# Patient Record
Sex: Male | Born: 1955
Health system: Southern US, Community
[De-identification: ages and names within clinical notes are randomized; demographics above are authoritative.]

## PROBLEM LIST (undated history)

## (undated) DIAGNOSIS — Z950 Presence of cardiac pacemaker: Secondary | ICD-10-CM

## (undated) DIAGNOSIS — Z953 Presence of xenogenic heart valve: Secondary | ICD-10-CM

## (undated) DIAGNOSIS — R911 Solitary pulmonary nodule: Secondary | ICD-10-CM

## (undated) DIAGNOSIS — Z8042 Family history of malignant neoplasm of prostate: Secondary | ICD-10-CM

## (undated) DIAGNOSIS — I5042 Chronic combined systolic (congestive) and diastolic (congestive) heart failure: Secondary | ICD-10-CM

## (undated) DIAGNOSIS — I442 Atrioventricular block, complete: Secondary | ICD-10-CM

## (undated) DIAGNOSIS — M1990 Unspecified osteoarthritis, unspecified site: Secondary | ICD-10-CM

## (undated) DIAGNOSIS — Z951 Presence of aortocoronary bypass graft: Secondary | ICD-10-CM

## (undated) DIAGNOSIS — I4510 Unspecified right bundle-branch block: Secondary | ICD-10-CM

## (undated) DIAGNOSIS — Z8052 Family history of malignant neoplasm of bladder: Secondary | ICD-10-CM

## (undated) DIAGNOSIS — R0600 Dyspnea, unspecified: Secondary | ICD-10-CM

## (undated) DIAGNOSIS — I4892 Unspecified atrial flutter: Secondary | ICD-10-CM

## (undated) DIAGNOSIS — I10 Essential (primary) hypertension: Secondary | ICD-10-CM

## (undated) DIAGNOSIS — I4891 Unspecified atrial fibrillation: Secondary | ICD-10-CM

## (undated) DIAGNOSIS — I493 Ventricular premature depolarization: Secondary | ICD-10-CM

## (undated) DIAGNOSIS — I2510 Atherosclerotic heart disease of native coronary artery without angina pectoris: Secondary | ICD-10-CM

## (undated) DIAGNOSIS — R7989 Other specified abnormal findings of blood chemistry: Secondary | ICD-10-CM

## (undated) DIAGNOSIS — Z923 Personal history of irradiation: Secondary | ICD-10-CM

## (undated) DIAGNOSIS — C8190 Hodgkin lymphoma, unspecified, unspecified site: Secondary | ICD-10-CM

## (undated) DIAGNOSIS — Z800 Family history of malignant neoplasm of digestive organs: Secondary | ICD-10-CM

## (undated) DIAGNOSIS — E039 Hypothyroidism, unspecified: Secondary | ICD-10-CM

## (undated) DIAGNOSIS — I340 Nonrheumatic mitral (valve) insufficiency: Secondary | ICD-10-CM

## (undated) HISTORY — DX: Unspecified right bundle-branch block: I45.10

## (undated) HISTORY — DX: Personal history of irradiation: Z92.3

## (undated) HISTORY — DX: Atrioventricular block, complete: I44.2

## (undated) HISTORY — DX: Family history of malignant neoplasm of prostate: Z80.42

## (undated) HISTORY — DX: Family history of malignant neoplasm of bladder: Z80.52

## (undated) HISTORY — DX: Family history of malignant neoplasm of digestive organs: Z80.0

## (undated) HISTORY — DX: Solitary pulmonary nodule: R91.1

## (undated) HISTORY — DX: Nonrheumatic mitral (valve) insufficiency: I34.0

## (undated) HISTORY — DX: Hodgkin lymphoma, unspecified, unspecified site: C81.90

---
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
## 1994-02-10 HISTORY — PX: LUMBAR LAMINECTOMY: SHX95

## 1998-03-02 ENCOUNTER — Ambulatory Visit (HOSPITAL_COMMUNITY): Admission: RE | Admit: 1998-03-02 | Discharge: 1998-03-02 | Payer: Self-pay | Admitting: Urology

## 2000-02-11 DIAGNOSIS — Z923 Personal history of irradiation: Secondary | ICD-10-CM

## 2000-02-11 DIAGNOSIS — C8190 Hodgkin lymphoma, unspecified, unspecified site: Secondary | ICD-10-CM

## 2000-02-11 HISTORY — DX: Hodgkin lymphoma, unspecified, unspecified site: C81.90

## 2000-02-11 HISTORY — DX: Personal history of irradiation: Z92.3

## 2000-04-23 ENCOUNTER — Ambulatory Visit (HOSPITAL_COMMUNITY): Admission: RE | Admit: 2000-04-23 | Discharge: 2000-04-23 | Payer: Self-pay | Admitting: General Surgery

## 2000-04-24 ENCOUNTER — Ambulatory Visit (HOSPITAL_COMMUNITY): Admission: RE | Admit: 2000-04-24 | Discharge: 2000-04-24 | Payer: Self-pay | Admitting: *Deleted

## 2000-04-24 ENCOUNTER — Encounter (INDEPENDENT_AMBULATORY_CARE_PROVIDER_SITE_OTHER): Payer: Self-pay | Admitting: Specialist

## 2000-04-27 ENCOUNTER — Ambulatory Visit (HOSPITAL_COMMUNITY): Admission: RE | Admit: 2000-04-27 | Discharge: 2000-04-27 | Payer: Self-pay | Admitting: *Deleted

## 2000-04-27 ENCOUNTER — Encounter: Payer: Self-pay | Admitting: *Deleted

## 2000-04-28 ENCOUNTER — Ambulatory Visit (HOSPITAL_COMMUNITY): Admission: RE | Admit: 2000-04-28 | Discharge: 2000-04-28 | Payer: Self-pay | Admitting: *Deleted

## 2000-04-28 ENCOUNTER — Encounter: Payer: Self-pay | Admitting: *Deleted

## 2000-04-30 ENCOUNTER — Encounter: Admission: RE | Admit: 2000-04-30 | Discharge: 2000-07-29 | Payer: Self-pay | Admitting: Radiation Oncology

## 2000-08-12 ENCOUNTER — Ambulatory Visit (HOSPITAL_COMMUNITY): Admission: RE | Admit: 2000-08-12 | Discharge: 2000-08-12 | Payer: Self-pay | Admitting: Hematology & Oncology

## 2000-08-12 ENCOUNTER — Encounter: Payer: Self-pay | Admitting: Hematology & Oncology

## 2000-11-05 ENCOUNTER — Ambulatory Visit: Admission: RE | Admit: 2000-11-05 | Discharge: 2001-02-03 | Payer: Self-pay | Admitting: Radiation Oncology

## 2000-11-06 ENCOUNTER — Ambulatory Visit (HOSPITAL_COMMUNITY): Admission: RE | Admit: 2000-11-06 | Discharge: 2000-11-06 | Payer: Self-pay | Admitting: Radiation Oncology

## 2001-03-19 ENCOUNTER — Ambulatory Visit (HOSPITAL_BASED_OUTPATIENT_CLINIC_OR_DEPARTMENT_OTHER): Admission: RE | Admit: 2001-03-19 | Discharge: 2001-03-19 | Payer: Self-pay | Admitting: General Surgery

## 2004-01-15 ENCOUNTER — Ambulatory Visit (HOSPITAL_COMMUNITY): Admission: RE | Admit: 2004-01-15 | Discharge: 2004-01-15 | Payer: Self-pay | Admitting: Hematology & Oncology

## 2004-01-24 ENCOUNTER — Ambulatory Visit: Payer: Self-pay | Admitting: Hematology & Oncology

## 2004-08-28 ENCOUNTER — Ambulatory Visit: Payer: Self-pay | Admitting: Hematology & Oncology

## 2005-02-13 ENCOUNTER — Ambulatory Visit: Payer: Self-pay | Admitting: Hematology & Oncology

## 2005-07-09 ENCOUNTER — Ambulatory Visit: Payer: Self-pay | Admitting: Internal Medicine

## 2006-02-09 ENCOUNTER — Ambulatory Visit: Payer: Self-pay | Admitting: Hematology & Oncology

## 2006-02-13 LAB — COMPREHENSIVE METABOLIC PANEL
Albumin: 4.7 g/dL (ref 3.5–5.2)
Alkaline Phosphatase: 64 U/L (ref 39–117)
BUN: 16 mg/dL (ref 6–23)
Glucose, Bld: 76 mg/dL (ref 70–99)
Potassium: 4.8 mEq/L (ref 3.5–5.3)
Total Bilirubin: 0.6 mg/dL (ref 0.3–1.2)

## 2006-02-13 LAB — CBC WITH DIFFERENTIAL/PLATELET
Basophils Absolute: 0.1 10*3/uL (ref 0.0–0.1)
Eosinophils Absolute: 0.1 10*3/uL (ref 0.0–0.5)
HCT: 42 % (ref 38.7–49.9)
HGB: 14.6 g/dL (ref 13.0–17.1)
LYMPH%: 13.9 % — ABNORMAL LOW (ref 14.0–48.0)
MCV: 93.7 fL (ref 81.6–98.0)
MONO%: 5.5 % (ref 0.0–13.0)
NEUT#: 6.3 10*3/uL (ref 1.5–6.5)
NEUT%: 78.4 % — ABNORMAL HIGH (ref 40.0–75.0)
Platelets: 336 10*3/uL (ref 145–400)

## 2006-11-04 ENCOUNTER — Ambulatory Visit: Payer: Self-pay | Admitting: Internal Medicine

## 2006-11-04 DIAGNOSIS — Z8571 Personal history of Hodgkin lymphoma: Secondary | ICD-10-CM | POA: Insufficient documentation

## 2006-11-05 LAB — CONVERTED CEMR LAB
Cholesterol: 170 mg/dL (ref 0–200)
HDL: 25.9 mg/dL — ABNORMAL LOW (ref >39.0)
LDL Cholesterol: 116 mg/dL — ABNORMAL HIGH (ref 0–99)
Total CHOL/HDL Ratio: 6.6
Triglycerides: 139 mg/dL (ref 0–149)
VLDL: 28 mg/dL (ref 0–40)

## 2007-02-09 ENCOUNTER — Ambulatory Visit: Payer: Self-pay | Admitting: Family Medicine

## 2007-02-09 DIAGNOSIS — J018 Other acute sinusitis: Secondary | ICD-10-CM | POA: Insufficient documentation

## 2007-02-17 ENCOUNTER — Ambulatory Visit: Payer: Self-pay | Admitting: Hematology & Oncology

## 2007-03-12 ENCOUNTER — Encounter: Payer: Self-pay | Admitting: Internal Medicine

## 2007-03-12 LAB — CBC WITH DIFFERENTIAL/PLATELET
Basophils Absolute: 0 10*3/uL (ref 0.0–0.1)
Eosinophils Absolute: 0.2 10*3/uL (ref 0.0–0.5)
HCT: 42.7 % (ref 38.7–49.9)
HGB: 15.1 g/dL (ref 13.0–17.1)
LYMPH%: 18.8 % (ref 14.0–48.0)
MCV: 89.3 fL (ref 81.6–98.0)
MONO%: 4.2 % (ref 0.0–13.0)
NEUT#: 4.5 10*3/uL (ref 1.5–6.5)
Platelets: 279 10*3/uL (ref 145–400)

## 2007-03-12 LAB — COMPREHENSIVE METABOLIC PANEL
Albumin: 4.7 g/dL (ref 3.5–5.2)
Alkaline Phosphatase: 64 U/L (ref 39–117)
BUN: 12 mg/dL (ref 6–23)
Glucose, Bld: 120 mg/dL — ABNORMAL HIGH (ref 70–99)
Potassium: 3.9 mEq/L (ref 3.5–5.3)

## 2007-03-12 LAB — LIPID PANEL
Cholesterol: 159 mg/dL (ref 0–200)
Total CHOL/HDL Ratio: 4.8 Ratio

## 2007-03-12 LAB — TSH: TSH: 4.861 u[IU]/mL (ref 0.350–5.500)

## 2008-02-17 ENCOUNTER — Ambulatory Visit: Payer: Self-pay | Admitting: Internal Medicine

## 2008-02-17 DIAGNOSIS — J069 Acute upper respiratory infection, unspecified: Secondary | ICD-10-CM | POA: Insufficient documentation

## 2008-03-02 ENCOUNTER — Ambulatory Visit: Payer: Self-pay | Admitting: Hematology & Oncology

## 2008-03-17 ENCOUNTER — Encounter: Payer: Self-pay | Admitting: Internal Medicine

## 2008-03-17 LAB — LIPID PANEL: Cholesterol: 129 mg/dL (ref 0–200)

## 2008-03-17 LAB — CBC WITH DIFFERENTIAL (CANCER CENTER ONLY)
BASO%: 0.7 % (ref 0.0–2.0)
EOS%: 3.3 % (ref 0.0–7.0)
HCT: 40.4 % (ref 38.7–49.9)
LYMPH%: 17.6 % (ref 14.0–48.0)
MCHC: 33.8 g/dL (ref 32.0–35.9)
MCV: 92 fL (ref 82–98)
MONO#: 0.3 10*3/uL (ref 0.1–0.9)
NEUT%: 73.8 % (ref 40.0–80.0)
Platelets: 292 10*3/uL (ref 145–400)
RDW: 12 % (ref 10.5–14.6)
WBC: 6.7 10*3/uL (ref 4.0–10.0)

## 2008-03-17 LAB — COMPREHENSIVE METABOLIC PANEL
ALT: 23 U/L (ref 0–53)
Albumin: 4.4 g/dL (ref 3.5–5.2)
CO2: 25 mEq/L (ref 19–32)
Calcium: 9.2 mg/dL (ref 8.4–10.5)
Chloride: 104 mEq/L (ref 96–112)
Creatinine, Ser: 1.01 mg/dL (ref 0.40–1.50)
Potassium: 4.4 mEq/L (ref 3.5–5.3)

## 2008-03-17 LAB — PSA: PSA: 0.76 ng/mL (ref 0.10–4.00)

## 2008-03-17 LAB — TSH: TSH: 5.064 u[IU]/mL — ABNORMAL HIGH (ref 0.350–4.500)

## 2008-05-26 ENCOUNTER — Ambulatory Visit (HOSPITAL_COMMUNITY): Admission: RE | Admit: 2008-05-26 | Discharge: 2008-05-26 | Payer: Self-pay | Admitting: Hematology & Oncology

## 2008-10-05 ENCOUNTER — Telehealth: Payer: Self-pay | Admitting: Internal Medicine

## 2008-10-13 ENCOUNTER — Ambulatory Visit: Payer: Self-pay | Admitting: Gastroenterology

## 2008-11-06 ENCOUNTER — Encounter: Payer: Self-pay | Admitting: Gastroenterology

## 2008-11-06 ENCOUNTER — Ambulatory Visit: Payer: Self-pay | Admitting: Gastroenterology

## 2008-11-06 LAB — HM COLONOSCOPY

## 2008-11-07 ENCOUNTER — Encounter: Payer: Self-pay | Admitting: Gastroenterology

## 2009-03-15 ENCOUNTER — Ambulatory Visit: Payer: Self-pay | Admitting: Hematology & Oncology

## 2009-03-16 ENCOUNTER — Encounter: Payer: Self-pay | Admitting: Internal Medicine

## 2009-03-16 LAB — CBC WITH DIFFERENTIAL (CANCER CENTER ONLY)
BASO%: 0.9 % (ref 0.0–2.0)
EOS%: 3.1 % (ref 0.0–7.0)
HCT: 44.8 % (ref 38.7–49.9)
LYMPH%: 25 % (ref 14.0–48.0)
MCH: 31.7 pg (ref 28.0–33.4)
MCHC: 34.2 g/dL (ref 32.0–35.9)
MCV: 93 fL (ref 82–98)
MONO%: 7 % (ref 0.0–13.0)
NEUT%: 64 % (ref 40.0–80.0)
RDW: 11.9 % (ref 10.5–14.6)

## 2009-03-16 LAB — COMPREHENSIVE METABOLIC PANEL
ALT: 29 U/L (ref 0–53)
CO2: 25 mEq/L (ref 19–32)
Creatinine, Ser: 0.99 mg/dL (ref 0.40–1.50)
Total Bilirubin: 0.7 mg/dL (ref 0.3–1.2)

## 2009-03-16 LAB — TSH: TSH: 9.351 u[IU]/mL — ABNORMAL HIGH (ref 0.350–4.500)

## 2009-03-16 LAB — LIPID PANEL
HDL: 32 mg/dL — ABNORMAL LOW (ref >39)
LDL Cholesterol: 70 mg/dL (ref 0–99)
Triglycerides: 207 mg/dL — ABNORMAL HIGH (ref <150)

## 2010-03-03 ENCOUNTER — Encounter: Payer: Self-pay | Admitting: Hematology & Oncology

## 2010-03-14 NOTE — Letter (Signed)
[facility]   Imported By: Maryln Gottron 04/05/2009 14:56:38  _____________________________________________________________________  External Attachment:    Type:   Image     Comment:   External Document

## 2010-05-06 ENCOUNTER — Telehealth: Payer: Self-pay | Admitting: Internal Medicine

## 2010-05-06 MED ORDER — TRAZODONE HCL 50 MG PO TABS: ORAL_TABLET | ORAL | Status: DC

## 2010-05-06 NOTE — Telephone Encounter (Signed)
OK to RF #[id number]

## 2010-05-06 NOTE — Telephone Encounter (Signed)
Please advise 

## 2010-05-06 NOTE — Telephone Encounter (Signed)
Has cpx on 05-27-2010 and would like a new rx for Trazadone to be called in to [facility]. Psyche dr had rx'd this and he no longer sees that dr.

## 2010-05-20 ENCOUNTER — Other Ambulatory Visit (INDEPENDENT_AMBULATORY_CARE_PROVIDER_SITE_OTHER): Payer: BLUE CROSS/BLUE SHIELD | Admitting: Internal Medicine

## 2010-05-20 DIAGNOSIS — Z0000 Encounter for general adult medical examination without abnormal findings: Secondary | ICD-10-CM

## 2010-05-20 LAB — POCT URINALYSIS DIPSTICK
Glucose, UA: NEGATIVE
Ketones, UA: NEGATIVE
Leukocytes, UA: NEGATIVE
Spec Grav, UA: 1.02
Urobilinogen, UA: 0.2

## 2010-05-20 LAB — CBC WITH DIFFERENTIAL/PLATELET
Basophils Relative: 0.3 % (ref 0.0–3.0)
Eosinophils Relative: 1.6 % (ref 0.0–5.0)
HCT: 43.4 % (ref 39.0–52.0)
Hemoglobin: 15.2 g/dL (ref 13.0–17.0)
Lymphs Abs: 1.1 10*3/uL (ref 0.7–4.0)
MCV: 94.5 fl (ref 78.0–100.0)
Monocytes Absolute: 0.3 10*3/uL (ref 0.1–1.0)
Monocytes Relative: 4.3 % (ref 3.0–12.0)
Neutro Abs: 6.2 10*3/uL (ref 1.4–7.7)
WBC: 7.7 10*3/uL (ref 4.5–10.5)

## 2010-05-20 LAB — HEPATIC FUNCTION PANEL
Albumin: 4.2 g/dL (ref 3.5–5.2)
Total Protein: 6.6 g/dL (ref 6.0–8.3)

## 2010-05-20 LAB — LIPID PANEL
HDL: 32.7 mg/dL — ABNORMAL LOW (ref >39.00)
Triglycerides: 158 mg/dL — ABNORMAL HIGH (ref 0.0–149.0)

## 2010-05-20 LAB — BASIC METABOLIC PANEL
BUN: 14 mg/dL (ref 6–23)
CO2: 28 mEq/L (ref 19–32)
GFR: 79.97 mL/min (ref >60.00)

## 2010-05-22 LAB — GLUCOSE, CAPILLARY: Glucose-Capillary: 101 mg/dL — ABNORMAL HIGH (ref 70–99)

## 2010-05-24 ENCOUNTER — Encounter: Payer: Self-pay | Admitting: Internal Medicine

## 2010-05-27 ENCOUNTER — Ambulatory Visit (INDEPENDENT_AMBULATORY_CARE_PROVIDER_SITE_OTHER): Payer: BLUE CROSS/BLUE SHIELD | Admitting: Internal Medicine

## 2010-05-27 ENCOUNTER — Encounter: Payer: Self-pay | Admitting: Internal Medicine

## 2010-05-27 DIAGNOSIS — R011 Cardiac murmur, unspecified: Secondary | ICD-10-CM | POA: Insufficient documentation

## 2010-05-27 DIAGNOSIS — Z0000 Encounter for general adult medical examination without abnormal findings: Secondary | ICD-10-CM

## 2010-05-27 DIAGNOSIS — E895 Postprocedural testicular hypofunction: Secondary | ICD-10-CM

## 2010-05-27 LAB — TESTOSTERONE: Testosterone: 227.84 ng/dL — ABNORMAL LOW (ref 350.00–890.00)

## 2010-05-27 MED ORDER — TRAZODONE HCL 50 MG PO TABS: ORAL_TABLET | ORAL | Status: DC

## 2010-05-27 MED ORDER — TESTOSTERONE 50 MG/5GM (1%) TD GEL: 5.0000 g | Freq: Every day | TRANSDERMAL | Status: DC

## 2010-05-27 NOTE — Patient Instructions (Signed)
Limit your sodium (Salt) intake    It is important that you exercise regularly, at least 20 minutes 3 to 4 times per week.  If you develop chest pain or shortness of breath seek  medical attention.  Return in one year for follow-up   

## 2010-05-27 NOTE — Progress Notes (Signed)
Subjective:    Patient ID: Tyler Hendrix, male    DOB: [birth year], 55 y.o.   MRN: [id number]  HPI  55 year old gentleman who has remote history of Hodgkin's disease. This was initially diagnosed over 10 years ago and he has been released by oncology. He has a history of a slightly impaired serum testosterone level. Very little in the way of ADD or libido issues. 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 He has a history of mild dyslipidemia with a depressed HDL    Review of Systems  Constitutional: Negative for fever, chills, activity change, appetite change and fatigue.  HENT: Negative for hearing loss, ear pain, congestion, rhinorrhea, sneezing, mouth sores, trouble swallowing, neck pain, neck stiffness, dental problem, voice change, sinus pressure and tinnitus.   Eyes: Negative for photophobia, pain, redness and visual disturbance.  Respiratory: Negative for apnea, cough, choking, chest tightness, shortness of breath and wheezing.   Cardiovascular: Negative for chest pain, palpitations and leg swelling.  Gastrointestinal: Negative for nausea, vomiting, abdominal pain, diarrhea, constipation, blood in stool, abdominal distention, anal bleeding and rectal pain.  Genitourinary: Negative for dysuria, urgency, frequency, hematuria, flank pain, decreased urine volume, discharge, penile swelling, scrotal swelling, difficulty urinating, genital sores and testicular pain.  Musculoskeletal: Negative for myalgias, back pain, joint swelling, arthralgias and gait problem.  Skin: Negative for color change, rash and wound.  Neurological: Negative for dizziness, tremors, seizures, syncope, facial asymmetry, speech difficulty, weakness, light-headedness, numbness and headaches.  Hematological: Negative for adenopathy. Does not bruise/bleed easily.  Psychiatric/Behavioral: Negative for suicidal ideas, hallucinations, behavioral problems, confusion, sleep  disturbance, self-injury, dysphoric mood, decreased concentration and agitation. The patient is not nervous/anxious.        Objective:   Physical Exam  Constitutional: He appears well-developed and well-nourished.  HENT:  Head: Normocephalic and atraumatic.  Right Ear: External ear normal.  Left Ear: External ear normal.  Nose: Nose normal.  Mouth/Throat: Oropharynx is clear and moist.  Eyes: Conjunctivae and EOM are normal. Pupils are equal, round, and reactive to light. No scleral icterus.  Neck: Normal range of motion. Neck supple. No JVD present. No thyromegaly present.  Cardiovascular: Normal rate, regular rhythm and intact distal pulses.  Exam reveals no gallop and no friction rub.   Murmur heard.      Grade 2/6 systolic murmur loudest  at the primary aortic area with radiation to the right supraclavicular area  Pulmonary/Chest: Effort normal and breath sounds normal. He exhibits no tenderness.  Abdominal: Soft. Bowel sounds are normal. He exhibits no distension and no mass. There is no tenderness.  Genitourinary: Prostate normal and penis normal.  Musculoskeletal: Normal range of motion. He exhibits no edema and no tenderness.  Lymphadenopathy:    He has no cervical adenopathy.  Neurological: He is alert. He has normal reflexes. No cranial nerve deficit. Coordination normal.  Skin: Skin is warm and dry. No rash noted.  Psychiatric: He has a normal mood and affect. His behavior is normal.          Assessment & Plan:   Annual clinical examination History of Hodgkin's disease clinically stable Systolic murmur. Will check a 2-D echocardiogram Mild dyslipidemia. Aerobic exercise regimen encouraged

## 2010-05-28 ENCOUNTER — Telehealth: Payer: Self-pay

## 2010-05-28 NOTE — Telephone Encounter (Signed)
Please start pre auth for androgel - dx low testoserone

## 2010-05-28 NOTE — Telephone Encounter (Signed)
Spoke with pt about low test

## 2010-05-28 NOTE — Progress Notes (Signed)
Quick Note:  Spoke with pt - informed of lab - will start pre auth ppwk for androgel ______

## 2010-06-07 ENCOUNTER — Ambulatory Visit (HOSPITAL_COMMUNITY): Payer: BC Managed Care – PPO | Attending: Internal Medicine | Admitting: Radiology

## 2010-06-07 DIAGNOSIS — R011 Cardiac murmur, unspecified: Secondary | ICD-10-CM | POA: Insufficient documentation

## 2010-06-10 ENCOUNTER — Other Ambulatory Visit: Payer: Self-pay

## 2010-06-10 MED ORDER — TESTOSTERONE 50 MG/5GM (1%) TD GEL: 5.0000 g | Freq: Every day | TRANSDERMAL | Status: DC

## 2010-06-10 NOTE — Telephone Encounter (Signed)
Called into [facility]. KIK

## 2010-06-10 NOTE — Progress Notes (Signed)
Quick Note:  Spoke with pt about results - doing fine. KIK ______

## 2010-06-28 NOTE — Op Note (Signed)
[place]. [facility]  Patient:    Tyler Hendrix, Tyler Hendrix Visit Number: [id number] MRN: [id number]          Service Type: DSU Location: [facility] Attending Physician:  Tempie Donning Dictated by:   Gita Kudo, M.D. Proc. Date: 03/19/01 Admit Date:  03/19/2001 Discharge Date: 03/19/2001   CC:         Gordy Savers, M.D. [facility] R. Myna Hidalgo, M.D.   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  PROCEDURE:  Removal of indwelling venous access system.  SURGEON:  Gita Kudo, M.D.  ANESTHESIA:  MAC, IV sedation, local 1% Xylocaine.  INDICATIONS:  This is a 55 year old man who had a left neck biopsy by myself showing lymphoma and then placement of a Port-A-Cath for chemotherapy.  He has completed his radiation and chemotherapy and is ready for the catheter to be removed.  OPERATIVE FINDINGS:  The catheter was removed intact.  There were no complications.  DESCRIPTION OF PROCEDURE:  Under satisfactory intravenous sedation, the patient was placed in the proper position and prepped and draped in a standard fashion.  1% Xylocaine was infiltrated.  The previous transverse incision was reopened down to the pocket containing the Port-A-Cath.  Then careful dissection was used to cut the sutures and also to dissect the tissue that had grown in through the holes in the flange.  When finished, the reservoir was free, and the patient was placed in the upright position and the catheter and attached reservoir removed intact without any complications.  The wound was made hemostatic by cautery and lavaged with saline.  It was closed in layers of 3-0 Vicryl and Steri-Strips.  Sterile absorbant compressive dressing applied, and the patient went to the recovery room from the operating room in good condition  without complications. Dictated by:   Gita Kudo, M.D. Attending Physician:  Tempie Donning DD:  03/19/01 TD:  03/22/01 Job: [id number]

## 2010-06-28 NOTE — Op Note (Signed)
[place]. [facility]  Patient:    Tyler Hendrix, Tyler Hendrix                     MRN: [id number] Proc. Date: 04/24/00 Adm. Date:  [id number] Attending:  Craig Staggers CC:         Gordy Savers, M.D.  Dolan Amen, M.D.   Operative Report  PREOPERATIVE DIAGNOSIS:  Left neck masses, possible lymphoma.  POSTOPERATIVE DIAGNOSIS:  Left neck masses, possible lymphoma.  PROCEDURE:  Excisional biopsy, left neck lymph nodes.  SURGEON:  Gita Kudo, M.D.  ANESTHESIA:  MAC.  CLINICAL SUMMARY:  A 55 year old male with self-noted swelling in his neck of approximately three weeks duration.  A chest x-ray showed no definite abnormality but CT scan consistent with lymphoma.  Physical examination showed markedly abnormal, matted nodes in the left neck.  OPERATIVE FINDINGS:  The patient had firm, rubbery but not rock-hard nodes that were matted together in the left neck.  Clinically they appeared like lymphoma.  I removed two very large nodes, each at least 3 cm in size.  Many more were felt but not removed.  DESCRIPTION OF PROCEDURE:  Under satisfactory general anesthesia, the patient was positioned and prepped and draped in the standard fashion.  A transverse incision was made in a skin crease, somewhat obliquely, and then carried down through the platysma and superficial fascia.  Good exposure was obtained using self-retaining retractors and careful dissection sharply, with the Bovie, and by finger dissection allowed me to free up a large lymph node.  It was excised using cautery for good hemostasis.  It was placed in a saline sponge and sent immediately to pathology after notifying that department.  After that was sent, another lymph node, deeper to this, was removed in a similar fashion. Care was taken not to injure any large vessels or nodes.  Bleeding was controlled by cautery and then the wound closed in layers with interrupted 3-0 and 4-0  Vicryl, followed by Steri-Strips for skin.  Marcaine 20 cc was infiltrated for postoperative analgesia.  There were no complications, and he went to the recovery room in good condition. DD:  04/24/00 TD:  04/25/00 Job: [id number]

## 2011-01-09 ENCOUNTER — Other Ambulatory Visit: Payer: Self-pay | Admitting: Internal Medicine

## 2011-01-10 ENCOUNTER — Other Ambulatory Visit: Payer: Self-pay

## 2011-01-10 MED ORDER — TESTOSTERONE 50 MG/5GM (1%) TD GEL: 5.0000 g | Freq: Every day | TRANSDERMAL | Status: DC

## 2011-10-17 ENCOUNTER — Encounter: Payer: Self-pay | Admitting: Internal Medicine

## 2011-10-17 ENCOUNTER — Ambulatory Visit (INDEPENDENT_AMBULATORY_CARE_PROVIDER_SITE_OTHER): Payer: BC Managed Care – PPO | Admitting: Internal Medicine

## 2011-10-17 VITALS — BP 122/84 | Temp 98.7°F | Wt 187.0 lb

## 2011-10-17 DIAGNOSIS — Z0000 Encounter for general adult medical examination without abnormal findings: Secondary | ICD-10-CM

## 2011-10-17 DIAGNOSIS — Z8571 Personal history of Hodgkin lymphoma: Secondary | ICD-10-CM

## 2011-10-17 LAB — CBC WITH DIFFERENTIAL/PLATELET
Eosinophils Relative: 1.4 % (ref 0.0–5.0)
Lymphocytes Relative: 11.9 % — ABNORMAL LOW (ref 12.0–46.0)
MCV: 94.6 fl (ref 78.0–100.0)
Monocytes Absolute: 0.5 10*3/uL (ref 0.1–1.0)
Monocytes Relative: 6.2 % (ref 3.0–12.0)
Neutrophils Relative %: 80.2 % — ABNORMAL HIGH (ref 43.0–77.0)
Platelets: 279 10*3/uL (ref 150.0–400.0)
RBC: 4.49 Mil/uL (ref 4.22–5.81)
WBC: 8.6 10*3/uL (ref 4.5–10.5)

## 2011-10-17 LAB — PSA: PSA: 0.98 ng/mL (ref 0.10–4.00)

## 2011-10-17 LAB — TSH: TSH: 4.82 u[IU]/mL (ref 0.35–5.50)

## 2011-10-17 LAB — COMPREHENSIVE METABOLIC PANEL
Albumin: 4.2 g/dL (ref 3.5–5.2)
BUN: 14 mg/dL (ref 6–23)
CO2: 28 mEq/L (ref 19–32)
Calcium: 9.5 mg/dL (ref 8.4–10.5)
Chloride: 104 mEq/L (ref 96–112)
GFR: 74.52 mL/min (ref >60.00)
Glucose, Bld: 94 mg/dL (ref 70–99)
Potassium: 4.8 mEq/L (ref 3.5–5.1)
Sodium: 141 mEq/L (ref 135–145)
Total Protein: 6.9 g/dL (ref 6.0–8.3)

## 2011-10-17 MED ORDER — TESTOSTERONE 50 MG/5GM (1%) TD GEL: 5.0000 g | Freq: Every day | TRANSDERMAL | Status: DC

## 2011-10-17 MED ORDER — TRAZODONE HCL 50 MG PO TABS: 50.0000 mg | ORAL_TABLET | Freq: Every day | ORAL | Status: DC

## 2011-10-17 NOTE — Patient Instructions (Signed)
It is important that you exercise regularly, at least 20 minutes 3 to 4 times per week.  If you develop chest pain or shortness of breath seek  medical attention.  Return in one year for follow-up   

## 2011-10-17 NOTE — Progress Notes (Signed)
Subjective:    Patient ID: Tyler Hendrix, male    DOB: 1955/06/18, 56 y.o.   MRN: [id number]  HPI  56 year old patient who is seen today for followup. He has a remote history of Hodgkin's  Disease and has been in remission for a number of years. He was released by his oncologist at least 3 years ago. Had a normal PET scan in 2010. He is seen here today basically for some medication refills. He has a mild cold symptoms are improving. No constitutional complaints. He generally feels well Past Medical History  Diagnosis Date  . Hodgkin disease     History   Social History  . Marital Status: Married    Spouse Name: N/A    Number of Children: N/A  . Years of Education: N/A   Occupational History  . Not on file.   Social History Main Topics  . Smoking status: Never Smoker   . Smokeless tobacco: Never Used  . Alcohol Use: Yes  . Drug Use: No  . Sexually Active: Not on file   Other Topics Concern  . Not on file   Social History Narrative  . No narrative on file    Past Surgical History  Procedure Date  . Lumbar laminectomy   . Knee surgery     Family History  Problem Relation Age of Onset  . Cancer      prostate father hx    No Known Allergies  Current Outpatient Prescriptions on File Prior to Visit  Medication Sig Dispense Refill  . testosterone (ANDROGEL) 50 MG/5GM GEL Place 5 g onto the skin daily.  90 Package  4  . traZODone (DESYREL) 50 MG tablet TAKE 1/2 TABLET AS NEEDED  90 tablet  0    BP 122/84  Temp 98.7 F (37.1 C) (Oral)  Wt 187 lb (84.823 kg)        Review of Systems  Constitutional: Negative for fever, chills, appetite change and fatigue.  HENT: Positive for congestion. Negative for hearing loss, ear pain, sore throat, trouble swallowing, neck stiffness, dental problem, voice change and tinnitus.   Eyes: Negative for pain, discharge and visual disturbance.  Respiratory: Positive for cough. Negative for chest tightness, wheezing and  stridor.   Cardiovascular: Negative for chest pain, palpitations and leg swelling.  Gastrointestinal: Negative for nausea, vomiting, abdominal pain, diarrhea, constipation, blood in stool and abdominal distention.  Genitourinary: Negative for urgency, hematuria, flank pain, discharge, difficulty urinating and genital sores.  Musculoskeletal: Negative for myalgias, back pain, joint swelling, arthralgias and gait problem.  Skin: Negative for rash.  Neurological: Negative for dizziness, syncope, speech difficulty, weakness, numbness and headaches.  Hematological: Negative for adenopathy. Does not bruise/bleed easily.  Psychiatric/Behavioral: Negative for behavioral problems and dysphoric mood. The patient is not nervous/anxious.        Objective:   Physical Exam  Constitutional: He is oriented to person, place, and time. He appears well-developed.  HENT:  Head: Normocephalic.  Right Ear: External ear normal.  Left Ear: External ear normal.  Eyes: Conjunctivae and EOM are normal.  Neck: Normal range of motion.  Cardiovascular: Normal rate and normal heart sounds.   Pulmonary/Chest: Breath sounds normal.  Abdominal: Bowel sounds are normal.  Musculoskeletal: Normal range of motion. He exhibits no edema and no tenderness.  Neurological: He is alert and oriented to person, place, and time.  Psychiatric: He has a normal mood and affect. His behavior is normal.  Assessment & Plan:  History of Hodgkin's  disease. Remains in clinical remission we'll check updated labs Testosterone deficiency  Recheck 1 year or as needed

## 2011-10-20 NOTE — Progress Notes (Signed)
Quick Note:  Attempt to call- Vm - LMTCB if questions - labs stable ______ 

## 2011-12-23 ENCOUNTER — Ambulatory Visit (INDEPENDENT_AMBULATORY_CARE_PROVIDER_SITE_OTHER): Payer: BC Managed Care – PPO | Admitting: Internal Medicine

## 2011-12-23 DIAGNOSIS — Z23 Encounter for immunization: Secondary | ICD-10-CM

## 2012-05-25 ENCOUNTER — Other Ambulatory Visit: Payer: Self-pay | Admitting: Internal Medicine

## 2012-10-19 ENCOUNTER — Other Ambulatory Visit: Payer: Self-pay | Admitting: Internal Medicine

## 2012-10-30 ENCOUNTER — Other Ambulatory Visit: Payer: Self-pay | Admitting: Internal Medicine

## 2012-11-08 ENCOUNTER — Ambulatory Visit (INDEPENDENT_AMBULATORY_CARE_PROVIDER_SITE_OTHER): Payer: BC Managed Care – PPO | Admitting: *Deleted

## 2012-11-08 DIAGNOSIS — Z23 Encounter for immunization: Secondary | ICD-10-CM

## 2012-11-08 DIAGNOSIS — Z2911 Encounter for prophylactic immunotherapy for respiratory syncytial virus (RSV): Secondary | ICD-10-CM

## 2013-01-31 ENCOUNTER — Other Ambulatory Visit: Payer: Self-pay | Admitting: Internal Medicine

## 2013-05-02 ENCOUNTER — Other Ambulatory Visit: Payer: Self-pay | Admitting: Internal Medicine

## 2013-05-10 ENCOUNTER — Other Ambulatory Visit: Payer: Self-pay | Admitting: Internal Medicine

## 2013-05-11 ENCOUNTER — Telehealth: Payer: Self-pay | Admitting: Internal Medicine

## 2013-05-11 NOTE — Telephone Encounter (Signed)
Pt is out of ANDROGEL 50 MG/5GM GEL Pt has made an appt for 4/21. Can you refill until then? [facility]

## 2013-05-12 ENCOUNTER — Other Ambulatory Visit: Payer: Self-pay | Admitting: Internal Medicine

## 2013-05-12 NOTE — Telephone Encounter (Signed)
Please RF

## 2013-05-12 NOTE — Telephone Encounter (Signed)
Spoke to pt told him Rx for Androgel was faxed to pharmacy. Pt verbalized understanding. Rx faxed.

## 2013-05-12 NOTE — Telephone Encounter (Signed)
Okay to refill, pt last seen 2013, but has an appointment 4/21.

## 2013-05-30 ENCOUNTER — Ambulatory Visit: Payer: BC Managed Care – PPO | Admitting: Internal Medicine

## 2013-05-31 ENCOUNTER — Ambulatory Visit (INDEPENDENT_AMBULATORY_CARE_PROVIDER_SITE_OTHER): Payer: BC Managed Care – PPO | Admitting: Internal Medicine

## 2013-05-31 ENCOUNTER — Encounter: Payer: Self-pay | Admitting: Internal Medicine

## 2013-05-31 VITALS — BP 140/94 | HR 77 | Temp 98.5°F | Resp 20 | Ht 70.75 in | Wt 195.0 lb

## 2013-05-31 DIAGNOSIS — Z8571 Personal history of Hodgkin lymphoma: Secondary | ICD-10-CM

## 2013-05-31 MED ORDER — TESTOSTERONE 50 MG/5GM (1%) TD GEL: TRANSDERMAL | Status: DC

## 2013-05-31 MED ORDER — TRAZODONE HCL 50 MG PO TABS: 50.0000 mg | ORAL_TABLET | Freq: Every day | ORAL | Status: DC

## 2013-05-31 NOTE — Patient Instructions (Signed)
Limit your sodium (Salt) intake    It is important that you exercise regularly, at least 20 minutes 3 to 4 times per week.  If you develop chest pain or shortness of breath seek  medical attention.  Please check your blood pressure on a regular basis.  If it is consistently greater than 150/90, please make an office appointment.  Return in 3 months for follow-up   

## 2013-05-31 NOTE — Progress Notes (Signed)
Subjective:    Patient ID: Tyler Hendrix, male    DOB: 1955/06/22, 58 y.o.   MRN: [id number]  HPI  58 year old patient who is seen today in followup.  He has a remote history of Hodgkin's disease, status post chemotherapy and RT.  It has been in remission for some time.  He has some chemotherapy therapy associated testosterone deficiency and has been on the topical replacement therapy.  Doing quite well today.  No new concerns or complaints.  Past Medical History  Diagnosis Date  . Hodgkin disease     History   Social History  . Marital Status: Married    Spouse Name: N/A    Number of Children: N/A  . Years of Education: N/A   Occupational History  . Not on file.   Social History Main Topics  . Smoking status: Never Smoker   . Smokeless tobacco: Never Used  . Alcohol Use: 4.2 oz/week    7 Glasses of wine per week  . Drug Use: No  . Sexual Activity: Not on file   Other Topics Concern  . Not on file   Social History Narrative  . No narrative on file    Past Surgical History  Procedure Laterality Date  . Lumbar laminectomy    . Knee surgery      Family History  Problem Relation Age of Onset  . Cancer      prostate father hx    No Known Allergies  No current outpatient prescriptions on file prior to visit.   No current facility-administered medications on file prior to visit.    BP 140/94  Pulse 77  Temp(Src) 98.5 F (36.9 C) (Oral)  Resp 20  Ht 5' 10.75" (1.797 m)  Wt 195 lb (88.451 kg)  BMI 27.39 kg/m2  SpO2 98%       Review of Systems  Constitutional: Negative for fever, chills, appetite change and fatigue.  HENT: Negative for congestion, dental problem, ear pain, hearing loss, sore throat, tinnitus, trouble swallowing and voice change.   Eyes: Negative for pain, discharge and visual disturbance.  Respiratory: Negative for cough, chest tightness, wheezing and stridor.   Cardiovascular: Negative for chest pain, palpitations and leg  swelling.  Gastrointestinal: Negative for nausea, vomiting, abdominal pain, diarrhea, constipation, blood in stool and abdominal distention.  Genitourinary: Negative for urgency, hematuria, flank pain, discharge, difficulty urinating and genital sores.  Musculoskeletal: Negative for arthralgias, back pain, gait problem, joint swelling, myalgias and neck stiffness.  Skin: Negative for rash.  Neurological: Negative for dizziness, syncope, speech difficulty, weakness, numbness and headaches.  Hematological: Negative for adenopathy. Does not bruise/bleed easily.  Psychiatric/Behavioral: Negative for behavioral problems and dysphoric mood. The patient is not nervous/anxious.        Objective:   Physical Exam  Constitutional: He is oriented to person, place, and time. He appears well-developed.  HENT:  Head: Normocephalic.  Right Ear: External ear normal.  Left Ear: External ear normal.  Eyes: Conjunctivae and EOM are normal.  Neck: Normal range of motion.  Cardiovascular: Normal rate and normal heart sounds.   Pulmonary/Chest: Breath sounds normal.  Abdominal: Bowel sounds are normal.  Musculoskeletal: Normal range of motion. He exhibits no edema and no tenderness.  Neurological: He is alert and oriented to person, place, and time.  Psychiatric: He has a normal mood and affect. His behavior is normal.          Assessment & Plan:   History of Hodgkin's disease.  Clinically  stable Testosterone deficiency.  Continue supplementation  Schedule CPX he

## 2013-05-31 NOTE — Progress Notes (Signed)
Pre-visit discussion using our clinic review tool. No additional management support is needed unless otherwise documented below in the visit note.  

## 2013-09-28 ENCOUNTER — Encounter: Payer: Self-pay | Admitting: Gastroenterology

## 2013-10-12 ENCOUNTER — Telehealth: Payer: Self-pay | Admitting: Internal Medicine

## 2013-10-12 DIAGNOSIS — R9431 Abnormal electrocardiogram [ECG] [EKG]: Secondary | ICD-10-CM

## 2013-10-12 NOTE — Telephone Encounter (Signed)
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 Pt has bcbs.

## 2013-10-13 NOTE — Telephone Encounter (Signed)
ok 

## 2013-10-13 NOTE — Telephone Encounter (Signed)
Spoke to pt, told him order for referral to Cardiology was done and someone will be in contact with him. Pt verbalized understanding.

## 2013-10-13 NOTE — Telephone Encounter (Signed)
Please see message, okay to send Cardiology referral?

## 2013-10-26 ENCOUNTER — Ambulatory Visit (INDEPENDENT_AMBULATORY_CARE_PROVIDER_SITE_OTHER): Payer: BC Managed Care – PPO | Admitting: Cardiovascular Disease

## 2013-10-26 ENCOUNTER — Encounter: Payer: Self-pay | Admitting: Cardiovascular Disease

## 2013-10-26 VITALS — BP 134/86 | HR 84 | Ht 70.75 in | Wt 196.0 lb

## 2013-10-26 DIAGNOSIS — C8319 Mantle cell lymphoma, extranodal and solid organ sites: Secondary | ICD-10-CM

## 2013-10-26 DIAGNOSIS — C8310 Mantle cell lymphoma, unspecified site: Secondary | ICD-10-CM

## 2013-10-26 DIAGNOSIS — I4510 Unspecified right bundle-branch block: Secondary | ICD-10-CM

## 2013-10-26 NOTE — Progress Notes (Addendum)
[name] Date of Birth  1955-12-01       [facility]    [place] [address]  [address] [place], [place]  [address]   [place], [place]  [address] [phone]     [phone]   Fax  [phone]     Fax [phone]  Problem List: 1. RBBB 2. Hx of Hodgkins disease - s/p mantle radiation, age 58.  3.   History of Present Illness:  Brett Canales is a 58 yo , hx of Hodgkins lymphona.  Received mantle radiation. Was told that he had a heart murmur  In 2012, echo was essentially normal.   Was recently found to have a RBBB.  Very active,  Has been working out - running several times a week.   Goes to the gym - situps and weights   No CP or dyspnea.   Non smoker Drinks wine several times a week.    Works as a Clinical research associate.  Lives in [place].    Current Outpatient Prescriptions on File Prior to Visit  Medication Sig Dispense Refill  . testosterone (ANDROGEL) 50 MG/5GM (1%) GEL APPLY 5 GRAMS ON SKIN ONCE DAILY  150 g  1  . traZODone (DESYREL) 50 MG tablet Take 1 tablet (50 mg total) by mouth at bedtime.  90 tablet  3   No current facility-administered medications on file prior to visit.    No Known Allergies  Past Medical History  Diagnosis Date  . Hodgkin disease     Past Surgical History  Procedure Laterality Date  . Lumbar laminectomy    . Knee surgery      History  Smoking status  . Never Smoker   Smokeless tobacco  . Never Used    History  Alcohol Use  . 4.2 oz/week  . 7 Glasses of wine per week    Family History  Problem Relation Age of Onset  . Cancer      prostate father hx    Reviw of Systems:  Reviewed in the HPI.  All other systems are negative.  Physical Exam: Blood pressure 134/86, pulse 84, height 5' 10.75" (1.797 m), weight 196 lb (88.905 kg). Wt Readings from Last 3 Encounters:  10/26/13 196 lb (88.905 kg)  05/31/13 195 lb (88.451 kg)  10/17/11 187 lb (84.823 kg)     General:  Well developed, well nourished, in no acute distress.  Head: Normocephalic, atraumatic, sclera non-icteric, mucus membranes are moist,   Neck: Supple. Carotids are 2 + without bruits. No JVD   Lungs: Clear   Heart: RR, normal S1S2.   Abdomen: Soft, non-tender, non-distended with normal bowel sounds.  Msk:  Strength and tone are normal   Extremities: No clubbing or cyanosis. No edema.  Distal pedal pulses are 2+ and equal    Neuro: CN II - XII intact.  Alert and oriented X 3.   Psych:  Normal   ECG: Sept. 16,2015:  NSR, at 84.  RBBB with Left axis deviation.     Assessment / Plan:

## 2013-10-26 NOTE — Patient Instructions (Signed)
Your physician has requested that you have an echocardiogram. Echocardiography is a painless test that uses sound waves to create images of your heart. It provides your doctor with information about the size and shape of your heart and how well your heart's chambers and valves are working. This procedure takes approximately one hour. There are no restrictions for this procedure.  Your physician recommends that you schedule a follow-up appointment in: as needed with Dr. Acie Fredrickson

## 2013-10-26 NOTE — Assessment & Plan Note (Signed)
  Tyler Hendrix presents for further evaluation of his RBBB.  He is completely asymptomatic.  He has a hx of Hodgkins  lymphoma and had had mantle irradiation.  He had an echo in 2012 that showed normal LV function.  No evidence of pericardial thickening or calcification.  Will repeat his echo to ensure that he does not have any late effects of his radiation.    I will see him on an as needed basis.

## 2013-11-04 ENCOUNTER — Ambulatory Visit (HOSPITAL_COMMUNITY): Payer: BC Managed Care – PPO | Attending: Cardiovascular Disease | Admitting: Radiology

## 2013-11-04 DIAGNOSIS — Z923 Personal history of irradiation: Secondary | ICD-10-CM | POA: Diagnosis not present

## 2013-11-04 DIAGNOSIS — C8310 Mantle cell lymphoma, unspecified site: Secondary | ICD-10-CM

## 2013-11-04 DIAGNOSIS — I4510 Unspecified right bundle-branch block: Secondary | ICD-10-CM

## 2013-11-04 DIAGNOSIS — C8190 Hodgkin lymphoma, unspecified, unspecified site: Secondary | ICD-10-CM | POA: Diagnosis not present

## 2013-11-04 DIAGNOSIS — I452 Bifascicular block: Secondary | ICD-10-CM | POA: Diagnosis not present

## 2013-11-04 NOTE — Progress Notes (Signed)
Echocardiogram performed.  

## 2013-11-10 ENCOUNTER — Encounter: Payer: Self-pay | Admitting: Gastroenterology

## 2013-12-14 ENCOUNTER — Telehealth: Payer: Self-pay | Admitting: Internal Medicine

## 2013-12-14 MED ORDER — TESTOSTERONE 50 MG/5GM (1%) TD GEL: TRANSDERMAL | Status: DC

## 2013-12-14 NOTE — Telephone Encounter (Signed)
[place] [id number] - [place], [place] - [address] AT [facility] [address] is requesting re-fill on testosterone (ANDROGEL) 50 MG/5GM (1%) GEL

## 2013-12-14 NOTE — Telephone Encounter (Signed)
Rx faxed

## 2014-01-20 ENCOUNTER — Encounter: Payer: BC Managed Care – PPO | Admitting: Gastroenterology

## 2014-02-01 ENCOUNTER — Encounter: Payer: BC Managed Care – PPO | Admitting: Gastroenterology

## 2014-02-08 ENCOUNTER — Ambulatory Visit (INDEPENDENT_AMBULATORY_CARE_PROVIDER_SITE_OTHER): Payer: BC Managed Care – PPO | Admitting: Family

## 2014-02-08 ENCOUNTER — Encounter: Payer: Self-pay | Admitting: Family

## 2014-02-08 VITALS — BP 120/80 | HR 74 | Temp 98.5°F | Wt 195.0 lb

## 2014-02-08 DIAGNOSIS — B9689 Other specified bacterial agents as the cause of diseases classified elsewhere: Secondary | ICD-10-CM

## 2014-02-08 DIAGNOSIS — J019 Acute sinusitis, unspecified: Secondary | ICD-10-CM

## 2014-02-08 MED ORDER — FLUTICASONE PROPIONATE 50 MCG/ACT NA SUSP: 2.0000 | Freq: Every day | NASAL | Status: DC

## 2014-02-08 MED ORDER — AZITHROMYCIN 250 MG PO TABS: ORAL_TABLET | ORAL | Status: DC

## 2014-02-08 NOTE — Patient Instructions (Signed)

## 2014-02-08 NOTE — Progress Notes (Signed)
Pre visit review using our clinic review tool, if applicable. No additional management support is needed unless otherwise documented below in the visit note. 

## 2014-02-08 NOTE — Progress Notes (Signed)
Subjective:    Patient ID: Tyler Hendrix, male    DOB: Jun 11, 1955, 58 y.o.   MRN: [id number]  HPI 58 year old white male, nonsmoker is in today with complaints of cough, congestion green phlegm, fatigue 1 week. Symptoms originally started about a week and a half ago and was better with NyQuil and DayQuil but symptoms are worse today. Denies any fever or chills.   Review of Systems  Constitutional: Negative.  Negative for fever and fatigue.  HENT: Positive for congestion, postnasal drip and sneezing.   Respiratory: Positive for cough.   Cardiovascular: Negative.   Endocrine: Negative.   Musculoskeletal: Negative.   Skin: Negative.   Allergic/Immunologic: Negative.   Hematological: Negative.   Psychiatric/Behavioral: Negative.    Past Medical History  Diagnosis Date  . Hodgkin disease     History   Social History  . Marital Status: Married    Spouse Name: N/A    Number of Children: N/A  . Years of Education: N/A   Occupational History  . Not on file.   Social History Main Topics  . Smoking status: Never Smoker   . Smokeless tobacco: Never Used  . Alcohol Use: 4.2 oz/week    7 Glasses of wine per week  . Drug Use: No  . Sexual Activity: Not on file   Other Topics Concern  . Not on file   Social History Narrative    Past Surgical History  Procedure Laterality Date  . Lumbar laminectomy    . Knee surgery      Family History  Problem Relation Age of Onset  . Cancer      prostate father hx    No Known Allergies  Current Outpatient Prescriptions on File Prior to Visit  Medication Sig Dispense Refill  . testosterone (ANDROGEL) 50 MG/5GM (1%) GEL APPLY 5 GRAMS ON SKIN ONCE DAILY 150 g 1  . traZODone (DESYREL) 50 MG tablet Take 1 tablet (50 mg total) by mouth at bedtime. 90 tablet 3   No current facility-administered medications on file prior to visit.    BP 120/80 mmHg  Pulse 74  Temp(Src) 98.5 F (36.9 C) (Oral)  Wt 195 lb (88.451 kg)chart      Objective:   Physical Exam  Constitutional: He is oriented to person, place, and time. He appears well-developed and well-nourished.  HENT:  Right Ear: External ear normal.  Left Ear: External ear normal.  Nose: Nose normal.  Mouth/Throat: Oropharynx is clear and moist.  Maxillary sinus tenderness to palpation.  Neck: Normal range of motion. Neck supple.  Cardiovascular: Normal rate, regular rhythm and normal heart sounds.   Pulmonary/Chest: Effort normal and breath sounds normal.  Musculoskeletal: Normal range of motion.  Neurological: He is alert and oriented to person, place, and time.  Skin: Skin is warm and dry.  Psychiatric: He has a normal mood and affect.          Assessment & Plan:  Dederick was seen today for sinusitis.  Diagnoses and associated orders for this visit:  Acute bacterial sinusitis  Other Orders - fluticasone (FLONASE) 50 MCG/ACT nasal spray; Place 2 sprays into both nostrils daily. - azithromycin (ZITHROMAX) 250 MG tablet; As directed.    Call the office if symptoms worsen or persist. Recheck as scheduled and as needed.

## 2014-03-09 ENCOUNTER — Telehealth: Payer: Self-pay | Admitting: Internal Medicine

## 2014-03-09 NOTE — Telephone Encounter (Signed)
I received a PA request for Androgel.  Patient's last testosterone level was in 2012.  I need more recent levels in order to submit PA request and possibly obtain an approval. Please advise.

## 2014-03-09 NOTE — Telephone Encounter (Signed)
Please call pt and schedule follow up for Testosterone was suppose to come back in 3 months for Physical. Please schedule Physical.

## 2014-03-10 ENCOUNTER — Other Ambulatory Visit: Payer: Self-pay | Admitting: Internal Medicine

## 2014-03-10 DIAGNOSIS — E895 Postprocedural testicular hypofunction: Secondary | ICD-10-CM

## 2014-03-10 NOTE — Telephone Encounter (Signed)
Okay to order Testosterone level?

## 2014-03-10 NOTE — Telephone Encounter (Signed)
Patient is scheduled for CPX in March .  He would like to know if he can come have his testosterone level check now??

## 2014-03-10 NOTE — Telephone Encounter (Signed)
ok 

## 2014-03-10 NOTE — Telephone Encounter (Signed)
Spoke to pt, told him order for Testosterone was put in EPIC, just need to call an make a lab appt. Pt verbalized understanding.

## 2014-03-22 ENCOUNTER — Other Ambulatory Visit: Payer: Self-pay

## 2014-03-23 ENCOUNTER — Other Ambulatory Visit (INDEPENDENT_AMBULATORY_CARE_PROVIDER_SITE_OTHER): Payer: BLUE CROSS/BLUE SHIELD

## 2014-03-23 DIAGNOSIS — E895 Postprocedural testicular hypofunction: Secondary | ICD-10-CM

## 2014-04-06 ENCOUNTER — Telehealth: Payer: Self-pay | Admitting: Internal Medicine

## 2014-04-06 MED ORDER — TESTOSTERONE 50 MG/5GM (1%) TD GEL: TRANSDERMAL | Status: DC

## 2014-04-06 NOTE — Telephone Encounter (Signed)
Spoke to pt told him Testosterone level was normal and to continue present dose of Androgel per Dr. Raliegh Ip. Rx sent to pharmacy. Pt verbalized understanding.

## 2014-04-06 NOTE — Telephone Encounter (Signed)
Notify patient that free testosterone level is normal and continue present dose of AndroGel.  Please call in a new prescription

## 2014-04-06 NOTE — Telephone Encounter (Signed)
Pt needs bloodwork results and refill on androgel call into [facility]

## 2014-04-06 NOTE — Telephone Encounter (Signed)
Please see result scanned in chart and advise.

## 2014-04-12 ENCOUNTER — Encounter: Payer: Self-pay | Admitting: Gastroenterology

## 2014-04-20 ENCOUNTER — Encounter: Payer: Self-pay | Admitting: Family Medicine

## 2014-04-20 ENCOUNTER — Other Ambulatory Visit (INDEPENDENT_AMBULATORY_CARE_PROVIDER_SITE_OTHER): Payer: BLUE CROSS/BLUE SHIELD

## 2014-04-20 ENCOUNTER — Ambulatory Visit (INDEPENDENT_AMBULATORY_CARE_PROVIDER_SITE_OTHER): Payer: BLUE CROSS/BLUE SHIELD | Admitting: Family Medicine

## 2014-04-20 VITALS — BP 132/84 | HR 81 | Temp 98.4°F | Wt 192.0 lb

## 2014-04-20 DIAGNOSIS — J069 Acute upper respiratory infection, unspecified: Secondary | ICD-10-CM

## 2014-04-20 DIAGNOSIS — Z0000 Encounter for general adult medical examination without abnormal findings: Secondary | ICD-10-CM

## 2014-04-20 LAB — COMPREHENSIVE METABOLIC PANEL
ALBUMIN: 4.4 g/dL (ref 3.5–5.2)
ALT: 21 U/L (ref 0–53)
AST: 16 U/L (ref 0–37)
Alkaline Phosphatase: 59 U/L (ref 39–117)
BILIRUBIN TOTAL: 0.6 mg/dL (ref 0.2–1.2)
BUN: 15 mg/dL (ref 6–23)
CHLORIDE: 104 meq/L (ref 96–112)
CO2: 32 mEq/L (ref 19–32)
Calcium: 9.4 mg/dL (ref 8.4–10.5)
Creatinine, Ser: 1.06 mg/dL (ref 0.40–1.50)
GFR: 76.27 mL/min (ref >60.00)
Glucose, Bld: 87 mg/dL (ref 70–99)
Potassium: 4.4 mEq/L (ref 3.5–5.1)
SODIUM: 140 meq/L (ref 135–145)
TOTAL PROTEIN: 6.7 g/dL (ref 6.0–8.3)

## 2014-04-20 LAB — CBC WITH DIFFERENTIAL/PLATELET
BASOS ABS: 0 10*3/uL (ref 0.0–0.1)
BASOS PCT: 0.3 % (ref 0.0–3.0)
Eosinophils Absolute: 0.2 10*3/uL (ref 0.0–0.7)
Eosinophils Relative: 2.2 % (ref 0.0–5.0)
HEMATOCRIT: 43.5 % (ref 39.0–52.0)
Hemoglobin: 15.2 g/dL (ref 13.0–17.0)
LYMPHS ABS: 0.9 10*3/uL (ref 0.7–4.0)
LYMPHS PCT: 9.8 % — AB (ref 12.0–46.0)
MCHC: 34.9 g/dL (ref 30.0–36.0)
MCV: 91.1 fl (ref 78.0–100.0)
MONOS PCT: 6.1 % (ref 3.0–12.0)
Monocytes Absolute: 0.6 10*3/uL (ref 0.1–1.0)
NEUTROS PCT: 81.6 % — AB (ref 43.0–77.0)
Neutro Abs: 7.7 10*3/uL (ref 1.4–7.7)
Platelets: 254 10*3/uL (ref 150.0–400.0)
RBC: 4.77 Mil/uL (ref 4.22–5.81)
RDW: 13.1 % (ref 11.5–15.5)
WBC: 9.4 10*3/uL (ref 4.0–10.5)

## 2014-04-20 LAB — TSH: TSH: 4.76 u[IU]/mL — ABNORMAL HIGH (ref 0.35–4.50)

## 2014-04-20 LAB — LIPID PANEL
CHOLESTEROL: 146 mg/dL (ref 0–200)
HDL: 33.5 mg/dL — AB (ref >39.00)
LDL CALC: 74 mg/dL (ref 0–99)
NonHDL: 112.5
Total CHOL/HDL Ratio: 4
Triglycerides: 191 mg/dL — ABNORMAL HIGH (ref 0.0–149.0)
VLDL: 38.2 mg/dL (ref 0.0–40.0)

## 2014-04-20 LAB — POCT URINALYSIS DIPSTICK
BILIRUBIN UA: NEGATIVE
Blood, UA: NEGATIVE
GLUCOSE UA: NEGATIVE
Ketones, UA: NEGATIVE
LEUKOCYTES UA: NEGATIVE
Nitrite, UA: NEGATIVE
Protein, UA: NEGATIVE
Spec Grav, UA: 1.015
UROBILINOGEN UA: 0.2
pH, UA: 5.5

## 2014-04-20 LAB — PSA: PSA: 1.32 ng/mL (ref 0.10–4.00)

## 2014-04-20 MED ORDER — TESTOSTERONE 50 MG/5GM (1%) TD GEL: TRANSDERMAL | Status: DC

## 2014-04-20 MED ORDER — AZITHROMYCIN 250 MG PO TABS: ORAL_TABLET | ORAL | Status: AC

## 2014-04-20 NOTE — Progress Notes (Signed)
Pre visit review using our clinic review tool, if applicable. No additional management support is needed unless otherwise documented below in the visit note. 

## 2014-04-20 NOTE — Progress Notes (Signed)
Subjective:    Patient ID: Tyler Hendrix, male    DOB: May 03, 1955, 59 y.o.   MRN: [id number]  HPI Acute visit. Patient seen with 2 day history of sinus congestion and cough. He's had some yellowish nasal discharge. He is concerned because he has a son being treated for minimal change disease. He has not had any fever or chills. No sore throat. No headaches. He states he has had difficulty shaking sinus infections in the past. Nonsmoker. Denies any laryngitis symptoms. No earache.  Past Medical History  Diagnosis Date  . Hodgkin disease    Past Surgical History  Procedure Laterality Date  . Lumbar laminectomy    . Knee surgery      reports that he has never smoked. He has never used smokeless tobacco. He reports that he drinks about 4.2 oz of alcohol per week. He reports that he does not use illicit drugs. family history includes Cancer in an other family member. No Known Allergies    Review of Systems  Constitutional: Negative for fever and chills.  HENT: Positive for congestion and sinus pressure.   Respiratory: Positive for cough.   Neurological: Negative for headaches.       Objective:   Physical Exam  Constitutional: He appears well-developed and well-nourished.  HENT:  Right Ear: External ear normal.  Left Ear: External ear normal.  Mouth/Throat: Oropharynx is clear and moist.  Neck: Neck supple.  Cardiovascular: Normal rate and regular rhythm.   Pulmonary/Chest: Effort normal and breath sounds normal. No respiratory distress. He has no wheezes. He has no rales.          Assessment & Plan:  URI. Suspect viral. At this point, we have not recommended any antibiotics. We printed prescription for Zithromax to start only if he has any progressive facial pain, purulent secretions, etc.

## 2014-04-20 NOTE — Patient Instructions (Signed)

## 2014-04-21 ENCOUNTER — Telehealth: Payer: Self-pay | Admitting: Internal Medicine

## 2014-04-21 ENCOUNTER — Other Ambulatory Visit: Payer: Self-pay | Admitting: *Deleted

## 2014-04-21 MED ORDER — ANDROGEL 50 MG/5GM (1%) TD GEL: 5.0000 g | Freq: Every day | TRANSDERMAL | Status: DC

## 2014-04-21 NOTE — Telephone Encounter (Signed)
Tyler Hendrix from [place] called to say that she need his testerone level and range faxed to [phone]

## 2014-04-24 NOTE — Telephone Encounter (Signed)
PA Approved

## 2014-04-27 ENCOUNTER — Encounter: Payer: Self-pay | Admitting: Internal Medicine

## 2014-04-27 ENCOUNTER — Ambulatory Visit (INDEPENDENT_AMBULATORY_CARE_PROVIDER_SITE_OTHER): Payer: BLUE CROSS/BLUE SHIELD | Admitting: Internal Medicine

## 2014-04-27 DIAGNOSIS — Z0000 Encounter for general adult medical examination without abnormal findings: Secondary | ICD-10-CM | POA: Diagnosis not present

## 2014-04-27 DIAGNOSIS — Z23 Encounter for immunization: Secondary | ICD-10-CM | POA: Diagnosis not present

## 2014-04-27 DIAGNOSIS — Z8571 Personal history of Hodgkin lymphoma: Secondary | ICD-10-CM

## 2014-04-27 DIAGNOSIS — I4510 Unspecified right bundle-branch block: Secondary | ICD-10-CM

## 2014-04-27 MED ORDER — MOMETASONE FUROATE 50 MCG/ACT NA SUSP: 2.0000 | Freq: Every day | NASAL | Status: DC

## 2014-04-27 MED ORDER — TRAZODONE HCL 50 MG PO TABS: 50.0000 mg | ORAL_TABLET | Freq: Every day | ORAL | Status: DC

## 2014-04-27 NOTE — Progress Notes (Signed)
Pre visit review using our clinic review tool, if applicable. No additional management support is needed unless otherwise documented below in the visit note. 

## 2014-04-27 NOTE — Progress Notes (Signed)
Subjective:    Patient ID: Tyler Hendrix, male    DOB: September 03, 1955, 59 y.o.   MRN: [id number]  HPI   59 year old patient who is seen today for a preventive health examination  Social history.  Married, 2 children age 7 and 84.  Wife age 76 with recent pregnancy  Family history father age 6.  History of peripheral vascular disease, prostate cancer Mother died elderly complications or medical heart disease One brother one sister in good health.  Sister with kidney stones  Past Medical History  Diagnosis Date  . Hodgkin disease     Past Medical History  Diagnosis Date  . Hodgkin disease     History   Social History  . Marital Status: Married    Spouse Name: N/A  . Number of Children: N/A  . Years of Education: N/A   Occupational History  . Not on file.   Social History Main Topics  . Smoking status: Never Smoker   . Smokeless tobacco: Never Used  . Alcohol Use: 4.2 oz/week    7 Glasses of wine per week  . Drug Use: No  . Sexual Activity: Not on file   Other Topics Concern  . Not on file   Social History Narrative    Past Surgical History  Procedure Laterality Date  . Lumbar laminectomy    . Knee surgery      Family History  Problem Relation Age of Onset  . Cancer      prostate father hx    No Known Allergies  Current Outpatient Prescriptions on File Prior to Visit  Medication Sig Dispense Refill  . ANDROGEL 50 MG/5GM (1%) GEL Place 5 g onto the skin daily. 150 g 5   No current facility-administered medications on file prior to visit.    BP 140/88 mmHg  Pulse 81  Temp(Src) 98.1 F (36.7 C) (Oral)  Resp 20  Ht 5' 10.5" (1.791 m)  Wt 193 lb (87.544 kg)  BMI 27.29 kg/m2  SpO2 98%     Review of Systems  Constitutional: Negative for fever, chills, appetite change and fatigue.  HENT: Negative for congestion, dental problem, ear pain, hearing loss, sore throat, tinnitus, trouble swallowing and voice change.   Eyes: Negative for pain,  discharge and visual disturbance.  Respiratory: Negative for cough, chest tightness, wheezing and stridor.   Cardiovascular: Negative for chest pain, palpitations and leg swelling.  Gastrointestinal: Negative for nausea, vomiting, abdominal pain, diarrhea, constipation, blood in stool and abdominal distention.  Genitourinary: Negative for urgency, hematuria, flank pain, discharge, difficulty urinating and genital sores.  Musculoskeletal: Negative for myalgias, back pain, joint swelling, arthralgias, gait problem and neck stiffness.  Skin: Negative for rash.  Neurological: Negative for dizziness, syncope, speech difficulty, weakness, numbness and headaches.  Hematological: Negative for adenopathy. Does not bruise/bleed easily.  Psychiatric/Behavioral: Negative for behavioral problems and dysphoric mood. The patient is not nervous/anxious.        Objective:   Physical Exam  Constitutional: He appears well-developed and well-nourished.  HENT:  Head: Normocephalic and atraumatic.  Right Ear: External ear normal.  Left Ear: External ear normal.  Nose: Nose normal.  Mouth/Throat: Oropharynx is clear and moist.  Eyes: Conjunctivae and EOM are normal. Pupils are equal, round, and reactive to light. No scleral icterus.  Neck: Normal range of motion. Neck supple. No JVD present. No thyromegaly present.  Cardiovascular: Regular rhythm, normal heart sounds and intact distal pulses.  Exam reveals no gallop and no friction rub.  No murmur heard. Pulmonary/Chest: Effort normal and breath sounds normal. He exhibits no tenderness.  Abdominal: Soft. Bowel sounds are normal. He exhibits no distension and no mass. There is no tenderness.  Genitourinary: Prostate normal and penis normal.  Plus 2 symmetrically enlarged  Musculoskeletal: Normal range of motion. He exhibits no edema or tenderness.  Lymphadenopathy:    He has no cervical adenopathy.  Neurological: He is alert. He has normal reflexes. No  cranial nerve deficit. Coordination normal.  Skin: Skin is warm and dry. No rash noted.  Psychiatric: He has a normal mood and affect. His behavior is normal.          Assessment & Plan:   Preventive health exam History of testosterone deficiency Remote history of Hodgkin's disease   Recheck one year or as needed

## 2014-04-27 NOTE — Patient Instructions (Signed)

## 2014-05-04 ENCOUNTER — Telehealth: Payer: Self-pay | Admitting: Gastroenterology

## 2014-05-04 NOTE — Telephone Encounter (Signed)
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  That will be scanned into epic.  We discussed 2010 colonoscopy: 4 polyps were removed, only 2 were retrieved. Those 2 were hyperplastic. I explained that since the other two polyps were not retrieved I generally assume that they could have been small adenomatous polyps and therefore recommend repeat colonoscopy at 5 year interval (2015) rather than 10 year interval.  He understands my opinion, plans to reschedule the procedure.  If upcoming colonoscopy shows no precancerous polyps, would likely recommend recall at usual, routine risk 10 year interval.  Will forward this to Dr. Burnice Logan for his review as well.

## 2014-06-14 ENCOUNTER — Telehealth: Payer: Self-pay | Admitting: Internal Medicine

## 2014-06-14 NOTE — Telephone Encounter (Signed)
Spoke with patient and he has called Dr Julious Payer office and they can not be seen until next week. He has no chest pain, nausea, or SOB. An appointment has been made for tomorrow.

## 2014-06-14 NOTE — Telephone Encounter (Signed)
[facility] - Client [place] [facility] Patient Name: Tyler Hendrix DOB: [birth year] Initial Comment Caller states husband is having chest pains, numbness in leg Nurse Assessment Nurse: Erlene Quan, RN, Manuela Schwartz Date/Time Eilene Ghazi Time): 06/14/2014 2:40:32 PM Confirm and document reason for call. If symptomatic, describe symptoms. ---Caller states husband is having chest pains, numbness in leg - states chest pain are transient - states he is not with her he is at work he called her and told her about these symptoms he is having Has the patient traveled out of the country within the last 30 days? ---Not Applicable Does the patient require triage? ---No Please document clinical information provided and list any resource used. ---Advised caller It is difficult to assess a situation when the person having symptoms is not present to answer some very specific questions we have - advised her if he is reporting chest pain and numbness on one side of the body that is sudden onset and present now he needs to call 911, if the chest pain is transient but still occurring and he is describing numbness on one side of the body then someone needs to get him to the ER as soon as possible - she verbalized understanding Guidelines Guideline Title Affirmed Question Affirmed Notes Final Disposition User Clinical Call Erlene Quan, RN, Manuela Schwartz

## 2014-06-15 ENCOUNTER — Encounter: Payer: Self-pay | Admitting: Internal Medicine

## 2014-06-15 ENCOUNTER — Ambulatory Visit: Payer: BLUE CROSS/BLUE SHIELD | Admitting: Adult Health

## 2014-06-15 ENCOUNTER — Ambulatory Visit (INDEPENDENT_AMBULATORY_CARE_PROVIDER_SITE_OTHER): Payer: BLUE CROSS/BLUE SHIELD | Admitting: Internal Medicine

## 2014-06-15 VITALS — BP 142/98 | HR 86 | Ht 70.5 in | Wt 191.1 lb

## 2014-06-15 DIAGNOSIS — I4510 Unspecified right bundle-branch block: Secondary | ICD-10-CM | POA: Diagnosis not present

## 2014-06-15 DIAGNOSIS — R002 Palpitations: Secondary | ICD-10-CM | POA: Diagnosis not present

## 2014-06-15 DIAGNOSIS — Z923 Personal history of irradiation: Secondary | ICD-10-CM | POA: Diagnosis not present

## 2014-06-15 DIAGNOSIS — R0789 Other chest pain: Secondary | ICD-10-CM | POA: Diagnosis not present

## 2014-06-15 NOTE — Progress Notes (Signed)
OFFICE NOTE  Chief Complaint:  Palpitations  Primary Care Physician: Tyler Cowden, MD  HPI:  Tyler Hendrix is a pleasant 59 year old male who was previously seen by Dr. Acie Hendrix in September of this past year. He is an add on to my schedule today for acute symptoms of palpitations. He reports over the past several days she's had skipped heartbeats and higher heart rates. He says his heart rate is typically in the 60s however recently has been in the 80s. He's also felt some skipped beats and they were happening 4-5 times a minute. He denies any chest pain or worsening shortness of breath. He's never had a stress test or any coronary disease evaluation. He does have a history of Hodgkin's lymphoma and had chest wall radiation. In September he had an echocardiogram which showed normal systolic function and it was felt that he was doing fairly well. He continued to do exercise which she has recently without any significant symptoms. 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   PMHx:  Past Medical History  Diagnosis Date  . Hodgkin disease     Past Surgical History  Procedure Laterality Date  . Lumbar laminectomy    . Knee surgery      FAMHx:  Family History  Problem Relation Age of Onset  . Cancer      prostate father hx    SOCHx:   reports that he has never smoked. He has never used smokeless tobacco. He reports that he drinks about 4.2 oz of alcohol per week. He reports that he does not use illicit drugs.  ALLERGIES:  No Known Allergies  ROS: A comprehensive review of systems was negative except for: Cardiovascular: positive for palpitations  HOME MEDS: Current Outpatient Prescriptions  Medication Sig Dispense Refill  . mometasone (NASONEX) 50 MCG/ACT nasal spray Place 2 sprays into the nose daily. (Patient taking differently: Place 2 sprays into the  nose as needed. ) 16 g 5  . Multiple Vitamin (MULTIVITAMIN) tablet Take 1 tablet by mouth daily.    Marland Kitchen testosterone (ANDROGEL) 50 MG/5GM (1%) GEL Place 5 g onto the skin 2 (two) times a week.    . traZODone (DESYREL) 50 MG tablet Take 1 tablet (50 mg total) by mouth at bedtime. 90 tablet 3   No current facility-administered medications for this visit.    LABS/IMAGING: No results found for this or any previous visit (from the past 48 hour(s)). No results found.  WEIGHTS: Wt Readings from Last 3 Encounters:  06/15/14 191 lb 1.6 oz (86.682 kg)  04/27/14 193 lb (87.544 kg)  04/20/14 192 lb (87.091 kg)    VITALS: BP 142/98 mmHg  Pulse 86  Ht 5' 10.5" (1.791 m)  Wt 191 lb 1.6 oz (86.682 kg)  BMI 27.02 kg/m2  EXAM: General appearance: alert and no distress Neck: no carotid bruit and no JVD Lungs: clear to auscultation bilaterally Heart: regular rate and rhythm, S1, S2 normal, no murmur, click, rub or gallop Abdomen: soft, non-tender; bowel sounds normal; no masses,  no organomegaly Extremities: extremities normal, atraumatic, no cyanosis or edema Pulses: 2+ and symmetric Skin: Skin color, texture, turgor normal. No rashes or lesions Neurologic: Grossly normal Psych: Pleasant  EKG: Normal sinus rhythm at 86, right bundle branch block, left anterior fascicular block  ASSESSMENT: 1. Palpitations 2. Abnormal EKG with bifascicular block-stable 3.  History of chest wall radiation for Hodgkin's lymphoma  PLAN: 1.   Tyler Hendrix is describing palpitations which sound like PVCs. Other week were not able to capture them on an EKG I was able to auscultate pauses on exam. He denies any chest pain does never had an ischemic workup and does have an abnormal EKG showing right bundle branch block and left anterior fascicular block. I would recommend an exercise nuclear stress test to evaluate for ischemia. In addition will set him up for 48 hour monitor. Given the frequency of his symptoms I  suspect will detect that he is having PVCs. Ultimately we may have to consider medication therapy such as beta blocker. I've counseled him now to try to work on stress reduction, improve sleep, and reducing caffeine in his diet.  Plan to see him back to discuss results of the studies in a few weeks.  Tyler Casino, MD, [facility] Attending Cardiologist Tyler Hendrix 06/15/2014, 4:46 PM

## 2014-06-15 NOTE — Patient Instructions (Addendum)
Your physician has requested that you have an exercise stress myoview. For further information please visit [email or web address]. Please follow instruction sheet, as given.  Your physician has recommended that you wear a holter monitor for 48 hours. Holter monitors are medical devices that record the heart's electrical activity. Doctors most often use these monitors to diagnose arrhythmias. Arrhythmias are problems with the speed or rhythm of the heartbeat. The monitor is a small, portable device. You can wear one while you do your normal daily activities. This is usually used to diagnose what is causing palpitations/syncope (passing out). >> when you finish wearing the monitor, please return it to our office  Your physician recommends that you schedule a follow-up appointment in 2 weeks with Dr. Debara Pickett (OK to double book)

## 2014-06-20 ENCOUNTER — Telehealth (HOSPITAL_COMMUNITY): Payer: Self-pay

## 2014-06-20 NOTE — Telephone Encounter (Signed)
Encounter complete. 

## 2014-06-22 ENCOUNTER — Ambulatory Visit: Payer: BLUE CROSS/BLUE SHIELD | Admitting: Cardiovascular Disease

## 2014-06-22 ENCOUNTER — Encounter (HOSPITAL_COMMUNITY): Payer: BLUE CROSS/BLUE SHIELD

## 2014-06-30 ENCOUNTER — Ambulatory Visit (INDEPENDENT_AMBULATORY_CARE_PROVIDER_SITE_OTHER): Payer: BLUE CROSS/BLUE SHIELD

## 2014-06-30 DIAGNOSIS — R0789 Other chest pain: Secondary | ICD-10-CM | POA: Diagnosis not present

## 2014-06-30 DIAGNOSIS — I4510 Unspecified right bundle-branch block: Secondary | ICD-10-CM | POA: Diagnosis not present

## 2014-06-30 DIAGNOSIS — R002 Palpitations: Secondary | ICD-10-CM

## 2014-07-05 ENCOUNTER — Ambulatory Visit: Payer: BLUE CROSS/BLUE SHIELD | Admitting: Internal Medicine

## 2014-07-21 ENCOUNTER — Telehealth (HOSPITAL_COMMUNITY): Payer: Self-pay

## 2014-07-21 NOTE — Telephone Encounter (Signed)
Encounter complete. 

## 2014-07-26 ENCOUNTER — Inpatient Hospital Stay (HOSPITAL_COMMUNITY): Admission: RE | Admit: 2014-07-26 | Payer: BLUE CROSS/BLUE SHIELD | Source: Ambulatory Visit

## 2014-08-04 ENCOUNTER — Telehealth (HOSPITAL_COMMUNITY): Payer: Self-pay

## 2014-08-04 NOTE — Telephone Encounter (Signed)
Encounter complete. 

## 2014-08-09 ENCOUNTER — Ambulatory Visit (HOSPITAL_COMMUNITY)
Admission: RE | Admit: 2014-08-09 | Discharge: 2014-08-09 | Disposition: A | Discharge status: Home / Self Care | Payer: BLUE CROSS/BLUE SHIELD | Source: Ambulatory Visit | Attending: Internal Medicine | Admitting: Internal Medicine

## 2014-08-09 ENCOUNTER — Encounter (HOSPITAL_COMMUNITY): Payer: Self-pay | Admitting: *Deleted

## 2014-08-09 DIAGNOSIS — R002 Palpitations: Secondary | ICD-10-CM

## 2014-08-09 DIAGNOSIS — R0789 Other chest pain: Secondary | ICD-10-CM

## 2014-08-09 DIAGNOSIS — Z923 Personal history of irradiation: Secondary | ICD-10-CM | POA: Diagnosis not present

## 2014-08-09 DIAGNOSIS — I4510 Unspecified right bundle-branch block: Secondary | ICD-10-CM

## 2014-08-09 DIAGNOSIS — R079 Chest pain, unspecified: Secondary | ICD-10-CM | POA: Diagnosis not present

## 2014-08-09 LAB — MYOCARDIAL PERFUSION IMAGING
CHL CUP NUCLEAR SRS: 0
CSEPPHR: 103 {beats}/min
Rest HR: 51 {beats}/min
SDS: 3
SSS: 3
TID: 0.94

## 2014-08-09 MED ORDER — TECHNETIUM TC 99M SESTAMIBI GENERIC - CARDIOLITE
30.6000 | Freq: Once | INTRAVENOUS | Status: AC | PRN
  Administered 2014-08-09: 31 via INTRAVENOUS

## 2014-08-09 MED ORDER — TECHNETIUM TC 99M SESTAMIBI GENERIC - CARDIOLITE
10.7000 | Freq: Once | INTRAVENOUS | Status: AC | PRN
  Administered 2014-08-09: 11 via INTRAVENOUS

## 2014-08-09 MED ORDER — REGADENOSON 0.4 MG/5ML IV SOLN
0.4000 mg | Freq: Once | INTRAVENOUS | Status: AC
  Administered 2014-08-09: 0.4 mg via INTRAVENOUS

## 2014-08-09 NOTE — Progress Notes (Unsigned)
Patient was changed from a Stress to a Lexiscan after showing his BP to Dr. Debara Pickett.

## 2014-08-16 ENCOUNTER — Ambulatory Visit (INDEPENDENT_AMBULATORY_CARE_PROVIDER_SITE_OTHER): Payer: BLUE CROSS/BLUE SHIELD | Admitting: Internal Medicine

## 2014-08-16 ENCOUNTER — Encounter: Payer: Self-pay | Admitting: Internal Medicine

## 2014-08-16 VITALS — BP 140/72 | HR 66 | Ht 70.0 in | Wt 191.3 lb

## 2014-08-16 DIAGNOSIS — Z8571 Personal history of Hodgkin lymphoma: Secondary | ICD-10-CM

## 2014-08-16 DIAGNOSIS — I4510 Unspecified right bundle-branch block: Secondary | ICD-10-CM | POA: Diagnosis not present

## 2014-08-16 DIAGNOSIS — Z923 Personal history of irradiation: Secondary | ICD-10-CM | POA: Diagnosis not present

## 2014-08-16 DIAGNOSIS — R002 Palpitations: Secondary | ICD-10-CM

## 2014-08-16 MED ORDER — METOPROLOL SUCCINATE ER 25 MG PO TB24: 12.5000 mg | ORAL_TABLET | Freq: Every day | ORAL | Status: DC

## 2014-08-16 MED ORDER — METOPROLOL SUCCINATE ER 25 MG PO TB24: 25.0000 mg | ORAL_TABLET | Freq: Every day | ORAL | Status: DC

## 2014-08-16 NOTE — Progress Notes (Signed)
OFFICE NOTE  Chief Complaint:  Palpitations, follow-up tests  Primary Care Physician: Nyoka Cowden, MD  HPI:  Tyler Hendrix is a pleasant 59 year old male who was previously seen by Dr. Acie Fredrickson in September of this past year. He is an add on to my schedule today for acute symptoms of palpitations. He reports over the past several days she's had skipped heartbeats and higher heart rates. He says his heart rate is typically in the 60s however recently has been in the 80s. He's also felt some skipped beats and they were happening 4-5 times a minute. He denies any chest pain or worsening shortness of breath. He's never had a stress test or any coronary disease evaluation. He does have a history of Hodgkin's lymphoma and had chest wall radiation. In September he had an echocardiogram which showed normal systolic function and it was felt that he was doing fairly well. He continued to do exercise which she has recently without any significant symptoms. 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   [name] returns today for follow-up. Overall he is feeling fairly well. He reports his palpitations have improved somewhat but he still feeling him. While wearing the monitor he was noted to have significant ventricular ectopy including isolated PVCs, and ventricular trigeminy and quadrigeminy. His nuclear stress test was negative for ischemia but was not gated. He did have an echo last fall which showed an EF of 55% which is reassuring.   PMHx:  Past Medical History  Diagnosis Date  . Hodgkin disease     Past Surgical History  Procedure Laterality Date  . Lumbar laminectomy  1996  . Knee surgery Right 1972    FAMHx:  Family History  Problem Relation Age of Onset  . Cancer      prostate father hx  . Heart disease Mother   . Heart disease Father     SOCHx:   reports that  he has never smoked. He has never used smokeless tobacco. He reports that he drinks about 4.2 oz of alcohol per week. He reports that he does not use illicit drugs.  ALLERGIES:  No Known Allergies  ROS: A comprehensive review of systems was negative except for: Cardiovascular: positive for palpitations  HOME MEDS: Current Outpatient Prescriptions  Medication Sig Dispense Refill  . Multiple Vitamin (MULTIVITAMIN) tablet Take 1 tablet by mouth daily.    Marland Kitchen testosterone (ANDROGEL) 50 MG/5GM (1%) GEL Place 5 g onto the skin 2 (two) times a week.    . traZODone (DESYREL) 50 MG tablet Take 1 tablet (50 mg total) by mouth at bedtime. 90 tablet 3  . metoprolol succinate (TOPROL-XL) 25 MG 24 hr tablet Take 0.5 tablets (12.5 mg total) by mouth daily. 45 tablet 1  . mometasone (NASONEX) 50 MCG/ACT nasal spray Place 2 sprays into the nose daily. (Patient not taking: Reported on 08/16/2014) 16 g 5   No current facility-administered medications for this visit.    LABS/IMAGING: No results found for this or any previous visit (from the past 48 hour(s)). No results found.  WEIGHTS: Wt Readings from Last 3 Encounters:  08/16/14 191 lb 4.8 oz (86.773 kg)  08/09/14 191 lb (86.637 kg)  06/15/14 191 lb 1.6 oz (86.682 kg)    VITALS: BP 140/72 mmHg  Pulse 66  Ht 5\' 10"  (1.778 m)  Wt 191 lb  4.8 oz (86.773 kg)  BMI 27.45 kg/m2  EXAM: Deferred  EKG: Normal sinus rhythm at 86, right bundle branch block, left anterior fascicular block  ASSESSMENT: 1. Palpitations - PVCs including trigeminy and quadrigeminy 2. Abnormal EKG with bifascicular block-stable 3. History of chest wall radiation for Hodgkin's lymphoma  PLAN: 1.   Mr. Ozburn indeed was noted to have PVCs on his monitor. He is symptomatic with this and I think would benefit from suppression with the beta blocker. I'm recommending he start low-dose Toprol XL 12.5 mg daily. Hopefully this will help him symptomatically. His stress test was  negative for ischemia which is reassuring. He has had a history of radiation therapy and may have some scarring infiltrative his electrical conduction system as a nidus for his PVCs. There is underlying bifascicular block. No evidence for bradycardia or pauses was noted on the monitor.  Plan to see him back in 4-6 weeks to see these had improvement on beta blocker.  Pixie Casino, MD, [facility] Attending Cardiologist [place] 08/16/2014, 6:25 PM

## 2014-08-16 NOTE — Patient Instructions (Signed)
Your physician has recommended you make the following change in your medication: START metoprolol succinate 12.5mg  once daily  Your physician recommends that you schedule a follow-up appointment in: 4-6 weeks with Dr. Debara Pickett

## 2014-10-11 ENCOUNTER — Telehealth: Payer: Self-pay | Admitting: Internal Medicine

## 2014-10-11 MED ORDER — METOPROLOL SUCCINATE ER 25 MG PO TB24: 25.0000 mg | ORAL_TABLET | Freq: Every day | ORAL | Status: DC

## 2014-10-11 NOTE — Telephone Encounter (Signed)
Pt explains Dr. Debara Pickett initially recommended 25mg  daily but pt wanted to start at half-dose and work up. His symptoms weren't diminished w/ the 12.5mg  but he voices that results improved at 25mg  daily. Requested refill to reflect needed dose. Informed patient refill sent to pharmacy of preference.

## 2014-10-11 NOTE — Telephone Encounter (Signed)
Pt needs rx for Toprol but doesn't have appt until October 19 with Dr. Debara Pickett.  His RX was for 12.5 mg but he needed to take 25 mg.  [facility] at [place].

## 2014-11-15 ENCOUNTER — Encounter: Payer: Self-pay | Admitting: Gastroenterology

## 2014-11-27 ENCOUNTER — Other Ambulatory Visit: Payer: Self-pay | Admitting: Internal Medicine

## 2014-11-27 ENCOUNTER — Telehealth: Payer: Self-pay | Admitting: Cardiovascular Disease

## 2014-11-27 NOTE — Telephone Encounter (Signed)
Received a call from Joycelyn Schmid at [place] office wanting to let Pamala Hurry know all labs in epic.

## 2014-11-29 ENCOUNTER — Ambulatory Visit (INDEPENDENT_AMBULATORY_CARE_PROVIDER_SITE_OTHER): Payer: BLUE CROSS/BLUE SHIELD | Admitting: Internal Medicine

## 2014-11-29 ENCOUNTER — Encounter: Payer: Self-pay | Admitting: Internal Medicine

## 2014-11-29 VITALS — BP 152/90 | HR 48 | Ht 71.0 in | Wt 189.2 lb

## 2014-11-29 DIAGNOSIS — R001 Bradycardia, unspecified: Secondary | ICD-10-CM

## 2014-11-29 DIAGNOSIS — R002 Palpitations: Secondary | ICD-10-CM

## 2014-11-29 DIAGNOSIS — I4510 Unspecified right bundle-branch block: Secondary | ICD-10-CM | POA: Diagnosis not present

## 2014-11-29 MED ORDER — METOPROLOL SUCCINATE ER 25 MG PO TB24: 12.5000 mg | ORAL_TABLET | Freq: Two times a day (BID) | ORAL | Status: DC

## 2014-11-29 NOTE — Patient Instructions (Signed)
Your physician wants you to follow-up in: 6 months with Dr. Debara Pickett. You will receive a reminder letter in the mail two months in advance. If you don't receive a letter, please call our office to schedule the follow-up appointment.  Dr. Debara Pickett recommends that you take METOPROLOL SUCCINATE twice daily - cut your 25mg  tablets in half  If you need a refill on your cardiac medications before your next appointment, please call your pharmacy.

## 2014-11-29 NOTE — Progress Notes (Signed)
OFFICE NOTE  Chief Complaint:  Palpitations, follow-up tests  Primary Care Physician: Nyoka Cowden, MD  HPI:  Tyler Hendrix is a pleasant 59 year old male who was previously seen by Dr. Acie Fredrickson in September of this past year. He is an add on to my schedule today for acute symptoms of palpitations. He reports over the past several days she's had skipped heartbeats and higher heart rates. He says his heart rate is typically in the 60s however recently has been in the 80s. He's also felt some skipped beats and they were happening 4-5 times a minute. He denies any chest pain or worsening shortness of breath. He's never had a stress test or any coronary disease evaluation. He does have a history of Hodgkin's lymphoma and had chest wall radiation. In September he had an echocardiogram which showed normal systolic function and it was felt that he was doing fairly well. He continued to do exercise which she has recently without any significant symptoms. 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   [name] returns today for follow-up. Overall he is feeling fairly well. He reports his palpitations have improved somewhat but he still feeling him. While wearing the monitor he was noted to have significant ventricular ectopy including isolated PVCs, and ventricular trigeminy and quadrigeminy. His nuclear stress test was negative for ischemia but was not gated. He did have an echo last fall which showed an EF of 55% which is reassuring.   At the pleasure see [name] back in the office today for follow-up. Overall he thinks he is doing much better on the high-dose of metoprolol 25 mg daily. Although he's tolerating this with very few palpitations, he still has some breakthrough in the afternoon. He generally takes the medicine in the morning. His EKG today however does show a low heart  rate of 48, with a short period to sinus rhythm and a competing junctional rhythm. I've had concerns about underlying conduction system disease based on his EKG showing a right bundle branch block and now there is some evidence of a junctional bradycardia. Despite this he has a good energy level, denies chest pain and is able to maintain normal physical activity. His wife recently had their new baby about 2 weeks ago.  PMHx:  Past Medical History  Diagnosis Date  . Hodgkin disease [facility])     Past Surgical History  Procedure Laterality Date  . Lumbar laminectomy  1996  . Knee surgery Right 1972    FAMHx:  Family History  Problem Relation Age of Onset  . Cancer      prostate father hx  . Heart disease Mother   . Heart disease Father     SOCHx:   reports that he has never smoked. He has never used smokeless tobacco. He reports that he drinks about 4.2 oz of alcohol per week. He reports that he does not use illicit drugs.  ALLERGIES:  No Known Allergies  ROS: A comprehensive review of systems was negative except for: Cardiovascular: positive for palpitations  HOME MEDS: Current Outpatient Prescriptions  Medication Sig Dispense Refill  . ANDROGEL 50 MG/5GM (1%) GEL APPLY 1 PACKET EXTERNALLY ONCE DAILY AS DIRECTED 150 g 1  . metoprolol succinate (TOPROL-XL) 25 MG 24 hr tablet Take 0.5 tablets (12.5 mg total) by mouth 2 (two) times daily. 90 tablet 3  .  Multiple Vitamin (MULTIVITAMIN) tablet Take 1 tablet by mouth daily.    . traZODone (DESYREL) 50 MG tablet Take 1 tablet (50 mg total) by mouth at bedtime. 90 tablet 3   No current facility-administered medications for this visit.    LABS/IMAGING: No results found for this or any previous visit (from the past 48 hour(s)). No results found.  WEIGHTS: Wt Readings from Last 3 Encounters:  11/29/14 189 lb 3.2 oz (85.821 kg)  08/16/14 191 lb 4.8 oz (86.773 kg)  08/09/14 191 lb (86.637 kg)    VITALS: BP 152/90 mmHg  Pulse 48   Ht 5\' 11"  (1.803 m)  Wt 189 lb 3.2 oz (85.821 kg)  BMI 26.40 kg/m2  EXAM: Deferred  EKG: Sinus bradycardia with alternating junctional bradycardia and right bundle branch block, heart rate 48  ASSESSMENT: 1. Palpitations - PVCs including trigeminy and quadrigeminy, generally improved 2. 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 History of chest wall radiation for Hodgkin's lymphoma  PLAN: 1.   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 While the beta blocker seems to be doing a good job of mitigating his PVCs, he is now bradycardic with a competing junctional rhythm. There is no room for further increases in his beta blocker. At this point he is advised to consider taking his metoprolol XL 12.5 mg twice daily versus 25 mg daily. If he can try to reduce the dose to 12.5 mg that would be more preferable. He is aware what symptoms to look for if he were to become more symptomatic with bradycardia. I think we have to have a low threshold for considering discontinuing his beta blocker if he has progressive conduction system disease. Ultimately, he may regress to needing a pacemaker. We discussed this some today and he is aware of that possibility.  Thanks for continuing to allow me to participate in his care. I'll plan to see him back in 6 months.  Pixie Casino, MD, [facility] Attending Cardiologist [place] 11/29/2014, 1:04 PM

## 2015-01-02 ENCOUNTER — Ambulatory Visit (AMBULATORY_SURGERY_CENTER): Payer: Self-pay

## 2015-01-02 VITALS — Ht 71.0 in | Wt 193.0 lb

## 2015-01-02 DIAGNOSIS — Z860100 Personal history of colon polyps, unspecified: Secondary | ICD-10-CM

## 2015-01-02 MED ORDER — SUPREP BOWEL PREP KIT 17.5-3.13-1.6 GM/177ML PO SOLN: 1.0000 | Freq: Once | ORAL | Status: DC

## 2015-01-02 NOTE — Progress Notes (Signed)
No allergies to eggs or soy No past problems with anesthesia No home oxygen No diet/weight loss meds  Has email and internet; refused emmi 

## 2015-01-16 ENCOUNTER — Ambulatory Visit (AMBULATORY_SURGERY_CENTER): Payer: BLUE CROSS/BLUE SHIELD | Admitting: Gastroenterology

## 2015-01-16 ENCOUNTER — Encounter: Payer: Self-pay | Admitting: Gastroenterology

## 2015-01-16 VITALS — BP 125/77 | HR 64 | Temp 97.7°F | Resp 24 | Ht 71.0 in | Wt 193.0 lb

## 2015-01-16 DIAGNOSIS — D123 Benign neoplasm of transverse colon: Secondary | ICD-10-CM

## 2015-01-16 DIAGNOSIS — Z86010 Personal history of colonic polyps: Secondary | ICD-10-CM

## 2015-01-16 DIAGNOSIS — D122 Benign neoplasm of ascending colon: Secondary | ICD-10-CM | POA: Diagnosis not present

## 2015-01-16 HISTORY — PX: COLONOSCOPY: SHX174

## 2015-01-16 MED ORDER — SODIUM CHLORIDE 0.9 % IV SOLN: 500.0000 mL | INTRAVENOUS | Status: DC

## 2015-01-16 NOTE — Progress Notes (Signed)
Patient awakening, vss report to rn 

## 2015-01-16 NOTE — Patient Instructions (Signed)
YOU HAD AN ENDOSCOPIC PROCEDURE TODAY AT THE [facility]:   Refer to the procedure report that was given to you for any specific questions about what was found during the examination.  If the procedure report does not answer your questions, please call your gastroenterologist to clarify.  If you requested that your care partner not be given the details of your procedure findings, then the procedure report has been included in a sealed envelope for you to review at your convenience later.  YOU SHOULD EXPECT: Some feelings of bloating in the abdomen. Passage of more gas than usual.  Walking can help get rid of the air that was put into your GI tract during the procedure and reduce the bloating. If you had a lower endoscopy (such as a colonoscopy or flexible sigmoidoscopy) you may notice spotting of blood in your stool or on the toilet paper. If you underwent a bowel prep for your procedure, you may not have a normal bowel movement for a few days.  Please Note:  You might notice some irritation and congestion in your nose or some drainage.  This is from the oxygen used during your procedure.  There is no need for concern and it should clear up in a day or so.  SYMPTOMS TO REPORT IMMEDIATELY:   Following lower endoscopy (colonoscopy or flexible sigmoidoscopy):  Excessive amounts of blood in the stool  Significant tenderness or worsening of abdominal pains  Swelling of the abdomen that is new, acute  Fever of 100F or higher   For urgent or emergent issues, a gastroenterologist can be reached at any hour by calling [phone].   DIET: Your first meal following the procedure should be a small meal and then it is ok to progress to your normal diet. Heavy or fried foods are harder to digest and may make you feel nauseous or bloated.  Likewise, meals heavy in dairy and vegetables can increase bloating.  Drink plenty of fluids but you should avoid alcoholic beverages for 24  hours.  ACTIVITY:  You should plan to take it easy for the rest of today and you should NOT DRIVE or use heavy machinery until tomorrow (because of the sedation medicines used during the test).    FOLLOW UP: Our staff will call the number listed on your records the next business day following your procedure to check on you and address any questions or concerns that you may have regarding the information given to you following your procedure. If we do not reach you, we will leave a message.  However, if you are feeling well and you are not experiencing any problems, there is no need to return our call.  We will assume that you have returned to your regular daily activities without incident.  If any biopsies were taken you will be contacted by phone or by letter within the next 1-3 weeks.  Please call us at [phone] if you have not heard about the biopsies in 3 weeks.    SIGNATURES/CONFIDENTIALITY: You and/or your care partner have signed paperwork which will be entered into your electronic medical record.  These signatures attest to the fact that that the information above on your After Visit Summary has been reviewed and is understood.  Full responsibility of the confidentiality of this discharge information lies with you and/or your care-partner. 

## 2015-01-16 NOTE — Op Note (Signed)
[facility]  [address] [place], [address]   COLONOSCOPY PROCEDURE REPORT  PATIENT: Tyler Hendrix, Tyler Hendrix  MR#: [id number] BIRTHDATE: January 10, 1956 , 51  yrs. old GENDER: male ENDOSCOPIST: Milus Banister, MD PROCEDURE DATE:  01/16/2015 PROCEDURE:   Colonoscopy, surveillance and Colonoscopy with hot biopsy/bipolar First Screening Colonoscopy - Avg.  risk and is 50 yrs.  old or older - No.  Prior Negative Screening - Now for repeat screening. N/A  History of Adenoma - Now for follow-up colonoscopy & has been > or = to 3 yrs.  N/A  Polyps removed today? Yes ASA CLASS:   Class II INDICATIONS:Surveillance due to prior colonic neoplasia and Colonoscopy 2010 Dr.  Ardis Hughs 4 polyps removed, 2 retrieved (were HPs). MEDICATIONS: Monitored anesthesia care and Propofol 300 mg IV  DESCRIPTION OF PROCEDURE:   After the risks benefits and alternatives of the procedure were thoroughly explained, informed consent was obtained.  The digital rectal exam revealed no abnormalities of the rectum.   The LB CF-H180AL Loaner [id number] endoscope was introduced through the anus and advanced to the cecum, which was identified by both the appendix and ileocecal valve. No adverse events experienced.   The quality of the prep was excellent.  The instrument was then slowly withdrawn as the colon was fully examined. Estimated blood loss is zero unless otherwise noted in this procedure report.  COLON FINDINGS: A sessile polyp measuring 2 mm in size was found in the ascending colon.  A polypectomy was performed with cold forceps.  The resection was complete, the polyp tissue was completely retrieved and sent to histology.   The examination was otherwise normal.  Retroflexed views revealed no abnormalities. The time to cecum = 2.6 Withdrawal time = 7.3   The scope was withdrawn and the procedure completed. COMPLICATIONS: There were no immediate complications.  ENDOSCOPIC IMPRESSION: 1.   Sessile  polyp was found in the ascending colon; polypectomy was performed with cold forceps 2.   The examination was otherwise normal  RECOMMENDATIONS: If the polyp(s) removed today are proven to be adenomatous (pre-cancerous) polyps, you will need a repeat colonoscopy in 5 years.  Otherwise you should continue to follow colorectal cancer screening guidelines for "routine risk" patients with colonoscopy in 10 years.  You will receive a letter within 1-2 weeks with the results of your biopsy as well as final recommendations.  Please call my office if you have not received a letter after 3 weeks.  eSigned:  Milus Banister, MD 01/16/2015 8:36 AM

## 2015-01-16 NOTE — Progress Notes (Signed)
Called to room to assist during endoscopic procedure.  Patient ID and intended procedure confirmed with present staff. Received instructions for my participation in the procedure from the performing physician.  

## 2015-01-17 ENCOUNTER — Telehealth: Payer: Self-pay | Admitting: *Deleted

## 2015-01-17 NOTE — Telephone Encounter (Signed)
  Follow up Call-  Call back number 01/16/2015  Post procedure Call Back phone  # [phone]  Permission to leave phone message Yes     Patient questions:  Do you have a fever, pain , or abdominal swelling? No. Pain Score  0 *  Have you tolerated food without any problems? Yes.    Have you been able to return to your normal activities? Yes.    Do you have any questions about your discharge instructions: Diet   No. Medications  No. Follow up visit  No.  Do you have questions or concerns about your Care? No.  Actions: * If pain score is 4 or above: No action needed, pain <4.

## 2015-01-24 ENCOUNTER — Encounter: Payer: Self-pay | Admitting: Gastroenterology

## 2015-04-16 ENCOUNTER — Other Ambulatory Visit: Payer: Self-pay | Admitting: Internal Medicine

## 2015-04-16 NOTE — Telephone Encounter (Signed)
Rx(s) sent to pharmacy electronically.  

## 2015-05-30 ENCOUNTER — Encounter: Payer: Self-pay | Admitting: Adult Health

## 2015-05-30 ENCOUNTER — Ambulatory Visit (INDEPENDENT_AMBULATORY_CARE_PROVIDER_SITE_OTHER): Payer: BLUE CROSS/BLUE SHIELD | Admitting: Adult Health

## 2015-05-30 VITALS — BP 160/80 | Temp 98.7°F | Wt 187.7 lb

## 2015-05-30 DIAGNOSIS — J209 Acute bronchitis, unspecified: Secondary | ICD-10-CM | POA: Diagnosis not present

## 2015-05-30 MED ORDER — HYDROCODONE-HOMATROPINE 5-1.5 MG/5ML PO SYRP: 5.0000 mL | ORAL_SOLUTION | Freq: Three times a day (TID) | ORAL | Status: DC | PRN

## 2015-05-30 MED ORDER — PREDNISONE 10 MG PO TABS
ORAL_TABLET | ORAL | Status: DC
Start: 2015-05-30 — End: 2015-06-22

## 2015-05-30 NOTE — Progress Notes (Signed)
Subjective:    Patient ID: Tyler Hendrix, male    DOB: [birth year], 60 y.o.   MRN: [id number]  Cough This is a new problem. The current episode started in the past 7 days. The problem has been gradually improving. The problem occurs every few minutes. The cough is productive of sputum. Associated symptoms include rhinorrhea. Pertinent negatives include no chest pain, chills, fever, headaches, nasal congestion, rash, sore throat, shortness of breath, sweats or wheezing. He has tried OTC cough suppressant and rest for the symptoms. The treatment provided mild relief. There is no history of asthma, bronchitis, COPD, environmental allergies or pneumonia.      Review of Systems  Constitutional: Negative for fever and chills.  HENT: Positive for rhinorrhea. Negative for sore throat.   Respiratory: Positive for cough. Negative for shortness of breath and wheezing.   Cardiovascular: Negative for chest pain.  Skin: Negative for rash.  Allergic/Immunologic: Negative for environmental allergies.  Neurological: Negative for headaches.  All other systems reviewed and are negative.      Objective:   Physical Exam  Constitutional: He appears well-developed and well-nourished. No distress.  HENT:  Head: Normocephalic and atraumatic.  Right Ear: Hearing, tympanic membrane, external ear and ear canal normal.  Left Ear: Hearing, tympanic membrane, external ear and ear canal normal.  Nose: Nose normal.  Mouth/Throat: Oropharynx is clear and moist. No oropharyngeal exudate.  Eyes: Conjunctivae and EOM are normal. Pupils are equal, round, and reactive to light. Right eye exhibits no discharge. Left eye exhibits no discharge. No scleral icterus.  Neck: Normal range of motion. Neck supple.  Cardiovascular: Normal rate, regular rhythm, normal heart sounds and intact distal pulses.  Exam reveals no gallop and no friction rub.   No murmur heard. Pulmonary/Chest: Effort normal and breath sounds normal. No  respiratory distress. He has no wheezes. He has no rales. He exhibits no tenderness.  Increased expiratory phase  Musculoskeletal: Normal range of motion.  Lymphadenopathy:    He has no cervical adenopathy.  Neurological: He is alert.  Skin: Skin is warm and dry. No rash noted. He is not diaphoretic. No erythema. No pallor.  Psychiatric: He has a normal mood and affect. His behavior is normal. Judgment and thought content normal.  Nursing note and vitals reviewed.     Assessment & Plan:  1. Acute bronchitis, unspecified organism - HYDROcodone-homatropine (HYCODAN) 5-1.5 MG/5ML syrup; Take 5 mLs by mouth every 8 (eight) hours as needed for cough.  Dispense: 120 mL; Refill: 0 - predniSONE (DELTASONE) 10 MG tablet; 40 mg x 3 days, 20 mg x 3 days, 10 mg x 3 days  Dispense: 21 tablet; Refill: 0 - Mucinex Cough Q12 H - Follow up if no improvement or sooner if needed.   Shirline Frees, NP

## 2015-05-30 NOTE — Patient Instructions (Addendum)
It was great meeting you today!  Your exam is consistent with bronchitis. I have sent in a prescription for Prednisone, take as directed:  40 mg x 3 days, 20 mg x 3 days, 10 mg x 3 days  Use the cough syrup as needed at night- it will make you sleepy.   Continue with Mucinex Cough  Follow up with Dr. Raliegh Ip or myself if no improvement.   Acute Bronchitis Bronchitis is inflammation of the airways that extend from the windpipe into the lungs (bronchi). The inflammation often causes mucus to develop. This leads to a cough, which is the most common symptom of bronchitis.  In acute bronchitis, the condition usually develops suddenly and goes away over time, usually in a couple weeks. Smoking, allergies, and asthma can make bronchitis worse. Repeated episodes of bronchitis may cause further lung problems.  CAUSES Acute bronchitis is most often caused by the same virus that causes a cold. The virus can spread from person to person (contagious) through coughing, sneezing, and touching contaminated objects. SIGNS AND SYMPTOMS   Cough.   Fever.   Coughing up mucus.   Body aches.   Chest congestion.   Chills.   Shortness of breath.   Sore throat.  DIAGNOSIS  Acute bronchitis is usually diagnosed through a physical exam. Your health care provider will also ask you questions about your medical history. Tests, such as chest X-rays, are sometimes done to rule out other conditions.  TREATMENT  Acute bronchitis usually goes away in a couple weeks. Oftentimes, no medical treatment is necessary. Medicines are sometimes given for relief of fever or cough. Antibiotic medicines are usually not needed but may be prescribed in certain situations. In some cases, an inhaler may be recommended to help reduce shortness of breath and control the cough. A cool mist vaporizer may also be used to help thin bronchial secretions and make it easier to clear the chest.  HOME CARE INSTRUCTIONS  Get plenty of  rest.   Drink enough fluids to keep your urine clear or pale yellow (unless you have a medical condition that requires fluid restriction). Increasing fluids may help thin your respiratory secretions (sputum) and reduce chest congestion, and it will prevent dehydration.   Take medicines only as directed by your health care provider.  If you were prescribed an antibiotic medicine, finish it all even if you start to feel better.  Avoid smoking and secondhand smoke. Exposure to cigarette smoke or irritating chemicals will make bronchitis worse. If you are a smoker, consider using nicotine gum or skin patches to help control withdrawal symptoms. Quitting smoking will help your lungs heal faster.   Reduce the chances of another bout of acute bronchitis by washing your hands frequently, avoiding people with cold symptoms, and trying not to touch your hands to your mouth, nose, or eyes.   Keep all follow-up visits as directed by your health care provider.  SEEK MEDICAL CARE IF: Your symptoms do not improve after 1 week of treatment.  SEEK IMMEDIATE MEDICAL CARE IF:  You develop an increased fever or chills.   You have chest pain.   You have severe shortness of breath.  You have bloody sputum.   You develop dehydration.  You faint or repeatedly feel like you are going to pass out.  You develop repeated vomiting.  You develop a severe headache. MAKE SURE YOU:   Understand these instructions.  Will watch your condition.  Will get help right away if you are not  doing well or get worse.   This information is not intended to replace advice given to you by your health care provider. Make sure you discuss any questions you have with your health care provider.   Document Released: 03/06/2004 Document Revised: 02/17/2014 Document Reviewed: 07/20/2012 Elsevier Interactive Patient Education [employer].

## 2015-06-22 ENCOUNTER — Encounter: Payer: Self-pay | Admitting: Internal Medicine

## 2015-06-22 ENCOUNTER — Ambulatory Visit (INDEPENDENT_AMBULATORY_CARE_PROVIDER_SITE_OTHER): Payer: BLUE CROSS/BLUE SHIELD | Admitting: Internal Medicine

## 2015-06-22 VITALS — BP 156/84 | HR 47 | Ht 70.0 in | Wt 191.0 lb

## 2015-06-22 DIAGNOSIS — R001 Bradycardia, unspecified: Secondary | ICD-10-CM | POA: Diagnosis not present

## 2015-06-22 DIAGNOSIS — I10 Essential (primary) hypertension: Secondary | ICD-10-CM | POA: Diagnosis not present

## 2015-06-22 DIAGNOSIS — I4510 Unspecified right bundle-branch block: Secondary | ICD-10-CM | POA: Diagnosis not present

## 2015-06-22 DIAGNOSIS — R002 Palpitations: Secondary | ICD-10-CM

## 2015-06-22 MED ORDER — AMLODIPINE BESYLATE 5 MG PO TABS: 5.0000 mg | ORAL_TABLET | Freq: Every day | ORAL | Status: DC

## 2015-06-22 NOTE — Patient Instructions (Signed)
Your physician recommends that you schedule a follow-up appointment in: [place] with Dr. Debara Pickett   Your physician has recommended you make the following change in your medication: START amlodipine 5mg  once daily

## 2015-06-22 NOTE — Progress Notes (Signed)
OFFICE NOTE  Chief Complaint:  Routine follow-up  Primary Care Physician: Nyoka Cowden, MD  HPI:  Tyler Hendrix is a pleasant 60 year old male who was previously seen by Dr. Acie Fredrickson in September of this past year. He is an add on to my schedule today for acute symptoms of palpitations. He reports over the past several days she's had skipped heartbeats and higher heart rates. He says his heart rate is typically in the 60s however recently has been in the 80s. He's also felt some skipped beats and they were happening 4-5 times a minute. He denies any chest pain or worsening shortness of breath. He's never had a stress test or any coronary disease evaluation. He does have a history of Hodgkin's lymphoma and had chest wall radiation. In September he had an echocardiogram which showed normal systolic function and it was felt that he was doing fairly well. He continued to do exercise which she has recently without any significant symptoms. 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   Tyler Hendrix returns today for follow-up. Overall he is feeling fairly well. He reports his palpitations have improved somewhat but he still feeling him. While wearing the monitor he was noted to have significant ventricular ectopy including isolated PVCs, and ventricular trigeminy and quadrigeminy. His nuclear stress test was negative for ischemia but was not gated. He did have an echo last fall which showed an EF of 55% which is reassuring.   At the pleasure see Tyler Hendrix back in the office today for follow-up. Overall he thinks he is doing much better on the high-dose of metoprolol 25 mg daily. Although he's tolerating this with very few palpitations, he still has some breakthrough in the afternoon. He generally takes the medicine in the morning. His EKG today however does show a low heart rate of 48,  with a short period to sinus rhythm and a competing junctional rhythm. I've had concerns about underlying conduction system disease based on his EKG showing a right bundle branch block and now there is some evidence of a junctional bradycardia. Despite this he has a good energy level, denies chest pain and is able to maintain normal physical activity. His wife recently had their new baby about 2 weeks ago.  06/22/2015  Tyler Hendrix returns today for follow-up. He reports that he is overall done fairly well. He denies any worsening shortness of breath or fatigue. Heart rate remains in the upper 40s although he has very little ventricular ectopy and his EKG today. He denies any significant palpitations. I think we found a balance currently between low-dose beta blocker and his underlying conduction disease. Should he have more symptoms however he may need antiarrhythmic medication. I do not see any features that are concerning for pacemaker at this point. Blood pressure, however remains elevated and not at goal.  PMHx:  Past Medical History  Diagnosis Date  . Hodgkin disease ([place])   . Right bundle branch block     Past Surgical History  Procedure Laterality Date  . Lumbar laminectomy  1996  . Knee surgery Right 1972    FAMHx:  Family History  Problem Relation Age of Onset  . Cancer      prostate father hx  . Heart disease Mother   . Heart disease Father   . Colon cancer Neg Hx  SOCHx:   reports that he has never smoked. He has never used smokeless tobacco. He reports that he drinks about 3.0 oz of alcohol per week. He reports that he does not use illicit drugs.  ALLERGIES:  No Known Allergies  ROS: A comprehensive review of systems was negative except for: Cardiovascular: positive for palpitations  HOME MEDS: Current Outpatient Prescriptions  Medication Sig Dispense Refill  . ANDROGEL 50 MG/5GM (1%) GEL APPLY 5 GRAMS ON SKIN ONCE DAILY 150 g 0  . metoprolol succinate  (TOPROL-XL) 25 MG 24 hr tablet Take 0.5 tablets (12.5 mg total) by mouth 2 (two) times daily. (Patient taking differently: Take 12.5 mg by mouth daily. ) 90 tablet 3  . Multiple Vitamin (MULTIVITAMIN) tablet Take 1 tablet by mouth daily.    . traZODone (DESYREL) 50 MG tablet Take 1 tablet (50 mg total) by mouth at bedtime. (Patient taking differently: Take 50 mg by mouth at bedtime. 25mg ) 90 tablet 3  . amLODipine (NORVASC) 5 MG tablet Take 1 tablet (5 mg total) by mouth daily. 30 tablet 5   No current facility-administered medications for this visit.    LABS/IMAGING: No results found for this or any previous visit (from the past 48 hour(s)). No results found.  WEIGHTS: Wt Readings from Last 3 Encounters:  06/22/15 191 lb (86.637 kg)  05/30/15 187 lb 11.2 oz (85.14 kg)  01/16/15 193 lb (87.544 kg)    VITALS: BP 156/84 mmHg  Pulse 47  Ht 5\' 10"  (1.778 m)  Wt 191 lb (86.637 kg)  BMI 27.41 kg/m2  EXAM: Deferred  EKG: Sinus bradycardia with alternating junctional bradycardia and right bundle branch block, heart rate 47  ASSESSMENT: 1. Palpitations - PVCs including trigeminy and quadrigeminy, generally improved 2. 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 History of chest wall radiation for Hodgkin's lymphoma 4. Essential hypertension  PLAN: 1.   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 While the beta blocker seems to be doing a good job of mitigating his PVCs, he is now bradycardic with a competing junctional rhythm. There is no room for further increases in his beta blocker. He seems to be asymptomatic with his current dose of beta blocker. Blood pressure is not at goal however and we will add amlodipine 5 mg daily to his regimen. Follow-up in one month and will further review his symptoms. He may ultimately need an EP referral.  Pixie Casino, MD, [facility] Attending  Cardiologist [place] 06/22/2015, 1:56 PM

## 2015-07-22 ENCOUNTER — Other Ambulatory Visit: Payer: Self-pay | Admitting: Internal Medicine

## 2015-07-23 NOTE — Telephone Encounter (Signed)
Pt is requesting a refill. Last filled on 04/17/15, pt saw [facility] on 05/30/15. Ok to refill?

## 2015-07-23 NOTE — Telephone Encounter (Signed)
ok 

## 2015-10-04 ENCOUNTER — Ambulatory Visit (INDEPENDENT_AMBULATORY_CARE_PROVIDER_SITE_OTHER): Payer: BLUE CROSS/BLUE SHIELD | Admitting: Internal Medicine

## 2015-10-04 ENCOUNTER — Encounter: Payer: Self-pay | Admitting: Internal Medicine

## 2015-10-04 VITALS — BP 142/64 | HR 44 | Ht 71.0 in | Wt 193.6 lb

## 2015-10-04 DIAGNOSIS — I442 Atrioventricular block, complete: Secondary | ICD-10-CM | POA: Diagnosis not present

## 2015-10-04 DIAGNOSIS — I10 Essential (primary) hypertension: Secondary | ICD-10-CM | POA: Diagnosis not present

## 2015-10-04 DIAGNOSIS — I493 Ventricular premature depolarization: Secondary | ICD-10-CM | POA: Diagnosis not present

## 2015-10-04 NOTE — Patient Instructions (Addendum)
Your physician has recommended you make the following change in your medication: STOP metoprolol succinate  Please monitor your BP & HR at home  Please schedule an appointment with Dr. Debara Pickett 10/05/15 @ 2:15  Dr. Debara Pickett advises that you go to the ED if you develop: chest pain, shortness of breath, if you pass out

## 2015-10-04 NOTE — Progress Notes (Signed)
OFFICE NOTE  Chief Complaint:  Very mild DOE, exercise intolerance  Primary Care Physician: Tyler Cowden, MD  HPI:  Tyler Hendrix is a pleasant 60 year old male who was previously seen by Dr. Acie Hendrix in September of this past year. He is an add on to my schedule today for acute symptoms of palpitations. He reports over the past several days she's had skipped heartbeats and higher heart rates. He says his heart rate is typically in the 60s however recently has been in the 80s. He's also felt some skipped beats and they were happening 4-5 times a minute. He denies any chest pain or worsening shortness of breath. He's never had a stress test or any coronary disease evaluation. He does have a history of Hodgkin's lymphoma and had chest wall radiation. In September he had an echocardiogram which showed normal systolic function and it was felt that he was doing fairly well. He continued to do exercise which she has recently without any significant symptoms. 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   Tyler Hendrix returns today for follow-up. Overall he is feeling fairly well. He reports his palpitations have improved somewhat but he still feeling him. While wearing the monitor he was noted to have significant ventricular ectopy including isolated PVCs, and ventricular trigeminy and quadrigeminy. His nuclear stress test was negative for ischemia but was not gated. He did have an echo last fall which showed an EF of 55% which is reassuring.   At the pleasure see Tyler Hendrix back in the office today for follow-up. Overall he thinks he is doing much better on the high-dose of metoprolol 25 mg daily. Although he's tolerating this with very few palpitations, he still has some breakthrough in the afternoon. He generally takes the medicine in the morning. His EKG today however does show a low  heart rate of 48, with a short period to sinus rhythm and a competing junctional rhythm. I've had concerns about underlying conduction system disease based on his EKG showing a right bundle branch block and now there is some evidence of a junctional bradycardia. Despite this he has a good energy level, denies chest pain and is able to maintain normal physical activity. His wife recently had their new baby about 2 weeks ago.  06/22/2015  Tyler Hendrix returns today for follow-up. He reports that he is overall done fairly well. He denies any worsening shortness of breath or fatigue. Heart rate remains in the upper 40s although he has very little ventricular ectopy and his EKG today. He denies any significant palpitations. I think we found a balance currently between low-dose beta blocker and his underlying conduction disease. Should he have more symptoms however he may need antiarrhythmic medication. I do not see any features that are concerning for pacemaker at this point. Blood pressure, however remains elevated and not at goal.  10/04/2015  Tyler Hendrix was seen back today in follow-up. He reports a marked improvement in his PVCs however over the past week he's felt slightly more short of breath with exertion. He did not voluntarily mention this yet required significant persistent questioning on my part to get an answer out of him. He also said that he might have a little pressure in his chest when he tried to exercise. He denied any pain, dizziness, presyncope or syncopal symptoms. Blood pressure is improved somewhat  after the addition of amlodipine. He's no longer having any PVCs however his EKG is abnormal today showing a new complete heart block. Heart rate is 44 with a sinus rhythm and wide QRS. There are inferior T-wave abnormalities which were previously seen on his EKG.  PMHx:  Past Medical History:  Diagnosis Date  . Hodgkin disease (Tyler Hendrix)   . Right bundle branch block     Past Surgical History:    Procedure Laterality Date  . KNEE SURGERY Right 1972  . LUMBAR LAMINECTOMY  1996    FAMHx:  Family History  Problem Relation Age of Onset  . Cancer      prostate father hx  . Heart disease Mother   . Heart disease Father   . Colon cancer Neg Hx     SOCHx:   reports that he has never smoked. He has never used smokeless tobacco. He reports that he drinks about 3.0 oz of alcohol per week . He reports that he does not use drugs.  ALLERGIES:  No Known Allergies  ROS: Pertinent items noted in HPI and remainder of comprehensive ROS otherwise negative.  HOME MEDS: Current Outpatient Prescriptions  Medication Sig Dispense Refill  . amLODipine (NORVASC) 5 MG tablet Take 1 tablet (5 mg total) by mouth daily. 30 tablet 5  . Multiple Vitamin (MULTIVITAMIN) tablet Take 1 tablet by mouth daily.    Marland Kitchen testosterone (ANDROGEL) 50 MG/5GM (1%) GEL APPLY 1 PACKET ONCE DAILY AS DIRECTED 150 g 0  . traZODone (DESYREL) 50 MG tablet Take 1 tablet (50 mg total) by mouth at bedtime. (Patient taking differently: Take 50 mg by mouth at bedtime. 25mg ) 90 tablet 3   No current facility-administered medications for this visit.     LABS/IMAGING: No results found for this or any previous visit (from the past 48 hour(s)). No results found.  WEIGHTS: Wt Readings from Last 3 Encounters:  10/04/15 193 lb 9.6 oz (87.8 kg)  06/22/15 191 lb (86.6 kg)  05/30/15 187 lb 11.2 oz (85.1 kg)    VITALS: BP (!) 142/64 (BP Location: Left Arm, Patient Position: Sitting, Cuff Size: Normal)   Pulse (!) 44   Ht 5\' 11"  (1.803 m)   Wt 193 lb 9.6 oz (87.8 kg)   BMI 27.00 kg/m   EXAM: General appearance: alert, no distress and Mildly diaphoretic Neck: no carotid bruit and no JVD Lungs: clear to auscultation bilaterally Heart: Regular bradycardia Abdomen: soft, non-tender; bowel sounds normal; no masses,  no organomegaly Extremities: extremities normal, atraumatic, no cyanosis or edema Pulses: 2+ and  symmetric Skin: Pale, moist Neurologic: Grossly normal Psych: Pleasant  EKG: Sinus rhythm with complete heart block, right bundle branch block, inferior T-wave inversion  ASSESSMENT: 1. New complete heart block-mildly symptomatic with dyspnea 2. Palpitations - PVCs including trigeminy and quadrigeminy, generally improved on b-blocker 3. Abnormal EKG with bifascicular block and competing junctional bradycardia 4. History of chest wall radiation for Hodgkin's lymphoma 5. Essential hypertension  PLAN: 1.   Mr. Kestel has had marked improvement in his PVCs on low-dose beta blocker (Toprol-XL 12.5 mg every afternoon). Unfortunately, his conduction delays worsened and now has complete heart block. He says over the past week with significant questioning that he may feel mildly short of breath with exertion. He also might of had some mild chest pressure. Otherwise he is asymptomatic. I'm very concerned about him today because of his new heart block and the fact that he was very diaphoretic in the office. He says he  thinks she's coming down with a cold however I think he may feel that just because of his low heart rate. He did have recent stress testing which was negative for ischemia and the EKG does not show any acute ischemic changes compared to his prior EKG. He last took his beta blocker last evening and I advised him to discontinue it completely. Given his complete heart block, diaphoresis and other symptoms I do have some concern about his cardiac health and I advised him to be evaluated at the hospital today however he did not want to go to the emergency department. He felt like the only thing they may do is hold his beta blocker and watch him for some time. He understands and heart block is serious and potentially life-threatening and that even holding his beta blocker does not guarantee that his heart rate will improve or that he will no longer be in heart block. I will schedule him to come back for  follow-up tomorrow in the office for repeat EKG. If he remains in complete heart block, he may need to be referred to the hospital for further monitoring and evaluation for possible pacemaker. He was advised if he feels worse and has any worsening shortness of breath, chest pain, presyncope or syncopal events that he should present by ambulance to [place].  Pixie Casino, MD, [facility] Attending Cardiologist [place] 10/04/2015, 3:45 PM

## 2015-10-05 ENCOUNTER — Encounter: Payer: Self-pay | Admitting: Internal Medicine

## 2015-10-05 ENCOUNTER — Ambulatory Visit (INDEPENDENT_AMBULATORY_CARE_PROVIDER_SITE_OTHER): Payer: BLUE CROSS/BLUE SHIELD | Admitting: Internal Medicine

## 2015-10-05 VITALS — BP 148/82 | HR 88 | Ht 71.0 in | Wt 191.2 lb

## 2015-10-05 DIAGNOSIS — I442 Atrioventricular block, complete: Secondary | ICD-10-CM | POA: Diagnosis not present

## 2015-10-05 DIAGNOSIS — I493 Ventricular premature depolarization: Secondary | ICD-10-CM | POA: Diagnosis not present

## 2015-10-05 NOTE — Patient Instructions (Addendum)
You have been referred to Dr. [name]   Your physician recommends that you schedule a follow-up appointment in: [place] with Dr. Debara Pickett

## 2015-10-05 NOTE — Progress Notes (Signed)
OFFICE NOTE  Chief Complaint:  Follow-up complete heart block   Primary Care Physician: Nyoka Cowden, MD  HPI:  Tyler Hendrix is a pleasant 60 year old male who was previously seen by Dr. Acie Fredrickson in September of this past year. He is an add on to my schedule today for acute symptoms of palpitations. He reports over the past several days she's had skipped heartbeats and higher heart rates. He says his heart rate is typically in the 60s however recently has been in the 80s. He's also felt some skipped beats and they were happening 4-5 times a minute. He denies any chest pain or worsening shortness of breath. He's never had a stress test or any coronary disease evaluation. He does have a history of Hodgkin's lymphoma and had chest wall radiation. In September he had an echocardiogram which showed normal systolic function and it was felt that he was doing fairly well. He continued to do exercise which she has recently without any significant symptoms. 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   Tyler Hendrix returns today for follow-up. Overall he is feeling fairly well. He reports his palpitations have improved somewhat but he still feeling him. While wearing the monitor he was noted to have significant ventricular ectopy including isolated PVCs, and ventricular trigeminy and quadrigeminy. His nuclear stress test was negative for ischemia but was not gated. He did have an echo last fall which showed an EF of 55% which is reassuring.   At the pleasure see Tyler Hendrix back in the office today for follow-up. Overall he thinks he is doing much better on the high-dose of metoprolol 25 mg daily. Although he's tolerating this with very few palpitations, he still has some breakthrough in the afternoon. He generally takes the medicine in the morning. His EKG today however does show a low heart  rate of 48, with a short period to sinus rhythm and a competing junctional rhythm. I've had concerns about underlying conduction system disease based on his EKG showing a right bundle branch block and now there is some evidence of a junctional bradycardia. Despite this he has a good energy level, denies chest pain and is able to maintain normal physical activity. His wife recently had their new baby about 2 weeks ago.  06/22/2015  Tyler Hendrix returns today for follow-up. He reports that he is overall done fairly well. He denies any worsening shortness of breath or fatigue. Heart rate remains in the upper 40s although he has very little ventricular ectopy and his EKG today. He denies any significant palpitations. I think we found a balance currently between low-dose beta blocker and his underlying conduction disease. Should he have more symptoms however he may need antiarrhythmic medication. I do not see any features that are concerning for pacemaker at this point. Blood pressure, however remains elevated and not at goal.  10/04/2015  Tyler Hendrix was seen back today in follow-up. He reports a marked improvement in his PVCs however over the past week he's felt slightly more short of breath with exertion. He did not voluntarily mention this yet required significant persistent questioning on my part to get an answer out of him. He also said that he might have a little pressure in his chest when he tried to exercise. He denied any pain, dizziness, presyncope or syncopal symptoms. Blood pressure is improved somewhat  after the addition of amlodipine. He's no longer having any PVCs however his EKG is abnormal today showing a new complete heart block. Heart rate is 44 with a sinus rhythm and wide QRS. There are inferior T-wave abnormalities which were previously seen on his EKG.  10/05/2015  Tyler Hendrix was seen today in follow-up of complete heart block. This was noted yesterday during his office visit. Heart rate was  in the 40s and he was somewhat diaphoretic. He denied any chest pain or shortness of breath but has felt fatigue recently. He had taken Toprol XL the night before and I advised him to not take any last evening and allow the medicine to wash out of his system. A today he is a heart rate has improved up to 88 and is in sinus rhythm with first-degree AV block and bifascicular block. He thinks he actually feels a little better today although was not diaphoretic. He said he didn't sleep well last night and had some diarrhea this morning. He does not noted change in energy level although he feels that he's had some recurrent palpitations.  PMHx:  Past Medical History:  Diagnosis Date  . Hodgkin disease ([place])   . Right bundle branch block     Past Surgical History:  Procedure Laterality Date  . KNEE SURGERY Right 1972  . LUMBAR LAMINECTOMY  1996    FAMHx:  Family History  Problem Relation Age of Onset  . Cancer      prostate father hx  . Heart disease Mother   . Heart disease Father   . Colon cancer Neg Hx     SOCHx:   reports that he has never smoked. He has never used smokeless tobacco. He reports that he drinks about 3.0 oz of alcohol per week . He reports that he does not use drugs.  ALLERGIES:  No Known Allergies  ROS: Pertinent items noted in HPI and remainder of comprehensive ROS otherwise negative.  HOME MEDS: Current Outpatient Prescriptions  Medication Sig Dispense Refill  . amLODipine (NORVASC) 5 MG tablet Take 1 tablet (5 mg total) by mouth daily. 30 tablet 5  . Multiple Vitamin (MULTIVITAMIN) tablet Take 1 tablet by mouth daily.    Marland Kitchen testosterone (ANDROGEL) 50 MG/5GM (1%) GEL APPLY 1 PACKET ONCE DAILY AS DIRECTED 150 g 0  . traZODone (DESYREL) 50 MG tablet Take 50 mg by mouth as directed.     No current facility-administered medications for this visit.     LABS/IMAGING: No results found for this or any previous visit (from the past 48 hour(s)). No results  found.  WEIGHTS: Wt Readings from Last 3 Encounters:  10/05/15 191 lb 3.2 oz (86.7 kg)  10/04/15 193 lb 9.6 oz (87.8 kg)  06/22/15 191 lb (86.6 kg)    VITALS: BP (!) 148/82   Pulse 88   Ht 5\' 11"  (1.803 m)   Wt 191 lb 3.2 oz (86.7 kg)   BMI 26.67 kg/m   EXAM: Deferred  EKG: Deferred  ASSESSMENT: 1. New complete heart block-mildly symptomatic with dyspnea 2. Palpitations - PVCs including trigeminy and quadrigeminy, generally improved on b-blocker 3. Abnormal EKG with bifascicular block and competing junctional bradycardia 4. History of chest wall radiation for Hodgkin's lymphoma 5. Essential hypertension  PLAN: 1.   Tyler Hendrix returns today accompanied by his wife and youngest child. He has fortunately, out of heart block and is in a sinus rhythm today with a rate of 88. He seems to be feeling a  little better. He will not be able to take beta blocker anymore. Unfortunately, he had recurrent PVCs and trigeminy and quadrigeminy which he was symptomatic for and may likely have recurrence of. Given his heart block and underlying conduction delay as well as intolerance to beta blocker, I'm not certain as to the best way we can treat his palpitations. He may need evaluation for antiarrhythmic therapy or perhaps ablation of PVCs in order to keep him asymptomatic and avoid recurrent heart block. At this point he did not see clear indication for pacemaker. I like to refer him to Dr. Rayann Heman with cardiac EP to evaluate for treatment options for his PVCs and to see whether he may be a candidate for ablation.  Pixie Casino, MD, [facility] Attending Cardiologist [place] 10/05/2015, 5:09 PM

## 2015-10-11 ENCOUNTER — Encounter: Payer: Self-pay | Admitting: Internal Medicine

## 2015-10-12 DIAGNOSIS — Z950 Presence of cardiac pacemaker: Secondary | ICD-10-CM

## 2015-10-12 HISTORY — DX: Presence of cardiac pacemaker: Z95.0

## 2015-10-29 ENCOUNTER — Encounter: Payer: Self-pay | Admitting: Internal Medicine

## 2015-10-29 ENCOUNTER — Ambulatory Visit (INDEPENDENT_AMBULATORY_CARE_PROVIDER_SITE_OTHER): Payer: BLUE CROSS/BLUE SHIELD | Admitting: Internal Medicine

## 2015-10-29 VITALS — BP 160/82 | HR 54 | Ht 70.5 in | Wt 192.6 lb

## 2015-10-29 DIAGNOSIS — I493 Ventricular premature depolarization: Secondary | ICD-10-CM | POA: Diagnosis not present

## 2015-10-29 DIAGNOSIS — R0602 Shortness of breath: Secondary | ICD-10-CM

## 2015-10-29 DIAGNOSIS — I495 Sick sinus syndrome: Secondary | ICD-10-CM

## 2015-10-29 DIAGNOSIS — I442 Atrioventricular block, complete: Secondary | ICD-10-CM

## 2015-10-29 LAB — CBC WITH DIFFERENTIAL/PLATELET
Basophils Absolute: 0 cells/uL (ref 0–200)
Basophils Relative: 0 %
EOS PCT: 2 %
Eosinophils Absolute: 184 cells/uL (ref 15–500)
HCT: 44.3 % (ref 38.5–50.0)
HEMOGLOBIN: 15.6 g/dL (ref 13.2–17.1)
Lymphocytes Relative: 16 %
Lymphs Abs: 1472 cells/uL (ref 850–3900)
MCH: 31.5 pg (ref 27.0–33.0)
MCHC: 35.2 g/dL (ref 32.0–36.0)
MCV: 89.5 fL (ref 80.0–100.0)
MPV: 10.4 fL (ref 7.5–12.5)
Monocytes Absolute: 828 cells/uL (ref 200–950)
Monocytes Relative: 9 %
Neutro Abs: 6716 cells/uL (ref 1500–7800)
Neutrophils Relative %: 73 %
Platelets: 289 10*3/uL (ref 140–400)
RBC: 4.95 MIL/uL (ref 4.20–5.80)
RDW: 13.4 % (ref 11.0–15.0)
WBC: 9.2 10*3/uL (ref 3.8–10.8)

## 2015-10-29 LAB — TSH: TSH: 9.07 m[IU]/L — AB (ref 0.40–4.50)

## 2015-10-29 NOTE — Patient Instructions (Signed)
Medication Instructions:  Your physician recommends that you continue on your current medications as directed. Please refer to the Current Medication list given to you today.   Labwork: Your physician recommends that you return for lab work today: BMP/CBC/INR/TSH   Testing/Procedures: Your physician has requested that you have an echocardiogram. Echocardiography is a painless test that uses sound waves to create images of your heart. It provides your doctor with information about the size and shape of your heart and how well your heart's chambers and valves are working. This procedure takes approximately one hour. There are no restrictions for this procedure.--10/30/15  Your physician has requested that you have a cardiac catheterization. Cardiac catheterization is used to diagnose and/or treat various heart conditions. Doctors may recommend this procedure for a number of different reasons. The most common reason is to evaluate chest pain. Chest pain can be a symptom of coronary artery disease (CAD), and cardiac catheterization can show whether plaque is narrowing or blocking your heart's arteries. This procedure is also used to evaluate the valves, as well as measure the blood flow and oxygen levels in different parts of your heart. For further information please visit [email or web address]. Please follow instruction sheet, as given.--11/02/15   Your physician has recommended that you have a pacemaker inserted. A pacemaker is a small device that is placed under the skin of your chest or abdomen to help control abnormal heart rhythms. This device uses electrical pulses to prompt the heart to beat at a normal rate. Pacemakers are used to treat heart rhythms that are too slow. Wire (leads) are attached to the pacemaker that goes into the chambers of you heart. This is done in the hospital and usually requires and overnight stay. Please see the instruction sheet given to you today for more  information.--11/02/15  Please arrive at the [place] [facility] at 5:30am on 11/02/15 Do not eat or drink after midnight the night before your procedure Do not take any medications the morning of our procedure Plan for one night in the hospital You will need someone to drive you home after     Follow-Up: Your physician recommends that you schedule a follow-up appointment in: 10-14 days from 9/22/147 in device clinic for wound check and 3 months from 11/02/15 with Dr Rayann Heman   Any Other Special Instructions Will Be Listed Below (If Applicable).     If you need a refill on your cardiac medications before your next appointment, please call your pharmacy.

## 2015-10-30 ENCOUNTER — Ambulatory Visit (HOSPITAL_COMMUNITY): Payer: BLUE CROSS/BLUE SHIELD | Attending: Cardiology

## 2015-10-30 ENCOUNTER — Other Ambulatory Visit: Payer: Self-pay

## 2015-10-30 ENCOUNTER — Encounter: Payer: Self-pay | Admitting: Internal Medicine

## 2015-10-30 DIAGNOSIS — R0602 Shortness of breath: Secondary | ICD-10-CM | POA: Insufficient documentation

## 2015-10-30 DIAGNOSIS — I442 Atrioventricular block, complete: Secondary | ICD-10-CM | POA: Diagnosis not present

## 2015-10-30 LAB — BASIC METABOLIC PANEL
BUN: 16 mg/dL (ref 7–25)
CALCIUM: 9.3 mg/dL (ref 8.6–10.3)
CHLORIDE: 104 mmol/L (ref 98–110)
CO2: 22 mmol/L (ref 20–31)
Creat: 0.94 mg/dL (ref 0.70–1.33)
Glucose, Bld: 83 mg/dL (ref 65–99)
POTASSIUM: 4.1 mmol/L (ref 3.5–5.3)
Sodium: 137 mmol/L (ref 135–146)

## 2015-10-30 LAB — PROTIME-INR
INR: 1
Prothrombin Time: 10.5 s (ref 9.0–11.5)

## 2015-10-30 NOTE — Progress Notes (Signed)
Electrophysiology Office Note   Date:  10/30/2015   ID:  Tyler Hendrix, DOB 1955-02-20, MRN [id number]  PCP:  Rogelia Boga, MD  Cardiologist:  Dr Rennis Golden Primary Electrophysiologist: Hillis Range, MD    Chief Complaint  Patient presents with  . New Patient (Initial Visit)    PVCs     History of Present Illness: Tyler Hendrix is a 60 y.o. male who presents today for electrophysiology evaluation.   The patient has a h/o remote palpitations.  This was evaluated with holter monitor 06/2014 and he was found to have occasional PVCs as the likely cause though his overall PVC burden was very low (476 pvcs).  He was placed on metoprolol and did reasonably well.  Recently, he has noticed increasing SOB and fatigue.  He has also had some dizziness but without syncope.  He has not had significant palpitations of PVCs.  He was seen in follow-up by Dr Rennis Golden and noted to have complete heart block.  His metoprolol was discontinued and he returned the next day with some improvement in AV nodal conduction.  His symptoms however persisted.  He is referred for EP consultation and is noted to again have AV block today.  Today, he denies symptoms of chest pain, orthopnea, PND, lower extremity edema, claudication, presyncope, syncope, bleeding, or neurologic sequela. The patient is tolerating medications without difficulties and is otherwise without complaint today.    Past Medical History:  Diagnosis Date  . Complete heart block (HCC)   . Hodgkin disease (HCC)   . Premature ventricular contraction   . Right bundle branch block    Past Surgical History:  Procedure Laterality Date  . KNEE SURGERY Right 1972  . LUMBAR LAMINECTOMY  1996     Current Outpatient Prescriptions  Medication Sig Dispense Refill  . amLODipine (NORVASC) 5 MG tablet Take 1 tablet (5 mg total) by mouth daily. 30 tablet 5  . Multiple Vitamin (MULTIVITAMIN) tablet Take 1 tablet by mouth daily.    Marland Kitchen testosterone  (ANDROGEL) 50 MG/5GM (1%) GEL APPLY 1 PACKET ONCE DAILY AS DIRECTED (Patient taking differently: APPLY 1 PACKET EVERY 2 WEEKS) 150 g 0  . traZODone (DESYREL) 50 MG tablet Take 25-50 mg by mouth at bedtime as needed for sleep (Depends on if patient can sleep or not if takes 25-50 mg).     Marland Kitchen ibuprofen (ADVIL,MOTRIN) 200 MG tablet Take 200 mg by mouth every 6 (six) hours as needed for headache or mild pain.     No current facility-administered medications for this visit.     Allergies:   Review of patient's allergies indicates no known allergies.   Social History:  The patient  reports that he has never smoked. He has never used smokeless tobacco. He reports that he drinks about 3.0 oz of alcohol per week . He reports that he does not use drugs.   Family History:  The patient's  family history includes Heart disease in his father and mother.    ROS:  Please see the history of present illness.   All other systems are reviewed and negative.    PHYSICAL EXAM: VS:  BP (!) 160/82   Pulse (!) 54   Ht 5' 10.5" (1.791 m)   Wt 192 lb 9.6 oz (87.4 kg)   BMI 27.24 kg/m  , BMI Body mass index is 27.24 kg/m. GEN: Well nourished, well developed, in no acute distress  HEENT: normal  Neck: no JVD, carotid bruits, or masses Cardiac: bradycardic  regular rhythm; no murmurs, rubs, or gallops,no edema  Respiratory:  clear to auscultation bilaterally, normal work of breathing GI: soft, nontender, nondistended, + BS MS: no deformity or atrophy  Skin: warm and dry  Neuro:  Strength and sensation are intact Psych: euthymic mood, full affect  EKG:  EKG is ordered today. The ekg ordered today shows intermittent complete heart block with transient sinus bradycardia and junctional rhythm also noted on rhythm strip.  No pvcs   Recent Labs: 10/29/2015: BUN 16; Creat 0.94; Hemoglobin 15.6; Platelets 289; Potassium 4.1; Sodium 137; TSH 9.07    Lipid Panel     Component Value Date/Time   CHOL 146 04/20/2014  0848   TRIG 191.0 (H) 04/20/2014 0848   HDL 33.50 (L) 04/20/2014 0848   CHOLHDL 4 04/20/2014 0848   VLDL 38.2 04/20/2014 0848   LDLCALC 74 04/20/2014 0848   LDLDIRECT 88.6 10/17/2011 0846     Wt Readings from Last 3 Encounters:  10/29/15 192 lb 9.6 oz (87.4 kg)  10/05/15 191 lb 3.2 oz (86.7 kg)  10/04/15 193 lb 9.6 oz (87.8 kg)      Other studies Reviewed: Additional studies/ records that were reviewed today include: Dr Blanchie Dessert notes, prior echo, prior myoview, prior ekgs in epic  Review of the above records today demonstrates: I have reviewed all ekgs in epic and do not see pvcs on any of them.  Low pvc burden noted on holter   ASSESSMENT AND PLAN:  1.  Transient complete heart block with concomitant sinus node dysfunction and symptoms of SOB and dizziness. The patient presents with symptomatic AV block.  No reversible causes are found. I would therefore recommend pacemaker implantation at this time.  Risks, benefits, alternatives to pacemaker implantation were discussed in detail with the patient today. The patient understands that the risks include but are not limited to bleeding, infection, pneumothorax, perforation, tamponade, vascular damage, renal failure, MI, stroke, death,  and lead dislodgement and wishes to proceed. We will therefore schedule the procedure at the next available time.  I have instructed the patient to not drive in the interim.  He is to present immediately to the ED if he has syncope or presyncope. Echo and tsh ordered Cath as below to evaluate CAD as the cause  2. SOB Likely due to CHB and SSS though I cannot exclude CAD/ ischemia as the underlying cause. I would therefore recommend left heart catheterization with possible PCI.  Discussed the cath with the patient. The patient understands that risks included but are not limited to stroke (1 in 1000), death (1 in 1000), kidney failure [usually temporary] (1 in 500), bleeding (1 in 200), allergic reaction  [possibly serious] (1 in 200). The patient understands and agrees to proceed.  IF he does not have lesions amenable to revascularization that are expected to resolve AV block, then I will follow cath with PPM implant. Echo is ordered to evaluate SOB.  3. PVCs Though he may have rare palpitations with PVCs, I think that his PVC burden is relatively low and not amenable to ablation at this time. Once he has PPM in place, will restart beta blocker therapy.  Current medicines are reviewed at length with the patient today.   The patient does not have concerns regarding his medicines.  The following changes were made today:  none  Labs/ tests ordered today include:  Orders Placed This Encounter  Procedures  . Basic metabolic panel  . CBC with Differential  . TSH  .  Protime-INR  . EKG 12-Lead  . ECHOCARDIOGRAM COMPLETE     Signed, Hillis Range, MD  10/30/2015 10:44 PM     [facility] [address] [place] [address] [phone] (office) [phone] (fax)

## 2015-11-02 ENCOUNTER — Other Ambulatory Visit: Payer: Self-pay

## 2015-11-02 ENCOUNTER — Encounter (HOSPITAL_COMMUNITY)
Admission: RE | Disposition: A | Discharge status: Home / Self Care | Payer: Self-pay | Source: Ambulatory Visit | Attending: Cardiovascular Disease

## 2015-11-02 ENCOUNTER — Encounter (HOSPITAL_COMMUNITY): Payer: Self-pay | Admitting: Cardiovascular Disease

## 2015-11-02 ENCOUNTER — Ambulatory Visit (HOSPITAL_COMMUNITY)
Admission: RE | Admit: 2015-11-02 | Discharge: 2015-11-03 | Disposition: A | Discharge status: Home / Self Care | Payer: BLUE CROSS/BLUE SHIELD | Source: Ambulatory Visit | Attending: Cardiovascular Disease | Admitting: Cardiovascular Disease

## 2015-11-02 DIAGNOSIS — I493 Ventricular premature depolarization: Secondary | ICD-10-CM | POA: Insufficient documentation

## 2015-11-02 DIAGNOSIS — Z8571 Personal history of Hodgkin lymphoma: Secondary | ICD-10-CM | POA: Diagnosis not present

## 2015-11-02 DIAGNOSIS — I442 Atrioventricular block, complete: Secondary | ICD-10-CM | POA: Diagnosis present

## 2015-11-02 DIAGNOSIS — Z79899 Other long term (current) drug therapy: Secondary | ICD-10-CM | POA: Diagnosis not present

## 2015-11-02 DIAGNOSIS — I2510 Atherosclerotic heart disease of native coronary artery without angina pectoris: Secondary | ICD-10-CM | POA: Diagnosis not present

## 2015-11-02 DIAGNOSIS — I4510 Unspecified right bundle-branch block: Secondary | ICD-10-CM | POA: Diagnosis not present

## 2015-11-02 DIAGNOSIS — Z959 Presence of cardiac and vascular implant and graft, unspecified: Secondary | ICD-10-CM

## 2015-11-02 HISTORY — PX: CARDIAC CATHETERIZATION: SHX172

## 2015-11-02 HISTORY — PX: EP IMPLANTABLE DEVICE: SHX172B

## 2015-11-02 LAB — SURGICAL PCR SCREEN
MRSA, PCR: NEGATIVE
STAPHYLOCOCCUS AUREUS: NEGATIVE

## 2015-11-02 SURGERY — PACEMAKER IMPLANT

## 2015-11-02 SURGERY — LEFT HEART CATH AND CORONARY ANGIOGRAPHY

## 2015-11-02 MED ORDER — SODIUM CHLORIDE 0.9 % IV SOLN: INTRAVENOUS | Status: DC

## 2015-11-02 MED ORDER — SODIUM CHLORIDE 0.9% FLUSH: 3.0000 mL | INTRAVENOUS | Status: DC | PRN

## 2015-11-02 MED ORDER — LIDOCAINE HCL (PF) 1 % IJ SOLN
INTRAMUSCULAR | Status: DC | PRN
  Administered 2015-11-02: 40 mL

## 2015-11-02 MED ORDER — IOPAMIDOL (ISOVUE-370) INJECTION 76%
INTRAVENOUS | Status: AC
Start: 2015-11-02 — End: 2015-11-02
  Filled 2015-11-02: qty 50

## 2015-11-02 MED ORDER — HYDROCODONE-ACETAMINOPHEN 5-325 MG PO TABS: 1.0000 | ORAL_TABLET | ORAL | Status: DC | PRN

## 2015-11-02 MED ORDER — HYDRALAZINE HCL 20 MG/ML IJ SOLN
10.0000 mg | Freq: Once | INTRAMUSCULAR | Status: AC
  Administered 2015-11-02: 20 mg via INTRAVENOUS

## 2015-11-02 MED ORDER — ASPIRIN 81 MG PO CHEW
CHEWABLE_TABLET | ORAL | Status: AC
  Filled 2015-11-02: qty 1

## 2015-11-02 MED ORDER — IOPAMIDOL (ISOVUE-370) INJECTION 76%
INTRAVENOUS | Status: DC | PRN
  Administered 2015-11-02: 95 mL via INTRAVENOUS

## 2015-11-02 MED ORDER — FENTANYL CITRATE (PF) 100 MCG/2ML IJ SOLN
INTRAMUSCULAR | Status: AC
  Filled 2015-11-02: qty 2

## 2015-11-02 MED ORDER — VERAPAMIL HCL 2.5 MG/ML IV SOLN
INTRAVENOUS | Status: AC
  Filled 2015-11-02: qty 2

## 2015-11-02 MED ORDER — CEFAZOLIN SODIUM-DEXTROSE 2-3 GM-% IV SOLR
INTRAVENOUS | Status: DC | PRN
  Administered 2015-11-02: 2 g via INTRAVENOUS

## 2015-11-02 MED ORDER — MUPIROCIN 2 % EX OINT: 1.0000 "application " | TOPICAL_OINTMENT | Freq: Once | CUTANEOUS | Status: DC

## 2015-11-02 MED ORDER — MIDAZOLAM HCL 2 MG/2ML IJ SOLN
INTRAMUSCULAR | Status: DC | PRN
  Administered 2015-11-02: 2 mg via INTRAVENOUS

## 2015-11-02 MED ORDER — SODIUM CHLORIDE 0.9% FLUSH
3.0000 mL | Freq: Two times a day (BID) | INTRAVENOUS | Status: DC
  Administered 2015-11-02: 3 mL via INTRAVENOUS

## 2015-11-02 MED ORDER — SODIUM CHLORIDE 0.9 % IR SOLN
Status: AC
  Filled 2015-11-02: qty 2

## 2015-11-02 MED ORDER — SODIUM CHLORIDE 0.9% FLUSH: 3.0000 mL | Freq: Two times a day (BID) | INTRAVENOUS | Status: DC

## 2015-11-02 MED ORDER — SODIUM CHLORIDE 0.9 % IV SOLN: 250.0000 mL | INTRAVENOUS | Status: DC | PRN

## 2015-11-02 MED ORDER — IOPAMIDOL (ISOVUE-370) INJECTION 76%
INTRAVENOUS | Status: DC | PRN
  Administered 2015-11-02: 15 mL via INTRAVENOUS

## 2015-11-02 MED ORDER — ACETAMINOPHEN 325 MG PO TABS
650.0000 mg | ORAL_TABLET | ORAL | Status: DC | PRN
  Administered 2015-11-02: 650 mg via ORAL

## 2015-11-02 MED ORDER — VERAPAMIL HCL 2.5 MG/ML IV SOLN
INTRAVENOUS | Status: DC | PRN
  Administered 2015-11-02: 10 mL via INTRA_ARTERIAL

## 2015-11-02 MED ORDER — HEPARIN (PORCINE) IN NACL 2-0.9 UNIT/ML-% IJ SOLN
INTRAMUSCULAR | Status: DC | PRN
  Administered 2015-11-02: 2000 mL

## 2015-11-02 MED ORDER — ASPIRIN 81 MG PO CHEW
81.0000 mg | CHEWABLE_TABLET | ORAL | Status: AC
Start: 2015-11-02 — End: 2015-11-02
  Administered 2015-11-02: 81 mg via ORAL

## 2015-11-02 MED ORDER — ONDANSETRON HCL 4 MG/2ML IJ SOLN: 4.0000 mg | Freq: Four times a day (QID) | INTRAMUSCULAR | Status: DC | PRN

## 2015-11-02 MED ORDER — LIDOCAINE HCL (PF) 1 % IJ SOLN
INTRAMUSCULAR | Status: AC
  Filled 2015-11-02: qty 30

## 2015-11-02 MED ORDER — SODIUM CHLORIDE 0.9 % WEIGHT BASED INFUSION: 1.0000 mL/kg/h | INTRAVENOUS | Status: DC

## 2015-11-02 MED ORDER — HEPARIN (PORCINE) IN NACL 2-0.9 UNIT/ML-% IJ SOLN
INTRAMUSCULAR | Status: AC
  Filled 2015-11-02: qty 500

## 2015-11-02 MED ORDER — MUPIROCIN 2 % EX OINT
TOPICAL_OINTMENT | CUTANEOUS | Status: AC
  Filled 2015-11-02: qty 22

## 2015-11-02 MED ORDER — CEFAZOLIN IN D5W 1 GM/50ML IV SOLN
1.0000 g | Freq: Four times a day (QID) | INTRAVENOUS | Status: AC
  Administered 2015-11-02 – 2015-11-03 (×3): 1 g via INTRAVENOUS
  Filled 2015-11-02 (×3): qty 50

## 2015-11-02 MED ORDER — SODIUM CHLORIDE 0.9 % IR SOLN: 80.0000 mg | Status: DC

## 2015-11-02 MED ORDER — CEFAZOLIN SODIUM-DEXTROSE 2-4 GM/100ML-% IV SOLN: 2.0000 g | INTRAVENOUS | Status: DC

## 2015-11-02 MED ORDER — MIDAZOLAM HCL 2 MG/2ML IJ SOLN
INTRAMUSCULAR | Status: AC
  Filled 2015-11-02: qty 2

## 2015-11-02 MED ORDER — LIDOCAINE HCL (PF) 1 % IJ SOLN
INTRAMUSCULAR | Status: DC | PRN
  Administered 2015-11-02: 2 mL via INTRADERMAL

## 2015-11-02 MED ORDER — IOPAMIDOL (ISOVUE-370) INJECTION 76%
INTRAVENOUS | Status: AC
  Filled 2015-11-02: qty 50

## 2015-11-02 MED ORDER — IOPAMIDOL (ISOVUE-370) INJECTION 76%
INTRAVENOUS | Status: AC
  Filled 2015-11-02: qty 100

## 2015-11-02 MED ORDER — HEPARIN SODIUM (PORCINE) 1000 UNIT/ML IJ SOLN
INTRAMUSCULAR | Status: DC | PRN
  Administered 2015-11-02: 4500 [IU] via INTRAVENOUS

## 2015-11-02 MED ORDER — FENTANYL CITRATE (PF) 100 MCG/2ML IJ SOLN
INTRAMUSCULAR | Status: DC | PRN
  Administered 2015-11-02: 25 ug via INTRAVENOUS

## 2015-11-02 MED ORDER — CEFAZOLIN SODIUM-DEXTROSE 2-4 GM/100ML-% IV SOLN
INTRAVENOUS | Status: AC
  Filled 2015-11-02: qty 100

## 2015-11-02 MED ORDER — TRAZODONE HCL 50 MG PO TABS
25.0000 mg | ORAL_TABLET | Freq: Every evening | ORAL | Status: DC | PRN
  Administered 2015-11-02: 50 mg via ORAL
  Filled 2015-11-02: qty 1

## 2015-11-02 MED ORDER — ACETAMINOPHEN 325 MG PO TABS
325.0000 mg | ORAL_TABLET | ORAL | Status: DC | PRN
  Filled 2015-11-02: qty 2

## 2015-11-02 MED ORDER — HEPARIN (PORCINE) IN NACL 2-0.9 UNIT/ML-% IJ SOLN
INTRAMUSCULAR | Status: AC
  Filled 2015-11-02: qty 2000

## 2015-11-02 MED ORDER — CHLORHEXIDINE GLUCONATE 4 % EX LIQD: 60.0000 mL | Freq: Once | CUTANEOUS | Status: DC

## 2015-11-02 MED ORDER — AMLODIPINE BESYLATE 5 MG PO TABS
5.0000 mg | ORAL_TABLET | Freq: Every day | ORAL | Status: DC
  Administered 2015-11-02 – 2015-11-03 (×2): 5 mg via ORAL
  Filled 2015-11-02 (×2): qty 1

## 2015-11-02 MED ORDER — SODIUM CHLORIDE 0.9 % WEIGHT BASED INFUSION
3.0000 mL/kg/h | INTRAVENOUS | Status: DC
  Administered 2015-11-02: 3 mL/kg/h via INTRAVENOUS

## 2015-11-02 MED ORDER — MIDAZOLAM HCL 5 MG/5ML IJ SOLN
INTRAMUSCULAR | Status: AC
  Filled 2015-11-02: qty 5

## 2015-11-02 MED ORDER — HEPARIN SODIUM (PORCINE) 1000 UNIT/ML IJ SOLN
INTRAMUSCULAR | Status: AC
  Filled 2015-11-02: qty 1

## 2015-11-02 MED ORDER — LIDOCAINE HCL (PF) 1 % IJ SOLN
INTRAMUSCULAR | Status: AC
  Filled 2015-11-02: qty 60

## 2015-11-02 SURGICAL SUPPLY — 11 items
CABLE SURGICAL S-101-97-12 (CABLE) ×4 IMPLANT
CATH RIGHTSITE C315HIS02 (CATHETERS) ×2 IMPLANT
LEAD CAPSURE NOVUS 5076-52CM (Lead) ×2 IMPLANT
LEAD CAPSURE NOVUS 5076-58CM (Lead) ×2 IMPLANT
LEAD SELECT SECURE 3830 383069 (Lead) ×1 IMPLANT
PAD DEFIB LIFELINK (PAD) ×2 IMPLANT
PPM ADVISA MRI DR A2DR01 (Pacemaker) ×2 IMPLANT
SELECT SECURE 3830 383069 (Lead) ×2 IMPLANT
SHEATH CLASSIC 7F (SHEATH) ×4 IMPLANT
TRAY PACEMAKER INSERTION (PACKS) ×2 IMPLANT
WIRE HI TORQ VERSACORE-J 145CM (WIRE) ×2 IMPLANT

## 2015-11-02 SURGICAL SUPPLY — 13 items
CATH EXPO 5FR FR4 (CATHETERS) ×2 IMPLANT
CATH INFINITI 5 FR AL2 (CATHETERS) ×2 IMPLANT
CATH INFINITI 5 FR AR1 MOD (CATHETERS) ×2 IMPLANT
CATH INFINITI 5 FR AR2 MOD (CATHETERS) ×2 IMPLANT
CATH INFINITI 5 FR JL3.5 (CATHETERS) ×2 IMPLANT
DEVICE RAD COMP TR BAND LRG (VASCULAR PRODUCTS) ×2 IMPLANT
GLIDESHEATH SLEND SS 6F .021 (SHEATH) ×2 IMPLANT
KIT HEART LEFT (KITS) ×2 IMPLANT
PACK CARDIAC CATHETERIZATION (CUSTOM PROCEDURE TRAY) ×2 IMPLANT
SYR MEDRAD MARK V 150ML (SYRINGE) ×2 IMPLANT
TRANSDUCER W/STOPCOCK (MISCELLANEOUS) ×2 IMPLANT
TUBING CIL FLEX 10 FLL-RA (TUBING) ×2 IMPLANT
WIRE SAFE-T 1.5MM-J .035X260CM (WIRE) ×2 IMPLANT

## 2015-11-02 NOTE — Progress Notes (Signed)
Dr Rayann Heman in to see pt and is aware of BP.  No new orders written.

## 2015-11-02 NOTE — Progress Notes (Addendum)
Pt to procedure for pacemaker. Dr Rayann Heman in to talk to pt

## 2015-11-02 NOTE — Discharge Summary (Signed)
ELECTROPHYSIOLOGY PROCEDURE DISCHARGE SUMMARY    Patient ID: Tyler Hendrix,  MRN: [id number], DOB/AGE: 59-Nov-1957 59 y.o.  Admit date: 11/02/2015 Discharge date: 11/03/2015  Primary Care Physician: Tyler Boga, MD Primary Cardiologist: Tyler Hendrix Electrophysiologist: Tyler Hendrix  Primary Discharge Diagnosis:  1.  Symptomatic complete heart block status post pacemaker implantation this admission  2.  CAD s/p LHC this admission  Secondary Discharge Diagnosis:  1.  PVC  No Known Allergies   Procedures This Admission:  1.  Cardiac catheterization on 11/02/15 by Dr Tyler Hendrix. This study demonstrated severe stenosis of diagonal branches of LAD; severe stenosis of mid circ; moderate diffuse mid RCA and mid LAD stenosis; normal LV function. There were no early apparent complications.  2.  Implantation of a MDT dual chamber PPM on 11/02/15 by Dr Tyler Hendrix.  The patient received a MDT model number Advisa PPM with model number 5076 right atrial lead and 5076 right ventricular lead. There were no immediate post procedure complications. 3.  CXR on 11/03/15 demonstrated no pneumothorax status post device implantation.   Brief HPI: Tyler Hendrix is a 60 y.o. male was referred to electrophysiology in the outpatient setting for evaluation of PVC's and shortness of breath. He has also had documented complete heart block.  Plan was made for cardiac catheterization as well as pacemaker placement to evaluate symptoms. The patient has had symptomatic bradycardia without reversible causes identified.  Risks, benefits, and alternatives to cath and PPM implantation were reviewed with the patient who wished to proceed.   Hospital Course:  The patient was admitted and underwent cardiac catheterization with details as outlined above. He also underwent implantation of a MDT dual chamber pacemaker with details as outlined above.  He  was monitored on telemetry overnight which demonstrated sinus rhythm with V  pacing.  Left chest was without hematoma or ecchymosis. Right wrist was without complication. The device was interrogated and found to be functioning normally.  CXR was obtained and demonstrated no pneumothorax status post device implantation.  Wound care, arm mobility, and restrictions were reviewed with the patient.  The patient was examined and considered stable for discharge to home.   He will be seen in the office in 10 days for follow up at which time he will be scheduled for elective PCI with Dr Tyler Hendrix.  He will be started on ASA and statin at discharge. Beta blocker to be added back by patient if blood pressure remains elevated.  Regardless, would add toprol back at 10 day follow-up for CAD.   Physical Exam: Vitals:   11/02/15 1740 11/02/15 1810 11/02/15 2156 11/03/15 0500  BP: (!) 149/76 (!) 153/80 126/77 132/77  Pulse:   81 (!) 50  Resp:   18 16  Temp:   98.3 F (36.8 C) 97.7 F (36.5 C)  TempSrc:      SpO2:   95% 95%  Weight:    183 lb 12.8 oz (83.4 kg)  Height:        GEN- The patient is well appearing, alert and oriented x 3 today.   HEENT: normocephalic, atraumatic; sclera clear, conjunctiva pink; hearing intact; oropharynx clear; neck supple Lungs- Clear to ausculation bilaterally, normal work of breathing.  No wheezes, rales, rhonchi Heart- Regular rate and rhythm, no murmurs, rubs or gallops  GI- soft, non-tender, non-distended, bowel sounds present Extremities- no clubbing, cyanosis, or edema  MS- no significant deformity or atrophy Skin- warm and dry, no rash or lesion, left chest without hematoma/ecchymosis Psych- euthymic mood,  full affect Neuro- strength and sensation are intact   Labs:   Lab Results  Component Value Date   WBC 9.2 10/29/2015   HGB 15.6 10/29/2015   HCT 44.3 10/29/2015   MCV 89.5 10/29/2015   PLT 289 10/29/2015     Recent Labs Lab 11/03/15 0338  NA 138  K 3.8  CL 108  CO2 21*  BUN 14  CREATININE 0.93  CALCIUM 9.1  GLUCOSE  101*    Discharge Medications:    Medication List    TAKE these medications   amLODipine 5 MG tablet Commonly known as:  NORVASC Take 1 tablet (5 mg total) by mouth daily.   aspirin EC 81 MG tablet Take 1 tablet (81 mg total) by mouth daily.   atorvastatin 80 MG tablet Commonly known as:  LIPITOR Take 1 tablet (80 mg total) by mouth daily.   ibuprofen 200 MG tablet Commonly known as:  ADVIL,MOTRIN Take 200 mg by mouth every 6 (six) hours as needed for headache or mild pain.   multivitamin tablet Take 1 tablet by mouth daily.   testosterone 50 MG/5GM (1%) Gel Commonly known as:  ANDROGEL APPLY 1 PACKET ONCE DAILY AS DIRECTED What changed:  See the new instructions.   traZODone 50 MG tablet Commonly known as:  DESYREL Take 25-50 mg by mouth at bedtime as needed for sleep (Depends on if patient can sleep or not if takes 25-50 mg).       Disposition:   Follow-up Information    [facility] Office Follow up on 11/14/2015.   Specialty:  Cardiology Why:  at [facility] for wound check  Contact information: [address], [address] [place] [address] [phone]          Duration of Discharge Encounter: Greater than 30 minutes including physician time.  Tyler Idol MD 11/03/2015 8:04 AM

## 2015-11-02 NOTE — Discharge Instructions (Signed)
° ° °  Supplemental Discharge Instructions for  Pacemaker/Defibrillator Patients  Activity No heavy lifting or vigorous activity with your left/right arm for 6 to 8 weeks.  Do not raise your left/right arm above your head for one week.  Gradually raise your affected arm as drawn below.           __        11/06/15                        11/07/15                      11/08/15                 11/09/15  NO DRIVING for  1 week   ; you may begin driving on   [id number]  .  WOUND CARE - Keep the wound area clean and dry.  Do not get this area wet for one week. No showers for one week; you may shower on 11/09/15    . - The tape/steri-strips on your wound will fall off; do not pull them off.  No bandage is needed on the site.  DO  NOT apply any creams, oils, or ointments to the wound area. - If you notice any drainage or discharge from the wound, any swelling or bruising at the site, or you develop a fever > 101? F after you are discharged home, call the office at once.  Special Instructions - You are still able to use cellular telephones; use the ear opposite the side where you have your pacemaker/defibrillator.  Avoid carrying your cellular phone near your device. - When traveling through airports, show security personnel your identification card to avoid being screened in the metal detectors.  Ask the security personnel to use the hand wand. - Avoid arc welding equipment, TENS units (transcutaneous nerve stimulators).  Call the office for questions about other devices. - Avoid electrical appliances that are in poor condition or are not properly grounded. - Microwave ovens are safe to be near or to operate.

## 2015-11-02 NOTE — Interval H&P Note (Signed)
History and Physical Interval Note:  11/02/2015 7:58 AM  Tyler Hendrix  has presented today for surgery, with the diagnosis of hb  The various methods of treatment have been discussed with the patient and family. After consideration of risks, benefits and other options for treatment, the patient has consented to  Procedure(s): Pacemaker Implant (N/A) as a surgical intervention .  The patient's history has been reviewed, patient examined, no change in status, stable for surgery.  I have reviewed the patient's chart and labs.  Questions were answered to the patient's satisfaction.    Pt with complete heart block of unknown etiology.  Likely due to prior radiation therapy.  Plan is for cath this am to exclude ischemia as the cause.  If no lesions amenable to revascularization to correct the situation, will then proceed with PPM.  Risks of cath and PPM were again discussed at length with patient and his friend today.  He wishes to proceed.  [name]

## 2015-11-02 NOTE — Interval H&P Note (Signed)
History and Physical Interval Note:  11/02/2015 8:36 AM  Tyler Hendrix  has presented today for surgery, with the diagnosis of hb  The various methods of treatment have been discussed with the patient and family. After consideration of risks, benefits and other options for treatment, the patient has consented to  Procedure(s): Left Heart Cath and Coronary Angiography (N/A) as a surgical intervention .  The patient's history has been reviewed, patient examined, no change in status, stable for surgery.  I have reviewed the patient's chart and labs.  Questions were answered to the patient's satisfaction.     Sherren Mocha

## 2015-11-02 NOTE — H&P (View-Only) (Signed)
Electrophysiology Office Note   Date:  10/30/2015   ID:  Tyler Hendrix, DOB 1955-02-20, MRN [id number]  PCP:  Rogelia Boga, MD  Cardiologist:  Dr Rennis Golden Primary Electrophysiologist: Hillis Range, MD    Chief Complaint  Patient presents with  . New Patient (Initial Visit)    PVCs     History of Present Illness: Tyler Hendrix is a 60 y.o. male who presents today for electrophysiology evaluation.   The patient has a h/o remote palpitations.  This was evaluated with holter monitor 06/2014 and he was found to have occasional PVCs as the likely cause though his overall PVC burden was very low (476 pvcs).  He was placed on metoprolol and did reasonably well.  Recently, he has noticed increasing SOB and fatigue.  He has also had some dizziness but without syncope.  He has not had significant palpitations of PVCs.  He was seen in follow-up by Dr Rennis Golden and noted to have complete heart block.  His metoprolol was discontinued and he returned the next day with some improvement in AV nodal conduction.  His symptoms however persisted.  He is referred for EP consultation and is noted to again have AV block today.  Today, he denies symptoms of chest pain, orthopnea, PND, lower extremity edema, claudication, presyncope, syncope, bleeding, or neurologic sequela. The patient is tolerating medications without difficulties and is otherwise without complaint today.    Past Medical History:  Diagnosis Date  . Complete heart block (HCC)   . Hodgkin disease (HCC)   . Premature ventricular contraction   . Right bundle branch block    Past Surgical History:  Procedure Laterality Date  . KNEE SURGERY Right 1972  . LUMBAR LAMINECTOMY  1996     Current Outpatient Prescriptions  Medication Sig Dispense Refill  . amLODipine (NORVASC) 5 MG tablet Take 1 tablet (5 mg total) by mouth daily. 30 tablet 5  . Multiple Vitamin (MULTIVITAMIN) tablet Take 1 tablet by mouth daily.    Marland Kitchen testosterone  (ANDROGEL) 50 MG/5GM (1%) GEL APPLY 1 PACKET ONCE DAILY AS DIRECTED (Patient taking differently: APPLY 1 PACKET EVERY 2 WEEKS) 150 g 0  . traZODone (DESYREL) 50 MG tablet Take 25-50 mg by mouth at bedtime as needed for sleep (Depends on if patient can sleep or not if takes 25-50 mg).     Marland Kitchen ibuprofen (ADVIL,MOTRIN) 200 MG tablet Take 200 mg by mouth every 6 (six) hours as needed for headache or mild pain.     No current facility-administered medications for this visit.     Allergies:   Review of patient's allergies indicates no known allergies.   Social History:  The patient  reports that he has never smoked. He has never used smokeless tobacco. He reports that he drinks about 3.0 oz of alcohol per week . He reports that he does not use drugs.   Family History:  The patient's  family history includes Heart disease in his father and mother.    ROS:  Please see the history of present illness.   All other systems are reviewed and negative.    PHYSICAL EXAM: VS:  BP (!) 160/82   Pulse (!) 54   Ht 5' 10.5" (1.791 m)   Wt 192 lb 9.6 oz (87.4 kg)   BMI 27.24 kg/m  , BMI Body mass index is 27.24 kg/m. GEN: Well nourished, well developed, in no acute distress  HEENT: normal  Neck: no JVD, carotid bruits, or masses Cardiac: bradycardic  regular rhythm; no murmurs, rubs, or gallops,no edema  Respiratory:  clear to auscultation bilaterally, normal work of breathing GI: soft, nontender, nondistended, + BS MS: no deformity or atrophy  Skin: warm and dry  Neuro:  Strength and sensation are intact Psych: euthymic mood, full affect  EKG:  EKG is ordered today. The ekg ordered today shows intermittent complete heart block with transient sinus bradycardia and junctional rhythm also noted on rhythm strip.  No pvcs   Recent Labs: 10/29/2015: BUN 16; Creat 0.94; Hemoglobin 15.6; Platelets 289; Potassium 4.1; Sodium 137; TSH 9.07    Lipid Panel     Component Value Date/Time   CHOL 146 04/20/2014  0848   TRIG 191.0 (H) 04/20/2014 0848   HDL 33.50 (L) 04/20/2014 0848   CHOLHDL 4 04/20/2014 0848   VLDL 38.2 04/20/2014 0848   LDLCALC 74 04/20/2014 0848   LDLDIRECT 88.6 10/17/2011 0846     Wt Readings from Last 3 Encounters:  10/29/15 192 lb 9.6 oz (87.4 kg)  10/05/15 191 lb 3.2 oz (86.7 kg)  10/04/15 193 lb 9.6 oz (87.8 kg)      Other studies Reviewed: Additional studies/ records that were reviewed today include: Dr Blanchie Dessert notes, prior echo, prior myoview, prior ekgs in epic  Review of the above records today demonstrates: I have reviewed all ekgs in epic and do not see pvcs on any of them.  Low pvc burden noted on holter   ASSESSMENT AND PLAN:  1.  Transient complete heart block with concomitant sinus node dysfunction and symptoms of SOB and dizziness. The patient presents with symptomatic AV block.  No reversible causes are found. I would therefore recommend pacemaker implantation at this time.  Risks, benefits, alternatives to pacemaker implantation were discussed in detail with the patient today. The patient understands that the risks include but are not limited to bleeding, infection, pneumothorax, perforation, tamponade, vascular damage, renal failure, MI, stroke, death,  and lead dislodgement and wishes to proceed. We will therefore schedule the procedure at the next available time.  I have instructed the patient to not drive in the interim.  He is to present immediately to the ED if he has syncope or presyncope. Echo and tsh ordered Cath as below to evaluate CAD as the cause  2. SOB Likely due to CHB and SSS though I cannot exclude CAD/ ischemia as the underlying cause. I would therefore recommend left heart catheterization with possible PCI.  Discussed the cath with the patient. The patient understands that risks included but are not limited to stroke (1 in 1000), death (1 in 1000), kidney failure [usually temporary] (1 in 500), bleeding (1 in 200), allergic reaction  [possibly serious] (1 in 200). The patient understands and agrees to proceed.  IF he does not have lesions amenable to revascularization that are expected to resolve AV block, then I will follow cath with PPM implant. Echo is ordered to evaluate SOB.  3. PVCs Though he may have rare palpitations with PVCs, I think that his PVC burden is relatively low and not amenable to ablation at this time. Once he has PPM in place, will restart beta blocker therapy.  Current medicines are reviewed at length with the patient today.   The patient does not have concerns regarding his medicines.  The following changes were made today:  none  Labs/ tests ordered today include:  Orders Placed This Encounter  Procedures  . Basic metabolic panel  . CBC with Differential  . TSH  .  Protime-INR  . EKG 12-Lead  . ECHOCARDIOGRAM COMPLETE     Signed, Hillis Range, MD  10/30/2015 10:44 PM     [facility] [address] [place] [address] [phone] (office) [phone] (fax)

## 2015-11-03 ENCOUNTER — Ambulatory Visit (HOSPITAL_COMMUNITY): Payer: BLUE CROSS/BLUE SHIELD

## 2015-11-03 ENCOUNTER — Encounter (HOSPITAL_COMMUNITY): Payer: Self-pay

## 2015-11-03 DIAGNOSIS — I442 Atrioventricular block, complete: Secondary | ICD-10-CM | POA: Diagnosis not present

## 2015-11-03 DIAGNOSIS — I2510 Atherosclerotic heart disease of native coronary artery without angina pectoris: Secondary | ICD-10-CM | POA: Diagnosis not present

## 2015-11-03 DIAGNOSIS — I493 Ventricular premature depolarization: Secondary | ICD-10-CM | POA: Diagnosis not present

## 2015-11-03 DIAGNOSIS — I4510 Unspecified right bundle-branch block: Secondary | ICD-10-CM | POA: Diagnosis not present

## 2015-11-03 LAB — BASIC METABOLIC PANEL
Anion gap: 9 (ref 5–15)
BUN: 14 mg/dL (ref 6–20)
CALCIUM: 9.1 mg/dL (ref 8.9–10.3)
CO2: 21 mmol/L — AB (ref 22–32)
CREATININE: 0.93 mg/dL (ref 0.61–1.24)
Chloride: 108 mmol/L (ref 101–111)
GFR calc non Af Amer: >60 mL/min (ref >60)
Glucose, Bld: 101 mg/dL — ABNORMAL HIGH (ref 65–99)
Potassium: 3.8 mmol/L (ref 3.5–5.1)
SODIUM: 138 mmol/L (ref 135–145)

## 2015-11-03 MED ORDER — ASPIRIN EC 81 MG PO TBEC: 81.0000 mg | DELAYED_RELEASE_TABLET | Freq: Every day | ORAL | 0 refills | Status: DC

## 2015-11-03 MED ORDER — ATORVASTATIN CALCIUM 80 MG PO TABS: 80.0000 mg | ORAL_TABLET | Freq: Every day | ORAL | 4 refills | Status: DC

## 2015-11-05 ENCOUNTER — Encounter (HOSPITAL_COMMUNITY): Payer: Self-pay | Admitting: Internal Medicine

## 2015-11-05 MED FILL — Sodium Chloride Irrigation Soln 0.9%: Qty: 1000 | Status: AC

## 2015-11-05 MED FILL — Fentanyl Citrate Preservative Free (PF) Inj 100 MCG/2ML: INTRAMUSCULAR | Qty: 2 | Status: AC

## 2015-11-05 MED FILL — Gentamicin Sulfate Inj 40 MG/ML: INTRAMUSCULAR | Qty: 2 | Status: AC

## 2015-11-05 MED FILL — Midazolam HCl Inj 5 MG/5ML (Base Equivalent): INTRAMUSCULAR | Qty: 5 | Status: AC

## 2015-11-14 ENCOUNTER — Encounter: Payer: Self-pay | Admitting: Internal Medicine

## 2015-11-14 ENCOUNTER — Ambulatory Visit (INDEPENDENT_AMBULATORY_CARE_PROVIDER_SITE_OTHER): Payer: BLUE CROSS/BLUE SHIELD | Admitting: Nurse Practitioner

## 2015-11-14 DIAGNOSIS — I442 Atrioventricular block, complete: Secondary | ICD-10-CM

## 2015-11-14 DIAGNOSIS — I493 Ventricular premature depolarization: Secondary | ICD-10-CM

## 2015-11-14 DIAGNOSIS — I2510 Atherosclerotic heart disease of native coronary artery without angina pectoris: Secondary | ICD-10-CM

## 2015-11-14 LAB — CUP PACEART INCLINIC DEVICE CHECK
Implantable Lead Implant Date: 20170922
Implantable Lead Location: 753860
Implantable Lead Model: 5076
MDC IDC LEAD IMPLANT DT: 20170922
MDC IDC LEAD LOCATION: 753859
MDC IDC SESS DTM: 20171004161009

## 2015-11-14 MED ORDER — CLOPIDOGREL BISULFATE 75 MG PO TABS: 75.0000 mg | ORAL_TABLET | Freq: Every day | ORAL | 3 refills | Status: DC

## 2015-11-14 NOTE — Progress Notes (Signed)
Electrophysiology Office Note Date: 11/14/2015  ID:  Tyler Hendrix, DOB 11/06/1955, MRN [id number]  PCP: Rogelia Boga, MD Primary Cardiologist: Hilty Electrophysiologist: Allred  CC: Pacemaker and CAD follow up  Tyler Hendrix is a 60 y.o. male seen today for Dr Johney Frame.  He presents today for post hospital electrophysiology followup.  Since discharge, the patient reports doing very well.  He has increased energy and decreased shortness of breath since pacemaker implant.  He denies chest pain, palpitations, dyspnea, PND, orthopnea, nausea, vomiting, dizziness, syncope, edema, weight gain, or early satiety.  Device History: MDT dual chamber PPM implanted 2017 for complete heart block    Past Medical History:  Diagnosis Date  . Complete heart block (HCC)   . Hodgkin disease (HCC)   . Premature ventricular contraction   . Right bundle branch block    Past Surgical History:  Procedure Laterality Date  . CARDIAC CATHETERIZATION N/A 11/02/2015   Procedure: Left Heart Cath and Coronary Angiography;  Surgeon: Tonny Bollman, MD;  Location: [facility] INVASIVE CV LAB;  Service: Cardiovascular;  Laterality: N/A;  . EP IMPLANTABLE DEVICE N/A 11/02/2015   Procedure: Pacemaker Implant;  Surgeon: Hillis Range, MD;  Location: [facility] INVASIVE CV LAB;  Service: Cardiovascular;  Laterality: N/A;  . KNEE SURGERY Right 1972  . LUMBAR LAMINECTOMY  1996    Current Outpatient Prescriptions  Medication Sig Dispense Refill  . amLODipine (NORVASC) 5 MG tablet Take 1 tablet (5 mg total) by mouth daily. 30 tablet 5  . aspirin EC 81 MG tablet Take 1 tablet (81 mg total) by mouth daily. 30 tablet 0  . atorvastatin (LIPITOR) 80 MG tablet Take 1 tablet (80 mg total) by mouth daily. 30 tablet 4  . ibuprofen (ADVIL,MOTRIN) 200 MG tablet Take 200 mg by mouth every 6 (six) hours as needed for headache or mild pain.    . Multiple Vitamin (MULTIVITAMIN) tablet Take 1 tablet by mouth daily.    Marland Kitchen testosterone  (ANDROGEL) 50 MG/5GM (1%) GEL APPLY 1 PACKET ONCE DAILY AS DIRECTED (Patient taking differently: APPLY 1 PACKET EVERY 2 WEEKS) 150 g 0  . traZODone (DESYREL) 50 MG tablet Take 25 mg by mouth at bedtime as needed for sleep.      No current facility-administered medications for this visit.     Allergies:   Review of patient's allergies indicates no known allergies.   Social History: Social History   Social History  . Marital status: Married    Spouse name: N/A  . Number of children: 2  . Years of education: Masters   Occupational History  . lawyer     [employer]   Social History Main Topics  . Smoking status: Never Smoker  . Smokeless tobacco: Never Used  . Alcohol use 3.0 oz/week    5 Glasses of wine per week  . Drug use: No  . Sexual activity: Not on file   Other Topics Concern  . 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 Cancer      prostate father hx  . Heart disease Mother   . Heart disease Father   . Colon cancer Neg Hx      Review of Systems: All other systems reviewed and are otherwise negative except as noted above.   Physical Exam: GEN- The patient is well appearing, alert and  oriented x 3 today.   HEENT: normocephalic, atraumatic; sclera clear, conjunctiva pink; hearing intact; oropharynx clear; neck supple  Lungs-normal work of breathing Heart- Regular rate and rhythm (paced) GI- soft, non-tender, non-distended, bowel sounds present  Extremities- no clubbing, cyanosis, or edema  MS- no significant deformity or atrophy Skin- warm and dry, no rash or lesion; PPM pocket well healed Psych- euthymic mood, full affect Neuro- strength and sensation are intact  PPM Interrogation- reviewed in detail today,  See PACEART report  EKG:  EKG is not ordered today.  Recent Labs: 10/29/2015: Hemoglobin 15.6; Platelets 289; TSH  9.07 11/03/2015: BUN 14; Creatinine, Ser 0.93; Potassium 3.8; Sodium 138   Wt Readings from Last 3 Encounters:  11/03/15 183 lb 12.8 oz (83.4 kg)  10/29/15 192 lb 9.6 oz (87.4 kg)  10/05/15 191 lb 3.2 oz (86.7 kg)     Other studies Reviewed: Additional studies/ records that were reviewed today include: hospital records   Assessment and Plan:  1.  Complete heart block Normal PPM function See Pace Art report No changes today Wound well healed  2.  CAD Cath 11/02/15 demonstrated severe stenosis of the diagonal branches of the LAD, severe stenosis of the midcircumflex, moderate diffuse mid RCA and mid to distal LAD stenosis Plan PCI with Dr Excell Seltzer 11/21/15 Will start Plavix 75mg  daily today as per Dr Earmon Phoenix recommendation PPM pocket well healed Continue ASA/statin  3.  PVCs Burden <0.1/hour by device interrogation today   Current medicines are reviewed at length with the patient today.   The patient does not have concerns regarding his medicines.  The following changes were made today:  Start Plavix 75mg  daily  Labs/ tests ordered today include:  Orders Placed This Encounter  Procedures  . Implantable device check     Disposition:   Follow up with Dr Johney Frame as scheduled    Signed, Gypsy Balsam, NP 11/14/2015 4:12 PM  [facility] [address] [place] [address] [phone] (office) [phone] (fax)

## 2015-11-21 ENCOUNTER — Encounter (HOSPITAL_COMMUNITY): Payer: Self-pay | Admitting: *Deleted

## 2015-11-21 ENCOUNTER — Ambulatory Visit (HOSPITAL_COMMUNITY)
Admission: RE | Admit: 2015-11-21 | Discharge: 2015-11-21 | Disposition: A | Discharge status: Home / Self Care | Payer: BLUE CROSS/BLUE SHIELD | Source: Ambulatory Visit | Attending: Cardiovascular Disease | Admitting: Cardiovascular Disease

## 2015-11-21 ENCOUNTER — Encounter (HOSPITAL_COMMUNITY)
Admission: RE | Disposition: A | Discharge status: Home / Self Care | Payer: Self-pay | Source: Ambulatory Visit | Attending: Cardiovascular Disease

## 2015-11-21 DIAGNOSIS — I2510 Atherosclerotic heart disease of native coronary artery without angina pectoris: Secondary | ICD-10-CM

## 2015-11-21 DIAGNOSIS — Z7982 Long term (current) use of aspirin: Secondary | ICD-10-CM | POA: Insufficient documentation

## 2015-11-21 DIAGNOSIS — Z8249 Family history of ischemic heart disease and other diseases of the circulatory system: Secondary | ICD-10-CM | POA: Diagnosis not present

## 2015-11-21 DIAGNOSIS — Z923 Personal history of irradiation: Secondary | ICD-10-CM | POA: Diagnosis not present

## 2015-11-21 DIAGNOSIS — Z955 Presence of coronary angioplasty implant and graft: Secondary | ICD-10-CM

## 2015-11-21 DIAGNOSIS — R0609 Other forms of dyspnea: Secondary | ICD-10-CM | POA: Diagnosis present

## 2015-11-21 DIAGNOSIS — R0600 Dyspnea, unspecified: Secondary | ICD-10-CM | POA: Diagnosis present

## 2015-11-21 DIAGNOSIS — I4510 Unspecified right bundle-branch block: Secondary | ICD-10-CM | POA: Insufficient documentation

## 2015-11-21 DIAGNOSIS — Z8571 Personal history of Hodgkin lymphoma: Secondary | ICD-10-CM

## 2015-11-21 DIAGNOSIS — I25118 Atherosclerotic heart disease of native coronary artery with other forms of angina pectoris: Secondary | ICD-10-CM | POA: Insufficient documentation

## 2015-11-21 DIAGNOSIS — Z8042 Family history of malignant neoplasm of prostate: Secondary | ICD-10-CM | POA: Diagnosis not present

## 2015-11-21 DIAGNOSIS — I442 Atrioventricular block, complete: Secondary | ICD-10-CM | POA: Diagnosis not present

## 2015-11-21 DIAGNOSIS — I10 Essential (primary) hypertension: Secondary | ICD-10-CM | POA: Insufficient documentation

## 2015-11-21 DIAGNOSIS — I493 Ventricular premature depolarization: Secondary | ICD-10-CM | POA: Diagnosis present

## 2015-11-21 DIAGNOSIS — Z950 Presence of cardiac pacemaker: Secondary | ICD-10-CM | POA: Insufficient documentation

## 2015-11-21 HISTORY — DX: Atherosclerotic heart disease of native coronary artery without angina pectoris: I25.10

## 2015-11-21 HISTORY — DX: Other specified abnormal findings of blood chemistry: R79.89

## 2015-11-21 HISTORY — PX: CARDIAC CATHETERIZATION: SHX172

## 2015-11-21 HISTORY — DX: Ventricular premature depolarization: I49.3

## 2015-11-21 HISTORY — DX: Presence of cardiac pacemaker: Z95.0

## 2015-11-21 LAB — CBC
HEMATOCRIT: 40.6 % (ref 39.0–52.0)
HEMOGLOBIN: 14.2 g/dL (ref 13.0–17.0)
MCH: 31.2 pg (ref 26.0–34.0)
MCHC: 35 g/dL (ref 30.0–36.0)
MCV: 89.2 fL (ref 78.0–100.0)
Platelets: 274 10*3/uL (ref 150–400)
RBC: 4.55 MIL/uL (ref 4.22–5.81)
RDW: 12.8 % (ref 11.5–15.5)
WBC: 8.7 10*3/uL (ref 4.0–10.5)

## 2015-11-21 LAB — BASIC METABOLIC PANEL
ANION GAP: 8 (ref 5–15)
BUN: 12 mg/dL (ref 6–20)
CHLORIDE: 107 mmol/L (ref 101–111)
CO2: 25 mmol/L (ref 22–32)
Calcium: 9.1 mg/dL (ref 8.9–10.3)
Creatinine, Ser: 0.95 mg/dL (ref 0.61–1.24)
GFR calc Af Amer: >60 mL/min (ref >60)
GFR calc non Af Amer: >60 mL/min (ref >60)
GLUCOSE: 104 mg/dL — AB (ref 65–99)
POTASSIUM: 4.2 mmol/L (ref 3.5–5.1)
Sodium: 140 mmol/L (ref 135–145)

## 2015-11-21 LAB — POCT ACTIVATED CLOTTING TIME: ACTIVATED CLOTTING TIME: 510 s

## 2015-11-21 SURGERY — CORONARY STENT INTERVENTION
Anesthesia: LOCAL

## 2015-11-21 MED ORDER — NITROGLYCERIN 0.4 MG SL SUBL: 0.4000 mg | SUBLINGUAL_TABLET | SUBLINGUAL | 3 refills | Status: DC | PRN

## 2015-11-21 MED ORDER — SODIUM CHLORIDE 0.9 % WEIGHT BASED INFUSION
3.0000 mL/kg/h | INTRAVENOUS | Status: DC
  Administered 2015-11-21: 3 mL/kg/h via INTRAVENOUS

## 2015-11-21 MED ORDER — NITROGLYCERIN 1 MG/10 ML FOR IR/CATH LAB
INTRA_ARTERIAL | Status: AC
  Filled 2015-11-21: qty 10

## 2015-11-21 MED ORDER — SODIUM CHLORIDE 0.9 % WEIGHT BASED INFUSION: 1.0000 mL/kg/h | INTRAVENOUS | Status: DC

## 2015-11-21 MED ORDER — FENTANYL CITRATE (PF) 100 MCG/2ML IJ SOLN
INTRAMUSCULAR | Status: AC
  Filled 2015-11-21: qty 2

## 2015-11-21 MED ORDER — ACETAMINOPHEN 325 MG PO TABS: 650.0000 mg | ORAL_TABLET | ORAL | Status: DC | PRN

## 2015-11-21 MED ORDER — NITROGLYCERIN 1 MG/10 ML FOR IR/CATH LAB
INTRA_ARTERIAL | Status: DC | PRN
  Administered 2015-11-21 (×3): 150 ug via INTRACORONARY

## 2015-11-21 MED ORDER — HEPARIN (PORCINE) IN NACL 2-0.9 UNIT/ML-% IJ SOLN
INTRAMUSCULAR | Status: DC | PRN
  Administered 2015-11-21: 10 mL via INTRA_ARTERIAL

## 2015-11-21 MED ORDER — LIDOCAINE HCL (PF) 1 % IJ SOLN
INTRAMUSCULAR | Status: AC
  Filled 2015-11-21: qty 30

## 2015-11-21 MED ORDER — VERAPAMIL HCL 2.5 MG/ML IV SOLN
INTRAVENOUS | Status: AC
  Filled 2015-11-21: qty 2

## 2015-11-21 MED ORDER — LIDOCAINE HCL (PF) 1 % IJ SOLN
INTRAMUSCULAR | Status: DC | PRN
  Administered 2015-11-21: 3 mL

## 2015-11-21 MED ORDER — CLOPIDOGREL BISULFATE 75 MG PO TABS: 75.0000 mg | ORAL_TABLET | Freq: Every day | ORAL | 0 refills | Status: DC

## 2015-11-21 MED ORDER — SODIUM CHLORIDE 0.9 % IV SOLN: 250.0000 mL | INTRAVENOUS | Status: DC | PRN

## 2015-11-21 MED ORDER — CLOPIDOGREL BISULFATE 75 MG PO TABS
ORAL_TABLET | ORAL | Status: AC
  Administered 2015-11-21: 75 mg
  Filled 2015-11-21: qty 1

## 2015-11-21 MED ORDER — ASPIRIN 81 MG PO CHEW
81.0000 mg | CHEWABLE_TABLET | ORAL | Status: AC
  Administered 2015-11-21: 81 mg via ORAL

## 2015-11-21 MED ORDER — CLOPIDOGREL BISULFATE 75 MG PO TABS: 75.0000 mg | ORAL_TABLET | Freq: Every day | ORAL | Status: DC

## 2015-11-21 MED ORDER — SODIUM CHLORIDE 0.9 % WEIGHT BASED INFUSION: 3.0000 mL/kg/h | INTRAVENOUS | Status: AC

## 2015-11-21 MED ORDER — FENTANYL CITRATE (PF) 100 MCG/2ML IJ SOLN
INTRAMUSCULAR | Status: DC | PRN
  Administered 2015-11-21: 25 ug via INTRAVENOUS
  Administered 2015-11-21: 50 ug via INTRAVENOUS

## 2015-11-21 MED ORDER — ATORVASTATIN CALCIUM 80 MG PO TABS: 80.0000 mg | ORAL_TABLET | Freq: Every day | ORAL | Status: DC

## 2015-11-21 MED ORDER — IOPAMIDOL (ISOVUE-370) INJECTION 76%
INTRAVENOUS | Status: AC
  Filled 2015-11-21: qty 125

## 2015-11-21 MED ORDER — ASPIRIN 81 MG PO CHEW
CHEWABLE_TABLET | ORAL | Status: AC
  Administered 2015-11-21: 81 mg via ORAL
  Filled 2015-11-21: qty 1

## 2015-11-21 MED ORDER — IOPAMIDOL (ISOVUE-370) INJECTION 76%
INTRAVENOUS | Status: DC | PRN
  Administered 2015-11-21: 165 mL via INTRA_ARTERIAL

## 2015-11-21 MED ORDER — SODIUM CHLORIDE 0.9 % IV SOLN
INTRAVENOUS | Status: DC | PRN
  Administered 2015-11-21: 1.75 mg/kg/h via INTRAVENOUS

## 2015-11-21 MED ORDER — TRAZODONE 25 MG HALF TABLET: 25.0000 mg | ORAL_TABLET | Freq: Every evening | ORAL | Status: DC | PRN

## 2015-11-21 MED ORDER — ASPIRIN EC 81 MG PO TBEC: 81.0000 mg | DELAYED_RELEASE_TABLET | Freq: Every day | ORAL | Status: DC

## 2015-11-21 MED ORDER — MIDAZOLAM HCL 2 MG/2ML IJ SOLN
INTRAMUSCULAR | Status: AC
  Filled 2015-11-21: qty 2

## 2015-11-21 MED ORDER — IOPAMIDOL (ISOVUE-370) INJECTION 76%
INTRAVENOUS | Status: AC
  Filled 2015-11-21: qty 50

## 2015-11-21 MED ORDER — AMLODIPINE BESYLATE 5 MG PO TABS
5.0000 mg | ORAL_TABLET | ORAL | Status: AC
  Administered 2015-11-21: 5 mg via ORAL
  Filled 2015-11-21: qty 1

## 2015-11-21 MED ORDER — ASPIRIN EC 81 MG PO TBEC: 81.0000 mg | DELAYED_RELEASE_TABLET | Freq: Every day | ORAL | 0 refills | Status: DC

## 2015-11-21 MED ORDER — LIDOCAINE IN D5W 4-5 MG/ML-% IV SOLN
INTRAVENOUS | Status: AC
  Filled 2015-11-21: qty 500

## 2015-11-21 MED ORDER — HEPARIN (PORCINE) IN NACL 2-0.9 UNIT/ML-% IJ SOLN
INTRAMUSCULAR | Status: AC
  Filled 2015-11-21: qty 1000

## 2015-11-21 MED ORDER — SODIUM CHLORIDE 0.9% FLUSH: 3.0000 mL | INTRAVENOUS | Status: DC | PRN

## 2015-11-21 MED ORDER — BIVALIRUDIN BOLUS VIA INFUSION - CUPID
INTRAVENOUS | Status: DC | PRN
  Administered 2015-11-21: 65.325 mg via INTRAVENOUS

## 2015-11-21 MED ORDER — HEPARIN (PORCINE) IN NACL 2-0.9 UNIT/ML-% IJ SOLN
INTRAMUSCULAR | Status: DC | PRN
  Administered 2015-11-21: 1000 mL

## 2015-11-21 MED ORDER — BIVALIRUDIN 250 MG IV SOLR
INTRAVENOUS | Status: AC
  Filled 2015-11-21: qty 250

## 2015-11-21 MED ORDER — ANGIOPLASTY BOOK
Freq: Once | Status: DC
  Filled 2015-11-21: qty 1

## 2015-11-21 MED ORDER — SODIUM CHLORIDE 0.9% FLUSH: 3.0000 mL | Freq: Two times a day (BID) | INTRAVENOUS | Status: DC

## 2015-11-21 MED ORDER — MIDAZOLAM HCL 2 MG/2ML IJ SOLN
INTRAMUSCULAR | Status: DC | PRN
  Administered 2015-11-21 (×2): 2 mg via INTRAVENOUS

## 2015-11-21 MED ORDER — CLOPIDOGREL BISULFATE 75 MG PO TABS: 75.0000 mg | ORAL_TABLET | Freq: Once | ORAL | Status: DC

## 2015-11-21 MED ORDER — ONE-DAILY MULTI VITAMINS PO TABS: 1.0000 | ORAL_TABLET | Freq: Every day | ORAL | Status: DC

## 2015-11-21 MED ORDER — ONDANSETRON HCL 4 MG/2ML IJ SOLN: 4.0000 mg | Freq: Four times a day (QID) | INTRAMUSCULAR | Status: DC | PRN

## 2015-11-21 MED FILL — ASPIR-LOW EC 81 MG TABLET: 81 | 30 days supply | Qty: 30 | Fill #0

## 2015-11-21 MED FILL — CLOPIDOGREL 75 MG TABLET: 75 | 30 days supply | Qty: 30 | Fill #0

## 2015-11-21 SURGICAL SUPPLY — 16 items
BALLN MINITREK RX 2.0X12 (BALLOONS) ×2
BALLN ~~LOC~~ EMERGE MR 2.5X15 (BALLOONS) ×2
BALLOON MINITREK RX 2.0X12 (BALLOONS) ×1 IMPLANT
BALLOON ~~LOC~~ EMERGE MR 2.5X15 (BALLOONS) ×1 IMPLANT
CATH VISTA GUIDE 6FR XBLAD3.5 (CATHETERS) ×2 IMPLANT
DEVICE RAD COMP TR BAND LRG (VASCULAR PRODUCTS) ×2 IMPLANT
GLIDESHEATH SLEND SS 6F .021 (SHEATH) ×2 IMPLANT
KIT ENCORE 26 ADVANTAGE (KITS) ×4 IMPLANT
KIT HEART LEFT (KITS) ×2 IMPLANT
PACK CARDIAC CATHETERIZATION (CUSTOM PROCEDURE TRAY) ×2 IMPLANT
STENT SYNERGY DES 2.25X24 (Permanent Stent) ×2 IMPLANT
STENT SYNERGY DES 3X12 (Permanent Stent) ×2 IMPLANT
TRANSDUCER W/STOPCOCK (MISCELLANEOUS) ×2 IMPLANT
TUBING CIL FLEX 10 FLL-RA (TUBING) ×2 IMPLANT
WIRE HI TORQ BMW 190CM (WIRE) ×2 IMPLANT
WIRE HI TORQ VERSACORE-J 145CM (WIRE) ×2 IMPLANT

## 2015-11-21 NOTE — Progress Notes (Signed)
Pt up and ambulating without complaint. Denies chest pain. Rt wrist level 0.

## 2015-11-21 NOTE — H&P (View-Only) (Signed)
Electrophysiology Office Note Date: 11/14/2015  ID:  Tyler Hendrix, DOB 11/06/1955, MRN [id number]  PCP: Tyler Boga, MD Primary Cardiologist: Tyler Hendrix: Tyler Hendrix  CC: Pacemaker and CAD follow up  Tyler Hendrix is a 60 y.o. male seen today for Tyler Hendrix.  He presents today for post hospital electrophysiology followup.  Since discharge, the patient reports doing very well.  He has increased energy and decreased shortness of breath since pacemaker implant.  He denies chest pain, palpitations, dyspnea, PND, orthopnea, nausea, vomiting, dizziness, syncope, edema, weight gain, or early satiety.  Device History: MDT dual chamber PPM implanted 2017 for complete heart block    Past Medical History:  Diagnosis Date  . Complete heart block (HCC)   . Hodgkin disease (HCC)   . Premature ventricular contraction   . Right bundle branch block    Past Surgical History:  Procedure Laterality Date  . CARDIAC CATHETERIZATION N/A 11/02/2015   Procedure: Left Heart Cath and Coronary Angiography;  Surgeon: Tyler Bollman, MD;  Location: [facility] INVASIVE CV LAB;  Service: Cardiovascular;  Laterality: N/A;  . EP IMPLANTABLE DEVICE N/A 11/02/2015   Procedure: Pacemaker Implant;  Surgeon: Tyler Range, MD;  Location: [facility] INVASIVE CV LAB;  Service: Cardiovascular;  Laterality: N/A;  . KNEE SURGERY Right 1972  . LUMBAR LAMINECTOMY  1996    Current Outpatient Prescriptions  Medication Sig Dispense Refill  . amLODipine (NORVASC) 5 MG tablet Take 1 tablet (5 mg total) by mouth daily. 30 tablet 5  . aspirin EC 81 MG tablet Take 1 tablet (81 mg total) by mouth daily. 30 tablet 0  . atorvastatin (LIPITOR) 80 MG tablet Take 1 tablet (80 mg total) by mouth daily. 30 tablet 4  . ibuprofen (ADVIL,MOTRIN) 200 MG tablet Take 200 mg by mouth every 6 (six) hours as needed for headache or mild pain.    . Multiple Vitamin (MULTIVITAMIN) tablet Take 1 tablet by mouth daily.    Marland Kitchen testosterone  (ANDROGEL) 50 MG/5GM (1%) GEL APPLY 1 PACKET ONCE DAILY AS DIRECTED (Patient taking differently: APPLY 1 PACKET EVERY 2 WEEKS) 150 g 0  . traZODone (DESYREL) 50 MG tablet Take 25 mg by mouth at bedtime as needed for sleep.      No current facility-administered medications for this visit.     Allergies:   Review of patient's allergies indicates no known allergies.   Social History: Social History   Social History  . Marital status: Married    Spouse name: N/A  . Number of children: 2  . Years of education: Masters   Occupational History  . lawyer     [employer]   Social History Main Topics  . Smoking status: Never Smoker  . Smokeless tobacco: Never Used  . Alcohol use 3.0 oz/week    5 Glasses of wine per week  . Drug use: No  . Sexual activity: Not on file   Other Topics Concern  . 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 Cancer      prostate father hx  . Heart disease Mother   . Heart disease Father   . Colon cancer Neg Hx      Review of Systems: All other systems reviewed and are otherwise negative except as noted above.   Physical Exam: GEN- The patient is well appearing, alert and  oriented x 3 today.   HEENT: normocephalic, atraumatic; sclera clear, conjunctiva pink; hearing intact; oropharynx clear; neck supple  Lungs-normal work of breathing Heart- Regular rate and rhythm (paced) GI- soft, non-tender, non-distended, bowel sounds present  Extremities- no clubbing, cyanosis, or edema  MS- no significant deformity or atrophy Skin- warm and dry, no rash or lesion; PPM pocket well healed Psych- euthymic mood, full affect Neuro- strength and sensation are intact  PPM Interrogation- reviewed in detail today,  See PACEART report  EKG:  EKG is not ordered today.  Recent Labs: 10/29/2015: Hemoglobin 15.6; Platelets 289; TSH  9.07 11/03/2015: BUN 14; Creatinine, Ser 0.93; Potassium 3.8; Sodium 138   Wt Readings from Last 3 Encounters:  11/03/15 183 lb 12.8 oz (83.4 kg)  10/29/15 192 lb 9.6 oz (87.4 kg)  10/05/15 191 lb 3.2 oz (86.7 kg)     Other studies Reviewed: Additional studies/ records that were reviewed today include: hospital records   Assessment and Plan:  1.  Complete heart block Normal PPM function See Pace Art report No changes today Wound well healed  2.  CAD Cath 11/02/15 demonstrated severe stenosis of the diagonal branches of the LAD, severe stenosis of the midcircumflex, moderate diffuse mid RCA and mid to distal LAD stenosis Plan PCI with Tyler Excell Seltzer 11/21/15 Will start Plavix 75mg  daily today as per Tyler Earmon Phoenix recommendation PPM pocket well healed Continue ASA/statin  3.  PVCs Burden <0.1/hour by device interrogation today   Current medicines are reviewed at length with the patient today.   The patient does not have concerns regarding his medicines.  The following changes were made today:  Start Plavix 75mg  daily  Labs/ tests ordered today include:  Orders Placed This Encounter  Procedures  . Implantable device check     Disposition:   Follow up with Tyler Hendrix as scheduled    Signed, Tyler Balsam, NP 11/14/2015 4:12 PM  [facility] [address] [place] [address] [phone] (office) [phone] (fax)

## 2015-11-21 NOTE — Discharge Summary (Signed)
Discharge Summary    Patient ID: Tyler Hendrix,  MRN: [id number], DOB/AGE: [birth year] 60 y.o.  Admit date: 11/21/2015 Discharge date: 11/21/2015  Primary Care Provider: Rogelia Boga Primary Cardiologist: Dr. Rennis Golden / EP: Dr. Johney Frame  Discharge Diagnoses    Principal Problem:   CAD in native artery Active Problems:   Essential hypertension   Complete heart block (HCC)   PVCs (premature ventricular contractions)   Exertional dyspnea  Diagnostic Studies/Procedures    1. Cardiac catheterization this admission, please see full report and below for summary. 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   The patient has a h/o remote palpitations. This was evaluated with Holter monitor 06/2014 and he was found to have occasional PVCs as the likely cause though his overall PVC burden was very low (476 pvcs). He was placed on metoprolol and did reasonably well, albeit dose limited by junctional bradycardia noted on prior EKG. More recently, he's been noticing increasing SOB, fatigue, and some dizziness but no palpitations or syncope. He was seen in follow-up by Dr. Rennis Golden 10/04/15 at which time he was noted to have new complete heart block with HR 44. His metoprolol was discontinued and he returned the next day with some improvement in AV nodal conduction.  His symptoms however persisted. He was referred to Dr. Johney Frame with persistent AV block and ultimately underwent MDT dual-chamber pacemaker 11/02/15. During that admission he also underwent LHC which showed severe stenosis of the diagonal branches of the LAD,  severe stenosis of the mid circumflex, moderate diffuse mid RCA and mid to distal LAD stenosis, normal LV function. The plan was to return for planned PCI. He was started on  Plavix 11/14/15 at his pre-cath office visit. Pre-cath labs showed normal CBC, BMET, and abnormal TSH. (This was not felt to explain heart block - it was recommended he f/u with PCP to further evaluate.) The patient was brought back to the cath lab today (11/21/15) and underwent successful 2 vessel PCI of the first diagonal branch of the LAD in the mid left circumflex with DES. It was recommended he remain on dual antiplatelet therapy with aspirin and clopidogrel for 12 months. We piloted the same day PCI protocol with this patient to send in rx to [facility] for bedside delivery of 30-day aspirin and Plavix (he already has regular rx at home that was prescribed at [facility]). His afternoon BP was slightly elevated but he did not take amlodipine this AM - this trended back down after administration. He was advised to monitor his blood pressure and call if running >135 systolic. He was observed for 6 hours post-PCI without any post-cath complications. Dr. Excell Seltzer has seen and examined the patient today and feels he is stable for discharge. He will remain on current home regimen, except was recommended to avoid use of NSAIDs if possible and to discuss risk of testosterone repletion with prescribing provider as this can increase cardiovascular risks. Consider updating lipid panel/CMET upon follow-up if not recently performed by PCP. F/u scheduled with Azalee Course PA-C in 7 days.  _____________  Discharge Vitals Blood pressure (!) 146/72, pulse (!) 52, temperature 97.4 F (36.3 C), temperature source Oral, resp. rate 19, height 5\' 11"  (1.803 m), weight 192 lb (87.1 kg), SpO2 97 %.  Right radial cath site without hematoma or ecchymosis; good pulse, no bruit. Filed Weights   11/21/15 0839  Weight: 192 lb (87.1 kg)    Labs & Radiologic Studies    CBC  Recent Labs  11/21/15 0842  WBC 8.7  HGB 14.2  HCT 40.6  MCV 89.2  PLT 274   Basic Metabolic Panel  Recent Labs  11/21/15 0842  NA 140    K 4.2  CL 107  CO2 25  GLUCOSE 104*  BUN 12  CREATININE 0.95  CALCIUM 9.1  _____________  Dg Chest 2 View  Result Date: 11/03/2015 CLINICAL DATA:  Cardiac device placed yesterday. EXAM: CHEST  2 VIEW COMPARISON:  None. FINDINGS: Left anterior chest wall sequential pacemaker has its leads positioned within the right atrium right ventricle. No pneumothorax. Lungs are clear.  No pleural effusion. Cardiac silhouette is mildly enlarged. No mediastinal or hilar masses or evidence of adenopathy. Bony thorax is intact. IMPRESSION: 1. Left anterior chest wall pacemaker is well positioned. No pneumothorax. 2. No acute cardiopulmonary disease. Electronically Signed   By: Amie Portland M.D.   On: 11/03/2015 09:13   Disposition   Pt is being discharged home today in good condition.  Follow-up Plans & Appointments    Follow-up Information    [place], [place] .   Specialties:  Cardiology, Radiology Why:  Please follow up with Azalee Course PA-C, one of Dr. Blanchie Dessert PAs, in 1 week - appointment listed below on 11/28/15. Contact information: [address] [place] [address] [phone]          Discharge Instructions    Amb Referral to Cardiac Rehabilitation    Complete by:  As directed    Diagnosis:  Coronary Stents   Diet - low sodium heart healthy    Complete by:  As directed    Increase activity slowly    Complete by:  As directed    Please talk to the provider that prescribed your testosterone replacement - testosterone can increase your future cardiovascular risks, so this may need to be reconsidered.  Patients taking blood thinners should generally stay away from medicines like ibuprofen, Advil, Motrin, naproxen, and Aleve due to risk of stomach bleeding. You may take Tylenol as directed or talk to your primary doctor about alternatives.  No driving for 2 days. No lifting over 5 lbs for 1 week. No sexual activity for 1 week. Keep procedure site clean & dry. If you notice  increased pain, swelling, bleeding or pus, call/return!  You may shower, but no soaking baths/hot tubs/pools for 1 week.      Discharge Medications     Medication List    STOP taking these medications   ibuprofen 200 MG tablet Commonly known as:  ADVIL,MOTRIN     TAKE these medications   amLODipine 5 MG tablet Commonly known as:  NORVASC Take 1 tablet (5 mg total) by mouth daily.   aspirin EC 81 MG tablet Take 1 tablet (81 mg total) by mouth daily.   atorvastatin 80 MG tablet Commonly known as:  LIPITOR Take 1 tablet (80 mg total) by mouth daily.   clopidogrel 75 MG tablet Commonly known as:  PLAVIX Take 1 tablet (75 mg total) by mouth daily.   multivitamin tablet Take 1 tablet by mouth daily.   nitroGLYCERIN 0.4 MG SL tablet Commonly known as:  NITROSTAT Place 1 tablet (0.4 mg total) under the tongue every 5 (five) minutes as needed for chest pain (up  to 3 doses).   testosterone 50 MG/5GM (1%) Gel Commonly known as:  ANDROGEL APPLY 1 PACKET ONCE DAILY AS DIRECTED What changed:  See the new instructions.   traZODone 50 MG tablet Commonly known as:  DESYREL Take 25 mg by mouth at bedtime as needed for sleep.        Allergies:  No Known Allergies    Outstanding Labs/Studies   N/A  Duration of Discharge Encounter   Greater than 30 minutes including physician time.  Signed, Laurann Montana PA-C 11/21/2015, 5:02 PM

## 2015-11-21 NOTE — Progress Notes (Signed)
[id number] Education completed with pt who voiced understanding. Stressed importance of plavix with stent. Reviewed NTG use, ex ed, heart healthy diet and risk factors. Discussed CRP 2 and will refer to [place] program. RN to walk pt prior to discharge. Graylon Good RN BSN 11/21/2015 2:32 PM

## 2015-11-21 NOTE — Progress Notes (Signed)
Cardiac rehab in to see patient.

## 2015-11-21 NOTE — Interval H&P Note (Signed)
Cath Lab Visit (complete for each Cath Lab visit)  Clinical Evaluation Leading to the Procedure:   ACS: No.  Non-ACS:    Anginal Classification: CCS II  Anti-ischemic medical therapy: Minimal Therapy (1 class of medications)  Non-Invasive Test Results: No non-invasive testing performed  Prior CABG: No previous CABG      History and Physical Interval Note:  11/21/2015 9:51 AM  Tyler Hendrix  has presented today for surgery, with the diagnosis of cad  The various methods of treatment have been discussed with the patient and family. After consideration of risks, benefits and other options for treatment, the patient has consented to  Procedure(s): Coronary Stent Intervention (N/A) as a surgical intervention .  The patient's history has been reviewed, patient examined, no change in status, stable for surgery.  I have reviewed the patient's chart and labs.  Questions were answered to the patient's satisfaction.     Sherren Mocha

## 2015-11-22 ENCOUNTER — Encounter (HOSPITAL_COMMUNITY): Payer: Self-pay | Admitting: Cardiovascular Disease

## 2015-11-22 NOTE — Discharge Instructions (Signed)
Same Day PCI D/C Phone Call   [x]   1st call         [x]   Answer.         []   No Answer    []   2nd call        []   Answer       []   No Answer   Is there bleeding or other abnormality from access site?  []   Yes    [x]   No  Has there been any chest pain, nausea or vomiting?    [x]   No  []   Yes chest pain   []   Yes nausea or vomiting  Have you sought any medical attention since discharge?    []   Yes  [x]   No  Remind Patient about their follow up appointment, review discharge & medication instructions.   [x]   Yes  []   No  Are you continuing to take you daily ASA & antiplatlet inhibitor(Plavix, Effient, Brilinitia)?  [x]   Yes  []   No  Would you say you were satisfied with you Same Day Discharge?  (Disagree, Neutral, Agree)   []   Disagree, why  []   Neutral  [x]   Agree  Pt was very complimentary of his stay and his discharge process.  He said he would highly recommend same day discharge./ Libby Maw  Radial Site Care Refer to this sheet in the next few weeks. These instructions provide you with information about caring for yourself after your procedure. Your health care provider may also give you more specific instructions. Your treatment has been planned according to current medical practices, but problems sometimes occur. Call your health care provider if you have any problems or questions after your procedure. WHAT TO EXPECT AFTER THE PROCEDURE After your procedure, it is typical to have the following:  Bruising at the radial site that usually fades within 1-2 weeks.  Blood collecting in the tissue (hematoma) that may be painful to the touch. It should usually decrease in size and tenderness within 1-2 weeks. HOME CARE INSTRUCTIONS  Take medicines only as directed by your health care provider.  You may shower 24-48 hours after the procedure or as directed by your health care provider. Remove the bandage (dressing) and gently wash the site with plain soap and water.  Pat the area dry with a clean towel. Do not rub the site, because this may cause bleeding.  Do not take baths, swim, or use a hot tub until your health care provider approves.  Check your insertion site every day for redness, swelling, or drainage.  Do not apply powder or lotion to the site.  Do not flex or bend the affected arm for 24 hours or as directed by your health care provider.  Do not push or pull heavy objects with the affected arm for 24 hours or as directed by your health care provider.  Do not lift over 10 lb (4.5 kg) for 5 days after your procedure or as directed by your health care provider.  Ask your health care provider when it is okay to:  Return to work or school.  Resume usual physical activities or sports.  Resume sexual activity.  Do not drive home if you are discharged the same day as the procedure. Have someone else drive you.  You may drive 24 hours after the procedure unless otherwise instructed by your health care provider.  Do not operate machinery or power tools for 24 hours after the procedure.  If your  procedure was done as an outpatient procedure, which means that you went home the same day as your procedure, a responsible adult should be with you for the first 24 hours after you arrive home.  Keep all follow-up visits as directed by your health care provider. This is important. SEEK MEDICAL CARE IF:  You have a fever.  You have chills.  You have increased bleeding from the radial site. Hold pressure on the site. SEEK IMMEDIATE MEDICAL CARE IF:  You have unusual pain at the radial site.  You have redness, warmth, or swelling at the radial site.  You have drainage (other than a small amount of blood on the dressing) from the radial site.  The radial site is bleeding, and the bleeding does not stop after 30 minutes of holding steady pressure on the site.  Your arm or hand becomes pale, cool, tingly, or numb.   This information is not  intended to replace advice given to you by your health care provider. Make sure you discuss any questions you have with your health care provider.   Document Released: 03/01/2010 Document Revised: 02/17/2014 Document Reviewed: 08/15/2013 Elsevier Interactive Patient Education [employer].

## 2015-11-28 ENCOUNTER — Encounter: Payer: Self-pay | Admitting: Physician Assistant

## 2015-11-28 ENCOUNTER — Ambulatory Visit (INDEPENDENT_AMBULATORY_CARE_PROVIDER_SITE_OTHER): Payer: BLUE CROSS/BLUE SHIELD | Admitting: Physician Assistant

## 2015-11-28 VITALS — BP 140/77 | HR 49 | Ht 71.0 in | Wt 189.0 lb

## 2015-11-28 DIAGNOSIS — Z950 Presence of cardiac pacemaker: Secondary | ICD-10-CM

## 2015-11-28 DIAGNOSIS — E785 Hyperlipidemia, unspecified: Secondary | ICD-10-CM | POA: Diagnosis not present

## 2015-11-28 DIAGNOSIS — I442 Atrioventricular block, complete: Secondary | ICD-10-CM

## 2015-11-28 DIAGNOSIS — I10 Essential (primary) hypertension: Secondary | ICD-10-CM

## 2015-11-28 DIAGNOSIS — I2510 Atherosclerotic heart disease of native coronary artery without angina pectoris: Secondary | ICD-10-CM | POA: Diagnosis not present

## 2015-11-28 DIAGNOSIS — R5383 Other fatigue: Secondary | ICD-10-CM | POA: Diagnosis not present

## 2015-11-28 MED ORDER — AMLODIPINE BESYLATE 10 MG PO TABS: 10.0000 mg | ORAL_TABLET | Freq: Every day | ORAL | 11 refills | Status: DC

## 2015-11-28 NOTE — Progress Notes (Signed)
Cardiology Office Note    Date:  11/28/2015   ID:  Tyler Hendrix, DOB 1955/08/16, MRN [id number]  PCP:  Rogelia Boga, MD  Cardiologist:  Dr. Rennis Golden Electrophysiologist: Dr. Johney Frame  Chief Complaint  Patient presents with  . Hospitalization Follow-up    seen for Dr. Rennis Golden, post PCI in ost D1 and mid LCx    History of Present Illness:  Tyler Hendrix is a 60 y.o. male with PMH of hodgkin's disease, RBBB and CHB s/p PPM. Patient was previously seen by Dr. Elease Hashimoto in September 2015. He has since been followed by Dr. Rennis Golden since 2016. He was initially evaluated for palpitations symptoms. He wore a 48-hour Holter monitor in May 2016 9 showed PVCs with triplets, quadruplet, and the trigeminy. He underwent Myoview on 08/09/2014 that was low risk, normal perfusion and no ST segment deviation during stress test. The study was not gated due to arrhythmia. He had a echocardiogram in 2015 that showed an ejection fraction of 55%. He was placed on metoprolol for rate control and suppression of PVCs. During his office visit in May 2017, his heart rate is in the upper 40s, however did not feel like he needed a pacemaker is a time. He was seen on 10/04/2015 as a follow-up at which time he complained of some shortness of breath and fatigue, EKG at the time still shows heart rate 44, however appears to be in new complete heart block. His Toprol-XL was stopped. He came back on the following day for reassessment, his heart rate has improved to 88 and that he was in sinus rhythm with first-degree AV block and bifascicular block. Due to persistent fatigue and shortness of breath, he was referred to electrophysiology service and was seen by Dr. Johney Frame on 10/29/2015.   During the EP follow-up, it was noted patient went back into transient complete heart block again and since he was no longer on any AV nodal blocking agent with this occurred, pacemaker was recommended for SSS. As part of his workup, patient  underwent cardiac catheterization before planned pacemaker placement. Cardiac catheterization performed on 11/02/2015 showed severe stenosis of diagonal branch of the LAD severe stenosis of mid left circumflex, moderate diffuse mid RCA and mid to distal LAD stenosis, normal LV function. Even during the cardiac catheterization, he was in 2-1 AV block and intermittent complete heart block. After discussing with the electrophysiology service, he eventually underwent Medtronic dual-chamber pacemaker placement on the same day with plan of staged PCI of first diagonal and mid left circumflex once his pacemaker pocket is healed so he can start on Plavix. He returned to the cath lab on 11/21/2015 and underwent successful 2 vessel PCI of first diagonal and mid left circumflex with Synergy DES (2.25x76mm in ost D1, 3x75mm in mid LCx).  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 He has not had any exertional symptoms or chest pain prior to or since recent stenting. He has been compliant with his medication. He has not seen his PCP of long time. He just had his pacemaker interrogated early October by the device clinic, it was functioning normally. His device is set to pace at 50 bpm heart rate. He does not have any heart failure symptoms. He has never known to have any previous thyroid issue. He has not discussed with his PCP regarding recent elevated TSH. I will obtain fasting lipid panel, complete metabolic panel, repeat TSH  along with free T4.    Past Medical History:  Diagnosis Date  . Abnormal TSH   . CAD in native artery    a. 11/2015: s/p 2 vessel PCI of the first diagonal branch of the LAD in the mid left circumflex with DES.  Marland Kitchen Complete heart block (HCC)    a. s/p Medtronic ppm 10/2015.  Marland Kitchen Hodgkin disease (HCC)    a. s/p chest wall radiation.  . Pacemaker 10/2015   Medtronic  . PVC's (premature ventricular contractions)   . Right bundle branch  block     Past Surgical History:  Procedure Laterality Date  . CARDIAC CATHETERIZATION N/A 11/02/2015   Procedure: Left Heart Cath and Coronary Angiography;  Surgeon: Tonny Bollman, MD;  Location[facility]VASIVE CV LAB;  Service: Cardiovascular;  Laterality: N/A;  . CARDIAC CATHETERIZATION N/A 11/21/2015   Procedure: Coronary Stent Intervention;  Surgeon: Tonny Bollman, MD;  Location: [facility] INVASIVE CV LAB;  Service: Cardiovascular;  Laterality: N/A;  . EP IMPLANTABLE DEVICE N/A 11/02/2015   Procedure: Pacemaker Implant;  Surgeon: Hillis Range, MD;  Location: [facility] INVASIVE CV LAB;  Service: Cardiovascular;  Laterality: N/A;  . KNEE SURGERY Right 1972  . LUMBAR LAMINECTOMY  1996    Current Medications: Outpatient Medications Prior to Visit  Medication Sig Dispense Refill  . aspirin EC 81 MG tablet Take 1 tablet (81 mg total) by mouth daily. 30 tablet 0  . atorvastatin (LIPITOR) 80 MG tablet Take 1 tablet (80 mg total) by mouth daily. 30 tablet 4  . clopidogrel (PLAVIX) 75 MG tablet Take 1 tablet (75 mg total) by mouth daily. 30 tablet 0  . Multiple Vitamin (MULTIVITAMIN) tablet Take 1 tablet by mouth daily.    . nitroGLYCERIN (NITROSTAT) 0.4 MG SL tablet Place 1 tablet (0.4 mg total) under the tongue every 5 (five) minutes as needed for chest pain (up to 3 doses). 25 tablet 3  . traZODone (DESYREL) 50 MG tablet Take 25 mg by mouth at bedtime as needed for sleep.     Marland Kitchen amLODipine (NORVASC) 5 MG tablet Take 1 tablet (5 mg total) by mouth daily. 30 tablet 5  . testosterone (ANDROGEL) 50 MG/5GM (1%) GEL APPLY 1 PACKET ONCE DAILY AS DIRECTED (Patient taking differently: APPLY 1 PACKET EVERY 2 WEEKS) 150 g 0   No facility-administered medications prior to visit.      Allergies:   Review of patient's allergies indicates no known allergies.   Social History   Social History  . Marital status: Married    Spouse name: N/A  . Number of children: 2  . Years of education: Masters   Occupational  History  . lawyer     [place]   Social History Main Topics  . Smoking status: Never Smoker  . Smokeless tobacco: Never Used  . Alcohol use 3.0 oz/week    5 Glasses of wine per week  . Drug use: No  . Sexual activity: Not Asked   Other Topics Concern  . 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   ROS:   Please see the history of present illness.    ROS All other systems reviewed and are negative.   PHYSICAL EXAM:   VS:  BP 140/77   Pulse (!) 49   Ht 5\' 11"  (1.803 m)   Wt  189 lb (85.7 kg)   BMI 26.36 kg/m    GEN: Well nourished, well developed, in no acute distress  HEENT: normal  Neck: no JVD, carotid bruits, or masses Cardiac: RRR; no murmurs, rubs, or gallops,no edema  Respiratory:  clear to auscultation bilaterally, normal work of breathing GI: soft, nontender, nondistended, + BS MS: no deformity or atrophy  Skin: warm and dry, no rash Neuro:  Alert and Oriented x 3, Strength and sensation are intact Psych: euthymic mood, full affect  Wt Readings from Last 3 Encounters:  11/28/15 189 lb (85.7 kg)  11/21/15 192 lb (87.1 kg)  11/03/15 183 lb 12.8 oz (83.4 kg)      Studies/Labs Reviewed:   EKG:  EKG is ordered today.  The ekg ordered today demonstrates AV paced rhythm  Recent Labs: 10/29/2015: TSH 9.07 11/21/2015: BUN 12; Creatinine, Ser 0.95; Hemoglobin 14.2; Platelets 274; Potassium 4.2; Sodium 140   Lipid Panel    Component Value Date/Time   CHOL 146 04/20/2014 0848   TRIG 191.0 (H) 04/20/2014 0848   HDL 33.50 (L) 04/20/2014 0848   CHOLHDL 4 04/20/2014 0848   VLDL 38.2 04/20/2014 0848   LDLCALC 74 04/20/2014 0848   LDLDIRECT 88.6 10/17/2011 0846    Additional studies/ records that were reviewed today include:   48 hour Holter Monitor 07/03/2014 PVC's - triplets, quadrigeminy and  trigeminy  Myoview 08/09/2014 Study Highlights    The study is normal.  This is a low risk study.  No T wave inversion was noted during stress.  There was no ST segment deviation noted during stress.   Normal perfusion. Study was not gated due to arrhythmia. Systolic and diastolic hypertension was noted during the study.    Cath 11/02/2015 Conclusion   1. Severe stenosis of the diagonal branches of the LAD 2. Severe stenosis of the midcircumflex 3. Moderate diffuse mid RCA and mid to distal LAD stenosis 4. Normal LV function by noninvasive assessment  Discussed case with Dr. Johney Frame. The patient is in and out of 2-1 AV block and complete heart block during his heart catheterization procedure. We discussed management of his coronary disease in the context of permanent pacemaker indication. His first diagonal branch and mid-circumflex are amenable to PCI. Plan for permanent pacemaker placement today and return for PCI of the first diagonal and mid circumflex once his pacemaker pocket is healed and he can start on Plavix.     PPM placement by Dr. Johney Frame 11/02/2015 PREPROCEDURE DIAGNOSIS:  Symptomatic complete heart block    POSTPROCEDURE DIAGNOSIS:  Symptomatic complete heart block     PROCEDURES:   1. Left upper extremity venography.   2. Pacemaker implantation.   3. His bundle recording and pacing    Medtronic Advisa DR MRI SureScan model A2DR01(serial number [id number]) pacemaker Medtronic model model 3830 His bundole lead was advanced but induced a RBBB morphology, therefore decided not to pace his HIS bungle.  Medtronic [phone] (serial number [id number]) right atrial lead  Medtronic model 5076- 58 (serial number [id number]) right ventricular lead     ASSESSMENT:    1. Coronary artery disease involving native coronary artery of native heart without angina pectoris   2. Essential hypertension   3. Other fatigue   4. Hyperlipidemia, unspecified hyperlipidemia type    5. Complete heart block (HCC)   6. Cardiac pacemaker      PLAN:  In order of problems listed above:  1. CAD: Underwent DES to mid left circumflex and also ostial  D1, has not had any significant chest discomfort. EKG today shows paced rhythm.  2. CHB s/p Medtronic Dual Chamber PPM: Today's EKG shows patient is AV paced, heart rate 50 which is his pacing heart rate. He has upcoming visit with Dr. Johney Frame in December  3. Hypertension: Blood pressure uncontrolled at 140, will increase amlodipine to 10 mg daily.  4. Hyperlipidemia: Continue Lipitor, plan fasting lipid panel and BMP.  5. Elevated TSH: We will obtain outpatient repeat TSH and free T4 to see patient has hypothyroidism. (This was reviewed prior to the placement, the elevated TSH was felt not to be a direct cause for his complete heart block.)    Medication Adjustments/Labs and Tests Ordered: Current medicines are reviewed at length with the patient today.  Concerns regarding medicines are outlined above.  Medication changes, Labs and Tests ordered today are listed in the Patient Instructions below. Patient Instructions  Medication Instructions:  INCREASE- Amlodipine 10 mg daily  Labwork: Fasting Lipids, CMP, TSH, Free T4  Testing/Procedures: None Ordered  Follow-Up: Your physician recommends that you schedule a follow-up appointment in: Keep appointment with Dr Rennis Golden on November 21st @ 3:45 pm   Any Other Special Instructions Will Be Listed Below (If Applicable).   If you need a refill on your cardiac medications before your next appointment, please call your pharmacy.      Ramond Dial, [place]  11/28/2015 2:21 PM    [facility] [address] [place], [place], [place]  [address] Phone: [phone]; Fax: [phone]

## 2015-11-28 NOTE — Patient Instructions (Addendum)
Medication Instructions:  INCREASE- Amlodipine 10 mg daily  Labwork: Fasting Lipids, CMP, TSH, Free T4  Testing/Procedures: None Ordered  Follow-Up: Your physician recommends that you schedule a follow-up appointment in: Keep appointment with Dr Debara Pickett on November 21st @ 3:45 pm   Any Other Special Instructions Will Be Listed Below (If Applicable).   If you need a refill on your cardiac medications before your next appointment, please call your pharmacy.

## 2015-12-06 ENCOUNTER — Telehealth (HOSPITAL_COMMUNITY): Payer: Self-pay | Admitting: *Deleted

## 2015-12-06 NOTE — Telephone Encounter (Signed)
-----   Message from Patsey Berthold, NP sent at 12/06/2015  3:08 PM EDT ----- Regarding: RE: Activity restricitions at Cardiac rehab [place] to participate without restrictions.  [place]  ----- Message ----- From: Rowe Pavy, RN Sent: 12/06/2015   2:02 PM To: [name], MD, Patsey Berthold, NP Subject: Activity restricitions at Cardiac rehab        The above pt is scheduled to begin participating in cardiac rehab on November 9th.  Pt had a PPM on 9/22 with f/u in the device clinic on 10/4.  May pt participate in group exercise?  If so, any restrictions for his exercise program.  Thanks for the input. Maurice Small RN

## 2015-12-07 ENCOUNTER — Ambulatory Visit (INDEPENDENT_AMBULATORY_CARE_PROVIDER_SITE_OTHER): Payer: BLUE CROSS/BLUE SHIELD | Admitting: Family Medicine

## 2015-12-07 ENCOUNTER — Encounter: Payer: Self-pay | Admitting: Family Medicine

## 2015-12-07 VITALS — BP 104/70 | HR 57 | Temp 98.4°F | Ht 71.0 in | Wt 187.6 lb

## 2015-12-07 DIAGNOSIS — J069 Acute upper respiratory infection, unspecified: Secondary | ICD-10-CM

## 2015-12-07 DIAGNOSIS — Z23 Encounter for immunization: Secondary | ICD-10-CM | POA: Diagnosis not present

## 2015-12-07 MED ORDER — BENZONATATE 100 MG PO CAPS: 100.0000 mg | ORAL_CAPSULE | Freq: Two times a day (BID) | ORAL | 0 refills | Status: DC | PRN

## 2015-12-07 NOTE — Patient Instructions (Signed)
BEFORE YOU LEAVE: -flu shot   INSTRUCTIONS FOR UPPER RESPIRATORY INFECTION:  -plenty of rest and fluids  -nasal saline wash 2-3 times daily (use prepackaged nasal saline or bottled/distilled water if making your own)   -can use AFRIN nasal spray for drainage and nasal congestion - but do NOT use longer then 3-4 days  -can use tylenol as directed for aches and sorethroat  -in the winter time, using a humidifier at night is helpful (please follow cleaning instructions)  -if you are taking a cough medication - use only as directed, may also try a teaspoon of honey to coat the throat and throat lozenges. .  -for sore throat, salt water gargles can help  -follow up if you have fevers, facial pain, tooth pain, difficulty breathing or are worsening or symptoms persist longer then expected  Upper Respiratory Infection, Adult An upper respiratory infection (URI) is also known as the common cold. It is often caused by a type of germ (virus). Colds are easily spread (contagious). You can pass it to others by kissing, coughing, sneezing, or drinking out of the same glass. Usually, you get better in 1 to 3  weeks.  However, the cough can last for even longer. HOME CARE   Only take medicine as told by your doctor. Follow instructions provided above.  Drink enough water and fluids to keep your pee (urine) clear or pale yellow.  Get plenty of rest.  Return to work when your temperature is < 100 for 24 hours or as told by your doctor. You may use a face mask and wash your hands to stop your cold from spreading. GET HELP RIGHT AWAY IF:   After the first few days, you feel you are getting worse.  You have questions about your medicine.  You have chills, shortness of breath, or red spit (mucus).  You have pain in the face for more then 1-2 days, especially when you bend forward.  You have a fever, puffy (swollen) neck, pain when you swallow, or white spots in the back of your throat.  You  have a bad headache, ear pain, sinus pain, or chest pain.  You have a high-pitched whistling sound when you breathe in and out (wheezing).  You cough up blood.  You have sore muscles or a stiff neck. MAKE SURE YOU:   Understand these instructions.  Will watch your condition.  Will get help right away if you are not doing well or get worse. Document Released: 07/16/2007 Document Revised: 04/21/2011 Document Reviewed: 05/04/2013 [facility] Patient Information 2015 [place], [place]. This information is not intended to replace advice given to you by your health care provider. Make sure you discuss any questions you have with your health care provider.

## 2015-12-07 NOTE — Progress Notes (Signed)
Pre visit review using our clinic review tool, if applicable. No additional management support is needed unless otherwise documented below in the visit note. 

## 2015-12-07 NOTE — Progress Notes (Signed)
HPI:  Acute visit for URI: -started: 3 days ago -symptoms:nasal congestion, sore throat, cough -denies:fever, SOB, NVD, tooth pain, sinus pain, wheezing -has tried: musinex -sick contacts/travel/risks: no reported flu, strep or tick exposure - wife with the same  ROS: See pertinent positives and negatives per HPI.  Past Medical History:  Diagnosis Date  . Abnormal TSH   . CAD in native artery    a. 11/2015: s/p 2 vessel PCI of the first diagonal branch of the LAD in the mid left circumflex with DES.  Marland Kitchen Complete heart block (HCC)    a. s/p Medtronic ppm 10/2015.  Marland Kitchen Hodgkin disease ([place])    a. s/p chest wall radiation.  . Pacemaker 10/2015   Medtronic  . PVC's (premature ventricular contractions)   . Right bundle branch block     Past Surgical History:  Procedure Laterality Date  . CARDIAC CATHETERIZATION N/A 11/02/2015   Procedure: Left Heart Cath and Coronary Angiography;  Surgeon: Sherren Mocha, MD;  Location: [place] CV LAB;  Service: Cardiovascular;  Laterality: N/A;  . CARDIAC CATHETERIZATION N/A 11/21/2015   Procedure: Coronary Stent Intervention;  Surgeon: Sherren Mocha, MD;  Location: [place] CV LAB;  Service: Cardiovascular;  Laterality: N/A;  . EP IMPLANTABLE DEVICE N/A 11/02/2015   Procedure: Pacemaker Implant;  Surgeon: [name], MD;  Location: [place] CV LAB;  Service: Cardiovascular;  Laterality: N/A;  . KNEE SURGERY Right 1972  . LUMBAR LAMINECTOMY  1996    Family History  Problem Relation Age of Onset  . Cancer      prostate father hx  . Heart disease Mother   . Heart disease Father   . Colon cancer Neg Hx     Social History   Social History  . Marital status: Married    Spouse name: N/A  . Number of children: 2  . 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 Smoking status: Never Smoker  . Smokeless tobacco: Never Used  . Alcohol use  3.0 oz/week    5 Glasses of wine per week  . Drug use: No  . Sexual activity: Not Asked   Other Topics Concern  . 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  amLODipine (NORVASC) 10 MG tablet, Take 1 tablet (10 mg total) by mouth daily., Disp: 30 tablet, Rfl: 11 .  aspirin EC 81 MG tablet, Take 1 tablet (81 mg total) by mouth daily., Disp: 30 tablet, Rfl: 0 .  atorvastatin (LIPITOR) 80 MG tablet, Take 1 tablet (80 mg total) by mouth daily., Disp: 30 tablet, Rfl: 4 .  clopidogrel (PLAVIX) 75 MG tablet, Take 1 tablet (75 mg total) by mouth daily., Disp: 30 tablet, Rfl: 0 .  Multiple Vitamin (MULTIVITAMIN) tablet, Take 1 tablet by mouth daily., Disp: , Rfl:  .  nitroGLYCERIN (NITROSTAT) 0.4 MG SL tablet, Place 1 tablet (0.4 mg total) under the tongue every 5 (five) minutes as needed for chest pain (up to 3 doses)., Disp: 25 tablet, Rfl: 3 .  traZODone (DESYREL) 50 MG tablet, Take 25 mg by mouth at bedtime as needed for sleep. , Disp: , Rfl:  .  benzonatate (TESSALON) 100 MG capsule, Take 1 capsule (100 mg total) by mouth 2 (two) times daily as needed for cough., Disp: 20 capsule,  Rfl: 0  EXAM:  Vitals:   12/07/15 1416  BP: 104/70  Pulse: (!) 57  Temp: 98.4 F (36.9 C)    Body mass index is 26.16 kg/m.  GENERAL: vitals reviewed and listed above, alert, oriented, appears well hydrated and in no acute distress  HEENT: atraumatic, conjunttiva clear, no obvious abnormalities on inspection of external nose and ears, normal appearance of ear canals and TMs, clear nasal congestion, mild post oropharyngeal erythema with PND, no tonsillar edema or exudate, no sinus TTP  NECK: no obvious masses on inspection  LUNGS: clear to auscultation bilaterally, no wheezes, rales or rhonchi, good air movement  CV: HRRR, no peripheral edema  MS: moves all extremities without noticeable abnormality  PSYCH: pleasant and cooperative, no  obvious depression or anxiety  ASSESSMENT AND PLAN:  Discussed the following assessment and plan:  Acute upper respiratory infection  -given HPI and exam findings today, a serious infection or illness is unlikely. We discussed potential etiologies, with VURI being most likely, and advised supportive care and monitoring. We discussed treatment side effects, likely course, antibiotic misuse, transmission, and signs of developing a serious illness. -of course, we advised to return or notify a doctor immediately if symptoms worsen or persist or new concerns arise.    Patient Instructions  BEFORE YOU LEAVE: -flu shot   INSTRUCTIONS FOR UPPER RESPIRATORY INFECTION:  -plenty of rest and fluids  -nasal saline wash 2-3 times daily (use prepackaged nasal saline or bottled/distilled water if making your own)   -can use AFRIN nasal spray for drainage and nasal congestion - but do NOT use longer then 3-4 days  -can use tylenol as directed for aches and sorethroat  -in the winter time, using a humidifier at night is helpful (please follow cleaning instructions)  -if you are taking a cough medication - use only as directed, may also try a teaspoon of honey to coat the throat and throat lozenges. .  -for sore throat, salt water gargles can help  -follow up if you have fevers, facial pain, tooth pain, difficulty breathing or are worsening or symptoms persist longer then expected  Upper Respiratory Infection, Adult An upper respiratory infection (URI) is also known as the common cold. It is often caused by a type of germ (virus). Colds are easily spread (contagious). You can pass it to others by kissing, coughing, sneezing, or drinking out of the same glass. Usually, you get better in 1 to 3  weeks.  However, the cough can last for even longer. HOME CARE   Only take medicine as told by your doctor. Follow instructions provided above.  Drink enough water and fluids to keep your pee (urine)  clear or pale yellow.  Get plenty of rest.  Return to work when your temperature is < 100 for 24 hours or as told by your doctor. You may use a face mask and wash your hands to stop your cold from spreading. GET HELP RIGHT AWAY IF:   After the first few days, you feel you are getting worse.  You have questions about your medicine.  You have chills, shortness of breath, or red spit (mucus).  You have pain in the face for more then 1-2 days, especially when you bend forward.  You have a fever, puffy (swollen) neck, pain when you swallow, or white spots in the back of your throat.  You have a bad headache, ear pain, sinus pain, or chest pain.  You have a high-pitched whistling sound when you  breathe in and out (wheezing).  You cough up blood.  You have sore muscles or a stiff neck. MAKE SURE YOU:   Understand these instructions.  Will watch your condition.  Will get help right away if you are not doing well or get worse. Document Released: 07/16/2007 Document Revised: 04/21/2011 Document Reviewed: 05/04/2013 [facility] Patient Information 2015 [place], [place]. This information is not intended to replace advice given to you by your health care provider. Make sure you discuss any questions you have with your health care provider.     Colin Benton R., DO

## 2015-12-17 ENCOUNTER — Telehealth (HOSPITAL_COMMUNITY): Payer: Self-pay | Admitting: *Deleted

## 2015-12-20 ENCOUNTER — Encounter (HOSPITAL_COMMUNITY)
Admission: RE | Admit: 2015-12-20 | Discharge: 2015-12-20 | Disposition: A | Discharge status: Home / Self Care | Payer: BLUE CROSS/BLUE SHIELD | Source: Ambulatory Visit | Attending: Internal Medicine | Admitting: Internal Medicine

## 2015-12-20 ENCOUNTER — Encounter (HOSPITAL_COMMUNITY): Payer: Self-pay

## 2015-12-20 VITALS — BP 126/74 | HR 60 | Ht 72.0 in | Wt 188.5 lb

## 2015-12-20 DIAGNOSIS — Z955 Presence of coronary angioplasty implant and graft: Secondary | ICD-10-CM | POA: Diagnosis present

## 2015-12-20 NOTE — Progress Notes (Signed)
Cardiac Individual Treatment Plan  Patient Details  Name: Tyler Hendrix MRN: [id number] Date of Birth: [birth year] Referring Provider:   Flowsheet Row CARDIAC REHAB PHASE II ORIENTATION from 12/20/2015 in [facility] CARDIAC [facility]  Referring Provider  Hilty, [place] MD      Initial Encounter Date:  Flowsheet Row CARDIAC REHAB PHASE II ORIENTATION from 12/20/2015 in [facility] CARDIAC REHAB  Date  12/20/15  Referring Provider  Hilty, [place] MD      Visit Diagnosis: 11/21/15 Status post coronary artery stent placement  Patient's Home Medications on Admission:  Current Outpatient Prescriptions:  .  amLODipine (NORVASC) 10 MG tablet, Take 1 tablet (10 mg total) by mouth daily., Disp: 30 tablet, Rfl: 11 .  aspirin EC 81 MG tablet, Take 1 tablet (81 mg total) by mouth daily., Disp: 30 tablet, Rfl: 0 .  atorvastatin (LIPITOR) 80 MG tablet, Take 1 tablet (80 mg total) by mouth daily., Disp: 30 tablet, Rfl: 4 .  clopidogrel (PLAVIX) 75 MG tablet, Take 1 tablet (75 mg total) by mouth daily., Disp: 30 tablet, Rfl: 0 .  Multiple Vitamin (MULTIVITAMIN) tablet, Take 1 tablet by mouth daily., Disp: , Rfl:  .  nitroGLYCERIN (NITROSTAT) 0.4 MG SL tablet, Place 1 tablet (0.4 mg total) under the tongue every 5 (five) minutes as needed for chest pain (up to 3 doses)., Disp: 25 tablet, Rfl: 3 .  NON FORMULARY, Take 3 tablets by mouth daily., Disp: , Rfl:  .  traZODone (DESYREL) 50 MG tablet, Take 25 mg by mouth at bedtime as needed for sleep. , Disp: , Rfl:   Past Medical History: Past Medical History:  Diagnosis Date  . Abnormal TSH   . CAD in native artery    a. 11/2015: s/p 2 vessel PCI of the first diagonal branch of the LAD in the mid left circumflex with DES.  Marland Kitchen Complete heart block (HCC)    a. s/p Medtronic ppm 10/2015.  Marland Kitchen Hodgkin disease (HCC)    a. s/p chest wall radiation.  . Pacemaker 10/2015   Medtronic  . PVC's (premature ventricular contractions)   .  Right bundle branch block     Tobacco Use: History  Smoking Status  . Never Smoker  Smokeless Tobacco  . Never Used    Labs: Recent Review Flowsheet Data    Labs for ITP Cardiac and Pulmonary Rehab Latest Ref Rng & Units 03/17/2008 03/16/2009 05/20/2010 10/17/2011 04/20/2014   Cholestrol 0 - 200 mg/dL 562 [phone]   LDLCALC 0 - 99 mg/dL 65 70 98 - 74   LDLDIRECT mg/dL - - - 29.5 -   HDL >28.41 mg/dL 32(G) 40(N) 02.72(Z) 36.64(Q) 33.50(L)   Trlycerides 0.0 - 149.0 mg/dL 034(V) 425(Z) 563.0(H) 384.0(H) 191.0(H)      Capillary Blood Glucose: Lab Results  Component Value Date   GLUCAP 101 (H) 05/26/2008     Exercise Target Goals: Date: 12/20/15  Exercise Program Goal: Individual exercise prescription set with THRR, safety & activity barriers. Participant demonstrates ability to understand and report RPE using BORG scale, to self-measure pulse accurately, and to acknowledge the importance of the exercise prescription.  Exercise Prescription Goal: Starting with aerobic activity 30 plus minutes a day, 3 days per week for initial exercise prescription. Provide home exercise prescription and guidelines that participant acknowledges understanding prior to discharge.  Activity Barriers & Risk Stratification:     Activity Barriers & Cardiac Risk Stratification - 12/20/15 1718  Activity Barriers & Cardiac Risk Stratification   Activity Barriers Back Problems      6 Minute Walk:     6 Minute Walk    Row Name 12/20/15 1514         6 Minute Walk   Phase Initial     Distance 2084 feet     Walk Time 6 minutes     # of Rest Breaks 0     MPH 3.9     METS 4.6     RPE 12     VO2 Peak 16.07     Symptoms No     Resting HR 60 bpm     Resting BP 126/74     Max Ex. HR 110 bpm     Max Ex. BP 152/72     2 Minute Post BP 114/78        Initial Exercise Prescription:     Initial Exercise Prescription - 12/20/15 1500      Date of Initial Exercise RX and Referring  Provider   Date 12/20/15   Referring Provider Hilty, [place] MD     Treadmill   MPH 3.2   Grade 2   Minutes 10   METs 4.33     Bike   Level 1.2   Minutes 10   METs 3.66     NuStep   Level 3   Minutes 10   METs 3     Prescription Details   Frequency (times per week) 3   Duration Progress to 45 minutes of aerobic exercise without signs/symptoms of physical distress     Intensity   THRR 40-80% of Max Heartrate 64-129   Ratings of Perceived Exertion 11-13   Perceived Dyspnea 0-4     Progression   Progression Continue to progress workloads to maintain intensity without signs/symptoms of physical distress.     Resistance Training   Training Prescription Yes   Weight 5   Reps 10-12      Perform Capillary Blood Glucose checks as needed.  Exercise Prescription Changes:   Exercise Comments:   Discharge Exercise Prescription (Final Exercise Prescription Changes):   Nutrition:  Target Goals: Understanding of nutrition guidelines, daily intake of sodium 1500mg , cholesterol 200mg , calories 30% from fat and 7% or less from saturated fats, daily to have 5 or more servings of fruits and vegetables.  Biometrics:     Pre Biometrics - 12/20/15 1549      Pre Biometrics   Waist Circumference 41 inches   Hip Circumference 42.5 inches   Waist to Hip Ratio 0.96 %   Triceps Skinfold 10 mm   % Body Fat 25 %   Grip Strength 47 kg   Flexibility 0 in   Single Leg Stand 30 seconds       Nutrition Therapy Plan and Nutrition Goals:   Nutrition Discharge: Nutrition Scores:   Nutrition Goals Re-Evaluation:   Psychosocial: Target Goals: Acknowledge presence or absence of depression, maximize coping skills, provide positive support system. Participant is able to verbalize types and ability to use techniques and skills needed for reducing stress and depression.  Initial Review & Psychosocial Screening:     Initial Psych Review & Screening - 12/20/15 1717      Initial  Review   Current issues with Current Stress Concerns   Source of Stress Concerns Occupation   Comments Pt is the sole provider for the household, pt has several children with the youngest being 27month old. Pt is a Clinical research associate.  Family Dynamics   Good Support System? Yes      Quality of Life Scores:     Quality of Life - 12/20/15 1549      Quality of Life Scores   Health/Function Pre 18.3 %   Socioeconomic Pre 21.43 %   Psych/Spiritual Pre 22.43 %   Family Pre 24.9 %   GLOBAL Pre 20.76 %      PHQ-9: Recent Review Flowsheet Data    Depression screen [facility] 2/9 04/27/2014   Decreased Interest 0   Down, Depressed, Hopeless 0   PHQ - 2 Score 0      Psychosocial Evaluation and Intervention:     Psychosocial Evaluation - 12/20/15 1717      Discharge Psychosocial Assessment & Intervention   Discharge Continue support measures as needed      Psychosocial Re-Evaluation:   Vocational Rehabilitation: Provide vocational rehab assistance to qualifying candidates.   Vocational Rehab Evaluation & Intervention:     Vocational Rehab - 12/20/15 1718      Initial Vocational Rehab Evaluation & Intervention   Assessment shows need for Vocational Rehabilitation No  Pt feels he will be able to return back to work with no difficulty.      Education: Education Goals: Education classes will be provided on a weekly basis, covering required topics. Participant will state understanding/return demonstration of topics presented.  Learning Barriers/Preferences:     Learning Barriers/Preferences - 12/20/15 0817      Learning Barriers/Preferences   Learning Barriers None   Learning Preferences Verbal Instruction;Skilled Demonstration;Audio      Education Topics: Count Your Pulse:  -Group instruction provided by verbal instruction, demonstration, patient participation and written materials to support subject.  Instructors address importance of being able to find your pulse and how  to count your pulse when at home without a heart monitor.  Patients get hands on experience counting their pulse with staff help and individually.   Heart Attack, Angina, and Risk Factor Modification:  -Group instruction provided by verbal instruction, video, and written materials to support subject.  Instructors address signs and symptoms of angina and heart attacks.    Also discuss risk factors for heart disease and how to make changes to improve heart health risk factors.   Functional Fitness:  -Group instruction provided by verbal instruction, demonstration, patient participation, and written materials to support subject.  Instructors address safety measures for doing things around the house.  Discuss how to get up and down off the floor, how to pick things up properly, how to safely get out of a chair without assistance, and balance training.   Meditation and Mindfulness:  -Group instruction provided by verbal instruction, patient participation, and written materials to support subject.  Instructor addresses importance of mindfulness and meditation practice to help reduce stress and improve awareness.  Instructor also leads participants through a meditation exercise.    Stretching for Flexibility and Mobility:  -Group instruction provided by verbal instruction, patient participation, and written materials to support subject.  Instructors lead participants through series of stretches that are designed to increase flexibility thus improving mobility.  These stretches are additional exercise for major muscle groups that are typically performed during regular warm up and cool down.   Hands Only CPR Anytime:  -Group instruction provided by verbal instruction, video, patient participation and written materials to support subject.  Instructors co-teach with AHA video for hands only CPR.  Participants get hands on experience with mannequins.   Nutrition I class: Heart Healthy  Eating:  -Group  instruction provided by PowerPoint slides, verbal discussion, and written materials to support subject matter. The instructor gives an explanation and review of the Therapeutic Lifestyle Changes diet recommendations, which includes a discussion on lipid goals, dietary fat, sodium, fiber, plant stanol/sterol esters, sugar, and the components of a well-balanced, healthy diet.   Nutrition II class: Lifestyle Skills:  -Group instruction provided by PowerPoint slides, verbal discussion, and written materials to support subject matter. The instructor gives an explanation and review of label reading, grocery shopping for heart health, heart healthy recipe modifications, and ways to make healthier choices when eating out.   Diabetes Question & Answer:  -Group instruction provided by PowerPoint slides, verbal discussion, and written materials to support subject matter. The instructor gives an explanation and review of diabetes co-morbidities, pre- and post-prandial blood glucose goals, pre-exercise blood glucose goals, signs, symptoms, and treatment of hypoglycemia and hyperglycemia, and foot care basics.   Diabetes Blitz:  -Group instruction provided by PowerPoint slides, verbal discussion, and written materials to support subject matter. The instructor gives an explanation and review of the physiology behind type 1 and type 2 diabetes, diabetes medications and rational behind using different medications, pre- and post-prandial blood glucose recommendations and Hemoglobin A1c goals, diabetes diet, and exercise including blood glucose guidelines for exercising safely.    Portion Distortion:  -Group instruction provided by PowerPoint slides, verbal discussion, written materials, and food models to support subject matter. The instructor gives an explanation of serving size versus portion size, changes in portions sizes over the last 20 years, and what consists of a serving from each food group.   Stress  Management:  -Group instruction provided by verbal instruction, video, and written materials to support subject matter.  Instructors review role of stress in heart disease and how to cope with stress positively.     Exercising on Your Own:  -Group instruction provided by verbal instruction, power point, and written materials to support subject.  Instructors discuss benefits of exercise, components of exercise, frequency and intensity of exercise, and end points for exercise.  Also discuss use of nitroglycerin and activating EMS.  Review options of places to exercise outside of rehab.  Review guidelines for sex with heart disease.   Cardiac Drugs I:  -Group instruction provided by verbal instruction and written materials to support subject.  Instructor reviews cardiac drug classes: antiplatelets, anticoagulants, beta blockers, and statins.  Instructor discusses reasons, side effects, and lifestyle considerations for each drug class.   Cardiac Drugs II:  -Group instruction provided by verbal instruction and written materials to support subject.  Instructor reviews cardiac drug classes: angiotensin converting enzyme inhibitors (ACE-I), angiotensin II receptor blockers (ARBs), nitrates, and calcium channel blockers.  Instructor discusses reasons, side effects, and lifestyle considerations for each drug class.   Anatomy and Physiology of the Circulatory System:  -Group instruction provided by verbal instruction, video, and written materials to support subject.  Reviews functional anatomy of heart, how it relates to various diagnoses, and what role the heart plays in the overall system.   Knowledge Questionnaire Score:     Knowledge Questionnaire Score - 12/20/15 1555      Knowledge Questionnaire Score   Pre Score 17/24      Core Components/Risk Factors/Patient Goals at Admission:     Personal Goals and Risk Factors at Admission - 12/20/15 0825      Core Components/Risk Factors/Patient  Goals on Admission   Increase Strength and Stamina Yes   Intervention Provide  advice, education, support and counseling about physical activity/exercise needs.;Develop an individualized exercise prescription for aerobic and resistive training based on initial evaluation findings, risk stratification, comorbidities and participant's personal goals.   Expected Outcomes Achievement of increased cardiorespiratory fitness and enhanced flexibility, muscular endurance and strength shown through measurements of functional capacity and personal statement of participant.   Hypertension Yes   Intervention Provide education on lifestyle modifcations including regular physical activity/exercise, weight management, moderate sodium restriction and increased consumption of fresh fruit, vegetables, and low fat dairy, alcohol moderation, and smoking cessation.;Monitor prescription use compliance.   Expected Outcomes Short Term: Continued assessment and intervention until BP is < 140/74mm HG in hypertensive participants. < 130/57mm HG in hypertensive participants with diabetes, heart failure or chronic kidney disease.;Long Term: Maintenance of blood pressure at goal levels.   Stress Yes   Intervention Offer individual and/or small group education and counseling on adjustment to heart disease, stress management and health-related lifestyle change. Teach and support self-help strategies.;Refer participants experiencing significant psychosocial distress to appropriate mental health specialists for further evaluation and treatment. When possible, include family members and significant others in education/counseling sessions.   Expected Outcomes Short Term: Participant demonstrates changes in health-related behavior, relaxation and other stress management skills, ability to obtain effective social support, and compliance with psychotropic medications if prescribed.;Long Term: Emotional wellbeing is indicated by absence of  clinically significant psychosocial distress or social isolation.   Personal Goal Other Yes   Personal Goal improve cardiovascular fitness and increase strength   Intervention Individualized fitness program and educational classes (medication management, functional fitness, nutritional guidance, ect)   Expected Outcomes Improve overall fitness capacity       Core Components/Risk Factors/Patient Goals Review:    Core Components/Risk Factors/Patient Goals at Discharge (Final Review):    ITP Comments:     ITP Comments    Row Name 12/20/15 0825           ITP Comments Armanda Magic MD          Comments:  Pt in today for cardiac rehab orientation from 0800 to 1030.  Pt placed on telemetry monitor upon arrival.  As a part of the orientation appointment, pt completed 5 minutes of warm up stretches with the exercise physiologist and completed 6 minute walk test.  Pt tolerated well with no complaints. Monitor showed 100% ventricular paced. Pt is looking forward to participating in cardiac rehab on next week. Karlene Lineman RN

## 2015-12-20 NOTE — Progress Notes (Signed)
Cardiac Rehab Medication Review by a Pharmacist  Does the patient  feel that his/her medications are working for him/her?  yes  Has the patient been experiencing any side effects to the medications prescribed?  no  Does the patient measure his/her own blood pressure or blood glucose at home?  yes   Does the patient have any problems obtaining medications due to transportation or finances?   no  Understanding of regimen: excellent Understanding of indications: excellent Potential of compliance: excellent    Pharmacist comments: Patient is a 60 yo male who presents to cardiac rehab in good spirits. He understands his medications well and his wife was an Therapist, sports who is able to help with blood pressure monitoring when needed. No major questions or concerns. Did discuss possibility of drug interactions with taking an herbal supplement that we do not know the ingredients of, but he has been taking this for many years and will continue to take it.   Demetrius Charity, PharmD Acute Care Pharmacy Resident  Pager: [phone] 12/20/2015

## 2015-12-24 ENCOUNTER — Encounter (HOSPITAL_COMMUNITY): Payer: BLUE CROSS/BLUE SHIELD

## 2015-12-26 ENCOUNTER — Encounter (HOSPITAL_COMMUNITY)
Admission: RE | Admit: 2015-12-26 | Discharge: 2015-12-26 | Disposition: A | Discharge status: Home / Self Care | Payer: BLUE CROSS/BLUE SHIELD | Source: Ambulatory Visit | Attending: Internal Medicine | Admitting: Internal Medicine

## 2015-12-26 ENCOUNTER — Encounter (HOSPITAL_COMMUNITY): Payer: BLUE CROSS/BLUE SHIELD

## 2015-12-26 ENCOUNTER — Encounter (HOSPITAL_COMMUNITY): Payer: Self-pay

## 2015-12-26 VITALS — Ht 71.0 in | Wt 187.6 lb

## 2015-12-26 DIAGNOSIS — Z955 Presence of coronary angioplasty implant and graft: Secondary | ICD-10-CM | POA: Diagnosis not present

## 2015-12-26 NOTE — Progress Notes (Signed)
Daily Session Note  Patient Details  Name: Tyler Hendrix MRN: [id number] Date of Birth: Oct 26, 1955 Referring Provider:   Flowsheet Row CARDIAC REHAB PHASE II ORIENTATION from 12/20/2015 in [place]  Referring Provider  Hilty, [place] MD      Encounter Date: 12/26/2015  Check In:     Session Check In - 12/26/15 0918      Check-In   Location [facility]   Staff Present Dorna Bloom, MS, ACSM RCEP, Exercise Physiologist;Carlette Paw Paw, RN, Marga Melnick, RN, BSN;[name] Gastelum, RN, BSN   Supervising physician immediately available to respond to emergencies [facility] immediately available   Physician(s) Dr. Grandville Silos   Medication changes reported     No   Fall or balance concerns reported    No   Warm-up and Cool-down Performed as group-led instruction   Resistance Training Performed No   VAD Patient? No     Pain Assessment   Currently in Pain? No/denies      Capillary Blood Glucose: No results found for this or any previous visit (from the past 24 hour(s)).   Goals Met:  Exercise tolerated well  Goals Unmet:  Not Applicable  Comments: Pt started cardiac rehab today.  Pt tolerated light exercise without difficulty. VSS, telemetry-paced asymptomatic.  Medication list reconciled. Pt denies barriers to medicaiton compliance.  PSYCHOSOCIAL ASSESSMENT:  PHQ-0. Pt exhibits positive coping skills, hopeful outlook with supportive family. No psychosocial needs identified at this time, no psychosocial interventions necessary.    Pt enjoys spending time with his young children, ages 46.8. And 13  Months.  Pt also enjoys reading, doing yard work and going to [employer].  Pt goals for cardiac rehab are to increase his cardiovascular strength and stamina.  Pt oriented to exercise equipment and routine.    Understanding verbalized.   Dr. Fransico Him is Medical Director for Cardiac Rehab at [facility].

## 2015-12-28 ENCOUNTER — Encounter (HOSPITAL_COMMUNITY): Payer: BLUE CROSS/BLUE SHIELD

## 2015-12-28 ENCOUNTER — Encounter (HOSPITAL_COMMUNITY)
Admission: RE | Admit: 2015-12-28 | Discharge: 2015-12-28 | Disposition: A | Discharge status: Home / Self Care | Payer: BLUE CROSS/BLUE SHIELD | Source: Ambulatory Visit | Attending: Internal Medicine | Admitting: Internal Medicine

## 2015-12-28 DIAGNOSIS — Z955 Presence of coronary angioplasty implant and graft: Secondary | ICD-10-CM

## 2015-12-31 ENCOUNTER — Encounter (HOSPITAL_COMMUNITY): Payer: BLUE CROSS/BLUE SHIELD

## 2015-12-31 ENCOUNTER — Encounter (HOSPITAL_COMMUNITY)
Admission: RE | Admit: 2015-12-31 | Discharge: 2015-12-31 | Disposition: A | Discharge status: Home / Self Care | Payer: BLUE CROSS/BLUE SHIELD | Source: Ambulatory Visit | Attending: Internal Medicine | Admitting: Internal Medicine

## 2015-12-31 DIAGNOSIS — Z955 Presence of coronary angioplasty implant and graft: Secondary | ICD-10-CM | POA: Diagnosis not present

## 2016-01-01 ENCOUNTER — Encounter: Payer: Self-pay | Admitting: Internal Medicine

## 2016-01-01 ENCOUNTER — Ambulatory Visit (INDEPENDENT_AMBULATORY_CARE_PROVIDER_SITE_OTHER): Payer: BLUE CROSS/BLUE SHIELD | Admitting: Internal Medicine

## 2016-01-01 VITALS — BP 122/72 | HR 61 | Ht 71.0 in | Wt 188.6 lb

## 2016-01-01 DIAGNOSIS — R5383 Other fatigue: Secondary | ICD-10-CM | POA: Diagnosis not present

## 2016-01-01 DIAGNOSIS — R946 Abnormal results of thyroid function studies: Secondary | ICD-10-CM

## 2016-01-01 DIAGNOSIS — I10 Essential (primary) hypertension: Secondary | ICD-10-CM | POA: Diagnosis not present

## 2016-01-01 DIAGNOSIS — E039 Hypothyroidism, unspecified: Secondary | ICD-10-CM | POA: Insufficient documentation

## 2016-01-01 DIAGNOSIS — I2510 Atherosclerotic heart disease of native coronary artery without angina pectoris: Secondary | ICD-10-CM

## 2016-01-01 DIAGNOSIS — Z950 Presence of cardiac pacemaker: Secondary | ICD-10-CM | POA: Insufficient documentation

## 2016-01-01 DIAGNOSIS — R7989 Other specified abnormal findings of blood chemistry: Secondary | ICD-10-CM

## 2016-01-01 DIAGNOSIS — Z8571 Personal history of Hodgkin lymphoma: Secondary | ICD-10-CM

## 2016-01-01 NOTE — Progress Notes (Signed)
OFFICE NOTE  Chief Complaint:  Follow-up complete heart block   Primary Care Physician: Nyoka Cowden, MD  HPI:  Tyler Hendrix is a pleasant 60 year old male who was previously seen by Dr. Acie Fredrickson in September of this past year. He is an add on to my schedule today for acute symptoms of palpitations. He reports over the past several days she's had skipped heartbeats and higher heart rates. He says his heart rate is typically in the 60s however recently has been in the 80s. He's also felt some skipped beats and they were happening 4-5 times a minute. He denies any chest pain or worsening shortness of breath. He's never had a stress test or any coronary disease evaluation. He does have a history of Hodgkin's lymphoma and had chest wall radiation. In September he had an echocardiogram which showed normal systolic function and it was felt that he was doing fairly well. He continued to do exercise which she has recently without any significant symptoms. 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   Tyler Hendrix returns today for follow-up. Overall he is feeling fairly well. He reports his palpitations have improved somewhat but he still feeling him. While wearing the monitor he was noted to have significant ventricular ectopy including isolated PVCs, and ventricular trigeminy and quadrigeminy. His nuclear stress test was negative for ischemia but was not gated. He did have an echo last fall which showed an EF of 55% which is reassuring.   At the pleasure see Tyler Hendrix back in the office today for follow-up. Overall he thinks he is doing much better on the high-dose of metoprolol 25 mg daily. Although he's tolerating this with very few palpitations, he still has some breakthrough in the afternoon. He generally takes the medicine in the morning. His EKG today however does show a low heart  rate of 48, with a short period to sinus rhythm and a competing junctional rhythm. I've had concerns about underlying conduction system disease based on his EKG showing a right bundle branch block and now there is some evidence of a junctional bradycardia. Despite this he has a good energy level, denies chest pain and is able to maintain normal physical activity. His wife recently had their new baby about 2 weeks ago.  06/22/2015  Tyler Hendrix returns today for follow-up. He reports that he is overall done fairly well. He denies any worsening shortness of breath or fatigue. Heart rate remains in the upper 40s although he has very little ventricular ectopy and his EKG today. He denies any significant palpitations. I think we found a balance currently between low-dose beta blocker and his underlying conduction disease. Should he have more symptoms however he may need antiarrhythmic medication. I do not see any features that are concerning for pacemaker at this point. Blood pressure, however remains elevated and not at goal.  10/04/2015  Tyler Hendrix was seen back today in follow-up. He reports a marked improvement in his PVCs however over the past week he's felt slightly more short of breath with exertion. He did not voluntarily mention this yet required significant persistent questioning on my part to get an answer out of him. He also said that he might have a little pressure in his chest when he tried to exercise. He denied any pain, dizziness, presyncope or syncopal symptoms. Blood pressure is improved somewhat  after the addition of amlodipine. He's no longer having any PVCs however his EKG is abnormal today showing a new complete heart block. Heart rate is 44 with a sinus rhythm and wide QRS. There are inferior T-wave abnormalities which were previously seen on his EKG.  10/05/2015  Tyler Hendrix was seen today in follow-up of complete heart block. This was noted yesterday during his office visit. Heart rate was  in the 40s and he was somewhat diaphoretic. He denied any chest pain or shortness of breath but has felt fatigue recently. He had taken Toprol XL the night before and I advised him to not take any last evening and allow the medicine to wash out of his system. A today he is a heart rate has improved up to 88 and is in sinus rhythm with first-degree AV block and bifascicular block. He thinks he actually feels a little better today although was not diaphoretic. He said he didn't sleep well last night and had some diarrhea this morning. He does not noted change in energy level although he feels that he's had some recurrent palpitations.  01/01/2016  Tyler Hendrix returns today for follow-up. He underwent placement of a pacemaker which was uneventful for complete heart block. He says since that time his color is improved energy is better. Heart rate today is 61 however the pacer is set at a backup rate of 50. He was concerned that this may be still causing a little fatigue but I reassured him that I think that his heart rate is over that number and unlikely to be the cause. Blood pressure is well-controlled. The pacemaker site looks appropriate without any hematoma or effusion. He was recently seen by Almyra Deforest, PA-C , and was noted to have an elevated TSH. This is been elevated in the past but was as high as 9. Free T4 was ordered and those labs are pending. He did have radiation to his thyroid as part of treatment for Hodgkin's lymphoma.   PMHx:  Past Medical History:  Diagnosis Date  . Abnormal TSH   . CAD in native artery    a. 11/2015: s/p 2 vessel PCI of the first diagonal branch of the LAD in the mid left circumflex with DES.  Marland Kitchen Complete heart block (HCC)    a. s/p Medtronic ppm 10/2015.  Marland Kitchen Hodgkin disease ([place])    a. s/p chest wall radiation.  . Pacemaker 10/2015   Medtronic  . PVC's (premature ventricular contractions)   . Right bundle branch block     Past Surgical History:  Procedure  Laterality Date  . CARDIAC CATHETERIZATION N/A 11/02/2015   Procedure: Left Heart Cath and Coronary Angiography;  Surgeon: Sherren Mocha, MD;  Location: [place] [facility];  Service: Cardiovascular;  Laterality: N/A;  . CARDIAC CATHETERIZATION N/A 11/21/2015   Procedure: Coronary Stent Intervention;  Surgeon: Sherren Mocha, MD;  Location: [place] [facility];  Service: Cardiovascular;  Laterality: N/A;  . EP IMPLANTABLE DEVICE N/A 11/02/2015   Procedure: Pacemaker Implant;  Surgeon: [name], MD;  Location: [place] [facility];  Service: Cardiovascular;  Laterality: N/A;  . KNEE SURGERY Right 1972  . LUMBAR LAMINECTOMY  1996    FAMHx:  Family History  Problem Relation Age of Onset  . Cancer      prostate father hx  . Heart disease Mother   . Heart disease Father   . Colon cancer Neg Hx     SOCHx:   reports that he has never  smoked. He has never used smokeless tobacco. He reports that he drinks about 3.0 oz of alcohol per week . He reports that he does not use drugs.  ALLERGIES:  No Known Allergies  ROS: Pertinent items noted in HPI and remainder of comprehensive ROS otherwise negative.  HOME MEDS: Current Outpatient Prescriptions  Medication Sig Dispense Refill  . amLODipine (NORVASC) 10 MG tablet Take 1 tablet (10 mg total) by mouth daily. 30 tablet 11  . aspirin EC 81 MG tablet Take 1 tablet (81 mg total) by mouth daily. 30 tablet 0  . atorvastatin (LIPITOR) 80 MG tablet Take 1 tablet (80 mg total) by mouth daily. 30 tablet 4  . clopidogrel (PLAVIX) 75 MG tablet Take 1 tablet (75 mg total) by mouth daily. 30 tablet 0  . Multiple Vitamin (MULTIVITAMIN) tablet Take 1 tablet by mouth daily.    . nitroGLYCERIN (NITROSTAT) 0.4 MG SL tablet Place 1 tablet (0.4 mg total) under the tongue every 5 (five) minutes as needed for chest pain (up to 3 doses). 25 tablet 3  . NON FORMULARY Take 4 tablets by mouth daily.     . traZODone (DESYREL) 50 MG tablet Take 25 mg by mouth at bedtime  as needed for sleep.      No current facility-administered medications for this visit.     LABS/IMAGING: No results found for this or any previous visit (from the past 48 hour(s)). No results found.  WEIGHTS: Wt Readings from Last 3 Encounters:  01/01/16 188 lb 9.6 oz (85.5 kg)  12/20/15 188 lb 7.9 oz (85.5 kg)  12/07/15 187 lb 9.6 oz (85.1 kg)    VITALS: BP 122/72   Pulse 61   Ht 5\' 11"  (1.803 m)   Wt 188 lb 9.6 oz (85.5 kg)   BMI 26.30 kg/m   EXAM: General appearance: alert and no distress Lungs: clear to auscultation bilaterally Heart: regular rate and rhythm, S1, S2 normal, no murmur, click, rub or gallop Extremities: extremities normal, atraumatic, no cyanosis or edema and Pacemaker site intact Skin: Skin color, texture, turgor normal. No rashes or lesions  EKG: Deferred  ASSESSMENT: 1. CAD - s/p PCI to the D1 and mid LCX with DES (11/2015) 2. Complete heart block-s/p Medtronic pacemaker (10/2015) 3. Palpitations - PVCs including trigeminy and quadrigeminy, generally improved on b-blocker 4. 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 History of chest wall radiation for Hodgkin's lymphoma 6. Essential hypertension  PLAN: 1.   Tyler Hendrix is feeling much better after revascularization and placement of a pacemaker. He is noted to have mildly elevated TSH. Free T4 is pending and I'll follow up on those values. He has had radiation that likely effect of the thyroid and could've developed hypothyroidism related to this. Follow-up with me in 3-6 months.  Pixie Casino, MD, [facility] Attending Cardiologist Hornell C Jenean Escandon 01/01/2016, 5:26 PM

## 2016-01-01 NOTE — Patient Instructions (Signed)
Medication Instructions:  Your physician recommends that you continue on your current medications as directed. Please refer to the Current Medication list given to you today.  Labwork: None   Testing/Procedures: None   Follow-Up: Your physician recommends that you schedule a follow-up appointment in: March 2018 with Dr Debara Pickett.  Any Other Special Instructions Will Be Listed Below (If Applicable).     If you need a refill on your cardiac medications before your next appointment, please call your pharmacy.

## 2016-01-02 ENCOUNTER — Encounter (HOSPITAL_COMMUNITY): Payer: BLUE CROSS/BLUE SHIELD

## 2016-01-02 LAB — COMPLETE METABOLIC PANEL WITH GFR
ALK PHOS: 77 U/L (ref 40–115)
ALT: 38 U/L (ref 9–46)
AST: 31 U/L (ref 10–35)
Albumin: 4.4 g/dL (ref 3.6–5.1)
BUN: 10 mg/dL (ref 7–25)
CALCIUM: 9.5 mg/dL (ref 8.6–10.3)
CHLORIDE: 105 mmol/L (ref 98–110)
CO2: 29 mmol/L (ref 20–31)
Creat: 1.04 mg/dL (ref 0.70–1.33)
GFR, EST NON AFRICAN AMERICAN: 78 mL/min (ref >=60)
Glucose, Bld: 99 mg/dL (ref 65–99)
POTASSIUM: 4.8 mmol/L (ref 3.5–5.3)
Sodium: 142 mmol/L (ref 135–146)
Total Bilirubin: 0.7 mg/dL (ref 0.2–1.2)
Total Protein: 6.8 g/dL (ref 6.1–8.1)

## 2016-01-02 LAB — LIPID PANEL
CHOLESTEROL: 100 mg/dL (ref <200)
HDL: 35 mg/dL — AB (ref >40)
LDL Cholesterol: 47 mg/dL (ref <100)
TRIGLYCERIDES: 90 mg/dL (ref <150)
Total CHOL/HDL Ratio: 2.9 Ratio (ref <5.0)
VLDL: 18 mg/dL (ref <30)

## 2016-01-02 LAB — T4, FREE: Free T4: 1 ng/dL (ref 0.8–1.8)

## 2016-01-02 LAB — TSH: TSH: 9.38 m[IU]/L — AB (ref 0.40–4.50)

## 2016-01-04 ENCOUNTER — Encounter (HOSPITAL_COMMUNITY): Payer: BLUE CROSS/BLUE SHIELD

## 2016-01-07 ENCOUNTER — Encounter (HOSPITAL_COMMUNITY): Payer: BLUE CROSS/BLUE SHIELD

## 2016-01-07 ENCOUNTER — Encounter (HOSPITAL_COMMUNITY)
Admission: RE | Admit: 2016-01-07 | Discharge: 2016-01-07 | Disposition: A | Discharge status: Home / Self Care | Payer: BLUE CROSS/BLUE SHIELD | Source: Ambulatory Visit | Attending: Internal Medicine | Admitting: Internal Medicine

## 2016-01-07 ENCOUNTER — Telehealth (HOSPITAL_COMMUNITY): Payer: Self-pay | Admitting: Internal Medicine

## 2016-01-09 ENCOUNTER — Encounter (HOSPITAL_COMMUNITY): Payer: BLUE CROSS/BLUE SHIELD

## 2016-01-11 ENCOUNTER — Encounter (HOSPITAL_COMMUNITY): Payer: BLUE CROSS/BLUE SHIELD

## 2016-01-11 ENCOUNTER — Telehealth: Payer: Self-pay | Admitting: Internal Medicine

## 2016-01-11 ENCOUNTER — Encounter (HOSPITAL_COMMUNITY): Payer: Self-pay | Admitting: *Deleted

## 2016-01-11 DIAGNOSIS — Z955 Presence of coronary angioplasty implant and graft: Secondary | ICD-10-CM

## 2016-01-11 MED ORDER — TESTOSTERONE 50 MG/5GM (1%) TD GEL: TRANSDERMAL | 0 refills | Status: DC

## 2016-01-11 NOTE — Telephone Encounter (Signed)
Pt's wife notified Rx was faxed to the pharmacy for pt.

## 2016-01-11 NOTE — Telephone Encounter (Signed)
Pt request refill  ANDROGEL 50 MG/5GM (1%) GEL   [facility]/ [address]

## 2016-01-11 NOTE — Progress Notes (Signed)
Discharge Summary  Patient Details  Name: Tyler Hendrix MRN: [id number] Date of Birth: [birth year] Referring Provider:   Flowsheet Row CARDIAC REHAB PHASE II ORIENTATION from 12/20/2015 in [facility]ARDIAC [facility]  Referring Provider  Hilty, [place] MD       Number of Visits: 4  Reason for Discharge:  Early Exit:  EXERCISING ON HIS OWN  Smoking History:  History  Smoking Status  . Never Smoker  Smokeless Tobacco  . Never Used    Diagnosis:  11/21/15 Status post coronary artery stent placement  ADL UCSD:   Initial Exercise Prescription:     Initial Exercise Prescription - 12/20/15 1500      Date of Initial Exercise RX and Referring Provider   Date 12/20/15   Referring Provider Hilty, [place] MD     Treadmill   MPH 3.2   Grade 2   Minutes 10   METs 4.33     Bike   Level 1.2   Minutes 10   METs 3.66     NuStep   Level 3   Minutes 10   METs 3     Prescription Details   Frequency (times per week) 3   Duration Progress to 45 minutes of aerobic exercise without signs/symptoms of physical distress     Intensity   THRR 40-80% of Max Heartrate 64-129   Ratings of Perceived Exertion 11-13   Perceived Dyspnea 0-4     Progression   Progression Continue to progress workloads to maintain intensity without signs/symptoms of physical distress.     Resistance Training   Training Prescription Yes   Weight 5   Reps 10-12      Discharge Exercise Prescription (Final Exercise Prescription Changes):     Exercise Prescription Changes - 01/10/16 1500      Response to Exercise   Blood Pressure (Admit) 124/86   Blood Pressure (Exercise) 140/82   Blood Pressure (Exit) 112/80   Heart Rate (Admit) 94 bpm   Heart Rate (Exercise) 122 bpm   Heart Rate (Exit) 94 bpm   Rating of Perceived Exertion (Exercise) 12   Duration Progress to 45 minutes of aerobic exercise without signs/symptoms of physical distress   Intensity THRR unchanged     Progression    Progression Continue to progress workloads to maintain intensity without signs/symptoms of physical distress.   Average METs 3.5     Resistance Training   Training Prescription Yes   Weight 5   Reps 10-12     Treadmill   MPH 3.2   Grade 2   Minutes 10   METs 4.33     Bike   Level 1.2   Minutes 10   METs 3.67     NuStep   Level 3   Minutes 10   METs 2.6      Functional Capacity:     6 Minute Walk    Row Name 12/20/15 1514         6 Minute Walk   Phase Initial     Distance 2084 feet     Walk Time 6 minutes     # of Rest Breaks 0     MPH 3.9     METS 4.6     RPE 12     VO2 Peak 16.07     Symptoms No     Resting HR 60 bpm     Resting BP 126/74     Max Ex. HR 110 bpm  Max Ex. BP 152/72     2 Minute Post BP 114/78        Psychological, QOL, Others - Outcomes: PHQ 2/9: Depression screen [facility] 2/9 12/26/2015 04/27/2014  Decreased Interest 0 0  Down, Depressed, Hopeless 0 0  PHQ - 2 Score 0 0    Quality of Life:     Quality of Life - 12/20/15 1549      Quality of Life Scores   Health/Function Pre 18.3 %   Socioeconomic Pre 21.43 %   Psych/Spiritual Pre 22.43 %   Family Pre 24.9 %   GLOBAL Pre 20.76 %      Personal Goals: Goals established at orientation with interventions provided to work toward goal.     Personal Goals and Risk Factors at Admission - 12/20/15 0825      Core Components/Risk Factors/Patient Goals on Admission   Increase Strength and Stamina Yes   Intervention Provide advice, education, support and counseling about physical activity/exercise needs.;Develop an individualized exercise prescription for aerobic and resistive training based on initial evaluation findings, risk stratification, comorbidities and participant's personal goals.   Expected Outcomes Achievement of increased cardiorespiratory fitness and enhanced flexibility, muscular endurance and strength shown through measurements of functional capacity and personal  statement of participant.   Hypertension Yes   Intervention Provide education on lifestyle modifcations including regular physical activity/exercise, weight management, moderate sodium restriction and increased consumption of fresh fruit, vegetables, and low fat dairy, alcohol moderation, and smoking cessation.;Monitor prescription use compliance.   Expected Outcomes Short Term: Continued assessment and intervention until BP is < 140/72mm HG in hypertensive participants. < 130/13mm HG in hypertensive participants with diabetes, heart failure or chronic kidney disease.;Long Term: Maintenance of blood pressure at goal levels.   Stress Yes   Intervention Offer individual and/or small group education and counseling on adjustment to heart disease, stress management and health-related lifestyle change. Teach and support self-help strategies.;Refer participants experiencing significant psychosocial distress to appropriate mental health specialists for further evaluation and treatment. When possible, include family members and significant others in education/counseling sessions.   Expected Outcomes Short Term: Participant demonstrates changes in health-related behavior, relaxation and other stress management skills, ability to obtain effective social support, and compliance with psychotropic medications if prescribed.;Long Term: Emotional wellbeing is indicated by absence of clinically significant psychosocial distress or social isolation.   Personal Goal Other Yes   Personal Goal improve cardiovascular fitness and increase strength   Intervention Individualized fitness program and educational classes (medication management, functional fitness, nutritional guidance, ect)   Expected Outcomes Improve overall fitness capacity        Personal Goals Discharge:   Nutrition & Weight - Outcomes:     Pre Biometrics - 12/20/15 1549      Pre Biometrics   Waist Circumference 41 inches   Hip Circumference 42.5  inches   Waist to Hip Ratio 0.96 %   Triceps Skinfold 10 mm   % Body Fat 25 %   Grip Strength 47 kg   Flexibility 0 in   Single Leg Stand 30 seconds         Post Biometrics - 01/10/16 1546       Post  Biometrics   Height 5\' 11"  (1.803 m)   Weight 187 lb 9.8 oz (85.1 kg)   BMI (Calculated) 26.2      Nutrition:     Nutrition Therapy & Goals - 01/07/16 1041      Nutrition Therapy   Diet Therapeutic Lifestyle Changes  Personal Nutrition Goals   Personal Goal #1 Maintain wt around 188 lb at graduation from Cardiac Rehab     Intervention Plan   Intervention Prescribe, educate and counsel regarding individualized specific dietary modifications aiming towards targeted core components such as weight, hypertension, lipid management, diabetes, heart failure and other comorbidities.   Expected Outcomes Short Term Goal: Understand basic principles of dietary content, such as calories, fat, sodium, cholesterol and nutrients.;Long Term Goal: Adherence to prescribed nutrition plan.      Nutrition Discharge:     Nutrition Assessments - 01/07/16 1050      MEDFICTS Scores   Pre Score 68      Education Questionnaire Score:     Knowledge Questionnaire Score - 12/20/15 1555      Knowledge Questionnaire Score   Pre Score 17/24      Mr Sciortino attended 4 exercise sessions and called to report he would continue exercising on his own. Mr Antuna's last day of exercise was on 12/31/2015.Gladstone Lighter, RN,BSN 01/11/2016 4:22 PM

## 2016-01-14 ENCOUNTER — Encounter (HOSPITAL_COMMUNITY): Payer: BLUE CROSS/BLUE SHIELD

## 2016-01-16 ENCOUNTER — Encounter (HOSPITAL_COMMUNITY): Payer: BLUE CROSS/BLUE SHIELD

## 2016-01-18 ENCOUNTER — Encounter (HOSPITAL_COMMUNITY): Payer: BLUE CROSS/BLUE SHIELD

## 2016-01-21 ENCOUNTER — Encounter (HOSPITAL_COMMUNITY): Payer: BLUE CROSS/BLUE SHIELD

## 2016-01-23 ENCOUNTER — Encounter (HOSPITAL_COMMUNITY): Payer: BLUE CROSS/BLUE SHIELD

## 2016-01-25 ENCOUNTER — Encounter (HOSPITAL_COMMUNITY): Payer: BLUE CROSS/BLUE SHIELD

## 2016-01-28 ENCOUNTER — Encounter (HOSPITAL_COMMUNITY): Payer: BLUE CROSS/BLUE SHIELD

## 2016-01-28 ENCOUNTER — Telehealth: Payer: Self-pay | Admitting: Internal Medicine

## 2016-01-28 DIAGNOSIS — E039 Hypothyroidism, unspecified: Secondary | ICD-10-CM

## 2016-01-28 NOTE — Telephone Encounter (Signed)
Pt state that he went to his cardiologist and they said that he has hypothyroidism and he would like to have a referral to [facility] before the end of the year if possible.

## 2016-01-29 NOTE — Telephone Encounter (Signed)
Okay for endocrine referral 

## 2016-01-29 NOTE — Telephone Encounter (Signed)
Okay for referral to Endo?

## 2016-01-29 NOTE — Telephone Encounter (Signed)
Endocrine referral made for pt.

## 2016-01-30 ENCOUNTER — Encounter (HOSPITAL_COMMUNITY): Payer: BLUE CROSS/BLUE SHIELD

## 2016-02-01 ENCOUNTER — Encounter (HOSPITAL_COMMUNITY): Payer: BLUE CROSS/BLUE SHIELD

## 2016-02-04 ENCOUNTER — Encounter (HOSPITAL_COMMUNITY): Payer: BLUE CROSS/BLUE SHIELD

## 2016-02-06 ENCOUNTER — Encounter (HOSPITAL_COMMUNITY): Payer: BLUE CROSS/BLUE SHIELD

## 2016-02-06 ENCOUNTER — Encounter: Payer: Self-pay | Admitting: Internal Medicine

## 2016-02-06 ENCOUNTER — Ambulatory Visit (INDEPENDENT_AMBULATORY_CARE_PROVIDER_SITE_OTHER): Payer: BLUE CROSS/BLUE SHIELD | Admitting: Internal Medicine

## 2016-02-06 VITALS — BP 132/76 | HR 52 | Ht 70.0 in | Wt 190.0 lb

## 2016-02-06 DIAGNOSIS — I442 Atrioventricular block, complete: Secondary | ICD-10-CM | POA: Diagnosis not present

## 2016-02-06 DIAGNOSIS — I2510 Atherosclerotic heart disease of native coronary artery without angina pectoris: Secondary | ICD-10-CM | POA: Diagnosis not present

## 2016-02-06 LAB — CUP PACEART INCLINIC DEVICE CHECK
Battery Voltage: 3.05 V
Brady Statistic AS VS Percent: 0.07 %
Implantable Lead Implant Date: 20170922
Implantable Lead Location: 753859
Implantable Lead Model: 5076
Implantable Pulse Generator Implant Date: 20170922
Lead Channel Impedance Value: 342 Ohm
Lead Channel Impedance Value: 399 Ohm
Lead Channel Pacing Threshold Amplitude: 0.5 V
Lead Channel Pacing Threshold Amplitude: 0.75 V
Lead Channel Pacing Threshold Pulse Width: 0.4 ms
Lead Channel Sensing Intrinsic Amplitude: 3.1 mV
Lead Channel Setting Pacing Amplitude: 2.5 V
Lead Channel Setting Pacing Pulse Width: 0.4 ms
MDC IDC LEAD IMPLANT DT: 20170922
MDC IDC LEAD LOCATION: 753860
MDC IDC MSMT BATTERY REMAINING LONGEVITY: 127 mo
MDC IDC MSMT LEADCHNL RA PACING THRESHOLD PULSEWIDTH: 0.4 ms
MDC IDC MSMT LEADCHNL RV IMPEDANCE VALUE: 437 Ohm
MDC IDC MSMT LEADCHNL RV IMPEDANCE VALUE: 608 Ohm
MDC IDC MSMT LEADCHNL RV SENSING INTR AMPL: 8.9 mV
MDC IDC SESS DTM: 20171227134244
MDC IDC SET LEADCHNL RA PACING AMPLITUDE: 2 V
MDC IDC SET LEADCHNL RV SENSING SENSITIVITY: 2 mV
MDC IDC STAT BRADY AP VP PERCENT: 27.98 %
MDC IDC STAT BRADY AP VS PERCENT: 0.01 %
MDC IDC STAT BRADY AS VP PERCENT: 71.94 %
MDC IDC STAT BRADY RA PERCENT PACED: 27.98 %
MDC IDC STAT BRADY RV PERCENT PACED: 99.91 %

## 2016-02-06 NOTE — Progress Notes (Signed)
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  Since his pacemaker implant, the patient reports doing very well.  Today, he denies symptoms of palpitations, chest pain, shortness of breath,  lower extremity edema, dizziness, presyncope, or syncope.  The patient is otherwise without complaint today.   Past Medical History:  Diagnosis Date  . Abnormal TSH   . CAD in native artery    a. 11/2015: s/p 2 vessel PCI of the first diagonal branch of the LAD in the mid left circumflex with DES.  Marland Kitchen Complete heart block (HCC)    a. s/p Medtronic ppm 10/2015.  Marland Kitchen Hodgkin disease ([place])    a. s/p chest wall radiation.  . Pacemaker 10/2015   Medtronic  . PVC's (premature ventricular contractions)   . Right bundle branch block    Past Surgical History:  Procedure Laterality Date  . CARDIAC CATHETERIZATION N/A 11/02/2015   Procedure: Left Heart Cath and Coronary Angiography;  Surgeon: Sherren Mocha, MD;  Location: [place] [facility];  Service: Cardiovascular;  Laterality: N/A;  . CARDIAC CATHETERIZATION N/A 11/21/2015   Procedure: Coronary Stent Intervention;  Surgeon: Sherren Mocha, MD;  Location: [place] [facility];  Service: Cardiovascular;  Laterality: N/A;  . EP IMPLANTABLE DEVICE N/A 11/02/2015   MDT Adivisa MRI conditional dual chamber pacemaker implanted by Dr Rayann Heman for complete heart block  . KNEE SURGERY Right 1972  . LUMBAR LAMINECTOMY  1996    ROS- all systems are reviewed and negative except as per HPI above  Current Outpatient Prescriptions  Medication Sig Dispense Refill  . amLODipine (NORVASC) 10 MG tablet Take 1 tablet (10 mg total) by mouth daily. 30 tablet 11  . aspirin EC 81 MG tablet Take 1 tablet (81 mg total) by mouth daily. 30 tablet 0  . atorvastatin (LIPITOR) 80 MG tablet Take 1 tablet (80 mg total) by mouth daily. 30 tablet 4  . clopidogrel (PLAVIX) 75 MG  tablet Take 1 tablet (75 mg total) by mouth daily. 30 tablet 0  . Multiple Vitamin (MULTIVITAMIN) tablet Take 1 tablet by mouth daily.    . nitroGLYCERIN (NITROSTAT) 0.4 MG SL tablet Place 1 tablet (0.4 mg total) under the tongue every 5 (five) minutes as needed for chest pain (up to 3 doses). 25 tablet 3  . NON FORMULARY Take 4 tablets by mouth daily.     Marland Kitchen testosterone (ANDROGEL) 50 MG/5GM (1%) GEL APPLY 1 PACKET ONCE DAILY AS DIRECTED 150 g 0  . traZODone (DESYREL) 50 MG tablet Take 25 mg by mouth at bedtime as needed for sleep.      No current facility-administered medications for this visit.     Physical Exam: Vitals:   02/06/16 1023  BP: 132/76  Pulse: (!) 52  SpO2: 98%  Weight: 190 lb (86.2 kg)  Height: 5\' 10"  (1.778 m)    GEN- The patient is well appearing, alert and oriented x 3 today.   Head- normocephalic, atraumatic Eyes-  Sclera clear, conjunctiva pink Ears- hearing intact Oropharynx- clear Lungs- Clear to ausculation bilaterally, normal work of breathing Chest- pacemaker pocket is well healed Heart- Regular rate and rhythm, no murmurs, rubs or gallops, PMI not laterally displaced GI- soft, NT, ND, + BS Extremities- no clubbing, cyanosis, or edema  Pacemaker interrogation- reviewed in detail today,  See PACEART report ekg today reveals sinus rhythm with V pacing  Assessment and Plan:  1. Complete heart block Normal pacemaker function See Pace Art report No changes today  2. CAD Doing well s/p PCI  carelink Return to see EP NP in 1year Follow-up with Dr Debara Pickett as scheduled  [name] MD, [facility] 02/06/2016 11:16 AM

## 2016-02-06 NOTE — Patient Instructions (Addendum)
Medication Instructions:  Your physician recommends that you continue on your current medications as directed. Please refer to the Current Medication list given to you today.    Labwork: None ordered   Testing/Procedures: None ordered   Follow-Up: Remote monitoring is used to monitor your Pacemaker  from home. This monitoring reduces the number of office visits required to check your device to one time per year. It allows [place] to keep an eye on the functioning of your device to ensure it is working properly. You are scheduled for a device check from home on 05/07/16. You may send your transmission at any time that day. If you have a wireless device, the transmission will be sent automatically. After your physician reviews your transmission, you will receive a postcard with your next transmission date.  Your physician wants you to follow-up in: 12 months with Chanetta Marshall, NP You will receive a reminder letter in the mail two months in advance. If you don't receive a letter, please call our office to schedule the follow-up appointment.

## 2016-02-08 ENCOUNTER — Encounter (HOSPITAL_COMMUNITY): Payer: BLUE CROSS/BLUE SHIELD

## 2016-02-11 ENCOUNTER — Encounter (HOSPITAL_COMMUNITY): Payer: BLUE CROSS/BLUE SHIELD

## 2016-02-13 ENCOUNTER — Encounter (HOSPITAL_COMMUNITY): Payer: BLUE CROSS/BLUE SHIELD

## 2016-02-15 ENCOUNTER — Encounter (HOSPITAL_COMMUNITY): Payer: BLUE CROSS/BLUE SHIELD

## 2016-02-18 ENCOUNTER — Encounter (HOSPITAL_COMMUNITY): Payer: BLUE CROSS/BLUE SHIELD

## 2016-02-20 ENCOUNTER — Encounter (HOSPITAL_COMMUNITY): Payer: BLUE CROSS/BLUE SHIELD

## 2016-02-22 ENCOUNTER — Encounter (HOSPITAL_COMMUNITY): Payer: BLUE CROSS/BLUE SHIELD

## 2016-02-25 ENCOUNTER — Encounter (HOSPITAL_COMMUNITY): Payer: BLUE CROSS/BLUE SHIELD

## 2016-02-27 ENCOUNTER — Encounter (HOSPITAL_COMMUNITY): Payer: BLUE CROSS/BLUE SHIELD

## 2016-02-29 ENCOUNTER — Encounter (HOSPITAL_COMMUNITY): Payer: BLUE CROSS/BLUE SHIELD

## 2016-03-03 ENCOUNTER — Ambulatory Visit (INDEPENDENT_AMBULATORY_CARE_PROVIDER_SITE_OTHER): Payer: BLUE CROSS/BLUE SHIELD | Admitting: Internal Medicine

## 2016-03-03 ENCOUNTER — Encounter: Payer: Self-pay | Admitting: Internal Medicine

## 2016-03-03 ENCOUNTER — Encounter (HOSPITAL_COMMUNITY): Payer: BLUE CROSS/BLUE SHIELD

## 2016-03-03 VITALS — BP 134/80 | HR 61 | Ht 72.0 in | Wt 190.0 lb

## 2016-03-03 DIAGNOSIS — E039 Hypothyroidism, unspecified: Secondary | ICD-10-CM

## 2016-03-03 DIAGNOSIS — E038 Other specified hypothyroidism: Secondary | ICD-10-CM

## 2016-03-03 MED ORDER — LEVOTHYROXINE SODIUM 50 MCG PO TABS: 50.0000 ug | ORAL_TABLET | Freq: Every day | ORAL | 1 refills | Status: DC

## 2016-03-03 NOTE — Progress Notes (Signed)
Patient ID: Tyler Hendrix, male   DOB: Oct 15, 1955, 61 y.o.   MRN: [id number]    HPI  Tyler Hendrix is a 61 y.o.-year-old male, referred by his PCP, Dr.Kwiatkowski, for management of subclinical hypothyroidism.  Pt. has been dx with hypothyroidism in ~2010. He has been found to have a high TSH during investigation for AVB. Not on LT4 yet.  He had RxTx and ChTx in 2002 for Hodgkin Lymphoma.  I reviewed pt's thyroid tests: Lab Results  Component Value Date   TSH 9.38 (H) 01/01/2016   TSH 9.07 (H) 10/29/2015   TSH 4.76 (H) 04/20/2014   TSH 4.82 10/17/2011   TSH 5.14 05/20/2010   TSH 9.351 (H) 03/16/2009   TSH 5.064 (H) 03/17/2008   TSH 4.861 03/12/2007   TSH 4.984 02/13/2006   FREET4 1.0 01/01/2016    Pt describes: - no weight gain - + fatigue - + slight cold intolerance - no depression - no constipation - + dry skin in winter - no hair loss  Pt denies feeling nodules in neck, hoarseness, dysphagia/odynophagia, SOB with lying down.  She has no FH of thyroid disorders. No FH of thyroid cancer.  + h/o radiation tx to head or neck  - 2002. No recent use of iodine supplements. No steroid use.  Pt. 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  ROS: Constitutional: + See history of present illness, + nocturia Eyes: + blurry vision, no xerophthalmia ENT: no sore throat, no nodules palpated in throat, no dysphagia/odynophagia, no hoarseness Cardiovascular: no CP/+ SOB/+ palpitations/leg swelling Respiratory: + cough/+ SOB/+ wheezing Gastrointestinal: no N/V/D/C Musculoskeletal: no muscle/joint aches Skin: no rashes Neurological: no tremors/numbness/tingling/dizziness Psychiatric: no depression/anxiety  Past Medical History:  Diagnosis Date  . Abnormal TSH   . CAD in native artery    a. 11/2015: s/p 2 vessel PCI of the first diagonal branch of the LAD in the mid left circumflex with DES.  Marland Kitchen Complete heart block (HCC)    a. s/p Medtronic  ppm 10/2015.  Marland Kitchen Hodgkin disease (HCC)    a. s/p chest wall radiation.  . Pacemaker 10/2015   Medtronic  . PVC's (premature ventricular contractions)   . Right bundle branch block    Past Surgical History:  Procedure Laterality Date  . CARDIAC CATHETERIZATION N/A 11/02/2015   Procedure: Left Heart Cath and Coronary Angiography;  Surgeon: Tonny Bollman, MD;  Location: [facility] INVASIVE CV LAB;  Service: Cardiovascular;  Laterality: N/A;  . CARDIAC CATHETERIZATION N/A 11/21/2015   Procedure: Coronary Stent Intervention;  Surgeon: Tonny Bollman, MD;  Location: [facility] INVASIVE CV LAB;  Service: Cardiovascular;  Laterality: N/A;  . EP IMPLANTABLE DEVICE N/A 11/02/2015   MDT Adivisa MRI conditional dual chamber pacemaker implanted by Dr Johney Frame for complete heart block  . KNEE SURGERY Right 1972  . LUMBAR LAMINECTOMY  1996   Social History   Social History  . Marital status: Married    Spouse name: N/A  . Number of children: 2  . Years of education: Masters   Occupational History  . lawyer     [employer]   Social History Main Topics  . Smoking status: Never Smoker  . Smokeless tobacco: Never Used  . Alcohol use 3.0 oz/week    5 Glasses of wine per week  . Drug use: No  . Sexual activity: Not on file   Other Topics Concern  . 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 amLODipine (NORVASC) 10 MG tablet Take 1 tablet (10 mg total) by mouth daily. 30 tablet 11  . aspirin EC 81 MG tablet Take 1 tablet (81 mg total) by mouth daily. 30 tablet 0  . atorvastatin (LIPITOR) 80 MG tablet Take 1 tablet (80 mg total) by mouth daily. 30 tablet 4  . clopidogrel (PLAVIX) 75 MG tablet Take 1 tablet (75 mg total) by mouth daily. 30 tablet 0  . Multiple Vitamin (MULTIVITAMIN) tablet Take 1 tablet by mouth daily.    . NON FORMULARY Take 4 tablets by mouth  daily.     Marland Kitchen testosterone (ANDROGEL) 50 MG/5GM (1%) GEL APPLY 1 PACKET ONCE DAILY AS DIRECTED 150 g 0  . traZODone (DESYREL) 50 MG tablet Take 25 mg by mouth at bedtime as needed for sleep.     . nitroGLYCERIN (NITROSTAT) 0.4 MG SL tablet Place 1 tablet (0.4 mg total) under the tongue every 5 (five) minutes as needed for chest pain (up to 3 doses). (Patient not taking: Reported on 03/03/2016) 25 tablet 3   No current facility-administered medications on file prior to visit.    No Known Allergies Family History  Problem Relation Age of Onset  . Cancer      prostate father hx  . Heart disease Mother   . Heart disease Father   . Colon cancer Neg Hx     PE: BP 134/80 (BP Location: Left Arm, Patient Position: Sitting)   Pulse 61   Ht 6' (1.829 m)   Wt 190 lb (86.2 kg)   SpO2 98%   BMI 25.77 kg/m  Wt Readings from Last 3 Encounters:  03/03/16 190 lb (86.2 kg)  02/06/16 190 lb (86.2 kg)  01/01/16 188 lb 9.6 oz (85.5 kg)   Constitutional: Normal weight, in NAD Eyes: PERRLA, EOMI, no exophthalmos ENT: moist mucous membranes, no thyromegaly, no cervical lymphadenopathy Cardiovascular: RRR, No MRG Respiratory: CTA B Gastrointestinal: abdomen soft, NT, ND, BS+ Musculoskeletal: no deformities, strength intact in all 4 Skin: moist, warm, no rashes Neurological: Very slight tremor with outstretched hands, DTR normal in all 4  ASSESSMENT: 1. Subclinical Hypothyroidism - He does not appear to have a goiter, thyroid nodules, or neck compression symptoms  PLAN:  1. Patient with long-standing subclinical hypothyroidism, not on levothyroxine therapy. - We discussed about the physiology of the pituitary-thyroid axis and explained the meaning of the thyroid tests - he has had fluctuating TSH levels for quite a few years, with normal free thyroid hormones. He is lately feeling a little more fatigued and cold and his TSH has increased to 9. At this point, I would suggest starting treatment with  levothyroxine 50 g daily and recheck his TFTs in 6 weeks. - We discussed about correct intake of levothyroxine, fasting, with water, separated by at least 30 minutes from breakfast, and separated by more than 4 hours from calcium, iron, multivitamins, acid reflux medications (PPIs). - If labs today are abnormal, he will need to return in ~6 weeks for repeat labs - given more material to read about hypothyroidism - Otherwise, I will see him back in 4 months  Carlus Pavlov, MD PhD [facility]

## 2016-03-03 NOTE — Patient Instructions (Addendum)
Please start Levothyroxine 50 mcg daily.  Take the thyroid hormone every day, with water, at least 30 minutes before breakfast, separated by at least 4 hours from: - acid reflux medications - calcium - iron - multivitamins  Please come back for a follow-up appointment in 4 months and for abs in 6 weeks.   Hypothyroidism Hypothyroidism is a disorder of the thyroid. The thyroid is a large gland that is located in the lower front of the neck. The thyroid releases hormones that control how the body works. With hypothyroidism, the thyroid does not make enough of these hormones. What are the causes? Causes of hypothyroidism may include:  Viral infections.  Pregnancy.  Your own defense system (immune system) attacking your thyroid.  Certain medicines.  Birth defects.  Past radiation treatments to your head or neck.  Past treatment with radioactive iodine.  Past surgical removal of part or all of your thyroid.  Problems with the gland that is located in the center of your brain (pituitary). What are the signs or symptoms? Signs and symptoms of hypothyroidism may include:  Feeling as though you have no energy (lethargy).  Inability to tolerate cold.  Weight gain that is not explained by a change in diet or exercise habits.  Dry skin.  Coarse hair.  Menstrual irregularity.  Slowing of thought processes.  Constipation.  Sadness or depression. How is this diagnosed? Your health care provider may diagnose hypothyroidism with blood tests and ultrasound tests. How is this treated? Hypothyroidism is treated with medicine that replaces the hormones that your body does not make. After you begin treatment, it may take several weeks for symptoms to go away. Follow these instructions at home:  Take medicines only as directed by your health care provider.  If you start taking any new medicines, tell your health care provider.  Keep all follow-up visits as directed by your  health care provider. This is important. As your condition improves, your dosage needs may change. You will need to have blood tests regularly so that your health care provider can watch your condition. Contact a health care provider if:  Your symptoms do not get better with treatment.  You are taking thyroid replacement medicine and:  You sweat excessively.  You have tremors.  You feel anxious.  You lose weight rapidly.  You cannot tolerate heat.  You have emotional swings.  You have diarrhea.  You feel weak. Get help right away if:  You develop chest pain.  You develop an irregular heartbeat.  You develop a rapid heartbeat. This information is not intended to replace advice given to you by your health care provider. Make sure you discuss any questions you have with your health care provider. Document Released: 01/27/2005 Document Revised: 07/05/2015 Document Reviewed: 06/14/2013 Elsevier Interactive Patient Education  2017 [employer].

## 2016-03-05 ENCOUNTER — Encounter (HOSPITAL_COMMUNITY): Payer: BLUE CROSS/BLUE SHIELD

## 2016-03-07 ENCOUNTER — Encounter (HOSPITAL_COMMUNITY): Payer: BLUE CROSS/BLUE SHIELD

## 2016-03-10 ENCOUNTER — Encounter (HOSPITAL_COMMUNITY): Payer: BLUE CROSS/BLUE SHIELD

## 2016-03-12 ENCOUNTER — Encounter (HOSPITAL_COMMUNITY): Payer: BLUE CROSS/BLUE SHIELD

## 2016-03-14 ENCOUNTER — Encounter (HOSPITAL_COMMUNITY): Payer: BLUE CROSS/BLUE SHIELD

## 2016-03-17 ENCOUNTER — Encounter (HOSPITAL_COMMUNITY): Payer: BLUE CROSS/BLUE SHIELD

## 2016-03-19 ENCOUNTER — Encounter (HOSPITAL_COMMUNITY): Payer: BLUE CROSS/BLUE SHIELD

## 2016-03-21 ENCOUNTER — Encounter (HOSPITAL_COMMUNITY): Payer: BLUE CROSS/BLUE SHIELD

## 2016-03-21 ENCOUNTER — Other Ambulatory Visit: Payer: Self-pay | Admitting: Internal Medicine

## 2016-03-21 ENCOUNTER — Encounter (HOSPITAL_COMMUNITY): Payer: Self-pay | Admitting: *Deleted

## 2016-03-24 ENCOUNTER — Encounter (HOSPITAL_COMMUNITY): Payer: BLUE CROSS/BLUE SHIELD

## 2016-03-26 ENCOUNTER — Encounter (HOSPITAL_COMMUNITY): Payer: BLUE CROSS/BLUE SHIELD

## 2016-03-28 ENCOUNTER — Encounter (HOSPITAL_COMMUNITY): Payer: BLUE CROSS/BLUE SHIELD

## 2016-03-29 ENCOUNTER — Other Ambulatory Visit: Payer: Self-pay | Admitting: Internal Medicine

## 2016-03-31 ENCOUNTER — Encounter (HOSPITAL_COMMUNITY): Payer: BLUE CROSS/BLUE SHIELD

## 2016-04-01 NOTE — Telephone Encounter (Signed)
Rx(s) sent to pharmacy electronically.  

## 2016-04-02 ENCOUNTER — Encounter (HOSPITAL_COMMUNITY): Payer: BLUE CROSS/BLUE SHIELD

## 2016-04-04 ENCOUNTER — Encounter (HOSPITAL_COMMUNITY): Payer: BLUE CROSS/BLUE SHIELD

## 2016-04-07 ENCOUNTER — Encounter (HOSPITAL_COMMUNITY): Payer: BLUE CROSS/BLUE SHIELD

## 2016-04-09 ENCOUNTER — Encounter (HOSPITAL_COMMUNITY): Payer: BLUE CROSS/BLUE SHIELD

## 2016-04-11 ENCOUNTER — Encounter (HOSPITAL_COMMUNITY): Payer: BLUE CROSS/BLUE SHIELD

## 2016-04-14 ENCOUNTER — Encounter (HOSPITAL_COMMUNITY): Payer: BLUE CROSS/BLUE SHIELD

## 2016-04-16 ENCOUNTER — Other Ambulatory Visit (INDEPENDENT_AMBULATORY_CARE_PROVIDER_SITE_OTHER): Payer: BLUE CROSS/BLUE SHIELD

## 2016-04-16 ENCOUNTER — Encounter (HOSPITAL_COMMUNITY): Payer: BLUE CROSS/BLUE SHIELD

## 2016-04-16 DIAGNOSIS — E038 Other specified hypothyroidism: Secondary | ICD-10-CM

## 2016-04-16 DIAGNOSIS — E039 Hypothyroidism, unspecified: Secondary | ICD-10-CM

## 2016-04-16 LAB — TSH: TSH: 5.55 u[IU]/mL — ABNORMAL HIGH (ref 0.35–4.50)

## 2016-04-16 LAB — T4, FREE: Free T4: 0.87 ng/dL (ref 0.60–1.60)

## 2016-04-17 ENCOUNTER — Encounter: Payer: Self-pay | Admitting: Internal Medicine

## 2016-04-17 ENCOUNTER — Ambulatory Visit (INDEPENDENT_AMBULATORY_CARE_PROVIDER_SITE_OTHER): Payer: BLUE CROSS/BLUE SHIELD | Admitting: Internal Medicine

## 2016-04-17 VITALS — BP 104/48 | HR 57 | Ht 71.0 in | Wt 190.4 lb

## 2016-04-17 DIAGNOSIS — Z950 Presence of cardiac pacemaker: Secondary | ICD-10-CM | POA: Diagnosis not present

## 2016-04-17 DIAGNOSIS — E785 Hyperlipidemia, unspecified: Secondary | ICD-10-CM

## 2016-04-17 DIAGNOSIS — I2510 Atherosclerotic heart disease of native coronary artery without angina pectoris: Secondary | ICD-10-CM | POA: Diagnosis not present

## 2016-04-17 DIAGNOSIS — I4510 Unspecified right bundle-branch block: Secondary | ICD-10-CM | POA: Diagnosis not present

## 2016-04-17 DIAGNOSIS — I10 Essential (primary) hypertension: Secondary | ICD-10-CM

## 2016-04-17 MED ORDER — VALSARTAN 80 MG PO TABS: 80.0000 mg | ORAL_TABLET | Freq: Every day | ORAL | 3 refills | Status: DC

## 2016-04-17 NOTE — Patient Instructions (Addendum)
Your physician has recommended you make the following change in your medication:  -- STOP amlodipine -- START valsartan 80mg  once daily   Your physician wants you to follow-up in: [place] with Dr. Debara Pickett. You will receive a reminder letter in the mail two months in advance. If you don't receive a letter, please call our office to schedule the follow-up appointment.

## 2016-04-17 NOTE — Progress Notes (Signed)
OFFICE NOTE  Chief Complaint:  No complaints  Primary Care Physician: Nyoka Cowden, MD  HPI:  Tyler Hendrix is a pleasant 61 year old male who was previously seen by Dr. Acie Fredrickson in September of this past year. He is an add on to my schedule today for acute symptoms of palpitations. He reports over the past several days she's had skipped heartbeats and higher heart rates. He says his heart rate is typically in the 60s however recently has been in the 80s. He's also felt some skipped beats and they were happening 4-5 times a minute. He denies any chest pain or worsening shortness of breath. He's never had a stress test or any coronary disease evaluation. He does have a history of Hodgkin's lymphoma and had chest wall radiation. In September he had an echocardiogram which showed normal systolic function and it was felt that he was doing fairly well. He continued to do exercise which she has recently without any significant symptoms. 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   Tyler Hendrix returns today for follow-up. Overall he is feeling fairly well. He reports his palpitations have improved somewhat but he still feeling him. While wearing the monitor he was noted to have significant ventricular ectopy including isolated PVCs, and ventricular trigeminy and quadrigeminy. His nuclear stress test was negative for ischemia but was not gated. He did have an echo last fall which showed an EF of 55% which is reassuring.   At the pleasure see Tyler Hendrix back in the office today for follow-up. Overall he thinks he is doing much better on the high-dose of metoprolol 25 mg daily. Although he's tolerating this with very few palpitations, he still has some breakthrough in the afternoon. He generally takes the medicine in the morning. His EKG today however does show a low heart rate of 48, with a  short period to sinus rhythm and a competing junctional rhythm. I've had concerns about underlying conduction system disease based on his EKG showing a right bundle branch block and now there is some evidence of a junctional bradycardia. Despite this he has a good energy level, denies chest pain and is able to maintain normal physical activity. His wife recently had their new baby about 2 weeks ago.  06/22/2015  Tyler Hendrix returns today for follow-up. He reports that he is overall done fairly well. He denies any worsening shortness of breath or fatigue. Heart rate remains in the upper 40s although he has very little ventricular ectopy and his EKG today. He denies any significant palpitations. I think we found a balance currently between low-dose beta blocker and his underlying conduction disease. Should he have more symptoms however he may need antiarrhythmic medication. I do not see any features that are concerning for pacemaker at this point. Blood pressure, however remains elevated and not at goal.  10/04/2015  Tyler Hendrix was seen back today in follow-up. He reports a marked improvement in his PVCs however over the past week he's felt slightly more short of breath with exertion. He did not voluntarily mention this yet required significant persistent questioning on my part to get an answer out of him. He also said that he might have a little pressure in his chest when he tried to exercise. He denied any pain, dizziness, presyncope or syncopal symptoms. Blood pressure is improved somewhat after the addition  of amlodipine. He's no longer having any PVCs however his EKG is abnormal today showing a new complete heart block. Heart rate is 44 with a sinus rhythm and wide QRS. There are inferior T-wave abnormalities which were previously seen on his EKG.  10/05/2015  Tyler Hendrix was seen today in follow-up of complete heart block. This was noted yesterday during his office visit. Heart rate was in the 40s and he  was somewhat diaphoretic. He denied any chest pain or shortness of breath but has felt fatigue recently. He had taken Toprol XL the night before and I advised him to not take any last evening and allow the medicine to wash out of his system. A today he is a heart rate has improved up to 88 and is in sinus rhythm with first-degree AV block and bifascicular block. He thinks he actually feels a little better today although was not diaphoretic. He said he didn't sleep well last night and had some diarrhea this morning. He does not noted change in energy level although he feels that he's had some recurrent palpitations.  01/01/2016  Tyler Hendrix returns today for follow-up. He underwent placement of a pacemaker which was uneventful for complete heart block. He says since that time his color is improved energy is better. Heart rate today is 61 however the pacer is set at a backup rate of 50. He was concerned that this may be still causing a little fatigue but I reassured him that I think that his heart rate is over that number and unlikely to be the cause. Blood pressure is well-controlled. The pacemaker site looks appropriate without any hematoma or effusion. He was recently seen by Almyra Deforest, PA-C , and was noted to have an elevated TSH. This is been elevated in the past but was as high as 9. Free T4 was ordered and those labs are pending. He did have radiation to his thyroid as part of treatment for Hodgkin's lymphoma.  04/18/2015  Tyler Hendrix was seen today in follow-up. Overall he seems to be doing very well. After discharge his amlodipine was increased to 10 mg from 5 mg. I rechecked his blood pressure today was 104/48, and he has noted that occasionally he gets a flushed feeling when exercising. I suspect he may be on too much medicine. We did discuss that amlodipine although does have a benefit for controlling blood pressure, has no intrinsic cardiovascular benefit. He does have normal renal function would  probably benefit from being on an ACE or ARB. He denies any chest pain or worsening shortness of breath. No problems with his pacemaker. He has been seeing an endocrinologist and his thyroid function has normalized on low-dose levothyroxine.  PMHx:  Past Medical History:  Diagnosis Date  . Abnormal TSH   . CAD in native artery    a. 11/2015: s/p 2 vessel PCI of the first diagonal branch of the LAD in the mid left circumflex with DES.  Marland Kitchen Complete heart block (HCC)    a. s/p Medtronic ppm 10/2015.  Marland Kitchen Hodgkin disease ([place])    a. s/p chest wall radiation.  . Pacemaker 10/2015   Medtronic  . PVC's (premature ventricular contractions)   . Right bundle branch block     Past Surgical History:  Procedure Laterality Date  . CARDIAC CATHETERIZATION N/A 11/02/2015   Procedure: Left Heart Cath and Coronary Angiography;  Surgeon: Sherren Mocha, MD;  Location: [facility] CV LAB;  Service: Cardiovascular;  Laterality: N/A;  . CARDIAC  CATHETERIZATION N/A 11/21/2015   Procedure: Coronary Stent Intervention;  Surgeon: Sherren Mocha, MD;  Location: [place] CV LAB;  Service: Cardiovascular;  Laterality: N/A;  . EP IMPLANTABLE DEVICE N/A 11/02/2015   MDT Adivisa MRI conditional dual chamber pacemaker implanted by Dr Rayann Heman for complete heart block  . KNEE SURGERY Right 1972  . LUMBAR LAMINECTOMY  1996    FAMHx:  Family History  Problem Relation Age of Onset  . Cancer      prostate father hx  . Heart disease Mother   . Heart disease Father   . Colon cancer Neg Hx     SOCHx:   reports that he has never smoked. He has never used smokeless tobacco. He reports that he drinks about 3.0 oz of alcohol per week . He reports that he does not use drugs.  ALLERGIES:  No Known Allergies  ROS: Pertinent items noted in HPI and remainder of comprehensive ROS otherwise negative.  HOME MEDS: Current Outpatient Prescriptions  Medication Sig Dispense Refill  . aspirin EC 81 MG tablet Take 1 tablet (81  mg total) by mouth daily. 30 tablet 0  . atorvastatin (LIPITOR) 80 MG tablet Take 1 tablet (80 mg total) by mouth daily at 6 PM. 30 tablet 9  . clopidogrel (PLAVIX) 75 MG tablet Take 1 tablet (75 mg total) by mouth daily. 30 tablet 0  . levothyroxine (SYNTHROID, LEVOTHROID) 50 MCG tablet Take 1 tablet (50 mcg total) by mouth daily. 45 tablet 1  . Multiple Vitamin (MULTIVITAMIN) tablet Take 1 tablet by mouth daily.    . nitroGLYCERIN (NITROSTAT) 0.4 MG SL tablet Place 1 tablet (0.4 mg total) under the tongue every 5 (five) minutes as needed for chest pain (up to 3 doses). 25 tablet 3  . NON FORMULARY Take 4 tablets by mouth daily.     Marland Kitchen testosterone (ANDROGEL) 50 MG/5GM (1%) GEL APPLY 1 PACKET ONCE DAILY AS DIRECTED 150 g 0  . traZODone (DESYREL) 50 MG tablet TAKE 1 TABLET BY MOUTH AT BEDTIME 90 tablet 1   No current facility-administered medications for this visit.     LABS/IMAGING: Results for orders placed or performed in visit on 04/16/16 (from the past 48 hour(s))  T4, free     Status: None   Collection Time: 04/16/16  8:43 AM  Result Value Ref Range   Free T4 0.87 0.60 - 1.60 ng/dL    Comment: Specimens from patients who are undergoing biotin therapy and /or ingesting biotin supplements may contain high levels of biotin.  The higher biotin concentration in these specimens interferes with this Free T4 assay.  Specimens that contain high levels  of biotin may cause false high results for this Free T4 assay.  Please interpret results in light of the total clinical presentation of the patient.    TSH     Status: Abnormal   Collection Time: 04/16/16  8:43 AM  Result Value Ref Range   TSH 5.55 (H) 0.35 - 4.50 uIU/mL   No results found.  WEIGHTS: Wt Readings from Last 3 Encounters:  04/17/16 190 lb 6.4 oz (86.4 kg)  03/03/16 190 lb (86.2 kg)  02/06/16 190 lb (86.2 kg)    VITALS: BP (!) 104/48   Pulse (!) 57   Ht 5\' 11"  (1.803 m)   Wt 190 lb 6.4 oz (86.4 kg)   BMI 26.56 kg/m    EXAM: General appearance: alert and no distress Lungs: clear to auscultation bilaterally Heart: regular rate and rhythm, S1,  S2 normal, no murmur, click, rub or gallop Extremities: extremities normal, atraumatic, no cyanosis or edema and Pacemaker site intact Skin: Skin color, texture, turgor normal. No rashes or lesions  EKG: A sensed, V paced rhythm at 57  ASSESSMENT: 1. CAD - s/p PCI to the D1 and mid LCX with DES (11/2015) 2. Complete heart block-s/p Medtronic pacemaker (10/2015) 3. Palpitations - PVCs including trigeminy and quadrigeminy, generally improved on b-blocker 4. 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 History of chest wall radiation for Hodgkin's lymphoma 6. Essential hypertension 7. Hypothyroidism  PLAN: 1.   Tyler Hendrix is feeling much better after revascularization and placement of a pacemaker. He is noted to have mildly elevated TSH. He's been seen by endocrinology and started on levothyroxine. Of note blood pressure was low today and we discussed switching him from amlodipine to an ARB for cardio vascular benefit. Plan switch to Diovan 80 as this is equivalent to amlodipine 5 mg daily. Follow-up with me in 6 months and will consider discontinuing clopidogrel that time.  Pixie Casino, MD, [facility] Attending Cardiologist [place] [facility] [place] 04/17/2016, 8:35 AM

## 2016-04-18 ENCOUNTER — Encounter (HOSPITAL_COMMUNITY): Payer: BLUE CROSS/BLUE SHIELD

## 2016-04-21 ENCOUNTER — Encounter (HOSPITAL_COMMUNITY): Payer: BLUE CROSS/BLUE SHIELD

## 2016-05-07 ENCOUNTER — Other Ambulatory Visit: Payer: Self-pay | Admitting: Internal Medicine

## 2016-05-07 ENCOUNTER — Telehealth: Payer: Self-pay | Admitting: Cardiology

## 2016-05-07 ENCOUNTER — Ambulatory Visit (INDEPENDENT_AMBULATORY_CARE_PROVIDER_SITE_OTHER): Payer: BLUE CROSS/BLUE SHIELD | Admitting: *Deleted

## 2016-05-07 DIAGNOSIS — I442 Atrioventricular block, complete: Secondary | ICD-10-CM | POA: Diagnosis not present

## 2016-05-07 NOTE — Telephone Encounter (Signed)
Spoke with pt and reminded pt of remote transmission that is due today. Pt verbalized understanding.   

## 2016-05-08 LAB — CUP PACEART REMOTE DEVICE CHECK
Battery Remaining Longevity: 111 mo
Battery Voltage: 3.03 V
Brady Statistic AP VP Percent: 41.53 %
Brady Statistic AS VS Percent: 0.06 %
Brady Statistic RA Percent Paced: 41.5 %
Implantable Lead Implant Date: 20170922
Implantable Lead Implant Date: 20170922
Implantable Lead Location: 753860
Implantable Lead Model: 5076
Implantable Pulse Generator Implant Date: 20170922
Lead Channel Impedance Value: 323 Ohm
Lead Channel Impedance Value: 418 Ohm
Lead Channel Impedance Value: 437 Ohm
Lead Channel Pacing Threshold Amplitude: 0.75 V
Lead Channel Pacing Threshold Amplitude: 0.75 V
Lead Channel Pacing Threshold Pulse Width: 0.4 ms
Lead Channel Pacing Threshold Pulse Width: 0.4 ms
Lead Channel Sensing Intrinsic Amplitude: 3.25 mV
Lead Channel Setting Pacing Amplitude: 2 V
MDC IDC LEAD LOCATION: 753859
MDC IDC MSMT LEADCHNL RA SENSING INTR AMPL: 3.25 mV
MDC IDC MSMT LEADCHNL RV IMPEDANCE VALUE: 589 Ohm
MDC IDC MSMT LEADCHNL RV SENSING INTR AMPL: 4.875 mV
MDC IDC MSMT LEADCHNL RV SENSING INTR AMPL: 4.875 mV
MDC IDC SESS DTM: 20180328212737
MDC IDC SET LEADCHNL RV PACING AMPLITUDE: 2.5 V
MDC IDC SET LEADCHNL RV PACING PULSEWIDTH: 0.4 ms
MDC IDC SET LEADCHNL RV SENSING SENSITIVITY: 2 mV
MDC IDC STAT BRADY AP VS PERCENT: 0.01 %
MDC IDC STAT BRADY AS VP PERCENT: 58.39 %
MDC IDC STAT BRADY RV PERCENT PACED: 99.9 %

## 2016-05-08 NOTE — Progress Notes (Signed)
Remote pacemaker transmission.   

## 2016-05-09 ENCOUNTER — Encounter: Payer: Self-pay | Admitting: Cardiology

## 2016-05-30 ENCOUNTER — Other Ambulatory Visit: Payer: Self-pay | Admitting: Internal Medicine

## 2016-06-02 NOTE — Telephone Encounter (Signed)
Pt is following up on his Refill for his Levothyroxine.  States he wants it as soon as possible.

## 2016-07-03 ENCOUNTER — Encounter: Payer: Self-pay | Admitting: Internal Medicine

## 2016-07-03 ENCOUNTER — Telehealth: Payer: Self-pay

## 2016-07-03 ENCOUNTER — Ambulatory Visit (INDEPENDENT_AMBULATORY_CARE_PROVIDER_SITE_OTHER): Payer: 59 | Admitting: Internal Medicine

## 2016-07-03 VITALS — BP 134/84 | HR 54 | Ht 72.0 in | Wt 190.0 lb

## 2016-07-03 DIAGNOSIS — E039 Hypothyroidism, unspecified: Secondary | ICD-10-CM

## 2016-07-03 LAB — T4, FREE: FREE T4: 0.79 ng/dL (ref 0.60–1.60)

## 2016-07-03 LAB — TSH: TSH: 5.55 u[IU]/mL — AB (ref 0.35–4.50)

## 2016-07-03 MED ORDER — LEVOTHYROXINE SODIUM 75 MCG PO TABS: ORAL_TABLET | ORAL | 2 refills | Status: DC

## 2016-07-03 NOTE — Telephone Encounter (Signed)
LVM, gave lab results. Gave call back number if any questions or concerns.  

## 2016-07-03 NOTE — Patient Instructions (Signed)
Please continue Levothyroxine 50 mcg daily.  Take the thyroid hormone every day, with water, at least 30 minutes before breakfast, separated by at least 4 hours from: - acid reflux medications - calcium - iron - multivitamins   Please stop at the lab.  Please come back for a follow-up appointment in 6 months

## 2016-07-03 NOTE — Progress Notes (Signed)
Patient ID: Tyler Hendrix, male   DOB: 11/25/1955, 61 y.o.   MRN: [id number]    HPI  Tyler Hendrix is a 61 y.o.-year-old male, returning for follow-up for hypothyroidism. Last visit 4 months ago.   Reviewed and addended history: Pt. has been dx with hypothyroidism in ~2010. He has been found to have a high TSH during investigation for AVB.   We started levothyroxine low-dose at last visit. He feels better on this.  I reviewed pt's thyroid tests: Lab Results  Component Value Date   TSH 5.55 (H) 04/16/2016   TSH 9.38 (H) 01/01/2016   TSH 9.07 (H) 10/29/2015   TSH 4.76 (H) 04/20/2014   TSH 4.82 10/17/2011   TSH 5.14 05/20/2010   TSH 9.351 (H) 03/16/2009   TSH 5.064 (H) 03/17/2008   TSH 4.861 03/12/2007   TSH 4.984 02/13/2006   FREET4 0.87 04/16/2016   FREET4 1.0 01/01/2016    He is taking  levothyroxine 50 mcg daily (did not increase to 75 mcg daily as he did not get the message...), taken: - in am - fasting - at least 30 min from b'fast - no Ca, Fe - + MVI midday - no PPIs - not on Biotin  Pt denies: - feeling nodules in neck - hoarseness - dysphagia - choking - SOB with lying down  She has no FH of thyroid disorders.  + h/o radiation tx to head or neck  - 2002. No FH of thyroid cancer.  No seaweed or kelp. No recent contrast studies. No herbal supplements. No Biotin use. No recent steroids use.   Pt. 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 He had RxTx and ChTx in 2002 for Hodgkin Lymphoma.  ROS: Constitutional: no weight gain/no weight loss, no fatigue, no subjective hyperthermia, no subjective hypothermia Eyes: no blurry vision, no xerophthalmia ENT: no sore throat, no nodules palpated in throat, no dysphagia, no odynophagia, no hoarseness Cardiovascular: no CP/+ SOB/no palpitations/no leg swelling Respiratory: no cough/+ SOB/+ wheezing Gastrointestinal: no N/no V/no D/no C/no acid reflux Musculoskeletal: no muscle aches/no  joint aches Skin: no rashes, no hair loss Neurological: no tremors/no numbness/no tingling/no dizziness  I reviewed pt's medications, allergies, PMH, social hx, family hx, and changes were documented in the history of present illness. Otherwise, unchanged from my initial visit note. Stopped Amlodipine and started Valsartan (Dr. Rennis Golden).  Past Medical History:  Diagnosis Date  . Abnormal TSH   . CAD in native artery    a. 11/2015: s/p 2 vessel PCI of the first diagonal branch of the LAD in the mid left circumflex with DES.  Marland Kitchen Complete heart block (HCC)    a. s/p Medtronic ppm 10/2015.  Marland Kitchen Hodgkin disease (HCC)    a. s/p chest wall radiation.  . Pacemaker 10/2015   Medtronic  . PVC's (premature ventricular contractions)   . Right bundle branch block    Past Surgical History:  Procedure Laterality Date  . CARDIAC CATHETERIZATION N/A 11/02/2015   Procedure: Left Heart Cath and Coronary Angiography;  Surgeon: Tonny Bollman, MD;  Location: [facility] INVASIVE CV LAB;  Service: Cardiovascular;  Laterality: N/A;  . CARDIAC CATHETERIZATION N/A 11/21/2015   Procedure: Coronary Stent Intervention;  Surgeon: Tonny Bollman, MD;  Location: [facility] INVASIVE CV LAB;  Service: Cardiovascular;  Laterality: N/A;  . EP IMPLANTABLE DEVICE N/A 11/02/2015   MDT Adivisa MRI conditional dual chamber pacemaker implanted by Dr Johney Frame for complete heart block  . KNEE  SURGERY Right 1972  . LUMBAR LAMINECTOMY  1996   Social History   Social History  . Marital status: Married    Spouse name: N/A  . Number of children: 2  . 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 Smoking status: Never Smoker  . Smokeless tobacco: Never Used  . Alcohol use 3.0 oz/week    5 Glasses of wine per week  . Drug use: No  . Sexual activity: Not on file   Other Topics Concern  . 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 ANDROGEL 50 MG/5GM (1%) GEL APPLY 1 PACKET EVERY DAY AS DIRECTED 150 g 0  . aspirin EC 81 MG tablet Take 1 tablet (81 mg total) by mouth daily. 30 tablet 0  . atorvastatin (LIPITOR) 80 MG tablet Take 1 tablet (80 mg total) by mouth daily at 6 PM. 30 tablet 9  . clopidogrel (PLAVIX) 75 MG tablet Take 1 tablet (75 mg total) by mouth daily. 30 tablet 0  . levothyroxine (SYNTHROID, LEVOTHROID) 50 MCG tablet TAKE 1 TABLET(50 MCG) BY MOUTH DAILY 45 tablet 0  . Multiple Vitamin (MULTIVITAMIN) tablet Take 1 tablet by mouth daily.    . nitroGLYCERIN (NITROSTAT) 0.4 MG SL tablet Place 1 tablet (0.4 mg total) under the tongue every 5 (five) minutes as needed for chest pain (up to 3 doses). 25 tablet 3  . NON FORMULARY Take 4 tablets by mouth daily.     . traZODone (DESYREL) 50 MG tablet TAKE 1 TABLET BY MOUTH AT BEDTIME 90 tablet 1  . valsartan (DIOVAN) 80 MG tablet Take 1 tablet (80 mg total) by mouth daily. 90 tablet 3   No current facility-administered medications on file prior to visit.    No Known Allergies Family History  Problem Relation Age of Onset  . Cancer Unknown        prostate father hx  . Heart disease Mother   . Heart disease Father   . Colon cancer Neg Hx     PE: BP 134/84 (BP Location: Left Arm, Patient Position: Sitting)   Pulse (!) 54   Ht 6' (1.829 m)   Wt 190 lb (86.2 kg)   SpO2 98%   BMI 25.77 kg/m  Wt Readings from Last 3 Encounters:  07/03/16 190 lb (86.2 kg)  04/17/16 190 lb 6.4 oz (86.4 kg)  03/03/16 190 lb (86.2 kg)   Constitutional: normal weight, in NAD Eyes: PERRLA, EOMI, no exophthalmos ENT: moist mucous membranes, no thyromegaly, no cervical lymphadenopathy Cardiovascular: RRR, No MRG Respiratory: CTA B Gastrointestinal: abdomen soft, NT, ND, BS+ Musculoskeletal: no deformities, strength intact in all 4 Skin: moist, warm, no rashes Neurological:  no tremor with outstretched hands, DTR normal in all 4   ASSESSMENT: 1. Subclinical Hypothyroidism - He does not appear to have a goiter, thyroid nodules, or neck compression symptoms  PLAN:  1. Patient with long-standing subclinical hypothyroidism, now on Low-dose levothyroxine therapy - started at last visit as he was more fatigued and cold. He has been having fluctuating thyroid tests for years, now improved after starting levothyroxine. He did not get the message to increase the dose after labs in 04/2016. I advised him to get MyChart so I can  contact him directly. - he continues on LT4 50 mcg daily  - pt feels better after he started this, but still slight fatigue - we discussed about taking the thyroid hormone every day, with water, >30 minutes before breakfast, separated by >4 hours from acid reflux medications, calcium, iron, multivitamins. Pt. is taking it correctly. - will check thyroid tests today: TSH and fT4 - If labs are abnormal, he will need to return for repeat TFTs in 1.5 months - OTW, RTC in 6 mo  Needs refills.  Component     Latest Ref Rng & Units 07/03/2016  TSH     0.35 - 4.50 uIU/mL 5.55 (H)  T4,Free(Direct)     0.60 - 1.60 ng/dL 0.86  TSH still high >> will increase LT4 to 75 mcg daily and repeat the tests in 2 months.  Carlus Pavlov, MD PhD [facility] Endocrinology

## 2016-07-03 NOTE — Telephone Encounter (Signed)
-----   Message from Philemon Kingdom, MD sent at 07/03/2016  1:00 PM EDT ----- [name], can you please call pt: TSH still high >> will need to increase LT4 to 75 mcg daily as we discussed and repeat the tests in 2 months. Labs are in. I sent the new dose Rx.

## 2016-07-30 ENCOUNTER — Ambulatory Visit (INDEPENDENT_AMBULATORY_CARE_PROVIDER_SITE_OTHER): Payer: 59 | Admitting: *Deleted

## 2016-07-30 DIAGNOSIS — I442 Atrioventricular block, complete: Secondary | ICD-10-CM

## 2016-07-31 NOTE — Progress Notes (Signed)
Remote pacemaker transmission.   

## 2016-08-01 ENCOUNTER — Encounter: Payer: Self-pay | Admitting: Cardiology

## 2016-08-06 LAB — CUP PACEART REMOTE DEVICE CHECK
Battery Remaining Longevity: 107 mo
Battery Voltage: 3.02 V
Brady Statistic RA Percent Paced: 44.56 %
Brady Statistic RV Percent Paced: 99.83 %
Date Time Interrogation Session: 20180620114003
Implantable Lead Implant Date: 20170922
Implantable Lead Location: 753859
Implantable Lead Location: 753860
Implantable Lead Model: 5076
Implantable Pulse Generator Implant Date: 20170922
Lead Channel Impedance Value: 589 Ohm
Lead Channel Pacing Threshold Pulse Width: 0.4 ms
Lead Channel Sensing Intrinsic Amplitude: 2.625 mV
Lead Channel Sensing Intrinsic Amplitude: 2.625 mV
Lead Channel Setting Pacing Pulse Width: 0.4 ms
Lead Channel Setting Sensing Sensitivity: 2 mV
MDC IDC LEAD IMPLANT DT: 20170922
MDC IDC MSMT LEADCHNL RA IMPEDANCE VALUE: 323 Ohm
MDC IDC MSMT LEADCHNL RA IMPEDANCE VALUE: 456 Ohm
MDC IDC MSMT LEADCHNL RA PACING THRESHOLD AMPLITUDE: 0.75 V
MDC IDC MSMT LEADCHNL RV IMPEDANCE VALUE: 437 Ohm
MDC IDC MSMT LEADCHNL RV PACING THRESHOLD AMPLITUDE: 0.75 V
MDC IDC MSMT LEADCHNL RV PACING THRESHOLD PULSEWIDTH: 0.4 ms
MDC IDC MSMT LEADCHNL RV SENSING INTR AMPL: 8.75 mV
MDC IDC MSMT LEADCHNL RV SENSING INTR AMPL: 8.75 mV
MDC IDC SET LEADCHNL RA PACING AMPLITUDE: 2 V
MDC IDC SET LEADCHNL RV PACING AMPLITUDE: 2.5 V
MDC IDC STAT BRADY AP VP PERCENT: 44.67 %
MDC IDC STAT BRADY AP VS PERCENT: 0.02 %
MDC IDC STAT BRADY AS VP PERCENT: 55.22 %
MDC IDC STAT BRADY AS VS PERCENT: 0.09 %

## 2016-09-11 ENCOUNTER — Telehealth: Payer: Self-pay | Admitting: Internal Medicine

## 2016-09-11 ENCOUNTER — Other Ambulatory Visit (INDEPENDENT_AMBULATORY_CARE_PROVIDER_SITE_OTHER): Payer: 59

## 2016-09-11 DIAGNOSIS — E039 Hypothyroidism, unspecified: Secondary | ICD-10-CM

## 2016-09-11 LAB — T4, FREE: Free T4: 1.14 ng/dL (ref 0.60–1.60)

## 2016-09-11 LAB — TSH: TSH: 4.1 u[IU]/mL (ref 0.35–4.50)

## 2016-09-11 MED ORDER — OLMESARTAN MEDOXOMIL 20 MG PO TABS: 10.0000 mg | ORAL_TABLET | Freq: Every day | ORAL | 1 refills | Status: DC

## 2016-09-11 NOTE — Telephone Encounter (Signed)
Received fax notification from patient requesting med change from valsartan d/t recall.  Medication will be changed to olmesartan 10mg  daily Rx(s) sent to pharmacy electronically. Patient aware & advised to track home BPs every 2-3 days for about 2 weeks to ensure no significant change in BP readings

## 2016-09-16 ENCOUNTER — Telehealth: Payer: Self-pay

## 2016-09-16 NOTE — Telephone Encounter (Signed)
Called patient and gave lab results. Patient had no questions or concerns. Patient will call back to make appointment.

## 2016-09-16 NOTE — Telephone Encounter (Signed)
-----   Message from Philemon Kingdom, MD sent at 09/15/2016  5:25 PM EDT ----- [name], can you please call pt: Thyroid tests are now normal. Please continue current dose of levothyroxine and I will recheck his levels when he comes back. Let's schedule another appointment around end of November-December.

## 2016-09-29 ENCOUNTER — Telehealth: Payer: Self-pay | Admitting: Internal Medicine

## 2016-09-29 MED ORDER — OLMESARTAN MEDOXOMIL 20 MG PO TABS: 20.0000 mg | ORAL_TABLET | Freq: Every day | ORAL | 3 refills | Status: DC

## 2016-09-29 NOTE — Telephone Encounter (Signed)
Spoke with pt, he did not read the directions on the bottle and has been taking 20 mg of olmesartan instead of 10 mg. He reports his bp is running what it was on valsartan and he is feeling fine. He will continue 20 mg once daily. New script sent to the pharmacy.

## 2016-09-29 NOTE — Telephone Encounter (Signed)
New message    Pt is calling. He said he is out of his medication, because instead of taking a half of a pill he took the whole pill. He did not have the medication in front of him but it's in place of the medication that was recalled. He needs a new prescription.

## 2016-10-15 ENCOUNTER — Other Ambulatory Visit: Payer: Self-pay | Admitting: Internal Medicine

## 2016-10-15 ENCOUNTER — Telehealth: Payer: Self-pay | Admitting: Internal Medicine

## 2016-10-15 NOTE — Telephone Encounter (Signed)
Pa approved, form faxed back to pharmacy. 

## 2016-10-15 NOTE — Telephone Encounter (Signed)
Received PA request for Testim 1 % gel. PA submitted & is pending. Key: [id number]

## 2016-10-17 ENCOUNTER — Encounter (INDEPENDENT_AMBULATORY_CARE_PROVIDER_SITE_OTHER): Payer: Self-pay

## 2016-10-17 ENCOUNTER — Encounter: Payer: Self-pay | Admitting: Internal Medicine

## 2016-10-17 ENCOUNTER — Ambulatory Visit (INDEPENDENT_AMBULATORY_CARE_PROVIDER_SITE_OTHER): Payer: 59 | Admitting: Internal Medicine

## 2016-10-17 VITALS — BP 112/68 | HR 53 | Ht 71.0 in | Wt 189.0 lb

## 2016-10-17 DIAGNOSIS — E785 Hyperlipidemia, unspecified: Secondary | ICD-10-CM

## 2016-10-17 DIAGNOSIS — Z955 Presence of coronary angioplasty implant and graft: Secondary | ICD-10-CM

## 2016-10-17 DIAGNOSIS — I10 Essential (primary) hypertension: Secondary | ICD-10-CM | POA: Diagnosis not present

## 2016-10-17 DIAGNOSIS — Z950 Presence of cardiac pacemaker: Secondary | ICD-10-CM

## 2016-10-17 NOTE — Progress Notes (Signed)
OFFICE NOTE  Chief Complaint:  Feels well  Primary Care Physician: Marletta Lor, MD  HPI:  Tyler Hendrix is a pleasant 61 year old male who was previously seen by Dr. Acie Fredrickson in September of this past year. He is an add on to my schedule today for acute symptoms of palpitations. He reports over the past several days she's had skipped heartbeats and higher heart rates. He says his heart rate is typically in the 60s however recently has been in the 80s. He's also felt some skipped beats and they were happening 4-5 times a minute. He denies any chest pain or worsening shortness of breath. He's never had a stress test or any coronary disease evaluation. He does have a history of Hodgkin's lymphoma and had chest wall radiation. In September he had an echocardiogram which showed normal systolic function and it was felt that he was doing fairly well. He continued to do exercise which she has recently without any significant symptoms. 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   Mr. Geesey returns today for follow-up. Overall he is feeling fairly well. He reports his palpitations have improved somewhat but he still feeling him. While wearing the monitor he was noted to have significant ventricular ectopy including isolated PVCs, and ventricular trigeminy and quadrigeminy. His nuclear stress test was negative for ischemia but was not gated. He did have an echo last fall which showed an EF of 55% which is reassuring.   At the pleasure see Mr. Caamano back in the office today for follow-up. Overall he thinks he is doing much better on the high-dose of metoprolol 25 mg daily. Although he's tolerating this with very few palpitations, he still has some breakthrough in the afternoon. He generally takes the medicine in the morning. His EKG today however does show a low heart rate of 48, with a short  period to sinus rhythm and a competing junctional rhythm. I've had concerns about underlying conduction system disease based on his EKG showing a right bundle branch block and now there is some evidence of a junctional bradycardia. Despite this he has a good energy level, denies chest pain and is able to maintain normal physical activity. His wife recently had their new baby about 2 weeks ago.  06/22/2015  Mr. Knoch returns today for follow-up. He reports that he is overall done fairly well. He denies any worsening shortness of breath or fatigue. Heart rate remains in the upper 40s although he has very little ventricular ectopy and his EKG today. He denies any significant palpitations. I think we found a balance currently between low-dose beta blocker and his underlying conduction disease. Should he have more symptoms however he may need antiarrhythmic medication. I do not see any features that are concerning for pacemaker at this point. Blood pressure, however remains elevated and not at goal.  10/04/2015  Mr. Henrickson was seen back today in follow-up. He reports a marked improvement in his PVCs however over the past week he's felt slightly more short of breath with exertion. He did not voluntarily mention this yet required significant persistent questioning on my part to get an answer out of him. He also said that he might have a little pressure in his chest when he tried to exercise. He denied any pain, dizziness, presyncope or syncopal symptoms. Blood pressure is improved somewhat after the  addition of amlodipine. He's no longer having any PVCs however his EKG is abnormal today showing a new complete heart block. Heart rate is 44 with a sinus rhythm and wide QRS. There are inferior T-wave abnormalities which were previously seen on his EKG.  10/05/2015  [name] was seen today in follow-up of complete heart block. This was noted yesterday during his office visit. Heart rate was in the 40s and he was  somewhat diaphoretic. He denied any chest pain or shortness of breath but has felt fatigue recently. He had taken Toprol XL the night before and I advised him to not take any last evening and allow the medicine to wash out of his system. A today he is a heart rate has improved up to 88 and is in sinus rhythm with first-degree AV block and bifascicular block. He thinks he actually feels a little better today although was not diaphoretic. He said he didn't sleep well last night and had some diarrhea this morning. He does not noted change in energy level although he feels that he's had some recurrent palpitations.  01/01/2016  [name] returns today for follow-up. He underwent placement of a pacemaker which was uneventful for complete heart block. He says since that time his color is improved energy is better. Heart rate today is 61 however the pacer is set at a backup rate of 50. He was concerned that this may be still causing a little fatigue but I reassured him that I think that his heart rate is over that number and unlikely to be the cause. Blood pressure is well-controlled. The pacemaker site looks appropriate without any hematoma or effusion. He was recently seen by Almyra Deforest, PA-C , and was noted to have an elevated TSH. This is been elevated in the past but was as high as 9. Free T4 was ordered and those labs are pending. He did have radiation to his thyroid as part of treatment for Hodgkin's lymphoma.  04/18/2015  [name] was seen today in follow-up. Overall he seems to be doing very well. After discharge his amlodipine was increased to 10 mg from 5 mg. I rechecked his blood pressure today was 104/48, and he has noted that occasionally he gets a flushed feeling when exercising. I suspect he may be on too much medicine. We did discuss that amlodipine although does have a benefit for controlling blood pressure, has no intrinsic cardiovascular benefit. He does have normal renal function would probably  benefit from being on an ACE or ARB. He denies any chest pain or worsening shortness of breath. No problems with his pacemaker. He has been seeing an endocrinologist and his thyroid function has normalized on low-dose levothyroxine.  10/17/2016  Mr. Manship returns today for follow-up. He continues to do well. Recently he had his valsartan and switch to [place] losartan. He was feeling a little fatigue any decrease the dose from 20 mg to 10 mg daily. Blood pressure he said was running low for him. Today seems to be normal at 112/68. He is going to see his primary care provider about retesting of his testosterone. Apparently in the past this is been low as a result of chemotherapy and radiation. 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 He denies any chest pain. He's had no presyncope or syncopal symptoms. His pacemaker is been functioning appropriately on remote checks. This now almost one year since his two-vessel PCI.  PMHx:  Past Medical History:  Diagnosis Date  . Abnormal TSH   . CAD in native artery    a. 11/2015: s/p 2 vessel PCI of the first diagonal branch of the LAD in the mid left circumflex with DES.  Marland Kitchen Complete heart block (HCC)    a. s/p Medtronic ppm 10/2015.  Marland Kitchen Hodgkin disease ([place])    a. s/p chest wall radiation.  . Pacemaker 10/2015   Medtronic  . PVC's (premature ventricular contractions)   . Right bundle branch block     Past Surgical History:  Procedure Laterality Date  . CARDIAC CATHETERIZATION N/A 11/02/2015   Procedure: Left Heart Cath and Coronary Angiography;  Surgeon: Sherren Mocha, MD;  Location: [place] CV LAB;  Service: Cardiovascular;  Laterality: N/A;  . CARDIAC CATHETERIZATION N/A 11/21/2015   Procedure: Coronary Stent Intervention;  Surgeon: Sherren Mocha, MD;  Location: [place] CV LAB;  Service: Cardiovascular;  Laterality: N/A;  . EP IMPLANTABLE DEVICE N/A 11/02/2015     MDT Adivisa MRI conditional dual chamber pacemaker implanted by Dr Rayann Heman for complete heart block  . KNEE SURGERY Right 1972  . LUMBAR LAMINECTOMY  1996    FAMHx:  Family History  Problem Relation Age of Onset  . Cancer Unknown        prostate father hx  . Heart disease Mother   . Heart disease Father   . Colon cancer Neg Hx     SOCHx:   reports that he has never smoked. He has never used smokeless tobacco. He reports that he drinks about 3.0 oz of alcohol per week . He reports that he does not use drugs.  ALLERGIES:  No Known Allergies  ROS: Pertinent items noted in HPI and remainder of comprehensive ROS otherwise negative.  HOME MEDS: Current Outpatient Prescriptions  Medication Sig Dispense Refill  . aspirin EC 81 MG tablet Take 1 tablet (81 mg total) by mouth daily. 30 tablet 0  . atorvastatin (LIPITOR) 80 MG tablet Take 1 tablet (80 mg total) by mouth daily at 6 PM. 30 tablet 9  . clopidogrel (PLAVIX) 75 MG tablet Take 1 tablet (75 mg total) by mouth daily. 30 tablet 0  . levothyroxine (SYNTHROID, LEVOTHROID) 75 MCG tablet TAKE 1 TABLET(75 MCG) BY MOUTH DAILY 60 tablet 2  . Multiple Vitamin (MULTIVITAMIN) tablet Take 1 tablet by mouth daily.    . nitroGLYCERIN (NITROSTAT) 0.4 MG SL tablet Place 1 tablet (0.4 mg total) under the tongue every 5 (five) minutes as needed for chest pain (up to 3 doses). 25 tablet 3  . NON FORMULARY Take 4 tablets by mouth daily.     Marland Kitchen olmesartan (BENICAR) 20 MG tablet Take 10 mg by mouth daily.    Marland Kitchen testosterone (ANDROGEL) 50 MG/5GM (1%) GEL APPLY CONTENTS OF 1 PACKET TOPICALLY EVERY DAY AS DIRECTED 150 g 0  . traZODone (DESYREL) 50 MG tablet TAKE 1 TABLET BY MOUTH AT BEDTIME 90 tablet 1   No current facility-administered medications for this visit.     LABS/IMAGING: No results found for this or any previous visit (from the past 48 hour(s)). No results found.  WEIGHTS: Wt Readings from Last 3 Encounters:  10/17/16 189 lb (85.7 kg)   07/03/16 190 lb (86.2 kg)  04/17/16 190 lb 6.4 oz (86.4 kg)    VITALS: BP 112/68   Pulse (!) 53   Ht 5\' 11"  (1.803 m)   Wt 189 lb (85.7 kg)   BMI 26.36 kg/m   EXAM:  General appearance: alert and no distress Lungs: clear to auscultation bilaterally Heart: regular rate and rhythm, S1, S2 normal, no murmur, click, rub or gallop Extremities: extremities normal, atraumatic, no cyanosis or edema and Pacemaker site intact Skin: Skin color, texture, turgor normal. No rashes or lesions  EKG: Atrial sensed V paced rhythm with prolonged AV conduction at 53 -personally reviewed  ASSESSMENT: 1. CAD - s/p PCI to the D1 and mid LCX with DES (11/2015) 2. Complete heart block-s/p Medtronic pacemaker (10/2015) 3. Palpitations - PVCs including trigeminy and quadrigeminy, generally improved on b-blocker 4. 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 History of chest wall radiation for Hodgkin's lymphoma 6. Essential hypertension 7. Hypothyroidism  PLAN: 1.   Mr. Furia is doing well. His stents were placed in October 2017 and his pacemaker was placed in September 2017. He likely can discontinue clopidogrel at the end of October. That will be one year after his stents. He denies any recurrent palpitations or presyncope or syncopal symptoms. He has had some fatigue. Blood pressure is well controlled and recently he cut back on his on losartan. He was changed to that from valsartan and due to recall. He is thyroid is followed by endocrinology and he is going to have his testosterone looked at again. In the past she had been on some AndroGel.  Follow-up with me in 6 months.  Pixie Casino, MD, [facility] Attending Cardiologist [place] 10/17/2016, 8:37 AM

## 2016-10-17 NOTE — Patient Instructions (Addendum)
Your physician wants you to follow-up in: 6 months with Dr. Debara Pickett. You will receive a reminder letter in the mail two months in advance. If you don't receive a letter, please call our office to schedule the follow-up appointment.  STOP clopidogrel end of October 2018

## 2016-10-21 ENCOUNTER — Ambulatory Visit (INDEPENDENT_AMBULATORY_CARE_PROVIDER_SITE_OTHER): Payer: 59 | Admitting: Internal Medicine

## 2016-10-21 ENCOUNTER — Encounter: Payer: Self-pay | Admitting: Internal Medicine

## 2016-10-21 VITALS — BP 150/80 | HR 53 | Temp 98.0°F | Wt 191.0 lb

## 2016-10-21 DIAGNOSIS — E034 Atrophy of thyroid (acquired): Secondary | ICD-10-CM | POA: Diagnosis not present

## 2016-10-21 DIAGNOSIS — I10 Essential (primary) hypertension: Secondary | ICD-10-CM | POA: Diagnosis not present

## 2016-10-21 DIAGNOSIS — I2510 Atherosclerotic heart disease of native coronary artery without angina pectoris: Secondary | ICD-10-CM | POA: Diagnosis not present

## 2016-10-21 MED ORDER — TESTOSTERONE 50 MG/5GM (1%) TD GEL: TRANSDERMAL | 0 refills | Status: DC

## 2016-10-21 NOTE — Progress Notes (Signed)
Subjective:    Patient ID: DMIR RIEF, male    DOB: 1955/02/19, 61 y.o.   MRN: [id number]  HPI  61 year old patient who is seen today in follow-up. Medical regimen includes topical testosterone replacement therapy.  He was placed on this in the past following radiation treatment for Hodgkin's .  He only takes sporadically.  He did have an unintentional pregnancy a couple of years ago. He has essential hypertension .  He is followed by cardiology for coronary artery disease and is status post stenting.  He will complete 1 year of Plavix soon. He has hypothyroidism and dyslipidemia  Past Medical History:  Diagnosis Date  . Abnormal TSH   . CAD in native artery    a. 11/2015: s/p 2 vessel PCI of the first diagonal branch of the LAD in the mid left circumflex with DES.  Marland Kitchen Complete heart block (HCC)    a. s/p Medtronic ppm 10/2015.  Marland Kitchen Hodgkin disease (HCC)    a. s/p chest wall radiation.  . Pacemaker 10/2015   Medtronic  . PVC's (premature ventricular contractions)   . Right bundle branch block      Social History   Social History  . Marital status: Married    Spouse name: N/A  . Number of children: 2  . Years of education: Masters   Occupational History  . lawyer     [employer]   Social History Main Topics  . Smoking status: Never Smoker  . Smokeless tobacco: Never Used  . Alcohol use 3.0 oz/week    5 Glasses of wine per week  . Drug use: No  . Sexual activity: Not on file   Other Topics Concern  . 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 CARDIAC CATHETERIZATION N/A 11/02/2015   Procedure: Left Heart Cath and Coronary Angiography;  Surgeon: Tonny Bollman, MD;  [facility]: [facility] INVASIVE CV LAB;  Service: Cardiovascular;  Laterality: N/A;  . CARDIAC CATHETERIZATION N/A 11/21/2015   Procedure: Coronary Stent Intervention;  Surgeon: Tonny Bollman, MD;   Location: [facility] INVASIVE CV LAB;  Service: Cardiovascular;  Laterality: N/A;  . EP IMPLANTABLE DEVICE N/A 11/02/2015   MDT Adivisa MRI conditional dual chamber pacemaker implanted by Dr Johney Frame for complete heart block  . KNEE SURGERY Right 1972  . LUMBAR LAMINECTOMY  1996    Family History  Problem Relation Age of Onset  . Cancer Unknown        prostate father hx  . Heart disease Mother   . Heart disease Father   . Colon cancer Neg Hx     No Known Allergies  Current Outpatient Prescriptions on File Prior to Visit  Medication Sig Dispense Refill  . aspirin EC 81 MG tablet Take 1 tablet (81 mg total) by mouth daily. 30 tablet 0  . atorvastatin (LIPITOR) 80 MG tablet Take 1 tablet (80 mg total) by mouth daily at 6 PM. 30 tablet 9  . clopidogrel (PLAVIX) 75 MG tablet Take 1 tablet (75 mg total) by mouth daily. 30 tablet 0  . levothyroxine (SYNTHROID, LEVOTHROID) 75 MCG tablet TAKE 1 TABLET(75 MCG) BY MOUTH DAILY 60 tablet 2  . Multiple Vitamin (MULTIVITAMIN) tablet Take 1 tablet by mouth daily.    . nitroGLYCERIN (NITROSTAT) 0.4 MG SL tablet Place 1 tablet (0.4 mg total) under the tongue every 5 (five) minutes as  needed for chest pain (up to 3 doses). 25 tablet 3  . NON FORMULARY Take 4 tablets by mouth daily.     Marland Kitchen olmesartan (BENICAR) 20 MG tablet Take 20 mg by mouth daily.    . traZODone (DESYREL) 50 MG tablet TAKE 1 TABLET BY MOUTH AT BEDTIME 90 tablet 1   No current facility-administered medications on file prior to visit.     BP (!) 150/80 (BP Location: Left Arm, Patient Position: Sitting, Cuff Size: Normal)   Pulse (!) 53   Temp 98 F (36.7 C) (Oral)   Wt 191 lb (86.6 kg)   SpO2 98%   BMI 26.64 kg/m      Review of Systems  Constitutional: Negative for appetite change, chills, fatigue and fever.  HENT: Negative for congestion, dental problem, ear pain, hearing loss, sore throat, tinnitus, trouble swallowing and voice change.   Eyes: Negative for pain, discharge and  visual disturbance.  Respiratory: Negative for cough, chest tightness, wheezing and stridor.   Cardiovascular: Negative for chest pain, palpitations and leg swelling.  Gastrointestinal: Negative for abdominal distention, abdominal pain, blood in stool, constipation, diarrhea, nausea and vomiting.  Genitourinary: Negative for difficulty urinating, discharge, flank pain, genital sores, hematuria and urgency.  Musculoskeletal: Negative for arthralgias, back pain, gait problem, joint swelling, myalgias and neck stiffness.  Skin: Negative for rash.  Neurological: Negative for dizziness, syncope, speech difficulty, weakness, numbness and headaches.  Hematological: Negative for adenopathy. Does not bruise/bleed easily.  Psychiatric/Behavioral: Negative for behavioral problems and dysphoric mood. The patient is not nervous/anxious.        Objective:   Physical Exam  Constitutional: He is oriented to person, place, and time. He appears well-developed.  Blood pressure 140/80  HENT:  Head: Normocephalic.  Right Ear: External ear normal.  Left Ear: External ear normal.  Eyes: Conjunctivae and EOM are normal.  Neck: Normal range of motion.  Cardiovascular: Normal rate and normal heart sounds.   Pulmonary/Chest: Breath sounds normal.  Abdominal: Bowel sounds are normal.  Musculoskeletal: Normal range of motion. He exhibits no edema or tenderness.  Neurological: He is alert and oriented to person, place, and time.  Psychiatric: He has a normal mood and affect. His behavior is normal.          Assessment & Plan:   Essential hypertension.   Increase Benicar to 20 mg daily Hypothyroidism Testosterone deficiency.  AndroGel refilled Coronary artery disease.  Follow cardiology  CPX one year Medications updated  Rogelia Boga

## 2016-10-21 NOTE — Patient Instructions (Signed)
Increase Benicar 20 mg daily  Limit your sodium (Salt) intake    It is important that you exercise regularly, at least 20 minutes 3 to 4 times per week.  If you develop chest pain or shortness of breath seek  medical attention.  Endocrinology and cardiology follow-up as planned

## 2016-10-29 ENCOUNTER — Telehealth: Payer: Self-pay | Admitting: Cardiology

## 2016-10-29 ENCOUNTER — Ambulatory Visit (INDEPENDENT_AMBULATORY_CARE_PROVIDER_SITE_OTHER): Payer: 59 | Admitting: *Deleted

## 2016-10-29 ENCOUNTER — Other Ambulatory Visit: Payer: Self-pay | Admitting: Internal Medicine

## 2016-10-29 DIAGNOSIS — I442 Atrioventricular block, complete: Secondary | ICD-10-CM

## 2016-10-29 NOTE — Telephone Encounter (Signed)
Spoke with pt and reminded pt of remote transmission that is due today. Pt verbalized understanding.   

## 2016-10-30 LAB — CUP PACEART REMOTE DEVICE CHECK
Battery Remaining Longevity: 99 mo
Brady Statistic AP VS Percent: 0.02 %
Brady Statistic AS VP Percent: 58.51 %
Brady Statistic AS VS Percent: 0.1 %
Date Time Interrogation Session: 20180919235738
Implantable Lead Implant Date: 20170922
Implantable Lead Location: 753859
Implantable Lead Location: 753860
Implantable Lead Model: 5076
Implantable Lead Model: 5076
Lead Channel Impedance Value: 323 Ohm
Lead Channel Impedance Value: 437 Ohm
Lead Channel Impedance Value: 551 Ohm
Lead Channel Pacing Threshold Amplitude: 0.75 V
Lead Channel Sensing Intrinsic Amplitude: 2.25 mV
Lead Channel Sensing Intrinsic Amplitude: 2.25 mV
Lead Channel Sensing Intrinsic Amplitude: 24.625 mV
Lead Channel Sensing Intrinsic Amplitude: 24.625 mV
Lead Channel Setting Pacing Amplitude: 2.5 V
Lead Channel Setting Pacing Pulse Width: 0.4 ms
MDC IDC LEAD IMPLANT DT: 20170922
MDC IDC MSMT BATTERY VOLTAGE: 3.01 V
MDC IDC MSMT LEADCHNL RA PACING THRESHOLD AMPLITUDE: 0.625 V
MDC IDC MSMT LEADCHNL RA PACING THRESHOLD PULSEWIDTH: 0.4 ms
MDC IDC MSMT LEADCHNL RV IMPEDANCE VALUE: 418 Ohm
MDC IDC MSMT LEADCHNL RV PACING THRESHOLD PULSEWIDTH: 0.4 ms
MDC IDC PG IMPLANT DT: 20170922
MDC IDC SET LEADCHNL RA PACING AMPLITUDE: 2 V
MDC IDC SET LEADCHNL RV SENSING SENSITIVITY: 2 mV
MDC IDC STAT BRADY AP VP PERCENT: 41.37 %
MDC IDC STAT BRADY RA PERCENT PACED: 41.35 %
MDC IDC STAT BRADY RV PERCENT PACED: 99.83 %

## 2016-10-30 NOTE — Progress Notes (Signed)
Remote pacemaker transmission.   

## 2016-10-31 ENCOUNTER — Encounter: Payer: Self-pay | Admitting: Cardiology

## 2016-11-01 ENCOUNTER — Other Ambulatory Visit: Payer: Self-pay | Admitting: Nurse Practitioner

## 2016-11-04 NOTE — Telephone Encounter (Signed)
This is Dr. Hilty's pt 

## 2016-11-26 ENCOUNTER — Other Ambulatory Visit: Payer: Self-pay | Admitting: Internal Medicine

## 2016-12-31 ENCOUNTER — Other Ambulatory Visit: Payer: Self-pay | Admitting: Internal Medicine

## 2017-01-07 ENCOUNTER — Ambulatory Visit (INDEPENDENT_AMBULATORY_CARE_PROVIDER_SITE_OTHER): Payer: 59 | Admitting: Adult Health

## 2017-01-07 ENCOUNTER — Encounter: Payer: Self-pay | Admitting: Adult Health

## 2017-01-07 VITALS — BP 150/70 | Temp 99.3°F | Wt 191.0 lb

## 2017-01-07 DIAGNOSIS — H6690 Otitis media, unspecified, unspecified ear: Secondary | ICD-10-CM | POA: Diagnosis not present

## 2017-01-07 MED ORDER — AMOXICILLIN 500 MG PO CAPS: 500.0000 mg | ORAL_CAPSULE | Freq: Two times a day (BID) | ORAL | 0 refills | Status: AC

## 2017-01-07 NOTE — Progress Notes (Signed)
Subjective:    Patient ID: Tyler Hendrix, male    DOB: May 15, 1955, 61 y.o.   MRN: [id number]  Otalgia   There is pain in the left ear. This is a new problem. The current episode started in the past 7 days. The problem has been waxing and waning. Maximum temperature: subjective low grade  The fever has been present for 1 to 2 days. Associated symptoms include hearing loss and rhinorrhea. Pertinent negatives include no coughing, ear discharge or sore throat. He has tried acetaminophen and NSAIDs for the symptoms. The treatment provided mild relief. There is no history of a chronic ear infection.      Review of Systems  HENT: Positive for ear pain, hearing loss and rhinorrhea. Negative for ear discharge, postnasal drip, sinus pressure, sinus pain and sore throat.   Respiratory: Negative for cough.   Hematological: Negative for adenopathy.   Past Medical History:  Diagnosis Date  . Abnormal TSH   . CAD in native artery    a. 11/2015: s/p 2 vessel PCI of the first diagonal branch of the LAD in the mid left circumflex with DES.  Marland Kitchen Complete heart block (HCC)    a. s/p Medtronic ppm 10/2015.  Marland Kitchen Hodgkin disease (HCC)    a. s/p chest wall radiation.  . Pacemaker 10/2015   Medtronic  . PVC's (premature ventricular contractions)   . Right bundle branch block     Social History   Socioeconomic History  . Marital status: Married    Spouse name: Not on file  . Number of children: 2  . Years of education: Masters  . Highest education level: Not on file  Social Needs  . Financial resource strain: Not on file  . Food insecurity - worry: Not on file  . Food insecurity - inability: Not on file  . Transportation needs - medical: Not on file  . Transportation needs - non-medical: Not on file  Occupational History  . Occupation: Clinical research associate    Comment: [employer]  Tobacco Use  . Smoking status: Never Smoker  . Smokeless tobacco: Never Used  Substance and Sexual  Activity  . Alcohol use: Yes    Alcohol/week: 3.0 oz    Types: 5 Glasses of wine per week  . Drug use: No  . Sexual activity: Not on file  Other Topics Concern  . 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 CARDIAC CATHETERIZATION N/A 11/02/2015   Procedure: Left Heart Cath and Coronary Angiography;  Surgeon: Tonny Bollman, MD;  Location: [facility] INVASIVE CV LAB;  Service: Cardiovascular;  Laterality: N/A;  . CARDIAC CATHETERIZATION N/A 11/21/2015   Procedure: Coronary Stent Intervention;  Surgeon: Tonny Bollman, MD;  Location: [facility] INVASIVE CV LAB;  Service: Cardiovascular;  Laterality: N/A;  . EP IMPLANTABLE DEVICE N/A 11/02/2015   MDT Adivisa MRI conditional dual chamber pacemaker implanted by Dr Johney Frame for complete heart block  . KNEE SURGERY Right 1972  . LUMBAR LAMINECTOMY  1996    Family History  Problem Relation Age of Onset  . Cancer Unknown        prostate father hx  . Heart disease Mother   . Heart disease Father   . Colon cancer Neg Hx     No Known Allergies  Current Outpatient Medications on File Prior to Visit  Medication Sig Dispense Refill  . aspirin EC  81 MG tablet Take 1 tablet (81 mg total) by mouth daily. 30 tablet 0  . atorvastatin (LIPITOR) 80 MG tablet Take 1 tablet (80 mg total) by mouth daily at 6 PM. 30 tablet 9  . levothyroxine (SYNTHROID, LEVOTHROID) 75 MCG tablet TAKE 1 TABLET(75 MCG) BY MOUTH DAILY 60 tablet 0  . Multiple Vitamin (MULTIVITAMIN) tablet Take 1 tablet by mouth daily.    . NON FORMULARY Take 4 tablets by mouth daily.     Marland Kitchen olmesartan (BENICAR) 20 MG tablet Take 20 mg by mouth daily.    Marland Kitchen testosterone (ANDROGEL) 50 MG/5GM (1%) GEL APPLY CONTENTS OF 1 PACKET TOPICALLY EVERY DAY AS DIRECTED 150 g 0  . traZODone (DESYREL) 50 MG tablet TAKE 1 TABLET BY MOUTH AT BEDTIME 90 tablet 1  . nitroGLYCERIN (NITROSTAT) 0.4 MG SL tablet Place 1 tablet (0.4 mg total) under  the tongue every 5 (five) minutes as needed for chest pain (up to 3 doses). (Patient not taking: Reported on 01/07/2017) 25 tablet 3   No current facility-administered medications on file prior to visit.     BP (!) 150/70 (BP Location: Left Arm)   Temp 99.3 F (37.4 C) (Oral)   Wt 191 lb (86.6 kg)   BMI 26.64 kg/m       Objective:   Physical Exam  Constitutional: He is oriented to person, place, and time. He appears well-developed and well-nourished. No distress.  HENT:  Head: Normocephalic and atraumatic.  Right Ear: Hearing, external ear and ear canal normal. Tympanic membrane is erythematous. Tympanic membrane is not bulging.  Left Ear: Hearing, external ear and ear canal normal. Tympanic membrane is erythematous and bulging.  Nose: Nose normal. No mucosal edema or rhinorrhea. Right sinus exhibits no maxillary sinus tenderness and no frontal sinus tenderness. Left sinus exhibits no maxillary sinus tenderness and no frontal sinus tenderness.  Mouth/Throat: Uvula is midline and oropharynx is clear and moist. No oropharyngeal exudate.  Cardiovascular: Normal rate, regular rhythm, normal heart sounds and intact distal pulses. Exam reveals no gallop and no friction rub.  No murmur heard. Musculoskeletal: Normal range of motion. He exhibits no edema, tenderness or deformity.  Lymphadenopathy:       Head (right side): Tonsillar and preauricular adenopathy present.       Head (left side): Tonsillar and preauricular adenopathy present.  Neurological: He is alert and oriented to person, place, and time.  Skin: Skin is warm and dry. No rash noted. He is not diaphoretic. No erythema. No pallor.  Psychiatric: He has a normal mood and affect. His behavior is normal. Judgment and thought content normal.  Nursing note and vitals reviewed.     Assessment & Plan:  1. Acute otitis media, unspecified otitis media type - amoxicillin (AMOXIL) 500 MG capsule; Take 1 capsule (500 mg total) by mouth 2  (two) times daily for 10 days.  Dispense: 20 capsule; Refill: 0  Shirline Frees, NP

## 2017-01-23 ENCOUNTER — Other Ambulatory Visit: Payer: Self-pay | Admitting: *Deleted

## 2017-01-23 ENCOUNTER — Other Ambulatory Visit: Payer: Self-pay | Admitting: Internal Medicine

## 2017-01-23 MED ORDER — ATORVASTATIN CALCIUM 80 MG PO TABS: 80.0000 mg | ORAL_TABLET | Freq: Every day | ORAL | 4 refills | Status: DC

## 2017-01-23 NOTE — Telephone Encounter (Signed)
REFILL 

## 2017-01-26 ENCOUNTER — Other Ambulatory Visit: Payer: Self-pay | Admitting: Internal Medicine

## 2017-01-28 ENCOUNTER — Ambulatory Visit (INDEPENDENT_AMBULATORY_CARE_PROVIDER_SITE_OTHER): Payer: 59 | Admitting: *Deleted

## 2017-01-28 ENCOUNTER — Telehealth: Payer: Self-pay | Admitting: Cardiology

## 2017-01-28 DIAGNOSIS — I442 Atrioventricular block, complete: Secondary | ICD-10-CM

## 2017-01-28 NOTE — Telephone Encounter (Signed)
Spoke with pt and reminded pt of remote transmission that is due today. Pt verbalized understanding.   

## 2017-01-29 ENCOUNTER — Encounter: Payer: Self-pay | Admitting: Cardiology

## 2017-01-29 NOTE — Progress Notes (Signed)
Remote pacemaker transmission.   

## 2017-02-07 LAB — CUP PACEART REMOTE DEVICE CHECK
Battery Remaining Longevity: 98 mo
Battery Voltage: 3.01 V
Brady Statistic AS VS Percent: 0.11 %
Brady Statistic RA Percent Paced: 39.82 %
Date Time Interrogation Session: 20181219232147
Implantable Lead Implant Date: 20170922
Implantable Lead Implant Date: 20170922
Implantable Lead Location: 753860
Implantable Pulse Generator Implant Date: 20170922
Lead Channel Impedance Value: 475 Ohm
Lead Channel Impedance Value: 589 Ohm
Lead Channel Pacing Threshold Amplitude: 0.625 V
Lead Channel Pacing Threshold Amplitude: 0.875 V
Lead Channel Pacing Threshold Pulse Width: 0.4 ms
Lead Channel Pacing Threshold Pulse Width: 0.4 ms
Lead Channel Sensing Intrinsic Amplitude: 3.125 mV
Lead Channel Setting Sensing Sensitivity: 2 mV
MDC IDC LEAD LOCATION: 753859
MDC IDC MSMT LEADCHNL RA IMPEDANCE VALUE: 342 Ohm
MDC IDC MSMT LEADCHNL RA SENSING INTR AMPL: 3.125 mV
MDC IDC MSMT LEADCHNL RV IMPEDANCE VALUE: 437 Ohm
MDC IDC MSMT LEADCHNL RV SENSING INTR AMPL: 17.875 mV
MDC IDC MSMT LEADCHNL RV SENSING INTR AMPL: 17.875 mV
MDC IDC SET LEADCHNL RA PACING AMPLITUDE: 2 V
MDC IDC SET LEADCHNL RV PACING AMPLITUDE: 2.5 V
MDC IDC SET LEADCHNL RV PACING PULSEWIDTH: 0.4 ms
MDC IDC STAT BRADY AP VP PERCENT: 39.83 %
MDC IDC STAT BRADY AP VS PERCENT: 0.02 %
MDC IDC STAT BRADY AS VP PERCENT: 60.05 %
MDC IDC STAT BRADY RV PERCENT PACED: 99.82 %

## 2017-02-18 NOTE — Progress Notes (Signed)
Electrophysiology Office Note Date: 02/20/2017  ID:  Tyler Hendrix, DOB 15-Dec-1955, MRN [id number]  PCP: Gordy Savers, MD Primary Cardiologist: [facility] Electrophysiologist: [facility]  CC: Pacemaker and CAD follow up  Tyler Hendrix is a 62 y.o. male seen today for Dr Johney Frame.  He presents today for routine electrophysiology followup.  Since last being seen in clinic, the patient reports doing very well.  He denies chest pain, palpitations, dyspnea, PND, orthopnea, nausea, vomiting, dizziness, syncope, edema, weight gain, or early satiety.  Device History: MDT dual chamber PPM implanted 2017 for complete heart block    Past Medical History:  Diagnosis Date  . Abnormal TSH   . CAD in native artery    a. 11/2015: s/p 2 vessel PCI of the first diagonal branch of the LAD in the mid left circumflex with DES.  Marland Kitchen Complete heart block (HCC)    a. s/p Medtronic ppm 10/2015.  Marland Kitchen Hodgkin disease (HCC)    a. s/p chest wall radiation.  . Pacemaker 10/2015   Medtronic  . PVC's (premature ventricular contractions)   . Right bundle branch block    Past Surgical History:  Procedure Laterality Date  . CARDIAC CATHETERIZATION N/A 11/02/2015   Procedure: Left Heart Cath and Coronary Angiography;  Surgeon: Tonny Bollman, MD;  Location: [facility] INVASIVE CV LAB;  Service: Cardiovascular;  Laterality: N/A;  . CARDIAC CATHETERIZATION N/A 11/21/2015   Procedure: Coronary Stent Intervention;  Surgeon: Tonny Bollman, MD;  Location: [facility] INVASIVE CV LAB;  Service: Cardiovascular;  Laterality: N/A;  . EP IMPLANTABLE DEVICE N/A 11/02/2015   MDT Adivisa MRI conditional dual chamber pacemaker implanted by Dr Johney Frame for complete heart block  . KNEE SURGERY Right 1972  . LUMBAR LAMINECTOMY  1996    Current Outpatient Medications  Medication Sig Dispense Refill  . aspirin EC 81 MG tablet Take 1 tablet (81 mg total) by mouth daily. 30 tablet 0  . atorvastatin (LIPITOR) 80 MG tablet Take 1 tablet (80 mg  total) by mouth daily at 6 PM. 30 tablet 4  . levothyroxine (SYNTHROID, LEVOTHROID) 75 MCG tablet TAKE 1 TABLET(75 MCG) BY MOUTH DAILY 90 tablet 0  . Multiple Vitamin (MULTIVITAMIN) tablet Take 1 tablet by mouth daily.    . nitroGLYCERIN (NITROSTAT) 0.4 MG SL tablet Place 1 tablet (0.4 mg total) under the tongue every 5 (five) minutes as needed for chest pain (up to 3 doses). 25 tablet 3  . NON FORMULARY Take 4 tablets by mouth daily.     Marland Kitchen olmesartan (BENICAR) 20 MG tablet Take 20 mg by mouth daily.    Marland Kitchen testosterone (ANDROGEL) 50 MG/5GM (1%) GEL APPLY CONTENTS OF 1 PACKET TOPICALLY EVERY DAY AS DIRECTED 150 g 0  . traZODone (DESYREL) 50 MG tablet TAKE 1 TABLET BY MOUTH EVERY NIGHT AT BEDTIME. 90 tablet 0   No current facility-administered medications for this visit.     Allergies:   Patient has no known allergies.   Social History: Social History   Socioeconomic History  . Marital status: Married    Spouse name: Not on file  . Number of children: 2  . Years of education: Masters  . Highest education level: Not on file  Social Needs  . Financial resource strain: Not on file  . Food insecurity - worry: Not on file  . Food insecurity - inability: Not on file  . Transportation needs - medical: Not on file  . Transportation needs - non-medical: Not on file  Occupational History  .  Occupation: Clinical research associate    Comment: [employer]  Tobacco Use  . Smoking status: Never Smoker  . Smokeless tobacco: Never Used  Substance and Sexual Activity  . Alcohol use: Yes    Alcohol/week: 3.0 oz    Types: 5 Glasses of wine per week  . Drug use: No  . Sexual activity: Not on file  Other Topics Concern  . 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 Cancer Unknown        prostate father hx  . Heart disease Mother   . Heart disease Father   . Colon cancer Neg Hx       Review of Systems: All other systems reviewed and are otherwise negative except as noted above.   Physical Exam: GEN- The patient is well appearing, alert and oriented x 3 today.   HEENT: normocephalic, atraumatic; sclera clear, conjunctiva pink; hearing intact; oropharynx clear; neck supple  Lungs-normal work of breathing Heart- Regular rate and rhythm (paced) GI- soft, non-tender, non-distended, bowel sounds present  Extremities- no clubbing, cyanosis, or edema  MS- no significant deformity or atrophy Skin- warm and dry, no rash or lesion; PPM pocket well healed Psych- euthymic mood, full affect Neuro- strength and sensation are intact  PPM Interrogation- reviewed in detail today,  See PACEART report  EKG:  EKG is not ordered today.  Recent Labs: 09/11/2016: TSH 4.10   Wt Readings from Last 3 Encounters:  02/20/17 188 lb 12.8 oz (85.6 kg)  01/07/17 191 lb (86.6 kg)  10/21/16 191 lb (86.6 kg)     Other studies Reviewed: Additional studies/ records that were reviewed today include: office notes  Assessment and Plan:  1.  Complete heart block Normal PPM function See Pace Art report No changes today  2.  CAD No recent ischemic symptoms Stable No change required today  Current medicines are reviewed at length with the patient today.   The patient does not have concerns regarding his medicines.  The following changes were made today:  none  Labs/ tests ordered today include:  No orders of the defined types were placed in this encounter.    Disposition:   Follow up with [facility], Dr Johney Frame 1 year   Signed, Gypsy Balsam, NP 02/20/2017 2:48 PM  [facility] [address] [place] [address] [phone] (office) [phone] (fax)

## 2017-02-20 ENCOUNTER — Ambulatory Visit (INDEPENDENT_AMBULATORY_CARE_PROVIDER_SITE_OTHER): Payer: 59 | Admitting: Nurse Practitioner

## 2017-02-20 ENCOUNTER — Encounter: Payer: Self-pay | Admitting: Nurse Practitioner

## 2017-02-20 VITALS — BP 150/70 | HR 68 | Ht 71.0 in | Wt 188.8 lb

## 2017-02-20 DIAGNOSIS — I442 Atrioventricular block, complete: Secondary | ICD-10-CM

## 2017-02-20 DIAGNOSIS — I2510 Atherosclerotic heart disease of native coronary artery without angina pectoris: Secondary | ICD-10-CM

## 2017-02-20 NOTE — Patient Instructions (Addendum)
Medication Instructions:   Your physician recommends that you continue on your current medications as directed. Please refer to the Current Medication list given to you today.   If you need a refill on your cardiac medications before your next appointment, please call your pharmacy.  Labwork: NONE ORDERED  TODAY    Testing/Procedures: NONE ORDERED  TODAY    Follow-Up:   Your physician wants you to follow-up in: [place] will receive a reminder letter in the mail two months in advance. If you don't receive a letter, please call our office to schedule the follow-up appointment.  Remote monitoring is used to monitor your Pacemaker of ICD from home. This monitoring reduces the number of office visits required to check your device to one time per year. It allows [place] to keep an eye on the functioning of your device to ensure it is working properly. You are scheduled for a device check from home on .Marland Kitchen3-20-19 You may send your transmission at any time that day. If you have a wireless device, the transmission will be sent automatically. After your physician reviews your transmission, you will receive a postcard with your next transmission date.     Any Other Special Instructions Will Be Listed Below (If Applicable).

## 2017-03-04 LAB — CUP PACEART INCLINIC DEVICE CHECK
Date Time Interrogation Session: 20190123085416
Implantable Lead Location: 753860
Implantable Lead Model: 5076
Implantable Lead Model: 5076
Implantable Pulse Generator Implant Date: 20170922
MDC IDC LEAD IMPLANT DT: 20170922
MDC IDC LEAD IMPLANT DT: 20170922
MDC IDC LEAD LOCATION: 753859

## 2017-03-27 ENCOUNTER — Other Ambulatory Visit: Payer: Self-pay | Admitting: Internal Medicine

## 2017-03-31 NOTE — Telephone Encounter (Signed)
Last OV: 10/21/16 Last filled: 10/21/16, 150g, 0 RF Sig: APPLY CONTENTS OF 1 PACKET TOPICALLY EVERY DAY AS DIRECTED  Lab Results  Component Value Date   TESTOSTERONE 227.84 (L) 05/27/2010

## 2017-03-31 NOTE — Telephone Encounter (Signed)
Okay for refill?  

## 2017-03-31 NOTE — Telephone Encounter (Signed)
Rx/forms faxed. Fax confirmation received.  

## 2017-03-31 NOTE — Telephone Encounter (Signed)
Rx printed, to PCP for signature. 

## 2017-04-03 ENCOUNTER — Other Ambulatory Visit: Payer: Self-pay

## 2017-04-03 ENCOUNTER — Other Ambulatory Visit: Payer: Self-pay | Admitting: *Deleted

## 2017-04-03 MED ORDER — ATORVASTATIN CALCIUM 80 MG PO TABS: 80.0000 mg | ORAL_TABLET | Freq: Every day | ORAL | 3 refills | Status: DC

## 2017-04-03 MED ORDER — LEVOTHYROXINE SODIUM 75 MCG PO TABS: ORAL_TABLET | ORAL | 0 refills | Status: DC

## 2017-04-03 MED ORDER — OLMESARTAN MEDOXOMIL 20 MG PO TABS: 20.0000 mg | ORAL_TABLET | Freq: Every day | ORAL | 3 refills | Status: DC

## 2017-04-24 ENCOUNTER — Encounter: Payer: Self-pay | Admitting: Internal Medicine

## 2017-04-24 ENCOUNTER — Ambulatory Visit (INDEPENDENT_AMBULATORY_CARE_PROVIDER_SITE_OTHER): Payer: 59 | Admitting: Internal Medicine

## 2017-04-24 ENCOUNTER — Other Ambulatory Visit: Payer: Self-pay | Admitting: Internal Medicine

## 2017-04-24 VITALS — BP 142/68 | HR 50 | Ht 71.0 in | Wt 191.0 lb

## 2017-04-24 DIAGNOSIS — Z950 Presence of cardiac pacemaker: Secondary | ICD-10-CM | POA: Diagnosis not present

## 2017-04-24 DIAGNOSIS — E785 Hyperlipidemia, unspecified: Secondary | ICD-10-CM | POA: Diagnosis not present

## 2017-04-24 DIAGNOSIS — I442 Atrioventricular block, complete: Secondary | ICD-10-CM | POA: Diagnosis not present

## 2017-04-24 DIAGNOSIS — I2510 Atherosclerotic heart disease of native coronary artery without angina pectoris: Secondary | ICD-10-CM | POA: Diagnosis not present

## 2017-04-24 NOTE — Progress Notes (Signed)
OFFICE NOTE  Chief Complaint:  No complaint  Primary Care Physician: Marletta Lor, MD  HPI:  Tyler Hendrix is a pleasant 62 year old male who was previously seen by Dr. Acie Fredrickson in September of this past year. He is an add on to my schedule today for acute symptoms of palpitations. He reports over the past several days she's had skipped heartbeats and higher heart rates. He says his heart rate is typically in the 60s however recently has been in the 80s. He's also felt some skipped beats and they were happening 4-5 times a minute. He denies any chest pain or worsening shortness of breath. He's never had a stress test or any coronary disease evaluation. He does have a history of Hodgkin's lymphoma and had chest wall radiation. In September he had an echocardiogram which showed normal systolic function and it was felt that he was doing fairly well. He continued to do exercise which she has recently without any significant symptoms. 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   [name] returns today for follow-up. Overall he is feeling fairly well. He reports his palpitations have improved somewhat but he still feeling him. While wearing the monitor he was noted to have significant ventricular ectopy including isolated PVCs, and ventricular trigeminy and quadrigeminy. His nuclear stress test was negative for ischemia but was not gated. He did have an echo last fall which showed an EF of 55% which is reassuring.   At the pleasure see [name] back in the office today for follow-up. Overall he thinks he is doing much better on the high-dose of metoprolol 25 mg daily. Although he's tolerating this with very few palpitations, he still has some breakthrough in the afternoon. He generally takes the medicine in the morning. His EKG today however does show a low heart rate of 48, with a  short period to sinus rhythm and a competing junctional rhythm. I've had concerns about underlying conduction system disease based on his EKG showing a right bundle branch block and now there is some evidence of a junctional bradycardia. Despite this he has a good energy level, denies chest pain and is able to maintain normal physical activity. His wife recently had their new baby about 2 weeks ago.  06/22/2015  [name] returns today for follow-up. He reports that he is overall done fairly well. He denies any worsening shortness of breath or fatigue. Heart rate remains in the upper 40s although he has very little ventricular ectopy and his EKG today. He denies any significant palpitations. I think we found a balance currently between low-dose beta blocker and his underlying conduction disease. Should he have more symptoms however he may need antiarrhythmic medication. I do not see any features that are concerning for pacemaker at this point. Blood pressure, however remains elevated and not at goal.  10/04/2015  [name] was seen back today in follow-up. He reports a marked improvement in his PVCs however over the past week he's felt slightly more short of breath with exertion. He did not voluntarily mention this yet required significant persistent questioning on my part to get an answer out of him. He also said that he might have a little pressure in his chest when he tried to exercise. He denied any pain, dizziness, presyncope or syncopal symptoms. Blood pressure is improved somewhat after the  addition of amlodipine. He's no longer having any PVCs however his EKG is abnormal today showing a new complete heart block. Heart rate is 44 with a sinus rhythm and wide QRS. There are inferior T-wave abnormalities which were previously seen on his EKG.  10/05/2015  [name] was seen today in follow-up of complete heart block. This was noted yesterday during his office visit. Heart rate was in the 40s and he  was somewhat diaphoretic. He denied any chest pain or shortness of breath but has felt fatigue recently. He had taken Toprol XL the night before and I advised him to not take any last evening and allow the medicine to wash out of his system. A today he is a heart rate has improved up to 88 and is in sinus rhythm with first-degree AV block and bifascicular block. He thinks he actually feels a little better today although was not diaphoretic. He said he didn't sleep well last night and had some diarrhea this morning. He does not noted change in energy level although he feels that he's had some recurrent palpitations.  01/01/2016  [name] returns today for follow-up. He underwent placement of a pacemaker which was uneventful for complete heart block. He says since that time his color is improved energy is better. Heart rate today is 61 however the pacer is set at a backup rate of 50. He was concerned that this may be still causing a little fatigue but I reassured him that I think that his heart rate is over that number and unlikely to be the cause. Blood pressure is well-controlled. The pacemaker site looks appropriate without any hematoma or effusion. He was recently seen by Almyra Deforest, PA-C , and was noted to have an elevated TSH. This is been elevated in the past but was as high as 9. Free T4 was ordered and those labs are pending. He did have radiation to his thyroid as part of treatment for Hodgkin's lymphoma.  04/18/2015  [name] was seen today in follow-up. Overall he seems to be doing very well. After discharge his amlodipine was increased to 10 mg from 5 mg. I rechecked his blood pressure today was 104/48, and he has noted that occasionally he gets a flushed feeling when exercising. I suspect he may be on too much medicine. We did discuss that amlodipine although does have a benefit for controlling blood pressure, has no intrinsic cardiovascular benefit. He does have normal renal function would  probably benefit from being on an ACE or ARB. He denies any chest pain or worsening shortness of breath. No problems with his pacemaker. He has been seeing an endocrinologist and his thyroid function has normalized on low-dose levothyroxine.  10/17/2016  Mr. Braun returns today for follow-up. He continues to do well. Recently he had his valsartan and switch to [place] losartan. He was feeling a little fatigue any decrease the dose from 20 mg to 10 mg daily. Blood pressure he said was running low for him. Today seems to be normal at 112/68. He is going to see his primary care provider about retesting of his testosterone. Apparently in the past this is been low as a result of chemotherapy and radiation. 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 He denies any chest pain. He's had no presyncope or syncopal symptoms. His pacemaker is been functioning appropriately on remote checks. This now almost one year since his two-vessel PCI.  04/24/2017  Mr. Flight returns today for follow-up.  Overall he is doing well.  He denies any chest pain or worsening shortness of breath.  Blood pressure was elevated today however came down to 128/56.  He remains a heart rate of 50 which is his backup pacer setting.  He is in complete heart block and testing is indicated that he is pacemaker dependent.  He does do remote checks.  He had two-vessel coronary artery disease and has been asymptomatic with that.  He is on aspirin and high-dose atorvastatin.  He has a physical exam coming up in a few weeks with his primary care provider who will get cholesterol testing at that time.  He is also on olmesartan for blood pressure.  PMHx:  Past Medical History:  Diagnosis Date  . Abnormal TSH   . CAD in native artery    a. 11/2015: s/p 2 vessel PCI of the first diagonal branch of the LAD in the mid left circumflex with DES.  Marland Kitchen Complete heart block (HCC)      a. s/p Medtronic ppm 10/2015.  Marland Kitchen Hodgkin disease ([place])    a. s/p chest wall radiation.  . Pacemaker 10/2015   Medtronic  . PVC's (premature ventricular contractions)   . Right bundle branch block     Past Surgical History:  Procedure Laterality Date  . CARDIAC CATHETERIZATION N/A 11/02/2015   Procedure: Left Heart Cath and Coronary Angiography;  Surgeon: Sherren Mocha, MD;  Location: [facility];  Service: Cardiovascular;  Laterality: N/A;  . CARDIAC CATHETERIZATION N/A 11/21/2015   Procedure: Coronary Stent Intervention;  Surgeon: Sherren Mocha, MD;  Location: [facility];  Service: Cardiovascular;  Laterality: N/A;  . EP IMPLANTABLE DEVICE N/A 11/02/2015   MDT Adivisa MRI conditional dual chamber pacemaker implanted by Dr Rayann Heman for complete heart block  . KNEE SURGERY Right 1972  . LUMBAR LAMINECTOMY  1996    FAMHx:  Family History  Problem Relation Age of Onset  . Cancer Unknown        prostate father hx  . Heart disease Mother   . Heart disease Father   . Colon cancer Neg Hx     SOCHx:   reports that  has never smoked. he has never used smokeless tobacco. He reports that he drinks about 3.0 oz of alcohol per week. He reports that he does not use drugs.  ALLERGIES:  No Known Allergies  ROS: Pertinent items noted in HPI and remainder of comprehensive ROS otherwise negative.  HOME MEDS: Current Outpatient Medications  Medication Sig Dispense Refill  . aspirin EC 81 MG tablet Take 1 tablet (81 mg total) by mouth daily. 30 tablet 0  . atorvastatin (LIPITOR) 80 MG tablet Take 1 tablet (80 mg total) by mouth daily at 6 PM. 90 tablet 3  . levothyroxine (SYNTHROID, LEVOTHROID) 75 MCG tablet TAKE 1 TABLET(75 MCG) BY MOUTH DAILY 90 tablet 0  . Multiple Vitamin (MULTIVITAMIN) tablet Take 1 tablet by mouth daily.    . nitroGLYCERIN (NITROSTAT) 0.4 MG SL tablet Place 1 tablet (0.4 mg total) under the tongue every 5 (five) minutes as needed for chest pain (up to 3  doses). 25 tablet 3  . NON FORMULARY Take 4 tablets by mouth daily.     Marland Kitchen olmesartan (BENICAR) 20 MG tablet Take 1 tablet (20 mg total) by mouth daily. 90 tablet 3  . traZODone (DESYREL) 50 MG tablet TAKE 1 TABLET BY MOUTH EVERY NIGHT AT BEDTIME. 90 tablet  0   No current facility-administered medications for this visit.     LABS/IMAGING: No results found for this or any previous visit (from the past 48 hour(s)). No results found.  WEIGHTS: Wt Readings from Last 3 Encounters:  04/24/17 191 lb (86.6 kg)  02/20/17 188 lb 12.8 oz (85.6 kg)  01/07/17 191 lb (86.6 kg)    VITALS: BP (!) 142/68   Pulse (!) 50   Ht 5\' 11"  (1.803 m)   Wt 191 lb (86.6 kg)   BMI 26.64 kg/m   EXAM: General appearance: alert and no distress Neck: no carotid bruit, no JVD and thyroid not enlarged, symmetric, no tenderness/mass/nodules Lungs: clear to auscultation bilaterally Heart: regular rate and rhythm, S1, S2 normal, no murmur, click, rub or gallop Abdomen: soft, non-tender; bowel sounds normal; no masses,  no organomegaly Extremities: extremities normal, atraumatic, no cyanosis or edema Pulses: 2+ and symmetric Skin: Skin color, texture, turgor normal. No rashes or lesions Neurologic: Grossly normal Psych: Pleasant  EKG: AV dual paced rhythm at 50-personally reviewed  ASSESSMENT: 1. CAD - s/p PCI to the D1 and mid LCX with DES (11/2015) 2. Complete heart block-s/p Medtronic pacemaker (10/2015) 3. Palpitations - PVCs including trigeminy and quadrigeminy, generally improved on b-blocker 4. 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 History of chest wall radiation for Hodgkin's lymphoma 6. Essential hypertension 7. Hypothyroidism  PLAN: 1.   Mr. Going continues to do well.  He denies any chest pain or worsening shortness of breath.  Unfortunately he is pacemaker dependent.  He is at a rate of 50.  His palpitations are not bothersome.  He is concerned about  whether he could develop heart failure, which is certainly possible however I think of low likelihood.  He should continue on his current medications.  He has an upcoming appointment with his primary care provider and I will follow-up on his lipid profile at that time.  Follow-up in 6 months.  Pixie Casino, MD, [facility], [place] Director of the [facility]  Card[facility]ovascular [facility] Diplomate of the American Board of Clinical Lipidology Attending Cardiologist  Direct Dial: [phone]  Fax: [phone]  Website:  [email or web address] 04/24/2017, 3:45 PM

## 2017-04-24 NOTE — Patient Instructions (Signed)
Your physician wants you to follow-up in: 6 months with Dr. Hilty. You will receive a reminder letter in the mail two months in advance. If you don't receive a letter, please call our office to schedule the follow-up appointment.    

## 2017-04-29 ENCOUNTER — Ambulatory Visit (INDEPENDENT_AMBULATORY_CARE_PROVIDER_SITE_OTHER): Payer: 59 | Admitting: *Deleted

## 2017-04-29 ENCOUNTER — Telehealth: Payer: Self-pay | Admitting: Cardiology

## 2017-04-29 DIAGNOSIS — I442 Atrioventricular block, complete: Secondary | ICD-10-CM | POA: Diagnosis not present

## 2017-04-29 NOTE — Telephone Encounter (Signed)
Spoke with pt and reminded pt of remote transmission that is due today. Pt verbalized understanding.   

## 2017-04-30 ENCOUNTER — Encounter: Payer: Self-pay | Admitting: Cardiology

## 2017-04-30 NOTE — Progress Notes (Signed)
Remote pacemaker transmission.   

## 2017-05-01 DIAGNOSIS — C44612 Basal cell carcinoma of skin of right upper limb, including shoulder: Secondary | ICD-10-CM | POA: Diagnosis not present

## 2017-05-01 DIAGNOSIS — C44719 Basal cell carcinoma of skin of left lower limb, including hip: Secondary | ICD-10-CM | POA: Diagnosis not present

## 2017-05-01 DIAGNOSIS — L570 Actinic keratosis: Secondary | ICD-10-CM | POA: Diagnosis not present

## 2017-05-01 DIAGNOSIS — X32XXXD Exposure to sunlight, subsequent encounter: Secondary | ICD-10-CM | POA: Diagnosis not present

## 2017-05-12 ENCOUNTER — Ambulatory Visit (INDEPENDENT_AMBULATORY_CARE_PROVIDER_SITE_OTHER): Payer: 59 | Admitting: Internal Medicine

## 2017-05-12 ENCOUNTER — Encounter: Payer: Self-pay | Admitting: Internal Medicine

## 2017-05-12 VITALS — BP 140/78 | HR 51 | Temp 98.4°F | Ht 71.0 in | Wt 185.0 lb

## 2017-05-12 DIAGNOSIS — E7800 Pure hypercholesterolemia, unspecified: Secondary | ICD-10-CM

## 2017-05-12 DIAGNOSIS — E349 Endocrine disorder, unspecified: Secondary | ICD-10-CM | POA: Diagnosis not present

## 2017-05-12 DIAGNOSIS — I10 Essential (primary) hypertension: Secondary | ICD-10-CM | POA: Diagnosis not present

## 2017-05-12 DIAGNOSIS — Z0000 Encounter for general adult medical examination without abnormal findings: Secondary | ICD-10-CM | POA: Diagnosis not present

## 2017-05-12 DIAGNOSIS — E039 Hypothyroidism, unspecified: Secondary | ICD-10-CM

## 2017-05-12 LAB — COMPREHENSIVE METABOLIC PANEL
ALT: 23 U/L (ref 0–53)
AST: 23 U/L (ref 0–37)
Albumin: 4.4 g/dL (ref 3.5–5.2)
Alkaline Phosphatase: 62 U/L (ref 39–117)
BUN: 14 mg/dL (ref 6–23)
CHLORIDE: 102 meq/L (ref 96–112)
CO2: 30 meq/L (ref 19–32)
CREATININE: 0.94 mg/dL (ref 0.40–1.50)
Calcium: 9.5 mg/dL (ref 8.4–10.5)
GFR: 86.7 mL/min (ref >60.00)
Glucose, Bld: 103 mg/dL — ABNORMAL HIGH (ref 70–99)
POTASSIUM: 4.6 meq/L (ref 3.5–5.1)
SODIUM: 138 meq/L (ref 135–145)
Total Bilirubin: 1.4 mg/dL — ABNORMAL HIGH (ref 0.2–1.2)
Total Protein: 7 g/dL (ref 6.0–8.3)

## 2017-05-12 LAB — CBC WITH DIFFERENTIAL/PLATELET
Basophils Absolute: 0 10*3/uL (ref 0.0–0.1)
Basophils Relative: 0.4 % (ref 0.0–3.0)
EOS ABS: 0.1 10*3/uL (ref 0.0–0.7)
EOS PCT: 0.6 % (ref 0.0–5.0)
HEMATOCRIT: 43.1 % (ref 39.0–52.0)
Hemoglobin: 14.8 g/dL (ref 13.0–17.0)
Lymphocytes Relative: 7.5 % — ABNORMAL LOW (ref 12.0–46.0)
Lymphs Abs: 0.7 10*3/uL (ref 0.7–4.0)
MCHC: 34.3 g/dL (ref 30.0–36.0)
MCV: 91.4 fl (ref 78.0–100.0)
MONO ABS: 0.4 10*3/uL (ref 0.1–1.0)
Monocytes Relative: 4.2 % (ref 3.0–12.0)
NEUTROS ABS: 8.1 10*3/uL — AB (ref 1.4–7.7)
Neutrophils Relative %: 87.3 % — ABNORMAL HIGH (ref 43.0–77.0)
PLATELETS: 275 10*3/uL (ref 150.0–400.0)
RBC: 4.72 Mil/uL (ref 4.22–5.81)
RDW: 13.8 % (ref 11.5–15.5)
WBC: 9.3 10*3/uL (ref 4.0–10.5)

## 2017-05-12 LAB — LIPID PANEL
CHOL/HDL RATIO: 3
Cholesterol: 93 mg/dL (ref 0–200)
HDL: 35.2 mg/dL — ABNORMAL LOW (ref >39.00)
LDL Cholesterol: 42 mg/dL (ref 0–99)
NONHDL: 58.11
Triglycerides: 83 mg/dL (ref 0.0–149.0)
VLDL: 16.6 mg/dL (ref 0.0–40.0)

## 2017-05-12 LAB — T4, FREE: Free T4: 0.96 ng/dL (ref 0.60–1.60)

## 2017-05-12 LAB — TSH: TSH: 2.5 u[IU]/mL (ref 0.35–4.50)

## 2017-05-12 LAB — PSA: PSA: 2.27 ng/mL (ref 0.10–4.00)

## 2017-05-12 LAB — TESTOSTERONE: TESTOSTERONE: 315.17 ng/dL (ref 300.00–890.00)

## 2017-05-12 MED ORDER — TRAZODONE HCL 50 MG PO TABS: 50.0000 mg | ORAL_TABLET | Freq: Every day | ORAL | 3 refills | Status: DC

## 2017-05-12 MED ORDER — CEPHALEXIN 500 MG PO CAPS: 500.0000 mg | ORAL_CAPSULE | Freq: Three times a day (TID) | ORAL | 0 refills | Status: DC

## 2017-05-12 MED ORDER — MUPIROCIN 2 % EX OINT
TOPICAL_OINTMENT | CUTANEOUS | 0 refills | Status: DC
Start: 2017-05-12 — End: 2017-11-19

## 2017-05-12 MED ORDER — TESTOSTERONE 20.25 MG/ACT (1.62%) TD GEL: 1.0000 "application " | TRANSDERMAL | 4 refills | Status: DC

## 2017-05-12 NOTE — Progress Notes (Signed)
Subjective:    Patient ID: Tyler Hendrix, male    DOB: [birth year], 62 y.o.   MRN: [id number]  HPI 62 year old patient who is seen today for a preventive health examination. He is followed closely by cardiology with coronary artery disease status post stenting and pacemaker insertion He has been seen recently by dermatology. He has a history of hypothyroidism dyslipidemia. Doing quite well today without major concerns or complaints.  Family history.  Father died at 68 of congestive heart failure.  History of bladder cancer mother died at 63 also of congestive heart failure.  One brother one sister positive for hypertension  Past Medical History:  Diagnosis Date  . Abnormal TSH   . CAD in native artery    a. 11/2015: s/p 2 vessel PCI of the first diagonal branch of the LAD in the mid left circumflex with DES.  Marland Kitchen Complete heart block (HCC)    a. s/p Medtronic ppm 10/2015.  Marland Kitchen Hodgkin disease (HCC)    a. s/p chest wall radiation.  . Pacemaker 10/2015   Medtronic  . PVC's (premature ventricular contractions)   . Right bundle branch block      Social History   Socioeconomic History  . Marital status: Married    Spouse name: Not on file  . Number of children: 2  . Years of education: Masters  . Highest education level: Not on file  Occupational History  . Occupation: Clinical research associate    Comment: [employer]  Social Needs  . Financial resource strain: Not on file  . Food insecurity:    Worry: Not on file    Inability: Not on file  . Transportation needs:    Medical: Not on file    Non-medical: Not on file  Tobacco Use  . Smoking status: Never Smoker  . Smokeless tobacco: Never Used  Substance and Sexual Activity  . Alcohol use: Yes    Alcohol/week: 3.0 oz    Types: 5 Glasses of wine per week  . Drug use: No  . Sexual activity: Not on file  Lifestyle  . Physical activity:    Days per week: Not on file    Minutes per session: Not on file  . Stress:  Not on file  Relationships  . Social connections:    Talks on phone: Not on file    Gets together: Not on file    Attends religious service: Not on file    Active member of club or organization: Not on file    Attends meetings of clubs or organizations: Not on file    Relationship status: Not on file  . Intimate partner violence:    Fear of current or ex partner: Not on file    Emotionally abused: Not on file    Physically abused: Not on file    Forced sexual activity: Not on file  Other Topics Concern  . 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 CARDIAC CATHETERIZATION N/A 11/02/2015   Procedure: Left Heart Cath and Coronary Angiography;  Surgeon: Tonny Bollman, MD;  Location[facility];  Service: Cardiovascular;  Laterality: N/A;  . CARDIAC CATHETERIZATION N/A 11/21/2015   Procedure: Coronary Stent Intervention;  Surgeon: Tonny Bollman, MD;  Location: [facility] INVASIVE [facility];  Service: Cardiovascular;  Laterality: N/A;  . EP IMPLANTABLE DEVICE N/A 11/02/2015   MDT Adivisa MRI conditional  dual chamber pacemaker implanted by Dr Johney Frame for complete heart block  . KNEE SURGERY Right 1972  . LUMBAR LAMINECTOMY  1996    Family History  Problem Relation Age of Onset  . Cancer Unknown        prostate father hx  . Heart disease Mother   . Heart disease Father   . Colon cancer Neg Hx     No Known Allergies  Current Outpatient Medications on File Prior to Visit  Medication Sig Dispense Refill  . aspirin EC 81 MG tablet Take 1 tablet (81 mg total) by mouth daily. 30 tablet 0  . atorvastatin (LIPITOR) 80 MG tablet Take 1 tablet (80 mg total) by mouth daily at 6 PM. 90 tablet 3  . levothyroxine (SYNTHROID, LEVOTHROID) 75 MCG tablet TAKE 1 TABLET BY MOUTH EVERY DAY 90 tablet 0  . Multiple Vitamin (MULTIVITAMIN) tablet Take 1 tablet by mouth daily.    . nitroGLYCERIN (NITROSTAT) 0.4 MG SL tablet  Place 1 tablet (0.4 mg total) under the tongue every 5 (five) minutes as needed for chest pain (up to 3 doses). 25 tablet 3  . NON FORMULARY Take 4 tablets by mouth daily.     Marland Kitchen olmesartan (BENICAR) 20 MG tablet Take 1 tablet (20 mg total) by mouth daily. 90 tablet 3   No current facility-administered medications on file prior to visit.     BP 140/78 (BP Location: Left Arm, Patient Position: Sitting, Cuff Size: Large)   Pulse (!) 51   Temp 98.4 F (36.9 C) (Oral)   Ht 5\' 11"  (1.803 m)   Wt 185 lb (83.9 kg)   SpO2 99%   BMI 25.80 kg/m      Review of Systems  Constitutional: Negative for appetite change, chills, fatigue and fever.  HENT: Negative for congestion, dental problem, ear pain, hearing loss, sore throat, tinnitus, trouble swallowing and voice change.   Eyes: Negative for pain, discharge and visual disturbance.  Respiratory: Negative for cough, chest tightness, wheezing and stridor.   Cardiovascular: Negative for chest pain, palpitations and leg swelling.  Gastrointestinal: Negative for abdominal distention, abdominal pain, blood in stool, constipation, diarrhea, nausea and vomiting.  Genitourinary: Negative for difficulty urinating, discharge, flank pain, genital sores, hematuria and urgency.  Musculoskeletal: Negative for arthralgias, back pain, gait problem, joint swelling, myalgias and neck stiffness.  Skin: Negative for rash.  Neurological: Negative for dizziness, syncope, speech difficulty, weakness, numbness and headaches.  Hematological: Negative for adenopathy. Does not bruise/bleed easily.  Psychiatric/Behavioral: Negative for behavioral problems and dysphoric mood. The patient is not nervous/anxious.        Objective:   Physical Exam  Constitutional: He appears well-developed and well-nourished.  HENT:  Head: Normocephalic and atraumatic.  Right Ear: External ear normal.  Left Ear: External ear normal.  Nose: Nose normal.  Mouth/Throat: Oropharynx is  clear and moist.  Eyes: Pupils are equal, round, and reactive to light. Conjunctivae and EOM are normal. No scleral icterus.  Neck: Normal range of motion. Neck supple. No JVD present. No thyromegaly present.  Cardiovascular: Regular rhythm, normal heart sounds and intact distal pulses. Exam reveals no gallop and no friction rub.  No murmur heard. Decreased right dorsalis pedis pulse Faint right supraclavicular bruit  Pulmonary/Chest: Effort normal and breath sounds normal. He exhibits no tenderness.  Abdominal: Soft. Bowel sounds are normal. He exhibits no distension and no mass. There is no tenderness.  Genitourinary: Penis normal.  Genitourinary Comments: Prostate symmetrically enlarged  Musculoskeletal: Normal range  of motion. He exhibits no edema or tenderness.  Lymphadenopathy:    He has no cervical adenopathy.  Neurological: He is alert. He has normal reflexes. No cranial nerve deficit. Coordination normal.  Skin: Skin is warm and dry. No rash noted.   quarter sized lesion left inner calf from recent biopsy with slight surrounding rim of erythema  Psychiatric: He has a normal mood and affect. His behavior is normal.          Assessment & Plan:   Preventive health examination Coronary artery disease.  Follow-up cardiology continue efforts at aggressive risk factor modification Status post complete heart block Essential hypertension Hypothyroidism.  Will check thyroid indices Dyslipidemia check lipid profile  Return here in 1 year or as needed  NIKE

## 2017-05-12 NOTE — Patient Instructions (Addendum)
Cardiology follow-up as scheduled   Heart-Healthy Eating Plan Many factors influence your heart health, including eating and exercise habits. Heart (coronary) risk increases with abnormal blood fat (lipid) levels. Heart-healthy meal planning includes limiting unhealthy fats, increasing healthy fats, and making other small dietary changes. This includes maintaining a healthy body weight to help keep lipid levels within a normal range. What is my plan? Your health care provider recommends that you:  Get no more than _________% of the total calories in your daily diet from fat.  Limit your intake of saturated fat to less than _________% of your total calories each day.  Limit the amount of cholesterol in your diet to less than _________ mg per day.  What types of fat should I choose?  Choose healthy fats more often. Choose monounsaturated and polyunsaturated fats, such as olive oil and canola oil, flaxseeds, walnuts, almonds, and seeds.  Eat more omega-3 fats. Good choices include salmon, mackerel, sardines, tuna, flaxseed oil, and ground flaxseeds. Aim to eat fish at least two times each week.  Limit saturated fats. Saturated fats are primarily found in animal products, such as meats, butter, and cream. Plant sources of saturated fats include palm oil, palm kernel oil, and coconut oil.  Avoid foods with partially hydrogenated oils in them. These contain trans fats. Examples of foods that contain trans fats are stick margarine, some tub margarines, cookies, crackers, and other baked goods. What general guidelines do I need to follow?  Check food labels carefully to identify foods with trans fats or high amounts of saturated fat.  Fill one half of your plate with vegetables and green salads. Eat 4-5 servings of vegetables per day. A serving of vegetables equals 1 cup of raw leafy vegetables,  cup of raw or cooked cut-up vegetables, or  cup of vegetable juice.  Fill one fourth of your plate  with whole grains. Look for the word "whole" as the first word in the ingredient list.  Fill one fourth of your plate with lean protein foods.  Eat 4-5 servings of fruit per day. A serving of fruit equals one medium whole fruit,  cup of dried fruit,  cup of fresh, frozen, or canned fruit, or  cup of 100% fruit juice.  Eat more foods that contain soluble fiber. Examples of foods that contain this type of fiber are apples, broccoli, carrots, beans, peas, and barley. Aim to get 20-30 g of fiber per day.  Eat more home-cooked food and less restaurant, buffet, and fast food.  Limit or avoid alcohol.  Limit foods that are high in starch and sugar.  Avoid fried foods.  Cook foods by using methods other than frying. Baking, boiling, grilling, and broiling are all great options. Other fat-reducing suggestions include: ? Removing the skin from poultry. ? Removing all visible fats from meats. ? Skimming the fat off of stews, soups, and gravies before serving them. ? Steaming vegetables in water or broth.  Lose weight if you are overweight. Losing just 5-10% of your initial body weight can help your overall health and prevent diseases such as diabetes and heart disease.  Increase your consumption of nuts, legumes, and seeds to 4-5 servings per week. One serving of dried beans or legumes equals  cup after being cooked, one serving of nuts equals 1 ounces, and one serving of seeds equals  ounce or 1 tablespoon.  You may need to monitor your salt (sodium) intake, especially if you have high blood pressure. Talk with your health  care provider or dietitian to get more information about reducing sodium. What foods can I eat? Grains  Breads, including [place], white, pita, wheat, raisin, rye, oatmeal, and [place]. Tortillas that are neither fried nor made with lard or trans fat. Low-fat rolls, including hotdog and hamburger buns and English muffins. Biscuits. Muffins. Waffles. Pancakes. Light  popcorn. Whole-grain cereals. Flatbread. Melba toast. Pretzels. Breadsticks. Rusks. Low-fat snacks and crackers, including oyster, saltine, matzo, graham, animal, and rye. Rice and pasta, including brown rice and those that are made with whole wheat. Vegetables All vegetables. Fruits All fruits, but limit coconut. Meats and Other Protein Sources Lean, well-trimmed beef, veal, pork, and lamb. Chicken and [place] without skin. All fish and shellfish. Wild duck, rabbit, pheasant, and venison. Egg whites or low-cholesterol egg substitutes. Dried beans, peas, lentils, and tofu.Seeds and most nuts. Dairy Low-fat or nonfat cheeses, including ricotta, string, and mozzarella. Skim or 1% milk that is liquid, powdered, or evaporated. Buttermilk that is made with low-fat milk. Nonfat or low-fat yogurt. Beverages Mineral water. Diet carbonated beverages. Sweets and Desserts Sherbets and fruit ices. Honey, jam, marmalade, jelly, and syrups. Meringues and gelatins. Pure sugar candy, such as hard candy, jelly beans, gumdrops, mints, marshmallows, and small amounts of dark chocolate. [employer] Eat all sweets and desserts in moderation. Fats and Oils Nonhydrogenated (trans-free) margarines. Vegetable oils, including soybean, sesame, sunflower, olive, peanut, safflower, corn, canola, and cottonseed. Salad dressings or mayonnaise that are made with a vegetable oil. Limit added fats and oils that you use for cooking, baking, salads, and as spreads. Other Cocoa powder. Coffee and tea. All seasonings and condiments. The items listed above may not be a complete list of recommended foods or beverages. Contact your dietitian for more options. What foods are not recommended? Grains Breads that are made with saturated or trans fats, oils, or whole milk. Croissants. Butter rolls. Cheese breads. Sweet rolls. Donuts. Buttered popcorn. Chow mein noodles. High-fat crackers, such as cheese or butter crackers. Meats and  Other Protein Sources Fatty meats, such as hotdogs, short ribs, sausage, spareribs, bacon, ribeye roast or steak, and mutton. High-fat deli meats, such as salami and bologna. Caviar. Domestic duck and goose. Organ meats, such as kidney, liver, sweetbreads, brains, gizzard, chitterlings, and heart. Dairy Cream, sour cream, cream cheese, and creamed cottage cheese. Whole milk cheeses, including blue (bleu), Monterey Jack, [place], [place], American, [place], Swiss, [place], [place], and [place]. Whole or 2% milk that is liquid, evaporated, or condensed. Whole buttermilk. Cream sauce or high-fat cheese sauce. Yogurt that is made from whole milk. Beverages Regular sodas and drinks with added sugar. Sweets and Desserts Frosting. Pudding. Cookies. Cakes other than angel food cake. Candy that has milk chocolate or white chocolate, hydrogenated fat, butter, coconut, or unknown ingredients. Buttered syrups. Full-fat ice cream or ice cream drinks. Fats and Oils Gravy that has suet, meat fat, or shortening. Cocoa butter, hydrogenated oils, palm oil, coconut oil, palm kernel oil. These can often be found in baked products, candy, fried foods, nondairy creamers, and whipped toppings. Solid fats and shortenings, including bacon fat, salt pork, lard, and butter. Nondairy cream substitutes, such as coffee creamers and sour cream substitutes. Salad dressings that are made of unknown oils, cheese, or sour cream. The items listed above may not be a complete list of foods and beverages to avoid. Contact your dietitian for more information. This information is not intended to replace advice given to you by your health care provider. Make sure you discuss any questions  you have with your health care provider. Document Released: 11/06/2007 Document Revised: 08/17/2015 Document Reviewed: 07/21/2013 Elsevier Interactive Patient Education  2018 [place] Maintenance, Male A healthy lifestyle and preventive care is  important for your health and wellness. Ask your health care provider about what schedule of regular examinations is right for you. What should I know about weight and diet? Eat a Healthy Diet  Eat plenty of vegetables, fruits, whole grains, low-fat dairy products, and lean protein.  Do not eat a lot of foods high in solid fats, added sugars, or salt.  Maintain a Healthy Weight Regular exercise can help you achieve or maintain a healthy weight. You should:  Do at least 150 minutes of exercise each week. The exercise should increase your heart rate and make you sweat (moderate-intensity exercise).  Do strength-training exercises at least twice a week.  Watch Your Levels of Cholesterol and Blood Lipids  Have your blood tested for lipids and cholesterol every 5 years starting at 61 years of age. If you are at high risk for heart disease, you should start having your blood tested when you are 61 years old. You may need to have your cholesterol levels checked more often if: ? Your lipid or cholesterol levels are high. ? You are older than 61 years of age. ? You are at high risk for heart disease.  What should I know about cancer screening? Many types of cancers can be detected early and may often be prevented. Lung Cancer  You should be screened every year for lung cancer if: ? You are a current smoker who has smoked for at least 30 years. ? You are a former smoker who has quit within the past 15 years.  Talk to your health care provider about your screening options, when you should start screening, and how often you should be screened.  Colorectal Cancer  Routine colorectal cancer screening usually begins at 61 years of age and should be repeated every 5-10 years until you are 60 years old. You may need to be screened more often if early forms of precancerous polyps or small growths are found. Your health care provider may recommend screening at an earlier age if you have risk factors  for colon cancer.  Your health care provider may recommend using home test kits to check for hidden blood in the stool.  A small camera at the end of a tube can be used to examine your colon (sigmoidoscopy or colonoscopy). This checks for the earliest forms of colorectal cancer.  Prostate and Testicular Cancer  Depending on your age and overall health, your health care provider may do certain tests to screen for prostate and testicular cancer.  Talk to your health care provider about any symptoms or concerns you have about testicular or prostate cancer.  Skin Cancer  Check your skin from head to toe regularly.  Tell your health care provider about any new moles or changes in moles, especially if: ? There is a change in a mole's size, shape, or color. ? You have a mole that is larger than a pencil eraser.  Always use sunscreen. Apply sunscreen liberally and repeat throughout the day.  Protect yourself by wearing long sleeves, pants, a wide-brimmed hat, and sunglasses when outside.  What should I know about heart disease, diabetes, and high blood pressure?  If you are 41-77 years of age, have your blood pressure checked every 3-5 years. If you are 47 years of age or older,  have your blood pressure checked every year. You should have your blood pressure measured twice-once when you are at a hospital or clinic, and once when you are not at a hospital or clinic. Record the average of the two measurements. To check your blood pressure when you are not at a hospital or clinic, you can use: ? An automated blood pressure machine at a pharmacy. ? A home blood pressure monitor.  Talk to your health care provider about your target blood pressure.  If you are between 54-35 years old, ask your health care provider if you should take aspirin to prevent heart disease.  Have regular diabetes screenings by checking your fasting blood sugar level. ? If you are at a normal weight and have a low risk  for diabetes, have this test once every three years after the age of 96. ? If you are overweight and have a high risk for diabetes, consider being tested at a younger age or more often.  A one-time screening for abdominal aortic aneurysm (AAA) by ultrasound is recommended for men aged 83-75 years who are current or former smokers. What should I know about preventing infection? Hepatitis B If you have a higher risk for hepatitis B, you should be screened for this virus. Talk with your health care provider to find out if you are at risk for hepatitis B infection. Hepatitis C Blood testing is recommended for:  Everyone born from [birth year] through 1964.  Anyone with known risk factors for hepatitis C.  Sexually Transmitted Diseases (STDs)  You should be screened each year for STDs including gonorrhea and chlamydia if: ? You are sexually active and are younger than 61 years of age. ? You are older than 61 years of age and your health care provider tells you that you are at risk for this type of infection. ? Your sexual activity has changed since you were last screened and you are at an increased risk for chlamydia or gonorrhea. Ask your health care provider if you are at risk.  Talk with your health care provider about whether you are at high risk of being infected with HIV. Your health care provider may recommend a prescription medicine to help prevent HIV infection.  What else can I do?  Schedule regular health, dental, and eye exams.  Stay current with your vaccines (immunizations).  Do not use any tobacco products, such as cigarettes, chewing tobacco, and e-cigarettes. If you need help quitting, ask your health care provider.  Limit alcohol intake to no more than 2 drinks per day. One drink equals 12 ounces of beer, 5 ounces of wine, or 1 ounces of hard liquor.  Do not use street drugs.  Do not share needles.  Ask your health care provider for help if you need support or information  about quitting drugs.  Tell your health care provider if you often feel depressed.  Tell your health care provider if you have ever been abused or do not feel safe at home. This information is not intended to replace advice given to you by your health care provider. Make sure you discuss any questions you have with your health care provider. Document Released: 07/26/2007 Document Revised: 09/26/2015 Document Reviewed: 10/31/2014 Elsevier Interactive Patient Education  [employer].

## 2017-05-15 ENCOUNTER — Telehealth: Payer: Self-pay | Admitting: *Deleted

## 2017-05-15 NOTE — Telephone Encounter (Signed)
Prior auth for Testosterone 1.62% gel pump sent to [email or web address].

## 2017-05-22 LAB — CUP PACEART REMOTE DEVICE CHECK
Battery Voltage: 3.01 V
Brady Statistic AP VP Percent: 41.14 %
Brady Statistic AS VP Percent: 58.75 %
Brady Statistic RA Percent Paced: 41.13 %
Brady Statistic RV Percent Paced: 99.81 %
Date Time Interrogation Session: 20190320221255
Implantable Lead Implant Date: 20170922
Implantable Lead Location: 753860
Implantable Lead Model: 5076
Implantable Lead Model: 5076
Lead Channel Impedance Value: 323 Ohm
Lead Channel Impedance Value: 418 Ohm
Lead Channel Impedance Value: 494 Ohm
Lead Channel Impedance Value: 570 Ohm
Lead Channel Pacing Threshold Amplitude: 0.875 V
Lead Channel Pacing Threshold Pulse Width: 0.4 ms
Lead Channel Sensing Intrinsic Amplitude: 31.625 mV
Lead Channel Setting Pacing Amplitude: 2 V
Lead Channel Setting Pacing Pulse Width: 0.4 ms
MDC IDC LEAD IMPLANT DT: 20170922
MDC IDC LEAD LOCATION: 753859
MDC IDC MSMT BATTERY REMAINING LONGEVITY: 97 mo
MDC IDC MSMT LEADCHNL RA SENSING INTR AMPL: 2.625 mV
MDC IDC MSMT LEADCHNL RA SENSING INTR AMPL: 2.625 mV
MDC IDC MSMT LEADCHNL RV PACING THRESHOLD AMPLITUDE: 0.875 V
MDC IDC MSMT LEADCHNL RV PACING THRESHOLD PULSEWIDTH: 0.4 ms
MDC IDC MSMT LEADCHNL RV SENSING INTR AMPL: 31.625 mV
MDC IDC PG IMPLANT DT: 20170922
MDC IDC SET LEADCHNL RV PACING AMPLITUDE: 2.5 V
MDC IDC SET LEADCHNL RV SENSING SENSITIVITY: 2 mV
MDC IDC STAT BRADY AP VS PERCENT: 0.02 %
MDC IDC STAT BRADY AS VS PERCENT: 0.09 %

## 2017-06-01 NOTE — Telephone Encounter (Signed)
Noted  

## 2017-06-01 NOTE — Telephone Encounter (Signed)
Fax received from [facility] stating the request was denied and this was given to Dr Truddie Hidden asst.

## 2017-06-02 ENCOUNTER — Other Ambulatory Visit: Payer: Self-pay | Admitting: Internal Medicine

## 2017-06-02 MED ORDER — TESTOSTERONE 4 MG/24HR TD PT24: 1.0000 | MEDICATED_PATCH | Freq: Every day | TRANSDERMAL | 3 refills | Status: DC

## 2017-06-03 ENCOUNTER — Other Ambulatory Visit: Payer: Self-pay | Admitting: Internal Medicine

## 2017-06-03 MED ORDER — LISINOPRIL 20 MG PO TABS: 20.0000 mg | ORAL_TABLET | Freq: Every day | ORAL | 3 refills | Status: DC

## 2017-06-03 NOTE — Telephone Encounter (Signed)
New Message:   Pt says he needs something else to take in the place of [place].He says the pharmacist says there is a shortage of it all over.

## 2017-06-03 NOTE — Telephone Encounter (Signed)
Patient aware and verbalized understanding.  rx sent to pharmacy.   Patient will monitor BP and call with any changes.

## 2017-06-03 NOTE — Telephone Encounter (Signed)
I do not see that pt has tried an ACEi. With backorders and recalls would recommend change to lisinopril 20mg  daily. If able have pt monitor pressures for 3-4 weeks after change and call with changes. He can be scheduled in HTN clinic if needed.

## 2017-06-11 ENCOUNTER — Other Ambulatory Visit: Payer: Self-pay | Admitting: Internal Medicine

## 2017-06-11 MED ORDER — TESTOSTERONE 4 MG/24HR TD PT24: 1.0000 | MEDICATED_PATCH | Freq: Every day | TRANSDERMAL | 3 refills | Status: DC

## 2017-06-12 ENCOUNTER — Telehealth: Payer: Self-pay | Admitting: Internal Medicine

## 2017-06-12 ENCOUNTER — Other Ambulatory Visit: Payer: Self-pay | Admitting: Internal Medicine

## 2017-06-12 NOTE — Telephone Encounter (Signed)
Called pharmacy. They did receive script but it needs PA. Will initiate on Monday.

## 2017-06-12 NOTE — Telephone Encounter (Signed)
Copied from [place] [id number]. Topic: Quick Communication - See Telephone Encounter >> Jun 12, 2017  4:13 PM Vernona Rieger wrote: CRM for notification. See Telephone encounter for: 06/12/17.  Patient would like to know if the original prescription testosterone (ANDRODERM) 4 MG/24HR PT24 patch can be mailed to him. He went to the pharmacy today and they do not have it. Spoke with Colletta Maryland at the office & she said that it was phoned in yesterday 5/2. He is aware Dr Raliegh Ip & his nurse if out of the office today.

## 2017-06-15 NOTE — Telephone Encounter (Signed)
PA initiated via CoverMyMeds, awaiting determination. Key [id number].

## 2017-06-16 ENCOUNTER — Other Ambulatory Visit: Payer: Self-pay

## 2017-06-16 MED ORDER — LEVOTHYROXINE SODIUM 75 MCG PO TABS: 75.0000 ug | ORAL_TABLET | Freq: Every day | ORAL | 0 refills | Status: DC

## 2017-06-19 NOTE — Telephone Encounter (Signed)
PA denied, see below. Please advise if you would like to try appeal/repeat labs etc.  Per health plan criteria for Androderm, coverage is denied. Androderm can be approved if: All of the following: (1) Your patient has two pre-treatment serum total testosterone levels less than 300 ng/dL (<10.4 nmol/L) or less than the reference range for the lab, taken at separate times (this may require treatment to be temporarily held. Document lab value and date for both levels). (2) Your patient has one of the following: (i) Significant reduction in weight (less than 90% ideal body weight) (e.g., AIDS wasting syndrome). (ii) Osteopenia. (iii) Osteoporosis. (iv) Decreased bone density. (v) Decreased libido. (vi) Organic cause of testosterone deficiency (e.g., injury, tumor, infection, or genetic defects).

## 2017-06-25 ENCOUNTER — Other Ambulatory Visit: Payer: Self-pay

## 2017-07-07 ENCOUNTER — Telehealth: Payer: Self-pay | Admitting: Family Medicine

## 2017-07-07 NOTE — Telephone Encounter (Signed)
Copied from [place] [id number]. Topic: Inquiry >> Jul 07, 2017  2:35 PM Pricilla Handler wrote: Reason for CRM: Patient called stating that the testosterone Renae Gloss) 4 MG/24HR PT24 patch that he was prescribed has to be changed to testosterone (ANDRODERM) Gel. According to the [facility] pharmacy, the coupon given to the patient only applies to the Androderm gel, and not the patch. Patient wants Dr. Raliegh Ip and /or his assistant to give him a call about changing this prescription.       Thank You!!!

## 2017-07-09 ENCOUNTER — Other Ambulatory Visit: Payer: Self-pay

## 2017-07-09 NOTE — Telephone Encounter (Signed)
Dr.Kwiatkowski please advise on sig., strength, quantity and dose.

## 2017-07-10 MED ORDER — TESTOSTERONE 50 MG/5GM (1%) TD GEL: 5.0000 g | Freq: Every day | TRANSDERMAL | 0 refills | Status: DC

## 2017-07-10 NOTE — Telephone Encounter (Signed)
Medication phoned in.

## 2017-07-10 NOTE — Telephone Encounter (Signed)
Androderm gel (Testosterone gel 1%)  5 g (50 mg) packets)  #30  Apply to skin once daily in am

## 2017-07-13 NOTE — Telephone Encounter (Signed)
Left detailed message informing pt of update. 

## 2017-07-15 ENCOUNTER — Telehealth: Payer: Self-pay | Admitting: Family Medicine

## 2017-07-15 NOTE — Telephone Encounter (Signed)
Copied from [place] [id number]. Topic: General - Other >> Jul 14, 2017  5:02 PM Yvette Rack wrote: Reason for CRM: Pt requesting a hand written Rx for Andro Gel so he can go to different pharmacies to compare the price as this will be done as self pay with a Rx coupon. Pt states he sent a first class letter with a return envelope stating this but he will come pick up the Rx if he needs to. Cb# [phone].

## 2017-07-17 ENCOUNTER — Other Ambulatory Visit: Payer: Self-pay

## 2017-07-17 MED ORDER — TESTOSTERONE 50 MG/5GM (1%) TD GEL: 5.0000 g | Freq: Every day | TRANSDERMAL | 0 refills | Status: DC

## 2017-07-17 NOTE — Telephone Encounter (Signed)
Rx printed and ready to be picked up. Pt notified!

## 2017-07-29 ENCOUNTER — Ambulatory Visit (INDEPENDENT_AMBULATORY_CARE_PROVIDER_SITE_OTHER): Payer: 59 | Admitting: *Deleted

## 2017-07-29 DIAGNOSIS — I495 Sick sinus syndrome: Secondary | ICD-10-CM

## 2017-07-29 DIAGNOSIS — I442 Atrioventricular block, complete: Secondary | ICD-10-CM

## 2017-07-29 NOTE — Progress Notes (Signed)
Remote pacemaker transmission.   

## 2017-07-31 LAB — CUP PACEART REMOTE DEVICE CHECK
Battery Remaining Longevity: 95 mo
Battery Voltage: 3.01 V
Brady Statistic AP VP Percent: 42.8 %
Brady Statistic AP VS Percent: 0.02 %
Brady Statistic RA Percent Paced: 42.79 %
Brady Statistic RV Percent Paced: 99.76 %
Date Time Interrogation Session: 20190619114040
Implantable Lead Implant Date: 20170922
Implantable Lead Implant Date: 20170922
Implantable Lead Location: 753859
Implantable Lead Model: 5076
Implantable Pulse Generator Implant Date: 20170922
Lead Channel Impedance Value: 418 Ohm
Lead Channel Impedance Value: 494 Ohm
Lead Channel Impedance Value: 570 Ohm
Lead Channel Pacing Threshold Amplitude: 0.875 V
Lead Channel Sensing Intrinsic Amplitude: 1.25 mV
Lead Channel Setting Pacing Amplitude: 2 V
Lead Channel Setting Pacing Amplitude: 2.5 V
Lead Channel Setting Sensing Sensitivity: 2 mV
MDC IDC LEAD LOCATION: 753860
MDC IDC MSMT LEADCHNL RA IMPEDANCE VALUE: 342 Ohm
MDC IDC MSMT LEADCHNL RA PACING THRESHOLD PULSEWIDTH: 0.4 ms
MDC IDC MSMT LEADCHNL RA SENSING INTR AMPL: 1.25 mV
MDC IDC MSMT LEADCHNL RV PACING THRESHOLD AMPLITUDE: 0.875 V
MDC IDC MSMT LEADCHNL RV PACING THRESHOLD PULSEWIDTH: 0.4 ms
MDC IDC MSMT LEADCHNL RV SENSING INTR AMPL: 11 mV
MDC IDC MSMT LEADCHNL RV SENSING INTR AMPL: 11 mV
MDC IDC SET LEADCHNL RV PACING PULSEWIDTH: 0.4 ms
MDC IDC STAT BRADY AS VP PERCENT: 57.08 %
MDC IDC STAT BRADY AS VS PERCENT: 0.09 %

## 2017-08-28 ENCOUNTER — Other Ambulatory Visit: Payer: Self-pay | Admitting: Internal Medicine

## 2017-08-31 ENCOUNTER — Other Ambulatory Visit: Payer: Self-pay

## 2017-08-31 MED ORDER — TESTOSTERONE 50 MG/5GM (1%) TD GEL: TRANSDERMAL | 0 refills | Status: DC

## 2017-08-31 NOTE — Telephone Encounter (Signed)
Okay for refill?  

## 2017-08-31 NOTE — Telephone Encounter (Signed)
Last fill 07/17/17 Last OV 05/12/17 Ok to fill?

## 2017-08-31 NOTE — Telephone Encounter (Signed)
Rx called in to pharmacy (unable to print)

## 2017-09-04 ENCOUNTER — Other Ambulatory Visit: Payer: Self-pay | Admitting: Internal Medicine

## 2017-09-05 ENCOUNTER — Other Ambulatory Visit: Payer: Self-pay | Admitting: Internal Medicine

## 2017-09-07 NOTE — Telephone Encounter (Signed)
OK for 3 mo but will need to schedule appt w/in this timeframe

## 2017-09-07 NOTE — Telephone Encounter (Signed)
Is this okay to refill? 

## 2017-09-24 ENCOUNTER — Other Ambulatory Visit: Payer: Self-pay | Admitting: Internal Medicine

## 2017-09-24 NOTE — Telephone Encounter (Signed)
Spoke to pt concerning refill for Lisinopril. Have not seen this med on any med lists from past office visits. Pt stated his pharmacy did not have Olmesartan at the time so they gave him Lisinopril 20 mg to take until the olmesartan came in. He stated he took the lisinopril for about 2 months and has been back on the Olmesartan for about a week now.   Told pt I will cancel lisinopril Rx. He stated he does not currently need Rx for Olmesartan, but will call when he does. Pt verbalized thanks for the call.  Med list updated.  Routing to Dr. Debara Pickett as Juluis Rainier.

## 2017-10-09 ENCOUNTER — Encounter: Payer: 59 | Admitting: Internal Medicine

## 2017-10-23 DIAGNOSIS — J3089 Other allergic rhinitis: Secondary | ICD-10-CM | POA: Diagnosis not present

## 2017-10-23 DIAGNOSIS — R05 Cough: Secondary | ICD-10-CM | POA: Diagnosis not present

## 2017-10-23 DIAGNOSIS — J309 Allergic rhinitis, unspecified: Secondary | ICD-10-CM | POA: Diagnosis not present

## 2017-10-28 ENCOUNTER — Ambulatory Visit (INDEPENDENT_AMBULATORY_CARE_PROVIDER_SITE_OTHER): Payer: 59 | Admitting: *Deleted

## 2017-10-28 ENCOUNTER — Telehealth: Payer: Self-pay | Admitting: Cardiology

## 2017-10-28 DIAGNOSIS — I442 Atrioventricular block, complete: Secondary | ICD-10-CM | POA: Diagnosis not present

## 2017-10-28 NOTE — Telephone Encounter (Signed)
LMOVM reminding pt to send remote transmission.   

## 2017-10-29 NOTE — Progress Notes (Signed)
Remote pacemaker transmission.   

## 2017-10-30 DIAGNOSIS — J3089 Other allergic rhinitis: Secondary | ICD-10-CM | POA: Diagnosis not present

## 2017-11-01 ENCOUNTER — Other Ambulatory Visit: Payer: Self-pay | Admitting: Internal Medicine

## 2017-11-10 HISTORY — PX: CARDIAC CATHETERIZATION: SHX172

## 2017-11-17 ENCOUNTER — Other Ambulatory Visit: Payer: Self-pay | Admitting: Internal Medicine

## 2017-11-19 ENCOUNTER — Encounter: Payer: Self-pay | Admitting: Internal Medicine

## 2017-11-19 ENCOUNTER — Ambulatory Visit: Payer: 59 | Admitting: Internal Medicine

## 2017-11-19 ENCOUNTER — Encounter: Payer: 59 | Admitting: Internal Medicine

## 2017-11-19 ENCOUNTER — Ambulatory Visit
Admission: RE | Admit: 2017-11-19 | Discharge: 2017-11-19 | Disposition: A | Discharge status: Home / Self Care | Payer: 59 | Source: Ambulatory Visit | Attending: Internal Medicine | Admitting: Internal Medicine

## 2017-11-19 ENCOUNTER — Encounter (INDEPENDENT_AMBULATORY_CARE_PROVIDER_SITE_OTHER): Payer: Self-pay

## 2017-11-19 VITALS — BP 138/82 | HR 50 | Ht 71.0 in | Wt 186.4 lb

## 2017-11-19 DIAGNOSIS — I2510 Atherosclerotic heart disease of native coronary artery without angina pectoris: Secondary | ICD-10-CM

## 2017-11-19 DIAGNOSIS — R0602 Shortness of breath: Secondary | ICD-10-CM

## 2017-11-19 DIAGNOSIS — I442 Atrioventricular block, complete: Secondary | ICD-10-CM

## 2017-11-19 DIAGNOSIS — R042 Hemoptysis: Secondary | ICD-10-CM | POA: Diagnosis not present

## 2017-11-19 DIAGNOSIS — Z23 Encounter for immunization: Secondary | ICD-10-CM | POA: Diagnosis not present

## 2017-11-19 DIAGNOSIS — Z950 Presence of cardiac pacemaker: Secondary | ICD-10-CM

## 2017-11-19 NOTE — Patient Instructions (Addendum)
Medication Instructions:  Your physician recommends that you continue on your current medications as directed. Please refer to the Current Medication list given to you today.  Labwork: None ordered.  Testing/Procedures: A chest x-ray takes a picture of the organs and structures inside the chest, including the heart, lungs, and blood vessels. This test can show several things, including, whether the heart is enlarges; whether fluid is building up in the lungs; and whether pacemaker / defibrillator leads are still in place.  Your physician has requested that you have an echocardiogram. Echocardiography is a painless test that uses sound waves to create images of your heart. It provides your doctor with information about the size and shape of your heart and how well your heart's chambers and valves are working. This procedure takes approximately one hour. There are no restrictions for this procedure.  Please schedule for ECHO  Follow-Up:  Your physician wants you to follow-up in: 2 months with Dr. Debara Pickett.  Your physician wants you to follow-up in: one year with Dr. Rayann Heman.   You will receive a reminder letter in the mail two months in advance. If you don't receive a letter, please call our office to schedule the follow-up appointment.  Remote monitoring is used to monitor your Pacemaker from home. This monitoring reduces the number of office visits required to check your device to one time per year. It allows [place] to keep an eye on the functioning of your device to ensure it is working properly. You are scheduled for a device check from home on 01/27/2018. You may send your transmission at any time that day. If you have a wireless device, the transmission will be sent automatically. After your physician reviews your transmission, you will receive a postcard with your next transmission date.  Any Other Special Instructions Will Be Listed Below (If Applicable).  If you need a refill on your cardiac  medications before your next appointment, please call your pharmacy.

## 2017-11-19 NOTE — H&P (View-Only) (Signed)
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  Since last being seen in our clinic, the patient reports doing very well.  He remains active.  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  This has occurred twice after sex and once while at the gym.  In general, he does not have dypsnea.  He has had 2 episodes over the past 6 months of "a gurgling sensation" in his chest with hemoptysis.  This has not recurred.  He has a h/o lymphoma (remotely) and is worried about this.  His PCP has recently retired and he is currently in transition to find a new PCP.  Today, he denies symptoms of palpitations, chest pain,   lower extremity edema, dizziness, presyncope, or syncope.  The patient is otherwise without complaint today.   Past Medical History:  Diagnosis Date  . Abnormal TSH   . CAD in native artery    a. 11/2015: s/p 2 vessel PCI of the first diagonal branch of the LAD in the mid left circumflex with DES.  Marland Kitchen Complete heart block (HCC)    a. s/p Medtronic ppm 10/2015.  Marland Kitchen Hodgkin disease ([place])    a. s/p chest wall radiation.  . Pacemaker 10/2015   Medtronic  . PVC's (premature ventricular contractions)   . Right bundle branch block    Past Surgical History:  Procedure Laterality Date  . CARDIAC CATHETERIZATION N/A 11/02/2015   Procedure: Left Heart Cath and Coronary Angiography;  Surgeon: Sherren Mocha, MD;  Location: [facility] [facility];  Service: Cardiovascular;  Laterality: N/A;  . CARDIAC CATHETERIZATION N/A 11/21/2015   Procedure: Coronary Stent Intervention;  Surgeon: Sherren Mocha, MD;  Location: [place];  Service: Cardiovascular;  Laterality: N/A;  . EP IMPLANTABLE DEVICE N/A 11/02/2015   MDT Adivisa MRI conditional dual chamber pacemaker implanted by Dr Rayann Heman for complete heart block  . KNEE SURGERY  Right 1972  . LUMBAR LAMINECTOMY  1996    ROS- all systems are reviewed and negative except as per HPI above  Current Outpatient Medications  Medication Sig Dispense Refill  . atorvastatin (LIPITOR) 80 MG tablet Take 1 tablet (80 mg total) by mouth daily at 6 PM. 90 tablet 3  . levocetirizine (XYZAL) 5 MG tablet Take 1 tablet by mouth at bedtime.  3  . levothyroxine (SYNTHROID, LEVOTHROID) 75 MCG tablet TAKE 1 TABLET BY MOUTH  DAILY 90 tablet 0  . montelukast (SINGULAIR) 10 MG tablet Take 1 tablet by mouth at bedtime.  3  . Multiple Vitamin (MULTIVITAMIN) tablet Take 1 tablet by mouth daily.    Marland Kitchen olmesartan (BENICAR) 20 MG tablet Take 20 mg by mouth daily.    Marland Kitchen testosterone (ANDROGEL) 50 MG/5GM (1%) GEL PLACE 1 PACKET (5 GRAMS) ONTO THE SKIN ONCE DAILY 150 g 0  . traZODone (DESYREL) 50 MG tablet Take 1 tablet (50 mg total) by mouth at bedtime. 90 tablet 3   No current facility-administered medications for this visit.     Physical Exam: Vitals:   11/19/17 0839  BP: 138/82  Pulse: (!) 50  SpO2: 98%  Weight: 186 lb 6.4 oz (84.6 kg)  Height: 5\' 11"  (1.803 m)    GEN- The patient is well appearing, alert and oriented x 3 today.   Head- normocephalic, atraumatic Eyes-  Sclera  clear, conjunctiva pink Ears- hearing intact Oropharynx- clear Lungs- Clear to ausculation bilaterally, normal work of breathing Chest- pacemaker pocket is well healed Heart- Regular rate and rhythm, no murmurs, rubs or gallops, PMI not laterally displaced GI- soft, NT, ND, + BS Extremities- no clubbing, cyanosis, or edema  Pacemaker interrogation- reviewed in detail today,  See PACEART report    Assessment and Plan:  1. Symptomatic complete heart block Normal pacemaker function See Pace Art report No changes today  2. CAD No ischemic symptoms No changes  3. PVCs Stable No change required today  4. HTN Stable No change required today  5. Occasional SOB with rare hemoptysis and h/o remote  lymphoma Lungs are clear today and no recent symptoms.  Denies TB exposure.  Did not smoke.  I will order PA and Lateral CXR today.  I have also advised that he find a good PCP to further direct his care. I will also order an echo.  He V paces 100%.  I would like to make sure his EF has not dropped.  [facility] Return to see me in a year Follow-up with Dr Debara Pickett as scheduled  [name] MD, [facility] 11/19/2017 9:02 AM

## 2017-11-19 NOTE — Progress Notes (Signed)
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  Since last being seen in our clinic, the patient reports doing very well.  He remains active.  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  This has occurred twice after sex and once while at the gym.  In general, he does not have dypsnea.  He has had 2 episodes over the past 6 months of "a gurgling sensation" in his chest with hemoptysis.  This has not recurred.  He has a h/o lymphoma (remotely) and is worried about this.  His PCP has recently retired and he is currently in transition to find a new PCP.  Today, he denies symptoms of palpitations, chest pain,   lower extremity edema, dizziness, presyncope, or syncope.  The patient is otherwise without complaint today.   Past Medical History:  Diagnosis Date  . Abnormal TSH   . CAD in native artery    a. 11/2015: s/p 2 vessel PCI of the first diagonal branch of the LAD in the mid left circumflex with DES.  Marland Kitchen Complete heart block (HCC)    a. s/p Medtronic ppm 10/2015.  Marland Kitchen Hodgkin disease ([place])    a. s/p chest wall radiation.  . Pacemaker 10/2015   Medtronic  . PVC's (premature ventricular contractions)   . Right bundle branch block    Past Surgical History:  Procedure Laterality Date  . CARDIAC CATHETERIZATION N/A 11/02/2015   Procedure: Left Heart Cath and Coronary Angiography;  Surgeon: Sherren Mocha, MD;  Location: [facility] [place] [facility];  Service: Cardiovascular;  Laterality: N/A;  . CARDIAC CATHETERIZATION N/A 11/21/2015   Procedure: Coronary Stent Intervention;  Surgeon: Sherren Mocha, MD;  Location: [place] [facility];  Service: Cardiovascular;  Laterality: N/A;  . EP IMPLANTABLE DEVICE N/A 11/02/2015   MDT Adivisa MRI conditional dual chamber pacemaker implanted by Dr Rayann Heman for complete heart block  . KNEE SURGERY  Right 1972  . LUMBAR LAMINECTOMY  1996    ROS- all systems are reviewed and negative except as per HPI above  Current Outpatient Medications  Medication Sig Dispense Refill  . atorvastatin (LIPITOR) 80 MG tablet Take 1 tablet (80 mg total) by mouth daily at 6 PM. 90 tablet 3  . levocetirizine (XYZAL) 5 MG tablet Take 1 tablet by mouth at bedtime.  3  . levothyroxine (SYNTHROID, LEVOTHROID) 75 MCG tablet TAKE 1 TABLET BY MOUTH  DAILY 90 tablet 0  . montelukast (SINGULAIR) 10 MG tablet Take 1 tablet by mouth at bedtime.  3  . Multiple Vitamin (MULTIVITAMIN) tablet Take 1 tablet by mouth daily.    Marland Kitchen olmesartan (BENICAR) 20 MG tablet Take 20 mg by mouth daily.    Marland Kitchen testosterone (ANDROGEL) 50 MG/5GM (1%) GEL PLACE 1 PACKET (5 GRAMS) ONTO THE SKIN ONCE DAILY 150 g 0  . traZODone (DESYREL) 50 MG tablet Take 1 tablet (50 mg total) by mouth at bedtime. 90 tablet 3   No current facility-administered medications for this visit.     Physical Exam: Vitals:   11/19/17 0839  BP: 138/82  Pulse: (!) 50  SpO2: 98%  Weight: 186 lb 6.4 oz (84.6 kg)  Height: 5\' 11"  (1.803 m)    GEN- The patient is well appearing, alert and oriented x 3 today.   Head- normocephalic, atraumatic Eyes-  Sclera  clear, conjunctiva pink Ears- hearing intact Oropharynx- clear Lungs- Clear to ausculation bilaterally, normal work of breathing Chest- pacemaker pocket is well healed Heart- Regular rate and rhythm, no murmurs, rubs or gallops, PMI not laterally displaced GI- soft, NT, ND, + BS Extremities- no clubbing, cyanosis, or edema  Pacemaker interrogation- reviewed in detail today,  See PACEART report    Assessment and Plan:  1. Symptomatic complete heart block Normal pacemaker function See Pace Art report No changes today  2. CAD No ischemic symptoms No changes  3. PVCs Stable No change required today  4. HTN Stable No change required today  5. Occasional SOB with rare hemoptysis and h/o remote  lymphoma Lungs are clear today and no recent symptoms.  Denies TB exposure.  Did not smoke.  I will order PA and Lateral CXR today.  I have also advised that he find a good PCP to further direct his care. I will also order an echo.  He V paces 100%.  I would like to make sure his EF has not dropped.  Carelink Return to see me in a year Follow-up with Dr Debara Pickett as scheduled  [name] MD, [facility] 11/19/2017 9:02 AM

## 2017-11-19 NOTE — H&P (View-Only) (Signed)
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  Since last being seen in our clinic, the patient reports doing very well.  He remains active.  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  This has occurred twice after sex and once while at the gym.  In general, he does not have dypsnea.  He has had 2 episodes over the past 6 months of "a gurgling sensation" in his chest with hemoptysis.  This has not recurred.  He has a h/o lymphoma (remotely) and is worried about this.  His PCP has recently retired and he is currently in transition to find a new PCP.  Today, he denies symptoms of palpitations, chest pain,   lower extremity edema, dizziness, presyncope, or syncope.  The patient is otherwise without complaint today.   Past Medical History:  Diagnosis Date  . Abnormal TSH   . CAD in native artery    a. 11/2015: s/p 2 vessel PCI of the first diagonal branch of the LAD in the mid left circumflex with DES.  Marland Kitchen Complete heart block (HCC)    a. s/p Medtronic ppm 10/2015.  Marland Kitchen Hodgkin disease ([place])    a. s/p chest wall radiation.  . Pacemaker 10/2015   Medtronic  . PVC's (premature ventricular contractions)   . Right bundle branch block    Past Surgical History:  Procedure Laterality Date  . CARDIAC CATHETERIZATION N/A 11/02/2015   Procedure: Left Heart Cath and Coronary Angiography;  Surgeon: Sherren Mocha, MD;  Location: [facility];  Service: Cardiovascular;  Laterality: N/A;  . CARDIAC CATHETERIZATION N/A 11/21/2015   Procedure: Coronary Stent Intervention;  Surgeon: Sherren Mocha, MD;  Location: [place] [facility];  Service: Cardiovascular;  Laterality: N/A;  . EP IMPLANTABLE DEVICE N/A 11/02/2015   MDT Adivisa MRI conditional dual chamber pacemaker implanted by Dr Rayann Heman for complete heart block  . KNEE SURGERY  Right 1972  . LUMBAR LAMINECTOMY  1996    ROS- all systems are reviewed and negative except as per HPI above  Current Outpatient Medications  Medication Sig Dispense Refill  . atorvastatin (LIPITOR) 80 MG tablet Take 1 tablet (80 mg total) by mouth daily at 6 PM. 90 tablet 3  . levocetirizine (XYZAL) 5 MG tablet Take 1 tablet by mouth at bedtime.  3  . levothyroxine (SYNTHROID, LEVOTHROID) 75 MCG tablet TAKE 1 TABLET BY MOUTH  DAILY 90 tablet 0  . montelukast (SINGULAIR) 10 MG tablet Take 1 tablet by mouth at bedtime.  3  . Multiple Vitamin (MULTIVITAMIN) tablet Take 1 tablet by mouth daily.    Marland Kitchen olmesartan (BENICAR) 20 MG tablet Take 20 mg by mouth daily.    Marland Kitchen testosterone (ANDROGEL) 50 MG/5GM (1%) GEL PLACE 1 PACKET (5 GRAMS) ONTO THE SKIN ONCE DAILY 150 g 0  . traZODone (DESYREL) 50 MG tablet Take 1 tablet (50 mg total) by mouth at bedtime. 90 tablet 3   No current facility-administered medications for this visit.     Physical Exam: Vitals:   11/19/17 0839  BP: 138/82  Pulse: (!) 50  SpO2: 98%  Weight: 186 lb 6.4 oz (84.6 kg)  Height: 5\' 11"  (1.803 m)    GEN- The patient is well appearing, alert and oriented x 3 today.   Head- normocephalic, atraumatic Eyes-  Sclera  clear, conjunctiva pink Ears- hearing intact Oropharynx- clear Lungs- Clear to ausculation bilaterally, normal work of breathing Chest- pacemaker pocket is well healed Heart- Regular rate and rhythm, no murmurs, rubs or gallops, PMI not laterally displaced GI- soft, NT, ND, + BS Extremities- no clubbing, cyanosis, or edema  Pacemaker interrogation- reviewed in detail today,  See PACEART report    Assessment and Plan:  1. Symptomatic complete heart block Normal pacemaker function See Pace Art report No changes today  2. CAD No ischemic symptoms No changes  3. PVCs Stable No change required today  4. HTN Stable No change required today  5. Occasional SOB with rare hemoptysis and h/o remote  lymphoma Lungs are clear today and no recent symptoms.  Denies TB exposure.  Did not smoke.  I will order PA and Lateral CXR today.  I have also advised that he find a good PCP to further direct his care. I will also order an echo.  He V paces 100%.  I would like to make sure his EF has not dropped.  Carelink Return to see me in a year Follow-up with Dr Debara Pickett as scheduled  [name] MD, [facility] 11/19/2017 9:02 AM

## 2017-11-20 ENCOUNTER — Other Ambulatory Visit: Payer: Self-pay

## 2017-11-20 DIAGNOSIS — I2510 Atherosclerotic heart disease of native coronary artery without angina pectoris: Secondary | ICD-10-CM

## 2017-11-20 LAB — CUP PACEART REMOTE DEVICE CHECK
Battery Remaining Longevity: 93 mo
Battery Voltage: 3.01 V
Brady Statistic AP VP Percent: 38.38 %
Brady Statistic AS VS Percent: 0.13 %
Brady Statistic RA Percent Paced: 38.3 %
Implantable Lead Implant Date: 20170922
Implantable Lead Implant Date: 20170922
Implantable Lead Location: 753860
Implantable Lead Model: 5076
Implantable Pulse Generator Implant Date: 20170922
Lead Channel Impedance Value: 342 Ohm
Lead Channel Impedance Value: 418 Ohm
Lead Channel Impedance Value: 494 Ohm
Lead Channel Impedance Value: 551 Ohm
Lead Channel Pacing Threshold Amplitude: 0.875 V
Lead Channel Pacing Threshold Amplitude: 0.875 V
Lead Channel Pacing Threshold Pulse Width: 0.4 ms
Lead Channel Pacing Threshold Pulse Width: 0.4 ms
Lead Channel Sensing Intrinsic Amplitude: 1.375 mV
Lead Channel Setting Pacing Amplitude: 2 V
Lead Channel Setting Sensing Sensitivity: 2 mV
MDC IDC LEAD LOCATION: 753859
MDC IDC MSMT LEADCHNL RA SENSING INTR AMPL: 1.375 mV
MDC IDC MSMT LEADCHNL RV SENSING INTR AMPL: 31.25 mV
MDC IDC MSMT LEADCHNL RV SENSING INTR AMPL: 31.25 mV
MDC IDC SESS DTM: 20190918212845
MDC IDC SET LEADCHNL RV PACING AMPLITUDE: 2.5 V
MDC IDC SET LEADCHNL RV PACING PULSEWIDTH: 0.4 ms
MDC IDC STAT BRADY AP VS PERCENT: 0.04 %
MDC IDC STAT BRADY AS VP PERCENT: 61.45 %
MDC IDC STAT BRADY RV PERCENT PACED: 99.64 %

## 2017-11-20 NOTE — Progress Notes (Signed)
Left VM notifying of referral

## 2017-11-25 ENCOUNTER — Other Ambulatory Visit: Payer: Self-pay

## 2017-11-25 ENCOUNTER — Ambulatory Visit (HOSPITAL_COMMUNITY): Payer: 59 | Attending: Cardiology

## 2017-11-25 DIAGNOSIS — R0602 Shortness of breath: Secondary | ICD-10-CM | POA: Diagnosis present

## 2017-11-26 ENCOUNTER — Telehealth: Payer: Self-pay

## 2017-11-26 DIAGNOSIS — I2510 Atherosclerotic heart disease of native coronary artery without angina pectoris: Secondary | ICD-10-CM

## 2017-11-26 DIAGNOSIS — I428 Other cardiomyopathies: Secondary | ICD-10-CM

## 2017-11-26 DIAGNOSIS — R0602 Shortness of breath: Secondary | ICD-10-CM

## 2017-11-26 NOTE — Telephone Encounter (Signed)
Left message for Pt requesting call back. °

## 2017-11-26 NOTE — Telephone Encounter (Signed)
error 

## 2017-11-26 NOTE — Telephone Encounter (Signed)
-----   Message from [name], MD sent at 11/25/2017  9:23 PM EDT ----- Results reviewed.  Sonia Baller, please inform pt of result. EF is newly depressed with anterior wall motion abnormality.  He also has significant MR. Please schedule for right and left heart cath as well as TEE to further evaluate.  I will include Dr Debara Pickett who also follows the patient as FYI.

## 2017-11-26 NOTE — Telephone Encounter (Signed)
Call back received from Pt.  Discussed findings of ECHO and further work up.  Will schedule Pt for heart cath with Dr. Irish Lack on December 02, 2017.  Pt to arrive for labs tomorrow at [facility] and will pick up instruction letter.  Per Pt would like to schedule TEE for Nov 4 or 5.  Will follow up.   TEE scheduled for November 5.    Work up complete.  Will cont to monitor.

## 2017-11-27 ENCOUNTER — Other Ambulatory Visit: Payer: 59 | Admitting: *Deleted

## 2017-11-27 DIAGNOSIS — R0602 Shortness of breath: Secondary | ICD-10-CM

## 2017-11-27 DIAGNOSIS — I2510 Atherosclerotic heart disease of native coronary artery without angina pectoris: Secondary | ICD-10-CM

## 2017-11-27 DIAGNOSIS — I428 Other cardiomyopathies: Secondary | ICD-10-CM | POA: Diagnosis not present

## 2017-11-28 LAB — CBC WITH DIFFERENTIAL/PLATELET
Basophils Absolute: 0 10*3/uL (ref 0.0–0.2)
Basos: 0 %
EOS (ABSOLUTE): 0.2 10*3/uL (ref 0.0–0.4)
Eos: 2 %
HEMATOCRIT: 41.2 % (ref 37.5–51.0)
HEMOGLOBIN: 14.6 g/dL (ref 13.0–17.7)
IMMATURE GRANS (ABS): 0 10*3/uL (ref 0.0–0.1)
Immature Granulocytes: 0 %
LYMPHS ABS: 1.1 10*3/uL (ref 0.7–3.1)
LYMPHS: 12 %
MCH: 31.9 pg (ref 26.6–33.0)
MCHC: 35.4 g/dL (ref 31.5–35.7)
MCV: 90 fL (ref 79–97)
MONOCYTES: 7 %
Monocytes Absolute: 0.6 10*3/uL (ref 0.1–0.9)
NEUTROS ABS: 6.9 10*3/uL (ref 1.4–7.0)
Neutrophils: 79 %
Platelets: 289 10*3/uL (ref 150–450)
RBC: 4.57 x10E6/uL (ref 4.14–5.80)
RDW: 13.6 % (ref 12.3–15.4)
WBC: 8.9 10*3/uL (ref 3.4–10.8)

## 2017-11-28 LAB — BASIC METABOLIC PANEL
BUN / CREAT RATIO: 15 (ref 10–24)
BUN: 14 mg/dL (ref 8–27)
CO2: 21 mmol/L (ref 20–29)
CREATININE: 0.95 mg/dL (ref 0.76–1.27)
Calcium: 9.3 mg/dL (ref 8.6–10.2)
Chloride: 103 mmol/L (ref 96–106)
GFR, EST AFRICAN AMERICAN: 99 mL/min/{1.73_m2} (ref >59)
GFR, EST NON AFRICAN AMERICAN: 86 mL/min/{1.73_m2} (ref >59)
Glucose: 120 mg/dL — ABNORMAL HIGH (ref 65–99)
Potassium: 4.6 mmol/L (ref 3.5–5.2)
SODIUM: 144 mmol/L (ref 134–144)

## 2017-11-30 ENCOUNTER — Telehealth: Payer: Self-pay | Admitting: Internal Medicine

## 2017-11-30 ENCOUNTER — Telehealth: Payer: Self-pay | Admitting: *Deleted

## 2017-11-30 NOTE — Telephone Encounter (Signed)
Call placed to Pt.  Pt would like to reschedule TEE for end of October.  Call placed to scheduling.  Moved TEE from 11/5 to 12/09/2017 at 8:00 am.  Pt notified.

## 2017-11-30 NOTE — Telephone Encounter (Signed)
New Message   Patient is calling because he has some additional questions in reference to his upcoming procedure. Please call to discuss.

## 2017-11-30 NOTE — Telephone Encounter (Signed)
Pt contacted pre-catheterization scheduled at [facility] for: Wednesday December 02, 2017 8:30 AM Verified arrival time and place: [place] Entrance A at: 6:30 AM  No solid food after midnight prior to cath, clear liquids until 5 AM day of procedure. Verified no contrast allergy. Verified no diabetes medications.  AM meds can be  taken pre-cath with sip of water including: ASA 81 mg  Confirmed patient has responsible person to drive home post procedure and for 24 hours after you arrive home: yes

## 2017-12-02 ENCOUNTER — Encounter (HOSPITAL_COMMUNITY): Payer: Self-pay | Admitting: *Deleted

## 2017-12-02 ENCOUNTER — Encounter (HOSPITAL_COMMUNITY)
Admission: RE | Disposition: A | Discharge status: Home / Self Care | Payer: Self-pay | Source: Ambulatory Visit | Attending: Interventional Cardiology

## 2017-12-02 ENCOUNTER — Ambulatory Visit (HOSPITAL_COMMUNITY)
Admission: RE | Admit: 2017-12-02 | Discharge: 2017-12-02 | Disposition: A | Discharge status: Home / Self Care | Payer: 59 | Source: Ambulatory Visit | Attending: Interventional Cardiology | Admitting: Interventional Cardiology

## 2017-12-02 ENCOUNTER — Other Ambulatory Visit: Payer: Self-pay

## 2017-12-02 DIAGNOSIS — I2510 Atherosclerotic heart disease of native coronary artery without angina pectoris: Secondary | ICD-10-CM | POA: Diagnosis not present

## 2017-12-02 DIAGNOSIS — Z950 Presence of cardiac pacemaker: Secondary | ICD-10-CM | POA: Insufficient documentation

## 2017-12-02 DIAGNOSIS — I340 Nonrheumatic mitral (valve) insufficiency: Secondary | ICD-10-CM | POA: Insufficient documentation

## 2017-12-02 DIAGNOSIS — Z955 Presence of coronary angioplasty implant and graft: Secondary | ICD-10-CM | POA: Insufficient documentation

## 2017-12-02 DIAGNOSIS — I5021 Acute systolic (congestive) heart failure: Secondary | ICD-10-CM | POA: Diagnosis not present

## 2017-12-02 DIAGNOSIS — I493 Ventricular premature depolarization: Secondary | ICD-10-CM | POA: Diagnosis not present

## 2017-12-02 DIAGNOSIS — I110 Hypertensive heart disease with heart failure: Secondary | ICD-10-CM | POA: Insufficient documentation

## 2017-12-02 DIAGNOSIS — I4510 Unspecified right bundle-branch block: Secondary | ICD-10-CM | POA: Diagnosis not present

## 2017-12-02 HISTORY — PX: RIGHT/LEFT HEART CATH AND CORONARY ANGIOGRAPHY: CATH118266

## 2017-12-02 LAB — POCT I-STAT 3, ART BLOOD GAS (G3+)
Bicarbonate: 24.8 mmol/L (ref 20.0–28.0)
O2 SAT: 90 %
PH ART: 7.42 (ref 7.350–7.450)
TCO2: 26 mmol/L (ref 22–32)
pCO2 arterial: 38.2 mmHg (ref 32.0–48.0)
pO2, Arterial: 57 mmHg — ABNORMAL LOW (ref 83.0–108.0)

## 2017-12-02 LAB — POCT I-STAT 3, VENOUS BLOOD GAS (G3P V)
Bicarbonate: 25.1 mmol/L (ref 20.0–28.0)
O2 SAT: 70 %
PCO2 VEN: 42.6 mmHg — AB (ref 44.0–60.0)
PH VEN: 7.379 (ref 7.250–7.430)
PO2 VEN: 38 mmHg (ref 32.0–45.0)
TCO2: 26 mmol/L (ref 22–32)

## 2017-12-02 SURGERY — RIGHT/LEFT HEART CATH AND CORONARY ANGIOGRAPHY
Anesthesia: LOCAL

## 2017-12-02 MED ORDER — SODIUM CHLORIDE 0.9 % WEIGHT BASED INFUSION: 1.0000 mL/kg/h | INTRAVENOUS | Status: DC

## 2017-12-02 MED ORDER — SODIUM CHLORIDE 0.9 % IV SOLN: 250.0000 mL | INTRAVENOUS | Status: DC | PRN

## 2017-12-02 MED ORDER — IOHEXOL 350 MG/ML SOLN
INTRAVENOUS | Status: DC | PRN
  Administered 2017-12-02: 80 mL via INTRA_ARTERIAL

## 2017-12-02 MED ORDER — VERAPAMIL HCL 2.5 MG/ML IV SOLN
INTRAVENOUS | Status: DC | PRN
  Administered 2017-12-02 (×2): 10 mL via INTRA_ARTERIAL

## 2017-12-02 MED ORDER — MIDAZOLAM HCL 2 MG/2ML IJ SOLN
INTRAMUSCULAR | Status: AC
  Filled 2017-12-02: qty 2

## 2017-12-02 MED ORDER — ONDANSETRON HCL 4 MG/2ML IJ SOLN: 4.0000 mg | Freq: Four times a day (QID) | INTRAMUSCULAR | Status: DC | PRN

## 2017-12-02 MED ORDER — SODIUM CHLORIDE 0.9% FLUSH: 3.0000 mL | INTRAVENOUS | Status: DC | PRN

## 2017-12-02 MED ORDER — HEPARIN (PORCINE) IN NACL 1000-0.9 UT/500ML-% IV SOLN
INTRAVENOUS | Status: AC
  Filled 2017-12-02: qty 1000

## 2017-12-02 MED ORDER — ASPIRIN 81 MG PO CHEW: 81.0000 mg | CHEWABLE_TABLET | Freq: Once | ORAL | Status: DC

## 2017-12-02 MED ORDER — SODIUM CHLORIDE 0.9% FLUSH: 3.0000 mL | Freq: Two times a day (BID) | INTRAVENOUS | Status: DC

## 2017-12-02 MED ORDER — SODIUM CHLORIDE 0.9 % WEIGHT BASED INFUSION
3.0000 mL/kg/h | INTRAVENOUS | Status: AC
  Administered 2017-12-02: 3 mL/kg/h via INTRAVENOUS

## 2017-12-02 MED ORDER — LIDOCAINE HCL (PF) 1 % IJ SOLN
INTRAMUSCULAR | Status: AC
  Filled 2017-12-02: qty 30

## 2017-12-02 MED ORDER — FENTANYL CITRATE (PF) 100 MCG/2ML IJ SOLN
INTRAMUSCULAR | Status: AC
  Filled 2017-12-02: qty 2

## 2017-12-02 MED ORDER — LIDOCAINE HCL (PF) 1 % IJ SOLN
INTRAMUSCULAR | Status: DC | PRN
  Administered 2017-12-02: 2 mL
  Administered 2017-12-02: 1 mL

## 2017-12-02 MED ORDER — FENTANYL CITRATE (PF) 100 MCG/2ML IJ SOLN
INTRAMUSCULAR | Status: DC | PRN
  Administered 2017-12-02 (×2): 25 ug via INTRAVENOUS

## 2017-12-02 MED ORDER — HEPARIN SODIUM (PORCINE) 1000 UNIT/ML IJ SOLN
INTRAMUSCULAR | Status: DC | PRN
  Administered 2017-12-02: 4500 [IU] via INTRAVENOUS

## 2017-12-02 MED ORDER — MIDAZOLAM HCL 2 MG/2ML IJ SOLN
INTRAMUSCULAR | Status: DC | PRN
  Administered 2017-12-02 (×2): 1 mg via INTRAVENOUS

## 2017-12-02 MED ORDER — HEPARIN (PORCINE) IN NACL 1000-0.9 UT/500ML-% IV SOLN
INTRAVENOUS | Status: DC | PRN
  Administered 2017-12-02 (×3): 500 mL

## 2017-12-02 MED ORDER — NITROGLYCERIN 0.4 MG SL SUBL: 0.4000 mg | SUBLINGUAL_TABLET | SUBLINGUAL | 3 refills | Status: DC | PRN

## 2017-12-02 MED ORDER — HEPARIN SODIUM (PORCINE) 1000 UNIT/ML IJ SOLN
INTRAMUSCULAR | Status: AC
  Filled 2017-12-02: qty 1

## 2017-12-02 MED ORDER — ACETAMINOPHEN 325 MG PO TABS: 650.0000 mg | ORAL_TABLET | ORAL | Status: DC | PRN

## 2017-12-02 MED ORDER — HEPARIN (PORCINE) IN NACL 1000-0.9 UT/500ML-% IV SOLN
INTRAVENOUS | Status: AC
  Filled 2017-12-02: qty 500

## 2017-12-02 MED ORDER — SODIUM CHLORIDE 0.9 % IV SOLN: INTRAVENOUS | Status: DC

## 2017-12-02 MED ORDER — VERAPAMIL HCL 2.5 MG/ML IV SOLN
INTRAVENOUS | Status: AC
  Filled 2017-12-02: qty 2

## 2017-12-02 SURGICAL SUPPLY — 12 items
CATH 5FR JL3.5 JR4 ANG PIG MP (CATHETERS) ×2 IMPLANT
CATH BALLN WEDGE 5F 110CM (CATHETERS) ×2 IMPLANT
DEVICE RAD COMP TR BAND LRG (VASCULAR PRODUCTS) ×2 IMPLANT
GLIDESHEATH SLEND SS 6F .021 (SHEATH) ×2 IMPLANT
GUIDEWIRE INQWIRE 1.5J.035X260 (WIRE) ×1 IMPLANT
INQWIRE 1.5J .035X260CM (WIRE) ×2
KIT HEART LEFT (KITS) ×2 IMPLANT
PACK CARDIAC CATHETERIZATION (CUSTOM PROCEDURE TRAY) ×2 IMPLANT
SHEATH GLIDE SLENDER 4/5FR (SHEATH) ×2 IMPLANT
SYR MEDRAD MARK V 150ML (SYRINGE) ×2 IMPLANT
TRANSDUCER W/STOPCOCK (MISCELLANEOUS) ×2 IMPLANT
TUBING CIL FLEX 10 FLL-RA (TUBING) ×2 IMPLANT

## 2017-12-02 NOTE — Discharge Instructions (Signed)
Drink plenty of fluids for 48 hours.  Keep right wrist elevated at heart level for 24 hours.  Radial Site Care Refer to this sheet in the next few weeks. These instructions provide you with information about caring for yourself after your procedure. Your health care provider may also give you more specific instructions. Your treatment has been planned according to current medical practices, but problems sometimes occur. Call your health care provider if you have any problems or questions after your procedure. What can I expect after the procedure? After your procedure, it is typical to have the following:  Bruising at the radial site that usually fades within 1-2 weeks.  Blood collecting in the tissue (hematoma) that may be painful to the touch. It should usually decrease in size and tenderness within 1-2 weeks.  Follow these instructions at home:  Take medicines only as directed by your health care provider.  You may shower 24-48 hours after the procedure or as directed by your health care provider. Remove the bandage (dressing) and gently wash the site with plain soap and water. Pat the area dry with a clean towel. Do not rub the site, because this may cause bleeding.  Do not take baths, swim, or use a hot tub until your health care provider approves.  Check your insertion site every day for redness, swelling, or drainage.  Do not apply powder or lotion to the site.  Do not flex or bend the affected arm for 24 hours or as directed by your health care provider.  Do not push or pull heavy objects with the affected arm for 24 hours or as directed by your health care provider.  Do not lift over 10 lb (4.5 kg) for 5 days after your procedure or as directed by your health care provider.  Ask your health care provider when it is okay to: ? Return to work or school. ? Resume usual physical activities or sports. ? Resume sexual activity.  Do not drive home if you are discharged the same  day as the procedure. Have someone else drive you.  You may drive 24 hours after the procedure unless otherwise instructed by your health care provider.  Do not operate machinery or power tools for 24 hours after the procedure.  If your procedure was done as an outpatient procedure, which means that you went home the same day as your procedure, a responsible adult should be with you for the first 24 hours after you arrive home.  Keep all follow-up visits as directed by your health care provider. This is important. Contact a health care provider if:  You have a fever.  You have chills.  You have increased bleeding from the radial site. Hold pressure on the site. Get help right away if:  You have unusual pain at the radial site.  You have redness, warmth, or swelling at the radial site.  You have drainage (other than a small amount of blood on the dressing) from the radial site.  The radial site is bleeding, and the bleeding does not stop after 30 minutes of holding steady pressure on the site.  Your arm or hand becomes pale, cool, tingly, or numb. This information is not intended to replace advice given to you by your health care provider. Make sure you discuss any questions you have with your health care provider. Document Released: 03/01/2010 Document Revised: 07/05/2015 Document Reviewed: 08/15/2013 Elsevier Interactive Patient Education  2018 [employer].

## 2017-12-02 NOTE — Interval H&P Note (Signed)
Cath Lab Visit (complete for each Cath Lab visit)  Clinical Evaluation Leading to the Procedure:   ACS: No.  Non-ACS:    Anginal Classification: CCS III  Anti-ischemic medical therapy: Minimal Therapy (1 class of medications)  Non-Invasive Test Results: Intermediate-risk stress test findings: cardiac mortality 1-3%/year  Prior CABG: No previous CABG  Significant MR.  Plan for diagnostic cath.  Discussed with Dr. Rayann Heman.    History and Physical Interval Note:  12/02/2017 8:43 AM  Tyler Hendrix  has presented today for surgery, with the diagnosis of cardiomyopathy  The various methods of treatment have been discussed with the patient and family. After consideration of risks, benefits and other options for treatment, the patient has consented to  Procedure(s): RIGHT/LEFT HEART CATH AND CORONARY ANGIOGRAPHY (N/A) as a surgical intervention .  The patient's history has been reviewed, patient examined, no change in status, stable for surgery.  I have reviewed the patient's chart and labs.  Questions were answered to the patient's satisfaction.     Larae Grooms

## 2017-12-09 ENCOUNTER — Other Ambulatory Visit: Payer: Self-pay

## 2017-12-09 ENCOUNTER — Ambulatory Visit (HOSPITAL_BASED_OUTPATIENT_CLINIC_OR_DEPARTMENT_OTHER): Payer: 59

## 2017-12-09 ENCOUNTER — Encounter (HOSPITAL_COMMUNITY): Payer: Self-pay

## 2017-12-09 ENCOUNTER — Ambulatory Visit (HOSPITAL_COMMUNITY)
Admission: RE | Admit: 2017-12-09 | Discharge: 2017-12-09 | Disposition: A | Discharge status: Home / Self Care | Payer: 59 | Source: Ambulatory Visit | Attending: Cardiology | Admitting: Cardiology

## 2017-12-09 ENCOUNTER — Encounter (HOSPITAL_COMMUNITY)
Admission: RE | Disposition: A | Discharge status: Home / Self Care | Payer: Self-pay | Source: Ambulatory Visit | Attending: Cardiology

## 2017-12-09 DIAGNOSIS — I340 Nonrheumatic mitral (valve) insufficiency: Secondary | ICD-10-CM | POA: Diagnosis not present

## 2017-12-09 DIAGNOSIS — Z79899 Other long term (current) drug therapy: Secondary | ICD-10-CM | POA: Insufficient documentation

## 2017-12-09 DIAGNOSIS — Z955 Presence of coronary angioplasty implant and graft: Secondary | ICD-10-CM | POA: Insufficient documentation

## 2017-12-09 DIAGNOSIS — R0602 Shortness of breath: Secondary | ICD-10-CM | POA: Diagnosis not present

## 2017-12-09 DIAGNOSIS — Z9889 Other specified postprocedural states: Secondary | ICD-10-CM | POA: Diagnosis not present

## 2017-12-09 DIAGNOSIS — I420 Dilated cardiomyopathy: Secondary | ICD-10-CM | POA: Insufficient documentation

## 2017-12-09 DIAGNOSIS — Z79890 Hormone replacement therapy (postmenopausal): Secondary | ICD-10-CM | POA: Diagnosis not present

## 2017-12-09 DIAGNOSIS — I2510 Atherosclerotic heart disease of native coronary artery without angina pectoris: Secondary | ICD-10-CM | POA: Insufficient documentation

## 2017-12-09 DIAGNOSIS — I10 Essential (primary) hypertension: Secondary | ICD-10-CM | POA: Diagnosis not present

## 2017-12-09 DIAGNOSIS — I493 Ventricular premature depolarization: Secondary | ICD-10-CM | POA: Insufficient documentation

## 2017-12-09 DIAGNOSIS — I051 Rheumatic mitral insufficiency: Secondary | ICD-10-CM | POA: Insufficient documentation

## 2017-12-09 DIAGNOSIS — Z8571 Personal history of Hodgkin lymphoma: Secondary | ICD-10-CM | POA: Insufficient documentation

## 2017-12-09 DIAGNOSIS — Z950 Presence of cardiac pacemaker: Secondary | ICD-10-CM | POA: Insufficient documentation

## 2017-12-09 HISTORY — PX: TEE WITHOUT CARDIOVERSION: SHX5443

## 2017-12-09 SURGERY — ECHOCARDIOGRAM, TRANSESOPHAGEAL
Anesthesia: Moderate Sedation

## 2017-12-09 MED ORDER — MIDAZOLAM HCL 5 MG/ML IJ SOLN
INTRAMUSCULAR | Status: AC
  Filled 2017-12-09: qty 2

## 2017-12-09 MED ORDER — SODIUM CHLORIDE 0.9 % IV SOLN
INTRAVENOUS | Status: DC
  Administered 2017-12-09: 08:00:00 via INTRAVENOUS

## 2017-12-09 MED ORDER — FENTANYL CITRATE (PF) 100 MCG/2ML IJ SOLN
INTRAMUSCULAR | Status: AC
  Filled 2017-12-09: qty 2

## 2017-12-09 MED ORDER — DIPHENHYDRAMINE HCL 50 MG/ML IJ SOLN
INTRAMUSCULAR | Status: AC
  Filled 2017-12-09: qty 1

## 2017-12-09 MED ORDER — FENTANYL CITRATE (PF) 100 MCG/2ML IJ SOLN
INTRAMUSCULAR | Status: DC | PRN
  Administered 2017-12-09 (×3): 25 ug via INTRAVENOUS

## 2017-12-09 MED ORDER — BUTAMBEN-TETRACAINE-BENZOCAINE 2-2-14 % EX AERO
INHALATION_SPRAY | CUTANEOUS | Status: DC | PRN
  Administered 2017-12-09: 2 via TOPICAL

## 2017-12-09 MED ORDER — MIDAZOLAM HCL 10 MG/2ML IJ SOLN
INTRAMUSCULAR | Status: DC | PRN
  Administered 2017-12-09: 2 mg via INTRAVENOUS
  Administered 2017-12-09: 1 mg via INTRAVENOUS
  Administered 2017-12-09 (×2): 2 mg via INTRAVENOUS

## 2017-12-09 NOTE — CV Procedure (Signed)
Brief TEE note, full report to follow in [facility]  Moderate LV dysfunction, EF 40-45% with wall motion abnormalities of the distal anterior, anteroseptal, and apical walls.  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 MR ERO max 0.67 cm3, MR RV 113 ml.   During this procedure the patient is administered a total of Versed 7 mg and Fentanyl 75 mcg to achieve and maintain moderate conscious sedation.  The patient's heart rate, blood pressure, and oxygen saturation are monitored continuously during the procedure. The period of conscious sedation is 28 minutes, of which I was present face-to-face 100% of this time.  Buford Dresser, MD, PhD [facility]  [address] [place], [place] [address] [phone]

## 2017-12-09 NOTE — Progress Notes (Signed)
  Echocardiogram Echocardiogram Transesophageal has been performed.  Tyler Hendrix 12/09/2017, 8:46 AM

## 2017-12-09 NOTE — Discharge Instructions (Signed)

## 2017-12-09 NOTE — Interval H&P Note (Signed)
History and Physical Interval Note:  12/09/2017 7:58 AM  Tyler Hendrix  has presented today for surgery, with the diagnosis of MITRAL REGURGITATION  The various methods of treatment have been discussed with the patient and family. After consideration of risks, benefits and other options for treatment, the patient has consented to  Procedure(s): TRANSESOPHAGEAL ECHOCARDIOGRAM (TEE) (N/A) as a surgical intervention .  The patient's history has been reviewed, patient examined, no change in status, stable for surgery.  I have reviewed the patient's chart and labs.  Questions were answered to the patient's satisfaction.     Bailey Faiella Harrell Gave

## 2017-12-09 NOTE — Progress Notes (Deleted)
  Echocardiogram 2D Echocardiogram has been performed.  Tyler Hendrix 12/09/2017, 8:45 AM

## 2017-12-15 ENCOUNTER — Telehealth: Payer: Self-pay | Admitting: Internal Medicine

## 2017-12-15 DIAGNOSIS — I340 Nonrheumatic mitral (valve) insufficiency: Secondary | ICD-10-CM

## 2017-12-15 DIAGNOSIS — I2510 Atherosclerotic heart disease of native coronary artery without angina pectoris: Secondary | ICD-10-CM

## 2017-12-15 NOTE — Telephone Encounter (Addendum)
Pt called and would like for [place] to call him after Dr. Rayann Heman is finished reviewing his TEE results...advised pt that we will call him back with his recommendations.

## 2017-12-15 NOTE — Telephone Encounter (Signed)
New Message   Pt is calling to check on the result to his TEE

## 2017-12-16 LAB — CUP PACEART INCLINIC DEVICE CHECK
Brady Statistic AP VS Percent: 0.1 % — CL
Brady Statistic AS VP Percent: 59.9 %
Brady Statistic AS VS Percent: 0.1 %
Date Time Interrogation Session: 20191106135105
Implantable Lead Implant Date: 20170922
Implantable Lead Location: 753860
Implantable Lead Model: 5076
Implantable Lead Model: 5076
Lead Channel Impedance Value: 570 Ohm
Lead Channel Pacing Threshold Amplitude: 0.75 V
Lead Channel Pacing Threshold Pulse Width: 0.4 ms
Lead Channel Sensing Intrinsic Amplitude: 1.3 mV
Lead Channel Sensing Intrinsic Amplitude: 10.5 mV
Lead Channel Setting Pacing Amplitude: 2 V
MDC IDC LEAD IMPLANT DT: 20170922
MDC IDC LEAD LOCATION: 753859
MDC IDC MSMT BATTERY REMAINING LONGEVITY: 90 mo
MDC IDC MSMT BATTERY VOLTAGE: 3.01 V
MDC IDC MSMT LEADCHNL RA IMPEDANCE VALUE: 475 Ohm
MDC IDC MSMT LEADCHNL RA PACING THRESHOLD PULSEWIDTH: 0.4 ms
MDC IDC MSMT LEADCHNL RV PACING THRESHOLD AMPLITUDE: 0.75 V
MDC IDC PG IMPLANT DT: 20170922
MDC IDC SET LEADCHNL RV PACING AMPLITUDE: 2.5 V
MDC IDC SET LEADCHNL RV PACING PULSEWIDTH: 0.4 ms
MDC IDC SET LEADCHNL RV SENSING SENSITIVITY: 2 mV
MDC IDC STAT BRADY AP VP PERCENT: 39.9 %

## 2017-12-16 NOTE — Telephone Encounter (Signed)
He has severe MR, afib, and CAD. Please refer to Dr Roxy Manns to discuss surgical option. Follow-up with patient.

## 2017-12-17 ENCOUNTER — Telehealth: Payer: Self-pay | Admitting: Internal Medicine

## 2017-12-17 NOTE — Telephone Encounter (Signed)
See previous phone note.  

## 2017-12-17 NOTE — Telephone Encounter (Signed)
I spoke with pt and gave him information from Dr. Rayann Heman. Pt would like to proceed with referral to Dr. Roxy Manns.  Referral placed.

## 2017-12-17 NOTE — Telephone Encounter (Signed)
Left message to call back  

## 2017-12-17 NOTE — Telephone Encounter (Signed)
New Message ° ° ° ° ° ° ° ° ° °Patient returned your call, would like a call back. °

## 2017-12-18 ENCOUNTER — Encounter: Payer: Self-pay | Admitting: Thoracic Surgery (Cardiothoracic Vascular Surgery)

## 2017-12-30 ENCOUNTER — Encounter: Payer: 59 | Admitting: Thoracic Surgery (Cardiothoracic Vascular Surgery)

## 2018-01-01 ENCOUNTER — Encounter: Payer: 59 | Admitting: Thoracic Surgery (Cardiothoracic Vascular Surgery)

## 2018-01-01 ENCOUNTER — Encounter: Payer: Self-pay | Admitting: Thoracic Surgery (Cardiothoracic Vascular Surgery)

## 2018-01-04 ENCOUNTER — Other Ambulatory Visit: Payer: Self-pay

## 2018-01-04 ENCOUNTER — Other Ambulatory Visit: Payer: Self-pay | Admitting: *Deleted

## 2018-01-04 ENCOUNTER — Institutional Professional Consult (permissible substitution): Payer: 59 | Admitting: Thoracic Surgery (Cardiothoracic Vascular Surgery)

## 2018-01-04 ENCOUNTER — Encounter: Payer: Self-pay | Admitting: Thoracic Surgery (Cardiothoracic Vascular Surgery)

## 2018-01-04 VITALS — BP 156/84 | HR 59 | Resp 16 | Ht 71.0 in | Wt 185.0 lb

## 2018-01-04 DIAGNOSIS — I2510 Atherosclerotic heart disease of native coronary artery without angina pectoris: Secondary | ICD-10-CM

## 2018-01-04 DIAGNOSIS — I340 Nonrheumatic mitral (valve) insufficiency: Secondary | ICD-10-CM

## 2018-01-04 DIAGNOSIS — I7409 Other arterial embolism and thrombosis of abdominal aorta: Secondary | ICD-10-CM

## 2018-01-04 DIAGNOSIS — Z955 Presence of coronary angioplasty implant and graft: Secondary | ICD-10-CM

## 2018-01-04 DIAGNOSIS — Z01818 Encounter for other preprocedural examination: Secondary | ICD-10-CM

## 2018-01-04 NOTE — Patient Instructions (Addendum)
Avoid any strenuous exertion between now and the time of surgery.  Call your cardiologist and/or go to the ED if you develop and chest pain or chest tightness that persists more than a few minutes  Continue taking all current medications without change through the day before surgery.  Have nothing to eat or drink after midnight the night before surgery.  On the morning of surgery take only Synthroid with a sip of water.

## 2018-01-04 NOTE — Progress Notes (Signed)
PW A Wave 16 mmHg  PW V Wave 18 mmHg  PW Mean 13 mmHg  AO Systolic Pressure 062 mmHg  AO Diastolic Pressure 68 mmHg  AO Mean 92 mmHg  LV Systolic Pressure 694 mmHg  LV Diastolic Pressure 8 mmHg  LV EDP 16 mmHg  AOp Systolic Pressure 854 mmHg  AOp Diastolic Pressure 73 mmHg  AOp Mean Pressure 95 mmHg  LVp Systolic Pressure 627 mmHg  LVp Diastolic Pressure 13 mmHg  LVp EDP Pressure 20 mmHg  QP/QS 1  TPVR Index 6.28 HRUI  TSVR Index 28.69 HRUI  PVR SVR Ratio 0.09  TPVR/TSVR Ratio 0.22    Transesophageal Echocardiography  Patient:    Tyler Hendrix, Tyler Hendrix MR #:       [id number] Study Date: 12/09/2017 Gender:     M Age:         62 Height:     180.3 cm Weight:     85.7 kg BSA:        2.08 m^2 Pt. Status: Room:   SONOGRAPHER  Johny Chess, RDCS, CCT  ORDERING     Skeet Latch, MD  ADMITTING    Harrell Gave, Bridgette  ATTENDING    Harrell Gave, Bridgette  PERFORMING   Harrell Gave, Bridgette  cc:  ------------------------------------------------------------------- LV EF: 40% -   45%  ------------------------------------------------------------------- Indications:      Mitral regurgitation 424.0. Severe valvular disease  ------------------------------------------------------------------- Study Conclusions  - Left ventricle: The cavity size was moderately dilated. Systolic   function was mildly to moderately reduced. The estimated ejection   fraction was in the range of 40% to 45%. Hypokinesis of the   anteroseptal myocardium. Hypokinesis of the anterior myocardium.   Hypokinesis of the apical myocardium. - Mitral valve: Moderately dilated annulus. Normal thickness   leaflets . There was malcoaptation of the valve leaflets. There   was severe regurgitation. Effective regurgitant orifice (PISA):   0.67 cm^2. Regurgitant volume (PISA): 113 ml.  Impressions:  - Moderately reduced LV function with moderately dilated LV and   anterior/anteroseptal/apical hypokinesis. 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  ------------------------------------------------------------------- Labs, prior tests, procedures, and surgery: Permanent pacemaker system implantation.  ------------------------------------------------------------------- Study data:   Study status:  Routine.  Consent:  The risks, benefits, and alternatives to the procedure were explained to the patient and informed consent was obtained.  Procedure:  Initial setup. The patient was brought to the laboratory. Surface ECG leads were monitored. Sedation. Conscious sedation was administered by cardiology staff.  Transesophageal echocardiography. A 3rd probe transesophageal probe was inserted by the attending cardiologistwithout difficulty. Image quality was adequate.  Study completion:  The patient tolerated the procedure well. There were no complications.  Administered medications:   Midazolam, 7mg , IV. Fentanyl, 2mcg, IV.          Diagnostic transesophageal echocardiography.  2D and color Doppler.  Birthdate:  Patient birthdate: [birth year].  Age:  Patient is 62 yr old.  Sex:  Gender: male.    BMI: 26.4 kg/m^2.  Blood pressure:     158/76  Patient status:  Outpatient.  Study date:  Study date: 12/09/2017. Study time: 07:57 AM.  Location:  Endoscopy.  -------------------------------------------------------------------  ------------------------------------------------------------------- Left ventricle:  The cavity size was moderately dilated. Systolic function was mildly to moderately reduced. The estimated ejection fraction was in the range of 40% to 45%.  Regional wall motion abnormalities:   Hypokinesis of the anteroseptal myocardium. Hypokinesis of the anterior myocardium.  Hypokinesis of the apical myocardium.  ------------------------------------------------------------------- Aortic valve:  function. Mild   pulmonary hypertension. Biatrial enlargement. There was moderate   to severe mitral regurgitation with a degree of posterior  leaflet   restriction.  ------------------------------------------------------------------- Labs, prior tests, procedures, and surgery: Permanent pacemaker system implantation.  ------------------------------------------------------------------- Study data:  Comparison was made to the study of 10/30/2015.  Study status:  Routine.  Procedure:  Transthoracic echocardiography. Image quality was adequate.          Transthoracic echocardiography.  M-mode, complete 2D, 3D, spectral Doppler, and color Doppler.  Birthdate:  Patient birthdate: [birth year].  Age: Patient is 62 yr old.  Sex:  Gender: male.    BMI: 26 kg/m^2. Blood pressure:     138/82  Patient status:  Outpatient.  Study date:  Study date: 11/25/2017. Study time: 08:37 AM.  Location: [facility]  -------------------------------------------------------------------  ------------------------------------------------------------------- Left ventricle:  The cavity size was mildly dilated. Wall thickness was increased in a pattern of mild LVH. Severe hypokinesis of the anteroseptal wall and the apex. The estimated ejection fraction was 40%. Features are consistent with a pseudonormal left ventricular filling pattern, with concomitant abnormal relaxation and increased filling pressure (grade 2 diastolic dysfunction).  ------------------------------------------------------------------- Aortic valve:   Trileaflet; mildly calcified leaflets.  Doppler: There was no stenosis.   There was no regurgitation.  ------------------------------------------------------------------- Aorta:  Aortic root: The aortic root was normal in size. Ascending aorta: The ascending aorta was normal in size.  ------------------------------------------------------------------- Mitral valve:   Mildly calcified annulus.  Doppler:   There was no evidence for stenosis.   There was moderate to severe regurgitation.    Valve area by pressure half-time:  1.85 cm^2. Indexed valve area by pressure half-time: 0.89 cm^2/m^2. Indexed valve area by continuity equation (using LVOT flow): 1.32 cm^2/m^2.    Mean gradient (D): 2 mm Hg. Peak gradient (D): 8 mm Hg.  ------------------------------------------------------------------- Left atrium:  The atrium was moderately to severely dilated.   ------------------------------------------------------------------- Right ventricle:  The cavity size was normal. Pacer wire or catheter noted in right ventricle. Systolic function was normal.   ------------------------------------------------------------------- Pulmonic valve:    Structurally normal valve.   Cusp separation was normal.  Doppler:  Transvalvular velocity was within the normal range. There was trivial regurgitation.  ------------------------------------------------------------------- Tricuspid valve:   Doppler:  There was trivial regurgitation.   ------------------------------------------------------------------- Right atrium:  The atrium was mildly dilated.  ------------------------------------------------------------------- Pericardium:  A trivial pericardial effusion was identified.  ------------------------------------------------------------------- Systemic veins: Inferior vena cava: The vessel was normal in size. The respirophasic diameter changes were in the normal range (= 50%), consistent with normal central venous pressure.  ------------------------------------------------------------------- Measurements   Left ventricle                           Value          Reference  LV ID, ED, PLAX chordal          (H)     59.6  mm       43 - 52  LV ID, ES, PLAX chordal          (H)     41.7  mm       23 - 38  LV fx shortening, PLAX chordal           30    %        >=29  LV PW thickness, ED  function. Mild   pulmonary hypertension. Biatrial enlargement. There was moderate   to severe mitral regurgitation with a degree of posterior  leaflet   restriction.  ------------------------------------------------------------------- Labs, prior tests, procedures, and surgery: Permanent pacemaker system implantation.  ------------------------------------------------------------------- Study data:  Comparison was made to the study of 10/30/2015.  Study status:  Routine.  Procedure:  Transthoracic echocardiography. Image quality was adequate.          Transthoracic echocardiography.  M-mode, complete 2D, 3D, spectral Doppler, and color Doppler.  Birthdate:  Patient birthdate: [birth year].  Age: Patient is 62 yr old.  Sex:  Gender: male.    BMI: 26 kg/m^2. Blood pressure:     138/82  Patient status:  Outpatient.  Study date:  Study date: 11/25/2017. Study time: 08:37 AM.  Location: [facility]  -------------------------------------------------------------------  ------------------------------------------------------------------- Left ventricle:  The cavity size was mildly dilated. Wall thickness was increased in a pattern of mild LVH. Severe hypokinesis of the anteroseptal wall and the apex. The estimated ejection fraction was 40%. Features are consistent with a pseudonormal left ventricular filling pattern, with concomitant abnormal relaxation and increased filling pressure (grade 2 diastolic dysfunction).  ------------------------------------------------------------------- Aortic valve:   Trileaflet; mildly calcified leaflets.  Doppler: There was no stenosis.   There was no regurgitation.  ------------------------------------------------------------------- Aorta:  Aortic root: The aortic root was normal in size. Ascending aorta: The ascending aorta was normal in size.  ------------------------------------------------------------------- Mitral valve:   Mildly calcified annulus.  Doppler:   There was no evidence for stenosis.   There was moderate to severe regurgitation.    Valve area by pressure half-time:  1.85 cm^2. Indexed valve area by pressure half-time: 0.89 cm^2/m^2. Indexed valve area by continuity equation (using LVOT flow): 1.32 cm^2/m^2.    Mean gradient (D): 2 mm Hg. Peak gradient (D): 8 mm Hg.  ------------------------------------------------------------------- Left atrium:  The atrium was moderately to severely dilated.   ------------------------------------------------------------------- Right ventricle:  The cavity size was normal. Pacer wire or catheter noted in right ventricle. Systolic function was normal.   ------------------------------------------------------------------- Pulmonic valve:    Structurally normal valve.   Cusp separation was normal.  Doppler:  Transvalvular velocity was within the normal range. There was trivial regurgitation.  ------------------------------------------------------------------- Tricuspid valve:   Doppler:  There was trivial regurgitation.   ------------------------------------------------------------------- Right atrium:  The atrium was mildly dilated.  ------------------------------------------------------------------- Pericardium:  A trivial pericardial effusion was identified.  ------------------------------------------------------------------- Systemic veins: Inferior vena cava: The vessel was normal in size. The respirophasic diameter changes were in the normal range (= 50%), consistent with normal central venous pressure.  ------------------------------------------------------------------- Measurements   Left ventricle                           Value          Reference  LV ID, ED, PLAX chordal          (H)     59.6  mm       43 - 52  LV ID, ES, PLAX chordal          (H)     41.7  mm       23 - 38  LV fx shortening, PLAX chordal           30    %        >=29  LV PW thickness, ED  function. Mild   pulmonary hypertension. Biatrial enlargement. There was moderate   to severe mitral regurgitation with a degree of posterior  leaflet   restriction.  ------------------------------------------------------------------- Labs, prior tests, procedures, and surgery: Permanent pacemaker system implantation.  ------------------------------------------------------------------- Study data:  Comparison was made to the study of 10/30/2015.  Study status:  Routine.  Procedure:  Transthoracic echocardiography. Image quality was adequate.          Transthoracic echocardiography.  M-mode, complete 2D, 3D, spectral Doppler, and color Doppler.  Birthdate:  Patient birthdate: [birth year].  Age: Patient is 62 yr old.  Sex:  Gender: male.    BMI: 26 kg/m^2. Blood pressure:     138/82  Patient status:  Outpatient.  Study date:  Study date: 11/25/2017. Study time: 08:37 AM.  Location: [facility]  -------------------------------------------------------------------  ------------------------------------------------------------------- Left ventricle:  The cavity size was mildly dilated. Wall thickness was increased in a pattern of mild LVH. Severe hypokinesis of the anteroseptal wall and the apex. The estimated ejection fraction was 40%. Features are consistent with a pseudonormal left ventricular filling pattern, with concomitant abnormal relaxation and increased filling pressure (grade 2 diastolic dysfunction).  ------------------------------------------------------------------- Aortic valve:   Trileaflet; mildly calcified leaflets.  Doppler: There was no stenosis.   There was no regurgitation.  ------------------------------------------------------------------- Aorta:  Aortic root: The aortic root was normal in size. Ascending aorta: The ascending aorta was normal in size.  ------------------------------------------------------------------- Mitral valve:   Mildly calcified annulus.  Doppler:   There was no evidence for stenosis.   There was moderate to severe regurgitation.    Valve area by pressure half-time:  1.85 cm^2. Indexed valve area by pressure half-time: 0.89 cm^2/m^2. Indexed valve area by continuity equation (using LVOT flow): 1.32 cm^2/m^2.    Mean gradient (D): 2 mm Hg. Peak gradient (D): 8 mm Hg.  ------------------------------------------------------------------- Left atrium:  The atrium was moderately to severely dilated.   ------------------------------------------------------------------- Right ventricle:  The cavity size was normal. Pacer wire or catheter noted in right ventricle. Systolic function was normal.   ------------------------------------------------------------------- Pulmonic valve:    Structurally normal valve.   Cusp separation was normal.  Doppler:  Transvalvular velocity was within the normal range. There was trivial regurgitation.  ------------------------------------------------------------------- Tricuspid valve:   Doppler:  There was trivial regurgitation.   ------------------------------------------------------------------- Right atrium:  The atrium was mildly dilated.  ------------------------------------------------------------------- Pericardium:  A trivial pericardial effusion was identified.  ------------------------------------------------------------------- Systemic veins: Inferior vena cava: The vessel was normal in size. The respirophasic diameter changes were in the normal range (= 50%), consistent with normal central venous pressure.  ------------------------------------------------------------------- Measurements   Left ventricle                           Value          Reference  LV ID, ED, PLAX chordal          (H)     59.6  mm       43 - 52  LV ID, ES, PLAX chordal          (H)     41.7  mm       23 - 38  LV fx shortening, PLAX chordal           30    %        >=29  LV PW thickness, ED  function. Mild   pulmonary hypertension. Biatrial enlargement. There was moderate   to severe mitral regurgitation with a degree of posterior  leaflet   restriction.  ------------------------------------------------------------------- Labs, prior tests, procedures, and surgery: Permanent pacemaker system implantation.  ------------------------------------------------------------------- Study data:  Comparison was made to the study of 10/30/2015.  Study status:  Routine.  Procedure:  Transthoracic echocardiography. Image quality was adequate.          Transthoracic echocardiography.  M-mode, complete 2D, 3D, spectral Doppler, and color Doppler.  Birthdate:  Patient birthdate: [birth year].  Age: Patient is 62 yr old.  Sex:  Gender: male.    BMI: 26 kg/m^2. Blood pressure:     138/82  Patient status:  Outpatient.  Study date:  Study date: 11/25/2017. Study time: 08:37 AM.  Location: [facility]  -------------------------------------------------------------------  ------------------------------------------------------------------- Left ventricle:  The cavity size was mildly dilated. Wall thickness was increased in a pattern of mild LVH. Severe hypokinesis of the anteroseptal wall and the apex. The estimated ejection fraction was 40%. Features are consistent with a pseudonormal left ventricular filling pattern, with concomitant abnormal relaxation and increased filling pressure (grade 2 diastolic dysfunction).  ------------------------------------------------------------------- Aortic valve:   Trileaflet; mildly calcified leaflets.  Doppler: There was no stenosis.   There was no regurgitation.  ------------------------------------------------------------------- Aorta:  Aortic root: The aortic root was normal in size. Ascending aorta: The ascending aorta was normal in size.  ------------------------------------------------------------------- Mitral valve:   Mildly calcified annulus.  Doppler:   There was no evidence for stenosis.   There was moderate to severe regurgitation.    Valve area by pressure half-time:  1.85 cm^2. Indexed valve area by pressure half-time: 0.89 cm^2/m^2. Indexed valve area by continuity equation (using LVOT flow): 1.32 cm^2/m^2.    Mean gradient (D): 2 mm Hg. Peak gradient (D): 8 mm Hg.  ------------------------------------------------------------------- Left atrium:  The atrium was moderately to severely dilated.   ------------------------------------------------------------------- Right ventricle:  The cavity size was normal. Pacer wire or catheter noted in right ventricle. Systolic function was normal.   ------------------------------------------------------------------- Pulmonic valve:    Structurally normal valve.   Cusp separation was normal.  Doppler:  Transvalvular velocity was within the normal range. There was trivial regurgitation.  ------------------------------------------------------------------- Tricuspid valve:   Doppler:  There was trivial regurgitation.   ------------------------------------------------------------------- Right atrium:  The atrium was mildly dilated.  ------------------------------------------------------------------- Pericardium:  A trivial pericardial effusion was identified.  ------------------------------------------------------------------- Systemic veins: Inferior vena cava: The vessel was normal in size. The respirophasic diameter changes were in the normal range (= 50%), consistent with normal central venous pressure.  ------------------------------------------------------------------- Measurements   Left ventricle                           Value          Reference  LV ID, ED, PLAX chordal          (H)     59.6  mm       43 - 52  LV ID, ES, PLAX chordal          (H)     41.7  mm       23 - 38  LV fx shortening, PLAX chordal           30    %        >=29  LV PW thickness, ED  function. Mild   pulmonary hypertension. Biatrial enlargement. There was moderate   to severe mitral regurgitation with a degree of posterior  leaflet   restriction.  ------------------------------------------------------------------- Labs, prior tests, procedures, and surgery: Permanent pacemaker system implantation.  ------------------------------------------------------------------- Study data:  Comparison was made to the study of 10/30/2015.  Study status:  Routine.  Procedure:  Transthoracic echocardiography. Image quality was adequate.          Transthoracic echocardiography.  M-mode, complete 2D, 3D, spectral Doppler, and color Doppler.  Birthdate:  Patient birthdate: [birth year].  Age: Patient is 62 yr old.  Sex:  Gender: male.    BMI: 26 kg/m^2. Blood pressure:     138/82  Patient status:  Outpatient.  Study date:  Study date: 11/25/2017. Study time: 08:37 AM.  Location: [facility]  -------------------------------------------------------------------  ------------------------------------------------------------------- Left ventricle:  The cavity size was mildly dilated. Wall thickness was increased in a pattern of mild LVH. Severe hypokinesis of the anteroseptal wall and the apex. The estimated ejection fraction was 40%. Features are consistent with a pseudonormal left ventricular filling pattern, with concomitant abnormal relaxation and increased filling pressure (grade 2 diastolic dysfunction).  ------------------------------------------------------------------- Aortic valve:   Trileaflet; mildly calcified leaflets.  Doppler: There was no stenosis.   There was no regurgitation.  ------------------------------------------------------------------- Aorta:  Aortic root: The aortic root was normal in size. Ascending aorta: The ascending aorta was normal in size.  ------------------------------------------------------------------- Mitral valve:   Mildly calcified annulus.  Doppler:   There was no evidence for stenosis.   There was moderate to severe regurgitation.    Valve area by pressure half-time:  1.85 cm^2. Indexed valve area by pressure half-time: 0.89 cm^2/m^2. Indexed valve area by continuity equation (using LVOT flow): 1.32 cm^2/m^2.    Mean gradient (D): 2 mm Hg. Peak gradient (D): 8 mm Hg.  ------------------------------------------------------------------- Left atrium:  The atrium was moderately to severely dilated.   ------------------------------------------------------------------- Right ventricle:  The cavity size was normal. Pacer wire or catheter noted in right ventricle. Systolic function was normal.   ------------------------------------------------------------------- Pulmonic valve:    Structurally normal valve.   Cusp separation was normal.  Doppler:  Transvalvular velocity was within the normal range. There was trivial regurgitation.  ------------------------------------------------------------------- Tricuspid valve:   Doppler:  There was trivial regurgitation.   ------------------------------------------------------------------- Right atrium:  The atrium was mildly dilated.  ------------------------------------------------------------------- Pericardium:  A trivial pericardial effusion was identified.  ------------------------------------------------------------------- Systemic veins: Inferior vena cava: The vessel was normal in size. The respirophasic diameter changes were in the normal range (= 50%), consistent with normal central venous pressure.  ------------------------------------------------------------------- Measurements   Left ventricle                           Value          Reference  LV ID, ED, PLAX chordal          (H)     59.6  mm       43 - 52  LV ID, ES, PLAX chordal          (H)     41.7  mm       23 - 38  LV fx shortening, PLAX chordal           30    %        >=29  LV PW thickness, ED  function. Mild   pulmonary hypertension. Biatrial enlargement. There was moderate   to severe mitral regurgitation with a degree of posterior  leaflet   restriction.  ------------------------------------------------------------------- Labs, prior tests, procedures, and surgery: Permanent pacemaker system implantation.  ------------------------------------------------------------------- Study data:  Comparison was made to the study of 10/30/2015.  Study status:  Routine.  Procedure:  Transthoracic echocardiography. Image quality was adequate.          Transthoracic echocardiography.  M-mode, complete 2D, 3D, spectral Doppler, and color Doppler.  Birthdate:  Patient birthdate: [birth year].  Age: Patient is 62 yr old.  Sex:  Gender: male.    BMI: 26 kg/m^2. Blood pressure:     138/82  Patient status:  Outpatient.  Study date:  Study date: 11/25/2017. Study time: 08:37 AM.  Location: [facility]  -------------------------------------------------------------------  ------------------------------------------------------------------- Left ventricle:  The cavity size was mildly dilated. Wall thickness was increased in a pattern of mild LVH. Severe hypokinesis of the anteroseptal wall and the apex. The estimated ejection fraction was 40%. Features are consistent with a pseudonormal left ventricular filling pattern, with concomitant abnormal relaxation and increased filling pressure (grade 2 diastolic dysfunction).  ------------------------------------------------------------------- Aortic valve:   Trileaflet; mildly calcified leaflets.  Doppler: There was no stenosis.   There was no regurgitation.  ------------------------------------------------------------------- Aorta:  Aortic root: The aortic root was normal in size. Ascending aorta: The ascending aorta was normal in size.  ------------------------------------------------------------------- Mitral valve:   Mildly calcified annulus.  Doppler:   There was no evidence for stenosis.   There was moderate to severe regurgitation.    Valve area by pressure half-time:  1.85 cm^2. Indexed valve area by pressure half-time: 0.89 cm^2/m^2. Indexed valve area by continuity equation (using LVOT flow): 1.32 cm^2/m^2.    Mean gradient (D): 2 mm Hg. Peak gradient (D): 8 mm Hg.  ------------------------------------------------------------------- Left atrium:  The atrium was moderately to severely dilated.   ------------------------------------------------------------------- Right ventricle:  The cavity size was normal. Pacer wire or catheter noted in right ventricle. Systolic function was normal.   ------------------------------------------------------------------- Pulmonic valve:    Structurally normal valve.   Cusp separation was normal.  Doppler:  Transvalvular velocity was within the normal range. There was trivial regurgitation.  ------------------------------------------------------------------- Tricuspid valve:   Doppler:  There was trivial regurgitation.   ------------------------------------------------------------------- Right atrium:  The atrium was mildly dilated.  ------------------------------------------------------------------- Pericardium:  A trivial pericardial effusion was identified.  ------------------------------------------------------------------- Systemic veins: Inferior vena cava: The vessel was normal in size. The respirophasic diameter changes were in the normal range (= 50%), consistent with normal central venous pressure.  ------------------------------------------------------------------- Measurements   Left ventricle                           Value          Reference  LV ID, ED, PLAX chordal          (H)     59.6  mm       43 - 52  LV ID, ES, PLAX chordal          (H)     41.7  mm       23 - 38  LV fx shortening, PLAX chordal           30    %        >=29  LV PW thickness, ED  function. Mild   pulmonary hypertension. Biatrial enlargement. There was moderate   to severe mitral regurgitation with a degree of posterior  leaflet   restriction.  ------------------------------------------------------------------- Labs, prior tests, procedures, and surgery: Permanent pacemaker system implantation.  ------------------------------------------------------------------- Study data:  Comparison was made to the study of 10/30/2015.  Study status:  Routine.  Procedure:  Transthoracic echocardiography. Image quality was adequate.          Transthoracic echocardiography.  M-mode, complete 2D, 3D, spectral Doppler, and color Doppler.  Birthdate:  Patient birthdate: [birth year].  Age: Patient is 62 yr old.  Sex:  Gender: male.    BMI: 26 kg/m^2. Blood pressure:     138/82  Patient status:  Outpatient.  Study date:  Study date: 11/25/2017. Study time: 08:37 AM.  Location: [facility]  -------------------------------------------------------------------  ------------------------------------------------------------------- Left ventricle:  The cavity size was mildly dilated. Wall thickness was increased in a pattern of mild LVH. Severe hypokinesis of the anteroseptal wall and the apex. The estimated ejection fraction was 40%. Features are consistent with a pseudonormal left ventricular filling pattern, with concomitant abnormal relaxation and increased filling pressure (grade 2 diastolic dysfunction).  ------------------------------------------------------------------- Aortic valve:   Trileaflet; mildly calcified leaflets.  Doppler: There was no stenosis.   There was no regurgitation.  ------------------------------------------------------------------- Aorta:  Aortic root: The aortic root was normal in size. Ascending aorta: The ascending aorta was normal in size.  ------------------------------------------------------------------- Mitral valve:   Mildly calcified annulus.  Doppler:   There was no evidence for stenosis.   There was moderate to severe regurgitation.    Valve area by pressure half-time:  1.85 cm^2. Indexed valve area by pressure half-time: 0.89 cm^2/m^2. Indexed valve area by continuity equation (using LVOT flow): 1.32 cm^2/m^2.    Mean gradient (D): 2 mm Hg. Peak gradient (D): 8 mm Hg.  ------------------------------------------------------------------- Left atrium:  The atrium was moderately to severely dilated.   ------------------------------------------------------------------- Right ventricle:  The cavity size was normal. Pacer wire or catheter noted in right ventricle. Systolic function was normal.   ------------------------------------------------------------------- Pulmonic valve:    Structurally normal valve.   Cusp separation was normal.  Doppler:  Transvalvular velocity was within the normal range. There was trivial regurgitation.  ------------------------------------------------------------------- Tricuspid valve:   Doppler:  There was trivial regurgitation.   ------------------------------------------------------------------- Right atrium:  The atrium was mildly dilated.  ------------------------------------------------------------------- Pericardium:  A trivial pericardial effusion was identified.  ------------------------------------------------------------------- Systemic veins: Inferior vena cava: The vessel was normal in size. The respirophasic diameter changes were in the normal range (= 50%), consistent with normal central venous pressure.  ------------------------------------------------------------------- Measurements   Left ventricle                           Value          Reference  LV ID, ED, PLAX chordal          (H)     59.6  mm       43 - 52  LV ID, ES, PLAX chordal          (H)     41.7  mm       23 - 38  LV fx shortening, PLAX chordal           30    %        >=29  LV PW thickness, ED  function. Mild   pulmonary hypertension. Biatrial enlargement. There was moderate   to severe mitral regurgitation with a degree of posterior  leaflet   restriction.  ------------------------------------------------------------------- Labs, prior tests, procedures, and surgery: Permanent pacemaker system implantation.  ------------------------------------------------------------------- Study data:  Comparison was made to the study of 10/30/2015.  Study status:  Routine.  Procedure:  Transthoracic echocardiography. Image quality was adequate.          Transthoracic echocardiography.  M-mode, complete 2D, 3D, spectral Doppler, and color Doppler.  Birthdate:  Patient birthdate: [birth year].  Age: Patient is 62 yr old.  Sex:  Gender: male.    BMI: 26 kg/m^2. Blood pressure:     138/82  Patient status:  Outpatient.  Study date:  Study date: 11/25/2017. Study time: 08:37 AM.  Location: [facility]  -------------------------------------------------------------------  ------------------------------------------------------------------- Left ventricle:  The cavity size was mildly dilated. Wall thickness was increased in a pattern of mild LVH. Severe hypokinesis of the anteroseptal wall and the apex. The estimated ejection fraction was 40%. Features are consistent with a pseudonormal left ventricular filling pattern, with concomitant abnormal relaxation and increased filling pressure (grade 2 diastolic dysfunction).  ------------------------------------------------------------------- Aortic valve:   Trileaflet; mildly calcified leaflets.  Doppler: There was no stenosis.   There was no regurgitation.  ------------------------------------------------------------------- Aorta:  Aortic root: The aortic root was normal in size. Ascending aorta: The ascending aorta was normal in size.  ------------------------------------------------------------------- Mitral valve:   Mildly calcified annulus.  Doppler:   There was no evidence for stenosis.   There was moderate to severe regurgitation.    Valve area by pressure half-time:  1.85 cm^2. Indexed valve area by pressure half-time: 0.89 cm^2/m^2. Indexed valve area by continuity equation (using LVOT flow): 1.32 cm^2/m^2.    Mean gradient (D): 2 mm Hg. Peak gradient (D): 8 mm Hg.  ------------------------------------------------------------------- Left atrium:  The atrium was moderately to severely dilated.   ------------------------------------------------------------------- Right ventricle:  The cavity size was normal. Pacer wire or catheter noted in right ventricle. Systolic function was normal.   ------------------------------------------------------------------- Pulmonic valve:    Structurally normal valve.   Cusp separation was normal.  Doppler:  Transvalvular velocity was within the normal range. There was trivial regurgitation.  ------------------------------------------------------------------- Tricuspid valve:   Doppler:  There was trivial regurgitation.   ------------------------------------------------------------------- Right atrium:  The atrium was mildly dilated.  ------------------------------------------------------------------- Pericardium:  A trivial pericardial effusion was identified.  ------------------------------------------------------------------- Systemic veins: Inferior vena cava: The vessel was normal in size. The respirophasic diameter changes were in the normal range (= 50%), consistent with normal central venous pressure.  ------------------------------------------------------------------- Measurements   Left ventricle                           Value          Reference  LV ID, ED, PLAX chordal          (H)     59.6  mm       43 - 52  LV ID, ES, PLAX chordal          (H)     41.7  mm       23 - 38  LV fx shortening, PLAX chordal           30    %        >=29  LV PW thickness, ED  PW A Wave 16 mmHg  PW V Wave 18 mmHg  PW Mean 13 mmHg  AO Systolic Pressure 062 mmHg  AO Diastolic Pressure 68 mmHg  AO Mean 92 mmHg  LV Systolic Pressure 694 mmHg  LV Diastolic Pressure 8 mmHg  LV EDP 16 mmHg  AOp Systolic Pressure 854 mmHg  AOp Diastolic Pressure 73 mmHg  AOp Mean Pressure 95 mmHg  LVp Systolic Pressure 627 mmHg  LVp Diastolic Pressure 13 mmHg  LVp EDP Pressure 20 mmHg  QP/QS 1  TPVR Index 6.28 HRUI  TSVR Index 28.69 HRUI  PVR SVR Ratio 0.09  TPVR/TSVR Ratio 0.22    Transesophageal Echocardiography  Patient:    Tyler Hendrix, Tyler Hendrix MR #:       [id number] Study Date: 12/09/2017 Gender:     M Age:         62 Height:     180.3 cm Weight:     85.7 kg BSA:        2.08 m^2 Pt. Status: Room:   SONOGRAPHER  Johny Chess, RDCS, CCT  ORDERING     Skeet Latch, MD  ADMITTING    Harrell Gave, Bridgette  ATTENDING    Harrell Gave, Bridgette  PERFORMING   Harrell Gave, Bridgette  cc:  ------------------------------------------------------------------- LV EF: 40% -   45%  ------------------------------------------------------------------- Indications:      Mitral regurgitation 424.0. Severe valvular disease  ------------------------------------------------------------------- Study Conclusions  - Left ventricle: The cavity size was moderately dilated. Systolic   function was mildly to moderately reduced. The estimated ejection   fraction was in the range of 40% to 45%. Hypokinesis of the   anteroseptal myocardium. Hypokinesis of the anterior myocardium.   Hypokinesis of the apical myocardium. - Mitral valve: Moderately dilated annulus. Normal thickness   leaflets . There was malcoaptation of the valve leaflets. There   was severe regurgitation. Effective regurgitant orifice (PISA):   0.67 cm^2. Regurgitant volume (PISA): 113 ml.  Impressions:  - Moderately reduced LV function with moderately dilated LV and   anterior/anteroseptal/apical hypokinesis. 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  ------------------------------------------------------------------- Labs, prior tests, procedures, and surgery: Permanent pacemaker system implantation.  ------------------------------------------------------------------- Study data:   Study status:  Routine.  Consent:  The risks, benefits, and alternatives to the procedure were explained to the patient and informed consent was obtained.  Procedure:  Initial setup. The patient was brought to the laboratory. Surface ECG leads were monitored. Sedation. Conscious sedation was administered by cardiology staff.  Transesophageal echocardiography. A 3rd probe transesophageal probe was inserted by the attending cardiologistwithout difficulty. Image quality was adequate.  Study completion:  The patient tolerated the procedure well. There were no complications.  Administered medications:   Midazolam, 7mg , IV. Fentanyl, 2mcg, IV.          Diagnostic transesophageal echocardiography.  2D and color Doppler.  Birthdate:  Patient birthdate: [birth year].  Age:  Patient is 62 yr old.  Sex:  Gender: male.    BMI: 26.4 kg/m^2.  Blood pressure:     158/76  Patient status:  Outpatient.  Study date:  Study date: 12/09/2017. Study time: 07:57 AM.  Location:  [facility].  -------------------------------------------------------------------  ------------------------------------------------------------------- Left ventricle:  The cavity size was moderately dilated. Systolic function was mildly to moderately reduced. The estimated ejection fraction was in the range of 40% to 45%.  Regional wall motion abnormalities:   Hypokinesis of the anteroseptal myocardium. Hypokinesis of the anterior myocardium.  Hypokinesis of the apical myocardium.  ------------------------------------------------------------------- Aortic valve:  function. Mild   pulmonary hypertension. Biatrial enlargement. There was moderate   to severe mitral regurgitation with a degree of posterior  leaflet   restriction.  ------------------------------------------------------------------- Labs, prior tests, procedures, and surgery: Permanent pacemaker system implantation.  ------------------------------------------------------------------- Study data:  Comparison was made to the study of 10/30/2015.  Study status:  Routine.  Procedure:  Transthoracic echocardiography. Image quality was adequate.          Transthoracic echocardiography.  M-mode, complete 2D, 3D, spectral Doppler, and color Doppler.  Birthdate:  Patient birthdate: [birth year].  Age: Patient is 62 yr old.  Sex:  Gender: male.    BMI: 26 kg/m^2. Blood pressure:     138/82  Patient status:  Outpatient.  Study date:  Study date: 11/25/2017. Study time: 08:37 AM.  Location: [facility]  -------------------------------------------------------------------  ------------------------------------------------------------------- Left ventricle:  The cavity size was mildly dilated. Wall thickness was increased in a pattern of mild LVH. Severe hypokinesis of the anteroseptal wall and the apex. The estimated ejection fraction was 40%. Features are consistent with a pseudonormal left ventricular filling pattern, with concomitant abnormal relaxation and increased filling pressure (grade 2 diastolic dysfunction).  ------------------------------------------------------------------- Aortic valve:   Trileaflet; mildly calcified leaflets.  Doppler: There was no stenosis.   There was no regurgitation.  ------------------------------------------------------------------- Aorta:  Aortic root: The aortic root was normal in size. Ascending aorta: The ascending aorta was normal in size.  ------------------------------------------------------------------- Mitral valve:   Mildly calcified annulus.  Doppler:   There was no evidence for stenosis.   There was moderate to severe regurgitation.    Valve area by pressure half-time:  1.85 cm^2. Indexed valve area by pressure half-time: 0.89 cm^2/m^2. Indexed valve area by continuity equation (using LVOT flow): 1.32 cm^2/m^2.    Mean gradient (D): 2 mm Hg. Peak gradient (D): 8 mm Hg.  ------------------------------------------------------------------- Left atrium:  The atrium was moderately to severely dilated.   ------------------------------------------------------------------- Right ventricle:  The cavity size was normal. Pacer wire or catheter noted in right ventricle. Systolic function was normal.   ------------------------------------------------------------------- Pulmonic valve:    Structurally normal valve.   Cusp separation was normal.  Doppler:  Transvalvular velocity was within the normal range. There was trivial regurgitation.  ------------------------------------------------------------------- Tricuspid valve:   Doppler:  There was trivial regurgitation.   ------------------------------------------------------------------- Right atrium:  The atrium was mildly dilated.  ------------------------------------------------------------------- Pericardium:  A trivial pericardial effusion was identified.  ------------------------------------------------------------------- Systemic veins: Inferior vena cava: The vessel was normal in size. The respirophasic diameter changes were in the normal range (= 50%), consistent with normal central venous pressure.  ------------------------------------------------------------------- Measurements   Left ventricle                           Value          Reference  LV ID, ED, PLAX chordal          (H)     59.6  mm       43 - 52  LV ID, ES, PLAX chordal          (H)     41.7  mm       23 - 38  LV fx shortening, PLAX chordal           30    %        >=29  LV PW thickness, ED  function. Mild   pulmonary hypertension. Biatrial enlargement. There was moderate   to severe mitral regurgitation with a degree of posterior  leaflet   restriction.  ------------------------------------------------------------------- Labs, prior tests, procedures, and surgery: Permanent pacemaker system implantation.  ------------------------------------------------------------------- Study data:  Comparison was made to the study of 10/30/2015.  Study status:  Routine.  Procedure:  Transthoracic echocardiography. Image quality was adequate.          Transthoracic echocardiography.  M-mode, complete 2D, 3D, spectral Doppler, and color Doppler.  Birthdate:  Patient birthdate: [birth year].  Age: Patient is 62 yr old.  Sex:  Gender: male.    BMI: 26 kg/m^2. Blood pressure:     138/82  Patient status:  Outpatient.  Study date:  Study date: 11/25/2017. Study time: 08:37 AM.  Location: [facility]  -------------------------------------------------------------------  ------------------------------------------------------------------- Left ventricle:  The cavity size was mildly dilated. Wall thickness was increased in a pattern of mild LVH. Severe hypokinesis of the anteroseptal wall and the apex. The estimated ejection fraction was 40%. Features are consistent with a pseudonormal left ventricular filling pattern, with concomitant abnormal relaxation and increased filling pressure (grade 2 diastolic dysfunction).  ------------------------------------------------------------------- Aortic valve:   Trileaflet; mildly calcified leaflets.  Doppler: There was no stenosis.   There was no regurgitation.  ------------------------------------------------------------------- Aorta:  Aortic root: The aortic root was normal in size. Ascending aorta: The ascending aorta was normal in size.  ------------------------------------------------------------------- Mitral valve:   Mildly calcified annulus.  Doppler:   There was no evidence for stenosis.   There was moderate to severe regurgitation.    Valve area by pressure half-time:  1.85 cm^2. Indexed valve area by pressure half-time: 0.89 cm^2/m^2. Indexed valve area by continuity equation (using LVOT flow): 1.32 cm^2/m^2.    Mean gradient (D): 2 mm Hg. Peak gradient (D): 8 mm Hg.  ------------------------------------------------------------------- Left atrium:  The atrium was moderately to severely dilated.   ------------------------------------------------------------------- Right ventricle:  The cavity size was normal. Pacer wire or catheter noted in right ventricle. Systolic function was normal.   ------------------------------------------------------------------- Pulmonic valve:    Structurally normal valve.   Cusp separation was normal.  Doppler:  Transvalvular velocity was within the normal range. There was trivial regurgitation.  ------------------------------------------------------------------- Tricuspid valve:   Doppler:  There was trivial regurgitation.   ------------------------------------------------------------------- Right atrium:  The atrium was mildly dilated.  ------------------------------------------------------------------- Pericardium:  A trivial pericardial effusion was identified.  ------------------------------------------------------------------- Systemic veins: Inferior vena cava: The vessel was normal in size. The respirophasic diameter changes were in the normal range (= 50%), consistent with normal central venous pressure.  ------------------------------------------------------------------- Measurements   Left ventricle                           Value          Reference  LV ID, ED, PLAX chordal          (H)     59.6  mm       43 - 52  LV ID, ES, PLAX chordal          (H)     41.7  mm       23 - 38  LV fx shortening, PLAX chordal           30    %        >=29  LV PW thickness, ED  PW A Wave 16 mmHg  PW V Wave 18 mmHg  PW Mean 13 mmHg  AO Systolic Pressure 062 mmHg  AO Diastolic Pressure 68 mmHg  AO Mean 92 mmHg  LV Systolic Pressure 694 mmHg  LV Diastolic Pressure 8 mmHg  LV EDP 16 mmHg  AOp Systolic Pressure 854 mmHg  AOp Diastolic Pressure 73 mmHg  AOp Mean Pressure 95 mmHg  LVp Systolic Pressure 627 mmHg  LVp Diastolic Pressure 13 mmHg  LVp EDP Pressure 20 mmHg  QP/QS 1  TPVR Index 6.28 HRUI  TSVR Index 28.69 HRUI  PVR SVR Ratio 0.09  TPVR/TSVR Ratio 0.22    Transesophageal Echocardiography  Patient:    Tyler Hendrix, Tyler Hendrix MR #:       [id number] Study Date: 12/09/2017 Gender:     M Age:         62 Height:     180.3 cm Weight:     85.7 kg BSA:        2.08 m^2 Pt. Status: Room:   SONOGRAPHER  Johny Chess, RDCS, CCT  ORDERING     Skeet Latch, MD  ADMITTING    Harrell Gave, Bridgette  ATTENDING    Harrell Gave, Bridgette  PERFORMING   Harrell Gave, Bridgette  cc:  ------------------------------------------------------------------- LV EF: 40% -   45%  ------------------------------------------------------------------- Indications:      Mitral regurgitation 424.0. Severe valvular disease  ------------------------------------------------------------------- Study Conclusions  - Left ventricle: The cavity size was moderately dilated. Systolic   function was mildly to moderately reduced. The estimated ejection   fraction was in the range of 40% to 45%. Hypokinesis of the   anteroseptal myocardium. Hypokinesis of the anterior myocardium.   Hypokinesis of the apical myocardium. - Mitral valve: Moderately dilated annulus. Normal thickness   leaflets . There was malcoaptation of the valve leaflets. There   was severe regurgitation. Effective regurgitant orifice (PISA):   0.67 cm^2. Regurgitant volume (PISA): 113 ml.  Impressions:  - Moderately reduced LV function with moderately dilated LV and   anterior/anteroseptal/apical hypokinesis. 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  ------------------------------------------------------------------- Labs, prior tests, procedures, and surgery: Permanent pacemaker system implantation.  ------------------------------------------------------------------- Study data:   Study status:  Routine.  Consent:  The risks, benefits, and alternatives to the procedure were explained to the patient and informed consent was obtained.  Procedure:  Initial setup. The patient was brought to the laboratory. Surface ECG leads were monitored. Sedation. Conscious sedation was administered by cardiology staff.  Transesophageal echocardiography. A 3rd probe transesophageal probe was inserted by the attending cardiologistwithout difficulty. Image quality was adequate.  Study completion:  The patient tolerated the procedure well. There were no complications.  Administered medications:   Midazolam, 7mg , IV. Fentanyl, 2mcg, IV.          Diagnostic transesophageal echocardiography.  2D and color Doppler.  Birthdate:  Patient birthdate: [birth year].  Age:  Patient is 62 yr old.  Sex:  Gender: male.    BMI: 26.4 kg/m^2.  Blood pressure:     158/76  Patient status:  Outpatient.  Study date:  Study date: 12/09/2017. Study time: 07:57 AM.  Location:  Endoscopy.  -------------------------------------------------------------------  ------------------------------------------------------------------- Left ventricle:  The cavity size was moderately dilated. Systolic function was mildly to moderately reduced. The estimated ejection fraction was in the range of 40% to 45%.  Regional wall motion abnormalities:   Hypokinesis of the anteroseptal myocardium. Hypokinesis of the anterior myocardium.  Hypokinesis of the apical myocardium.  ------------------------------------------------------------------- Aortic valve:  PW A Wave 16 mmHg  PW V Wave 18 mmHg  PW Mean 13 mmHg  AO Systolic Pressure 062 mmHg  AO Diastolic Pressure 68 mmHg  AO Mean 92 mmHg  LV Systolic Pressure 694 mmHg  LV Diastolic Pressure 8 mmHg  LV EDP 16 mmHg  AOp Systolic Pressure 854 mmHg  AOp Diastolic Pressure 73 mmHg  AOp Mean Pressure 95 mmHg  LVp Systolic Pressure 627 mmHg  LVp Diastolic Pressure 13 mmHg  LVp EDP Pressure 20 mmHg  QP/QS 1  TPVR Index 6.28 HRUI  TSVR Index 28.69 HRUI  PVR SVR Ratio 0.09  TPVR/TSVR Ratio 0.22    Transesophageal Echocardiography  Patient:    Tyler Hendrix, Tyler Hendrix MR #:       [id number] Study Date: 12/09/2017 Gender:     M Age:         62 Height:     180.3 cm Weight:     85.7 kg BSA:        2.08 m^2 Pt. Status: Room:   SONOGRAPHER  Johny Chess, RDCS, CCT  ORDERING     Skeet Latch, MD  ADMITTING    Harrell Gave, Bridgette  ATTENDING    Harrell Gave, Bridgette  PERFORMING   Harrell Gave, Bridgette  cc:  ------------------------------------------------------------------- LV EF: 40% -   45%  ------------------------------------------------------------------- Indications:      Mitral regurgitation 424.0. Severe valvular disease  ------------------------------------------------------------------- Study Conclusions  - Left ventricle: The cavity size was moderately dilated. Systolic   function was mildly to moderately reduced. The estimated ejection   fraction was in the range of 40% to 45%. Hypokinesis of the   anteroseptal myocardium. Hypokinesis of the anterior myocardium.   Hypokinesis of the apical myocardium. - Mitral valve: Moderately dilated annulus. Normal thickness   leaflets . There was malcoaptation of the valve leaflets. There   was severe regurgitation. Effective regurgitant orifice (PISA):   0.67 cm^2. Regurgitant volume (PISA): 113 ml.  Impressions:  - Moderately reduced LV function with moderately dilated LV and   anterior/anteroseptal/apical hypokinesis. 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  ------------------------------------------------------------------- Labs, prior tests, procedures, and surgery: Permanent pacemaker system implantation.  ------------------------------------------------------------------- Study data:   Study status:  Routine.  Consent:  The risks, benefits, and alternatives to the procedure were explained to the patient and informed consent was obtained.  Procedure:  Initial setup. The patient was brought to the laboratory. Surface ECG leads were monitored. Sedation. Conscious sedation was administered by cardiology staff.  Transesophageal echocardiography. A 3rd probe transesophageal probe was inserted by the attending cardiologistwithout difficulty. Image quality was adequate.  Study completion:  The patient tolerated the procedure well. There were no complications.  Administered medications:   Midazolam, 7mg , IV. Fentanyl, 2mcg, IV.          Diagnostic transesophageal echocardiography.  2D and color Doppler.  Birthdate:  Patient birthdate: [birth year].  Age:  Patient is 62 yr old.  Sex:  Gender: male.    BMI: 26.4 kg/m^2.  Blood pressure:     158/76  Patient status:  Outpatient.  Study date:  Study date: 12/09/2017. Study time: 07:57 AM.  Location:  Endoscopy.  -------------------------------------------------------------------  ------------------------------------------------------------------- Left ventricle:  The cavity size was moderately dilated. Systolic function was mildly to moderately reduced. The estimated ejection fraction was in the range of 40% to 45%.  Regional wall motion abnormalities:   Hypokinesis of the anteroseptal myocardium. Hypokinesis of the anterior myocardium.  Hypokinesis of the apical myocardium.  ------------------------------------------------------------------- Aortic valve:

## 2018-01-05 ENCOUNTER — Other Ambulatory Visit: Payer: Self-pay | Admitting: *Deleted

## 2018-01-12 ENCOUNTER — Other Ambulatory Visit: Payer: Self-pay | Admitting: Internal Medicine

## 2018-01-19 ENCOUNTER — Ambulatory Visit
Admission: RE | Admit: 2018-01-19 | Discharge: 2018-01-19 | Disposition: A | Discharge status: Home / Self Care | Payer: 59 | Source: Ambulatory Visit | Attending: Thoracic Surgery (Cardiothoracic Vascular Surgery) | Admitting: Thoracic Surgery (Cardiothoracic Vascular Surgery)

## 2018-01-19 ENCOUNTER — Encounter: Payer: Self-pay | Admitting: Thoracic Surgery (Cardiothoracic Vascular Surgery)

## 2018-01-19 ENCOUNTER — Encounter: Payer: Self-pay | Admitting: Radiology

## 2018-01-19 DIAGNOSIS — R911 Solitary pulmonary nodule: Secondary | ICD-10-CM

## 2018-01-19 DIAGNOSIS — Z01818 Encounter for other preprocedural examination: Secondary | ICD-10-CM

## 2018-01-19 DIAGNOSIS — I340 Nonrheumatic mitral (valve) insufficiency: Secondary | ICD-10-CM

## 2018-01-19 DIAGNOSIS — R918 Other nonspecific abnormal finding of lung field: Secondary | ICD-10-CM | POA: Insufficient documentation

## 2018-01-19 DIAGNOSIS — I7409 Other arterial embolism and thrombosis of abdominal aorta: Secondary | ICD-10-CM

## 2018-01-19 HISTORY — DX: Solitary pulmonary nodule: R91.1

## 2018-01-19 MED ORDER — IOPAMIDOL (ISOVUE-370) INJECTION 76%
75.0000 mL | Freq: Once | INTRAVENOUS | Status: AC | PRN
  Administered 2018-01-19: 75 mL via INTRAVENOUS

## 2018-01-21 ENCOUNTER — Ambulatory Visit: Payer: 59 | Admitting: Internal Medicine

## 2018-01-25 ENCOUNTER — Encounter (HOSPITAL_COMMUNITY): Payer: Self-pay

## 2018-01-25 ENCOUNTER — Ambulatory Visit (HOSPITAL_COMMUNITY)
Admission: RE | Admit: 2018-01-25 | Discharge: 2018-01-25 | Disposition: A | Discharge status: Home / Self Care | Payer: 59 | Source: Ambulatory Visit | Attending: Thoracic Surgery (Cardiothoracic Vascular Surgery) | Admitting: Thoracic Surgery (Cardiothoracic Vascular Surgery)

## 2018-01-25 ENCOUNTER — Encounter: Payer: Self-pay | Admitting: Thoracic Surgery (Cardiothoracic Vascular Surgery)

## 2018-01-25 ENCOUNTER — Encounter (HOSPITAL_COMMUNITY)
Admission: RE | Admit: 2018-01-25 | Discharge: 2018-01-25 | Disposition: A | Discharge status: Home / Self Care | Payer: 59 | Source: Ambulatory Visit | Attending: Thoracic Surgery (Cardiothoracic Vascular Surgery) | Admitting: Thoracic Surgery (Cardiothoracic Vascular Surgery)

## 2018-01-25 ENCOUNTER — Other Ambulatory Visit (HOSPITAL_COMMUNITY): Payer: 59

## 2018-01-25 ENCOUNTER — Other Ambulatory Visit: Payer: Self-pay

## 2018-01-25 ENCOUNTER — Ambulatory Visit: Payer: 59 | Admitting: Thoracic Surgery (Cardiothoracic Vascular Surgery)

## 2018-01-25 VITALS — BP 141/87 | HR 59 | Resp 16 | Ht 71.0 in | Wt 185.0 lb

## 2018-01-25 DIAGNOSIS — I2510 Atherosclerotic heart disease of native coronary artery without angina pectoris: Secondary | ICD-10-CM | POA: Insufficient documentation

## 2018-01-25 DIAGNOSIS — D62 Acute posthemorrhagic anemia: Secondary | ICD-10-CM | POA: Diagnosis not present

## 2018-01-25 DIAGNOSIS — R0602 Shortness of breath: Secondary | ICD-10-CM | POA: Diagnosis not present

## 2018-01-25 DIAGNOSIS — I340 Nonrheumatic mitral (valve) insufficiency: Secondary | ICD-10-CM

## 2018-01-25 DIAGNOSIS — I5042 Chronic combined systolic (congestive) and diastolic (congestive) heart failure: Secondary | ICD-10-CM | POA: Diagnosis not present

## 2018-01-25 HISTORY — DX: Hypothyroidism, unspecified: E03.9

## 2018-01-25 HISTORY — DX: Dyspnea, unspecified: R06.00

## 2018-01-25 HISTORY — DX: Unspecified osteoarthritis, unspecified site: M19.90

## 2018-01-25 LAB — PULMONARY FUNCTION TEST
DL/VA % pred: 102 %
DL/VA: 4.81 ml/min/mmHg/L
DLCO unc % pred: 62 %
DLCO unc: 21.21 ml/min/mmHg
FEF 25-75 Post: 2.28 L/sec
FEF 25-75 Pre: 1.29 L/sec
FEF2575-%Change-Post: 76 %
FEF2575-%PRED-POST: 75 %
FEF2575-%Pred-Pre: 42 %
FEV1-%Change-Post: 14 %
FEV1-%Pred-Post: 62 %
FEV1-%Pred-Pre: 54 %
FEV1-Post: 2.33 L
FEV1-Pre: 2.03 L
FEV1FVC-%Change-Post: 5 %
FEV1FVC-%Pred-Pre: 95 %
FEV6-%Change-Post: 7 %
FEV6-%Pred-Post: 64 %
FEV6-%Pred-Pre: 59 %
FEV6-Post: 3.01 L
FEV6-Pre: 2.8 L
FEV6FVC-%CHANGE-POST: -1 %
FEV6FVC-%Pred-Post: 103 %
FEV6FVC-%Pred-Pre: 104 %
FVC-%Change-Post: 8 %
FVC-%Pred-Post: 62 %
FVC-%Pred-Pre: 57 %
FVC-Post: 3.05 L
FVC-Pre: 2.8 L
PRE FEV6/FVC RATIO: 100 %
Post FEV1/FVC ratio: 76 %
Post FEV6/FVC ratio: 99 %
Pre FEV1/FVC ratio: 72 %
RV % pred: 77 %
RV: 1.79 L
TLC % pred: 66 %
TLC: 4.79 L

## 2018-01-25 LAB — BLOOD GAS, ARTERIAL
Acid-Base Excess: 1.2 mmol/L (ref 0.0–2.0)
Bicarbonate: 24.9 mmol/L (ref 20.0–28.0)
Drawn by: 421801
FIO2: 21
O2 Saturation: 97.1 %
PO2 ART: 86.8 mmHg (ref 83.0–108.0)
Patient temperature: 98.6
pCO2 arterial: 37.1 mmHg (ref 32.0–48.0)
pH, Arterial: 7.442 (ref 7.350–7.450)

## 2018-01-25 LAB — SURGICAL PCR SCREEN
MRSA, PCR: NEGATIVE
Staphylococcus aureus: NEGATIVE

## 2018-01-25 LAB — CBC
HCT: 42.2 % (ref 39.0–52.0)
Hemoglobin: 14.1 g/dL (ref 13.0–17.0)
MCH: 31.2 pg (ref 26.0–34.0)
MCHC: 33.4 g/dL (ref 30.0–36.0)
MCV: 93.4 fL (ref 80.0–100.0)
Platelets: 263 10*3/uL (ref 150–400)
RBC: 4.52 MIL/uL (ref 4.22–5.81)
RDW: 13.1 % (ref 11.5–15.5)
WBC: 8.6 10*3/uL (ref 4.0–10.5)
nRBC: 0 % (ref 0.0–0.2)

## 2018-01-25 LAB — COMPREHENSIVE METABOLIC PANEL
ALT: 34 U/L (ref 0–44)
AST: 28 U/L (ref 15–41)
Albumin: 4 g/dL (ref 3.5–5.0)
Alkaline Phosphatase: 58 U/L (ref 38–126)
Anion gap: 9 (ref 5–15)
BUN: 15 mg/dL (ref 8–23)
CO2: 22 mmol/L (ref 22–32)
CREATININE: 1.14 mg/dL (ref 0.61–1.24)
Calcium: 9.1 mg/dL (ref 8.9–10.3)
Chloride: 109 mmol/L (ref 98–111)
GFR calc non Af Amer: >60 mL/min (ref >60)
Glucose, Bld: 121 mg/dL — ABNORMAL HIGH (ref 70–99)
Potassium: 3.6 mmol/L (ref 3.5–5.1)
Sodium: 140 mmol/L (ref 135–145)
Total Bilirubin: 1.1 mg/dL (ref 0.3–1.2)
Total Protein: 6.8 g/dL (ref 6.5–8.1)

## 2018-01-25 LAB — URINALYSIS, ROUTINE W REFLEX MICROSCOPIC
Bilirubin Urine: NEGATIVE
Glucose, UA: NEGATIVE mg/dL
Hgb urine dipstick: NEGATIVE
Ketones, ur: NEGATIVE mg/dL
Leukocytes, UA: NEGATIVE
Nitrite: NEGATIVE
Protein, ur: NEGATIVE mg/dL
Specific Gravity, Urine: 1.019 (ref 1.005–1.030)
pH: 6 (ref 5.0–8.0)

## 2018-01-25 LAB — PROTIME-INR
INR: 1.02
Prothrombin Time: 13.3 seconds (ref 11.4–15.2)

## 2018-01-25 LAB — APTT: aPTT: 33 seconds (ref 24–36)

## 2018-01-25 LAB — HEMOGLOBIN A1C
HEMOGLOBIN A1C: 4.7 % — AB (ref 4.8–5.6)
Mean Plasma Glucose: 88.19 mg/dL

## 2018-01-25 LAB — ABO/RH: ABO/RH(D): O POS

## 2018-01-25 MED ORDER — ALBUTEROL SULFATE (2.5 MG/3ML) 0.083% IN NEBU
2.5000 mg | INHALATION_SOLUTION | Freq: Once | RESPIRATORY_TRACT | Status: AC
  Administered 2018-01-25: 2.5 mg via RESPIRATORY_TRACT

## 2018-01-25 NOTE — Patient Instructions (Signed)
   Continue taking all current medications without change through the day before surgery.  Have nothing to eat or drink after midnight the night before surgery.  On the morning of surgery take only Synthroid with a sip of water.     

## 2018-01-25 NOTE — Pre-Procedure Instructions (Signed)
PLEASE RE-READ OVER ALL THIS INFORMATION    Tyler Hendrix  01/25/2018      [facility] [place], [place] - [address] [place] [address] Phone: [phone] Fax: [phone]  [facility] - [place], [place] - [address] AT [place] [address] [address] Phone: [phone] Fax: [phone]  [place], [place] - [address] AT [address] [place] [address] Phone: [phone] Fax: [place], [place] [address] [place] [address] Phone: [phone] Fax: [phone]  [id number] - [place], [place]. AT [address] [place] [address] Phone: [phone] Fax: [phone]    Your procedure is scheduled on Wednesday, Dec. 18th   Report to [facility] at 0630 am             (posted surgery time 8:30a - 3:49p)   Call this number if you have problems the morning of surgery:  [phone]   Remember:   Do not eat any foods or drink any liquids after midnight, Tuesday.   7 days prior to surgery, STOP TAKING any Vitamins, Herbal Supplements, Anti-inflammatories.    Take these medicines the morning of surgery with A SIP OF WATER : Levothyroxine    Do not wear jewelry - NO RINGS  Do not wear lotions, colognes or deodorant.             Men may shave face and neck.  Do not bring valuables to the hospital.  [facility] is not responsible for any belongings or valuables.  Contacts, dentures or bridgework may not be worn into surgery.  Leave your suitcase in the car.  After surgery it may be brought to your room.  For patients admitted to the hospital, discharge time will be determined by your treatment team.  Please read over the following fact sheets that you  were given. Pain Booklet, MRSA Information and Surgical Site Infection Prevention       [facility]- Preparing For Surgery  Before surgery, you can play an important role. Because skin is not sterile, your skin needs to be as free of germs as possible. You can reduce the number of germs on your skin by washing with CHG (chlorahexidine gluconate) Soap before surgery.  CHG is an antiseptic cleaner which kills germs and bonds with the skin to continue killing germs even after washing.    Oral Hygiene is also important to reduce your risk of infection.    Remember - BRUSH YOUR TEETH THE MORNING OF SURGERY WITH YOUR REGULAR TOOTHPASTE  Please do not use if you have an allergy to CHG or antibacterial soaps. If your skin becomes reddened/irritated stop using the CHG.  Do not shave (including legs and underarms) for at least 48 hours prior to first CHG shower. It is OK to shave your face.  Please follow these instructions carefully.   1. Shower the NIGHT BEFORE SURGERY and the MORNING OF SURGERY with CHG.   2. If you chose to wash your hair, wash your hair first as usual with your normal shampoo.  3. After you shampoo, rinse your hair and body thoroughly to remove the shampoo.  4. Use CHG as you would any other liquid soap. You can apply CHG  directly to the skin and wash gently with a scrungie or a clean washcloth.   5. Apply the CHG Soap to your body ONLY FROM THE NECK DOWN.  Do not use on open wounds or open sores. Avoid contact with your eyes, ears, mouth and genitals (private parts). Wash Face and genitals (private parts)  with your normal soap.  6. Wash thoroughly, paying special attention to the area where your surgery will be performed.  7. Thoroughly rinse your body with warm water from the neck down.  8. DO NOT shower/wash with your normal soap after using and rinsing off the CHG Soap.  9. Pat yourself dry with a CLEAN TOWEL.  10. Wear CLEAN PAJAMAS to bed the night before  surgery, wear comfortable clothes the morning of surgery  11. Place CLEAN SHEETS on your bed the night of your first shower and DO NOT SLEEP WITH PETS.  Day of Surgery:  Do not apply any deodorants/lotions.  Please wear clean clothes to the hospital/surgery center.   Remember to brush your teeth WITH YOUR REGULAR TOOTHPASTE.

## 2018-01-25 NOTE — Progress Notes (Signed)
[address]       [place] [address]             [phone]     CARDIOTHORACIC SURGERY OFFICE NOTE  Referring Provider is [name], MD  Primary Cardiologist is Pixie Casino, MD PCP is Tyler Hendrix, No Pcp Per   HPI:  Tyler Hendrix is a 62 year old male with history of Hodgkin's disease treated with combined chemotherapy and radiation therapy to the chest in 2002, complete heart block status post permanent pacemaker placement in 2017, and coronary artery disease status post multivessel PCI and stenting in 2017 who returns to the office today with tentative plans to proceed with mitral valve repair or replacement and coronary artery bypass grafting on January 27, 2018.  He was originally seen in consultation on January 04, 2018.  He reports no new problems or complaints.  He is looking forward to his surgery put behind him.   Current Outpatient Medications  Medication Sig Dispense Refill  . aspirin EC 81 MG tablet Take 81 mg by mouth daily.    Marland Kitchen atorvastatin (LIPITOR) 80 MG tablet Take 1 tablet (80 mg total) by mouth daily at 6 PM. 90 tablet 3  . levocetirizine (XYZAL) 5 MG tablet Take 5 mg by mouth at bedtime.   3  . levothyroxine (SYNTHROID, LEVOTHROID) 75 MCG tablet TAKE 1 TABLET BY MOUTH  DAILY (Tyler Hendrix taking differently: Take 75 mcg by mouth daily before breakfast. TAKE 1 TABLET BY MOUTH  DAILY) 90 tablet 0  . montelukast (SINGULAIR) 10 MG tablet Take 10 mg by mouth at bedtime.   3  . Multiple Vitamin (MULTIVITAMIN WITH MINERALS) TABS tablet Take 1 tablet by mouth daily.    Marland Kitchen olmesartan (BENICAR) 20 MG tablet Take 20 mg by mouth daily.    . traZODone (DESYREL) 50 MG tablet Take 1 tablet (50 mg total) by mouth at bedtime. (Tyler Hendrix taking differently: Take 25 mg by mouth at bedtime. ) 90 tablet 3  . nitroGLYCERIN (NITROSTAT) 0.4 MG SL tablet Place 1 tablet (0.4 mg total) under the tongue every 5 (five) minutes as needed for chest pain. (Tyler Hendrix not taking:  Reported on 01/25/2018) 100 tablet 3   No current facility-administered medications for this visit.       Physical Exam:   BP (!) 141/87 (BP Location: Right Arm, Tyler Hendrix Position: Sitting, Cuff Size: Large)   Pulse (!) 59   Resp 16   Ht 5\' 11"  (1.803 m)   Wt 185 lb (83.9 kg)   SpO2 98%   BMI 25.80 kg/m   General:  Well-appearing  Chest:   Clear to auscultation  CV:   Regular rate and rhythm with systolic murmur  Incisions:  n/a  Abdomen:  Soft nontender  Extremities:  Warm and well-perfused  Diagnostic Tests:  CT ANGIOGRAPHY CHEST, ABDOMEN AND PELVIS  TECHNIQUE: Multidetector CT imaging through the chest, abdomen and pelvis was performed using the standard protocol during bolus administration of intravenous contrast. Multiplanar reconstructed images and MIPs were obtained and reviewed to evaluate the vascular anatomy.  CONTRAST:  18mL ISOVUE-370 IOPAMIDOL (ISOVUE-370) INJECTION 76%  COMPARISON:  PET scan of May 26, 2008.  FINDINGS: CTA CHEST FINDINGS  Cardiovascular: Preferential opacification of the thoracic aorta. No evidence of thoracic aortic aneurysm or dissection. Mild cardiomegaly is noted. Left-sided pacemaker is noted. No pericardial effusion.  Mediastinum/Nodes: No enlarged mediastinal, hilar, or axillary lymph nodes. Thyroid gland, trachea, and esophagus demonstrate no significant findings.  Lungs/Pleura: No  pneumothorax or pleural effusion is noted. Right lung is clear. Left apical scarring is noted. 20 x 10 mm irregular density is noted medially in the left upper lobe which most likely represent scarring, but follow-up CT scan in 3 months is recommended to ensure stability and rule out neoplasm.  Musculoskeletal: No chest wall abnormality. No acute or significant osseous findings.  Review of the MIP images confirms the above findings.  CTA ABDOMEN AND PELVIS FINDINGS  VASCULAR  Aorta: Normal caliber aorta without aneurysm,  dissection, vasculitis or significant stenosis.  Celiac: Patent without evidence of aneurysm, dissection, vasculitis or significant stenosis.  SMA: Patent without evidence of aneurysm, dissection, vasculitis or significant stenosis.  Renals: 2 left renal arteries are noted. 1 right renal artery is noted. No significant stenosis or thrombus is noted.  IMA: Patent without evidence of aneurysm, dissection, vasculitis or significant stenosis.  Inflow: Patent without evidence of aneurysm, dissection, vasculitis or significant stenosis.  Veins: No obvious venous abnormality within the limitations of this arterial phase study.  Review of the MIP images confirms the above findings.  NON-VASCULAR  Hepatobiliary: No focal liver abnormality is seen. No gallstones, gallbladder wall thickening, or biliary dilatation.  Pancreas: Unremarkable. No pancreatic ductal dilatation or surrounding inflammatory changes.  Spleen: Normal in size without focal abnormality.  Adrenals/Urinary Tract: Adrenal glands are unremarkable. Kidneys are normal, without renal calculi, focal lesion, or hydronephrosis. Bladder is unremarkable.  Stomach/Bowel: Stomach appears normal. There is no evidence of bowel obstruction or inflammation. The appendix is not visualized.  Lymphatic: No significant adenopathy is noted.  Reproductive: Prostate is unremarkable.  Other: Small bilateral fat containing inguinal hernias are noted. No abnormal fluid collection is noted.  Musculoskeletal: No acute or significant osseous findings.  Review of the MIP images confirms the above findings.  IMPRESSION: No evidence of thoracic or abdominal aortic dissection or aneurysm. No significant atheromatous disease is noted.  The mesenteric and renal arteries are widely patent without significant stenosis.  Left apical scarring is noted. 20 x 10 mm irregular density is noted medially in left lung apex  which most likely represent scarring, but unenhanced follow-up chest CT in 3 months is recommended to ensure stability and rule out malignancy.  Mild cardiomegaly is noted.  Small bilateral fat containing inguinal hernias.   Electronically Signed   By: Marijo Conception, M.D.   On: 01/19/2018 13:42   Impression:  Tyler Hendrix has stage D severe symptomatic primary mitral regurgitation and multivessel coronary artery disease.  He describes stable symptoms of exertional shortness of breath and fatigue consistent with chronic combined systolic and diastolic congestive heart failure, New York Heart Association functional class I-II.  The Tyler Hendrix is quite active physically and has relatively mild symptoms, but he has noticed that considerable change in his exercise tolerance with persistent symptoms that are becoming limiting.  I have personally reviewed the Tyler Hendrix's recent transthoracic and transesophageal echocardiograms, diagnostic cardiac catheterization, and CT angiogram.  Echocardiograms document the presence of mild to moderate left ventricular systolic dysfunction with ejection fraction estimated 40 to 50% in the setting of severe mitral regurgitation.  Functional anatomy of the mitral valve appears to be likely mixed etiology with primarily significant restricted leaflet mobility involving both the anterior and posterior leaflets of the mitral valve, posterior more than anterior.  Although this could be functional, the severity of mitral regurgitation and the amount of leaflet restriction appears disproportionate to the amount of underlying left ventricular systolic dysfunction and left ventricular chamber enlargement.  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  There is clearly severe mitral regurgitation.  Diagnostic cardiac catheterization also reveals multivessel coronary artery disease  with diffuse narrowing of all of the epicardial vessels as might be expected in patients with history of radiation therapy to the chest.  There is 80% fusiform stenosis of the mid left anterior descending coronary artery and long segment tubular 50% stenosis of the right coronary artery.  Previous stents in the diagonal branch on the obtuse marginal branch of the left circumflex remain widely patent and free of disease.  Pulmonary artery pressures were mildly elevated.  I agree the Tyler Hendrix would best be treated with mitral valve repair or replacement and coronary artery bypass grafting.  Based upon review of the Tyler Hendrix's transesophageal echocardiogram I feel there remains a better than 50% likelihood that his valve can be repaired, but in the setting of underlying radiation-induced disease there is certainly a possibility it will need to be replaced.  Alternatives to surgical intervention include continued medical therapy without any intervention versus PCI and stenting of the left anterior descending coronary artery with continued medical therapy and follow-up of the Tyler Hendrix's underlying valvular disease.  CT angiogram of the chest abdomen and pelvis revealed no complicating features and specifically no significant atherosclerotic disease involving the thoracic or abdominal aorta.  There is 1 area of nodular density at the apex of the left lung which probably represents scar tissue.  Follow-up CT scan in 3 to 6 months has been recommended to ensure stability.    Plan:  The Tyler Hendrix and his wife were again counseled at length regarding the indications, risks and potential benefits of mitral valve repair or replacement and coronary artery bypass grafting.  The rationale for elective surgery has been explained, including a comparison between surgery and continued medical therapy with close follow-up.  The likelihood of successful and durable valve repair has been discussed with particular reference to the  findings of their recent echocardiogram.  Based upon these findings and previous experience, I have quoted them a greater than 50 percent likelihood of successful valve repair.  In the event that their valve cannot be successfully repaired, we discussed the possibility of replacing the mitral valve using a mechanical prosthesis with the attendant need for long-term anticoagulation versus the alternative of replacing it using a bioprosthetic tissue valve with its potential for late structural valve deterioration and failure, depending upon the Tyler Hendrix's longevity.  The Tyler Hendrix specifically requests that if the mitral valve must be replaced that it be done using a bioprosthetic tissue valve.  Expectations for the Tyler Hendrix's postoperative convalescence have been discussed.  He Tyler Hendrix understands and accepts all potential risks of surgery including but not limited to risk of death, stroke or other neurologic complication, myocardial infarction, congestive heart failure, respiratory failure, renal failure, bleeding requiring transfusion and/or reexploration, arrhythmia, infection or other wound complications, pneumonia, pleural and/or pericardial effusion, pulmonary embolus, aortic dissection or other major vascular complication, or delayed complications related to valve repair or replacement including but not limited to structural valve deterioration and failure, thrombosis, embolization, endocarditis, or paravalvular leak.  All of their questions have been answered.  We plan to proceed with surgery on Wednesday, January 27, 2018.   I spent in excess of 15 minutes during the conduct of this office consultation and >50% of this time involved direct face-to-face encounter with the Tyler Hendrix for counseling and/or coordination of their care.  Valentina Gu. Roxy Manns, MD 01/25/2018 11:56 AM

## 2018-01-25 NOTE — Progress Notes (Signed)
PCP was Dr. Nonie Hoyer, who has since retired.  Patient hasn't gotten in to see Dr. Wandra Scot yet. Cardio is Dr. Wells Guiles  LOV 04/2017 EP is Dr. Clearnce Hasten - he placed [employer] pacer. Echo 11/2017 Stress test 2016 Cath 11/2017 Patient states has h/x  hodkins lymphoma    Was treated with radiation and chemo  'yrs ago" Currently denies any murmur, cp, sob.

## 2018-01-26 MED ORDER — VANCOMYCIN HCL 10 G IV SOLR
1500.0000 mg | INTRAVENOUS | Status: AC
  Administered 2018-01-27: 1500 mg via INTRAVENOUS
  Filled 2018-01-26: qty 1500

## 2018-01-26 MED ORDER — GLUTARALDEHYDE 0.625% SOAKING SOLUTION
TOPICAL | Status: DC
  Filled 2018-01-26: qty 50

## 2018-01-26 MED ORDER — PLASMA-LYTE 148 IV SOLN
INTRAVENOUS | Status: DC
  Filled 2018-01-26: qty 2.5

## 2018-01-26 MED ORDER — NITROGLYCERIN IN D5W 200-5 MCG/ML-% IV SOLN
2.0000 ug/min | INTRAVENOUS | Status: AC
  Administered 2018-01-27: 10 ug/min via INTRAVENOUS
  Filled 2018-01-26: qty 250

## 2018-01-26 MED ORDER — VANCOMYCIN HCL 1000 MG IV SOLR
INTRAVENOUS | Status: DC
  Filled 2018-01-26 (×2): qty 1000

## 2018-01-26 MED ORDER — DOPAMINE-DEXTROSE 3.2-5 MG/ML-% IV SOLN
0.0000 ug/kg/min | INTRAVENOUS | Status: DC
  Filled 2018-01-26: qty 250

## 2018-01-26 MED ORDER — TRANEXAMIC ACID (OHS) BOLUS VIA INFUSION
15.0000 mg/kg | INTRAVENOUS | Status: AC
  Administered 2018-01-27: 1293 mg via INTRAVENOUS
  Filled 2018-01-26: qty 1293

## 2018-01-26 MED ORDER — TRANEXAMIC ACID (OHS) PUMP PRIME SOLUTION
2.0000 mg/kg | INTRAVENOUS | Status: DC
  Filled 2018-01-26: qty 1.72

## 2018-01-26 MED ORDER — NOREPINEPHRINE 4 MG/250ML-% IV SOLN
0.0000 ug/min | INTRAVENOUS | Status: DC
  Filled 2018-01-26: qty 250

## 2018-01-26 MED ORDER — POTASSIUM CHLORIDE 2 MEQ/ML IV SOLN
80.0000 meq | INTRAVENOUS | Status: DC
  Filled 2018-01-26: qty 40

## 2018-01-26 MED ORDER — SODIUM CHLORIDE 0.9 % IV SOLN
1.5000 g | INTRAVENOUS | Status: AC
  Administered 2018-01-27: 1.5 g via INTRAVENOUS
  Filled 2018-01-26: qty 1.5

## 2018-01-26 MED ORDER — INSULIN REGULAR(HUMAN) IN NACL 100-0.9 UT/100ML-% IV SOLN
INTRAVENOUS | Status: AC
  Administered 2018-01-27: 1 [IU]/h via INTRAVENOUS
  Filled 2018-01-26: qty 100

## 2018-01-26 MED ORDER — MILRINONE LACTATE IN DEXTROSE 20-5 MG/100ML-% IV SOLN
0.3000 ug/kg/min | INTRAVENOUS | Status: AC
  Administered 2018-01-27: .2 ug/kg/min via INTRAVENOUS
  Filled 2018-01-26: qty 100

## 2018-01-26 MED ORDER — SODIUM CHLORIDE 0.9 % IV SOLN
750.0000 mg | INTRAVENOUS | Status: DC
  Filled 2018-01-26: qty 750

## 2018-01-26 MED ORDER — EPINEPHRINE PF 1 MG/ML IJ SOLN
0.0000 ug/min | INTRAVENOUS | Status: AC
  Administered 2018-01-27: 2 ug/min via INTRAVENOUS
  Filled 2018-01-26: qty 4

## 2018-01-26 MED ORDER — PHENYLEPHRINE HCL-NACL 20-0.9 MG/250ML-% IV SOLN
30.0000 ug/min | INTRAVENOUS | Status: AC
  Administered 2018-01-27: 15 ug/min via INTRAVENOUS
  Filled 2018-01-26: qty 250

## 2018-01-26 MED ORDER — DEXMEDETOMIDINE HCL IN NACL 400 MCG/100ML IV SOLN
0.1000 ug/kg/h | INTRAVENOUS | Status: AC
  Administered 2018-01-27: .2 ug/kg/h via INTRAVENOUS
  Filled 2018-01-26: qty 100

## 2018-01-26 MED ORDER — MAGNESIUM SULFATE 50 % IJ SOLN
40.0000 meq | INTRAMUSCULAR | Status: DC
  Filled 2018-01-26: qty 9.85

## 2018-01-26 MED ORDER — SODIUM CHLORIDE 0.9 % IV SOLN
INTRAVENOUS | Status: DC
  Filled 2018-01-26: qty 30

## 2018-01-26 MED ORDER — TRANEXAMIC ACID 1000 MG/10ML IV SOLN
1.5000 mg/kg/h | INTRAVENOUS | Status: AC
  Administered 2018-01-27: 1.5 mg/kg/h via INTRAVENOUS
  Filled 2018-01-26: qty 25

## 2018-01-26 NOTE — Progress Notes (Signed)
Received device orders.  Tomi Bamberger and [place] from [employer] have been notified.

## 2018-01-27 ENCOUNTER — Inpatient Hospital Stay (HOSPITAL_COMMUNITY): Payer: 59

## 2018-01-27 ENCOUNTER — Inpatient Hospital Stay (HOSPITAL_COMMUNITY)
Admission: RE | Disposition: A | Discharge status: Home / Self Care | Payer: Self-pay | Source: Home / Self Care | Attending: Thoracic Surgery (Cardiothoracic Vascular Surgery)

## 2018-01-27 ENCOUNTER — Inpatient Hospital Stay (HOSPITAL_COMMUNITY)
Admission: RE | Admit: 2018-01-27 | Discharge: 2018-02-01 | DRG: 220 | Disposition: A | Discharge status: Home / Self Care | Payer: 59 | Attending: Thoracic Surgery (Cardiothoracic Vascular Surgery) | Admitting: Thoracic Surgery (Cardiothoracic Vascular Surgery)

## 2018-01-27 ENCOUNTER — Inpatient Hospital Stay (HOSPITAL_COMMUNITY): Payer: 59 | Admitting: Anesthesiology

## 2018-01-27 ENCOUNTER — Other Ambulatory Visit: Payer: Self-pay

## 2018-01-27 ENCOUNTER — Encounter (HOSPITAL_COMMUNITY): Payer: Self-pay | Admitting: Anesthesiology

## 2018-01-27 DIAGNOSIS — Z9221 Personal history of antineoplastic chemotherapy: Secondary | ICD-10-CM

## 2018-01-27 DIAGNOSIS — I110 Hypertensive heart disease with heart failure: Secondary | ICD-10-CM | POA: Diagnosis present

## 2018-01-27 DIAGNOSIS — Z951 Presence of aortocoronary bypass graft: Secondary | ICD-10-CM

## 2018-01-27 DIAGNOSIS — Z79899 Other long term (current) drug therapy: Secondary | ICD-10-CM

## 2018-01-27 DIAGNOSIS — Z8249 Family history of ischemic heart disease and other diseases of the circulatory system: Secondary | ICD-10-CM

## 2018-01-27 DIAGNOSIS — I272 Pulmonary hypertension, unspecified: Secondary | ICD-10-CM | POA: Diagnosis not present

## 2018-01-27 DIAGNOSIS — J9811 Atelectasis: Secondary | ICD-10-CM

## 2018-01-27 DIAGNOSIS — I25810 Atherosclerosis of coronary artery bypass graft(s) without angina pectoris: Secondary | ICD-10-CM | POA: Diagnosis not present

## 2018-01-27 DIAGNOSIS — Z950 Presence of cardiac pacemaker: Secondary | ICD-10-CM | POA: Diagnosis not present

## 2018-01-27 DIAGNOSIS — Z7982 Long term (current) use of aspirin: Secondary | ICD-10-CM | POA: Diagnosis not present

## 2018-01-27 DIAGNOSIS — I5042 Chronic combined systolic (congestive) and diastolic (congestive) heart failure: Secondary | ICD-10-CM | POA: Diagnosis not present

## 2018-01-27 DIAGNOSIS — Z955 Presence of coronary angioplasty implant and graft: Secondary | ICD-10-CM | POA: Diagnosis not present

## 2018-01-27 DIAGNOSIS — I340 Nonrheumatic mitral (valve) insufficiency: Secondary | ICD-10-CM | POA: Diagnosis not present

## 2018-01-27 DIAGNOSIS — Z923 Personal history of irradiation: Secondary | ICD-10-CM | POA: Diagnosis not present

## 2018-01-27 DIAGNOSIS — Z79890 Hormone replacement therapy (postmenopausal): Secondary | ICD-10-CM | POA: Diagnosis not present

## 2018-01-27 DIAGNOSIS — Z4682 Encounter for fitting and adjustment of non-vascular catheter: Secondary | ICD-10-CM | POA: Diagnosis not present

## 2018-01-27 DIAGNOSIS — D72829 Elevated white blood cell count, unspecified: Secondary | ICD-10-CM | POA: Diagnosis present

## 2018-01-27 DIAGNOSIS — E039 Hypothyroidism, unspecified: Secondary | ICD-10-CM | POA: Diagnosis not present

## 2018-01-27 DIAGNOSIS — I10 Essential (primary) hypertension: Secondary | ICD-10-CM | POA: Diagnosis present

## 2018-01-27 DIAGNOSIS — I371 Nonrheumatic pulmonary valve insufficiency: Secondary | ICD-10-CM | POA: Diagnosis not present

## 2018-01-27 DIAGNOSIS — I081 Rheumatic disorders of both mitral and tricuspid valves: Secondary | ICD-10-CM | POA: Diagnosis not present

## 2018-01-27 DIAGNOSIS — D62 Acute posthemorrhagic anemia: Secondary | ICD-10-CM | POA: Diagnosis not present

## 2018-01-27 DIAGNOSIS — Z8571 Personal history of Hodgkin lymphoma: Secondary | ICD-10-CM | POA: Diagnosis not present

## 2018-01-27 DIAGNOSIS — I059 Rheumatic mitral valve disease, unspecified: Secondary | ICD-10-CM | POA: Diagnosis not present

## 2018-01-27 DIAGNOSIS — I2510 Atherosclerotic heart disease of native coronary artery without angina pectoris: Secondary | ICD-10-CM | POA: Diagnosis not present

## 2018-01-27 DIAGNOSIS — J939 Pneumothorax, unspecified: Secondary | ICD-10-CM | POA: Diagnosis not present

## 2018-01-27 DIAGNOSIS — Y842 Radiological procedure and radiotherapy as the cause of abnormal reaction of the patient, or of later complication, without mention of misadventure at the time of the procedure: Secondary | ICD-10-CM | POA: Diagnosis present

## 2018-01-27 DIAGNOSIS — Z953 Presence of xenogenic heart valve: Secondary | ICD-10-CM

## 2018-01-27 HISTORY — DX: Presence of aortocoronary bypass graft: Z95.1

## 2018-01-27 HISTORY — PX: MITRAL VALVE REPLACEMENT: SHX147

## 2018-01-27 HISTORY — PX: TEE WITHOUT CARDIOVERSION: SHX5443

## 2018-01-27 HISTORY — DX: Presence of xenogenic heart valve: Z95.3

## 2018-01-27 HISTORY — PX: CORONARY ARTERY BYPASS GRAFT: SHX141

## 2018-01-27 LAB — POCT I-STAT, CHEM 8
BUN: 12 mg/dL (ref 8–23)
BUN: 10 mg/dL (ref 8–23)
BUN: 11 mg/dL (ref 8–23)
BUN: 12 mg/dL (ref 8–23)
BUN: 12 mg/dL (ref 8–23)
BUN: 13 mg/dL (ref 8–23)
BUN: 12 mg/dL (ref 8–23)
BUN: 14 mg/dL (ref 8–23)
CHLORIDE: 102 mmol/L (ref 98–111)
CHLORIDE: 105 mmol/L (ref 98–111)
Calcium, Ion: 1.25 mmol/L (ref 1.15–1.40)
Calcium, Ion: 1.05 mmol/L — ABNORMAL LOW (ref 1.15–1.40)
Calcium, Ion: 1.21 mmol/L (ref 1.15–1.40)
Calcium, Ion: 1.08 mmol/L — ABNORMAL LOW (ref 1.15–1.40)
Calcium, Ion: 1.1 mmol/L — ABNORMAL LOW (ref 1.15–1.40)
Calcium, Ion: 1.1 mmol/L — ABNORMAL LOW (ref 1.15–1.40)
Calcium, Ion: 1.07 mmol/L — ABNORMAL LOW (ref 1.15–1.40)
Calcium, Ion: 1.09 mmol/L — ABNORMAL LOW (ref 1.15–1.40)
Chloride: 104 mmol/L (ref 98–111)
Chloride: 103 mmol/L (ref 98–111)
Chloride: 105 mmol/L (ref 98–111)
Chloride: 105 mmol/L (ref 98–111)
Chloride: 107 mmol/L (ref 98–111)
Chloride: 111 mmol/L (ref 98–111)
Creatinine, Ser: 0.9 mg/dL (ref 0.61–1.24)
Creatinine, Ser: 0.8 mg/dL (ref 0.61–1.24)
Creatinine, Ser: 0.8 mg/dL (ref 0.61–1.24)
Creatinine, Ser: 0.8 mg/dL (ref 0.61–1.24)
Creatinine, Ser: 0.9 mg/dL (ref 0.61–1.24)
Creatinine, Ser: 0.9 mg/dL (ref 0.61–1.24)
Creatinine, Ser: 0.8 mg/dL (ref 0.61–1.24)
Creatinine, Ser: 0.9 mg/dL (ref 0.61–1.24)
GLUCOSE: 103 mg/dL — AB (ref 70–99)
Glucose, Bld: 109 mg/dL — ABNORMAL HIGH (ref 70–99)
Glucose, Bld: 103 mg/dL — ABNORMAL HIGH (ref 70–99)
Glucose, Bld: 116 mg/dL — ABNORMAL HIGH (ref 70–99)
Glucose, Bld: 178 mg/dL — ABNORMAL HIGH (ref 70–99)
Glucose, Bld: 143 mg/dL — ABNORMAL HIGH (ref 70–99)
Glucose, Bld: 158 mg/dL — ABNORMAL HIGH (ref 70–99)
Glucose, Bld: 121 mg/dL — ABNORMAL HIGH (ref 70–99)
HCT: 36 % — ABNORMAL LOW (ref 39.0–52.0)
HCT: 30 % — ABNORMAL LOW (ref 39.0–52.0)
HCT: 32 % — ABNORMAL LOW (ref 39.0–52.0)
HCT: 26 % — ABNORMAL LOW (ref 39.0–52.0)
HCT: 26 % — ABNORMAL LOW (ref 39.0–52.0)
HCT: 27 % — ABNORMAL LOW (ref 39.0–52.0)
HCT: 30 % — ABNORMAL LOW (ref 39.0–52.0)
HEMATOCRIT: 29 % — AB (ref 39.0–52.0)
Hemoglobin: 12.2 g/dL — ABNORMAL LOW (ref 13.0–17.0)
Hemoglobin: 10.2 g/dL — ABNORMAL LOW (ref 13.0–17.0)
Hemoglobin: 10.9 g/dL — ABNORMAL LOW (ref 13.0–17.0)
Hemoglobin: 8.8 g/dL — ABNORMAL LOW (ref 13.0–17.0)
Hemoglobin: 8.8 g/dL — ABNORMAL LOW (ref 13.0–17.0)
Hemoglobin: 9.2 g/dL — ABNORMAL LOW (ref 13.0–17.0)
Hemoglobin: 10.2 g/dL — ABNORMAL LOW (ref 13.0–17.0)
Hemoglobin: 9.9 g/dL — ABNORMAL LOW (ref 13.0–17.0)
POTASSIUM: 4.1 mmol/L (ref 3.5–5.1)
POTASSIUM: 4.2 mmol/L (ref 3.5–5.1)
Potassium: 4.2 mmol/L (ref 3.5–5.1)
Potassium: 4.7 mmol/L (ref 3.5–5.1)
Potassium: 4.3 mmol/L (ref 3.5–5.1)
Potassium: 4.6 mmol/L (ref 3.5–5.1)
Potassium: 3.7 mmol/L (ref 3.5–5.1)
Potassium: 4.1 mmol/L (ref 3.5–5.1)
Sodium: 141 mmol/L (ref 135–145)
Sodium: 139 mmol/L (ref 135–145)
Sodium: 140 mmol/L (ref 135–145)
Sodium: 134 mmol/L — ABNORMAL LOW (ref 135–145)
Sodium: 138 mmol/L (ref 135–145)
Sodium: 138 mmol/L (ref 135–145)
Sodium: 142 mmol/L (ref 135–145)
Sodium: 141 mmol/L (ref 135–145)
TCO2: 30 mmol/L (ref 22–32)
TCO2: 28 mmol/L (ref 22–32)
TCO2: 29 mmol/L (ref 22–32)
TCO2: 27 mmol/L (ref 22–32)
TCO2: 27 mmol/L (ref 22–32)
TCO2: 28 mmol/L (ref 22–32)
TCO2: 25 mmol/L (ref 22–32)
TCO2: 21 mmol/L — ABNORMAL LOW (ref 22–32)

## 2018-01-27 LAB — POCT I-STAT 3, ART BLOOD GAS (G3+)
Acid-Base Excess: 4 mmol/L — ABNORMAL HIGH (ref 0.0–2.0)
Acid-base deficit: 1 mmol/L (ref 0.0–2.0)
Acid-base deficit: 4 mmol/L — ABNORMAL HIGH (ref 0.0–2.0)
Acid-base deficit: 2 mmol/L (ref 0.0–2.0)
BICARBONATE: 29.6 mmol/L — AB (ref 20.0–28.0)
Bicarbonate: 24.4 mmol/L (ref 20.0–28.0)
Bicarbonate: 24 mmol/L (ref 20.0–28.0)
Bicarbonate: 23.5 mmol/L (ref 20.0–28.0)
O2 Saturation: 100 %
O2 Saturation: 99 %
O2 Saturation: 96 %
O2 Saturation: 99 %
PO2 ART: 93 mmHg (ref 83.0–108.0)
PO2 ART: 120 mmHg — AB (ref 83.0–108.0)
Patient temperature: 37
Patient temperature: 37
TCO2: 31 mmol/L (ref 22–32)
TCO2: 26 mmol/L (ref 22–32)
TCO2: 26 mmol/L (ref 22–32)
TCO2: 25 mmol/L (ref 22–32)
pCO2 arterial: 46.3 mmHg (ref 32.0–48.0)
pCO2 arterial: 44.9 mmHg (ref 32.0–48.0)
pCO2 arterial: 53.7 mmHg — ABNORMAL HIGH (ref 32.0–48.0)
pCO2 arterial: 40.9 mmHg (ref 32.0–48.0)
pH, Arterial: 7.413 (ref 7.350–7.450)
pH, Arterial: 7.344 — ABNORMAL LOW (ref 7.350–7.450)
pH, Arterial: 7.257 — ABNORMAL LOW (ref 7.350–7.450)
pH, Arterial: 7.368 (ref 7.350–7.450)
pO2, Arterial: 373 mmHg — ABNORMAL HIGH (ref 83.0–108.0)
pO2, Arterial: 143 mmHg — ABNORMAL HIGH (ref 83.0–108.0)

## 2018-01-27 LAB — CBC WITH DIFFERENTIAL/PLATELET
Abs Immature Granulocytes: 0.06 10*3/uL (ref 0.00–0.07)
Basophils Absolute: 0 10*3/uL (ref 0.0–0.1)
Basophils Relative: 0 %
Eosinophils Absolute: 0 10*3/uL (ref 0.0–0.5)
Eosinophils Relative: 0 %
HCT: 32 % — ABNORMAL LOW (ref 39.0–52.0)
HEMOGLOBIN: 10.9 g/dL — AB (ref 13.0–17.0)
Immature Granulocytes: 1 %
Lymphocytes Relative: 2 %
Lymphs Abs: 0.3 10*3/uL — ABNORMAL LOW (ref 0.7–4.0)
MCH: 31.9 pg (ref 26.0–34.0)
MCHC: 34.1 g/dL (ref 30.0–36.0)
MCV: 93.6 fL (ref 80.0–100.0)
MONOS PCT: 6 %
Monocytes Absolute: 0.8 10*3/uL (ref 0.1–1.0)
Neutro Abs: 12.2 10*3/uL — ABNORMAL HIGH (ref 1.7–7.7)
Neutrophils Relative %: 91 %
Platelets: 153 10*3/uL (ref 150–400)
RBC: 3.42 MIL/uL — ABNORMAL LOW (ref 4.22–5.81)
RDW: 13 % (ref 11.5–15.5)
WBC: 13.3 10*3/uL — ABNORMAL HIGH (ref 4.0–10.5)
nRBC: 0 % (ref 0.0–0.2)

## 2018-01-27 LAB — COOXEMETRY PANEL
Carboxyhemoglobin: 1.8 % — ABNORMAL HIGH (ref 0.5–1.5)
Methemoglobin: 1.3 % (ref 0.0–1.5)
O2 Saturation: 73.5 %
Total hemoglobin: 11.4 g/dL — ABNORMAL LOW (ref 12.0–16.0)

## 2018-01-27 LAB — CREATININE, SERUM
Creatinine, Ser: 1.12 mg/dL (ref 0.61–1.24)
GFR calc Af Amer: >60 mL/min (ref >60)
GFR calc non Af Amer: >60 mL/min (ref >60)

## 2018-01-27 LAB — CBC
HCT: 37.4 % — ABNORMAL LOW (ref 39.0–52.0)
Hemoglobin: 12.6 g/dL — ABNORMAL LOW (ref 13.0–17.0)
MCH: 31.3 pg (ref 26.0–34.0)
MCHC: 33.7 g/dL (ref 30.0–36.0)
MCV: 92.8 fL (ref 80.0–100.0)
PLATELETS: 186 10*3/uL (ref 150–400)
RBC: 4.03 MIL/uL — ABNORMAL LOW (ref 4.22–5.81)
RDW: 13 % (ref 11.5–15.5)
WBC: 18.5 10*3/uL — ABNORMAL HIGH (ref 4.0–10.5)
nRBC: 0 % (ref 0.0–0.2)

## 2018-01-27 LAB — GLUCOSE, CAPILLARY
GLUCOSE-CAPILLARY: 78 mg/dL (ref 70–99)
GLUCOSE-CAPILLARY: 92 mg/dL (ref 70–99)
Glucose-Capillary: 100 mg/dL — ABNORMAL HIGH (ref 70–99)
Glucose-Capillary: 112 mg/dL — ABNORMAL HIGH (ref 70–99)

## 2018-01-27 LAB — POCT I-STAT 4, (NA,K, GLUC, HGB,HCT)
Glucose, Bld: 104 mg/dL — ABNORMAL HIGH (ref 70–99)
HCT: 33 % — ABNORMAL LOW (ref 39.0–52.0)
Hemoglobin: 11.2 g/dL — ABNORMAL LOW (ref 13.0–17.0)
Potassium: 4.2 mmol/L (ref 3.5–5.1)
Sodium: 143 mmol/L (ref 135–145)

## 2018-01-27 LAB — APTT: aPTT: 39 seconds — ABNORMAL HIGH (ref 24–36)

## 2018-01-27 LAB — PREPARE RBC (CROSSMATCH)

## 2018-01-27 LAB — MAGNESIUM: Magnesium: 3.3 mg/dL — ABNORMAL HIGH (ref 1.7–2.4)

## 2018-01-27 LAB — PROTIME-INR
INR: 1.58
Prothrombin Time: 18.6 seconds — ABNORMAL HIGH (ref 11.4–15.2)

## 2018-01-27 LAB — HEMOGLOBIN AND HEMATOCRIT, BLOOD
HCT: 29.9 % — ABNORMAL LOW (ref 39.0–52.0)
Hemoglobin: 10.3 g/dL — ABNORMAL LOW (ref 13.0–17.0)

## 2018-01-27 LAB — PLATELET COUNT: Platelets: 161 10*3/uL (ref 150–400)

## 2018-01-27 SURGERY — CORONARY ARTERY BYPASS GRAFTING (CABG)
Anesthesia: General | Site: Chest

## 2018-01-27 MED ORDER — ALBUMIN HUMAN 5 % IV SOLN
12.5000 g | Freq: Once | INTRAVENOUS | Status: AC
  Administered 2018-01-27: 12.5 g via INTRAVENOUS

## 2018-01-27 MED ORDER — SODIUM BICARBONATE 8.4 % IV SOLN
50.0000 meq | Freq: Once | INTRAVENOUS | Status: AC
  Administered 2018-01-27: 50 meq via INTRAVENOUS

## 2018-01-27 MED ORDER — INSULIN REGULAR(HUMAN) IN NACL 100-0.9 UT/100ML-% IV SOLN: INTRAVENOUS | Status: DC

## 2018-01-27 MED ORDER — POTASSIUM CHLORIDE 10 MEQ/50ML IV SOLN: 10.0000 meq | INTRAVENOUS | Status: AC

## 2018-01-27 MED ORDER — FENTANYL CITRATE (PF) 250 MCG/5ML IJ SOLN
INTRAMUSCULAR | Status: DC | PRN
  Administered 2018-01-27: 100 ug via INTRAVENOUS
  Administered 2018-01-27 (×3): 250 ug via INTRAVENOUS
  Administered 2018-01-27 (×2): 50 ug via INTRAVENOUS
  Administered 2018-01-27: 250 ug via INTRAVENOUS
  Administered 2018-01-27: 50 ug via INTRAVENOUS

## 2018-01-27 MED ORDER — ROCURONIUM BROMIDE 50 MG/5ML IV SOSY
PREFILLED_SYRINGE | INTRAVENOUS | Status: AC
  Filled 2018-01-27: qty 15

## 2018-01-27 MED ORDER — MILRINONE LACTATE IN DEXTROSE 20-5 MG/100ML-% IV SOLN
0.0000 ug/kg/min | INTRAVENOUS | Status: DC
  Administered 2018-01-28: 0.3 ug/kg/min via INTRAVENOUS
  Filled 2018-01-27: qty 100

## 2018-01-27 MED ORDER — METOPROLOL TARTRATE 5 MG/5ML IV SOLN: 2.5000 mg | INTRAVENOUS | Status: DC | PRN

## 2018-01-27 MED ORDER — BISACODYL 10 MG RE SUPP: 10.0000 mg | Freq: Every day | RECTAL | Status: DC

## 2018-01-27 MED ORDER — SODIUM CHLORIDE 0.9 % IV SOLN
INTRAVENOUS | Status: DC
  Administered 2018-01-27 (×2): via INTRAVENOUS

## 2018-01-27 MED ORDER — PANTOPRAZOLE SODIUM 40 MG PO TBEC
40.0000 mg | DELAYED_RELEASE_TABLET | Freq: Every day | ORAL | Status: DC
  Administered 2018-01-29 – 2018-02-01 (×4): 40 mg via ORAL
  Filled 2018-01-27 (×4): qty 1

## 2018-01-27 MED ORDER — OXYCODONE HCL 5 MG PO TABS
5.0000 mg | ORAL_TABLET | ORAL | Status: DC | PRN
  Administered 2018-01-28: 10 mg via ORAL
  Filled 2018-01-27: qty 2

## 2018-01-27 MED ORDER — TRAMADOL HCL 50 MG PO TABS
50.0000 mg | ORAL_TABLET | ORAL | Status: DC | PRN
  Administered 2018-01-28 (×2): 100 mg via ORAL
  Filled 2018-01-27 (×2): qty 2

## 2018-01-27 MED ORDER — LACTATED RINGERS IV SOLN: INTRAVENOUS | Status: DC

## 2018-01-27 MED ORDER — CHLORHEXIDINE GLUCONATE 0.12% ORAL RINSE (MEDLINE KIT)
15.0000 mL | Freq: Two times a day (BID) | OROMUCOSAL | Status: DC
  Administered 2018-01-27: 15 mL via OROMUCOSAL

## 2018-01-27 MED ORDER — ALBUMIN HUMAN 5 % IV SOLN
INTRAVENOUS | Status: DC | PRN
  Administered 2018-01-27: 14:00:00 via INTRAVENOUS

## 2018-01-27 MED ORDER — ROCURONIUM BROMIDE 50 MG/5ML IV SOSY
PREFILLED_SYRINGE | INTRAVENOUS | Status: AC
  Filled 2018-01-27: qty 5

## 2018-01-27 MED ORDER — ARTIFICIAL TEARS OPHTHALMIC OINT
TOPICAL_OINTMENT | OPHTHALMIC | Status: DC | PRN
  Administered 2018-01-27: 1 via OPHTHALMIC

## 2018-01-27 MED ORDER — HEPARIN SODIUM (PORCINE) 1000 UNIT/ML IJ SOLN
INTRAMUSCULAR | Status: DC | PRN
  Administered 2018-01-27: 28000 [IU] via INTRAVENOUS

## 2018-01-27 MED ORDER — LACTATED RINGERS IV SOLN
INTRAVENOUS | Status: DC | PRN
  Administered 2018-01-27: 08:00:00 via INTRAVENOUS

## 2018-01-27 MED ORDER — CHLORHEXIDINE GLUCONATE 4 % EX LIQD: 30.0000 mL | CUTANEOUS | Status: DC

## 2018-01-27 MED ORDER — SODIUM CHLORIDE 0.9% FLUSH
10.0000 mL | Freq: Two times a day (BID) | INTRAVENOUS | Status: DC
  Administered 2018-01-27 – 2018-01-30 (×3): 10 mL

## 2018-01-27 MED ORDER — MAGNESIUM SULFATE 4 GM/100ML IV SOLN
4.0000 g | Freq: Once | INTRAVENOUS | Status: AC
  Administered 2018-01-27: 4 g via INTRAVENOUS
  Filled 2018-01-27: qty 100

## 2018-01-27 MED ORDER — LIDOCAINE 2% (20 MG/ML) 5 ML SYRINGE
INTRAMUSCULAR | Status: AC
  Filled 2018-01-27: qty 5

## 2018-01-27 MED ORDER — KENNESTONE BLOOD CARDIOPLEGIA VIAL
13.0000 mL | Freq: Once | Status: DC
  Filled 2018-01-27: qty 13

## 2018-01-27 MED ORDER — ACETAMINOPHEN 650 MG RE SUPP
650.0000 mg | Freq: Once | RECTAL | Status: AC
  Administered 2018-01-27: 650 mg via RECTAL

## 2018-01-27 MED ORDER — ASPIRIN EC 325 MG PO TBEC: 325.0000 mg | DELAYED_RELEASE_TABLET | Freq: Every day | ORAL | Status: DC

## 2018-01-27 MED ORDER — METOPROLOL TARTRATE 12.5 MG HALF TABLET
12.5000 mg | ORAL_TABLET | Freq: Once | ORAL | Status: DC
  Filled 2018-01-27: qty 1

## 2018-01-27 MED ORDER — MIDAZOLAM HCL 2 MG/2ML IJ SOLN: 2.0000 mg | INTRAMUSCULAR | Status: DC | PRN

## 2018-01-27 MED ORDER — PROPOFOL 10 MG/ML IV BOLUS
INTRAVENOUS | Status: DC | PRN
  Administered 2018-01-27: 70 mg via INTRAVENOUS

## 2018-01-27 MED ORDER — SODIUM CHLORIDE 0.9 % IR SOLN
Status: DC | PRN
  Administered 2018-01-27: 6000 mL

## 2018-01-27 MED ORDER — SODIUM CHLORIDE 0.9% FLUSH: 3.0000 mL | INTRAVENOUS | Status: DC | PRN

## 2018-01-27 MED ORDER — PLASMA-LYTE 148 IV SOLN
INTRAVENOUS | Status: DC | PRN
  Administered 2018-01-27: 10:00:00 via INTRAVASCULAR

## 2018-01-27 MED ORDER — PROPOFOL 10 MG/ML IV BOLUS
INTRAVENOUS | Status: AC
  Filled 2018-01-27: qty 20

## 2018-01-27 MED ORDER — KENNESTONE BLOOD CARDIOPLEGIA (KBC) MANNITOL SYRINGE (20%, 32ML)
32.0000 mL | Freq: Once | INTRAVENOUS | Status: DC
  Filled 2018-01-27: qty 32

## 2018-01-27 MED ORDER — DOCUSATE SODIUM 100 MG PO CAPS
200.0000 mg | ORAL_CAPSULE | Freq: Every day | ORAL | Status: DC
  Administered 2018-01-28 – 2018-01-29 (×2): 200 mg via ORAL
  Filled 2018-01-27 (×4): qty 2

## 2018-01-27 MED ORDER — SODIUM CHLORIDE 0.9% IV SOLUTION: Freq: Once | INTRAVENOUS | Status: DC

## 2018-01-27 MED ORDER — ACETAMINOPHEN 500 MG PO TABS
1000.0000 mg | ORAL_TABLET | Freq: Four times a day (QID) | ORAL | Status: DC
  Administered 2018-01-28: 1000 mg via ORAL
  Filled 2018-01-27: qty 2

## 2018-01-27 MED ORDER — NITROGLYCERIN IN D5W 200-5 MCG/ML-% IV SOLN: 0.0000 ug/min | INTRAVENOUS | Status: DC

## 2018-01-27 MED ORDER — SODIUM CHLORIDE (PF) 0.9 % IJ SOLN
OROMUCOSAL | Status: DC | PRN
  Administered 2018-01-27 (×4): via TOPICAL

## 2018-01-27 MED ORDER — SODIUM CHLORIDE 0.9 % IV SOLN
1.5000 g | Freq: Two times a day (BID) | INTRAVENOUS | Status: DC
  Administered 2018-01-27 – 2018-01-29 (×4): 1.5 g via INTRAVENOUS
  Filled 2018-01-27 (×4): qty 1.5

## 2018-01-27 MED ORDER — MIDAZOLAM HCL (PF) 10 MG/2ML IJ SOLN
INTRAMUSCULAR | Status: AC
  Filled 2018-01-27: qty 2

## 2018-01-27 MED ORDER — ACETAMINOPHEN 160 MG/5ML PO SOLN
1000.0000 mg | Freq: Four times a day (QID) | ORAL | Status: DC
  Administered 2018-01-27: 1000 mg
  Filled 2018-01-27: qty 40.6

## 2018-01-27 MED ORDER — SODIUM CHLORIDE 0.9 % IV SOLN
INTRAVENOUS | Status: DC
  Administered 2018-01-27: 17:00:00 via INTRAVENOUS

## 2018-01-27 MED ORDER — LACTATED RINGERS IV SOLN
500.0000 mL | Freq: Once | INTRAVENOUS | Status: AC | PRN
  Administered 2018-01-27: 500 mL via INTRAVENOUS

## 2018-01-27 MED ORDER — DEXMEDETOMIDINE HCL IN NACL 200 MCG/50ML IV SOLN
0.0000 ug/kg/h | INTRAVENOUS | Status: DC
  Administered 2018-01-27 (×2): 0.7 ug/kg/h via INTRAVENOUS
  Filled 2018-01-27 (×2): qty 50

## 2018-01-27 MED ORDER — PHENYLEPHRINE HCL-NACL 20-0.9 MG/250ML-% IV SOLN
0.0000 ug/min | INTRAVENOUS | Status: DC
  Filled 2018-01-27: qty 250

## 2018-01-27 MED ORDER — ORAL CARE MOUTH RINSE
15.0000 mL | OROMUCOSAL | Status: DC
  Administered 2018-01-27 – 2018-01-28 (×2): 15 mL via OROMUCOSAL

## 2018-01-27 MED ORDER — ONDANSETRON HCL 4 MG/2ML IJ SOLN: 4.0000 mg | Freq: Four times a day (QID) | INTRAMUSCULAR | Status: DC | PRN

## 2018-01-27 MED ORDER — PROTAMINE SULFATE 10 MG/ML IV SOLN
INTRAVENOUS | Status: AC
  Filled 2018-01-27: qty 25

## 2018-01-27 MED ORDER — MIDAZOLAM HCL 5 MG/5ML IJ SOLN
INTRAMUSCULAR | Status: DC | PRN
  Administered 2018-01-27 (×3): 1 mg via INTRAVENOUS
  Administered 2018-01-27 (×2): 3 mg via INTRAVENOUS
  Administered 2018-01-27: 1 mg via INTRAVENOUS

## 2018-01-27 MED ORDER — ASPIRIN 81 MG PO CHEW: 324.0000 mg | CHEWABLE_TABLET | Freq: Every day | ORAL | Status: DC

## 2018-01-27 MED ORDER — SODIUM CHLORIDE 0.45 % IV SOLN
INTRAVENOUS | Status: DC | PRN
  Administered 2018-01-27: 17:00:00 via INTRAVENOUS

## 2018-01-27 MED ORDER — VANCOMYCIN HCL 1000 MG IV SOLR
INTRAVENOUS | Status: DC | PRN
  Administered 2018-01-27: 11:00:00

## 2018-01-27 MED ORDER — VANCOMYCIN HCL IN DEXTROSE 1-5 GM/200ML-% IV SOLN
1000.0000 mg | Freq: Once | INTRAVENOUS | Status: AC
  Administered 2018-01-27: 1000 mg via INTRAVENOUS
  Filled 2018-01-27: qty 200

## 2018-01-27 MED ORDER — SODIUM CHLORIDE 0.9% FLUSH
3.0000 mL | Freq: Two times a day (BID) | INTRAVENOUS | Status: DC
  Administered 2018-01-28 – 2018-01-31 (×8): 3 mL via INTRAVENOUS

## 2018-01-27 MED ORDER — FENTANYL CITRATE (PF) 250 MCG/5ML IJ SOLN
INTRAMUSCULAR | Status: AC
  Filled 2018-01-27: qty 25

## 2018-01-27 MED ORDER — GLUTARALDEHYDE 0.625% SOAKING SOLUTION: TOPICAL | Status: DC

## 2018-01-27 MED ORDER — ROCURONIUM BROMIDE 10 MG/ML (PF) SYRINGE
PREFILLED_SYRINGE | INTRAVENOUS | Status: DC | PRN
  Administered 2018-01-27 (×5): 50 mg via INTRAVENOUS

## 2018-01-27 MED ORDER — HEPARIN SODIUM (PORCINE) 1000 UNIT/ML IJ SOLN
INTRAMUSCULAR | Status: AC
  Filled 2018-01-27: qty 1

## 2018-01-27 MED ORDER — LEVOTHYROXINE SODIUM 75 MCG PO TABS
75.0000 ug | ORAL_TABLET | Freq: Every day | ORAL | Status: DC
  Administered 2018-01-28 – 2018-02-01 (×5): 75 ug via ORAL
  Filled 2018-01-27 (×5): qty 1

## 2018-01-27 MED ORDER — ROCURONIUM BROMIDE 100 MG/10ML IV SOLN: INTRAVENOUS | Status: DC | PRN

## 2018-01-27 MED ORDER — PROTAMINE SULFATE 10 MG/ML IV SOLN
INTRAVENOUS | Status: DC | PRN
  Administered 2018-01-27: 280 mg via INTRAVENOUS

## 2018-01-27 MED ORDER — SODIUM CHLORIDE 0.9% FLUSH
10.0000 mL | INTRAVENOUS | Status: DC | PRN
  Administered 2018-01-29: 10 mL
  Filled 2018-01-27: qty 40

## 2018-01-27 MED ORDER — CHLORHEXIDINE GLUCONATE 0.12 % MT SOLN
15.0000 mL | OROMUCOSAL | Status: AC
  Administered 2018-01-27: 15 mL via OROMUCOSAL

## 2018-01-27 MED ORDER — ALBUMIN HUMAN 5 % IV SOLN
INTRAVENOUS | Status: AC
  Administered 2018-01-27: 12.5 g via INTRAVENOUS
  Filled 2018-01-27: qty 250

## 2018-01-27 MED ORDER — CHLORHEXIDINE GLUCONATE CLOTH 2 % EX PADS
6.0000 | MEDICATED_PAD | Freq: Every day | CUTANEOUS | Status: DC
  Administered 2018-01-27 – 2018-01-28 (×2): 6 via TOPICAL

## 2018-01-27 MED ORDER — INSULIN REGULAR BOLUS VIA INFUSION
0.0000 [IU] | Freq: Three times a day (TID) | INTRAVENOUS | Status: DC
  Filled 2018-01-27: qty 10

## 2018-01-27 MED ORDER — ACETAMINOPHEN 160 MG/5ML PO SOLN: 650.0000 mg | Freq: Once | ORAL | Status: AC

## 2018-01-27 MED ORDER — SODIUM CHLORIDE 0.9 % IV SOLN: 250.0000 mL | INTRAVENOUS | Status: DC

## 2018-01-27 MED ORDER — INSULIN ASPART 100 UNIT/ML ~~LOC~~ SOLN
0.0000 [IU] | SUBCUTANEOUS | Status: DC
  Administered 2018-01-28: 2 [IU] via SUBCUTANEOUS

## 2018-01-27 MED ORDER — BISACODYL 5 MG PO TBEC
10.0000 mg | DELAYED_RELEASE_TABLET | Freq: Every day | ORAL | Status: DC
  Administered 2018-01-28 – 2018-01-29 (×2): 10 mg via ORAL
  Filled 2018-01-27 (×3): qty 2

## 2018-01-27 MED ORDER — CHLORHEXIDINE GLUCONATE 0.12 % MT SOLN
15.0000 mL | Freq: Once | OROMUCOSAL | Status: AC
  Administered 2018-01-27: 15 mL via OROMUCOSAL
  Filled 2018-01-27: qty 15

## 2018-01-27 MED ORDER — MORPHINE SULFATE (PF) 2 MG/ML IV SOLN
1.0000 mg | INTRAVENOUS | Status: DC | PRN
  Administered 2018-01-27 – 2018-01-28 (×4): 2 mg via INTRAVENOUS
  Filled 2018-01-27 (×4): qty 1

## 2018-01-27 MED ORDER — FAMOTIDINE IN NACL 20-0.9 MG/50ML-% IV SOLN
20.0000 mg | Freq: Two times a day (BID) | INTRAVENOUS | Status: DC
  Administered 2018-01-27: 20 mg via INTRAVENOUS
  Filled 2018-01-27: qty 50

## 2018-01-27 MED ORDER — ALBUMIN HUMAN 5 % IV SOLN
250.0000 mL | INTRAVENOUS | Status: AC | PRN
  Administered 2018-01-27 (×4): 12.5 g via INTRAVENOUS
  Filled 2018-01-27 (×2): qty 250

## 2018-01-27 SURGICAL SUPPLY — 150 items
ADAPTER CARDIO PERF ANTE/RETRO (ADAPTER) ×3 IMPLANT
APPLICATOR COTTON TIP 6IN STRL (MISCELLANEOUS) IMPLANT
BAG DECANTER FOR FLEXI CONT (MISCELLANEOUS) ×6 IMPLANT
BANDAGE ACE 4X5 VEL STRL LF (GAUZE/BANDAGES/DRESSINGS) ×3 IMPLANT
BANDAGE ACE 6X5 VEL STRL LF (GAUZE/BANDAGES/DRESSINGS) ×3 IMPLANT
BASKET HEART (ORDER IN 25'S) (MISCELLANEOUS) ×1
BASKET HEART (ORDER IN 25S) (MISCELLANEOUS) ×2 IMPLANT
BLADE CLIPPER SURG (BLADE) ×3 IMPLANT
BLADE STERNUM SYSTEM 6 (BLADE) ×3 IMPLANT
BLADE SURG 11 STRL SS (BLADE) ×3 IMPLANT
BNDG GAUZE ELAST 4 BULKY (GAUZE/BANDAGES/DRESSINGS) ×3 IMPLANT
CANISTER SUCT 3000ML PPV (MISCELLANEOUS) ×6 IMPLANT
CANN PRFSN 3/8X14X24FR PCFC (MISCELLANEOUS)
CANN PRFSN 3/8XCNCT ST RT ANG (MISCELLANEOUS)
CANNULA EZ GLIDE AORTIC 21FR (CANNULA) ×3 IMPLANT
CANNULA FEM VENOUS REMOTE 22FR (CANNULA) ×3 IMPLANT
CANNULA GUNDRY RCSP 15FR (MISCELLANEOUS) IMPLANT
CANNULA PRFSN 3/8X14X24FR PCFC (MISCELLANEOUS) IMPLANT
CANNULA PRFSN 3/8XCNCT RT ANG (MISCELLANEOUS) IMPLANT
CANNULA VEN MTL TIP RT (MISCELLANEOUS)
CANNULA VENNOUS METAL TIP 20FR (CANNULA) ×3 IMPLANT
CATH CPB KIT OWEN (MISCELLANEOUS) ×3 IMPLANT
CATH FOLEY 2WAY SLVR  5CC 14FR (CATHETERS)
CATH FOLEY 2WAY SLVR 5CC 14FR (CATHETERS) IMPLANT
CATH THORACIC 28FR RT ANG (CATHETERS) IMPLANT
CATH THORACIC 36FR (CATHETERS) ×3 IMPLANT
CLIP FOGARTY SPRING 6M (CLIP) IMPLANT
CLIP RETRACTION 3.0MM CORONARY (MISCELLANEOUS) ×3 IMPLANT
CLIP VESOCCLUDE MED 24/CT (CLIP) IMPLANT
CLIP VESOCCLUDE SM WIDE 24/CT (CLIP) IMPLANT
CONN 1/2X1/2X1/2  BEN (MISCELLANEOUS) ×1
CONN 1/2X1/2X1/2 BEN (MISCELLANEOUS) ×2 IMPLANT
CONN 3/8X1/2 ST GISH (MISCELLANEOUS) ×6 IMPLANT
CONNECTOR 1/2X3/8X1/2 3 WAY (MISCELLANEOUS) ×1
CONNECTOR 1/2X3/8X1/2 3WAY (MISCELLANEOUS) ×2 IMPLANT
CONT SPEC 4OZ CLIKSEAL STRL BL (MISCELLANEOUS) ×3 IMPLANT
COVER BACK TABLE 24X17X13 BIG (DRAPES) ×3 IMPLANT
COVER PROBE W GEL 5X96 (DRAPES) ×6 IMPLANT
COVER SURGICAL LIGHT HANDLE (MISCELLANEOUS) ×3 IMPLANT
COVER WAND RF STERILE (DRAPES) ×3 IMPLANT
CRADLE DONUT ADULT HEAD (MISCELLANEOUS) ×3 IMPLANT
DERMABOND ADVANCED (GAUZE/BANDAGES/DRESSINGS) ×1
DERMABOND ADVANCED .7 DNX12 (GAUZE/BANDAGES/DRESSINGS) ×2 IMPLANT
DEVICE SUT CK QUICK LOAD INDV (Prosthesis & Implant Heart) ×6 IMPLANT
DEVICE SUT CK QUICK LOAD MINI (Prosthesis & Implant Heart) ×18 IMPLANT
DRAIN CHANNEL 32F RND 10.7 FF (WOUND CARE) ×6 IMPLANT
DRAPE BILATERAL SPLIT (DRAPES) IMPLANT
DRAPE CARDIOVASCULAR INCISE (DRAPES) ×1
DRAPE CV SPLIT W-CLR ANES SCRN (DRAPES) IMPLANT
DRAPE HALF SHEET 40X57 (DRAPES) ×6 IMPLANT
DRAPE INCISE IOBAN 66X45 STRL (DRAPES) ×6 IMPLANT
DRAPE SLUSH/WARMER DISC (DRAPES) ×3 IMPLANT
DRAPE SRG 135X102X78XABS (DRAPES) ×2 IMPLANT
DRSG COVADERM 4X14 (GAUZE/BANDAGES/DRESSINGS) IMPLANT
ELECT BLADE 4.0 EZ CLEAN MEGAD (MISCELLANEOUS) ×3
ELECT REM PT RETURN 9FT ADLT (ELECTROSURGICAL) ×6
ELECTRODE BLDE 4.0 EZ CLN MEGD (MISCELLANEOUS) ×2 IMPLANT
ELECTRODE REM PT RTRN 9FT ADLT (ELECTROSURGICAL) ×4 IMPLANT
FELT TEFLON 1X6 (MISCELLANEOUS) ×3 IMPLANT
GAUZE SPONGE 4X4 12PLY STRL (GAUZE/BANDAGES/DRESSINGS) ×6 IMPLANT
GAUZE SPONGE 4X4 12PLY STRL LF (GAUZE/BANDAGES/DRESSINGS) ×6 IMPLANT
GLOVE BIO SURGEON STRL SZ 6 (GLOVE) ×9 IMPLANT
GLOVE BIO SURGEON STRL SZ 6.5 (GLOVE) ×18 IMPLANT
GLOVE BIO SURGEON STRL SZ7 (GLOVE) IMPLANT
GLOVE BIO SURGEON STRL SZ7.5 (GLOVE) ×6 IMPLANT
GLOVE ORTHO TXT STRL SZ7.5 (GLOVE) ×6 IMPLANT
GOWN STRL REUS W/ TWL LRG LVL3 (GOWN DISPOSABLE) ×16 IMPLANT
GOWN STRL REUS W/TWL LRG LVL3 (GOWN DISPOSABLE) ×8
HEMOSTAT POWDER SURGIFOAM 1G (HEMOSTASIS) ×9 IMPLANT
INSERT FOGARTY XLG (MISCELLANEOUS) ×3 IMPLANT
KIT BASIN OR (CUSTOM PROCEDURE TRAY) ×3 IMPLANT
KIT DILATOR VASC 18G NDL (KITS) ×3 IMPLANT
KIT DRAINAGE VACCUM ASSIST (KITS) ×3 IMPLANT
KIT SUCTION CATH 14FR (SUCTIONS) ×21 IMPLANT
KIT SUT CK MINI COMBO 4X17 (Prosthesis & Implant Heart) ×3 IMPLANT
KIT TURNOVER KIT B (KITS) ×6 IMPLANT
KIT VASOVIEW HEMOPRO 2 VH 4000 (KITS) ×3 IMPLANT
LEAD PACING MYOCARDI (MISCELLANEOUS) ×3 IMPLANT
LINE VENT (MISCELLANEOUS) ×3 IMPLANT
LOOP VESSEL SUPERMAXI WHITE (MISCELLANEOUS) ×3 IMPLANT
MARKER GRAFT CORONARY BYPASS (MISCELLANEOUS) ×6 IMPLANT
NS IRRIG 1000ML POUR BTL (IV SOLUTION) ×15 IMPLANT
PACK E OPEN HEART (SUTURE) ×3 IMPLANT
PACK OPEN HEART (CUSTOM PROCEDURE TRAY) ×3 IMPLANT
PAD ARMBOARD 7.5X6 YLW CONV (MISCELLANEOUS) ×6 IMPLANT
PAD ELECT DEFIB RADIOL ZOLL (MISCELLANEOUS) ×3 IMPLANT
PENCIL BUTTON HOLSTER BLD 10FT (ELECTRODE) ×3 IMPLANT
PUNCH AORTIC ROTATE  4.5MM 8IN (MISCELLANEOUS) ×3 IMPLANT
PUNCH AORTIC ROTATE 4.0MM (MISCELLANEOUS) IMPLANT
PUNCH AORTIC ROTATE 4.5MM 8IN (MISCELLANEOUS) IMPLANT
PUNCH AORTIC ROTATE 5MM 8IN (MISCELLANEOUS) IMPLANT
SET CARDIOPLEGIA MPS 5001102 (MISCELLANEOUS) ×3 IMPLANT
SET IRRIG TUBING LAPAROSCOPIC (IRRIGATION / IRRIGATOR) ×3 IMPLANT
SOLUTION ANTI FOG 6CC (MISCELLANEOUS) ×3 IMPLANT
SPONGE LAP 18X18 RF (DISPOSABLE) ×3 IMPLANT
SPONGE LAP 18X18 X RAY DECT (DISPOSABLE) IMPLANT
SPONGE LAP 4X18 RFD (DISPOSABLE) ×3 IMPLANT
SUCKER INTRACARDIAC WEIGHTED (SUCKER) ×3 IMPLANT
SUT BONE WAX W31G (SUTURE) ×3 IMPLANT
SUT ETHIBON 2 0 V 52N 30 (SUTURE) ×9 IMPLANT
SUT ETHIBOND 2 0 SH (SUTURE) ×14 IMPLANT
SUT ETHIBOND 2 0 SH 36X2 (SUTURE) ×4 IMPLANT
SUT ETHIBOND 2 0 V4 (SUTURE) IMPLANT
SUT ETHIBOND 2 0V4 GREEN (SUTURE) IMPLANT
SUT ETHIBOND 4 0 TF (SUTURE) IMPLANT
SUT ETHIBOND 5 0 C 1 30 (SUTURE) ×3 IMPLANT
SUT ETHIBOND NAB MH 2-0 36IN (SUTURE) ×3 IMPLANT
SUT ETHIBOND X763 2 0 SH 1 (SUTURE) ×6 IMPLANT
SUT MNCRL AB 3-0 PS2 18 (SUTURE) ×6 IMPLANT
SUT MNCRL AB 4-0 PS2 18 (SUTURE) IMPLANT
SUT PDS AB 1 CTX 36 (SUTURE) ×6 IMPLANT
SUT PROLENE 2 0 SH DA (SUTURE) IMPLANT
SUT PROLENE 3 0 SH 1 (SUTURE) ×3 IMPLANT
SUT PROLENE 3 0 SH DA (SUTURE) ×3 IMPLANT
SUT PROLENE 3 0 SH1 36 (SUTURE) ×6 IMPLANT
SUT PROLENE 4 0 RB 1 (SUTURE) ×4
SUT PROLENE 4 0 SH DA (SUTURE) ×6 IMPLANT
SUT PROLENE 4-0 RB1 .5 CRCL 36 (SUTURE) ×8 IMPLANT
SUT PROLENE 5 0 C 1 36 (SUTURE) IMPLANT
SUT PROLENE 6 0 C 1 30 (SUTURE) ×3 IMPLANT
SUT PROLENE 7.0 RB 3 (SUTURE) ×12 IMPLANT
SUT PROLENE 8 0 BV175 6 (SUTURE) IMPLANT
SUT PROLENE BLUE 7 0 (SUTURE) ×3 IMPLANT
SUT PROLENE POLY MONO (SUTURE) IMPLANT
SUT SILK  1 MH (SUTURE) ×2
SUT SILK 1 MH (SUTURE) ×4 IMPLANT
SUT STEEL 6MS V (SUTURE) IMPLANT
SUT STEEL STERNAL CCS#1 18IN (SUTURE) IMPLANT
SUT STEEL SZ 6 DBL 3X14 BALL (SUTURE) IMPLANT
SUT VIC AB 1 CTX 36 (SUTURE)
SUT VIC AB 1 CTX36XBRD ANBCTR (SUTURE) IMPLANT
SUT VIC AB 2-0 CT1 27 (SUTURE) ×1
SUT VIC AB 2-0 CT1 TAPERPNT 27 (SUTURE) ×2 IMPLANT
SUT VIC AB 2-0 CTX 27 (SUTURE) IMPLANT
SUT VIC AB 3-0 SH 27 (SUTURE)
SUT VIC AB 3-0 SH 27X BRD (SUTURE) IMPLANT
SUT VIC AB 3-0 X1 27 (SUTURE) IMPLANT
SUT VICRYL 4-0 PS2 18IN ABS (SUTURE) IMPLANT
SYSTEM SAHARA CHEST DRAIN ATS (WOUND CARE) ×3 IMPLANT
TAPE CLOTH SURG 4X10 WHT LF (GAUZE/BANDAGES/DRESSINGS) ×3 IMPLANT
TAPE PAPER 2X10 WHT MICROPORE (GAUZE/BANDAGES/DRESSINGS) ×3 IMPLANT
TOWEL GREEN STERILE (TOWEL DISPOSABLE) ×3 IMPLANT
TOWEL GREEN STERILE FF (TOWEL DISPOSABLE) ×3 IMPLANT
TRAY FOLEY SLVR 16FR TEMP STAT (SET/KITS/TRAYS/PACK) ×3 IMPLANT
TUBING INSUFFLATION (TUBING) ×3 IMPLANT
TUBING INSUFFLATION 10FT LAP (TUBING) ×3 IMPLANT
UNDERPAD 30X30 (UNDERPADS AND DIAPERS) ×3 IMPLANT
VALVE MAGNA MITRAL 29MM (Prosthesis & Implant Heart) ×3 IMPLANT
VALVE MAGNA MITRAL 31MM (Prosthesis & Implant Heart) ×3 IMPLANT
WATER STERILE IRR 1000ML POUR (IV SOLUTION) ×6 IMPLANT

## 2018-01-27 NOTE — Interval H&P Note (Signed)
History and Physical Interval Note:  01/27/2018 7:02 AM  Tyler Hendrix  has presented today for surgery, with the diagnosis of MR CAD  The various methods of treatment have been discussed with the patient and family. After consideration of risks, benefits and other options for treatment, the patient has consented to  Procedure(s): MITRAL VALVE REPAIR OR REPLACEMENT (MVR) (N/A) CORONARY ARTERY BYPASS GRAFTING (CABG) (N/A) TRANSESOPHAGEAL ECHOCARDIOGRAM (TEE) (N/A) as a surgical intervention .  The patient's history has been reviewed, patient examined, no change in status, stable for surgery.  I have reviewed the patient's chart and labs.  Questions were answered to the patient's satisfaction.     Rexene Alberts

## 2018-01-27 NOTE — Op Note (Addendum)
CARDIOTHORACIC SURGERY OPERATIVE NOTE  Date of Procedure:  01/27/2018  Preoperative Diagnosis:   Severe Mitral Regurgitation  Severe Multivessel Coronary Artery Disease  Postoperative Diagnosis: Same  Procedure:   Mitral Valve Replacement   [facility] Mitral Bovine Bioprosthetic Tissue Valve (size 1mm, model #7300TFX, serial #[id number])   Coronary Artery Bypass Grafting x 2   Left Internal Mammary Artery to Distal Left Anterior Descending Coronary Artery  Saphenous Vein Graft to Distal Right Coronary Artery  Endoscopic Vein Harvest from Right Thigh   Surgeon: Valentina Gu. 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  Patient's cardiac history dates back to 2015when he initially presented with palpitations. He was followed intermittently by Dr. Debara Pickett with right bundle branch block and left anterior fascicular block. He developed complete heart block associated with syncopal episode and underwent permanent pacemaker implantation in 2017 by Dr. Rayann Heman.  Catheterization performed at that time revealed multivessel coronary artery disease for which he underwent PCI and stenting of the mid left circumflex coronary artery and the first diagonal branch of the left anterior descending coronary artery. He has been followed regularly ever since by Dr. Debara Pickett and Dr. Rayann Heman. Over the past year the patient has developed intermittent symptoms of exertional shortness of breath. Symptoms have been somewhat sporadic and typically associated with more strenuous physical exertion. 2 episodes occurred following sex and more notably quite severe, such that the patient states that it took more than 15 minutes for him to recover. He has not had associated chest pain or chest tightness. On 2 occasions he had a small amount of hemoptysis. He was seen in follow-up recently by Dr. Rayann Heman and an echocardiogram obtained November 25, 2017. Left ventricular systolic function was moderately reduced with ejection fraction estimated only 40%. There was moderate to severe mitral regurgitation and mild pulmonary hypertension. The patient subsequently underwent left and right heart catheterization on December 02, 2017. There was felt to be mild left ventricular systolic dysfunction with ejection fraction estimated 45 to 50%. There was felt to be mild to moderate mitral regurgitation with mild pulmonary hypertension. There was multivessel coronary artery disease with 80% stenosis of the mid left anterior descending coronary artery. There was long segment 50% stenosis of the mid right coronary artery. The stents in the left circumflex and the diagonal branch were both widely patent and free of disease. TEE was performed December 09, 2017 and confirmed the presence of severe mitral regurgitation. There was moderate left ventricular chamber enlargement with mild to moderate left ventricular systolic dysfunction. Ejection fraction was estimated 40 to 45%. There was hypokinesis of the  anterior myocardium and apical septum. Quantification of the severity of  mitral regurgitation using PISArevealed anEROof 0.67 cm corresponding to regurgitant volume calculated 113 mL. Cardiothoracic surgical consultation was requested.  The patient has been seen in consultation and counseled at length regarding the indications, risks and potential benefits of surgery.  All questions have been answered, and the patient provides full informed consent for the operation as described.    DETAILS OF THE OPERATIVE PROCEDURE  Preparation:  The patient is brought to the operating room on the above mentioned date and central monitoring was established by the anesthesia team including placement of Swan-Ganz catheter and radial arterial line. The patient is placed in the supine position on the operating table.  Intravenous antibiotics are administered. General endotracheal anesthesia is induced uneventfully. A Foley catheter is placed.  Baseline transesophageal echocardiogram was performed.  Findings were notable for normal left ventricular systolic function with at least moderate to severe mitral regurgitation.  Functional anatomy of the mitral valve appears consistent with leaflet thickening and restricted leaflet mobility.  The jet of regurgitation was central and directed posteriorly.  Aortic valve appeared normal.  Right ventricular size and function was normal.  There was mild tricuspid regurgitation.  The patient's chest, abdomen, both groins, and both lower extremities are prepared and draped in a sterile manner. A time out procedure is performed.   Surgical Approach and Conduit Harvest:  A median sternotomy incision was performed and the left internal mammary artery is dissected from the chest wall and prepared for bypass grafting. The left internal mammary artery is notably good quality conduit. Simultaneously, the greater saphenous vein is obtained from the patient's right thigh using  endoscopic vein harvest technique. The saphenous vein is notably good quality conduit. After removal of the saphenous vein, the small surgical incisions in the lower extremity are closed with absorbable suture. Following systemic heparinization, the left internal mammary artery was transected distally noted to have excellent flow.   Extracorporeal Cardiopulmonary Bypass and Myocardial Protection:  The right common femoral vein is cannulated using the Seldinger technique and a guidewire advanced into the right atrium using TEE guidance.  The patient is heparinized systemically and the femoral vein cannulated using a 22 Fr long femoral venous cannula.  The ascending aorta is cannulated for cardiopulmonary bypass.  Adequate heparinization is verified.   A retrograde cardioplegia cannula is placed through the right atrium into the coronary sinus.   The entire pre-bypass portion of the operation was notable for stable hemodynamics.  Cardiopulmonary bypass was begun and the surface of the heart is inspected.  A second venous cannula is placed directly into the superior vena cava.   A cardioplegia cannula is placed in the ascending aorta.  A temperature probe was placed in the interventricular septum.  The patient is allowed to cool to [facility] systemic temperature.  The aortic cross clamp is applied and cardioplegia is delivered initially in an antegrade fashion through the aortic root using modified [place] cold blood cardioplegia ([facility] blood cardioplegia protocol).   Supplemental cardioplegia is given retrograde through the coronary sinus catheter.  Iced saline slush is applied for topical hypothermia.  The initial cardioplegic arrest is rapid with early diastolic arrest.  Repeat doses of cardioplegia are administered intermittently throughout the entire cross clamp portion of the operation through the aortic root, through the coronary sinus catheter, and through subsequently placed vein grafts in order to  maintain completely flat electrocardiogram and septal myocardial temperature below 15C.  The initial cardioplegic arrest is rapid with early diastolic arrest. Myocardial protection was felt  to be excellent.   Coronary Artery Bypass Grafting:   The distal right coronary artery was grafted using a reversed saphenous vein graft in an end-to-side fashion.  At the site of distal anastomosis the target vessel was good quality and measured approximately 2.0 mm in diameter.  The distal left anterior coronary artery was grafted with the left internal mammary artery in an end-to-side fashion.  At the site of distal anastomosis the target vessel was good quality and measured approximately 1.8 mm in diameter.   Mitral Valve Replacement:  A left atriotomy incision is performed and extended part way across the back wall of the left atrium.  The mitral valve is exposed using a self-retaining retractor.  Of note, the posterior portion of the left atrium was densely fibrotic and thick walled, measuring in excess of 5 to 6 mm thickness.  Exposure of the mitral valve was somewhat technically challenging because of the degree of fibrosis, and the atriotomy incision was extended in both directions to allow adequate exposure.    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  The posterior leaflet was severely restricted and short in height.  There was severe fibrosis of the midportion of the anterior leaflet.  The subvalvular apparatus was not thickened or foreshortened to suggest rheumatic disease and there was no mitral stenosis.  There was minimal calcification.  The anterior leaflet of the mitral valve was excised sharply, leaving a small rim of the free margin and the associated primary chords.  The posterior leaflet split in the midline.  The mitral annulus was sized to accept a 31 mm prosthesis.  The left ventricle was irrigated  with copious cold saline solution.  Mitral valve replacement was performed using interrupted horizontal mattress 2-0 Ethibond pledgeted sutures with pledgets in the supraannular position.  The remaining portions of the anterior leaflet were incorporated into the suture line laterally, thereby preserving chords to both the anterior and posterior leaflet.  An [facility] Mitral bovine bioprosthetic tissue valve (size 31 mm, model # 7300TFX, serial # [id number]) was inserted.  The valve appeared to seat appropriately, and all sutures were secured using a Cor-knot device.  However, once the valve was secured in place and carefully inspected, it appeared that to the sutures had looped stent post near the right fibrous trigone.  The valve also appeared to be oversized.  A decision was made to remove the valve and replace it with one size smaller valve.  Each of the individual valve sutures and clips were removed.  Each pledget was retrieved.  The valve was removed.    Mitral valve replacement was again performed using identical technique with interrupted horizontal mattress 2-0 Ethibond pledgeted sutures with pledgets in the supra annular position.  Preservation of the subvalvular apparatus was left intact.  An [facility] Mitral bovine bioprosthetic tissue valve (size 29 mm, model # 7300TFX, serial # [id number]) was secured in place uneventfully.  The valve seated appropriately with care to position the commissure posts away from the left ventricular outflow tract.  All sutures were secured using a Cor-knot device.  Rewarming is begun.  The atriotomy was closed using a 2-layer closure of running 3-0 Prolene suture after placing a sump drain across the mitral valve to serve as a left ventricular vent.     Procedure Completion:  The single proximal saphenous vein anastomosis was performed directly to the ascending aorta prior to removal of the aortic cross-clamp.  The left internal mammary artery graft was opened  and left ventricular septal temperature rises rapidly.  One final dose of warm retrograde "reanimation dose" cardioplegia was administered retrograde through the coronary sinus catheter while all air was evacuated through the aortic root.  The aortic cross clamp was removed after a total cross clamp time of 177 minutes.  Epicardial pacing wires are fixed to the right ventricular outflow tract and to the right atrial appendage. The patient is rewarmed to 37C temperature. The left ventricular vent was removed.  The patient is weaned and disconnected from cardiopulmonary bypass.  The patient's rhythm at separation from bypass was AV paced.  The patient was weaned from cardiopulmonary bypass on low dose milrinone infusion. Total cardiopulmonary bypass time for the operation was 203 minutes.  Followup transesophageal echocardiogram performed after separation from bypass revealed a well-seated mitral valve prosthesis that was functioning normally.  There was felt to be trivial paravalvular leak.  Left ventricular function was unchanged from preoperatively.  Initially the right ventricular function appeared somewhat hypokinetic but this improved rapidly within several minutes and returned to preop baseline.  The aortic and superior vena cava cannula were removed uneventfully. Protamine was administered to reverse the anticoagulation. The femoral venous cannula was removed and manual pressure held on the groin for 30 minutes.  The mediastinum and pleural space were inspected for hemostasis and irrigated with saline solution. The mediastinum and the left pleural space were drained using 3 chest tubes placed through separate stab incisions inferiorly.  The soft tissues anterior to the aorta were reapproximated loosely. The sternum is closed with double strength sternal wire. The soft tissues anterior to the sternum were closed in multiple layers and the skin is closed with a running subcuticular skin closure.   The  post-bypass portion of the operation was notable for stable rhythm and hemodynamics.   No blood products were administered during the operation.   Patient Disposition:  The patient tolerated the procedure well and is transported to the surgical intensive care in stable condition. There are no intraoperative complications. All sponge instrument and needle counts are verified correct at completion of the operation.    Valentina Gu. Roxy Manns MD 01/27/2018 3:47 PM

## 2018-01-27 NOTE — Plan of Care (Signed)
  Problem: Cardiac: Goal: Will achieve and/or maintain hemodynamic stability Outcome: Progressing   Problem: Clinical Measurements: Goal: Postoperative complications will be avoided or minimized Outcome: Progressing   Problem: Respiratory: Goal: Respiratory status will improve Outcome: Progressing   Problem: Skin Integrity: Goal: Wound healing without signs and symptoms of infection Outcome: Progressing Goal: Risk for impaired skin integrity will decrease Outcome: Progressing   Problem: Urinary Elimination: Goal: Ability to achieve and maintain adequate renal perfusion and functioning will improve Outcome: Progressing

## 2018-01-27 NOTE — Progress Notes (Signed)
Per Dr Roxy Manns, set vent rate increased to 18 & recruitment maneuver done as requested.  Pt appears to tol well currently.

## 2018-01-27 NOTE — Anesthesia Procedure Notes (Signed)
Central Venous Catheter Insertion Performed by: Roberts Gaudy, MD, anesthesiologist Start/End12/18/2019 7:00 AM, 01/27/2018 7:10 AM Patient location: Pre-op. Preanesthetic checklist: patient identified, IV checked, site marked, risks and benefits discussed, surgical consent, monitors and equipment checked, pre-op evaluation, timeout performed and anesthesia consent Hand hygiene performed  and maximum sterile barriers used  PA cath was placed.Swan type:thermodilution Procedure performed without using ultrasound guided technique. Attempts: 1 Following insertion, line sutured, dressing applied and Biopatch. Patient tolerated the procedure well with no immediate complications.

## 2018-01-27 NOTE — Anesthesia Preprocedure Evaluation (Signed)
Anesthesia Evaluation  Patient identified by MRN, date of birth, ID band Patient awake    Reviewed: Allergy & Precautions, NPO status , Patient's Chart, lab work & pertinent test results  Airway Mallampati: II  TM Distance: >3 FB Neck ROM: Full    Dental  (+) Teeth Intact   Pulmonary    breath sounds clear to auscultation       Cardiovascular  Rhythm:Regular Rate:Normal + Systolic murmurs    Neuro/Psych    GI/Hepatic   Endo/Other    Renal/GU      Musculoskeletal   Abdominal   Peds  Hematology   Anesthesia Other Findings   Reproductive/Obstetrics                             Anesthesia Physical Anesthesia Plan  ASA: III  Anesthesia Plan: General   Post-op Pain Management:    Induction: Intravenous  PONV Risk Score and Plan: Ondansetron and Dexamethasone  Airway Management Planned: Oral ETT  Additional Equipment: Arterial line, CVP, PA Cath, 3D TEE and Ultrasound Guidance Line Placement  Intra-op Plan:   Post-operative Plan: Post-operative intubation/ventilation  Informed Consent: I have reviewed the patients History and Physical, chart, labs and discussed the procedure including the risks, benefits and alternatives for the proposed anesthesia with the patient or authorized representative who has indicated his/her understanding and acceptance.     Plan Discussed with: CRNA and Anesthesiologist  Anesthesia Plan Comments:         Anesthesia Quick Evaluation

## 2018-01-27 NOTE — Progress Notes (Signed)
CT Surgery  Looks good after CABG MVR but with low CI Will increase av pacing to 90 and check coox Albumin x4 given with PAD 15

## 2018-01-27 NOTE — OR Nursing (Signed)
14:45 - 45 minute call to charge nurse

## 2018-01-27 NOTE — Progress Notes (Signed)
  Echocardiogram Echocardiogram Transesophageal has been performed.  Breslyn Abdo L Androw 01/27/2018, 9:57 AM

## 2018-01-27 NOTE — H&P (Signed)
mild LV hypertrophy. EF 40% with severe hypokinesis of the anteroseptal wall and the apex. Moderate diastolic dysfunction. Normal RV size and systolic function. Mild pulmonary hypertension. Biatrial enlargement. There was moderate to severe mitral regurgitation with a degree of posterior leaflet restriction.  ------------------------------------------------------------------- Labs, prior tests, procedures, and surgery: Permanent pacemaker system implantation.  ------------------------------------------------------------------- Study data: Comparison was made to the study of 10/30/2015. Study status: Routine. Procedure: Transthoracic echocardiography. Image quality was adequate. Transthoracic echocardiography. M-mode, complete 2D, 3D, spectral Doppler, and color Doppler. Birthdate: Patient birthdate: 1955/03/13. Age: Patient is 62 yr old. Sex: Gender: male. BMI: 26 kg/m^2. Blood pressure: 138/82 Patient status: Outpatient. Study date: Study date: 11/25/2017. Study time: 08:37 AM. Location: [facility]  -------------------------------------------------------------------  ------------------------------------------------------------------- Left ventricle: The cavity size was mildly dilated. Wall thickness was increased in a pattern of mild LVH. Severe hypokinesis of the anteroseptal wall and the apex. The estimated ejection fraction was 40%. Features are consistent with a pseudonormal left ventricular filling pattern, with concomitant abnormal relaxation and increased filling pressure (grade 2 diastolic  dysfunction).  ------------------------------------------------------------------- Aortic valve: Trileaflet; mildly calcified leaflets. Doppler: There was no stenosis. There was no regurgitation.  ------------------------------------------------------------------- Aorta: Aortic root: The aortic root was normal in size. Ascending aorta: The ascending aorta was normal in size.  ------------------------------------------------------------------- Mitral valve: Mildly calcified annulus. Doppler: There was no evidence for stenosis. There was moderate to severe regurgitation. Valve area by pressure half-time: 1.85 cm^2. Indexed valve area by pressure half-time: 0.89 cm^2/m^2. Indexed valve area by continuity equation (using LVOT flow): 1.32 cm^2/m^2. Mean gradient (D): 2 mm Hg. Peak gradient (D): 8 mm Hg.  ------------------------------------------------------------------- Left atrium: The atrium was moderately to severely dilated.  ------------------------------------------------------------------- Right ventricle: The cavity size was normal. Pacer wire or catheter noted in right ventricle. Systolic function was normal.  ------------------------------------------------------------------- Pulmonic valve: Structurally normal valve. Cusp separation was normal. Doppler: Transvalvular velocity was within the normal range. There was trivial regurgitation.  ------------------------------------------------------------------- Tricuspid valve: Doppler: There was trivial regurgitation.  ------------------------------------------------------------------- Right atrium: The atrium was mildly dilated.  ------------------------------------------------------------------- Pericardium: A trivial pericardial effusion was identified.  ------------------------------------------------------------------- Systemic veins: Inferior vena cava: The vessel was normal  in size. The respirophasic diameter changes were in the normal range (= 50%), consistent with normal central venous pressure.  ------------------------------------------------------------------- Measurements  Left ventricle Value Reference LV ID, ED, PLAX chordal (H) 59.6 mm 43 - 52 LV ID, ES, PLAX chordal (H) 41.7 mm 23 - 38 LV fx shortening, PLAX chordal 30 % >=29 LV PW thickness, ED 16.5 mm ---------- IVS/LV PW ratio, ED 0.85 <=1.3 Stroke volume, 2D 93 ml ---------- Stroke volume/bsa, 2D 45 ml/m^2 ---------- LV e&', lateral 5.22 cm/s ---------- LV E/e&', lateral 26.44 ---------- LV e&', medial 6.96 cm/s ---------- LV E/e&', medial 19.83 ---------- LV e&', average 6.09 cm/s ---------- LV E/e&', average 22.66 ----------  Ventricular septum Value Reference IVS thickness, ED 14 mm ----------  LVOT Value Reference LVOT ID, S 26 mm ---------- LVOT area 5.31 cm^2 ---------- LVOT peak velocity, S 87.1 cm/s ---------- LVOT mean velocity, S 54.3 cm/s ---------- LVOT VTI, S 17.5 cm ----------  Aorta Value Reference Aortic root ID, ED 37 mm ---------- Ascending aorta ID, A-P, S 36 mm ----------  Left  atrium Value Reference LA ID, A-P, ES 40 mm ---------- LA ID/bsa, A-P 1.93 cm/m^2 <=2.2 LA volume, S 132 ml ---------- LA volume/bsa, S 63.8 ml/m^2 ---------- LA volume, ES, 1-p A4C 139 ml ---------- LA volume/bsa, ES, 1-p A4C 67.2 ml/m^2 ----------  Systolic Pressure 119 mmHg  LVp Diastolic Pressure 13 mmHg  LVp EDP Pressure 20 mmHg  QP/QS 1  TPVR Index 6.28 HRUI  TSVR Index 28.69 HRUI  PVR SVR Ratio 0.09  TPVR/TSVR Ratio 0.22    Transesophageal Echocardiography  Patient: Tyler Hendrix, Tyler Hendrix MR #: [id number] Study Date: 12/09/2017 Gender: M Age: 9 Height: 180.3 cm Weight: 85.7 kg BSA: 2.08 m^2 Pt. Status: Room:  SONOGRAPHER Johny Chess, RDCS, CCT ORDERING Skeet Latch, MD ADMITTING Harrell Gave, Bridgette ATTENDING Harrell Gave, Bridgette PERFORMING Harrell Gave, Bridgette  cc:  ------------------------------------------------------------------- LV EF: 40% - 45%  ------------------------------------------------------------------- Indications: Mitral regurgitation 424.0. Severe valvular disease  ------------------------------------------------------------------- Study Conclusions  - Left ventricle: The cavity size was moderately dilated. Systolic function was mildly to moderately reduced. The estimated ejection fraction was in the range of 40% to 45%. Hypokinesis of the anteroseptal myocardium. Hypokinesis of the anterior myocardium. Hypokinesis of the apical myocardium. - Mitral valve: Moderately dilated annulus. Normal thickness leaflets . There was malcoaptation of the valve leaflets. There was severe regurgitation. Effective regurgitant orifice (PISA): 0.67 cm^2. Regurgitant volume (PISA): 113 ml.  Impressions:  - Moderately reduced LV function with moderately dilated LV  and anterior/anteroseptal/apical hypokinesis. 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  ------------------------------------------------------------------- Labs, prior tests, procedures, and surgery: Permanent pacemaker system implantation.  ------------------------------------------------------------------- Study data: Study status: Routine. Consent: The risks, benefits, and alternatives to the procedure were explained to the patient and informed consent was obtained. Procedure: Initial setup. The patient was brought to the laboratory. Surface ECG leads were monitored. Sedation. Conscious sedation was administered by cardiology staff. Transesophageal echocardiography. A 3rd probe transesophageal probe was inserted by the attending cardiologistwithout difficulty. Image quality was adequate. Study completion: The patient tolerated the procedure well. There were no complications. Administered medications: Midazolam, 7mg , IV. Fentanyl, 45mcg, IV. Diagnostic transesophageal echocardiography. 2D and color Doppler. Birthdate: Patient birthdate: [birth year]. Age: Patient is 62 yr old. Sex: Gender: male. BMI: 26.4 kg/m^2. Blood pressure: 158/76 Patient status: Outpatient. Study date: Study date: 12/09/2017. Study time: 07:57 AM. Location: Endoscopy.  -------------------------------------------------------------------  ------------------------------------------------------------------- Left ventricle: The cavity size was moderately dilated. Systolic function was mildly to moderately reduced. The estimated ejection fraction was in the range of 40% to 45%. Regional wall motion abnormalities: Hypokinesis of the anteroseptal myocardium. Hypokinesis of the anterior myocardium. Hypokinesis of the apical myocardium.  ------------------------------------------------------------------- Aortic valve: Structurally normal valve.  Trileaflet; normal thickness leaflets. Cusp separation was normal. Doppler: There was no significant regurgitation.  ------------------------------------------------------------------- Aorta: There was mild, moderately diffuse atheromatous plaque. There was no evidence for dissection. Aortic root: The aortic root was not dilated. Ascending aorta: The ascending aorta was normal in size. Descending aorta: The descending aorta was normal in size.  ------------------------------------------------------------------- Mitral valve: Moderately dilated annulus. Normal thickness leaflets . Leaflet separation was normal. There was malcoaptation of the valve leaflets. Doppler: There was severe regurgitation.  ------------------------------------------------------------------- Left atrium: The atrium was normal in size. No evidence of thrombus in the atrial cavity or appendage. The appendage was morphologically a left appendage, multilobulated, and of normal size. Emptying velocity was normal.  ------------------------------------------------------------------- Right ventricle: The cavity size was normal. Wall thickness was normal. Pacer wire or catheter noted in right ventricle. Systolic function was normal.  ------------------------------------------------------------------- Pulmonic valve: Structurally normal valve. Doppler: There was no significant regurgitation.  ------------------------------------------------------------------- Tricuspid valve: Structurally normal valve. Leaflet separation was normal. Doppler: There was trivial regurgitation.  ------------------------------------------------------------------- Pulmonary artery: The main pulmonary artery was normal-sized.  ------------------------------------------------------------------- Right atrium: The atrium was normal in  Systolic Pressure 119 mmHg  LVp Diastolic Pressure 13 mmHg  LVp EDP Pressure 20 mmHg  QP/QS 1  TPVR Index 6.28 HRUI  TSVR Index 28.69 HRUI  PVR SVR Ratio 0.09  TPVR/TSVR Ratio 0.22    Transesophageal Echocardiography  Patient: Tyler Hendrix, Tyler Hendrix MR #: [id number] Study Date: 12/09/2017 Gender: M Age: 9 Height: 180.3 cm Weight: 85.7 kg BSA: 2.08 m^2 Pt. Status: Room:  SONOGRAPHER Johny Chess, RDCS, CCT ORDERING Skeet Latch, MD ADMITTING Harrell Gave, Bridgette ATTENDING Harrell Gave, Bridgette PERFORMING Harrell Gave, Bridgette  cc:  ------------------------------------------------------------------- LV EF: 40% - 45%  ------------------------------------------------------------------- Indications: Mitral regurgitation 424.0. Severe valvular disease  ------------------------------------------------------------------- Study Conclusions  - Left ventricle: The cavity size was moderately dilated. Systolic function was mildly to moderately reduced. The estimated ejection fraction was in the range of 40% to 45%. Hypokinesis of the anteroseptal myocardium. Hypokinesis of the anterior myocardium. Hypokinesis of the apical myocardium. - Mitral valve: Moderately dilated annulus. Normal thickness leaflets . There was malcoaptation of the valve leaflets. There was severe regurgitation. Effective regurgitant orifice (PISA): 0.67 cm^2. Regurgitant volume (PISA): 113 ml.  Impressions:  - Moderately reduced LV function with moderately dilated LV  and anterior/anteroseptal/apical hypokinesis. 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  ------------------------------------------------------------------- Labs, prior tests, procedures, and surgery: Permanent pacemaker system implantation.  ------------------------------------------------------------------- Study data: Study status: Routine. Consent: The risks, benefits, and alternatives to the procedure were explained to the patient and informed consent was obtained. Procedure: Initial setup. The patient was brought to the laboratory. Surface ECG leads were monitored. Sedation. Conscious sedation was administered by cardiology staff. Transesophageal echocardiography. A 3rd probe transesophageal probe was inserted by the attending cardiologistwithout difficulty. Image quality was adequate. Study completion: The patient tolerated the procedure well. There were no complications. Administered medications: Midazolam, 7mg , IV. Fentanyl, 45mcg, IV. Diagnostic transesophageal echocardiography. 2D and color Doppler. Birthdate: Patient birthdate: [birth year]. Age: Patient is 62 yr old. Sex: Gender: male. BMI: 26.4 kg/m^2. Blood pressure: 158/76 Patient status: Outpatient. Study date: Study date: 12/09/2017. Study time: 07:57 AM. Location: Endoscopy.  -------------------------------------------------------------------  ------------------------------------------------------------------- Left ventricle: The cavity size was moderately dilated. Systolic function was mildly to moderately reduced. The estimated ejection fraction was in the range of 40% to 45%. Regional wall motion abnormalities: Hypokinesis of the anteroseptal myocardium. Hypokinesis of the anterior myocardium. Hypokinesis of the apical myocardium.  ------------------------------------------------------------------- Aortic valve: Structurally normal valve.  Trileaflet; normal thickness leaflets. Cusp separation was normal. Doppler: There was no significant regurgitation.  ------------------------------------------------------------------- Aorta: There was mild, moderately diffuse atheromatous plaque. There was no evidence for dissection. Aortic root: The aortic root was not dilated. Ascending aorta: The ascending aorta was normal in size. Descending aorta: The descending aorta was normal in size.  ------------------------------------------------------------------- Mitral valve: Moderately dilated annulus. Normal thickness leaflets . Leaflet separation was normal. There was malcoaptation of the valve leaflets. Doppler: There was severe regurgitation.  ------------------------------------------------------------------- Left atrium: The atrium was normal in size. No evidence of thrombus in the atrial cavity or appendage. The appendage was morphologically a left appendage, multilobulated, and of normal size. Emptying velocity was normal.  ------------------------------------------------------------------- Right ventricle: The cavity size was normal. Wall thickness was normal. Pacer wire or catheter noted in right ventricle. Systolic function was normal.  ------------------------------------------------------------------- Pulmonic valve: Structurally normal valve. Doppler: There was no significant regurgitation.  ------------------------------------------------------------------- Tricuspid valve: Structurally normal valve. Leaflet separation was normal. Doppler: There was trivial regurgitation.  ------------------------------------------------------------------- Pulmonary artery: The main pulmonary artery was normal-sized.  ------------------------------------------------------------------- Right atrium: The atrium was normal in  mild LV hypertrophy. EF 40% with severe hypokinesis of the anteroseptal wall and the apex. Moderate diastolic dysfunction. Normal RV size and systolic function. Mild pulmonary hypertension. Biatrial enlargement. There was moderate to severe mitral regurgitation with a degree of posterior leaflet restriction.  ------------------------------------------------------------------- Labs, prior tests, procedures, and surgery: Permanent pacemaker system implantation.  ------------------------------------------------------------------- Study data: Comparison was made to the study of 10/30/2015. Study status: Routine. Procedure: Transthoracic echocardiography. Image quality was adequate. Transthoracic echocardiography. M-mode, complete 2D, 3D, spectral Doppler, and color Doppler. Birthdate: Patient birthdate: 1955/03/13. Age: Patient is 62 yr old. Sex: Gender: male. BMI: 26 kg/m^2. Blood pressure: 138/82 Patient status: Outpatient. Study date: Study date: 11/25/2017. Study time: 08:37 AM. Location: [facility]  -------------------------------------------------------------------  ------------------------------------------------------------------- Left ventricle: The cavity size was mildly dilated. Wall thickness was increased in a pattern of mild LVH. Severe hypokinesis of the anteroseptal wall and the apex. The estimated ejection fraction was 40%. Features are consistent with a pseudonormal left ventricular filling pattern, with concomitant abnormal relaxation and increased filling pressure (grade 2 diastolic  dysfunction).  ------------------------------------------------------------------- Aortic valve: Trileaflet; mildly calcified leaflets. Doppler: There was no stenosis. There was no regurgitation.  ------------------------------------------------------------------- Aorta: Aortic root: The aortic root was normal in size. Ascending aorta: The ascending aorta was normal in size.  ------------------------------------------------------------------- Mitral valve: Mildly calcified annulus. Doppler: There was no evidence for stenosis. There was moderate to severe regurgitation. Valve area by pressure half-time: 1.85 cm^2. Indexed valve area by pressure half-time: 0.89 cm^2/m^2. Indexed valve area by continuity equation (using LVOT flow): 1.32 cm^2/m^2. Mean gradient (D): 2 mm Hg. Peak gradient (D): 8 mm Hg.  ------------------------------------------------------------------- Left atrium: The atrium was moderately to severely dilated.  ------------------------------------------------------------------- Right ventricle: The cavity size was normal. Pacer wire or catheter noted in right ventricle. Systolic function was normal.  ------------------------------------------------------------------- Pulmonic valve: Structurally normal valve. Cusp separation was normal. Doppler: Transvalvular velocity was within the normal range. There was trivial regurgitation.  ------------------------------------------------------------------- Tricuspid valve: Doppler: There was trivial regurgitation.  ------------------------------------------------------------------- Right atrium: The atrium was mildly dilated.  ------------------------------------------------------------------- Pericardium: A trivial pericardial effusion was identified.  ------------------------------------------------------------------- Systemic veins: Inferior vena cava: The vessel was normal  in size. The respirophasic diameter changes were in the normal range (= 50%), consistent with normal central venous pressure.  ------------------------------------------------------------------- Measurements  Left ventricle Value Reference LV ID, ED, PLAX chordal (H) 59.6 mm 43 - 52 LV ID, ES, PLAX chordal (H) 41.7 mm 23 - 38 LV fx shortening, PLAX chordal 30 % >=29 LV PW thickness, ED 16.5 mm ---------- IVS/LV PW ratio, ED 0.85 <=1.3 Stroke volume, 2D 93 ml ---------- Stroke volume/bsa, 2D 45 ml/m^2 ---------- LV e&', lateral 5.22 cm/s ---------- LV E/e&', lateral 26.44 ---------- LV e&', medial 6.96 cm/s ---------- LV E/e&', medial 19.83 ---------- LV e&', average 6.09 cm/s ---------- LV E/e&', average 22.66 ----------  Ventricular septum Value Reference IVS thickness, ED 14 mm ----------  LVOT Value Reference LVOT ID, S 26 mm ---------- LVOT area 5.31 cm^2 ---------- LVOT peak velocity, S 87.1 cm/s ---------- LVOT mean velocity, S 54.3 cm/s ---------- LVOT VTI, S 17.5 cm ----------  Aorta Value Reference Aortic root ID, ED 37 mm ---------- Ascending aorta ID, A-P, S 36 mm ----------  Left  atrium Value Reference LA ID, A-P, ES 40 mm ---------- LA ID/bsa, A-P 1.93 cm/m^2 <=2.2 LA volume, S 132 ml ---------- LA volume/bsa, S 63.8 ml/m^2 ---------- LA volume, ES, 1-p A4C 139 ml ---------- LA volume/bsa, ES, 1-p A4C 67.2 ml/m^2 ----------  mild LV hypertrophy. EF 40% with severe hypokinesis of the anteroseptal wall and the apex. Moderate diastolic dysfunction. Normal RV size and systolic function. Mild pulmonary hypertension. Biatrial enlargement. There was moderate to severe mitral regurgitation with a degree of posterior leaflet restriction.  ------------------------------------------------------------------- Labs, prior tests, procedures, and surgery: Permanent pacemaker system implantation.  ------------------------------------------------------------------- Study data: Comparison was made to the study of 10/30/2015. Study status: Routine. Procedure: Transthoracic echocardiography. Image quality was adequate. Transthoracic echocardiography. M-mode, complete 2D, 3D, spectral Doppler, and color Doppler. Birthdate: Patient birthdate: 1955/03/13. Age: Patient is 62 yr old. Sex: Gender: male. BMI: 26 kg/m^2. Blood pressure: 138/82 Patient status: Outpatient. Study date: Study date: 11/25/2017. Study time: 08:37 AM. Location: [facility]  -------------------------------------------------------------------  ------------------------------------------------------------------- Left ventricle: The cavity size was mildly dilated. Wall thickness was increased in a pattern of mild LVH. Severe hypokinesis of the anteroseptal wall and the apex. The estimated ejection fraction was 40%. Features are consistent with a pseudonormal left ventricular filling pattern, with concomitant abnormal relaxation and increased filling pressure (grade 2 diastolic  dysfunction).  ------------------------------------------------------------------- Aortic valve: Trileaflet; mildly calcified leaflets. Doppler: There was no stenosis. There was no regurgitation.  ------------------------------------------------------------------- Aorta: Aortic root: The aortic root was normal in size. Ascending aorta: The ascending aorta was normal in size.  ------------------------------------------------------------------- Mitral valve: Mildly calcified annulus. Doppler: There was no evidence for stenosis. There was moderate to severe regurgitation. Valve area by pressure half-time: 1.85 cm^2. Indexed valve area by pressure half-time: 0.89 cm^2/m^2. Indexed valve area by continuity equation (using LVOT flow): 1.32 cm^2/m^2. Mean gradient (D): 2 mm Hg. Peak gradient (D): 8 mm Hg.  ------------------------------------------------------------------- Left atrium: The atrium was moderately to severely dilated.  ------------------------------------------------------------------- Right ventricle: The cavity size was normal. Pacer wire or catheter noted in right ventricle. Systolic function was normal.  ------------------------------------------------------------------- Pulmonic valve: Structurally normal valve. Cusp separation was normal. Doppler: Transvalvular velocity was within the normal range. There was trivial regurgitation.  ------------------------------------------------------------------- Tricuspid valve: Doppler: There was trivial regurgitation.  ------------------------------------------------------------------- Right atrium: The atrium was mildly dilated.  ------------------------------------------------------------------- Pericardium: A trivial pericardial effusion was identified.  ------------------------------------------------------------------- Systemic veins: Inferior vena cava: The vessel was normal  in size. The respirophasic diameter changes were in the normal range (= 50%), consistent with normal central venous pressure.  ------------------------------------------------------------------- Measurements  Left ventricle Value Reference LV ID, ED, PLAX chordal (H) 59.6 mm 43 - 52 LV ID, ES, PLAX chordal (H) 41.7 mm 23 - 38 LV fx shortening, PLAX chordal 30 % >=29 LV PW thickness, ED 16.5 mm ---------- IVS/LV PW ratio, ED 0.85 <=1.3 Stroke volume, 2D 93 ml ---------- Stroke volume/bsa, 2D 45 ml/m^2 ---------- LV e&', lateral 5.22 cm/s ---------- LV E/e&', lateral 26.44 ---------- LV e&', medial 6.96 cm/s ---------- LV E/e&', medial 19.83 ---------- LV e&', average 6.09 cm/s ---------- LV E/e&', average 22.66 ----------  Ventricular septum Value Reference IVS thickness, ED 14 mm ----------  LVOT Value Reference LVOT ID, S 26 mm ---------- LVOT area 5.31 cm^2 ---------- LVOT peak velocity, S 87.1 cm/s ---------- LVOT mean velocity, S 54.3 cm/s ---------- LVOT VTI, S 17.5 cm ----------  Aorta Value Reference Aortic root ID, ED 37 mm ---------- Ascending aorta ID, A-P, S 36 mm ----------  Left  atrium Value Reference LA ID, A-P, ES 40 mm ---------- LA ID/bsa, A-P 1.93 cm/m^2 <=2.2 LA volume, S 132 ml ---------- LA volume/bsa, S 63.8 ml/m^2 ---------- LA volume, ES, 1-p A4C 139 ml ---------- LA volume/bsa, ES, 1-p A4C 67.2 ml/m^2 ----------  mild LV hypertrophy. EF 40% with severe hypokinesis of the anteroseptal wall and the apex. Moderate diastolic dysfunction. Normal RV size and systolic function. Mild pulmonary hypertension. Biatrial enlargement. There was moderate to severe mitral regurgitation with a degree of posterior leaflet restriction.  ------------------------------------------------------------------- Labs, prior tests, procedures, and surgery: Permanent pacemaker system implantation.  ------------------------------------------------------------------- Study data: Comparison was made to the study of 10/30/2015. Study status: Routine. Procedure: Transthoracic echocardiography. Image quality was adequate. Transthoracic echocardiography. M-mode, complete 2D, 3D, spectral Doppler, and color Doppler. Birthdate: Patient birthdate: 1955/03/13. Age: Patient is 62 yr old. Sex: Gender: male. BMI: 26 kg/m^2. Blood pressure: 138/82 Patient status: Outpatient. Study date: Study date: 11/25/2017. Study time: 08:37 AM. Location: [facility]  -------------------------------------------------------------------  ------------------------------------------------------------------- Left ventricle: The cavity size was mildly dilated. Wall thickness was increased in a pattern of mild LVH. Severe hypokinesis of the anteroseptal wall and the apex. The estimated ejection fraction was 40%. Features are consistent with a pseudonormal left ventricular filling pattern, with concomitant abnormal relaxation and increased filling pressure (grade 2 diastolic  dysfunction).  ------------------------------------------------------------------- Aortic valve: Trileaflet; mildly calcified leaflets. Doppler: There was no stenosis. There was no regurgitation.  ------------------------------------------------------------------- Aorta: Aortic root: The aortic root was normal in size. Ascending aorta: The ascending aorta was normal in size.  ------------------------------------------------------------------- Mitral valve: Mildly calcified annulus. Doppler: There was no evidence for stenosis. There was moderate to severe regurgitation. Valve area by pressure half-time: 1.85 cm^2. Indexed valve area by pressure half-time: 0.89 cm^2/m^2. Indexed valve area by continuity equation (using LVOT flow): 1.32 cm^2/m^2. Mean gradient (D): 2 mm Hg. Peak gradient (D): 8 mm Hg.  ------------------------------------------------------------------- Left atrium: The atrium was moderately to severely dilated.  ------------------------------------------------------------------- Right ventricle: The cavity size was normal. Pacer wire or catheter noted in right ventricle. Systolic function was normal.  ------------------------------------------------------------------- Pulmonic valve: Structurally normal valve. Cusp separation was normal. Doppler: Transvalvular velocity was within the normal range. There was trivial regurgitation.  ------------------------------------------------------------------- Tricuspid valve: Doppler: There was trivial regurgitation.  ------------------------------------------------------------------- Right atrium: The atrium was mildly dilated.  ------------------------------------------------------------------- Pericardium: A trivial pericardial effusion was identified.  ------------------------------------------------------------------- Systemic veins: Inferior vena cava: The vessel was normal  in size. The respirophasic diameter changes were in the normal range (= 50%), consistent with normal central venous pressure.  ------------------------------------------------------------------- Measurements  Left ventricle Value Reference LV ID, ED, PLAX chordal (H) 59.6 mm 43 - 52 LV ID, ES, PLAX chordal (H) 41.7 mm 23 - 38 LV fx shortening, PLAX chordal 30 % >=29 LV PW thickness, ED 16.5 mm ---------- IVS/LV PW ratio, ED 0.85 <=1.3 Stroke volume, 2D 93 ml ---------- Stroke volume/bsa, 2D 45 ml/m^2 ---------- LV e&', lateral 5.22 cm/s ---------- LV E/e&', lateral 26.44 ---------- LV e&', medial 6.96 cm/s ---------- LV E/e&', medial 19.83 ---------- LV e&', average 6.09 cm/s ---------- LV E/e&', average 22.66 ----------  Ventricular septum Value Reference IVS thickness, ED 14 mm ----------  LVOT Value Reference LVOT ID, S 26 mm ---------- LVOT area 5.31 cm^2 ---------- LVOT peak velocity, S 87.1 cm/s ---------- LVOT mean velocity, S 54.3 cm/s ---------- LVOT VTI, S 17.5 cm ----------  Aorta Value Reference Aortic root ID, ED 37 mm ---------- Ascending aorta ID, A-P, S 36 mm ----------  Left  atrium Value Reference LA ID, A-P, ES 40 mm ---------- LA ID/bsa, A-P 1.93 cm/m^2 <=2.2 LA volume, S 132 ml ---------- LA volume/bsa, S 63.8 ml/m^2 ---------- LA volume, ES, 1-p A4C 139 ml ---------- LA volume/bsa, ES, 1-p A4C 67.2 ml/m^2 ----------  mild LV hypertrophy. EF 40% with severe hypokinesis of the anteroseptal wall and the apex. Moderate diastolic dysfunction. Normal RV size and systolic function. Mild pulmonary hypertension. Biatrial enlargement. There was moderate to severe mitral regurgitation with a degree of posterior leaflet restriction.  ------------------------------------------------------------------- Labs, prior tests, procedures, and surgery: Permanent pacemaker system implantation.  ------------------------------------------------------------------- Study data: Comparison was made to the study of 10/30/2015. Study status: Routine. Procedure: Transthoracic echocardiography. Image quality was adequate. Transthoracic echocardiography. M-mode, complete 2D, 3D, spectral Doppler, and color Doppler. Birthdate: Patient birthdate: 1955/03/13. Age: Patient is 62 yr old. Sex: Gender: male. BMI: 26 kg/m^2. Blood pressure: 138/82 Patient status: Outpatient. Study date: Study date: 11/25/2017. Study time: 08:37 AM. Location: [facility]  -------------------------------------------------------------------  ------------------------------------------------------------------- Left ventricle: The cavity size was mildly dilated. Wall thickness was increased in a pattern of mild LVH. Severe hypokinesis of the anteroseptal wall and the apex. The estimated ejection fraction was 40%. Features are consistent with a pseudonormal left ventricular filling pattern, with concomitant abnormal relaxation and increased filling pressure (grade 2 diastolic  dysfunction).  ------------------------------------------------------------------- Aortic valve: Trileaflet; mildly calcified leaflets. Doppler: There was no stenosis. There was no regurgitation.  ------------------------------------------------------------------- Aorta: Aortic root: The aortic root was normal in size. Ascending aorta: The ascending aorta was normal in size.  ------------------------------------------------------------------- Mitral valve: Mildly calcified annulus. Doppler: There was no evidence for stenosis. There was moderate to severe regurgitation. Valve area by pressure half-time: 1.85 cm^2. Indexed valve area by pressure half-time: 0.89 cm^2/m^2. Indexed valve area by continuity equation (using LVOT flow): 1.32 cm^2/m^2. Mean gradient (D): 2 mm Hg. Peak gradient (D): 8 mm Hg.  ------------------------------------------------------------------- Left atrium: The atrium was moderately to severely dilated.  ------------------------------------------------------------------- Right ventricle: The cavity size was normal. Pacer wire or catheter noted in right ventricle. Systolic function was normal.  ------------------------------------------------------------------- Pulmonic valve: Structurally normal valve. Cusp separation was normal. Doppler: Transvalvular velocity was within the normal range. There was trivial regurgitation.  ------------------------------------------------------------------- Tricuspid valve: Doppler: There was trivial regurgitation.  ------------------------------------------------------------------- Right atrium: The atrium was mildly dilated.  ------------------------------------------------------------------- Pericardium: A trivial pericardial effusion was identified.  ------------------------------------------------------------------- Systemic veins: Inferior vena cava: The vessel was normal  in size. The respirophasic diameter changes were in the normal range (= 50%), consistent with normal central venous pressure.  ------------------------------------------------------------------- Measurements  Left ventricle Value Reference LV ID, ED, PLAX chordal (H) 59.6 mm 43 - 52 LV ID, ES, PLAX chordal (H) 41.7 mm 23 - 38 LV fx shortening, PLAX chordal 30 % >=29 LV PW thickness, ED 16.5 mm ---------- IVS/LV PW ratio, ED 0.85 <=1.3 Stroke volume, 2D 93 ml ---------- Stroke volume/bsa, 2D 45 ml/m^2 ---------- LV e&', lateral 5.22 cm/s ---------- LV E/e&', lateral 26.44 ---------- LV e&', medial 6.96 cm/s ---------- LV E/e&', medial 19.83 ---------- LV e&', average 6.09 cm/s ---------- LV E/e&', average 22.66 ----------  Ventricular septum Value Reference IVS thickness, ED 14 mm ----------  LVOT Value Reference LVOT ID, S 26 mm ---------- LVOT area 5.31 cm^2 ---------- LVOT peak velocity, S 87.1 cm/s ---------- LVOT mean velocity, S 54.3 cm/s ---------- LVOT VTI, S 17.5 cm ----------  Aorta Value Reference Aortic root ID, ED 37 mm ---------- Ascending aorta ID, A-P, S 36 mm ----------  Left  atrium Value Reference LA ID, A-P, ES 40 mm ---------- LA ID/bsa, A-P 1.93 cm/m^2 <=2.2 LA volume, S 132 ml ---------- LA volume/bsa, S 63.8 ml/m^2 ---------- LA volume, ES, 1-p A4C 139 ml ---------- LA volume/bsa, ES, 1-p A4C 67.2 ml/m^2 ----------  Systolic Pressure 119 mmHg  LVp Diastolic Pressure 13 mmHg  LVp EDP Pressure 20 mmHg  QP/QS 1  TPVR Index 6.28 HRUI  TSVR Index 28.69 HRUI  PVR SVR Ratio 0.09  TPVR/TSVR Ratio 0.22    Transesophageal Echocardiography  Patient: Tyler Hendrix, Tyler Hendrix MR #: [id number] Study Date: 12/09/2017 Gender: M Age: 9 Height: 180.3 cm Weight: 85.7 kg BSA: 2.08 m^2 Pt. Status: Room:  SONOGRAPHER Johny Chess, RDCS, CCT ORDERING Skeet Latch, MD ADMITTING Harrell Gave, Bridgette ATTENDING Harrell Gave, Bridgette PERFORMING Harrell Gave, Bridgette  cc:  ------------------------------------------------------------------- LV EF: 40% - 45%  ------------------------------------------------------------------- Indications: Mitral regurgitation 424.0. Severe valvular disease  ------------------------------------------------------------------- Study Conclusions  - Left ventricle: The cavity size was moderately dilated. Systolic function was mildly to moderately reduced. The estimated ejection fraction was in the range of 40% to 45%. Hypokinesis of the anteroseptal myocardium. Hypokinesis of the anterior myocardium. Hypokinesis of the apical myocardium. - Mitral valve: Moderately dilated annulus. Normal thickness leaflets . There was malcoaptation of the valve leaflets. There was severe regurgitation. Effective regurgitant orifice (PISA): 0.67 cm^2. Regurgitant volume (PISA): 113 ml.  Impressions:  - Moderately reduced LV function with moderately dilated LV  and anterior/anteroseptal/apical hypokinesis. 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  ------------------------------------------------------------------- Labs, prior tests, procedures, and surgery: Permanent pacemaker system implantation.  ------------------------------------------------------------------- Study data: Study status: Routine. Consent: The risks, benefits, and alternatives to the procedure were explained to the patient and informed consent was obtained. Procedure: Initial setup. The patient was brought to the laboratory. Surface ECG leads were monitored. Sedation. Conscious sedation was administered by cardiology staff. Transesophageal echocardiography. A 3rd probe transesophageal probe was inserted by the attending cardiologistwithout difficulty. Image quality was adequate. Study completion: The patient tolerated the procedure well. There were no complications. Administered medications: Midazolam, 7mg , IV. Fentanyl, 45mcg, IV. Diagnostic transesophageal echocardiography. 2D and color Doppler. Birthdate: Patient birthdate: [birth year]. Age: Patient is 62 yr old. Sex: Gender: male. BMI: 26.4 kg/m^2. Blood pressure: 158/76 Patient status: Outpatient. Study date: Study date: 12/09/2017. Study time: 07:57 AM. Location: Endoscopy.  -------------------------------------------------------------------  ------------------------------------------------------------------- Left ventricle: The cavity size was moderately dilated. Systolic function was mildly to moderately reduced. The estimated ejection fraction was in the range of 40% to 45%. Regional wall motion abnormalities: Hypokinesis of the anteroseptal myocardium. Hypokinesis of the anterior myocardium. Hypokinesis of the apical myocardium.  ------------------------------------------------------------------- Aortic valve: Structurally normal valve.  Trileaflet; normal thickness leaflets. Cusp separation was normal. Doppler: There was no significant regurgitation.  ------------------------------------------------------------------- Aorta: There was mild, moderately diffuse atheromatous plaque. There was no evidence for dissection. Aortic root: The aortic root was not dilated. Ascending aorta: The ascending aorta was normal in size. Descending aorta: The descending aorta was normal in size.  ------------------------------------------------------------------- Mitral valve: Moderately dilated annulus. Normal thickness leaflets . Leaflet separation was normal. There was malcoaptation of the valve leaflets. Doppler: There was severe regurgitation.  ------------------------------------------------------------------- Left atrium: The atrium was normal in size. No evidence of thrombus in the atrial cavity or appendage. The appendage was morphologically a left appendage, multilobulated, and of normal size. Emptying velocity was normal.  ------------------------------------------------------------------- Right ventricle: The cavity size was normal. Wall thickness was normal. Pacer wire or catheter noted in right ventricle. Systolic function was normal.  ------------------------------------------------------------------- Pulmonic valve: Structurally normal valve. Doppler: There was no significant regurgitation.  ------------------------------------------------------------------- Tricuspid valve: Structurally normal valve. Leaflet separation was normal. Doppler: There was trivial regurgitation.  ------------------------------------------------------------------- Pulmonary artery: The main pulmonary artery was normal-sized.  ------------------------------------------------------------------- Right atrium: The atrium was normal in  Systolic Pressure 119 mmHg  LVp Diastolic Pressure 13 mmHg  LVp EDP Pressure 20 mmHg  QP/QS 1  TPVR Index 6.28 HRUI  TSVR Index 28.69 HRUI  PVR SVR Ratio 0.09  TPVR/TSVR Ratio 0.22    Transesophageal Echocardiography  Patient: Tyler Hendrix, Tyler Hendrix MR #: [id number] Study Date: 12/09/2017 Gender: M Age: 9 Height: 180.3 cm Weight: 85.7 kg BSA: 2.08 m^2 Pt. Status: Room:  SONOGRAPHER Johny Chess, RDCS, CCT ORDERING Skeet Latch, MD ADMITTING Harrell Gave, Bridgette ATTENDING Harrell Gave, Bridgette PERFORMING Harrell Gave, Bridgette  cc:  ------------------------------------------------------------------- LV EF: 40% - 45%  ------------------------------------------------------------------- Indications: Mitral regurgitation 424.0. Severe valvular disease  ------------------------------------------------------------------- Study Conclusions  - Left ventricle: The cavity size was moderately dilated. Systolic function was mildly to moderately reduced. The estimated ejection fraction was in the range of 40% to 45%. Hypokinesis of the anteroseptal myocardium. Hypokinesis of the anterior myocardium. Hypokinesis of the apical myocardium. - Mitral valve: Moderately dilated annulus. Normal thickness leaflets . There was malcoaptation of the valve leaflets. There was severe regurgitation. Effective regurgitant orifice (PISA): 0.67 cm^2. Regurgitant volume (PISA): 113 ml.  Impressions:  - Moderately reduced LV function with moderately dilated LV  and anterior/anteroseptal/apical hypokinesis. 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  ------------------------------------------------------------------- Labs, prior tests, procedures, and surgery: Permanent pacemaker system implantation.  ------------------------------------------------------------------- Study data: Study status: Routine. Consent: The risks, benefits, and alternatives to the procedure were explained to the patient and informed consent was obtained. Procedure: Initial setup. The patient was brought to the laboratory. Surface ECG leads were monitored. Sedation. Conscious sedation was administered by cardiology staff. Transesophageal echocardiography. A 3rd probe transesophageal probe was inserted by the attending cardiologistwithout difficulty. Image quality was adequate. Study completion: The patient tolerated the procedure well. There were no complications. Administered medications: Midazolam, 7mg , IV. Fentanyl, 45mcg, IV. Diagnostic transesophageal echocardiography. 2D and color Doppler. Birthdate: Patient birthdate: [birth year]. Age: Patient is 62 yr old. Sex: Gender: male. BMI: 26.4 kg/m^2. Blood pressure: 158/76 Patient status: Outpatient. Study date: Study date: 12/09/2017. Study time: 07:57 AM. Location: Endoscopy.  -------------------------------------------------------------------  ------------------------------------------------------------------- Left ventricle: The cavity size was moderately dilated. Systolic function was mildly to moderately reduced. The estimated ejection fraction was in the range of 40% to 45%. Regional wall motion abnormalities: Hypokinesis of the anteroseptal myocardium. Hypokinesis of the anterior myocardium. Hypokinesis of the apical myocardium.  ------------------------------------------------------------------- Aortic valve: Structurally normal valve.  Trileaflet; normal thickness leaflets. Cusp separation was normal. Doppler: There was no significant regurgitation.  ------------------------------------------------------------------- Aorta: There was mild, moderately diffuse atheromatous plaque. There was no evidence for dissection. Aortic root: The aortic root was not dilated. Ascending aorta: The ascending aorta was normal in size. Descending aorta: The descending aorta was normal in size.  ------------------------------------------------------------------- Mitral valve: Moderately dilated annulus. Normal thickness leaflets . Leaflet separation was normal. There was malcoaptation of the valve leaflets. Doppler: There was severe regurgitation.  ------------------------------------------------------------------- Left atrium: The atrium was normal in size. No evidence of thrombus in the atrial cavity or appendage. The appendage was morphologically a left appendage, multilobulated, and of normal size. Emptying velocity was normal.  ------------------------------------------------------------------- Right ventricle: The cavity size was normal. Wall thickness was normal. Pacer wire or catheter noted in right ventricle. Systolic function was normal.  ------------------------------------------------------------------- Pulmonic valve: Structurally normal valve. Doppler: There was no significant regurgitation.  ------------------------------------------------------------------- Tricuspid valve: Structurally normal valve. Leaflet separation was normal. Doppler: There was trivial regurgitation.  ------------------------------------------------------------------- Pulmonary artery: The main pulmonary artery was normal-sized.  ------------------------------------------------------------------- Right atrium: The atrium was normal in  Systolic Pressure 119 mmHg  LVp Diastolic Pressure 13 mmHg  LVp EDP Pressure 20 mmHg  QP/QS 1  TPVR Index 6.28 HRUI  TSVR Index 28.69 HRUI  PVR SVR Ratio 0.09  TPVR/TSVR Ratio 0.22    Transesophageal Echocardiography  Patient: Tyler Hendrix, Tyler Hendrix MR #: [id number] Study Date: 12/09/2017 Gender: M Age: 9 Height: 180.3 cm Weight: 85.7 kg BSA: 2.08 m^2 Pt. Status: Room:  SONOGRAPHER Johny Chess, RDCS, CCT ORDERING Skeet Latch, MD ADMITTING Harrell Gave, Bridgette ATTENDING Harrell Gave, Bridgette PERFORMING Harrell Gave, Bridgette  cc:  ------------------------------------------------------------------- LV EF: 40% - 45%  ------------------------------------------------------------------- Indications: Mitral regurgitation 424.0. Severe valvular disease  ------------------------------------------------------------------- Study Conclusions  - Left ventricle: The cavity size was moderately dilated. Systolic function was mildly to moderately reduced. The estimated ejection fraction was in the range of 40% to 45%. Hypokinesis of the anteroseptal myocardium. Hypokinesis of the anterior myocardium. Hypokinesis of the apical myocardium. - Mitral valve: Moderately dilated annulus. Normal thickness leaflets . There was malcoaptation of the valve leaflets. There was severe regurgitation. Effective regurgitant orifice (PISA): 0.67 cm^2. Regurgitant volume (PISA): 113 ml.  Impressions:  - Moderately reduced LV function with moderately dilated LV  and anterior/anteroseptal/apical hypokinesis. 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  ------------------------------------------------------------------- Labs, prior tests, procedures, and surgery: Permanent pacemaker system implantation.  ------------------------------------------------------------------- Study data: Study status: Routine. Consent: The risks, benefits, and alternatives to the procedure were explained to the patient and informed consent was obtained. Procedure: Initial setup. The patient was brought to the laboratory. Surface ECG leads were monitored. Sedation. Conscious sedation was administered by cardiology staff. Transesophageal echocardiography. A 3rd probe transesophageal probe was inserted by the attending cardiologistwithout difficulty. Image quality was adequate. Study completion: The patient tolerated the procedure well. There were no complications. Administered medications: Midazolam, 7mg , IV. Fentanyl, 45mcg, IV. Diagnostic transesophageal echocardiography. 2D and color Doppler. Birthdate: Patient birthdate: [birth year]. Age: Patient is 62 yr old. Sex: Gender: male. BMI: 26.4 kg/m^2. Blood pressure: 158/76 Patient status: Outpatient. Study date: Study date: 12/09/2017. Study time: 07:57 AM. Location: Endoscopy.  -------------------------------------------------------------------  ------------------------------------------------------------------- Left ventricle: The cavity size was moderately dilated. Systolic function was mildly to moderately reduced. The estimated ejection fraction was in the range of 40% to 45%. Regional wall motion abnormalities: Hypokinesis of the anteroseptal myocardium. Hypokinesis of the anterior myocardium. Hypokinesis of the apical myocardium.  ------------------------------------------------------------------- Aortic valve: Structurally normal valve.  Trileaflet; normal thickness leaflets. Cusp separation was normal. Doppler: There was no significant regurgitation.  ------------------------------------------------------------------- Aorta: There was mild, moderately diffuse atheromatous plaque. There was no evidence for dissection. Aortic root: The aortic root was not dilated. Ascending aorta: The ascending aorta was normal in size. Descending aorta: The descending aorta was normal in size.  ------------------------------------------------------------------- Mitral valve: Moderately dilated annulus. Normal thickness leaflets . Leaflet separation was normal. There was malcoaptation of the valve leaflets. Doppler: There was severe regurgitation.  ------------------------------------------------------------------- Left atrium: The atrium was normal in size. No evidence of thrombus in the atrial cavity or appendage. The appendage was morphologically a left appendage, multilobulated, and of normal size. Emptying velocity was normal.  ------------------------------------------------------------------- Right ventricle: The cavity size was normal. Wall thickness was normal. Pacer wire or catheter noted in right ventricle. Systolic function was normal.  ------------------------------------------------------------------- Pulmonic valve: Structurally normal valve. Doppler: There was no significant regurgitation.  ------------------------------------------------------------------- Tricuspid valve: Structurally normal valve. Leaflet separation was normal. Doppler: There was trivial regurgitation.  ------------------------------------------------------------------- Pulmonary artery: The main pulmonary artery was normal-sized.  ------------------------------------------------------------------- Right atrium: The atrium was normal in  mild LV hypertrophy. EF 40% with severe hypokinesis of the anteroseptal wall and the apex. Moderate diastolic dysfunction. Normal RV size and systolic function. Mild pulmonary hypertension. Biatrial enlargement. There was moderate to severe mitral regurgitation with a degree of posterior leaflet restriction.  ------------------------------------------------------------------- Labs, prior tests, procedures, and surgery: Permanent pacemaker system implantation.  ------------------------------------------------------------------- Study data: Comparison was made to the study of 10/30/2015. Study status: Routine. Procedure: Transthoracic echocardiography. Image quality was adequate. Transthoracic echocardiography. M-mode, complete 2D, 3D, spectral Doppler, and color Doppler. Birthdate: Patient birthdate: 1955/03/13. Age: Patient is 62 yr old. Sex: Gender: male. BMI: 26 kg/m^2. Blood pressure: 138/82 Patient status: Outpatient. Study date: Study date: 11/25/2017. Study time: 08:37 AM. Location: [facility]  -------------------------------------------------------------------  ------------------------------------------------------------------- Left ventricle: The cavity size was mildly dilated. Wall thickness was increased in a pattern of mild LVH. Severe hypokinesis of the anteroseptal wall and the apex. The estimated ejection fraction was 40%. Features are consistent with a pseudonormal left ventricular filling pattern, with concomitant abnormal relaxation and increased filling pressure (grade 2 diastolic  dysfunction).  ------------------------------------------------------------------- Aortic valve: Trileaflet; mildly calcified leaflets. Doppler: There was no stenosis. There was no regurgitation.  ------------------------------------------------------------------- Aorta: Aortic root: The aortic root was normal in size. Ascending aorta: The ascending aorta was normal in size.  ------------------------------------------------------------------- Mitral valve: Mildly calcified annulus. Doppler: There was no evidence for stenosis. There was moderate to severe regurgitation. Valve area by pressure half-time: 1.85 cm^2. Indexed valve area by pressure half-time: 0.89 cm^2/m^2. Indexed valve area by continuity equation (using LVOT flow): 1.32 cm^2/m^2. Mean gradient (D): 2 mm Hg. Peak gradient (D): 8 mm Hg.  ------------------------------------------------------------------- Left atrium: The atrium was moderately to severely dilated.  ------------------------------------------------------------------- Right ventricle: The cavity size was normal. Pacer wire or catheter noted in right ventricle. Systolic function was normal.  ------------------------------------------------------------------- Pulmonic valve: Structurally normal valve. Cusp separation was normal. Doppler: Transvalvular velocity was within the normal range. There was trivial regurgitation.  ------------------------------------------------------------------- Tricuspid valve: Doppler: There was trivial regurgitation.  ------------------------------------------------------------------- Right atrium: The atrium was mildly dilated.  ------------------------------------------------------------------- Pericardium: A trivial pericardial effusion was identified.  ------------------------------------------------------------------- Systemic veins: Inferior vena cava: The vessel was normal  in size. The respirophasic diameter changes were in the normal range (= 50%), consistent with normal central venous pressure.  ------------------------------------------------------------------- Measurements  Left ventricle Value Reference LV ID, ED, PLAX chordal (H) 59.6 mm 43 - 52 LV ID, ES, PLAX chordal (H) 41.7 mm 23 - 38 LV fx shortening, PLAX chordal 30 % >=29 LV PW thickness, ED 16.5 mm ---------- IVS/LV PW ratio, ED 0.85 <=1.3 Stroke volume, 2D 93 ml ---------- Stroke volume/bsa, 2D 45 ml/m^2 ---------- LV e&', lateral 5.22 cm/s ---------- LV E/e&', lateral 26.44 ---------- LV e&', medial 6.96 cm/s ---------- LV E/e&', medial 19.83 ---------- LV e&', average 6.09 cm/s ---------- LV E/e&', average 22.66 ----------  Ventricular septum Value Reference IVS thickness, ED 14 mm ----------  LVOT Value Reference LVOT ID, S 26 mm ---------- LVOT area 5.31 cm^2 ---------- LVOT peak velocity, S 87.1 cm/s ---------- LVOT mean velocity, S 54.3 cm/s ---------- LVOT VTI, S 17.5 cm ----------  Aorta Value Reference Aortic root ID, ED 37 mm ---------- Ascending aorta ID, A-P, S 36 mm ----------  Left  atrium Value Reference LA ID, A-P, ES 40 mm ---------- LA ID/bsa, A-P 1.93 cm/m^2 <=2.2 LA volume, S 132 ml ---------- LA volume/bsa, S 63.8 ml/m^2 ---------- LA volume, ES, 1-p A4C 139 ml ---------- LA volume/bsa, ES, 1-p A4C 67.2 ml/m^2 ----------  Systolic Pressure 119 mmHg  LVp Diastolic Pressure 13 mmHg  LVp EDP Pressure 20 mmHg  QP/QS 1  TPVR Index 6.28 HRUI  TSVR Index 28.69 HRUI  PVR SVR Ratio 0.09  TPVR/TSVR Ratio 0.22    Transesophageal Echocardiography  Patient: Tyler Hendrix, Tyler Hendrix MR #: [id number] Study Date: 12/09/2017 Gender: M Age: 9 Height: 180.3 cm Weight: 85.7 kg BSA: 2.08 m^2 Pt. Status: Room:  SONOGRAPHER Johny Chess, RDCS, CCT ORDERING Skeet Latch, MD ADMITTING Harrell Gave, Bridgette ATTENDING Harrell Gave, Bridgette PERFORMING Harrell Gave, Bridgette  cc:  ------------------------------------------------------------------- LV EF: 40% - 45%  ------------------------------------------------------------------- Indications: Mitral regurgitation 424.0. Severe valvular disease  ------------------------------------------------------------------- Study Conclusions  - Left ventricle: The cavity size was moderately dilated. Systolic function was mildly to moderately reduced. The estimated ejection fraction was in the range of 40% to 45%. Hypokinesis of the anteroseptal myocardium. Hypokinesis of the anterior myocardium. Hypokinesis of the apical myocardium. - Mitral valve: Moderately dilated annulus. Normal thickness leaflets . There was malcoaptation of the valve leaflets. There was severe regurgitation. Effective regurgitant orifice (PISA): 0.67 cm^2. Regurgitant volume (PISA): 113 ml.  Impressions:  - Moderately reduced LV function with moderately dilated LV  and anterior/anteroseptal/apical hypokinesis. 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  ------------------------------------------------------------------- Labs, prior tests, procedures, and surgery: Permanent pacemaker system implantation.  ------------------------------------------------------------------- Study data: Study status: Routine. Consent: The risks, benefits, and alternatives to the procedure were explained to the patient and informed consent was obtained. Procedure: Initial setup. The patient was brought to the laboratory. Surface ECG leads were monitored. Sedation. Conscious sedation was administered by cardiology staff. Transesophageal echocardiography. A 3rd probe transesophageal probe was inserted by the attending cardiologistwithout difficulty. Image quality was adequate. Study completion: The patient tolerated the procedure well. There were no complications. Administered medications: Midazolam, 7mg , IV. Fentanyl, 45mcg, IV. Diagnostic transesophageal echocardiography. 2D and color Doppler. Birthdate: Patient birthdate: [birth year]. Age: Patient is 62 yr old. Sex: Gender: male. BMI: 26.4 kg/m^2. Blood pressure: 158/76 Patient status: Outpatient. Study date: Study date: 12/09/2017. Study time: 07:57 AM. Location: Endoscopy.  -------------------------------------------------------------------  ------------------------------------------------------------------- Left ventricle: The cavity size was moderately dilated. Systolic function was mildly to moderately reduced. The estimated ejection fraction was in the range of 40% to 45%. Regional wall motion abnormalities: Hypokinesis of the anteroseptal myocardium. Hypokinesis of the anterior myocardium. Hypokinesis of the apical myocardium.  ------------------------------------------------------------------- Aortic valve: Structurally normal valve.  Trileaflet; normal thickness leaflets. Cusp separation was normal. Doppler: There was no significant regurgitation.  ------------------------------------------------------------------- Aorta: There was mild, moderately diffuse atheromatous plaque. There was no evidence for dissection. Aortic root: The aortic root was not dilated. Ascending aorta: The ascending aorta was normal in size. Descending aorta: The descending aorta was normal in size.  ------------------------------------------------------------------- Mitral valve: Moderately dilated annulus. Normal thickness leaflets . Leaflet separation was normal. There was malcoaptation of the valve leaflets. Doppler: There was severe regurgitation.  ------------------------------------------------------------------- Left atrium: The atrium was normal in size. No evidence of thrombus in the atrial cavity or appendage. The appendage was morphologically a left appendage, multilobulated, and of normal size. Emptying velocity was normal.  ------------------------------------------------------------------- Right ventricle: The cavity size was normal. Wall thickness was normal. Pacer wire or catheter noted in right ventricle. Systolic function was normal.  ------------------------------------------------------------------- Pulmonic valve: Structurally normal valve. Doppler: There was no significant regurgitation.  ------------------------------------------------------------------- Tricuspid valve: Structurally normal valve. Leaflet separation was normal. Doppler: There was trivial regurgitation.  ------------------------------------------------------------------- Pulmonary artery: The main pulmonary artery was normal-sized.  ------------------------------------------------------------------- Right atrium: The atrium was normal in

## 2018-01-27 NOTE — Progress Notes (Signed)
Called Dr. Prescott Gum regarding pt CI of 1.4 despite coox result of 73, adequate UOP >75, PAD 18, and CVP of 15. Ordered albumin x1 and repaeat coox at 0400. Continue weaning.

## 2018-01-27 NOTE — Anesthesia Procedure Notes (Signed)
Arterial Line Insertion Start/End12/18/2019 7:40 AM, 01/27/2018 7:45 AM Performed by: Laretta Alstrom, CRNA, CRNA  Patient location: Pre-op. Preanesthetic checklist: patient identified, IV checked, site marked, risks and benefits discussed, surgical consent, monitors and equipment checked, pre-op evaluation, timeout performed and anesthesia consent Lidocaine 1% used for infiltration Left, radial was placed Catheter size: 20 G Maximum sterile barriers used   Attempts: 1 Procedure performed without using ultrasound guided technique. Following insertion, dressing applied and Biopatch. Post procedure assessment: unchanged  Patient tolerated the procedure well with no immediate complications.

## 2018-01-27 NOTE — Transfer of Care (Signed)
Immediate Anesthesia Transfer of Care Note  Patient: Tyler Hendrix  Procedure(s) Performed: CORONARY ARTERY BYPASS GRAFTING (CABG) x two, using left internal mammary artery and right leg greater saphenous vein harvested endoscopically (N/A Chest) TRANSESOPHAGEAL ECHOCARDIOGRAM (TEE) (N/A ) MITRAL VALVE (MV) REPLACEMENT using Magna Mitral Ease Valve Size 29MM (N/A Chest)  Patient Location: SICU  Anesthesia Type:General  Level of Consciousness: sedated and Patient remains intubated per anesthesia plan  Airway & Oxygen Therapy: Patient remains intubated per anesthesia plan and Patient placed on Ventilator (see vital sign flow sheet for setting)  Post-op Assessment: Report given to RN and Post -op Vital signs reviewed and stable  Post vital signs: Reviewed and stable  Last Vitals:  Vitals Value Taken Time  BP 109/73 01/27/2018  4:45 PM  Temp 36.9 C 01/27/2018  4:47 PM  Pulse 80 01/27/2018  4:47 PM  Resp 14 01/27/2018  4:47 PM  SpO2 96 % 01/27/2018  4:47 PM  Vitals shown include unvalidated device data.  Last Pain:  Vitals:   01/27/18 0659  PainSc: 0-No pain      Patients Stated Pain Goal: 4 (90/30/09 2330)  Complications: No apparent anesthesia complications

## 2018-01-27 NOTE — Brief Op Note (Signed)
01/27/2018  2:10 PM  PATIENT:  Tyler Hendrix  62 y.o. male  PRE-OPERATIVE DIAGNOSIS:  MR CAD  POST-OPERATIVE DIAGNOSIS:  MR CAD   PROCEDURE:  Procedure(s): MITRAL VALVE REPAIR OR REPLACEMENT (MVR) (N/A) CORONARY ARTERY BYPASS GRAFTING (CABG) x two, using left internal mammary artery and right leg greater saphenous vein harvested endoscopically (N/A) TRANSESOPHAGEAL ECHOCARDIOGRAM (TEE) (N/A) MITRAL VALVE (MV) REPLACEMENT using Magna Mitral Ease Valve Size 29MM (N/A) LIMA-LAD SVG-RCA  SURGEON:  Surgeon(s) and Role:    * Rexene Alberts, MD - Primary  PHYSICIAN ASSISTANT: WAYNE GOLD PA-C  ASSISTANTS: Ara Kussmaul RNFA  ANESTHESIA:   general  EBL:  800  BLOOD ADMINISTERED:none  DRAINS: PLEURAL AND MEDIASTINAL CHEST TUBES   LOCAL MEDICATIONS USED:  NONE  SPECIMEN:  Source of Specimen:  MITRAL LEAFLETS  DISPOSITION OF SPECIMEN:  PATHOLOGY  COUNTS:  YES  TOURNIQUET:  * No tourniquets in log *  DICTATION: .Dragon Dictation  PLAN OF CARE: Admit to inpatient   PATIENT DISPOSITION:  ICU - intubated and hemodynamically stable.   Delay start of Pharmacological VTE agent (>24hrs) due to surgical blood loss or risk of bleeding: yes  COMPLICATIONS: NO KNOWN

## 2018-01-27 NOTE — Anesthesia Procedure Notes (Signed)
Central Venous Catheter Insertion Performed by: Roberts Gaudy, MD, anesthesiologist Start/End12/18/2019 7:00 AM, 01/27/2018 7:10 AM Patient location: OR. Preanesthetic checklist: patient identified, IV checked, site marked, risks and benefits discussed, surgical consent, monitors and equipment checked, pre-op evaluation, timeout performed and anesthesia consent Lidocaine 1% used for infiltration and patient sedated Hand hygiene performed  and maximum sterile barriers used  Catheter size: 9 Fr Sheath introducer Procedure performed using ultrasound guided technique. Ultrasound Notes:anatomy identified, needle tip was noted to be adjacent to the nerve/plexus identified, no ultrasound evidence of intravascular and/or intraneural injection and image(s) printed for medical record Attempts: 1 Following insertion, line sutured and dressing applied. Post procedure assessment: blood return through all ports, free fluid flow and no air  Patient tolerated the procedure well with no immediate complications.

## 2018-01-27 NOTE — Progress Notes (Addendum)
Per anesthesia, Dr Linna Caprice- pt was placed on 80% fio2 initially s/p OR.  Sat 98% now.

## 2018-01-28 ENCOUNTER — Other Ambulatory Visit: Payer: Self-pay | Admitting: Internal Medicine

## 2018-01-28 ENCOUNTER — Inpatient Hospital Stay (HOSPITAL_COMMUNITY): Payer: 59

## 2018-01-28 ENCOUNTER — Encounter (HOSPITAL_COMMUNITY): Payer: Self-pay | Admitting: Thoracic Surgery (Cardiothoracic Vascular Surgery)

## 2018-01-28 LAB — POCT I-STAT 3, ART BLOOD GAS (G3+)
Acid-base deficit: 4 mmol/L — ABNORMAL HIGH (ref 0.0–2.0)
Acid-base deficit: 6 mmol/L — ABNORMAL HIGH (ref 0.0–2.0)
BICARBONATE: 20.6 mmol/L (ref 20.0–28.0)
Bicarbonate: 19.3 mmol/L — ABNORMAL LOW (ref 20.0–28.0)
O2 Saturation: 97 %
O2 Saturation: 93 %
PH ART: 7.353 (ref 7.350–7.450)
TCO2: 22 mmol/L (ref 22–32)
TCO2: 20 mmol/L — ABNORMAL LOW (ref 22–32)
pCO2 arterial: 35.6 mmHg (ref 32.0–48.0)
pCO2 arterial: 34.9 mmHg (ref 32.0–48.0)
pH, Arterial: 7.372 (ref 7.350–7.450)
pO2, Arterial: 90 mmHg (ref 83.0–108.0)
pO2, Arterial: 72 mmHg — ABNORMAL LOW (ref 83.0–108.0)

## 2018-01-28 LAB — CBC
HCT: 32.7 % — ABNORMAL LOW (ref 39.0–52.0)
HCT: 33.4 % — ABNORMAL LOW (ref 39.0–52.0)
Hemoglobin: 11 g/dL — ABNORMAL LOW (ref 13.0–17.0)
Hemoglobin: 10.9 g/dL — ABNORMAL LOW (ref 13.0–17.0)
MCH: 31.8 pg (ref 26.0–34.0)
MCH: 31.1 pg (ref 26.0–34.0)
MCHC: 33.6 g/dL (ref 30.0–36.0)
MCHC: 32.6 g/dL (ref 30.0–36.0)
MCV: 94.5 fL (ref 80.0–100.0)
MCV: 95.4 fL (ref 80.0–100.0)
Platelets: 195 10*3/uL (ref 150–400)
Platelets: 185 10*3/uL (ref 150–400)
RBC: 3.46 MIL/uL — ABNORMAL LOW (ref 4.22–5.81)
RBC: 3.5 MIL/uL — ABNORMAL LOW (ref 4.22–5.81)
RDW: 13.3 % (ref 11.5–15.5)
RDW: 13.6 % (ref 11.5–15.5)
WBC: 15.9 10*3/uL — ABNORMAL HIGH (ref 4.0–10.5)
WBC: 19.1 10*3/uL — ABNORMAL HIGH (ref 4.0–10.5)
nRBC: 0 % (ref 0.0–0.2)
nRBC: 0 % (ref 0.0–0.2)

## 2018-01-28 LAB — POCT I-STAT, CHEM 8
BUN: 17 mg/dL (ref 8–23)
Calcium, Ion: 1.17 mmol/L (ref 1.15–1.40)
Chloride: 102 mmol/L (ref 98–111)
Creatinine, Ser: 1.2 mg/dL (ref 0.61–1.24)
Glucose, Bld: 153 mg/dL — ABNORMAL HIGH (ref 70–99)
HEMATOCRIT: 31 % — AB (ref 39.0–52.0)
Hemoglobin: 10.5 g/dL — ABNORMAL LOW (ref 13.0–17.0)
Potassium: 4 mmol/L (ref 3.5–5.1)
Sodium: 138 mmol/L (ref 135–145)
TCO2: 25 mmol/L (ref 22–32)

## 2018-01-28 LAB — BASIC METABOLIC PANEL
ANION GAP: 10 (ref 5–15)
BUN: 14 mg/dL (ref 8–23)
CALCIUM: 7.5 mg/dL — AB (ref 8.9–10.3)
CO2: 20 mmol/L — ABNORMAL LOW (ref 22–32)
Chloride: 111 mmol/L (ref 98–111)
Creatinine, Ser: 1.02 mg/dL (ref 0.61–1.24)
GFR calc Af Amer: >60 mL/min (ref >60)
GFR calc non Af Amer: >60 mL/min (ref >60)
Glucose, Bld: 162 mg/dL — ABNORMAL HIGH (ref 70–99)
Potassium: 4 mmol/L (ref 3.5–5.1)
Sodium: 141 mmol/L (ref 135–145)

## 2018-01-28 LAB — COOXEMETRY PANEL
Carboxyhemoglobin: 1.5 % (ref 0.5–1.5)
Carboxyhemoglobin: 1.5 % (ref 0.5–1.5)
Methemoglobin: 1.8 % — ABNORMAL HIGH (ref 0.0–1.5)
Methemoglobin: 1.5 % (ref 0.0–1.5)
O2 Saturation: 69.4 %
O2 Saturation: 60.6 %
Total hemoglobin: 9.9 g/dL — ABNORMAL LOW (ref 12.0–16.0)
Total hemoglobin: 11.6 g/dL — ABNORMAL LOW (ref 12.0–16.0)

## 2018-01-28 LAB — CREATININE, SERUM
Creatinine, Ser: 1.29 mg/dL — ABNORMAL HIGH (ref 0.61–1.24)
GFR calc non Af Amer: 59 mL/min — ABNORMAL LOW (ref >60)

## 2018-01-28 LAB — MAGNESIUM
Magnesium: 2.8 mg/dL — ABNORMAL HIGH (ref 1.7–2.4)
Magnesium: 2.7 mg/dL — ABNORMAL HIGH (ref 1.7–2.4)

## 2018-01-28 LAB — GLUCOSE, CAPILLARY
Glucose-Capillary: 154 mg/dL — ABNORMAL HIGH (ref 70–99)
Glucose-Capillary: 177 mg/dL — ABNORMAL HIGH (ref 70–99)
Glucose-Capillary: 143 mg/dL — ABNORMAL HIGH (ref 70–99)
Glucose-Capillary: 129 mg/dL — ABNORMAL HIGH (ref 70–99)

## 2018-01-28 MED ORDER — ATORVASTATIN CALCIUM 80 MG PO TABS
80.0000 mg | ORAL_TABLET | Freq: Every day | ORAL | Status: DC
  Administered 2018-01-30 – 2018-01-31 (×2): 80 mg via ORAL
  Filled 2018-01-28 (×2): qty 1

## 2018-01-28 MED ORDER — MONTELUKAST SODIUM 10 MG PO TABS
10.0000 mg | ORAL_TABLET | Freq: Every day | ORAL | Status: DC
  Administered 2018-01-28 – 2018-01-31 (×4): 10 mg via ORAL
  Filled 2018-01-28 (×4): qty 1

## 2018-01-28 MED ORDER — ASPIRIN EC 325 MG PO TBEC
325.0000 mg | DELAYED_RELEASE_TABLET | Freq: Every day | ORAL | Status: AC
  Administered 2018-01-28: 325 mg via ORAL
  Filled 2018-01-28: qty 1

## 2018-01-28 MED ORDER — WARFARIN - PHYSICIAN DOSING INPATIENT
Freq: Every day | Status: DC
  Administered 2018-01-28: 18:00:00

## 2018-01-28 MED ORDER — INSULIN ASPART 100 UNIT/ML ~~LOC~~ SOLN: 0.0000 [IU] | SUBCUTANEOUS | Status: DC

## 2018-01-28 MED ORDER — ASPIRIN EC 81 MG PO TBEC
81.0000 mg | DELAYED_RELEASE_TABLET | Freq: Every day | ORAL | Status: DC
  Administered 2018-01-29 – 2018-02-01 (×4): 81 mg via ORAL
  Filled 2018-01-28 (×4): qty 1

## 2018-01-28 MED ORDER — CETIRIZINE HCL 10 MG PO TABS
10.0000 mg | ORAL_TABLET | Freq: Every day | ORAL | Status: DC
  Administered 2018-01-28 – 2018-01-31 (×4): 10 mg via ORAL
  Filled 2018-01-28 (×6): qty 1

## 2018-01-28 MED ORDER — ORAL CARE MOUTH RINSE
15.0000 mL | Freq: Two times a day (BID) | OROMUCOSAL | Status: DC
  Administered 2018-01-28 – 2018-01-30 (×5): 15 mL via OROMUCOSAL

## 2018-01-28 MED ORDER — TRAZODONE HCL 50 MG PO TABS
25.0000 mg | ORAL_TABLET | Freq: Every day | ORAL | Status: DC
  Administered 2018-01-28: 25 mg via ORAL
  Filled 2018-01-28: qty 1

## 2018-01-28 MED ORDER — INSULIN ASPART 100 UNIT/ML ~~LOC~~ SOLN
0.0000 [IU] | SUBCUTANEOUS | Status: DC
  Administered 2018-01-28: 2 [IU] via SUBCUTANEOUS
  Administered 2018-01-28: 4 [IU] via SUBCUTANEOUS
  Administered 2018-01-28 (×2): 2 [IU] via SUBCUTANEOUS

## 2018-01-28 MED ORDER — KETOROLAC TROMETHAMINE 15 MG/ML IJ SOLN
15.0000 mg | Freq: Four times a day (QID) | INTRAMUSCULAR | Status: AC
  Administered 2018-01-28 – 2018-01-29 (×5): 15 mg via INTRAVENOUS
  Filled 2018-01-28 (×4): qty 1

## 2018-01-28 MED ORDER — WARFARIN SODIUM 2.5 MG PO TABS
2.5000 mg | ORAL_TABLET | Freq: Every day | ORAL | Status: DC
  Administered 2018-01-28 – 2018-01-30 (×3): 2.5 mg via ORAL
  Filled 2018-01-28 (×4): qty 1

## 2018-01-28 MED ORDER — HYDROCODONE-ACETAMINOPHEN 5-325 MG PO TABS: 1.0000 | ORAL_TABLET | ORAL | Status: DC | PRN

## 2018-01-28 MED ORDER — OXYCODONE-ACETAMINOPHEN 5-325 MG PO TABS
1.0000 | ORAL_TABLET | ORAL | Status: DC | PRN
  Administered 2018-01-28 (×2): 1 via ORAL
  Administered 2018-01-28 (×2): 2 via ORAL
  Administered 2018-01-29 – 2018-01-30 (×4): 1 via ORAL
  Administered 2018-01-30: 2 via ORAL
  Administered 2018-01-31: 1 via ORAL
  Administered 2018-01-31 – 2018-02-01 (×4): 2 via ORAL
  Filled 2018-01-28 (×3): qty 2
  Filled 2018-01-28: qty 1
  Filled 2018-01-28: qty 2
  Filled 2018-01-28: qty 1
  Filled 2018-01-28: qty 2
  Filled 2018-01-28 (×2): qty 1
  Filled 2018-01-28: qty 2
  Filled 2018-01-28 (×2): qty 1
  Filled 2018-01-28 (×2): qty 2

## 2018-01-28 MED ORDER — ENOXAPARIN SODIUM 40 MG/0.4ML ~~LOC~~ SOLN
40.0000 mg | Freq: Every day | SUBCUTANEOUS | Status: DC
  Administered 2018-01-29 – 2018-01-31 (×3): 40 mg via SUBCUTANEOUS
  Filled 2018-01-28 (×3): qty 0.4

## 2018-01-28 MED ORDER — FUROSEMIDE 10 MG/ML IJ SOLN
20.0000 mg | Freq: Four times a day (QID) | INTRAMUSCULAR | Status: AC
  Administered 2018-01-28 (×3): 20 mg via INTRAVENOUS
  Filled 2018-01-28 (×3): qty 2

## 2018-01-28 MED FILL — Potassium Chloride Inj 2 mEq/ML: INTRAVENOUS | Qty: 40 | Status: AC

## 2018-01-28 MED FILL — Heparin Sodium (Porcine) Inj 1000 Unit/ML: INTRAMUSCULAR | Qty: 30 | Status: AC

## 2018-01-28 MED FILL — Magnesium Sulfate Inj 50%: INTRAMUSCULAR | Qty: 10 | Status: AC

## 2018-01-28 NOTE — Discharge Instructions (Addendum)
Information on my medicine - Coumadin   (Warfarin)  Why was Coumadin prescribed for you? Coumadin was prescribed for you because you have a blood clot or a medical condition that can cause an increased risk of forming blood clots. Blood clots can cause serious health problems by blocking the flow of blood to the heart, lung, or brain. Coumadin can prevent harmful blood clots from forming. As a reminder your indication for Coumadin is:   Blood Clot Prevention After Heart Valve Surgery Coronary Artery Bypass Grafting, Care After This sheet gives you information about how to care for yourself after your procedure. Your doctor may also give you more specific instructions. If you have problems or questions, contact your doctor. Follow these instructions at home: Medicines  Take over-the-counter and prescription medicines only as told by your doctor. Do not stop taking medicines or start any new medicines unless your doctor says it is okay.  If you were prescribed an antibiotic medicine, take it as told by your doctor. Do not stop taking the antibiotic even if you start to feel better.  Do not drive or use heavy machinery while taking prescription pain medicine. Incision care      Follow instructions from your doctor about how to take care of your incisions. Make sure you: ? Wash your hands with soap and water before you change your bandage (dressing). If you cannot use soap and water, use hand sanitizer. ? Change your dressing as told by your doctor. ? Leave stitches (sutures), skin glue, or skin tape (adhesive) strips in place. They may need to stay in place for 2 weeks or longer. If tape strips get loose and curl up, you may trim the loose edges. Do not remove tape strips completely unless your doctor says it is okay.  Make sure the incisions are clean, dry, and protected.  Check your incision areas every day for signs of infection. Check for: ? Redness, swelling, or pain. ? Fluid or  blood. ? Warmth. ? Pus or a bad smell.  If cuts were made in your legs: ? Avoid crossing your legs. ? Avoid sitting for long periods of time. Change positions every 30 minutes. ? Raise (elevate) your legs when you are sitting. Bathing  Do not take baths, swim, or use a hot tub until your doctor says it is okay.  Only take sponge baths. Pat the incisions dry. Do not rub the incisions to dry.  Ask your doctor when you can shower. Eating and drinking  Eat foods that are high in fiber, such as raw fruits and vegetables, whole grains, beans, and nuts. Meats should be lean cut. Avoid canned, processed, and fried foods. This can help prevent constipation. This is also a recommended part of a heart-healthy diet.  Drink enough fluid to keep your urine clear or pale yellow.  Limit alcohol intake to no more than 1 drink a day for nonpregnant women and 2 drinks a day for men. One drink equals 12 oz of beer, 5 oz of wine, or 1 oz of hard liquor. Activity  Rest and limit your activity as told by your doctor. You may be told to: ? Stop any activity right away if you have chest pain, shortness of breath, irregular heartbeats, or dizziness. Get help right away if you have any of these symptoms. ? Move around often for short periods or take short walks as told by your doctor. Slowly increase your activities. You may need physical therapy or cardiac rehabilitation. ?  Avoid lifting, pushing, or pulling anything that is heavier than 10 lb (4.5 kg) for at least 6 weeks or as told by your doctor.  Do not drive until your doctor says it is okay.  Ask your doctor when you can go back to work.  Ask your doctor when you can be sexually active. General instructions   Do not use any products that contain nicotine or tobacco, such as cigarettes and e-cigarettes. If you need help quitting, ask your doctor.  Take 2-3 deep breaths every few hours during the day while you get better. This helps expand your  lungs and prevent complications.  If you were given a device called an incentive spirometer, use it several times a day to practice deep breathing. Support your chest with a pillow or your arms when you take deep breaths or cough.  Wear compression stockings as told by your doctor. These stockings help to prevent blood clots and reduce swelling in your legs.  Weigh yourself every day. This helps to see if your body is holding (retaining) fluid that may make your heart and lungs work harder.  Keep all follow-up visits as told by your doctor. This is important. Contact a doctor if:  You have more redness, swelling, or pain around any cut.  You have more fluid or blood coming from any cut.  Any cut feels warm to the touch.  You have pus or a bad smell coming from any cut.  You have a fever.  You have swelling in your ankles or legs.  You have pain in your legs.  You gain 2 or more pounds (0.9 kg) a day.  You feel sick to your stomach (nauseous) or throw up (vomit).  You have watery poop (diarrhea). Get help right away if:  You have chest pain that goes to your jaw or arms.  You are short of breath.  You have a fast or irregular heartbeat.  You notice a "clicking" in your breastbone (sternum) when you move.  You feel numb or weak in your arms or legs.  You feel dizzy or light-headed. Summary  After the procedure, it is common to have pain or discomfort in the incision areas.  Do not take baths, swim, or use a hot tub until your health care provider approves.  Slowly increase your activities. You may need physical therapy or cardiac rehabilitation.  Weigh yourself every day. This helps to see if your body is holding (retaining) fluid that may make your heart and lungs work harder. This information is not intended to replace advice given to you by your health care provider. Make sure you discuss any questions you have with your health care provider. Document Released:  02/01/2013 Document Revised: 03/11/2016 Document Reviewed: 03/11/2016 Elsevier Interactive Patient Education  2019 [employer]  Mitral Valve Replacement, Care After This sheet gives you information about how to care for yourself after your procedure. Your health care provider may also give you more specific instructions. If you have problems or questions, contact your health care provider. What can I expect after the procedure? After the procedure, it is common to have:  Pain at the incision area that may last for several weeks. Follow these instructions at home: Incision care   Follow instructions from your health care provider about how to take care of your incision. Make sure you: ? Wash your hands with soap and water before you change your bandage (dressing). If soap and water are not available, use hand sanitizer. ?  Change your dressing as told by your health care provider. ? Leave stitches (sutures), skin glue, or adhesive strips in place. These skin closures may need to stay in place for 2 weeks or longer. If adhesive strip edges start to loosen and curl up, you may trim the loose edges. Do not remove adhesive strips completely unless your health care provider tells you to do that.  Check your incision area every day for signs of infection. Check for: ? More redness, swelling, or pain. ? More fluid or blood. ? Warmth. ? Pus or a bad smell.  Do not apply powder or lotion to the area. Driving  Do not drive until your health care provider approves.  Do not drive or use heavy machinery while taking prescription pain medicines. Bathing  Do not take baths, swim, or use a hot tub for 2-4 weeks after surgery, or until your health care provider approves. Ask your health care provider if you may take showers.  To wash the incision site, gently wash with soap and water and pat the area dry with a clean towel. Do not rub the incision area. That may cause bleeding. Activity  Rest as  told by your health care provider. Ask your health care provider when you can resume normal activities, including sexual activity.  Avoid the following activities for 6-8 weeks, or as long as directed: ? Lifting anything that is heavier than 10 lb (4.5 kg), or the limit that your health care provider tells you. ? Pushing or pulling things with your arms.  Avoid climbing stairs and using the handrail to pull yourself up for the first 2-3 weeks after surgery.  Avoid airplane travel for 4-6 weeks, or as long as directed.  Avoid sitting for long periods of time and crossing your legs. Get up and move around at least once every 1-2 hours.  If you are taking blood thinners (anticoagulants), avoid activities that have a high risk of injury. Ask your health care provider what activities are safe for you. Lifestyle  Limit alcohol intake to no more than 1 drink a day for nonpregnant women and 2 drinks a day for men. One drink equals 12 oz of beer, 5 oz of wine, or 1 oz of hard liquor.  Do not use any products that contain nicotine or tobacco, such as cigarettes and e-cigarettes. If you need help quitting, ask your health care provider. General instructions   Take your temperature every day and weigh yourself every morning for the first 7 days after surgery. Write your temperatures and weight down and take this record with you to any follow-up visits.  Take over-the-counter and prescription medicines only as told by your health care provider.  To prevent or treat constipation while you are taking prescription pain medicine, your health care provider may recommend that you: ? Drink enough fluid to keep your urine clear or pale yellow. ? Take over-the-counter or prescription medicines. ? Eat foods that are high in fiber, such as fresh fruits and vegetables, whole grains, and beans. ? Limit foods that are high in fat and processed sugars, such as fried and sweet foods.  Follow instructions from  your health care provider about eating or drinking restrictions.  Wear compression stockings for at least 2 weeks, or as long as told by your health care provider. These stockings help to prevent blood clots and reduce swelling in your legs. If your ankles are swollen after 2 weeks, continue to wear the stockings.  Keep all  follow-up visits as told by your health care provider. This is important. Contact a health care provider if:  You develop a skin rash.  Your weight is increasing each day over 2-3 days.  You gain 2 lb (1 kg) or more in a single day.  You have a fever. Get help right away if:  You develop chest pain that feels different from the pain caused by your incision.  You develop shortness of breath or difficulty breathing.  You have more redness, swelling, or pain around your incision.  You have more fluid or blood coming from your incision.  Your incision feels warm to the touch.  You have pus or a bad smell coming from your incision.  You feel light-headed. This information is not intended to replace advice given to you by your health care provider. Make sure you discuss any questions you have with your health care provider. Document Released: 08/16/2004 Document Revised: 11/09/2015 Document Reviewed: 11/09/2015 Elsevier Interactive Patient Education  2019 [employer].   What test will check on my response to Coumadin? While on Coumadin (warfarin) you will need to have an INR test regularly to ensure that your dose is keeping you in the desired range. The INR (international normalized ratio) number is calculated from the result of the laboratory test called prothrombin time (PT).  If an INR APPOINTMENT HAS NOT ALREADY BEEN MADE FOR YOU please schedule an appointment to have this lab work done by your health care provider within 7 days. Your INR goal is usually a number between:  2 to 3 or your provider may give you a more narrow range like 2-2.5.  Ask your health  care provider during an office visit what your goal INR is.  What  do you need to  know  About  COUMADIN? Take Coumadin (warfarin) exactly as prescribed by your healthcare provider about the same time each day.  DO NOT stop taking without talking to the doctor who prescribed the medication.  Stopping without other blood clot prevention medication to take the place of Coumadin may increase your risk of developing a new clot or stroke.  Get refills before you run out.  What do you do if you miss a dose? If you miss a dose, take it as soon as you remember on the same day then continue your regularly scheduled regimen the next day.  Do not take two doses of Coumadin at the same time.  Important Safety Information A possible side effect of Coumadin (Warfarin) is an increased risk of bleeding. You should call your healthcare provider right away if you experience any of the following: ? Bleeding from an injury or your nose that does not stop. ? Unusual colored urine (red or dark brown) or unusual colored stools (red or black). ? Unusual bruising for unknown reasons. ? A serious fall or if you hit your head (even if there is no bleeding).  Some foods or medicines interact with Coumadin (warfarin) and might alter your response to warfarin. To help avoid this: ? Eat a balanced diet, maintaining a consistent amount of Vitamin K. ? Notify your provider about major diet changes you plan to make. ? Avoid alcohol or limit your intake to 1 drink for women and 2 drinks for men per day. (1 drink is 5 oz. wine, 12 oz. beer, or 1.5 oz. liquor.)  Make sure that ANY health care provider who prescribes medication for you knows that you are taking Coumadin (warfarin).  Also make  sure the healthcare provider who is monitoring your Coumadin knows when you have started a new medication including herbals and non-prescription products.  Coumadin (Warfarin)  Major Drug Interactions  Increased Warfarin Effect Decreased  Warfarin Effect  Alcohol (large quantities) Antibiotics (esp. Septra/Bactrim, Flagyl, Cipro) Amiodarone (Cordarone) Aspirin (ASA) Cimetidine (Tagamet) Megestrol (Megace) NSAIDs (ibuprofen, naproxen, etc.) Piroxicam (Feldene) Propafenone (Rythmol SR) Propranolol (Inderal) Isoniazid (INH) Posaconazole (Noxafil) Barbiturates (Phenobarbital) Carbamazepine (Tegretol) Chlordiazepoxide (Librium) Cholestyramine (Questran) Griseofulvin Oral Contraceptives Rifampin Sucralfate (Carafate) Vitamin K   Coumadin (Warfarin) Major Herbal Interactions  Increased Warfarin Effect Decreased Warfarin Effect  Garlic Ginseng Ginkgo biloba Coenzyme Q10 Green tea St. Johns wort    Coumadin (Warfarin) FOOD Interactions  Eat a consistent number of servings per week of foods HIGH in Vitamin K (1 serving =  cup)  Collards (cooked, or boiled & drained) Kale (cooked, or boiled & drained) Mustard greens (cooked, or boiled & drained) Parsley *serving size only =  cup Spinach (cooked, or boiled & drained) Swiss chard (cooked, or boiled & drained) Turnip greens (cooked, or boiled & drained)  Eat a consistent number of servings per week of foods MEDIUM-HIGH in Vitamin K (1 serving = 1 cup)  Asparagus (cooked, or boiled & drained) Broccoli (cooked, boiled & drained, or raw & chopped) Brussel sprouts (cooked, or boiled & drained) *serving size only =  cup Lettuce, raw (green leaf, endive, romaine) Spinach, raw Turnip greens, raw & chopped   These websites have more information on Coumadin (warfarin):  FailFactory.se; VeganReport.com.au;

## 2018-01-28 NOTE — Progress Notes (Addendum)
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, using left internal mammary artery and right leg greater saphenous vein harvested endoscopically (N/A) TRANSESOPHAGEAL ECHOCARDIOGRAM (TEE) (N/A) MITRAL VALVE (MV) REPLACEMENT using Magna Mitral Ease Valve Size 29MM (N/A)  Total Length of Stay:  LOS: 1 day   Subjective: Feels pretty well, mod pain sternal incis and from chest tubes  Objective: Vital signs in last 24 hours: Temp:  [98.4 F (36.9 C)-99.9 F (37.7 C)] 99.5 F (37.5 C) (12/19 0700) Pulse Rate:  [80-90] 89 (12/19 0700) Cardiac Rhythm: A-V Sequential paced (12/19 0400) Resp:  [10-31] 30 (12/19 0700) BP: (95-154)/(59-101) 127/76 (12/19 0700) SpO2:  [96 %-100 %] 100 % (12/19 0700) Arterial Line BP: (103-177)/(58-88) 151/66 (12/19 0700) FiO2 (%):  [40 %-80 %] 40 % (12/19 0138) Weight:  [89 kg] 89 kg (12/19 0500)  Filed Weights   01/27/18 0648 01/28/18 0500  Weight: 85.3 kg 89 kg    Weight change: 3.719 kg   Hemodynamic parameters for last 24 hours: PAP: (20-48)/(11-27) 41/16 CVP:  [1 mmHg] 1 mmHg CO:  [2.8 L/min-6.6 L/min] 6.3 L/min CI:  [1.4 L/min/m2-3.2 L/min/m2] 3.1 L/min/m2  Intake/Output from previous day: 12/18 0701 - 12/19 0700 In: 7598 [I.V.:4608.8; Blood:500; IV Piggyback:2489.2] Out: 2505 [Urine:2430; Blood:800; Chest Tube:625]  Intake/Output this shift: No intake/output data recorded.  Current Meds: Scheduled Meds: . acetaminophen  1,000 mg Oral Q6H  . aspirin EC  325 mg Oral Daily  . [START ON 01/29/2018] aspirin EC  81 mg Oral Daily  . [START ON 01/30/2018] atorvastatin  80 mg Oral q1800  . bisacodyl  10 mg Oral Daily   Or  . bisacodyl  10 mg Rectal Daily  . cetirizine  10 mg Oral QHS  . Chlorhexidine Gluconate Cloth  6 each Topical Daily  . docusate sodium  200 mg Oral Daily   . [START ON 01/29/2018] enoxaparin (LOVENOX) injection  40 mg Subcutaneous QHS  . furosemide  20 mg Intravenous Q6H  . insulin aspart  0-24 Units Subcutaneous Q4H  . levothyroxine  75 mcg Oral QAC breakfast  . mouth rinse  15 mL Mouth Rinse BID  . montelukast  10 mg Oral QHS  . [START ON 01/29/2018] pantoprazole  40 mg Oral Daily  . sodium chloride flush  10-40 mL Intracatheter Q12H  . sodium chloride flush  3 mL Intravenous Q12H  . traZODone  25 mg Oral QHS  . warfarin  2.5 mg Oral q1800  . Warfarin - Physician Dosing Inpatient   Does not apply q1800   Continuous Infusions: . sodium chloride    . cefUROXime (ZINACEF)  IV Stopped (01/27/18 2107)  . lactated ringers Stopped (01/27/18 1645)  . lactated ringers Stopped (01/27/18 1645)  . milrinone 0.2 mcg/kg/min (01/28/18 0700)  . nitroGLYCERIN Stopped (01/28/18 3976)   PRN Meds:.metoprolol tartrate, morphine injection, ondansetron (ZOFRAN) IV, oxyCODONE, sodium chloride flush, sodium chloride flush, traMADol  General appearance: alert, cooperative and no distress Heart: regular rate and rhythm and + rub with chest tubes in place Lungs: mildly dimin in bases Abdomen: soft, nontender or distended Extremities: no sign edema Wound: dressings intact  Lab Results: CBC: Recent Labs  01/27/18 2218 01/27/18 2225 01/28/18 0409  WBC 13.3*  --  15.9*  HGB 10.9* 9.9* 11.0*  HCT 32.0* 29.0* 32.7*  PLT 153  --  195   BMET:  Recent Labs    01/25/18 1432  01/27/18 2225 01/28/18 0409  NA 140   < > 141 141  K 3.6   < > 4.1 4.0  CL 109   < > 111 111  CO2 22  --   --  20*  GLUCOSE 121*   < > 121* 162*  BUN 15   < > 14 14  CREATININE 1.14   < > 0.90 1.02  CALCIUM 9.1  --   --  7.5*   < > = values in this interval not displayed.    CMET: Lab Results  Component Value Date   WBC 15.9 (H) 01/28/2018   HGB 11.0 (L) 01/28/2018   HCT 32.7 (L) 01/28/2018   PLT 195 01/28/2018   GLUCOSE 162 (H) 01/28/2018   CHOL 93 05/12/2017    TRIG 83.0 05/12/2017   HDL 35.20 (L) 05/12/2017   LDLDIRECT 88.6 10/17/2011   LDLCALC 42 05/12/2017   ALT 34 01/25/2018   AST 28 01/25/2018   NA 141 01/28/2018   K 4.0 01/28/2018   CL 111 01/28/2018   CREATININE 1.02 01/28/2018   BUN 14 01/28/2018   CO2 20 (L) 01/28/2018   TSH 2.50 05/12/2017   PSA 2.27 05/12/2017   INR 1.58 01/27/2018   HGBA1C 4.7 (L) 01/25/2018      PT/INR:  Recent Labs    01/27/18 1645  LABPROT 18.6*  INR 1.58   Radiology: Dg Chest Port 1 View  Result Date: 01/27/2018 CLINICAL DATA:  Post CABG.  Check support apparatuses.  Atelectasis. EXAM: PORTABLE CHEST 1 VIEW COMPARISON:  01/25/2017 FINDINGS: Endotracheal tube appears to be just above the clavicles and at least 6.3 cm above the carina. Again noted is a large cardiac silhouette. Patient has a mitral valve replacement. Mediastinal and chest tubes. Negative for pneumothorax. Nasogastric tube extends into the upper abdomen. There is a right jugular central line with pulmonary catheter probably in the proximal right pulmonary artery region. The left hemidiaphragm is obscured compatible with atelectasis and volume loss at left lung base. Vascular crowding in the left hilum. Small linear densities in the periphery of the right lung may represent interstitial edema. Densities in the right hilum probably related to postsurgical changes and volume loss. 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 IMPRESSION: Support apparatuses as described. Endotracheal tube may be slightly high measuring at least 6 cm above the carina. Chest tubes without a pneumothorax. Evidence for vascular congestion and mild interstitial edema. Consolidation or volume loss at the left lung base. Electronically Signed   By: Markus Daft M.D.   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 PA pressures mos elevated. CoOx - 69.4 . UOP is good. Weight 6 KG above preop- lasix ordered, monitor over time 2 sats good on 6 liter, wean as able, routine pulm toilt 3 AV paced at 90, transition to PPM at 70 4 begin coumadin 5 renal fxn normal 6 leukocytosis c/w systemic inflammation- follow clinically 7 mild ABL  anemia- monitor 8 CXR some vasc congestion and atx- diurese and pulm toilet 9 mod CT drainage- 390 so far today - keep CT for now 10 add toradol 11 see orders     John Giovanni PA-C 01/28/2018 7:22 AM  Pager [phone]   I have seen and examined the patient and agree with the assessment and plan as outlined.  Doing very well POD1.  Wean milrinone slowly.  Mobilize.  D/C lines.  D/C tubes later today or tomorrow, depending on output  Rexene Alberts, MD 01/28/2018 8:42 AM

## 2018-01-28 NOTE — Progress Notes (Signed)
Anesthesiology Follow up:  Awake and alert, in good spirits, neuro intact, sitting in chair. Having mild incisional pain. 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  Doing well so far. No apparent anesthetic complications.  Roberts Gaudy, MD

## 2018-01-28 NOTE — Progress Notes (Signed)
Communicated with RT, ready to wean. Pt awake, calm, following commands, able to lift head off bed for > 5 seconds. O2 sats stable on 50% FiO2 for 1 hour.

## 2018-01-28 NOTE — Progress Notes (Signed)
Pt tolerated ice chips and water very well. No coughing noted while swallowing. Tolerated PO meds well.

## 2018-01-28 NOTE — Progress Notes (Signed)
      [address]       [place] [address]             [phone]      POD # 1 CABG, mitral repair  Resting comfortably  BP 107/62   Pulse 70   Temp 98.1 F (36.7 C) (Oral)   Resp (!) 27   Ht 5\' 11"  (1.803 m)   Wt 89 kg   SpO2 97%   BMI 27.36 kg/m   Intake/Output Summary (Last 24 hours) at 01/28/2018 1738 Last data filed at 01/28/2018 1615 Gross per 24 hour  Intake 4321.7 ml  Output 2635 ml  Net 1686.7 ml   Co=ox 60 Creatinine 1.2  Doing well POD # 1  Steven C. Roxan Hockey, MD [facility] [phone]

## 2018-01-28 NOTE — Procedures (Signed)
Extubation Procedure Note  Patient Details:   Name: Tyler Hendrix DOB: 13-Sep-1955 MRN: [id number]   Airway Documentation:    Vent end date: (not recorded) Vent end time: (not recorded)   Evaluation  O2 sats: stable throughout Complications: No apparent complications Patient did tolerate procedure well. Bilateral Breath Sounds: Clear, Diminished   Patient extubated to 4lpm [place]. Cuff leak noted prior to extubation. VC 1.2L  NIF -30. Patient able to vocalize post extubation. Good effort on IS reaching >770ml.  Catha Brow 01/28/2018, 2:45 AM

## 2018-01-29 ENCOUNTER — Inpatient Hospital Stay (HOSPITAL_COMMUNITY): Payer: 59

## 2018-01-29 LAB — CBC
HCT: 33.5 % — ABNORMAL LOW (ref 39.0–52.0)
Hemoglobin: 10.9 g/dL — ABNORMAL LOW (ref 13.0–17.0)
MCH: 31.2 pg (ref 26.0–34.0)
MCHC: 32.5 g/dL (ref 30.0–36.0)
MCV: 96 fL (ref 80.0–100.0)
NRBC: 0 % (ref 0.0–0.2)
Platelets: 158 10*3/uL (ref 150–400)
RBC: 3.49 MIL/uL — ABNORMAL LOW (ref 4.22–5.81)
RDW: 14 % (ref 11.5–15.5)
WBC: 16.4 10*3/uL — ABNORMAL HIGH (ref 4.0–10.5)

## 2018-01-29 LAB — BASIC METABOLIC PANEL
Anion gap: 9 (ref 5–15)
BUN: 21 mg/dL (ref 8–23)
CALCIUM: 8.3 mg/dL — AB (ref 8.9–10.3)
CO2: 24 mmol/L (ref 22–32)
Chloride: 105 mmol/L (ref 98–111)
Creatinine, Ser: 1.31 mg/dL — ABNORMAL HIGH (ref 0.61–1.24)
GFR calc non Af Amer: 58 mL/min — ABNORMAL LOW (ref >60)
Glucose, Bld: 135 mg/dL — ABNORMAL HIGH (ref 70–99)
Potassium: 4.1 mmol/L (ref 3.5–5.1)
SODIUM: 138 mmol/L (ref 135–145)

## 2018-01-29 LAB — GLUCOSE, CAPILLARY
Glucose-Capillary: 115 mg/dL — ABNORMAL HIGH (ref 70–99)
Glucose-Capillary: 126 mg/dL — ABNORMAL HIGH (ref 70–99)

## 2018-01-29 LAB — COOXEMETRY PANEL
Carboxyhemoglobin: 1.8 % — ABNORMAL HIGH (ref 0.5–1.5)
Methemoglobin: 1.7 % — ABNORMAL HIGH (ref 0.0–1.5)
O2 Saturation: 59.7 %
Total hemoglobin: 11.5 g/dL — ABNORMAL LOW (ref 12.0–16.0)

## 2018-01-29 LAB — PROTIME-INR
INR: 1.24
Prothrombin Time: 15.5 seconds — ABNORMAL HIGH (ref 11.4–15.2)

## 2018-01-29 MED ORDER — POTASSIUM CHLORIDE CRYS ER 20 MEQ PO TBCR
20.0000 meq | EXTENDED_RELEASE_TABLET | Freq: Two times a day (BID) | ORAL | Status: AC
  Administered 2018-01-30 – 2018-01-31 (×4): 20 meq via ORAL
  Filled 2018-01-29 (×4): qty 1

## 2018-01-29 MED ORDER — TRAZODONE HCL 50 MG PO TABS
50.0000 mg | ORAL_TABLET | Freq: Every day | ORAL | Status: DC
  Administered 2018-01-29 – 2018-01-31 (×3): 50 mg via ORAL
  Filled 2018-01-29 (×3): qty 1

## 2018-01-29 MED ORDER — MOVING RIGHT ALONG BOOK
Freq: Once | Status: DC
  Filled 2018-01-29: qty 1

## 2018-01-29 MED ORDER — FUROSEMIDE 40 MG PO TABS
40.0000 mg | ORAL_TABLET | Freq: Two times a day (BID) | ORAL | Status: AC
  Administered 2018-01-30 (×2): 40 mg via ORAL
  Filled 2018-01-29 (×2): qty 1

## 2018-01-29 MED ORDER — CARVEDILOL 3.125 MG PO TABS
3.1250 mg | ORAL_TABLET | Freq: Two times a day (BID) | ORAL | Status: DC
  Administered 2018-01-29 – 2018-02-01 (×7): 3.125 mg via ORAL
  Filled 2018-01-29 (×7): qty 1

## 2018-01-29 MED ORDER — TRAZODONE HCL 50 MG PO TABS: 50.0000 mg | ORAL_TABLET | Freq: Every day | ORAL | Status: DC

## 2018-01-29 MED ORDER — FUROSEMIDE 10 MG/ML IJ SOLN
20.0000 mg | Freq: Four times a day (QID) | INTRAMUSCULAR | Status: AC
  Administered 2018-01-29 (×3): 20 mg via INTRAVENOUS
  Filled 2018-01-29 (×3): qty 2

## 2018-01-29 NOTE — Progress Notes (Signed)
Patient arrived to 4E room 27 at this time. Telemetry applied and CCMD notified. V/s and assessment done. CHG bath complete. Patient oriented to room and how to call nurse with any needs.  Emelda Fear, RN

## 2018-01-29 NOTE — Discharge Summary (Signed)
Physician Discharge Summary  Patient ID: Tyler Hendrix MRN: [id number] DOB/AGE: 61-Jul-1957 61 y.o.  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 S/P mitral valve replacement with bioprosthetic valve + CABG x2 01/27/2018  . S/P CABG x 2 01/27/2018  . Incidental pulmonary nodule 01/19/2018  . Severe mitral regurgitation   . Acute systolic heart failure (HCC)   . Status post coronary artery stent placement 10/17/2016  . Hyperlipidemia 04/17/2016  . Hypothyroidism 01/01/2016  . Cardiac pacemaker 01/01/2016  . Exertional dyspnea 11/21/2015  . Coronary artery disease involving native coronary artery of native heart without angina pectoris   . Complete heart block (HCC) 10/04/2015  . PVCs (premature ventricular contractions) 10/04/2015  . Essential hypertension 06/22/2015  . Junctional bradycardia 11/29/2014  . History of radiation therapy 06/15/2014  . RBBB 10/26/2013  . OTHER ACUTE SINUSITIS 02/09/2007  . History of Hodgkin's disease 11/04/2006   HPI: At time of evaluation.  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  Patient's cardiac  history dates back to 2015when he initially presented with palpitations. He was followed intermittently by Dr. 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 He developed complete heart block associated with syncopal episode and underwent permanent pacemaker implantation in 2017 by Dr. Johney Frame. Catheterization performed at that time revealed multivessel coronary artery disease for which he underwent PCI and stenting of the mid left circumflex coronary artery and the first diagonal branch of the left anterior descending coronary artery. He has been followed regularly ever since by Dr. Rennis Golden and Dr. Johney Frame. Over the past year the patient has developed intermittent symptoms of exertional shortness of breath. Symptoms have been somewhat sporadic and typically associated with more strenuous physical exertion. 2 episodes occurred following sex and more notably quite severe, such that the patient states that it took more than 15 minutes for him to recover. He has not had associated chest pain or chest tightness. On 2 occasions he had a small amount of hemoptysis. He was seen in follow-up recently by Dr. Johney Frame and an echocardiogram obtained November 25, 2017. Left ventricular systolic function was moderately reduced with ejection fraction estimated only 40%. There was moderate to severe mitral regurgitation and mild pulmonary hypertension. The patient subsequently underwent left and right heart catheterization on December 02, 2017. There was felt to be mild left ventricular systolic dysfunction with ejection fraction estimated 45 to 50%. There was felt to be mild to moderate mitral regurgitation with mild pulmonary hypertension. There was multivessel coronary artery disease with 80% stenosis of the mid left anterior descending coronary artery. There was long segment 50% stenosis of the mid right coronary artery. The stents in the left circumflex and the diagonal branch were both  widely  patent and free of disease. TEE was performed December 09, 2017 and confirmed the presence of severe mitral regurgitation. There was moderate left ventricular chamber enlargement with mild to moderate left ventricular systolic dysfunction. Ejection fraction was estimated 40 to 45%. There was hypokinesis of the anterior myocardium and apical septum. Quantification of the severity of mitral regurgitation using PISArevealed anEROof 0.67 cm corresponding to regurgitant volume calculated 113 mL. Cardiothoracic surgical consultation was requested.  The patient is married and lives with his wife locally in [place]. He works full-time as an Pensions consultant. He has 3 children. Up until recently he has enjoyed exercising on a regular basis. Approximately 1 to 2 years ago he began to experience intermittent exertional shortness of breath with decreased exercise tolerance. Symptoms seem to be sporadic as sometimes he feels better than others. Symptoms have not progressed to any significant degree, although the patient has cut back his physical activity considerably. He denies any exertional chest pain or chest tightness. He denies any history of resting shortness of breath, PND, orthopnea, or lower extremity edema. He has not had any dizzy spells or syncope since pacemaker implantation.  Patient was evaluated by Dr. Cornelius Moras as were his studies and he recommended proceeding with surgical intervention of coronary bypass grafting and mitral valve repair versus replacement.   Discharged Condition: good  Hospital Course: Patient was admitted electively and on 01/27/2018 taken the operating room where he underwent the below described procedure.  He tolerated it well and was taken to the surgical intensive care unit in stable condition.  Postoperative hospital course:  Patient is done quite well.  He was weaned from the ventilator using standard protocols without difficulty.  He initially required  external pacing but has gradually transition to his permanent pacemaker placed previously.  He has maintained stable hemodynamics.  He did require some milrinone but this was weaned without difficulty over the first 2 days of hospitalization.  All routine lines, monitors and drainage devices have been discontinued in the standard fashion.  He has a mild expected acute blood loss anemia which is stable.  He has a moderate leukocytosis which is trending improved, and has been monitored clinically with no evidence of acute infection.  He has been started on Coumadin and has been adjusted based on daily INRs.  Blood sugars have been under good control using standard measures.  He is not a diabetic.  Theron Arista has been weaned and he maintains good saturations on room air.  He is tolerating gradually increasing activities using standard cardiac rehab protocols.  At the time of discharge the patient is felt to be quite stable.  His Coumadin dose at discharge will be 5 mg.   Consults: None  Significant Diagnostic Studies: Routine postoperative labs and serial chest x-rays.  Treatments: surgery:  CARDIOTHORACIC SURGERY OPERATIVE NOTE  Date of Procedure:                01/27/2018  Preoperative Diagnosis:        Severe Mitral Regurgitation  Severe Multivessel Coronary Artery Disease  Postoperative Diagnosis:    Same  Procedure:       Mitral Valve Replacement              Edwards Magna Mitral Bovine Bioprosthetic Tissue Valve (size 29mm, model #7300TFX, serial #[id number])   Coronary Artery Bypass Grafting x 2              Left Internal Mammary Artery to Distal Left Anterior Descending Coronary Artery  Saphenous Vein Graft to Distal Right Coronary Artery             Endoscopic Vein Harvest from Right Thigh              Surgeon:        Salvatore Decent. Cornelius Moras, MD  Assistant:       Rowe Clack, PA-C  Anesthesia:    Kipp Brood, MD  Operative Findings: ? Radiation-induced valvular and  coronary artery disease ? Type IIIA mitral valve dysfunction with severe mitral regurgitation ? Normal left ventricular systolic function with moderate left ventricular chamber enlargement ? Good quality left internal mammary artery conduit for grafting ? Good quality saphenous vein conduit for grafting ? Good quality target vessels for grafting Discharge Exam: Blood pressure 122/61, pulse 72, temperature (!) 97.5 F (36.4 C), temperature source Oral, resp. rate (!) 21, height 5\' 11"  (1.803 m), weight 86.8 kg, SpO2 99 %.  General appearance: alert, cooperative and no distress Heart: regular rate and rhythm Lungs: clear to auscultation bilaterally Abdomen: benign Extremities: min edema Wound: incis healing well  Disposition: Discharge disposition: 01-Home or Self Care       Discharge Instructions    Discharge patient   Complete by:  As directed    Discharge disposition:  01-Home or Self Care   Discharge patient date:  02/01/2018     Allergies as of 02/01/2018   No Known Allergies     Medication List    STOP taking these medications   nitroGLYCERIN 0.4 MG SL tablet Commonly known as:  NITROSTAT   olmesartan 20 MG tablet Commonly known as:  BENICAR     TAKE these medications   aspirin EC 81 MG tablet Take 81 mg by mouth daily.   atorvastatin 80 MG tablet Commonly known as:  LIPITOR Take 1 tablet (80 mg total) by mouth daily at 6 PM.   carvedilol 3.125 MG tablet Commonly known as:  COREG Take 1 tablet (3.125 mg total) by mouth 2 (two) times daily with a meal.   furosemide 40 MG tablet Commonly known as:  LASIX Take 1 tablet (40 mg total) by mouth daily.   levocetirizine 5 MG tablet Commonly known as:  XYZAL Take 5 mg by mouth at bedtime.   levothyroxine 75 MCG tablet Commonly known as:  SYNTHROID, LEVOTHROID TAKE 1 TABLET BY MOUTH  DAILY What changed:    how much to take  how to take this  when to take this   montelukast 10 MG tablet Commonly  known as:  SINGULAIR Take 10 mg by mouth at bedtime.   multivitamin with minerals Tabs tablet Take 1 tablet by mouth daily.   oxyCODONE-acetaminophen 5-325 MG tablet Commonly known as:  PERCOCET/ROXICET Take 1-2 tablets by mouth every 6 (six) hours as needed for up to 7 days for moderate pain.   Potassium Chloride ER 20 MEQ Tbcr Take 10 mEq by mouth daily.   traZODone 50 MG tablet Commonly known as:  DESYREL Take 1 tablet (50 mg total) by mouth at bedtime. What changed:  how much to take   warfarin 5 MG tablet Commonly known as:  COUMADIN Take 1 tablet (5 mg total) by mouth daily at 6 PM. As directed by the coumadin clinic      Follow-up Information    Purcell Nails, MD Follow up.   Specialty:  Cardiothoracic Surgery Why:  See discharge paperwork for a appointment to see the surgeon.  Please obtain a chest x-ray 1/2-hour  prior to this appointment at [facility] imaging which is located in the same office complex on the first floor. Contact information: [address] [place] [address] [phone]        Chrystie Nose, MD Follow up.   Specialty:  Cardiology Why:  Please see discharge paperwork for follow-up appointments to see cardiology as well as Coumadin clinic. Contact information: [address] [place] [address] [phone]         The patient has been discharged on:   1.Beta Blocker:  Yes [  y ]                              No   [   ]                              If No, reason:  2.Ace Inhibitor/ARB: Yes [   ]                                     No  [    ]                                     If No, reason:  3.Statin:   Yes [ y  ]                  No  [   ]                  If No, reason:  4.Ecasa:  Yes  [  y ]                  No   [   ]                  If No, reason:  Signed: Rowe Clack PA-C 02/02/2018, 8:19 AM

## 2018-01-29 NOTE — Progress Notes (Addendum)
[address]       [place] [address]             [phone]      2 Days Post-Op Procedure(s) (LRB): CORONARY ARTERY BYPASS GRAFTING (CABG) x two, using left internal mammary artery and right leg greater saphenous vein harvested endoscopically (N/A) TRANSESOPHAGEAL ECHOCARDIOGRAM (TEE) (N/A) MITRAL VALVE (MV) REPLACEMENT using Magna Mitral Ease Valve Size 29MM (N/A) Subjective: No significant pain  Objective: Vital signs in last 24 hours: Temp:  [97.8 F (36.6 C)-99.5 F (37.5 C)] 98.4 F (36.9 C) (12/20 0721) Pulse Rate:  [68-70] 70 (12/20 0700) Cardiac Rhythm: A-V Sequential paced (12/20 0400) Resp:  [18-34] 25 (12/20 0700) BP: (97-128)/(57-76) 121/69 (12/20 0700) SpO2:  [93 %-100 %] 93 % (12/20 0700) Arterial Line BP: (115-143)/(64-69) 128/64 (12/19 1000) Weight:  [90.1 kg] 90.1 kg (12/20 0600)  Hemodynamic parameters for last 24 hours: PAP: (41-45)/(17-19) 41/17  Intake/Output from previous day: 12/19 0701 - 12/20 0700 In: 603.8 [P.O.:300; I.V.:303.8] Out: 1885 [Urine:1335; Chest Tube:550] Intake/Output this shift: No intake/output data recorded.  General appearance: alert, cooperative and no distress Heart: regular rate and rhythm and no murmur Lungs: dim in lower fields Abdomen: + BS, soft, nontender Extremities: no significant edema Wound: dressings CDI  Lab Results: Recent Labs    01/28/18 1540 01/28/18 1543 01/29/18 0422  WBC 19.1*  --  16.4*  HGB 10.9* 10.5* 10.9*  HCT 33.4* 31.0* 33.5*  PLT 185  --  158   BMET:  Recent Labs    01/28/18 0409  01/28/18 1543 01/29/18 0422  NA 141  --  138 138  K 4.0  --  4.0 4.1  CL 111  --  102 105  CO2 20*  --   --  24  GLUCOSE 162*  --  153* 135*  BUN 14  --  17 21  CREATININE 1.02   < > 1.20 1.31*  CALCIUM 7.5*  --   --  8.3*   < > = values in this interval not displayed.    PT/INR:  Recent Labs    01/29/18 0555  LABPROT 15.5*  INR 1.24   ABG    Component Value  Date/Time   PHART 7.353 01/28/2018 0324   HCO3 19.3 (L) 01/28/2018 0324   TCO2 25 01/28/2018 1543   ACIDBASEDEF 6.0 (H) 01/28/2018 0324   O2SAT 59.7 01/29/2018 0446   CBG (last 3)  Recent Labs    01/28/18 1944 01/28/18 2347 01/29/18 0336  GLUCAP 129* 115* 126*    Meds Scheduled Meds: . aspirin EC  81 mg Oral Daily  . [START ON 01/30/2018] atorvastatin  80 mg Oral q1800  . bisacodyl  10 mg Oral Daily   Or  . bisacodyl  10 mg Rectal Daily  . cetirizine  10 mg Oral QHS  . Chlorhexidine Gluconate Cloth  6 each Topical Daily  . docusate sodium  200 mg Oral Daily  . enoxaparin (LOVENOX) injection  40 mg Subcutaneous QHS  . furosemide  20 mg Intravenous Q6H  . [START ON 01/30/2018] furosemide  40 mg Oral BID  . levothyroxine  75 mcg Oral QAC breakfast  . mouth rinse  15 mL Mouth Rinse BID  . montelukast  10 mg Oral QHS  . moving right along book   Does not apply Once  . pantoprazole  40 mg Oral Daily  . [START ON 01/30/2018] potassium chloride  20 mEq Oral BID  . sodium  chloride flush  10-40 mL Intracatheter Q12H  . sodium chloride flush  3 mL Intravenous Q12H  . traZODone  25 mg Oral QHS  . warfarin  2.5 mg Oral q1800  . Warfarin - Physician Dosing Inpatient   Does not apply q1800   Continuous Infusions: . sodium chloride    . cefUROXime (ZINACEF)  IV Stopped (01/28/18 2110)  . lactated ringers Stopped (01/27/18 1645)   PRN Meds:.metoprolol tartrate, morphine injection, ondansetron (ZOFRAN) IV, oxyCODONE-acetaminophen, sodium chloride flush, sodium chloride flush, traMADol  Xrays Dg Chest Port 1 View  Result Date: 01/29/2018 CLINICAL DATA:  Chest tube present. Recent bypass grafting and mitral valve replacement EXAM: PORTABLE CHEST 1 VIEW COMPARISON:  January 28, 2018 FINDINGS: Chest tube remains on the left without change. There is a mediastinal drain, unchanged. Swan-Ganz catheter has been removed with Cordis tip in the superior vena cava. No evident pneumothorax.  Pacemaker leads are attached to the right atrium and right ventricle. Cardiomegaly is stable. There is a mitral valve replacement with coronary artery bypass grafting. There is persistent left lower lobe consolidation with left pleural effusion. The right lung is clear except for minimal atelectatic change in the right mid to lower lung zones. IMPRESSION: Tube and catheter positions as described without evident pneumothorax. Persistent cardiomegaly with postoperative changes. Persistent left lower lobe consolidation with small left pleural effusion. Mild atelectatic change on the right is stable. No new opacity evident. Electronically Signed   By: Lowella Grip III M.D.   On: 01/29/2018 07:34   Dg Chest Port 1 View  Result Date: 01/28/2018 CLINICAL DATA:  Status post cardiac surgery EXAM: PORTABLE CHEST 1 VIEW COMPARISON:  01/27/2018 FINDINGS: Endotracheal tube and nasogastric catheter have been removed in the interval. Swan-Ganz catheter, mediastinal drain and left thoracostomy catheter are again seen and stable. Pacing device is again noted as are postoperative changes. Cardiac shadow remains enlarged. The right lung remains clear. Persistent left basilar atelectasis is noted. Slight increase in the degree of vascular congestion is noted. IMPRESSION: Increasing central vascular congestion. Stable left basilar atelectasis. Tubes and lines as described. Electronically Signed   By: Inez Catalina M.D.   On: 01/28/2018 07:22   Dg Chest Port 1 View  Result Date: 01/27/2018 CLINICAL DATA:  Post CABG.  Check support apparatuses.  Atelectasis. EXAM: PORTABLE CHEST 1 VIEW COMPARISON:  01/25/2017 FINDINGS: Endotracheal tube appears to be just above the clavicles and at least 6.3 cm above the carina. Again noted is a large cardiac silhouette. Patient has a mitral valve replacement. Mediastinal and chest tubes. Negative for pneumothorax. Nasogastric tube extends into the upper abdomen. There is a right jugular  central line with pulmonary catheter probably in the proximal right pulmonary artery region. The left hemidiaphragm is obscured compatible with atelectasis and volume loss at left lung base. Vascular crowding in the left hilum. Small linear densities in the periphery of the right lung may represent interstitial edema. Densities in the right hilum probably related to postsurgical changes and volume loss. 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 IMPRESSION: Support apparatuses as described. Endotracheal tube may be slightly high measuring at least 6 cm above the carina. Chest tubes without a pneumothorax. Evidence for vascular congestion and mild interstitial edema. Consolidation or volume loss at the left lung base. Electronically Signed   By: Markus Daft M.D.   On: 01/27/2018 17:25    Assessment/Plan: S/P Procedure(s) (LRB): CORONARY ARTERY BYPASS GRAFTING (CABG) x two, using left internal mammary artery and right  leg greater saphenous vein harvested endoscopically (N/A) TRANSESOPHAGEAL ECHOCARDIOGRAM (TEE) (N/A) MITRAL VALVE (MV) REPLACEMENT using Magna Mitral Ease Valve Size 29MM (N/A) POD #2 MVR/CABGx2 1 doing well 2 hemodyn stable in AV paced rhythm(PPM) at 70, off milinone. CoOx 59.7 3 slight bump in creat to 1.31, WT not significantly changed from yesterday, lasix increased to 20 IV q 6. Foley out today 4 sats good on 2 liters- routine pulm toilet, CXR reviewed- routine pulm toilet/rehab/diuresis 5 leukocytosis improving, tmax 99.5- monitor clinically 6 H/H stable mild ABL anemia 7 no thrombocytopenia but trending lower- monitor 8 740 cc from CT yesterday- chest tubes to be removed today. Lines out today 9 D/C pacer wires 10 INR 1.24- cont coumadin 11 sugars ok, no DM hx  LOS: 2 days    John [facility] 01/29/2018 Pager [phone]   I have seen and examined the patient and agree with the assessment and plan as outlined.  Doing very well.  D/C tubes and pacing wires.   Mobilize.  Continue diuresis.  Coumadin.  Add low dose beta blocker.  Continue ASA and statin.  Restart ARB (Benicar) prior to hospital D/C if BP and renal function will permit. Transfer 4E  Rexene Alberts, MD 01/29/2018 8:01 AM

## 2018-01-30 ENCOUNTER — Inpatient Hospital Stay (HOSPITAL_COMMUNITY): Payer: 59

## 2018-01-30 ENCOUNTER — Encounter: Payer: Self-pay | Admitting: Thoracic Surgery (Cardiothoracic Vascular Surgery)

## 2018-01-30 DIAGNOSIS — Z953 Presence of xenogenic heart valve: Secondary | ICD-10-CM

## 2018-01-30 LAB — CBC
HCT: 30.7 % — ABNORMAL LOW (ref 39.0–52.0)
Hemoglobin: 10.3 g/dL — ABNORMAL LOW (ref 13.0–17.0)
MCH: 31.6 pg (ref 26.0–34.0)
MCHC: 33.6 g/dL (ref 30.0–36.0)
MCV: 94.2 fL (ref 80.0–100.0)
Platelets: 141 10*3/uL — ABNORMAL LOW (ref 150–400)
RBC: 3.26 MIL/uL — ABNORMAL LOW (ref 4.22–5.81)
RDW: 13.7 % (ref 11.5–15.5)
WBC: 14 10*3/uL — AB (ref 4.0–10.5)
nRBC: 0 % (ref 0.0–0.2)

## 2018-01-30 LAB — BASIC METABOLIC PANEL
Anion gap: 9 (ref 5–15)
BUN: 21 mg/dL (ref 8–23)
CO2: 28 mmol/L (ref 22–32)
Calcium: 8.2 mg/dL — ABNORMAL LOW (ref 8.9–10.3)
Chloride: 102 mmol/L (ref 98–111)
Creatinine, Ser: 1.21 mg/dL (ref 0.61–1.24)
GFR calc non Af Amer: >60 mL/min (ref >60)
Glucose, Bld: 107 mg/dL — ABNORMAL HIGH (ref 70–99)
Potassium: 3.5 mmol/L (ref 3.5–5.1)
Sodium: 139 mmol/L (ref 135–145)

## 2018-01-30 LAB — PROTIME-INR
INR: 1.12
Prothrombin Time: 14.3 seconds (ref 11.4–15.2)

## 2018-01-30 LAB — GLUCOSE, CAPILLARY
Glucose-Capillary: 112 mg/dL — ABNORMAL HIGH (ref 70–99)
Glucose-Capillary: 149 mg/dL — ABNORMAL HIGH (ref 70–99)

## 2018-01-30 MED ORDER — POTASSIUM CHLORIDE CRYS ER 20 MEQ PO TBCR
20.0000 meq | EXTENDED_RELEASE_TABLET | Freq: Once | ORAL | Status: AC
  Administered 2018-01-30: 20 meq via ORAL
  Filled 2018-01-30: qty 1

## 2018-01-30 MED ORDER — POTASSIUM CHLORIDE CRYS ER 20 MEQ PO TBCR
40.0000 meq | EXTENDED_RELEASE_TABLET | Freq: Once | ORAL | Status: AC
  Administered 2018-01-30: 40 meq via ORAL
  Filled 2018-01-30: qty 2

## 2018-01-30 NOTE — Progress Notes (Signed)
Received call from Dr Tula Nakayama in Radiology with chest xray results.  Small left pneumothorax noted.  Results called to Joellyn Rued, PAC.

## 2018-01-30 NOTE — Progress Notes (Signed)
Pt ambulated in hall 260 ft. Room air standby assist. Tolerated well. Pt has no complaints just feels weak. Pt set up back in bed. Will continue to monitor.

## 2018-01-30 NOTE — Plan of Care (Signed)
Care plans reviewed and patient is progressing.  

## 2018-01-30 NOTE — Anesthesia Postprocedure Evaluation (Signed)
Anesthesia Post Note  Patient: Tyler Hendrix  Procedure(s) Performed: CORONARY ARTERY BYPASS GRAFTING (CABG) x two, using left internal mammary artery and right leg greater saphenous vein harvested endoscopically (N/A Chest) TRANSESOPHAGEAL ECHOCARDIOGRAM (TEE) (N/A ) MITRAL VALVE (MV) REPLACEMENT using Magna Mitral Ease Valve Size 29MM (N/A Chest)     Patient location during evaluation: SICU Anesthesia Type: General Level of consciousness: awake and alert and oriented Pain management: pain level controlled Vital Signs Assessment: post-procedure vital signs reviewed and stable Respiratory status: spontaneous breathing, nonlabored ventilation, respiratory function stable and patient connected to nasal cannula oxygen Cardiovascular status: blood pressure returned to baseline and stable Postop Assessment: no apparent nausea or vomiting Anesthetic complications: no    Last Vitals:  Vitals:   01/30/18 1727 01/30/18 1931  BP: (!) 115/59 117/66  Pulse: 74 69  Resp: (!) 26 (!) 25  Temp: 37 C 36.8 C  SpO2: 100% 94%    Last Pain:  Vitals:   01/30/18 1931  TempSrc: Oral  PainSc:                  Katherin Ramey COKER

## 2018-01-30 NOTE — Progress Notes (Addendum)
[address]       [place] [address]             [phone]      3 Days Post-Op Procedure(s) (LRB): CORONARY ARTERY BYPASS GRAFTING (CABG) x two, using left internal mammary artery and right leg greater saphenous vein harvested endoscopically (N/A) TRANSESOPHAGEAL ECHOCARDIOGRAM (TEE) (N/A) MITRAL VALVE (MV) REPLACEMENT using Magna Mitral Ease Valve Size 29MM (N/A) Subjective: Feels okay this morning. He is coughing which causes pain over his incision. Also, he isn't getting any sleep.   Objective: Vital signs in last 24 hours: Temp:  [98.4 F (36.9 C)-99.1 F (37.3 C)] 98.6 F (37 C) (12/21 0900) Pulse Rate:  [68-99] 71 (12/21 0856) Cardiac Rhythm: A-V Sequential paced (12/20 2156) Resp:  [19-34] 24 (12/21 0312) BP: (124-142)/(66-87) 124/87 (12/21 0856) SpO2:  [87 %-99 %] 97 % (12/21 0900) Weight:  [88.1 kg] 88.1 kg (12/21 0312)     Intake/Output from previous day: 12/20 0701 - 12/21 0700 In: 480 [P.O.:480] Out: 685 [Urine:665; Chest Tube:20] Intake/Output this shift: No intake/output data recorded.  General appearance: alert, cooperative and no distress Heart: regular rate and rhythm, S1, S2 normal, no murmur, click, rub or gallop Lungs: clear to auscultation bilaterally Abdomen: soft, non-tender; bowel sounds normal; no masses,  no organomegaly Extremities: extremities normal, atraumatic, no cyanosis or edema Wound: clean and dry  Lab Results: Recent Labs    01/29/18 0422 01/30/18 0304  WBC 16.4* 14.0*  HGB 10.9* 10.3*  HCT 33.5* 30.7*  PLT 158 141*   BMET:  Recent Labs    01/29/18 0422 01/30/18 0304  NA 138 139  K 4.1 3.5  CL 105 102  CO2 24 28  GLUCOSE 135* 107*  BUN 21 21  CREATININE 1.31* 1.21  CALCIUM 8.3* 8.2*    PT/INR:  Recent Labs    01/30/18 0304  LABPROT 14.3  INR 1.12   ABG    Component Value Date/Time   PHART 7.353 01/28/2018 0324   HCO3 19.3 (L) 01/28/2018 0324   TCO2 25 01/28/2018 1543   ACIDBASEDEF 6.0 (H) 01/28/2018 0324   O2SAT 59.7 01/29/2018 0446   CBG (last 3)  Recent Labs    01/28/18 1944 01/28/18 2347 01/29/18 0336  GLUCAP 129* 115* 126*    Assessment/Plan: S/P Procedure(s) (LRB): CORONARY ARTERY BYPASS GRAFTING (CABG) x two, using left internal mammary artery and right leg greater saphenous vein harvested endoscopically (N/A) TRANSESOPHAGEAL ECHOCARDIOGRAM (TEE) (N/A) MITRAL VALVE (MV) REPLACEMENT using Magna Mitral Ease Valve Size 29MM (N/A)  1. CV-hemodynamically stable, AV paced with PPM at 70.  Blood pressure is well controlled.  Continue aspirin, statin, and Coreg.  2.  Pulmonary-he is tolerating room air with good oxygen saturation.  Encourage incentive spirometry.  Small pneumothorax on chest x-ray today status post chest tube removal yesterday.  Will monitor. 3.  Renal-creatinine is 1.21 today.  Replace potassium.  Continue Lasix 40 mg oral twice daily. 4.  Endocrine-last blood glucose levels were 129/115/126.  Well-controlled on current regimen. 5.  Acute blood loss anemia.  Last H&H 10.3/30.7.  Stable. 6.  Continue low-dose Coumadin.  INR today was 1.12.   Plan: Give lasix earlier today so the patient can get some sleep-scheduled for 1800. On home dose of trazodone. Also asking about dropping his pacer down to 60bpm instead of 70bpm since he states this also helps him sleep-will discuss with Dr. Servando Snare.    LOS: 3 days    Johann Capers  N Conte 01/30/2018  Congested with cough Wants paver decreased from 70 to 60 because he can not sleep I have seen and examined Nicholas Lose and agree with the above assessment  and plan.  Grace Isaac MD Beeper [phone] Office [phone] 01/30/2018 12:24 PM

## 2018-01-30 NOTE — Progress Notes (Signed)
CARDIAC REHAB PHASE I   PRE:  Rate/Rhythm: 70 paced  MODE:  Ambulation: 470 ft   POST:  Rate/Rhythm: 84 paced  BP:  Sitting: 120/67    SaO2: 97 RA   Pt seen ambulating in hallway independently with front wheel walker. Pt states he feels a "little weak" and only c/o pain with cough. Reviewed importance of walks, IS and sternal precautions with pt. Pt demonstrates 1000 on IS. Pt given in-the-tube sheet and heart healthy diet. Left exercise guidelines at bedside. Pt declining CRP II. Will continue to follow.  Duncan, RN BSN 01/30/2018 11:28 AM

## 2018-01-31 ENCOUNTER — Inpatient Hospital Stay (HOSPITAL_COMMUNITY): Payer: 59

## 2018-01-31 DIAGNOSIS — I340 Nonrheumatic mitral (valve) insufficiency: Secondary | ICD-10-CM

## 2018-01-31 DIAGNOSIS — Z950 Presence of cardiac pacemaker: Secondary | ICD-10-CM

## 2018-01-31 LAB — GLUCOSE, CAPILLARY: Glucose-Capillary: 110 mg/dL — ABNORMAL HIGH (ref 70–99)

## 2018-01-31 LAB — PROTIME-INR
INR: 1.1
Prothrombin Time: 14.1 seconds (ref 11.4–15.2)

## 2018-01-31 MED ORDER — FUROSEMIDE 40 MG PO TABS
40.0000 mg | ORAL_TABLET | Freq: Two times a day (BID) | ORAL | Status: DC
  Administered 2018-01-31 – 2018-02-01 (×2): 40 mg via ORAL
  Filled 2018-01-31 (×3): qty 1

## 2018-01-31 MED ORDER — FUROSEMIDE 40 MG PO TABS
40.0000 mg | ORAL_TABLET | Freq: Once | ORAL | Status: AC
  Administered 2018-01-31: 40 mg via ORAL

## 2018-01-31 MED ORDER — WARFARIN SODIUM 5 MG PO TABS
5.0000 mg | ORAL_TABLET | Freq: Every day | ORAL | Status: DC
  Administered 2018-01-31: 5 mg via ORAL
  Filled 2018-01-31: qty 1

## 2018-01-31 NOTE — Progress Notes (Addendum)
[address]       [place] [address]             [phone]      4 Days Post-Op Procedure(s) (LRB): CORONARY ARTERY BYPASS GRAFTING (CABG) x two, using left internal mammary artery and right leg greater saphenous vein harvested endoscopically (N/A) TRANSESOPHAGEAL ECHOCARDIOGRAM (TEE) (N/A) MITRAL VALVE (MV) REPLACEMENT using Magna Mitral Ease Valve Size 29MM (N/A) Subjective: Slept much better last night since he got the lasix earlier. Feel much better today.   Objective: Vital signs in last 24 hours: Temp:  [98 F (36.7 C)-98.6 F (37 C)] 98.1 F (36.7 C) (12/22 0527) Pulse Rate:  [69-79] 70 (12/22 0813) Cardiac Rhythm: A-V Sequential paced (12/21 1900) Resp:  [17-36] 25 (12/22 0527) BP: (105-132)/(59-76) 113/60 (12/22 0813) SpO2:  [94 %-100 %] 95 % (12/22 0527) Weight:  [87 kg] 87 kg (12/22 0527)     Intake/Output from previous day: 12/21 0701 - 12/22 0700 In: 1680 [P.O.:1680] Out: 1700 [Urine:1700] Intake/Output this shift: No intake/output data recorded.  General appearance: alert, cooperative and no distress Heart: regular rate and rhythm, S1, S2 normal, no murmur, click, rub or gallop Lungs: clear to auscultation bilaterally Abdomen: soft, non-tender; bowel sounds normal; no masses,  no organomegaly Extremities: upper extremity edema L > R  Wound: clean and dry  Lab Results: Recent Labs    01/29/18 0422 01/30/18 0304  WBC 16.4* 14.0*  HGB 10.9* 10.3*  HCT 33.5* 30.7*  PLT 158 141*   BMET:  Recent Labs    01/29/18 0422 01/30/18 0304  NA 138 139  K 4.1 3.5  CL 105 102  CO2 24 28  GLUCOSE 135* 107*  BUN 21 21  CREATININE 1.31* 1.21  CALCIUM 8.3* 8.2*    PT/INR:  Recent Labs    01/31/18 0409  LABPROT 14.1  INR 1.10   ABG    Component Value Date/Time   PHART 7.353 01/28/2018 0324   HCO3 19.3 (L) 01/28/2018 0324   TCO2 25 01/28/2018 1543   ACIDBASEDEF 6.0 (H) 01/28/2018 0324   O2SAT 59.7 01/29/2018 0446    CBG (last 3)  Recent Labs    01/30/18 1133 01/30/18 2128 01/31/18 0618  GLUCAP 112* 149* 110*    Assessment/Plan: S/P Procedure(s) (LRB): CORONARY ARTERY BYPASS GRAFTING (CABG) x two, using left internal mammary artery and right leg greater saphenous vein harvested endoscopically (N/A) TRANSESOPHAGEAL ECHOCARDIOGRAM (TEE) (N/A) MITRAL VALVE (MV) REPLACEMENT using Magna Mitral Ease Valve Size 29MM (N/A)  1. CV-hemodynamically stable, AV paced with PPM at 70.  Blood pressure is well controlled.  Continue aspirin, statin, and Coreg.  2.  Pulmonary-he is tolerating room air with good oxygen saturation.  Encourage incentive spirometry.  Small pneumothorax on chest x-ray today status post chest tube removal yesterday. Await f/u CXR this morning.  3.  Renal-creatinine is 1.21.  Replace potassium.  Continue Lasix 40 mg oral twice daily. I will order a BMP for the morning.  4.  Endocrine-last blood glucose levels were 112/149/110.  Well-controlled on current regimen. 5.  Acute blood loss anemia.  Last H&H 10.3/30.7.  Stable. 6.  Continue low-dose Coumadin.  INR today was 1.10. Continues to decrease. Will increase to 5mg .   Plan:  Await CXR from this morning. Left upper extremity edema-keep elevated. Continue diuretics-he remains about 3 lbs over baseline weight. Could get an [place] to look for DVT although no previous IV in this arm. IV in the hand  appears ok. Increase coumadin.    CLINICAL DATA:  Pneumothorax.  EXAM: PORTABLE CHEST 1 VIEW  COMPARISON:  01/30/2018  FINDINGS: Left-sided pacemaker unchanged. Lungs are adequately inflated and demonstrate slight improvement in small left apical pneumothorax. Persistent hazy left base opacification likely effusion with atelectasis. Stable cardiomegaly. Remainder of the exam is unchanged.  IMPRESSION: Slight improvement in tiny right apical pneumothorax. Stable hazy left base opacification likely effusion with atelectasis.  Mild  stable cardiomegaly.   Electronically Signed   By: Marin Olp M.D.   On: 01/31/2018 09:51  Results discussed with the patient.    LOS: 4 days    Elgie Collard 01/31/2018  Lab Results  Component Value Date   INR 1.10 01/31/2018   INR 1.12 01/30/2018   INR 1.24 01/29/2018    Patient on coumadin,  today  eating spinach salad for lunch unaware of need to control diet while on coumadin  Discussed with cardiology to decrease pacer to 60 at patient request before d/c  Poss home tomorrow

## 2018-01-31 NOTE — Progress Notes (Signed)
Patient ambulated in hallway with walker. Rexanne Inocencio Jessup RN  

## 2018-02-01 ENCOUNTER — Encounter (HOSPITAL_COMMUNITY): Payer: Self-pay | Admitting: Thoracic Surgery (Cardiothoracic Vascular Surgery)

## 2018-02-01 DIAGNOSIS — Z951 Presence of aortocoronary bypass graft: Secondary | ICD-10-CM

## 2018-02-01 LAB — BASIC METABOLIC PANEL
Anion gap: 13 (ref 5–15)
BUN: 20 mg/dL (ref 8–23)
CO2: 27 mmol/L (ref 22–32)
Calcium: 8.3 mg/dL — ABNORMAL LOW (ref 8.9–10.3)
Chloride: 101 mmol/L (ref 98–111)
Creatinine, Ser: 1.08 mg/dL (ref 0.61–1.24)
GFR calc non Af Amer: >60 mL/min (ref >60)
Glucose, Bld: 108 mg/dL — ABNORMAL HIGH (ref 70–99)
Potassium: 4.5 mmol/L (ref 3.5–5.1)
Sodium: 141 mmol/L (ref 135–145)

## 2018-02-01 LAB — CBC
HCT: 31.1 % — ABNORMAL LOW (ref 39.0–52.0)
Hemoglobin: 10.1 g/dL — ABNORMAL LOW (ref 13.0–17.0)
MCH: 31.1 pg (ref 26.0–34.0)
MCHC: 32.5 g/dL (ref 30.0–36.0)
MCV: 95.7 fL (ref 80.0–100.0)
Platelets: 198 10*3/uL (ref 150–400)
RBC: 3.25 MIL/uL — ABNORMAL LOW (ref 4.22–5.81)
RDW: 13.4 % (ref 11.5–15.5)
WBC: 9.4 10*3/uL (ref 4.0–10.5)
nRBC: 0 % (ref 0.0–0.2)

## 2018-02-01 LAB — PROTIME-INR
INR: 1.11
Prothrombin Time: 14.2 seconds (ref 11.4–15.2)

## 2018-02-01 MED ORDER — FUROSEMIDE 40 MG PO TABS: 40.0000 mg | ORAL_TABLET | Freq: Every day | ORAL | 1 refills | Status: DC

## 2018-02-01 MED ORDER — CARVEDILOL 3.125 MG PO TABS: 3.1250 mg | ORAL_TABLET | Freq: Two times a day (BID) | ORAL | 1 refills | Status: DC

## 2018-02-01 MED ORDER — OXYCODONE-ACETAMINOPHEN 5-325 MG PO TABS: 1.0000 | ORAL_TABLET | Freq: Four times a day (QID) | ORAL | 0 refills | Status: AC | PRN

## 2018-02-01 MED ORDER — WARFARIN SODIUM 5 MG PO TABS: 5.0000 mg | ORAL_TABLET | Freq: Every day | ORAL | 1 refills | Status: DC

## 2018-02-01 MED ORDER — POTASSIUM CHLORIDE ER 20 MEQ PO TBCR: 10.0000 meq | EXTENDED_RELEASE_TABLET | Freq: Every day | ORAL | 1 refills | Status: DC

## 2018-02-01 MED FILL — FUROSEMIDE 40 MG TABLET: 40 | 30 days supply | Qty: 30 | Fill #0

## 2018-02-01 MED FILL — WARFARIN SODIUM 5 MG TABLET: 5 | 30 days supply | Qty: 60 | Fill #0

## 2018-02-01 MED FILL — OXYCODONE W/APAP 5/325 TAB: 5-325 | 7 days supply | Qty: 30 | Fill #0

## 2018-02-01 MED FILL — POTASSIUM CL ER 20 MEQ TABL: 20 | 30 days supply | Qty: 30 | Fill #0

## 2018-02-01 MED FILL — CARVEDILOL 3.125 MG TABLET: 3.125 | 30 days supply | Qty: 60 | Fill #0

## 2018-02-01 NOTE — Plan of Care (Signed)
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 Pt now s/p CABG x 2 12/18.  Met with pt in room today. Pt reports good appetite and oral intake at baseline but reports his appetite has not returned since having the surgery. Pt reports that he ate fairly well for breakfast this morning and that he feels his appetite is slowly improving. RD discussed with pt the importance of adequate nutrition needed for post op healing. Recommend weekly monitoring of weights and addition of protein supplements if needed.   RD provided vitamin K and medication education to patient today. Educated pt that the goal is not to avoid foods that contain vitamin K as these food are often rich in fiber and micronutrients but rather to monitor intake and eat these foods consistently. Provided pt with " Vitamin K and Medication" handout from the Academy of [employer] Dietetics. RD also provided a list of vitamin K containing foods that includes the amount of vitamin K listed for each food item. Explained to patient how vitamin K affects blood thinning medications and how it is difficult to regulate INR if vitamin K intake in inconsistent. Also educated pt that multivitamins and supplements also contain vitamin K and must be taken consistently.   Teach back method was used and pt verbalized understanding  Expect good compliance   No further nutrition interventions are needed. Please re-consult if anything further is needed  Koleen Distance MS, RD, LDN Pager #-  [phone] Office#- [phone] After Hours Pager: [phone]

## 2018-02-01 NOTE — Progress Notes (Signed)
Patient inquiring about Pacemaker being checked/rate before discharge [facility] made aware through [facility] system. Will monitor patient. [name], Bettina Gavia RN

## 2018-02-01 NOTE — Progress Notes (Signed)
Patient ambulated in hallway with wife. Will monitor patient. Carr Shartzer Jessup RN  

## 2018-02-01 NOTE — Progress Notes (Signed)
[address]       [place] [address]             [phone]      5 Days Post-Op Procedure(s) (LRB): CORONARY ARTERY BYPASS GRAFTING (CABG) x two, using left internal mammary artery and right leg greater saphenous vein harvested endoscopically (N/A) TRANSESOPHAGEAL ECHOCARDIOGRAM (TEE) (N/A) MITRAL VALVE (MV) REPLACEMENT using Magna Mitral Ease Valve Size 29MM (N/A) Subjective: Looks and feels well  Objective: Vital signs in last 24 hours: Temp:  [97.9 F (36.6 C)-98.9 F (37.2 C)] 97.9 F (36.6 C) (12/23 0451) Pulse Rate:  [68-70] 68 (12/23 0451) Cardiac Rhythm: A-V Sequential paced (12/23 0700) Resp:  [17-25] 18 (12/23 0451) BP: (98-129)/(52-77) 123/77 (12/23 0451) SpO2:  [94 %-100 %] 94 % (12/23 0451) Weight:  [86.8 kg] 86.8 kg (12/23 0451)  Hemodynamic parameters for last 24 hours:    Intake/Output from previous day: 12/22 0701 - 12/23 0700 In: 1320 [P.O.:1320] Out: 2050 [Urine:2050] Intake/Output this shift: No intake/output data recorded.  General appearance: alert, cooperative and no distress Heart: regular rate and rhythm Lungs: clear to auscultation bilaterally Abdomen: benign Extremities: min edema Wound: incis healing well  Lab Results: Recent Labs    01/30/18 0304 02/01/18 0315  WBC 14.0* 9.4  HGB 10.3* 10.1*  HCT 30.7* 31.1*  PLT 141* 198   BMET:  Recent Labs    01/30/18 0304 02/01/18 0315  NA 139 141  K 3.5 4.5  CL 102 101  CO2 28 27  GLUCOSE 107* 108*  BUN 21 20  CREATININE 1.21 1.08  CALCIUM 8.2* 8.3*    PT/INR:  Recent Labs    02/01/18 0315  LABPROT 14.2  INR 1.11   ABG    Component Value Date/Time   PHART 7.353 01/28/2018 0324   HCO3 19.3 (L) 01/28/2018 0324   TCO2 25 01/28/2018 1543   ACIDBASEDEF 6.0 (H) 01/28/2018 0324   O2SAT 59.7 01/29/2018 0446   CBG (last 3)  Recent Labs    01/30/18 1133 01/30/18 2128 01/31/18 0618  GLUCAP 112* 149* 110*    Meds Scheduled Meds: . aspirin EC   81 mg Oral Daily  . atorvastatin  80 mg Oral q1800  . bisacodyl  10 mg Oral Daily   Or  . bisacodyl  10 mg Rectal Daily  . carvedilol  3.125 mg Oral BID WC  . cetirizine  10 mg Oral QHS  . docusate sodium  200 mg Oral Daily  . enoxaparin (LOVENOX) injection  40 mg Subcutaneous QHS  . furosemide  40 mg Oral BID  . levothyroxine  75 mcg Oral QAC breakfast  . mouth rinse  15 mL Mouth Rinse BID  . montelukast  10 mg Oral QHS  . moving right along book   Does not apply Once  . pantoprazole  40 mg Oral Daily  . sodium chloride flush  10-40 mL Intracatheter Q12H  . sodium chloride flush  3 mL Intravenous Q12H  . traZODone  50 mg Oral QHS  . warfarin  5 mg Oral q1800  . Warfarin - Physician Dosing Inpatient   Does not apply q1800   Continuous Infusions: . sodium chloride    . lactated ringers Stopped (01/27/18 1645)   PRN Meds:.metoprolol tartrate, morphine injection, ondansetron (ZOFRAN) IV, oxyCODONE-acetaminophen, sodium chloride flush, sodium chloride flush, traMADol  Xrays Dg Chest Port 1 View  Result Date: 01/31/2018 CLINICAL DATA:  Pneumothorax. EXAM: PORTABLE CHEST 1 VIEW COMPARISON:  01/30/2018 FINDINGS:  Left-sided pacemaker unchanged. Lungs are adequately inflated and demonstrate slight improvement in small left apical pneumothorax. Persistent hazy left base opacification likely effusion with atelectasis. Stable cardiomegaly. Remainder of the exam is unchanged. IMPRESSION: Slight improvement in tiny right apical pneumothorax. Stable hazy left base opacification likely effusion with atelectasis. Mild stable cardiomegaly. Electronically Signed   By: Marin Olp M.D.   On: 01/31/2018 09:51    Assessment/Plan: S/P Procedure(s) (LRB): CORONARY ARTERY BYPASS GRAFTING (CABG) x two, using left internal mammary artery and right leg greater saphenous vein harvested endoscopically (N/A) TRANSESOPHAGEAL ECHOCARDIOGRAM (TEE) (N/A) MITRAL VALVE (MV) REPLACEMENT using Magna Mitral Ease  Valve Size 29MM (N/A) Plan for discharge: see discharge orders Labs stable INR 1.11, cont 5 mg coumadin- check INR Friday Reduce lasix to daily   LOS: 5 days    John Giovanni Wk [facility] 02/01/2018 Pager [phone]

## 2018-02-01 NOTE — Care Management Note (Signed)
Case Management Note Marvetta Gibbons RN, BSN Transitions of Care Unit 4E- RN Case Manager [phone]  Patient Details  Name: [name] MRN: [id number] Date of Birth: [birth year]  Subjective/Objective:   Pt admitted ws/p CABGx2 and MVR                 Action/Plan: PTA pt lived at home with spouse, plan to return home with spouse- no CM needs noted for transition home, [facility] pharmacy to deliver meds to bedside prior to discharge.   Expected Discharge Date:  02/01/18               Expected Discharge Plan:  Home/Self Care  In-House Referral:  NA  Discharge planning Services  CM Consult  Post Acute Care Choice:  NA Choice offered to:  NA  DME Arranged:    DME Agency:     HH Arranged:    HH Agency:     Status of Service:  Completed, signed off  If discussed at [place] of Stay Meetings, dates discussed:    Discharge Disposition: home/self care   Additional Comments:  Dawayne Patricia, RN 02/01/2018, 11:32 AM

## 2018-02-01 NOTE — Progress Notes (Signed)
   Dr. Debara Pickett requested that the patient's pacemaker rate be turned down to 60 bpm, Marcio with [employer] made aware.  Patient is for discharge today per the surgical team, has follow-up appointment arranged.

## 2018-02-01 NOTE — Progress Notes (Signed)
CT sutures removed as ordered and steri strips applied. Reviewed discharge instructions, medication list and follow up appointments with patient and spouse. Verbalized understanding. Patient with medications for home delivered to room. IV and tele were dcd. Pt with a previous skin tear to back dressing removed and pink foam placed for skin protection. Will discharge home as ordered. Transported to exit via wheel chair and nursing staff. Will be transported home by wife. Joeseph [place]erville, Bettina Gavia RN

## 2018-02-01 NOTE — Progress Notes (Signed)
CARDIAC REHAB PHASE I   Pt denied ambulation. Had one walk this morning. Pt educated on diet,exercise, sternal precautions, IS use, CRPII, and incision care. Pt denied CRPII at [facility].   [phone]  Jeralyn Bennett BS, ACSM CEP  9:33 AM 02/01/2018

## 2018-02-01 NOTE — Progress Notes (Signed)
Pt ambulated in hall 470 feet with out assistive device, pt tolerated well will continue to monitor.

## 2018-02-02 LAB — BPAM RBC
BLOOD PRODUCT EXPIRATION DATE: 202001112359
Blood Product Expiration Date: 202001112359
ISSUE DATE / TIME: 201912180816
ISSUE DATE / TIME: 201912180816
Unit Type and Rh: 5100
Unit Type and Rh: 5100

## 2018-02-02 LAB — TYPE AND SCREEN
ABO/RH(D): O POS
Antibody Screen: NEGATIVE
Unit division: 0
Unit division: 0

## 2018-02-02 MED FILL — Sodium Chloride IV Soln 0.9%: INTRAVENOUS | Qty: 2000 | Status: AC

## 2018-02-02 MED FILL — Sodium Bicarbonate IV Soln 8.4%: INTRAVENOUS | Qty: 50 | Status: AC

## 2018-02-02 MED FILL — Electrolyte-R (PH 7.4) Solution: INTRAVENOUS | Qty: 4000 | Status: AC

## 2018-02-02 MED FILL — Albumin, Human Inj 5%: INTRAVENOUS | Qty: 250 | Status: AC

## 2018-02-02 MED FILL — Mannitol IV Soln 20%: INTRAVENOUS | Qty: 1000 | Status: AC

## 2018-02-02 MED FILL — Heparin Sodium (Porcine) Inj 1000 Unit/ML: INTRAMUSCULAR | Qty: 30 | Status: AC

## 2018-02-02 MED FILL — Lidocaine HCl(Cardiac) IV PF Soln Pref Syr 100 MG/5ML (2%): INTRAVENOUS | Qty: 25 | Status: AC

## 2018-02-05 ENCOUNTER — Ambulatory Visit (INDEPENDENT_AMBULATORY_CARE_PROVIDER_SITE_OTHER): Payer: 59 | Admitting: Pharmacist

## 2018-02-05 DIAGNOSIS — Z7901 Long term (current) use of anticoagulants: Secondary | ICD-10-CM | POA: Diagnosis not present

## 2018-02-05 DIAGNOSIS — Z953 Presence of xenogenic heart valve: Secondary | ICD-10-CM

## 2018-02-05 LAB — POCT INR: INR: 1.4 — AB (ref 2.0–3.0)

## 2018-02-06 ENCOUNTER — Other Ambulatory Visit: Payer: Self-pay | Admitting: Internal Medicine

## 2018-02-09 ENCOUNTER — Telehealth: Payer: Self-pay

## 2018-02-09 ENCOUNTER — Other Ambulatory Visit: Payer: Self-pay | Admitting: *Deleted

## 2018-02-09 DIAGNOSIS — G8918 Other acute postprocedural pain: Secondary | ICD-10-CM

## 2018-02-09 MED ORDER — OXYCODONE HCL 5 MG PO TABS: 5.0000 mg | ORAL_TABLET | Freq: Four times a day (QID) | ORAL | 0 refills | Status: AC | PRN

## 2018-02-09 NOTE — Telephone Encounter (Signed)
JoLeen contacted patient in regards to pain medication.  Patient requested another prescription for Oxycodone because Tramadol does not work for him.  Prescription printed for Dr. Roxy Manns to sign.  Will have patient/ family pick up prescription today.

## 2018-02-09 NOTE — Telephone Encounter (Signed)
-----   Message from Rexene Alberts, MD sent at 02/09/2018 11:15 AM EST ----- Ask him if he would like to try tramadol, but he could have either ----- Message ----- From: Donnella Sham, RN Sent: 02/09/2018   9:19 AM EST To: Rexene Alberts, MD  Patient called this morning requesting refill of oxycodone.  CABG 01/27/18, Discharged 02/01/18.  Has a couple pills left.  States he is taking 3 daily. Trying to wean to 2 daily.  Can he have a refill?  Or Tramadol?  If so, I will print for you to sign when you are here in the office today.  Thanks,  Caryl Pina

## 2018-02-11 ENCOUNTER — Ambulatory Visit (INDEPENDENT_AMBULATORY_CARE_PROVIDER_SITE_OTHER): Payer: 59 | Admitting: Pharmacist Clinician (PhC)/ Clinical Pharmacy Specialist

## 2018-02-11 ENCOUNTER — Encounter: Payer: Self-pay | Admitting: *Deleted

## 2018-02-11 DIAGNOSIS — Z953 Presence of xenogenic heart valve: Secondary | ICD-10-CM

## 2018-02-11 DIAGNOSIS — Z7901 Long term (current) use of anticoagulants: Secondary | ICD-10-CM | POA: Diagnosis not present

## 2018-02-11 LAB — POCT INR: INR: 1.7 — AB (ref 2.0–3.0)

## 2018-02-11 NOTE — Patient Instructions (Signed)
Description   Take extra 1/2 tablet today Thursday Jan 2, then continue with 5 mg daily except for 7.5mg  every Monday, Wednesday, Friday. Repeat INR in 7 days. Patient plans to start Ensure supplement before next visit)

## 2018-02-18 ENCOUNTER — Ambulatory Visit: Payer: 59 | Admitting: Physician Assistant

## 2018-02-18 ENCOUNTER — Encounter: Payer: Self-pay | Admitting: Physician Assistant

## 2018-02-18 ENCOUNTER — Ambulatory Visit (INDEPENDENT_AMBULATORY_CARE_PROVIDER_SITE_OTHER): Payer: 59 | Admitting: Pharmacist

## 2018-02-18 VITALS — BP 132/84 | HR 73 | Ht 71.0 in | Wt 183.2 lb

## 2018-02-18 DIAGNOSIS — Z952 Presence of prosthetic heart valve: Secondary | ICD-10-CM | POA: Diagnosis not present

## 2018-02-18 DIAGNOSIS — Z79899 Other long term (current) drug therapy: Secondary | ICD-10-CM

## 2018-02-18 DIAGNOSIS — Z953 Presence of xenogenic heart valve: Secondary | ICD-10-CM

## 2018-02-18 DIAGNOSIS — I5022 Chronic systolic (congestive) heart failure: Secondary | ICD-10-CM | POA: Diagnosis not present

## 2018-02-18 DIAGNOSIS — Z950 Presence of cardiac pacemaker: Secondary | ICD-10-CM

## 2018-02-18 DIAGNOSIS — I25810 Atherosclerosis of coronary artery bypass graft(s) without angina pectoris: Secondary | ICD-10-CM | POA: Diagnosis not present

## 2018-02-18 DIAGNOSIS — Z7901 Long term (current) use of anticoagulants: Secondary | ICD-10-CM

## 2018-02-18 DIAGNOSIS — D509 Iron deficiency anemia, unspecified: Secondary | ICD-10-CM | POA: Diagnosis not present

## 2018-02-18 DIAGNOSIS — E039 Hypothyroidism, unspecified: Secondary | ICD-10-CM

## 2018-02-18 DIAGNOSIS — I5021 Acute systolic (congestive) heart failure: Secondary | ICD-10-CM | POA: Diagnosis not present

## 2018-02-18 LAB — POCT INR: INR: 1.7 — AB (ref 2.0–3.0)

## 2018-02-18 NOTE — Progress Notes (Signed)
Cardiology Office Note    Date:  02/18/2018   ID:  Tyler Hendrix, DOB Jan 30, 1956, MRN [id number]  PCP:  [name], MD  Cardiologist:  Dr. Rennis Golden Electrophysiologist: Dr. Johney Frame  Chief Complaint  Patient presents with  . Follow-up    seen for Dr. Rennis Golden.     History of Present Illness:  Tyler Hendrix is a 63 y.o. male  with PMH of hodgkin's disease, hypothyroidism, RBBB and CHB s/p PPM. Patient was previously seen by Dr. Elease Hashimoto in September 2015. He has been followed by Dr. Rennis Golden since 2016. He was initially evaluated for palpitations symptoms. He wore a 48-hour Holter monitor in May 2016 which showed PVCs with triplets, quadruplet, and trigeminy. He underwent Myoview on 08/09/2014 that was low risk, normal perfusion and no ST segment deviation during stress test. The study was not gated due to arrhythmia. He had a echocardiogram in 2015 that showed an ejection fraction of 55%. He was placed on metoprolol for rate control and suppression of PVCs. He had episodes of complete heart block in 2017, this recurred despite coming off of beta blocker. Cardiac catheterization performed on 11/02/2015 showed severe stenosis of diagonal branch of the LAD, severe stenosis of mid left circumflex, moderate diffuse mid RCA and mid to distal LAD stenosis, normal LV function. Even during the cardiac catheterization, he was in 2-1 AV block and intermittent complete heart block. After discussing with the electrophysiology service, he eventually underwent Medtronic dual-chamber pacemaker placement on the same day with plan of staged PCI of first diagonal and mid left circumflex once his pacemaker pocket is healed so he can start on Plavix. He returned to the cath lab on 11/21/2015 and underwent successful 2 vessel PCI of first diagonal and mid left circumflex with Synergy DES (2.25x38mm in ost D1, 3x3mm in mid LCx).  He is 100% V paced/pacemaker dependent.  More recently, patient complained of a gurgling  sensation in the chest during his visit with Dr. Johney Frame October 2019.  An echocardiogram was repeated which showed newly decreased EF of 40%, grade 2 DD, moderate to severe MR, PA peak pressure 39 mmHg.  Subsequent cardiac catheterization performed on 12/02/2017 showed 90% D2, previously placed stent in ostial D1 and the proximal left circumflex were widely patent, 80% mid LAD, 50% mid RCA lesion, EF 45 to 50% by LV gram, mild 2+ mitral regurgitation, mean wedge pressure 30 mmHg, mean PA pressure 37/15 mmHg.  It was felt that mitral regurgitation should be further explored.  Patient underwent TEE on 12/09/2017 which showed severe MR due to mild coaptation, EF 40 to 45%.  Patient was evaluated by Dr. Charlann Boxer of CT surgery and eventually underwent CABG x2 with LIMA to LAD and SVG to RCA, mitral valve replacement with [facility] Mitral Ease bioprosthetic valve size 29 mm.  Postprocedure, his pacemaker was set to pace at 60 bpm.  Patient presents today for cardiology office visit.  He is main concern is slowly recovering of his stamina, otherwise he denies significant discomfort.  He has good appetite and sleep well on the current trazodone dose.  He is scheduled to see CT surgery next week.  He is currently on aspirin and Coumadin after recent bypass surgery and mitral valve replacement.  He can exercise close to 20 minutes with a slow walk on the treadmill.  Since discharge, he has been continued on Lasix 40 mg daily and potassium supplement, however he has noticed his weight continue to decline.  I will  change his Lasix and the potassium to as needed basis.  He appears to be somewhat dry on physical exam.  I will obtain CBC and a basic metabolic panel.  We discussed the importance of daily weight.  If his weight increased by more than 3 pounds overnight or 5 pounds in single week, he can take Lasix as needed.  If he has to take as needed dose of Lasix more than 3 times a week, then potentially we need to restart Lasix at  20 mg daily instead.  Otherwise, his mitral valve repair seems to be quite successful, I do not hear any heart murmur on physical exam.   Past Medical History:  Diagnosis Date  . Abnormal TSH   . Arthritis    ddd  . CAD in native artery    a. 11/2015: s/p 2 vessel PCI of the first diagonal branch of the LAD in the mid left circumflex with DES.  Marland Kitchen Complete heart block (HCC)    a. s/p Medtronic ppm 10/2015.  Marland Kitchen Coronary artery disease involving native coronary artery of native heart without angina pectoris   . Dyspnea    d/t current heart problem  . History of radiation therapy 2002  . Hodgkin disease (HCC) 2002   a. s/p chest radiation and chemotherapy  . Hypothyroidism   . Incidental pulmonary nodule 01/19/2018   Left apical opacity - possible scarring  . Mitral regurgitation   . Pacemaker 10/2015   Medtronic  . PVC's (premature ventricular contractions)   . Right bundle branch block   . 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 S/P mitral valve replacement with bioprosthetic valve 01/27/2018   29 mm [facility] Mitral stented bovine pericardial tissue valve    Past Surgical History:  Procedure Laterality Date  . CARDIAC CATHETERIZATION N/A 11/02/2015   Procedure: Left Heart Cath and Coronary Angiography;  Surgeon: Tonny Bollman, MD;  Location: [facility] INVASIVE CV LAB;  Service: Cardiovascular;  Laterality: N/A;  . CARDIAC CATHETERIZATION N/A 11/21/2015   Procedure: Coronary Stent Intervention;  Surgeon: Tonny Bollman, MD;  Location[facility] INVASIVE CV LAB;  Service: Cardiovascular;  Laterality: N/A;  . CARDIAC CATHETERIZATION  11/2017  . CORONARY ARTERY BYPASS GRAFT N/A 01/27/2018   Procedure: CORONARY ARTERY BYPASS GRAFTING (CABG) x two, using left internal mammary artery and right leg greater saphenous vein harvested endoscopically;  Surgeon: Purcell Nails, MD;  Location: [facility] OR;  Service: Open Heart Surgery;  Laterality: N/A;  . EP IMPLANTABLE  DEVICE N/A 11/02/2015   MDT Adivisa MRI conditional dual chamber pacemaker implanted by Dr Johney Frame for complete heart block  . KNEE SURGERY Right 1972  . LUMBAR LAMINECTOMY  1996  . MITRAL VALVE REPLACEMENT N/A 01/27/2018   Procedure: MITRAL VALVE (MV) REPLACEMENT using Magna Mitral Ease Valve Size ;  Surgeon: Purcell Nails, MD;  Location: [facility] OR;  Service: Open Heart Surgery;  Laterality: N/A;  . RIGHT/LEFT HEART CATH AND CORONARY ANGIOGRAPHY N/A 12/02/2017   Procedure: RIGHT/LEFT HEART CATH AND CORONARY ANGIOGRAPHY;  Surgeon: Corky Crafts, MD;  Location: [facility] INVASIVE CV LAB;  Service: Cardiovascular;  Laterality: N/A;  . TEE WITHOUT CARDIOVERSION N/A 12/09/2017   Procedure: TRANSESOPHAGEAL ECHOCARDIOGRAM (TEE);  Surgeon: Jodelle Red, MD;  Location: [facility] ENDOSCOPY;  Service: Cardiovascular;  Laterality: N/A;  . TEE WITHOUT CARDIOVERSION N/A 01/27/2018   Procedure: TRANSESOPHAGEAL ECHOCARDIOGRAM (TEE);  Surgeon: Purcell Nails, MD;  Location: [facility] OR;  Service:  Open Heart Surgery;  Laterality: N/A;    Current Medications: Outpatient Medications Prior to Visit  Medication Sig Dispense Refill  . aspirin EC 81 MG tablet Take 81 mg by mouth daily.    Marland Kitchen atorvastatin (LIPITOR) 80 MG tablet TAKE 1 TABLET BY MOUTH  DAILY AT 6 PM. 90 tablet 3  . carvedilol (COREG) 3.125 MG tablet Take 1 tablet (3.125 mg total) by mouth 2 (two) times daily with a meal. 60 tablet 1  . furosemide (LASIX) 40 MG tablet Take 1 tablet (40 mg total) by mouth daily. 30 tablet 1  . levocetirizine (XYZAL) 5 MG tablet Take 5 mg by mouth at bedtime.   3  . levothyroxine (SYNTHROID, LEVOTHROID) 75 MCG tablet TAKE 1 TABLET BY MOUTH  DAILY (Patient taking differently: Take 75 mcg by mouth daily before breakfast. TAKE 1 TABLET BY MOUTH  DAILY) 90 tablet 0  . montelukast (SINGULAIR) 10 MG tablet Take 10 mg by mouth at bedtime.   3  . Multiple Vitamin (MULTIVITAMIN WITH MINERALS) TABS tablet Take 1 tablet by mouth  daily.    . potassium chloride 20 MEQ TBCR Take 10 mEq by mouth daily. 30 tablet 1  . traZODone (DESYREL) 50 MG tablet Take 1 tablet (50 mg total) by mouth at bedtime. (Patient taking differently: Take 25 mg by mouth at bedtime. ) 90 tablet 3  . warfarin (COUMADIN) 5 MG tablet Take 1 tablet (5 mg total) by mouth daily at 6 PM. As directed by the coumadin clinic 100 tablet 1   No facility-administered medications prior to visit.      Allergies:   Patient has no known allergies.   Social History   Socioeconomic History  . Marital status: Married    Spouse name: Not on file  . Number of children: 2  . Years of education: Masters  . Highest education level: Not on file  Occupational History  . Occupation: Clinical research associate    Comment: [place]  Social Needs  . Financial resource strain: Not on file  . Food insecurity:    Worry: Not on file    Inability: Not on file  . Transportation needs:    Medical: Not on file    Non-medical: Not on file  Tobacco Use  . Smoking status: Never Smoker  . Smokeless tobacco: Never Used  Substance and Sexual Activity  . Alcohol use: Yes    Alcohol/week: 5.0 standard drinks    Types: 5 Glasses of wine per week  . Drug use: No  . Sexual activity: Not on file  Lifestyle  . Physical activity:    Days per week: Not on file    Minutes per session: Not on file  . Stress: Not on file  Relationships  . Social connections:    Talks on phone: Not on file    Gets together: Not on file    Attends religious service: Not on file    Active member of club or organization: Not on file    Attends meetings of clubs or organizations: Not on file    Relationship status: Not on file  Other Topics Concern  . 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   ROS:   Please see the  history of present illness.    ROS  All other systems reviewed and are negative.   PHYSICAL EXAM:   VS:  BP 132/84   Pulse 73   Ht 5\' 11"  (1.803 m)   Wt 183 lb 3.2 oz (83.1 kg)   BMI 25.55 kg/m    GEN: Well nourished, well developed, in no acute distress  HEENT: normal  Neck: no JVD, carotid bruits, or masses Cardiac: RRR; no murmurs, rubs, or gallops,no edema  Sternal wound well healed.  Respiratory:  clear to auscultation bilaterally, normal work of breathing. Mild intermittent crackle in the left base GI: soft, nontender, nondistended, + BS MS: no deformity or atrophy  Skin: warm and dry, no rash Neuro:  Alert and Oriented x 3, Strength and sensation are intact Psych: euthymic mood, full affect  Wt Readings from Last 3 Encounters:  02/18/18 183 lb 3.2 oz (83.1 kg)  02/01/18 191 lb 4.8 oz (86.8 kg)  01/25/18 190 lb (86.2 kg)      Studies/Labs Reviewed:   EKG:  EKG is ordered today.  The ekg ordered today demonstrates normal sinus rhythm with left bundle branch block  Recent Labs: 05/12/2017: TSH 2.50 01/25/2018: ALT 34 01/28/2018: Magnesium 2.7 02/01/2018: BUN 20; Creatinine, Ser 1.08; Hemoglobin 10.1; Platelets 198; Potassium 4.5; Sodium 141   Lipid Panel    Component Value Date/Time   CHOL 93 05/12/2017 0941   TRIG 83.0 05/12/2017 0941   HDL 35.20 (L) 05/12/2017 0941   CHOLHDL 3 05/12/2017 0941   VLDL 16.6 05/12/2017 0941   LDLCALC 42 05/12/2017 0941   LDLDIRECT 88.6 10/17/2011 0846    Additional studies/ records that were reviewed today include:   Echo 11/25/2017 LV EF: 40% Study Conclusions  - Left ventricle: The cavity size was mildly dilated. Wall   thickness was increased in a pattern of mild LVH. Severe   hypokinesis of the anteroseptal wall and the apex. The estimated   ejection fraction was 40%. Features are consistent with a   pseudonormal left ventricular filling pattern, with concomitant   abnormal relaxation and increased filling pressure  (grade 2   diastolic dysfunction). - Aortic valve: There was no stenosis. - Mitral valve: Mildly calcified annulus. There was moderate to   severe regurgitation. - Left atrium: The atrium was moderately to severely dilated. - Right ventricle: The cavity size was normal. Pacer wire or   catheter noted in right ventricle. Systolic function was normal. - Right atrium: The atrium was mildly dilated. - Tricuspid valve: Peak RV-RA gradient (S): 36 mm Hg. - Pulmonary arteries: PA peak pressure: 39 mm Hg (S). - Pericardium, extracardiac: A trivial pericardial effusion was   identified.  Impressions:  - Mildly dilated LV with mild LV hypertrophy. EF 40% with severe   hypokinesis of the anteroseptal wall and the apex. Moderate   diastolic dysfunction. Normal RV size and systolic function. Mild   pulmonary hypertension. Biatrial enlargement. There was moderate   to severe mitral regurgitation with a degree of posterior leaflet   restriction.   Cath 12/02/2017  2nd Diag lesion is 90% stenosed.  Previously placed Ost 1st Diag to 1st Diag drug eluting stent is widely patent.  Previously placed Prox Cx to Mid Cx drug eluting stent is widely patent.  Mid RCA lesion is 50% stenosed.  Mid LAD lesion is 80% stenosed. This has progressed from the prior cath.  There is mild left ventricular systolic dysfunction.  The left ventricular ejection fraction is 45-50% by visual estimate.  LV end diastolic pressure is normal.  There  is no aortic valve stenosis.  There is mild (2+) mitral regurgitation.  Ao sat 90%, PA sat 70%, mean PA 37/15 mm Hg, mean PA 21 mm Hg; mean PCWP 13 mm Hg; CO 6.9 L/min; CI 3.35; no prominent V waves on wedge tracing   Discussed with Dr. Johney Frame that mitral regurgitation would be further evaluated.  If mitral regurgitation does not require surgery, will plan to bring him back for PCI of the LAD.   Mitral regurgitation did not appear severe by cardiac cath.    Explained rationale to patient.  Continue with aggressive secondary prevention.   ASSESSMENT:    1. H/O mitral valve replacement   2. Iron deficiency anemia, unspecified iron deficiency anemia type   3. Chronic systolic heart failure (HCC)   4. Medication management   5. Hypothyroidism, unspecified type   6. Pacemaker   7. Coronary artery disease involving coronary bypass graft of native heart without angina pectoris      PLAN:  In order of problems listed above:  1. Severe MR s/p bioprosthetic mitral valve replacement: Currently on Coumadin for 3 months before continue on aspirin alone.  No heart murmur on physical exam after the recent repair  2. Chronic systolic heart failure: EF 40% on echocardiogram.  Currently on low-dose carvedilol started in the hospital.  If renal function stable, I plan to add back to previous Benicar for the LV dysfunction.  Obtain basic metabolic panel, he appears to be dry on physical exam.  I have changed his Lasix and potassium to as needed.  3. Complete heart block s/p pacemaker: Managed by Dr. Johney Frame  4. CAD s/p CABG: Patient is recovering well, sternal wound well-healed on physical exam.  He denies any chest discomfort.  Continue aspirin and Lipitor  5. Anemia: Obtain CBC  6. Hypothyroidism: Managed by primary care provider    Medication Adjustments/Labs and Tests Ordered: Current medicines are reviewed at length with the patient today.  Concerns regarding medicines are outlined above.  Medication changes, Labs and Tests ordered today are listed in the Patient Instructions below. Patient Instructions  Medication Instructions:  TAKE- Potassium and Furosemide(Lasix) as needed  If you need a refill on your cardiac medications before your next appointment, please call your pharmacy.   Lab work: BMP and CBC Today  If you have labs (blood work) drawn today and your tests are completely normal, you will receive your results only  by: Marland Kitchen MyChart Message (if you have MyChart) OR . A paper copy in the mail If you have any lab test that is abnormal or we need to change your treatment, we will call you to review the results.  Testing/Procedures: None Ordered  Follow-Up: At [facility], you and your health needs are our priority.  As part of our continuing mission to provide you with exceptional heart care, we have created designated Provider Care Teams.  These Care Teams include your primary Cardiologist (physician) and Advanced Practice Providers (APPs -  Physician Assistants and Nurse Practitioners) who all work together to provide you with the care you need, when you need it. . Your physician recommends that you schedule a follow-up appointment in: 2-3 Months with Dr Rennis Golden   Any Other Special Instructions Will Be Listed Below (If Applicable).       Ramond Dial, [place]  02/18/2018 9:58 AM    [facility] [address] [place], [place], [place]  [address] Phone: [phone]; Fax: [phone]

## 2018-02-18 NOTE — Patient Instructions (Signed)
Medication Instructions:  TAKE- Potassium and Furosemide(Lasix) as needed  If you need a refill on your cardiac medications before your next appointment, please call your pharmacy.   Lab work: BMP and CBC Today  If you have labs (blood work) drawn today and your tests are completely normal, you will receive your results only by: Marland Kitchen MyChart Message (if you have MyChart) OR . A paper copy in the mail If you have any lab test that is abnormal or we need to change your treatment, we will call you to review the results.  Testing/Procedures: None Ordered  Follow-Up: At [facility], you and your health needs are our priority.  As part of our continuing mission to provide you with exceptional heart care, we have created designated Provider Care Teams.  These Care Teams include your primary Cardiologist (physician) and Advanced Practice Providers (APPs -  Physician Assistants and Nurse Practitioners) who all work together to provide you with the care you need, when you need it. . Your physician recommends that you schedule a follow-up appointment in: 2-3 Months with Dr Debara Pickett   Any Other Special Instructions Will Be Listed Below (If Applicable).

## 2018-02-19 ENCOUNTER — Other Ambulatory Visit: Payer: Self-pay | Admitting: Physician Assistant

## 2018-02-19 ENCOUNTER — Other Ambulatory Visit: Payer: Self-pay | Admitting: Thoracic Surgery (Cardiothoracic Vascular Surgery)

## 2018-02-19 DIAGNOSIS — Z951 Presence of aortocoronary bypass graft: Secondary | ICD-10-CM

## 2018-02-19 LAB — CBC
HEMATOCRIT: 36 % — AB (ref 37.5–51.0)
Hemoglobin: 11.8 g/dL — ABNORMAL LOW (ref 13.0–17.7)
MCH: 29.1 pg (ref 26.6–33.0)
MCHC: 32.8 g/dL (ref 31.5–35.7)
MCV: 89 fL (ref 79–97)
Platelets: 537 10*3/uL — ABNORMAL HIGH (ref 150–450)
RBC: 4.05 x10E6/uL — ABNORMAL LOW (ref 4.14–5.80)
RDW: 13.1 % (ref 11.6–15.4)
WBC: 8.8 10*3/uL (ref 3.4–10.8)

## 2018-02-19 LAB — BASIC METABOLIC PANEL
BUN/Creatinine Ratio: 13 (ref 10–24)
BUN: 15 mg/dL (ref 8–27)
CO2: 23 mmol/L (ref 20–29)
Calcium: 9.4 mg/dL (ref 8.6–10.2)
Chloride: 99 mmol/L (ref 96–106)
Creatinine, Ser: 1.14 mg/dL (ref 0.76–1.27)
GFR calc Af Amer: 80 mL/min/{1.73_m2} (ref >59)
GFR calc non Af Amer: 69 mL/min/{1.73_m2} (ref >59)
Glucose: 93 mg/dL (ref 65–99)
Potassium: 4.8 mmol/L (ref 3.5–5.2)
Sodium: 138 mmol/L (ref 134–144)

## 2018-02-19 MED ORDER — OLMESARTAN MEDOXOMIL 20 MG PO TABS: 10.0000 mg | ORAL_TABLET | Freq: Every day | ORAL | 1 refills | Status: DC

## 2018-02-19 NOTE — Progress Notes (Signed)
Renal function and electrolyte ok, red blood cell count improving.

## 2018-02-19 NOTE — Progress Notes (Signed)
I have called [name] and restarted him on 10mg  daily Benicar

## 2018-02-22 ENCOUNTER — Ambulatory Visit (INDEPENDENT_AMBULATORY_CARE_PROVIDER_SITE_OTHER): Payer: Self-pay | Admitting: Physician Assistant

## 2018-02-22 ENCOUNTER — Ambulatory Visit
Admission: RE | Admit: 2018-02-22 | Discharge: 2018-02-22 | Disposition: A | Discharge status: Home / Self Care | Payer: 59 | Source: Ambulatory Visit | Attending: Thoracic Surgery (Cardiothoracic Vascular Surgery) | Admitting: Thoracic Surgery (Cardiothoracic Vascular Surgery)

## 2018-02-22 VITALS — BP 144/90 | HR 76 | Resp 20 | Ht 71.0 in | Wt 182.0 lb

## 2018-02-22 DIAGNOSIS — Z951 Presence of aortocoronary bypass graft: Secondary | ICD-10-CM

## 2018-02-22 DIAGNOSIS — R918 Other nonspecific abnormal finding of lung field: Secondary | ICD-10-CM

## 2018-02-22 DIAGNOSIS — Z952 Presence of prosthetic heart valve: Secondary | ICD-10-CM

## 2018-02-22 MED ORDER — OXYCODONE HCL 5 MG PO TABS: 5.0000 mg | ORAL_TABLET | ORAL | 0 refills | Status: DC | PRN

## 2018-02-22 MED ORDER — WARFARIN SODIUM 5 MG PO TABS: 7.5000 mg | ORAL_TABLET | Freq: Every day | ORAL | 1 refills | Status: DC

## 2018-02-22 NOTE — Patient Instructions (Signed)
You may return to driving an automobile as long as you are no longer requiring oral narcotic pain relievers during the daytime.  It would be wise to start driving only short distances during the daylight and gradually increase from there as you feel comfortable.  Endocarditis is a potentially serious infection of heart valves or inside lining of the heart.  It occurs more commonly in patients with diseased heart valves (such as patient's with aortic or mitral valve disease) and in patients who have undergone heart valve repair or replacement.  Certain surgical and dental procedures may put you at risk, such as dental cleaning, other dental procedures, or any surgery involving the respiratory, urinary, gastrointestinal tract, gallbladder or prostate gland.   To minimize your chances for develooping endocarditis, maintain good oral health and seek prompt medical attention for any infections involving the mouth, teeth, gums, skin or urinary tract.    Always notify your doctor or dentist about your underlying heart valve condition before having any invasive procedures. You will need to take antibiotics before certain procedures, including all routine dental cleanings or other dental procedures.  Your cardiologist or dentist should prescribe these antibiotics for you to be taken ahead of time.  Make every effort to maintain a "heart-healthy" lifestyle with regular physical exercise and adherence to a low-fat, low-carbohydrate diet.  Continue to seek regular follow-up appointments with your primary care physician and/or cardiologist.   You may continue to gradually increase your physical activity as tolerated.  Refrain from any heavy lifting or strenuous use of your arms and shoulders until at least 8 weeks from the time of your surgery, and avoid activities that cause increased pain in your chest on the side of your surgical incision.  Otherwise you may continue to increase activities without any particular  limitations.  Increase the intensity and duration of physical activity gradually.        

## 2018-02-22 NOTE — Addendum Note (Signed)
Addended by: Vennie Homans on: 02/22/2018 09:47 AM   Modules accepted: Orders

## 2018-02-22 NOTE — Progress Notes (Signed)
HPI:  Patient returns for routine postoperative follow-up having undergone CABG x 2 and MR repair on 01/27/2018.  The patient's early postoperative recovery while in the hospital was as expected and he was discharged home without difficulty. Since hospital discharge the patient reports he doesn't feel that well today.  He states he walked yesterday and then proceeded to pick up sticks for over an hour in his yard.  He is very anxious to get back to his normal gym routine and questions when he can resume weight lifting and start doing push ups.  He continues to use an Oxycodone at night and requests an additional prescription for this.  He remains on coumadin and his dose is being slowly adjusted.   Current Outpatient Medications  Medication Sig Dispense Refill  . aspirin EC 81 MG tablet Take 81 mg by mouth daily.    Marland Kitchen atorvastatin (LIPITOR) 80 MG tablet TAKE 1 TABLET BY MOUTH  DAILY AT 6 PM. 90 tablet 3  . carvedilol (COREG) 3.125 MG tablet Take 1 tablet (3.125 mg total) by mouth 2 (two) times daily with a meal. 60 tablet 1  . levocetirizine (XYZAL) 5 MG tablet Take 5 mg by mouth at bedtime.   3  . levothyroxine (SYNTHROID, LEVOTHROID) 75 MCG tablet TAKE 1 TABLET BY MOUTH  DAILY (Patient taking differently: Take 75 mcg by mouth daily before breakfast. TAKE 1 TABLET BY MOUTH  DAILY) 90 tablet 0  . montelukast (SINGULAIR) 10 MG tablet Take 10 mg by mouth at bedtime.   3  . Multiple Vitamin (MULTIVITAMIN WITH MINERALS) TABS tablet Take 1 tablet by mouth daily.    Marland Kitchen olmesartan (BENICAR) 20 MG tablet Take 0.5 tablets (10 mg total) by mouth daily. 45 tablet 1  . traZODone (DESYREL) 50 MG tablet Take 1 tablet (50 mg total) by mouth at bedtime. (Patient taking differently: Take 25 mg by mouth at bedtime. ) 90 tablet 3  . warfarin (COUMADIN) 5 MG tablet Take 1.5 tablets (7.5 mg total) by mouth daily. Every T,W,TH SAT,SUN  Take 5 mg daily every Monday and Friday 100 tablet 1  . oxyCODONE (OXY IR/ROXICODONE)  5 MG immediate release tablet Take 1 tablet (5 mg total) by mouth every 4 (four) hours as needed for severe pain. 30 tablet 0   No current facility-administered medications for this visit.     Physical Exam:  BP (!) 144/90   Pulse 76   Resp 20   Ht 5\' 11"  (1.803 m)   Wt 182 lb (82.6 kg)   SpO2 98% Comment: RA  BMI 25.38 kg/m   Gen: no apparent distress Heart: RRR Lungs; CTA bilaterally Ext: no edema Incisions: well healed  Diagnostic Tests:  CXR: resolution of previous pneumothorax, no pleural effusions, + cardiomegaly  A/P:  1. S/p CABG x 2, MV Repair- doing very well.  He is hypertensive today, he was started on Benicar at his last cardiology appointment but he has forgotten to take the medication.  He was instructed to please start taking medication as if he remains hypertensive we may need to titrate further 2. Activity- patient is probably doing more than he should.  I stressed the importance of following sternal precautions.  He was instructed that he may not resume any type of push for at least 12 weeks from his date of surgery.  He was given clearance to use a treadmill or stationary bike.  He was also instructed to refrain from any use of weights with upper extremity  for at least 6 weeks.  He does not wish to attend cardiac rehab as he participated in this before and did not feel it was beneficial 3. Pain control- he will be given oxy IR 5 mg 15 tablets, but he was instructed to start using tylenol as Oxy should only be used for severe pain 4. Questionable LUL nodule- patient will need CT chest w/wo contrast prior to next appointment 5. RTC in 3 months with Dr. Geraldo Docker, PA-C [facility] [phone]

## 2018-02-25 ENCOUNTER — Ambulatory Visit (INDEPENDENT_AMBULATORY_CARE_PROVIDER_SITE_OTHER): Payer: 59 | Admitting: Pharmacist

## 2018-02-25 DIAGNOSIS — Z7901 Long term (current) use of anticoagulants: Secondary | ICD-10-CM | POA: Diagnosis not present

## 2018-02-25 DIAGNOSIS — Z953 Presence of xenogenic heart valve: Secondary | ICD-10-CM | POA: Diagnosis not present

## 2018-02-25 LAB — POCT INR: INR: 2.1 (ref 2.0–3.0)

## 2018-03-04 ENCOUNTER — Telehealth: Payer: Self-pay

## 2018-03-04 ENCOUNTER — Telehealth: Payer: Self-pay | Admitting: Internal Medicine

## 2018-03-04 MED ORDER — CARVEDILOL 3.125 MG PO TABS: 3.1250 mg | ORAL_TABLET | Freq: Two times a day (BID) | ORAL | 3 refills | Status: DC

## 2018-03-04 NOTE — Telephone Encounter (Signed)
° ° ° °*  STAT* If patient is at the pharmacy, call can be transferred to refill team.   1. Which medications need to be refilled? (please list name of each medication and dose if known) carvedilol (COREG) 3.125 MG tablet  2. Which pharmacy/location (including street and city if local pharmacy) is medication to be sent to? [place], [place] - [address] AT [address]  3. Do they need a 30 day or 90 day supply? [place]

## 2018-03-04 NOTE — Telephone Encounter (Signed)
Patient contacted the office requesting a refill of his Coreg.  Advised patient to contact his Cardiologist, Dr. Debara Pickett for future refills.  He acknowledged receipt.

## 2018-03-04 NOTE — Telephone Encounter (Signed)
That's fine, I can refill the coreg.  Dr Lemmie Evens

## 2018-03-05 ENCOUNTER — Other Ambulatory Visit: Payer: Self-pay | Admitting: Thoracic Surgery (Cardiothoracic Vascular Surgery)

## 2018-03-05 DIAGNOSIS — R911 Solitary pulmonary nodule: Secondary | ICD-10-CM

## 2018-03-12 ENCOUNTER — Telehealth: Payer: Self-pay | Admitting: Internal Medicine

## 2018-03-12 ENCOUNTER — Ambulatory Visit (INDEPENDENT_AMBULATORY_CARE_PROVIDER_SITE_OTHER): Payer: 59 | Admitting: *Deleted

## 2018-03-12 DIAGNOSIS — Z7901 Long term (current) use of anticoagulants: Secondary | ICD-10-CM

## 2018-03-12 DIAGNOSIS — Z953 Presence of xenogenic heart valve: Secondary | ICD-10-CM

## 2018-03-12 LAB — POCT INR: INR: 2.4 (ref 2.0–3.0)

## 2018-03-12 MED ORDER — WARFARIN SODIUM 5 MG PO TABS: 7.5000 mg | ORAL_TABLET | Freq: Every day | ORAL | 0 refills | Status: DC

## 2018-03-12 NOTE — Telephone Encounter (Signed)
New message     1. Has your device fired? Unknown   2. Is you device beeping?no    3. Are you experiencing draining or swelling at device site? No   4. Are you calling to see if we received your device transmission? No   5. Have you passed out? No   Pt stated that he cant sleep and its set on 60 but think it needs to be lowered to 50 .   Please route to [place]

## 2018-03-12 NOTE — Telephone Encounter (Signed)
Spoke w/ pt and informed him that Chanetta Marshall, NP stated that we can see him one day next week. Pt stated that he can come either Monday or Tuesday. He agreed to Tuesday at 9:00

## 2018-03-12 NOTE — Patient Instructions (Signed)
Description   Continue 7.5mg  daily except for 5mg  every Monday and Friday. Repeat INR in 2 weeks.

## 2018-03-15 ENCOUNTER — Telehealth: Payer: Self-pay | Admitting: *Deleted

## 2018-03-15 ENCOUNTER — Encounter: Payer: Self-pay | Admitting: Cardiology

## 2018-03-15 DIAGNOSIS — Z01812 Encounter for preprocedural laboratory examination: Secondary | ICD-10-CM

## 2018-03-15 NOTE — Telephone Encounter (Signed)
Copied from [place] [id number]. Topic: Appointment Scheduling - Scheduling Inquiry for Clinic >> Mar 15, 2018  3:13 PM Lennox Solders wrote: Reason for CRM: pt said dr Jeneen Rinks allred emailed  dr fry and dr fry said he would accept him as toc from dr k. The banner has been changed however I do not see  phone note. Please confirm    Dr Sarajane Jews, please advise for documentation purposes. Thanks.

## 2018-03-16 ENCOUNTER — Ambulatory Visit (INDEPENDENT_AMBULATORY_CARE_PROVIDER_SITE_OTHER): Payer: 59 | Admitting: Nurse Practitioner

## 2018-03-16 VITALS — BP 152/86 | HR 64 | Ht 71.0 in | Wt 183.0 lb

## 2018-03-16 DIAGNOSIS — I2510 Atherosclerotic heart disease of native coronary artery without angina pectoris: Secondary | ICD-10-CM

## 2018-03-16 DIAGNOSIS — Z953 Presence of xenogenic heart valve: Secondary | ICD-10-CM | POA: Diagnosis not present

## 2018-03-16 DIAGNOSIS — I4819 Other persistent atrial fibrillation: Secondary | ICD-10-CM

## 2018-03-16 DIAGNOSIS — I442 Atrioventricular block, complete: Secondary | ICD-10-CM

## 2018-03-16 NOTE — Progress Notes (Signed)
Electrophysiology Office Note Date: 03/16/2018  ID:  Tyler Hendrix, Tyler Hendrix Mar 21, 1955, MRN [id number]  PCP: [name], MD Primary Cardiologist: [place] Electrophysiologist: Allred  CC: Pacemaker follow-up  Tyler Hendrix is a 63 y.o. male seen today for Dr Johney Frame. At last visit with Dr Johney Frame, he was having occasional shortness of breath. Echo demonstrated severe MR.  He was referred to Dr Cornelius Moras and underwent CABG x2 with MV replacement.  Post procedure, his base rate was changed to 60bpm. He presents today for routine electrophysiology followup.  Since last being seen in our clinic, the patient reports increasing fatigue, exercise intolerance, shortness of breath with exertion.   He denies chest pain, palpitations, nausea, vomiting, dizziness, syncope, edema, weight gain, or early satiety.  Device History: MDT dual chamber PPM implanted 2017 for complete heart block   Past Medical History:  Diagnosis Date  . Abnormal TSH   . Arthritis    ddd  . CAD in native artery    a. 11/2015: s/p 2 vessel PCI of the first diagonal branch of the LAD in the mid left circumflex with DES.  Marland Kitchen Complete heart block (HCC)    a. s/p Medtronic ppm 10/2015.  Marland Kitchen Coronary artery disease involving native coronary artery of native heart without angina pectoris   . Dyspnea    d/t current heart problem  . History of radiation therapy 2002  . Hodgkin disease (HCC) 2002   a. s/p chest radiation and chemotherapy  . Hypothyroidism   . Incidental pulmonary nodule 01/19/2018   Left apical opacity - possible scarring  . Mitral regurgitation   . Pacemaker 10/2015   Medtronic  . PVC's (premature ventricular contractions)   . Right bundle branch block   . 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 S/P mitral valve replacement with bioprosthetic valve 01/27/2018   29 mm [facility] Mitral stented bovine pericardial tissue valve   Past Surgical History:  Procedure Laterality  Date  . CARDIAC CATHETERIZATION N/A 11/02/2015   Procedure: Left Heart Cath and Coronary Angiography;  Surgeon: Tonny Bollman, MD;  Location: [facility] INVASIVE CV LAB;  Service: Cardiovascular;  Laterality: N/A;  . CARDIAC CATHETERIZATION N/A 11/21/2015   Procedure: Coronary Stent Intervention;  Surgeon: Tonny Bollman, MD;  Location: [facility] INVASIVE CV LAB;  Service: Cardiovascular;  Laterality: N/A;  . CARDIAC CATHETERIZATION  11/2017  . CORONARY ARTERY BYPASS GRAFT N/A 01/27/2018   Procedure: CORONARY ARTERY BYPASS GRAFTING (CABG) x two, using left internal mammary artery and right leg greater saphenous vein harvested endoscopically;  Surgeon: Purcell Nails, MD;  Location: [facility] OR;  Service: Open Heart Surgery;  Laterality: N/A;  . EP IMPLANTABLE DEVICE N/A 11/02/2015   MDT Adivisa MRI conditional dual chamber pacemaker implanted by Dr Johney Frame for complete heart block  . KNEE SURGERY Right 1972  . LUMBAR LAMINECTOMY  1996  . MITRAL VALVE REPLACEMENT N/A 01/27/2018   Procedure: MITRAL VALVE (MV) REPLACEMENT using Magna Mitral Ease Valve Size ;  Surgeon: Purcell Nails, MD;  Location: [facility] OR;  Service: Open Heart Surgery;  Laterality: N/A;  . RIGHT/LEFT HEART CATH AND CORONARY ANGIOGRAPHY N/A 12/02/2017   Procedure: RIGHT/LEFT HEART CATH AND CORONARY ANGIOGRAPHY;  Surgeon: Corky Crafts, MD;  Location: [facility] INVASIVE CV LAB;  Service: Cardiovascular;  Laterality: N/A;  . TEE WITHOUT CARDIOVERSION N/A 12/09/2017   Procedure: TRANSESOPHAGEAL ECHOCARDIOGRAM (TEE);  Surgeon: Jodelle Red, MD;  Location[facility] ENDOSCOPY;  Service: Cardiovascular;  Laterality: N/A;  . TEE WITHOUT CARDIOVERSION N/A 01/27/2018   Procedure: TRANSESOPHAGEAL ECHOCARDIOGRAM (TEE);  Surgeon: Purcell Nails, MD;  Location[facility] OR;  Service: Open Heart Surgery;  Laterality: N/A;    Current Outpatient Medications  Medication Sig Dispense Refill  . aspirin EC 81 MG tablet Take 81 mg by mouth daily.    Marland Kitchen atorvastatin  (LIPITOR) 80 MG tablet TAKE 1 TABLET BY MOUTH  DAILY AT 6 PM. 90 tablet 3  . carvedilol (COREG) 3.125 MG tablet Take 1 tablet (3.125 mg total) by mouth 2 (two) times daily with a meal. 180 tablet 3  . levocetirizine (XYZAL) 5 MG tablet Take 5 mg by mouth at bedtime.   3  . levothyroxine (SYNTHROID, LEVOTHROID) 75 MCG tablet TAKE 1 TABLET BY MOUTH  DAILY (Patient taking differently: Take 75 mcg by mouth daily before breakfast. TAKE 1 TABLET BY MOUTH  DAILY) 90 tablet 0  . montelukast (SINGULAIR) 10 MG tablet Take 10 mg by mouth at bedtime.   3  . Multiple Vitamin (MULTIVITAMIN WITH MINERALS) TABS tablet Take 1 tablet by mouth daily.    Marland Kitchen olmesartan (BENICAR) 20 MG tablet Take 0.5 tablets (10 mg total) by mouth daily. 45 tablet 1  . traZODone (DESYREL) 50 MG tablet Take 1 tablet (50 mg total) by mouth at bedtime. (Patient taking differently: Take 25 mg by mouth at bedtime. ) 90 tablet 3  . warfarin (COUMADIN) 5 MG tablet Take 1.5 tablets (7.5 mg total) by mouth daily. Take 7.5mg  daily except  5 mg on  every Monday and Friday, Or As Directed. 120 tablet 0   No current facility-administered medications for this visit.     Allergies:   Patient has no known allergies.   Social History: Social History   Socioeconomic History  . Marital status: Married    Spouse name: Not on file  . Number of children: 2  . Years of education: Masters  . Highest education level: Not on file  Occupational History  . Occupation: Clinical research associate    Comment: [employer]  Social Needs  . Financial resource strain: Not on file  . Food insecurity:    Worry: Not on file    Inability: Not on file  . Transportation needs:    Medical: Not on file    Non-medical: Not on file  Tobacco Use  . Smoking status: Never Smoker  . Smokeless tobacco: Never Used  Substance and Sexual Activity  . Alcohol use: Yes    Alcohol/week: 5.0 standard drinks    Types: 5 Glasses of wine per week  . Drug use: No  .  Sexual activity: Not on file  Lifestyle  . Physical activity:    Days per week: Not on file    Minutes per session: Not on file  . Stress: Not on file  Relationships  . Social connections:    Talks on phone: Not on file    Gets together: Not on file    Attends religious service: Not on file    Active member of club or organization: Not on file    Attends meetings of clubs or organizations: Not on file    Relationship status: Not on file  . Intimate partner violence:    Fear of current or ex partner: Not on file    Emotionally abused: Not on file    Physically abused: Not on file    Forced sexual activity: Not on file  Other Topics Concern  . 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 Cancer Other        prostate father hx  . Heart disease Mother   . Heart disease Father   . Colon cancer Neg Hx      Review of Systems: All other systems reviewed and are otherwise negative except as noted above.   Physical Exam: VS:  Ht 5\' 11"  (1.803 m)   Wt 183 lb (83 kg)   BMI 25.52 kg/m  , BMI Body mass index is 25.52 kg/m.  GEN- The patient is well appearing, alert and oriented x 3 today.   HEENT: normocephalic, atraumatic; sclera clear, conjunctiva pink; hearing intact; oropharynx clear; neck supple  Lungs- Clear to ausculation bilaterally, normal work of breathing.  No wheezes, rales, rhonchi Heart- Regular rate and rhythm (paced) GI- soft, non-tender, non-distended, bowel sounds present  Extremities- no clubbing, cyanosis, or edema  MS- no significant deformity or atrophy Skin- warm and dry, no rash or lesion; PPM pocket well healed Psych- euthymic mood, full affect Neuro- strength and sensation are intact  PPM Interrogation- reviewed in detail today,  See PACEART report  EKG:  EKG is not ordered today.  Recent Labs: 05/12/2017: TSH 2.50 01/25/2018: ALT 34 01/28/2018:  Magnesium 2.7 02/18/2018: BUN 15; Creatinine, Ser 1.14; Hemoglobin 11.8; Platelets 537; Potassium 4.8; Sodium 138   Wt Readings from Last 3 Encounters:  03/16/18 183 lb (83 kg)  02/22/18 182 lb (82.6 kg)  02/18/18 183 lb 3.2 oz (83.1 kg)     Other studies Reviewed: Additional studies/ records that were reviewed today include: Dr Jenel Lucks office notes, hospital notes   Assessment and Plan:  1.  Complete heart block  Normal PPM function See Pace Art report Base rate decreased to 50 per patient's request  2.  Persistent atrial flutter/fibrillation Appears to be new since CV surgery He is symptomatic with fatigue and shortness of breath INR has been therapeutic since 1/16. Will check INR again today. If therapeutic, will plan DCCV for end of next week. Risks, benefits reviewed with patient today who wishes to proceed.  3.  Severe MR S/p MV replacement  4.  CAD s/p CABG No ischemic symptoms Continue medical therapy    Current medicines are reviewed at length with the patient today.   The patient does not have concerns regarding his medicines.  The following changes were made today:  none  Labs/ tests ordered today include: CBC, BMET, INR No orders of the defined types were placed in this encounter.    Disposition:   Follow up with Dr Johney Frame 3-4 weeks     Signed, Gypsy Balsam, NP 03/16/2018 9:13 AM  [facility] [address] [place] [address] [phone] (office) [phone] (fax)

## 2018-03-16 NOTE — H&P (View-Only) (Signed)
Electrophysiology Office Note Date: 03/16/2018  ID:  Tyler, Hendrix Mar 21, 1955, MRN [id number]  PCP: [name], MD Primary Cardiologist: Hilty Electrophysiologist: Allred  CC: Pacemaker follow-up  Tyler Hendrix is a 63 y.o. male seen today for Dr Johney Frame. At last visit with Dr Johney Frame, he was having occasional shortness of breath. Echo demonstrated severe MR.  He was referred to Dr Cornelius Moras and underwent CABG x2 with MV replacement.  Post procedure, his base rate was changed to 60bpm. He presents today for routine electrophysiology followup.  Since last being seen in our clinic, the patient reports increasing fatigue, exercise intolerance, shortness of breath with exertion.   He denies chest pain, palpitations, nausea, vomiting, dizziness, syncope, edema, weight gain, or early satiety.  Device History: MDT dual chamber PPM implanted 2017 for complete heart block   Past Medical History:  Diagnosis Date  . Abnormal TSH   . Arthritis    ddd  . CAD in native artery    a. 11/2015: s/p 2 vessel PCI of the first diagonal branch of the LAD in the mid left circumflex with DES.  Marland Kitchen Complete heart block (HCC)    a. s/p Medtronic ppm 10/2015.  Marland Kitchen Coronary artery disease involving native coronary artery of native heart without angina pectoris   . Dyspnea    d/t current heart problem  . History of radiation therapy 2002  . Hodgkin disease (HCC) 2002   a. s/p chest radiation and chemotherapy  . Hypothyroidism   . Incidental pulmonary nodule 01/19/2018   Left apical opacity - possible scarring  . Mitral regurgitation   . Pacemaker 10/2015   Medtronic  . PVC's (premature ventricular contractions)   . Right bundle branch block   . 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 S/P mitral valve replacement with bioprosthetic valve 01/27/2018   29 mm [facility] Mitral stented bovine pericardial tissue valve   Past Surgical History:  Procedure Laterality  Date  . CARDIAC CATHETERIZATION N/A 11/02/2015   Procedure: Left Heart Cath and Coronary Angiography;  Surgeon: Tonny Bollman, MD;  Location: [facility] INVASIVE CV LAB;  Service: Cardiovascular;  Laterality: N/A;  . CARDIAC CATHETERIZATION N/A 11/21/2015   Procedure: Coronary Stent Intervention;  Surgeon: Tonny Bollman, MD;  Location: [facility] INVASIVE CV LAB;  Service: Cardiovascular;  Laterality: N/A;  . CARDIAC CATHETERIZATION  11/2017  . CORONARY ARTERY BYPASS GRAFT N/A 01/27/2018   Procedure: CORONARY ARTERY BYPASS GRAFTING (CABG) x two, using left internal mammary artery and right leg greater saphenous vein harvested endoscopically;  Surgeon: Purcell Nails, MD;  Location: [facility] OR;  Service: Open Heart Surgery;  Laterality: N/A;  . EP IMPLANTABLE DEVICE N/A 11/02/2015   MDT Adivisa MRI conditional dual chamber pacemaker implanted by Dr Johney Frame for complete heart block  . KNEE SURGERY Right 1972  . LUMBAR LAMINECTOMY  1996  . MITRAL VALVE REPLACEMENT N/A 01/27/2018   Procedure: MITRAL VALVE (MV) REPLACEMENT using Magna Mitral Ease Valve Size ;  Surgeon: Purcell Nails, MD;  Location: [facility] OR;  Service: Open Heart Surgery;  Laterality: N/A;  . RIGHT/LEFT HEART CATH AND CORONARY ANGIOGRAPHY N/A 12/02/2017   Procedure: RIGHT/LEFT HEART CATH AND CORONARY ANGIOGRAPHY;  Surgeon: Corky Crafts, MD;  Location: [facility] INVASIVE CV LAB;  Service: Cardiovascular;  Laterality: N/A;  . TEE WITHOUT CARDIOVERSION N/A 12/09/2017   Procedure: TRANSESOPHAGEAL ECHOCARDIOGRAM (TEE);  Surgeon: Jodelle Red, MD;  Location: [facility] ENDOSCOPY;  Service: Cardiovascular;  Laterality: N/A;  . TEE WITHOUT CARDIOVERSION N/A 01/27/2018   Procedure: TRANSESOPHAGEAL ECHOCARDIOGRAM (TEE);  Surgeon: Purcell Nails, MD;  Location: [facility] OR;  Service: Open Heart Surgery;  Laterality: N/A;    Current Outpatient Medications  Medication Sig Dispense Refill  . aspirin EC 81 MG tablet Take 81 mg by mouth daily.    Marland Kitchen atorvastatin  (LIPITOR) 80 MG tablet TAKE 1 TABLET BY MOUTH  DAILY AT 6 PM. 90 tablet 3  . carvedilol (COREG) 3.125 MG tablet Take 1 tablet (3.125 mg total) by mouth 2 (two) times daily with a meal. 180 tablet 3  . levocetirizine (XYZAL) 5 MG tablet Take 5 mg by mouth at bedtime.   3  . levothyroxine (SYNTHROID, LEVOTHROID) 75 MCG tablet TAKE 1 TABLET BY MOUTH  DAILY (Patient taking differently: Take 75 mcg by mouth daily before breakfast. TAKE 1 TABLET BY MOUTH  DAILY) 90 tablet 0  . montelukast (SINGULAIR) 10 MG tablet Take 10 mg by mouth at bedtime.   3  . Multiple Vitamin (MULTIVITAMIN WITH MINERALS) TABS tablet Take 1 tablet by mouth daily.    Marland Kitchen olmesartan (BENICAR) 20 MG tablet Take 0.5 tablets (10 mg total) by mouth daily. 45 tablet 1  . traZODone (DESYREL) 50 MG tablet Take 1 tablet (50 mg total) by mouth at bedtime. (Patient taking differently: Take 25 mg by mouth at bedtime. ) 90 tablet 3  . warfarin (COUMADIN) 5 MG tablet Take 1.5 tablets (7.5 mg total) by mouth daily. Take 7.5mg  daily except  5 mg on  every Monday and Friday, Or As Directed. 120 tablet 0   No current facility-administered medications for this visit.     Allergies:   Patient has no known allergies.   Social History: Social History   Socioeconomic History  . Marital status: Married    Spouse name: Not on file  . Number of children: 2  . Years of education: Masters  . Highest education level: Not on file  Occupational History  . Occupation: Clinical research associate    Comment: [employer]  Social Needs  . Financial resource strain: Not on file  . Food insecurity:    Worry: Not on file    Inability: Not on file  . Transportation needs:    Medical: Not on file    Non-medical: Not on file  Tobacco Use  . Smoking status: Never Smoker  . Smokeless tobacco: Never Used  Substance and Sexual Activity  . Alcohol use: Yes    Alcohol/week: 5.0 standard drinks    Types: 5 Glasses of wine per week  . Drug use: No  .  Sexual activity: Not on file  Lifestyle  . Physical activity:    Days per week: Not on file    Minutes per session: Not on file  . Stress: Not on file  Relationships  . Social connections:    Talks on phone: Not on file    Gets together: Not on file    Attends religious service: Not on file    Active member of club or organization: Not on file    Attends meetings of clubs or organizations: Not on file    Relationship status: Not on file  . Intimate partner violence:    Fear of current or ex partner: Not on file    Emotionally abused: Not on file    Physically abused: Not on file    Forced sexual activity: Not on file  Other Topics Concern  . 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 Cancer Other        prostate father hx  . Heart disease Mother   . Heart disease Father   . Colon cancer Neg Hx      Review of Systems: All other systems reviewed and are otherwise negative except as noted above.   Physical Exam: VS:  Ht 5\' 11"  (1.803 m)   Wt 183 lb (83 kg)   BMI 25.52 kg/m  , BMI Body mass index is 25.52 kg/m.  GEN- The patient is well appearing, alert and oriented x 3 today.   HEENT: normocephalic, atraumatic; sclera clear, conjunctiva pink; hearing intact; oropharynx clear; neck supple  Lungs- Clear to ausculation bilaterally, normal work of breathing.  No wheezes, rales, rhonchi Heart- Regular rate and rhythm (paced) GI- soft, non-tender, non-distended, bowel sounds present  Extremities- no clubbing, cyanosis, or edema  MS- no significant deformity or atrophy Skin- warm and dry, no rash or lesion; PPM pocket well healed Psych- euthymic mood, full affect Neuro- strength and sensation are intact  PPM Interrogation- reviewed in detail today,  See PACEART report  EKG:  EKG is not ordered today.  Recent Labs: 05/12/2017: TSH 2.50 01/25/2018: ALT 34 01/28/2018:  Magnesium 2.7 02/18/2018: BUN 15; Creatinine, Ser 1.14; Hemoglobin 11.8; Platelets 537; Potassium 4.8; Sodium 138   Wt Readings from Last 3 Encounters:  03/16/18 183 lb (83 kg)  02/22/18 182 lb (82.6 kg)  02/18/18 183 lb 3.2 oz (83.1 kg)     Other studies Reviewed: Additional studies/ records that were reviewed today include: Dr Jenel Lucks office notes, hospital notes   Assessment and Plan:  1.  Complete heart block  Normal PPM function See Pace Art report Base rate decreased to 50 per patient's request  2.  Persistent atrial flutter/fibrillation Appears to be new since CV surgery He is symptomatic with fatigue and shortness of breath INR has been therapeutic since 1/16. Will check INR again today. If therapeutic, will plan DCCV for end of next week. Risks, benefits reviewed with patient today who wishes to proceed.  3.  Severe MR S/p MV replacement  4.  CAD s/p CABG No ischemic symptoms Continue medical therapy    Current medicines are reviewed at length with the patient today.   The patient does not have concerns regarding his medicines.  The following changes were made today:  none  Labs/ tests ordered today include: CBC, BMET, INR No orders of the defined types were placed in this encounter.    Disposition:   Follow up with Dr Johney Frame 3-4 weeks     Signed, Gypsy Balsam, NP 03/16/2018 9:13 AM  [facility] [address] [place] [address] [phone] (office) [phone] (fax)

## 2018-03-16 NOTE — Telephone Encounter (Signed)
Dr. Fry please advise. Thanks  

## 2018-03-16 NOTE — Patient Instructions (Signed)
Medication Instructions:  NONE If you need a refill on your cardiac medications before your next appointment, please call your pharmacy.   Lab work:TODAY CBC PT/INR BMET If you have labs (blood work) drawn today and your tests are completely normal, you will receive your results only by: Marland Kitchen MyChart Message (if you have MyChart) OR . A paper copy in the mail If you have any lab test that is abnormal or we need to change your treatment, we will call you to review the results.  Testing/Procedures: Your physician has recommended that you have a Cardioversion (DCCV). Electrical Cardioversion uses a jolt of electricity to your heart either through paddles or wired patches attached to your chest. This is a controlled, usually prescheduled, procedure. Defibrillation is done under light anesthesia in the hospital, and you usually go home the day of the procedure. This is done to get your heart back into a normal rhythm. You are not awake for the procedure. Please see the instruction sheet given to you today.    Follow-Up: Dr Rayann Heman 3-4 WEEKS  At [facility], you and your health needs are our priority.  As part of our continuing mission to provide you with exceptional heart care, we have created designated Provider Care Teams.  These Care Teams include your primary Cardiologist (physician) and Advanced Practice Providers (APPs -  Physician Assistants and Nurse Practitioners) who all work together to provide you with the care you need, when you need it. .   Any Other Special Instructions Will Be Listed Below (If Applicable). Dear  Tyler Hendrix You are scheduled for a Cardioversion on 03/26/2018 with Dr. Margaretann Loveless.  Please arrive at the [facility] (Main Entrance A) at [facility]: [address] [place], [place] [address] at 10:00 . (1 hour prior to procedure unless lab work is needed; if lab work is needed arrive 1.5 hours ahead)  DIET: Nothing to eat or drink after midnight except a sip of  water with medications (see medication instructions below)   Labs: Wednesday 2/12  CBC PT/INR BMET You must have a responsible person to drive you home and stay in the waiting area during your procedure. Failure to do so could result in cancellation.  Bring your insurance cards.  *Special Note: Every effort is made to have your procedure done on time. Occasionally there are emergencies that occur at the hospital that may cause delays. Please be patient if a delay does occur.

## 2018-03-17 ENCOUNTER — Telehealth: Payer: Self-pay

## 2018-03-17 LAB — BASIC METABOLIC PANEL
BUN/Creatinine Ratio: 14 (ref 10–24)
BUN: 15 mg/dL (ref 8–27)
CO2: 20 mmol/L (ref 20–29)
Calcium: 9.8 mg/dL (ref 8.6–10.2)
Chloride: 101 mmol/L (ref 96–106)
Creatinine, Ser: 1.08 mg/dL (ref 0.76–1.27)
GFR calc Af Amer: 85 mL/min/{1.73_m2} (ref >59)
GFR calc non Af Amer: 74 mL/min/{1.73_m2} (ref >59)
Glucose: 127 mg/dL — ABNORMAL HIGH (ref 65–99)
Potassium: 5 mmol/L (ref 3.5–5.2)
SODIUM: 137 mmol/L (ref 134–144)

## 2018-03-17 LAB — CBC
Hematocrit: 40.3 % (ref 37.5–51.0)
Hemoglobin: 13.4 g/dL (ref 13.0–17.7)
MCH: 28.5 pg (ref 26.6–33.0)
MCHC: 33.3 g/dL (ref 31.5–35.7)
MCV: 86 fL (ref 79–97)
Platelets: 426 10*3/uL (ref 150–450)
RBC: 4.7 x10E6/uL (ref 4.14–5.80)
RDW: 14.2 % (ref 11.6–15.4)
WBC: 11.2 10*3/uL — ABNORMAL HIGH (ref 3.4–10.8)

## 2018-03-17 LAB — PROTIME-INR
INR: 2.3 — ABNORMAL HIGH (ref 0.8–1.2)
PROTHROMBIN TIME: 22.3 s — AB (ref 9.1–12.0)

## 2018-03-17 NOTE — Telephone Encounter (Signed)
-----   Message from Patsey Berthold, NP sent at 03/17/2018  7:43 AM EST ----- Please notify patient of stable pre-cardioversion labs. Thanks!

## 2018-03-17 NOTE — Telephone Encounter (Signed)
Notes recorded by Frederik Schmidt, RN on 03/17/2018 at 8:54 AM EST Lpm informing him of lab results and told him to call, if questions. ------

## 2018-03-18 NOTE — Telephone Encounter (Signed)
Yes I can see him thanks  

## 2018-03-22 NOTE — Telephone Encounter (Signed)
Called and spoke with pt and he is aware that Dr. Sarajane Jews will see him and he will call next week to schedule an appt.

## 2018-03-24 ENCOUNTER — Other Ambulatory Visit: Payer: 59 | Admitting: *Deleted

## 2018-03-24 ENCOUNTER — Other Ambulatory Visit: Payer: Self-pay | Admitting: Nurse Practitioner

## 2018-03-24 DIAGNOSIS — I4819 Other persistent atrial fibrillation: Secondary | ICD-10-CM

## 2018-03-24 DIAGNOSIS — Z01812 Encounter for preprocedural laboratory examination: Secondary | ICD-10-CM

## 2018-03-24 LAB — CBC WITH DIFFERENTIAL/PLATELET
Basophils Absolute: 0 10*3/uL (ref 0.0–0.2)
Basos: 0 %
EOS (ABSOLUTE): 0.1 10*3/uL (ref 0.0–0.4)
Eos: 1 %
HEMOGLOBIN: 13.9 g/dL (ref 13.0–17.7)
Hematocrit: 43.5 % (ref 37.5–51.0)
Immature Grans (Abs): 0 10*3/uL (ref 0.0–0.1)
Immature Granulocytes: 0 %
Lymphocytes Absolute: 0.7 10*3/uL (ref 0.7–3.1)
Lymphs: 7 %
MCH: 27.5 pg (ref 26.6–33.0)
MCHC: 32 g/dL (ref 31.5–35.7)
MCV: 86 fL (ref 79–97)
MONOCYTES: 6 %
Monocytes Absolute: 0.6 10*3/uL (ref 0.1–0.9)
Neutrophils Absolute: 7.9 10*3/uL — ABNORMAL HIGH (ref 1.4–7.0)
Neutrophils: 86 %
Platelets: 384 10*3/uL (ref 150–450)
RBC: 5.06 x10E6/uL (ref 4.14–5.80)
RDW: 14.5 % (ref 11.6–15.4)
WBC: 9.4 10*3/uL (ref 3.4–10.8)

## 2018-03-24 LAB — PROTIME-INR
INR: 3 — ABNORMAL HIGH (ref 0.8–1.2)
Prothrombin Time: 28.9 s — ABNORMAL HIGH (ref 9.1–12.0)

## 2018-03-24 LAB — BASIC METABOLIC PANEL
BUN/Creatinine Ratio: 14 (ref 10–24)
BUN: 15 mg/dL (ref 8–27)
CO2: 25 mmol/L (ref 20–29)
Calcium: 9.5 mg/dL (ref 8.6–10.2)
Chloride: 102 mmol/L (ref 96–106)
Creatinine, Ser: 1.09 mg/dL (ref 0.76–1.27)
GFR calc Af Amer: 84 mL/min/{1.73_m2} (ref >59)
GFR calc non Af Amer: 73 mL/min/{1.73_m2} (ref >59)
Glucose: 142 mg/dL — ABNORMAL HIGH (ref 65–99)
Potassium: 4.6 mmol/L (ref 3.5–5.2)
Sodium: 141 mmol/L (ref 134–144)

## 2018-03-26 ENCOUNTER — Encounter (HOSPITAL_COMMUNITY)
Admission: RE | Disposition: A | Discharge status: Home / Self Care | Payer: Self-pay | Source: Home / Self Care | Attending: Internal Medicine

## 2018-03-26 ENCOUNTER — Ambulatory Visit (HOSPITAL_COMMUNITY): Payer: 59 | Admitting: Certified Registered"

## 2018-03-26 ENCOUNTER — Encounter (HOSPITAL_COMMUNITY): Payer: Self-pay | Admitting: Certified Registered"

## 2018-03-26 ENCOUNTER — Other Ambulatory Visit: Payer: Self-pay | Admitting: Nurse Practitioner

## 2018-03-26 ENCOUNTER — Other Ambulatory Visit: Payer: Self-pay

## 2018-03-26 ENCOUNTER — Ambulatory Visit (HOSPITAL_COMMUNITY)
Admission: RE | Admit: 2018-03-26 | Discharge: 2018-03-26 | Disposition: A | Discharge status: Home / Self Care | Payer: 59 | Attending: Internal Medicine | Admitting: Internal Medicine

## 2018-03-26 ENCOUNTER — Telehealth: Payer: Self-pay | Admitting: Internal Medicine

## 2018-03-26 DIAGNOSIS — Z955 Presence of coronary angioplasty implant and graft: Secondary | ICD-10-CM | POA: Diagnosis not present

## 2018-03-26 DIAGNOSIS — Z950 Presence of cardiac pacemaker: Secondary | ICD-10-CM | POA: Diagnosis not present

## 2018-03-26 DIAGNOSIS — I4892 Unspecified atrial flutter: Secondary | ICD-10-CM | POA: Diagnosis not present

## 2018-03-26 DIAGNOSIS — I480 Paroxysmal atrial fibrillation: Secondary | ICD-10-CM | POA: Diagnosis not present

## 2018-03-26 DIAGNOSIS — I4819 Other persistent atrial fibrillation: Secondary | ICD-10-CM | POA: Insufficient documentation

## 2018-03-26 DIAGNOSIS — E039 Hypothyroidism, unspecified: Secondary | ICD-10-CM | POA: Insufficient documentation

## 2018-03-26 DIAGNOSIS — Z952 Presence of prosthetic heart valve: Secondary | ICD-10-CM | POA: Diagnosis not present

## 2018-03-26 DIAGNOSIS — Z7982 Long term (current) use of aspirin: Secondary | ICD-10-CM | POA: Insufficient documentation

## 2018-03-26 DIAGNOSIS — Z79899 Other long term (current) drug therapy: Secondary | ICD-10-CM | POA: Diagnosis not present

## 2018-03-26 DIAGNOSIS — I5043 Acute on chronic combined systolic (congestive) and diastolic (congestive) heart failure: Secondary | ICD-10-CM | POA: Diagnosis not present

## 2018-03-26 DIAGNOSIS — Z951 Presence of aortocoronary bypass graft: Secondary | ICD-10-CM | POA: Insufficient documentation

## 2018-03-26 DIAGNOSIS — Z8571 Personal history of Hodgkin lymphoma: Secondary | ICD-10-CM | POA: Insufficient documentation

## 2018-03-26 DIAGNOSIS — I2510 Atherosclerotic heart disease of native coronary artery without angina pectoris: Secondary | ICD-10-CM | POA: Diagnosis not present

## 2018-03-26 DIAGNOSIS — I442 Atrioventricular block, complete: Secondary | ICD-10-CM | POA: Diagnosis not present

## 2018-03-26 DIAGNOSIS — M1990 Unspecified osteoarthritis, unspecified site: Secondary | ICD-10-CM | POA: Diagnosis not present

## 2018-03-26 DIAGNOSIS — Z7901 Long term (current) use of anticoagulants: Secondary | ICD-10-CM | POA: Diagnosis not present

## 2018-03-26 DIAGNOSIS — I110 Hypertensive heart disease with heart failure: Secondary | ICD-10-CM | POA: Diagnosis not present

## 2018-03-26 DIAGNOSIS — I5021 Acute systolic (congestive) heart failure: Secondary | ICD-10-CM | POA: Diagnosis not present

## 2018-03-26 HISTORY — PX: CARDIOVERSION: SHX1299

## 2018-03-26 SURGERY — CARDIOVERSION
Anesthesia: General

## 2018-03-26 MED ORDER — LIDOCAINE 2% (20 MG/ML) 5 ML SYRINGE
INTRAMUSCULAR | Status: DC | PRN
  Administered 2018-03-26: 60 mg via INTRAVENOUS

## 2018-03-26 MED ORDER — PROPOFOL 10 MG/ML IV BOLUS
INTRAVENOUS | Status: DC | PRN
  Administered 2018-03-26: 80 mg via INTRAVENOUS

## 2018-03-26 NOTE — Transfer of Care (Signed)
Immediate Anesthesia Transfer of Care Note  Patient: Tyler Hendrix  Procedure(s) Performed: CARDIOVERSION (N/A )  Patient Location: Endoscopy Unit  Anesthesia Type:General  Level of Consciousness: awake, alert , oriented and patient cooperative  Airway & Oxygen Therapy: Patient Spontanous Breathing and Patient connected to nasal cannula oxygen  Post-op Assessment: Report given to RN, Post -op Vital signs reviewed and stable and Patient moving all extremities  Post vital signs: Reviewed and stable  Last Vitals:  Vitals Value Taken Time  BP    Temp    Pulse    Resp    SpO2      Last Pain:  Vitals:   03/26/18 1039  TempSrc: Oral  PainSc: 0-No pain         Complications: No apparent anesthesia complications

## 2018-03-26 NOTE — Discharge Instructions (Signed)
Electrical Cardioversion, Care After °This sheet gives you information about how to care for yourself after your procedure. Your health care provider may also give you more specific instructions. If you have problems or questions, contact your health care provider. °What can I expect after the procedure? °After the procedure, it is common to have: °· Some redness on the skin where the shocks were given. °Follow these instructions at home: ° °· Do not drive for 24 hours if you were given a medicine to help you relax (sedative). °· Take over-the-counter and prescription medicines only as told by your health care provider. °· Ask your health care provider how to check your pulse. Check it often. °· Rest for 48 hours after the procedure or as told by your health care provider. °· Avoid or limit your caffeine use as told by your health care provider. °Contact a health care provider if: °· You feel like your heart is beating too quickly or your pulse is not regular. °· You have a serious muscle cramp that does not go away. °Get help right away if: ° °· You have discomfort in your chest. °· You are dizzy or you feel faint. °· You have trouble breathing or you are short of breath. °· Your speech is slurred. °· You have trouble moving an arm or leg on one side of your body. °· Your fingers or toes turn cold or blue. °This information is not intended to replace advice given to you by your health care provider. Make sure you discuss any questions you have with your health care provider. °Document Released: 11/17/2012 Document Revised: 08/31/2015 Document Reviewed: 08/03/2015 °Elsevier Interactive Patient Education © 2019 [employer] Inc[employer] ° °

## 2018-03-26 NOTE — Telephone Encounter (Signed)
  Pt c/o Shortness Of Breath: STAT if SOB developed within the last 24 hours or pt is noticeably SOB on the phone  1. Are you currently SOB (can you hear that pt is SOB on the phone)? slightly  2. How long have you been experiencing SOB? month  3. Are you SOB when sitting or when up moving around? both  4. Are you currently experiencing any other symptoms? Weakness, loss of appetite

## 2018-03-26 NOTE — Anesthesia Postprocedure Evaluation (Signed)
Anesthesia Post Note  Patient: Tyler Hendrix  Procedure(s) Performed: CARDIOVERSION (N/A )     Patient location during evaluation: PACU Anesthesia Type: General Level of consciousness: awake and alert Pain management: pain level controlled Vital Signs Assessment: post-procedure vital signs reviewed and stable Respiratory status: spontaneous breathing, nonlabored ventilation, respiratory function stable and patient connected to nasal cannula oxygen Cardiovascular status: blood pressure returned to baseline and stable Postop Assessment: no apparent nausea or vomiting Anesthetic complications: no    Last Vitals:  Vitals:   03/26/18 1125 03/26/18 1130  BP: (!) 152/90 (!) 172/88  Pulse: (!) 59 61  Resp: (!) 23 (!) 22  Temp:    SpO2: 94% 97%    Last Pain:  Vitals:   03/26/18 1130  TempSrc:   PainSc: 0-No pain                 Ryan P Ellender

## 2018-03-26 NOTE — Interval H&P Note (Signed)
History and Physical Interval Note:  03/26/2018 10:58 AM  Tyler Hendrix  has presented today for surgery, with the diagnosis of a flutter  The various methods of treatment have been discussed with the patient and family. After consideration of risks, benefits and other options for treatment, the patient has consented to  Procedure(s): CARDIOVERSION (N/A) as a surgical intervention .  The patient's history has been reviewed, patient examined, no change in status, stable for surgery.  I have reviewed the patient's chart and labs.  Questions were answered to the patient's satisfaction.     [name]

## 2018-03-26 NOTE — Telephone Encounter (Signed)
Spoke with pt, he reports being SOB for one month now. He had a DCCV today for new atrial fib and now is SOB with little movement and feels he is SOB while talking to me on the phone. He has swelling in the "long bone of the lower leg" all the time. He reports not being able to lie flat without propping on pillows because he is too SOB. He can not tell if he is in atrial fib or not but his heart rate is 54-55 by his bp machine. He reports since 03-02-2018 he has not been able to do much of anything due to SOB. Advised the patient that it sounded like he needed to go to the ER to be evaluated but he refused and made an appointment for Wednesday next week.

## 2018-03-26 NOTE — CV Procedure (Signed)
Procedure: Electrical Cardioversion Indications:  Atrial Fibrillation  Procedure Details:  Consent: Risks of procedure as well as the alternatives and risks of each were explained to the (patient/caregiver).  Consent for procedure obtained.  Time Out: Verified patient identification, verified procedure, site/side was marked, verified correct patient position, special equipment/implants available, medications/allergies/relevent history reviewed, required imaging and test results available. PERFORMED.  Patient placed on cardiac monitor, pulse oximetry, supplemental oxygen as necessary.  Sedation given: propfol per anesthesia Pacer pads placed anterior and posterior chest.  Cardioverted 1 time(s).  Cardioversion with synchronized biphasic 120J shock.  Evaluation: Findings: Post procedure EKG shows: NSR with Atrial pacing Complications: None Patient did tolerate procedure well.  Time Spent Directly with the Patient:  30 minutes   [name] 03/26/2018, 11:07 AM

## 2018-03-26 NOTE — Anesthesia Preprocedure Evaluation (Addendum)
Anesthesia Evaluation  Patient identified by MRN, date of birth, ID band Patient awake    Reviewed: Allergy & Precautions, NPO status , Patient's Chart, lab work & pertinent test results  Airway Mallampati: II  TM Distance: >3 FB Neck ROM: Full    Dental no notable dental hx.    Pulmonary neg pulmonary ROS,    Pulmonary exam normal breath sounds clear to auscultation       Cardiovascular hypertension, Pt. on home beta blockers and Pt. on medications + CAD, + Cardiac Stents and + CABG  + dysrhythmias Atrial Fibrillation  Rhythm:Irregular Rate:Normal     Neuro/Psych negative neurological ROS  negative psych ROS   GI/Hepatic negative GI ROS, Neg liver ROS,   Endo/Other  Hypothyroidism Hodgkin disease s/p chest radiation and chemotherapy  Renal/GU negative Renal ROS     Musculoskeletal negative musculoskeletal ROS (+)   Abdominal   Peds  Hematology HLD   Anesthesia Other Findings a flutter  Reproductive/Obstetrics                            Anesthesia Physical Anesthesia Plan  ASA: III  Anesthesia Plan: General   Post-op Pain Management:    Induction: Intravenous  PONV Risk Score and Plan: 2 and Propofol infusion and Treatment may vary due to age or medical condition  Airway Management Planned: Mask  Additional Equipment:   Intra-op Plan:   Post-operative Plan:   Informed Consent: I have reviewed the patients History and Physical, chart, labs and discussed the procedure including the risks, benefits and alternatives for the proposed anesthesia with the patient or authorized representative who has indicated his/her understanding and acceptance.     Dental advisory given  Plan Discussed with: CRNA  Anesthesia Plan Comments:         Anesthesia Quick Evaluation

## 2018-03-27 ENCOUNTER — Other Ambulatory Visit (HOSPITAL_COMMUNITY): Payer: 59

## 2018-03-27 ENCOUNTER — Encounter (HOSPITAL_COMMUNITY): Payer: Self-pay | Admitting: Physician Assistant

## 2018-03-27 ENCOUNTER — Emergency Department (HOSPITAL_COMMUNITY): Payer: 59

## 2018-03-27 ENCOUNTER — Observation Stay (HOSPITAL_COMMUNITY)
Admission: EM | Admit: 2018-03-27 | Discharge: 2018-03-29 | DRG: 292 | Disposition: A | Discharge status: Home / Self Care | Payer: 59 | Attending: Cardiology | Admitting: Cardiology

## 2018-03-27 ENCOUNTER — Other Ambulatory Visit: Payer: Self-pay

## 2018-03-27 ENCOUNTER — Telehealth: Payer: Self-pay | Admitting: Physician Assistant

## 2018-03-27 DIAGNOSIS — Z9221 Personal history of antineoplastic chemotherapy: Secondary | ICD-10-CM | POA: Diagnosis not present

## 2018-03-27 DIAGNOSIS — Z79899 Other long term (current) drug therapy: Secondary | ICD-10-CM | POA: Diagnosis not present

## 2018-03-27 DIAGNOSIS — Z955 Presence of coronary angioplasty implant and graft: Secondary | ICD-10-CM

## 2018-03-27 DIAGNOSIS — Z8249 Family history of ischemic heart disease and other diseases of the circulatory system: Secondary | ICD-10-CM

## 2018-03-27 DIAGNOSIS — I4819 Other persistent atrial fibrillation: Secondary | ICD-10-CM | POA: Diagnosis not present

## 2018-03-27 DIAGNOSIS — Z8571 Personal history of Hodgkin lymphoma: Secondary | ICD-10-CM | POA: Diagnosis not present

## 2018-03-27 DIAGNOSIS — Z923 Personal history of irradiation: Secondary | ICD-10-CM

## 2018-03-27 DIAGNOSIS — Z7901 Long term (current) use of anticoagulants: Secondary | ICD-10-CM | POA: Diagnosis not present

## 2018-03-27 DIAGNOSIS — R0602 Shortness of breath: Secondary | ICD-10-CM | POA: Diagnosis not present

## 2018-03-27 DIAGNOSIS — I110 Hypertensive heart disease with heart failure: Principal | ICD-10-CM | POA: Diagnosis present

## 2018-03-27 DIAGNOSIS — Z79890 Hormone replacement therapy (postmenopausal): Secondary | ICD-10-CM | POA: Diagnosis not present

## 2018-03-27 DIAGNOSIS — E785 Hyperlipidemia, unspecified: Secondary | ICD-10-CM | POA: Diagnosis not present

## 2018-03-27 DIAGNOSIS — I5043 Acute on chronic combined systolic (congestive) and diastolic (congestive) heart failure: Secondary | ICD-10-CM | POA: Diagnosis not present

## 2018-03-27 DIAGNOSIS — Z950 Presence of cardiac pacemaker: Secondary | ICD-10-CM

## 2018-03-27 DIAGNOSIS — Z7982 Long term (current) use of aspirin: Secondary | ICD-10-CM

## 2018-03-27 DIAGNOSIS — I2510 Atherosclerotic heart disease of native coronary artery without angina pectoris: Secondary | ICD-10-CM | POA: Diagnosis not present

## 2018-03-27 DIAGNOSIS — R0609 Other forms of dyspnea: Secondary | ICD-10-CM

## 2018-03-27 DIAGNOSIS — R0600 Dyspnea, unspecified: Secondary | ICD-10-CM | POA: Diagnosis present

## 2018-03-27 DIAGNOSIS — E039 Hypothyroidism, unspecified: Secondary | ICD-10-CM | POA: Diagnosis not present

## 2018-03-27 DIAGNOSIS — Z953 Presence of xenogenic heart valve: Secondary | ICD-10-CM | POA: Diagnosis not present

## 2018-03-27 DIAGNOSIS — Z951 Presence of aortocoronary bypass graft: Secondary | ICD-10-CM

## 2018-03-27 DIAGNOSIS — I10 Essential (primary) hypertension: Secondary | ICD-10-CM | POA: Diagnosis present

## 2018-03-27 HISTORY — DX: Chronic combined systolic (congestive) and diastolic (congestive) heart failure: I50.42

## 2018-03-27 HISTORY — DX: Unspecified atrial fibrillation: I48.91

## 2018-03-27 HISTORY — DX: Unspecified atrial flutter: I48.92

## 2018-03-27 LAB — CBC
HCT: 45.7 % (ref 39.0–52.0)
Hemoglobin: 13.9 g/dL (ref 13.0–17.0)
MCH: 26.6 pg (ref 26.0–34.0)
MCHC: 30.4 g/dL (ref 30.0–36.0)
MCV: 87.4 fL (ref 80.0–100.0)
PLATELETS: 357 10*3/uL (ref 150–400)
RBC: 5.23 MIL/uL (ref 4.22–5.81)
RDW: 15.1 % (ref 11.5–15.5)
WBC: 9.7 10*3/uL (ref 4.0–10.5)
nRBC: 0 % (ref 0.0–0.2)

## 2018-03-27 LAB — TSH: TSH: 4.63 u[IU]/mL — ABNORMAL HIGH (ref 0.350–4.500)

## 2018-03-27 LAB — BASIC METABOLIC PANEL
Anion gap: 7 (ref 5–15)
BUN: 16 mg/dL (ref 8–23)
CO2: 26 mmol/L (ref 22–32)
Calcium: 9.1 mg/dL (ref 8.9–10.3)
Chloride: 104 mmol/L (ref 98–111)
Creatinine, Ser: 1.02 mg/dL (ref 0.61–1.24)
GFR calc Af Amer: >60 mL/min (ref >60)
GFR calc non Af Amer: >60 mL/min (ref >60)
Glucose, Bld: 111 mg/dL — ABNORMAL HIGH (ref 70–99)
POTASSIUM: 4.4 mmol/L (ref 3.5–5.1)
Sodium: 137 mmol/L (ref 135–145)

## 2018-03-27 LAB — BRAIN NATRIURETIC PEPTIDE: B NATRIURETIC PEPTIDE 5: 1710.6 pg/mL — AB (ref 0.0–100.0)

## 2018-03-27 LAB — PROTIME-INR
INR: 3.03
Prothrombin Time: 31 seconds — ABNORMAL HIGH (ref 11.4–15.2)

## 2018-03-27 LAB — MAGNESIUM: Magnesium: 2.2 mg/dL (ref 1.7–2.4)

## 2018-03-27 MED ORDER — LEVOCETIRIZINE DIHYDROCHLORIDE 5 MG PO TABS: 5.0000 mg | ORAL_TABLET | Freq: Every day | ORAL | Status: DC

## 2018-03-27 MED ORDER — FUROSEMIDE 10 MG/ML IJ SOLN
80.0000 mg | Freq: Two times a day (BID) | INTRAMUSCULAR | Status: DC
  Administered 2018-03-27 – 2018-03-28 (×2): 80 mg via INTRAVENOUS
  Filled 2018-03-27 (×2): qty 8

## 2018-03-27 MED ORDER — CARVEDILOL 3.125 MG PO TABS
3.1250 mg | ORAL_TABLET | Freq: Two times a day (BID) | ORAL | Status: DC
  Administered 2018-03-27 – 2018-03-28 (×2): 3.125 mg via ORAL
  Filled 2018-03-27 (×2): qty 1

## 2018-03-27 MED ORDER — SODIUM CHLORIDE 0.9 % IV SOLN: 250.0000 mL | INTRAVENOUS | Status: DC | PRN

## 2018-03-27 MED ORDER — ACETAMINOPHEN 325 MG PO TABS: 650.0000 mg | ORAL_TABLET | ORAL | Status: DC | PRN

## 2018-03-27 MED ORDER — LEVOTHYROXINE SODIUM 75 MCG PO TABS
75.0000 ug | ORAL_TABLET | Freq: Every day | ORAL | Status: DC
  Administered 2018-03-28 – 2018-03-29 (×2): 75 ug via ORAL
  Filled 2018-03-27 (×2): qty 1

## 2018-03-27 MED ORDER — WARFARIN - PHARMACIST DOSING INPATIENT
Freq: Every day | Status: DC
  Administered 2018-03-28: 7.5

## 2018-03-27 MED ORDER — MONTELUKAST SODIUM 10 MG PO TABS: 10.0000 mg | ORAL_TABLET | Freq: Every day | ORAL | Status: DC

## 2018-03-27 MED ORDER — ADULT MULTIVITAMIN W/MINERALS CH
1.0000 | ORAL_TABLET | Freq: Every day | ORAL | Status: DC
  Administered 2018-03-28 – 2018-03-29 (×2): 1 via ORAL
  Filled 2018-03-27 (×2): qty 1

## 2018-03-27 MED ORDER — HYDROXYZINE HCL 25 MG PO TABS
25.0000 mg | ORAL_TABLET | Freq: Three times a day (TID) | ORAL | Status: DC | PRN
  Administered 2018-03-27 – 2018-03-28 (×3): 25 mg via ORAL
  Filled 2018-03-27 (×3): qty 1

## 2018-03-27 MED ORDER — ATORVASTATIN CALCIUM 80 MG PO TABS
80.0000 mg | ORAL_TABLET | Freq: Every day | ORAL | Status: DC
  Administered 2018-03-27 – 2018-03-28 (×2): 80 mg via ORAL
  Filled 2018-03-27 (×2): qty 1

## 2018-03-27 MED ORDER — FUROSEMIDE 10 MG/ML IJ SOLN
80.0000 mg | Freq: Once | INTRAMUSCULAR | Status: AC
  Administered 2018-03-27: 80 mg via INTRAVENOUS
  Filled 2018-03-27: qty 8

## 2018-03-27 MED ORDER — ASPIRIN EC 81 MG PO TBEC
81.0000 mg | DELAYED_RELEASE_TABLET | Freq: Every day | ORAL | Status: DC
  Administered 2018-03-28 – 2018-03-29 (×2): 81 mg via ORAL
  Filled 2018-03-27 (×2): qty 1

## 2018-03-27 MED ORDER — LORATADINE 10 MG PO TABS: 10.0000 mg | ORAL_TABLET | Freq: Every day | ORAL | Status: DC

## 2018-03-27 MED ORDER — METOPROLOL TARTRATE 5 MG/5ML IV SOLN
5.0000 mg | INTRAVENOUS | Status: DC | PRN
  Administered 2018-03-27: 5 mg via INTRAVENOUS
  Filled 2018-03-27: qty 5

## 2018-03-27 MED ORDER — TRAZODONE HCL 50 MG PO TABS
50.0000 mg | ORAL_TABLET | Freq: Every day | ORAL | Status: DC
  Administered 2018-03-27 – 2018-03-28 (×2): 50 mg via ORAL
  Filled 2018-03-27 (×2): qty 1

## 2018-03-27 MED ORDER — FUROSEMIDE 10 MG/ML IJ SOLN: 40.0000 mg | Freq: Once | INTRAMUSCULAR | Status: DC

## 2018-03-27 MED ORDER — ONDANSETRON HCL 4 MG/2ML IJ SOLN: 4.0000 mg | Freq: Four times a day (QID) | INTRAMUSCULAR | Status: DC | PRN

## 2018-03-27 MED ORDER — SODIUM CHLORIDE 0.9% FLUSH
3.0000 mL | Freq: Two times a day (BID) | INTRAVENOUS | Status: DC
  Administered 2018-03-27 – 2018-03-29 (×4): 3 mL via INTRAVENOUS

## 2018-03-27 MED ORDER — WARFARIN SODIUM 5 MG PO TABS: 5.0000 mg | ORAL_TABLET | ORAL | Status: DC

## 2018-03-27 MED ORDER — POTASSIUM CHLORIDE CRYS ER 20 MEQ PO TBCR
20.0000 meq | EXTENDED_RELEASE_TABLET | Freq: Two times a day (BID) | ORAL | Status: DC
  Administered 2018-03-27 – 2018-03-29 (×4): 20 meq via ORAL
  Filled 2018-03-27 (×4): qty 1

## 2018-03-27 MED ORDER — SODIUM CHLORIDE 0.9% FLUSH: 3.0000 mL | INTRAVENOUS | Status: DC | PRN

## 2018-03-27 MED ORDER — SACUBITRIL-VALSARTAN 24-26 MG PO TABS
1.0000 | ORAL_TABLET | Freq: Two times a day (BID) | ORAL | Status: DC
  Administered 2018-03-27 – 2018-03-29 (×5): 1 via ORAL
  Filled 2018-03-27 (×4): qty 1

## 2018-03-27 NOTE — ED Notes (Signed)
Portable cxr at bedside

## 2018-03-27 NOTE — Progress Notes (Signed)
Patient is alert and oriented and concerned with HR being elevated Called Cardiac on called for review plan to give medications for anxious.

## 2018-03-27 NOTE — ED Triage Notes (Addendum)
Pt here for constant shob x 1 month with worsening yesterday after cardioversion, also states he is pacemaker dependant at 50 bpm but his heart rate was 115 last around 6 pm last night and elevated again this morning. Hx CABG and mitral valve replacement last month.

## 2018-03-27 NOTE — ED Provider Notes (Signed)
[facility] EMERGENCY DEPARTMENT Provider Note   CSN: [id number] Arrival date & time: 03/27/18  1145     History   Chief Complaint Chief Complaint  Patient presents with  . Shortness of Breath    HPI Tyler Hendrix is a 63 y.o. male with history of severe MVR status post replacement, hypothyroidism on levothyroxine, CAD status post CABG x2, complete heart block status post pacemaker on Coumadin, HF EF 40%, Hodgkin's disease status post radiation and chemotherapy, recent cardioversion of persistent atrial flutter/fibrillation yesterday is here for evaluation of palpitations.  Sudden onset around 6 PM yesterday.  He checked his heart rate and it was in the 100s to 115's range.  Prior to palpitations he had worsening shortness of breath.  States that he has been having shortness of breath, fatigue, exercise intolerance, orthopnea for over 1 month.  He has had intermittent dry cough as well for several weeks. Went to see cardiology on 2/4 and was noted to be in new persistent atrial flutter/fibrillation and was scheduled for cardioversion yesterday.  After the procedure his heart rate was in the 50 to 60s range, he felt like his shortness of breath had improved but symptoms returned a couple of hours after he arrived home around 4 PM.  Has been compliant with Coumadin and levothyroxine.  He denies any fevers, chest pain, nausea, vomiting, diaphoresis, lightheadedness.  HPI  Past Medical History:  Diagnosis Date  . Abnormal TSH   . Arthritis    ddd  . Atrial fibrillation and flutter ([place])    s/p DCCV 03/26/2018  . CAD in native artery    a. 11/2015: s/p 2 vessel PCI of the first diagonal branch of the LAD in the mid left circumflex with DES. // s/p CABG 01/2018  . Chronic combined systolic and diastolic CHF (congestive heart failure) ([place])    Echo 10/19 (prior to MVR, CABG):  Mild LVH, anteroseptal and apical hypokinesis, EF 40, grade 2 diastolic dysfunction, MAC,  moderate to severe MR, moderate to severe LAE, normal RV SF, PASP 39, trivial effusion  . Complete heart block (HCC)    a. s/p Medtronic ppm 10/2015.  Marland Kitchen History of radiation therapy 2002  . Hodgkin disease ([place]) 2002   a. s/p chest radiation and chemotherapy  . Hypothyroidism   . Incidental pulmonary nodule 01/19/2018   Left apical opacity - possible scarring  . Mitral regurgitation    s/p bioprosthetic MV replacement 01/2018  . Pacemaker 10/2015   Medtronic  . PVC's (premature ventricular contractions)   . Right bundle branch block   . 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 S/P mitral valve replacement with bioprosthetic valve 01/27/2018   29 mm [facility] Mitral stented bovine pericardial tissue valve    Patient Active Problem List   Diagnosis Date Noted  . Acute on chronic combined systolic (congestive) and diastolic (congestive) heart failure ([place]) 03/27/2018  . Paroxysmal atrial fibrillation (HCC)   . Long term (current) use of anticoagulants 02/05/2018  . S/P mitral valve replacement with bioprosthetic valve + CABG x2 01/27/2018  . S/P CABG x 2 01/27/2018  . Incidental pulmonary nodule 01/19/2018  . Severe mitral regurgitation   . Acute systolic heart failure ([place])   . Status post coronary artery stent placement 10/17/2016  . Hyperlipidemia 04/17/2016  . Hypothyroidism 01/01/2016  . Cardiac pacemaker 01/01/2016  . Exertional dyspnea 11/21/2015  . Coronary artery disease involving  native coronary artery of native heart without angina pectoris   . Complete heart block ([place]) 10/04/2015  . PVCs (premature ventricular contractions) 10/04/2015  . Essential hypertension 06/22/2015  . Junctional bradycardia 11/29/2014  . History of radiation therapy 06/15/2014  . RBBB 10/26/2013  . OTHER ACUTE SINUSITIS 02/09/2007  . History of Hodgkin's disease 11/04/2006    Past Surgical History:  Procedure Laterality Date  . CARDIAC  CATHETERIZATION N/A 11/02/2015   Procedure: Left Heart Cath and Coronary Angiography;  Surgeon: Sherren Mocha, MD;  Location: [facility] CV LAB;  Service: Cardiovascular;  Laterality: N/A;  . CARDIAC CATHETERIZATION N/A 11/21/2015   Procedure: Coronary Stent Intervention;  Surgeon: Sherren Mocha, MD;  Location: [facility] CV LAB;  Service: Cardiovascular;  Laterality: N/A;  . CARDIAC CATHETERIZATION  11/2017  . CORONARY ARTERY BYPASS GRAFT N/A 01/27/2018   Procedure: CORONARY ARTERY BYPASS GRAFTING (CABG) x two, using left internal mammary artery and right leg greater saphenous vein harvested endoscopically;  Surgeon: Rexene Alberts, MD;  Location: [facility];  Service: Open Heart Surgery;  Laterality: N/A;  . EP IMPLANTABLE DEVICE N/A 11/02/2015   MDT Adivisa MRI conditional dual chamber pacemaker implanted by Dr Rayann Heman for complete heart block  . KNEE SURGERY Right 1972  . LUMBAR LAMINECTOMY  1996  . MITRAL VALVE REPLACEMENT N/A 01/27/2018   Procedure: MITRAL VALVE (MV) REPLACEMENT using Magna Mitral Ease Valve Size 29MM;  Surgeon: Rexene Alberts, MD;  Location: [facility];  Service: Open Heart Surgery;  Laterality: N/A;  . RIGHT/LEFT HEART CATH AND CORONARY ANGIOGRAPHY N/A 12/02/2017   Procedure: RIGHT/LEFT HEART CATH AND CORONARY ANGIOGRAPHY;  Surgeon: Jettie Booze, MD;  Location: [facility] CV LAB;  Service: Cardiovascular;  Laterality: N/A;  . TEE WITHOUT CARDIOVERSION N/A 12/09/2017   Procedure: TRANSESOPHAGEAL ECHOCARDIOGRAM (TEE);  Surgeon: Buford Dresser, MD;  [facility] END[facility];  Service: Cardiovascular;  Laterality: N/A;  . TEE WITHOUT CARDIOVERSION N/A 01/27/2018   Procedure: TRANSESOPHAGEAL ECHOCARDIOGRAM (TEE);  Surgeon: Rexene Alberts, MD;  Location: [facility];  Service: Open Heart Surgery;  Laterality: N/A;        Home Medications    Prior to Admission medications   Medication Sig Start Date End Date Taking? Authorizing Provider  aspirin EC 81 MG tablet  Take 81 mg by mouth daily.   Yes [provider]  atorvastatin (LIPITOR) 80 MG tablet TAKE 1 TABLET BY MOUTH  DAILY AT 6 PM. Patient taking differently: Take 80 mg by mouth daily at 6 PM.  02/08/18  Yes Hilty, Nadean Corwin, MD  carvedilol (COREG) 3.125 MG tablet Take 1 tablet (3.125 mg total) by mouth 2 (two) times daily with a meal. 03/04/18  Yes Hilty, Nadean Corwin, MD  levocetirizine (XYZAL) 5 MG tablet Take 5 mg by mouth at bedtime.  10/23/17  Yes [provider]  levothyroxine (SYNTHROID, LEVOTHROID) 75 MCG tablet TAKE 1 TABLET BY MOUTH  DAILY Patient taking differently: Take 75 mcg by mouth daily before breakfast.  11/18/17  Yes Philemon Kingdom, MD  montelukast (SINGULAIR) 10 MG tablet Take 10 mg by mouth at bedtime.  10/23/17  Yes [provider]  Multiple Vitamin (MULTIVITAMIN WITH MINERALS) TABS tablet Take 1 tablet by mouth daily.   Yes [provider]  traZODone (DESYREL) 50 MG tablet Take 1 tablet (50 mg total) by mouth at bedtime. 05/12/17  Yes Marletta Lor, MD  warfarin (COUMADIN) 5 MG tablet Take 1.5 tablets (7.5 mg total) by mouth daily. Take 7.73m daily except  5 mg on  every Monday and Friday, Or As Directed. Patient taking differently: Take 5-7.5 mg by mouth See admin instructions. Take 7.72m daily except  5 mg on  every Monday and Friday 03/12/18  Yes Hilty, KNadean Corwin MD    Family History Family History  Problem Relation Age of Onset  . Cancer Other        prostate father hx  . Heart disease Mother   . Heart disease Father   . Colon cancer Neg Hx     Social History Social History   Tobacco Use  . Smoking status: Never Smoker  . Smokeless tobacco: Never Used  Substance Use Topics  . Alcohol use: Yes    Alcohol/week: 5.0 standard drinks    Types: 5 Glasses of wine per week  . Drug use: No     Allergies   Other   Review of Systems Review of Systems  Respiratory: Positive for cough and shortness of breath.     Cardiovascular: Positive for palpitations.  Hematological: Bruises/bleeds easily.  All other systems reviewed and are negative.    Physical Exam Updated Vital Signs BP (!) 157/93   Pulse 61   Temp 97.9 F (36.6 C) (Oral)   Resp 20   Ht _0  (1.803 m)   Wt 78.8 kg   SpO2 97%   BMI 24.24 kg/m   Physical Exam Constitutional:      Appearance: He is well-developed.     Comments: NAD. Non toxic.   HENT:     Head: Normocephalic and atraumatic.     Nose: Nose normal.  Eyes:     General: Lids are normal.     Conjunctiva/sclera: Conjunctivae normal.  Neck:     Musculoskeletal: Normal range of motion.     Trachea: Trachea normal.     Comments: Trachea midline.  Cardiovascular:     Rate and Rhythm: Normal rate and regular rhythm.     Pulses:          Radial pulses are 2+ on the right side and 2+ on the left side.       Dorsalis pedis pulses are 2+ on the right side and 2+ on the left side.     Heart sounds: Normal heart sounds, S1 normal and S2 normal.     Comments: No LE edema or calf tenderness. Paced rhythm HR 105-115.  Pulmonary:     Effort: Pulmonary effort is normal.     Breath sounds: Normal breath sounds.  Abdominal:     General: Bowel sounds are normal.     Palpations: Abdomen is soft.     Tenderness: There is no abdominal tenderness.     Comments: No epigastric tenderness. No distention.   Skin:    General: Skin is warm and dry.     Capillary Refill: Capillary refill takes less than 2 seconds.     Comments: No rash to chest wall  Neurological:     Mental Status: He is alert.     GCS: GCS eye subscore is 4. GCS verbal subscore is 5. GCS motor subscore is 6.  Psychiatric:        Speech: Speech normal.        Behavior: Behavior normal.        Thought Content: Thought content normal.      ED Treatments / Results  Labs (all labs ordered are listed, but only abnormal results are displayed) Labs Reviewed  BASIC METABOLIC PANEL - Abnormal; Notable for  the  following components:      Result Value   Glucose, Bld 111 (*)    All other components within normal limits  TSH - Abnormal; Notable for the following components:   TSH 4.630 (*)    All other components within normal limits  BRAIN NATRIURETIC PEPTIDE - Abnormal; Notable for the following components:   B Natriuretic Peptide 1,710.6 (*)    All other components within normal limits  PROTIME-INR - Abnormal; Notable for the following components:   Prothrombin Time 31.0 (*)    All other components within normal limits  MAGNESIUM  CBC    EKG EKG Interpretation  Date/Time:  Saturday March 27 2018 11:54:21 EST Ventricular Rate:  108 PR Interval:    QRS Duration: 167 QT Interval:  472 QTC Calculation: 633 R Axis:   -80 Text Interpretation:  Ventricular-paced rhythm No further analysis attempted due to paced rhythm Since prior ECG< rate has increased Confirmed by Gareth Morgan [phone]) on 03/27/2018 12:13:29 PM   Radiology Dg Chest Port 1 View  Result Date: 03/27/2018 CLINICAL DATA:  Palpitations. Shortness of breath. EXAM: PORTABLE CHEST 1 VIEW COMPARISON:  02/22/2018 FINDINGS: Sequelae of mitral valve replacement and CABG are again identified. A dual chamber pacemaker remains in place. The cardiac silhouette remains enlarged. There are small bilateral pleural effusions which are larger than on the prior study. Left basilar airspace opacity is new. There is also mild pulmonary vascular congestion and new mild diffuse prominence of the interstitial markings. No pneumothorax is identified. No acute osseous abnormality is seen. IMPRESSION: 1. Cardiomegaly with small bilateral pleural effusions and suspected mild interstitial edema. 2.  Left basilar atelectasis versus pneumonia. Electronically Signed   By: Logan Bores M.D.   On: 03/27/2018 13:42    Procedures .Critical Care Performed by: Kinnie Feil, PA-C Authorized by: Kinnie Feil, PA-C   Critical care provider  statement:    Critical care time (minutes):  45   Critical care was necessary to treat or prevent imminent or life-threatening deterioration of the following conditions:  Circulatory failure (HF exacerbation)   Critical care was time spent personally by me on the following activities:  Discussions with consultants, evaluation of patient's response to treatment, examination of patient, ordering and performing treatments and interventions, ordering and review of laboratory studies, ordering and review of radiographic studies, pulse oximetry, re-evaluation of patient's condition, obtaining history from patient or surrogate and review of old charts   I assumed direction of critical care for this patient from another provider in my specialty: no     (including critical care time)  Medications Ordered in ED Medications  Warfarin - Pharmacist Dosing Inpatient (has no administration in time range)  furosemide (LASIX) injection 80 mg (80 mg Intravenous Given 03/27/18 1429)     Initial Impression / Assessment and Plan / ED Course  I have reviewed the triage vital signs and the nursing notes.  Pertinent labs & imaging results that were available during my care of the patient were reviewed by me and considered in my medical decision making (see chart for details).  Clinical Course as of Mar 27 1518  Sat Mar 27, 2018  1301 HR 85-88 after metoprolol, pt denies palpitations rn. Made aware Dr [place]/cards will see   [CG]  1334 B Natriuretic Peptide(!): 1,710.6 [CG]  1344 TSH(!): 4.630 [CG]  1348 IMPRESSION: 1. Cardiomegaly with small bilateral pleural effusions and suspected mild interstitial edema. 2. Left basilar atelectasis versus pneumonia.  DG Chest [facility]  1 View [CG]    Clinical Course User Index [CG] Kinnie Feil, PA-C    Possible pt reverted back to atrial fibrillation given palpitations and worsening SOB.  He has been compliant with coumadin and denies CP, pleuritic CP making PE  less likely.  No h/o HF but reports ongoing exertional SOB, orthopnea, fatigue in setting of HF EF 40%. We will obtain cardiac labs including BNP, interrogate pacemaker.  Given recent CV procedure will consult cardiology for further recommendations. HR 100-115 in ER, HD stable. Tachypnic.   0340: Pacemaker interrogated, patient noted to be in A. fib until cardioversion yesterday and normal sinus rhythm since.  BNP is 1700 and chest x-ray shows effusions and interstitial edema which can explain his symptoms.  RN gave one dose of metoprolol but this has been discontinued.  Cardiology PA at bedside, pending recommendations.  Final Clinical Impressions(s) / ED Diagnoses   Final diagnoses:  Acute on chronic combined systolic and diastolic congestive heart failure [facility])    ED Discharge Orders         Ordered    Amb referral to AFIB Clinic     03/27/18 1218           Arlean Hopping 03/27/18 1520    Gareth Morgan, MD 03/27/18 2217

## 2018-03-27 NOTE — Telephone Encounter (Signed)
Tyler Hendrix is a 63 y.o. male with CAD, heart block s/p pacer who underwent CABG and MV replacement in 01/2018.  He developed atrial fibrillation and underwent DCCV yesterday.  He called today with elevated heart rate and shortness of breath.  He is noticeably struggling to talk on the phone due to cough and dyspnea.  He notes he is right outside the ED. PLAN:  I have instructed him to go into the ED now for evaluation.  He agrees. Richardson Dopp, PA-C    03/27/2018 11:47 AM

## 2018-03-27 NOTE — H&P (Addendum)
Cardiology H & P:   Patient ID: ABEM STPAUL MRN: [id number]; DOB: 10-19-1955  Admit date: 03/27/2018 Date of Consult: 03/27/2018  Primary Care Provider: [name], MD Primary Cardiologist: Chrystie Nose, MD   Primary Electrophysiologist:  Hillis Range, MD     Patient Profile:   Tyler Hendrix is a 63 y.o. male with a hx of Hodgkin's disease treated with combined chemotherapy and radiation therapy to the chest in 2002, complete heart block status post permanent pacemaker implantation in 2017, coronary artery disease status post multivessel PCI and stenting in 2017, combined systolic and diastolic heart failure, mitral regurgitation status post bioprosthetic mitral valve replacement and CABG x2 in December 2019, atrial fibrillation status post cardioversion 03/26/2018 who is being seen today for the evaluation of tachycardia and shortness of breath at the request of Dr. Margarette Asal.  History of Present Illness:   Tyler Hendrix underwent CABG x2 and mitral valve replacement with bioprosthetic valve in December 2019.  Postoperative course was fairly uneventful.  However, he followed up 03/16/2018 in the clinic and was noted to be in persistent atrial fibrillation.  He is on Coumadin for anticoagulation.  Elective cardioversion was arranged.  This was performed yesterday with successful conversion to normal sinus rhythm with atrial pacing.  Patient called the answering service today with complaints of rapid heartbeat last night as well as shortness of breath.  He presented to the emergency room.  EKG appears to demonstrate underlying atrial flutter with rapid ventricular rate.  However, interrogation of his device demonstrates normal sinus rhythm since his cardioversion yesterday.  He tells me that he has been short of breath for a month.  He notes orthopnea, paroxysmal nocturnal dyspnea, leg swelling and increased abdominal girth.  He has not had an appetite and has not had any weight gain.  CXR  shows +CHF and his BNP is 1700.    Echo 11/25/2017 Mild LVH, anteroseptal and apical hypokinesis, EF 40, grade 2 diastolic dysfunction, MAC, moderate to severe MR, moderate to severe LAE, normal RV SF, PASP 39, trivial effusion  Past Medical History:  Diagnosis Date  . Abnormal TSH   . Arthritis    ddd  . Atrial fibrillation and flutter (HCC)    s/p DCCV 03/26/2018  . CAD in native artery    a. 11/2015: s/p 2 vessel PCI of the first diagonal branch of the LAD in the mid left circumflex with DES. // s/p CABG 01/2018  . Chronic combined systolic and diastolic CHF (congestive heart failure) (HCC)    Echo 10/19 (prior to MVR, CABG):  Mild LVH, anteroseptal and apical hypokinesis, EF 40, grade 2 diastolic dysfunction, MAC, moderate to severe MR, moderate to severe LAE, normal RV SF, PASP 39, trivial effusion  . Complete heart block (HCC)    a. s/p Medtronic ppm 10/2015.  Marland Kitchen History of radiation therapy 2002  . Hodgkin disease (HCC) 2002   a. s/p chest radiation and chemotherapy  . Hypothyroidism   . Incidental pulmonary nodule 01/19/2018   Left apical opacity - possible scarring  . Mitral regurgitation    s/p bioprosthetic MV replacement 01/2018  . Pacemaker 10/2015   Medtronic  . PVC's (premature ventricular contractions)   . Right bundle branch block   . 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 S/P mitral valve replacement with bioprosthetic valve 01/27/2018   29 mm [facility] Mitral stented bovine pericardial tissue valve  Past Surgical History:  Procedure Laterality Date  . CARDIAC CATHETERIZATION N/A 11/02/2015   Procedure: Left Heart Cath and Coronary Angiography;  Surgeon: Tonny Bollman, MD;  Location: [facility] INVASIVE CV LAB;  Service: Cardiovascular;  Laterality: N/A;  . CARDIAC CATHETERIZATION N/A 11/21/2015   Procedure: Coronary Stent Intervention;  Surgeon: Tonny Bollman, MD;  Location: [facility] INVASIVE CV LAB;  Service: Cardiovascular;   Laterality: N/A;  . CARDIAC CATHETERIZATION  11/2017  . CORONARY ARTERY BYPASS GRAFT N/A 01/27/2018   Procedure: CORONARY ARTERY BYPASS GRAFTING (CABG) x two, using left internal mammary artery and right leg greater saphenous vein harvested endoscopically;  Surgeon: Purcell Nails, MD;  Location[facility] OR;  Service: Open Heart Surgery;  Laterality: N/A;  . EP IMPLANTABLE DEVICE N/A 11/02/2015   MDT Adivisa MRI conditional dual chamber pacemaker implanted by Dr Johney Frame for complete heart block  . KNEE SURGERY Right 1972  . LUMBAR LAMINECTOMY  1996  . MITRAL VALVE REPLACEMENT N/A 01/27/2018   Procedure: MITRAL VALVE (MV) REPLACEMENT using Magna Mitral Ease Valve Size ;  Surgeon: Purcell Nails, MD;  Location: [facility] OR;  Service: Open Heart Surgery;  Laterality: N/A;  . RIGHT/LEFT HEART CATH AND CORONARY ANGIOGRAPHY N/A 12/02/2017   Procedure: RIGHT/LEFT HEART CATH AND CORONARY ANGIOGRAPHY;  Surgeon: Corky Crafts, MD;  Location: [facility] INVASIVE CV LAB;  Service: Cardiovascular;  Laterality: N/A;  . TEE WITHOUT CARDIOVERSION N/A 12/09/2017   Procedure: TRANSESOPHAGEAL ECHOCARDIOGRAM (TEE);  Surgeon: Jodelle Red, MD;  Location: [facility] ENDOSCOPY;  Service: Cardiovascular;  Laterality: N/A;  . TEE WITHOUT CARDIOVERSION N/A 01/27/2018   Procedure: TRANSESOPHAGEAL ECHOCARDIOGRAM (TEE);  Surgeon: Purcell Nails, MD;  Location: [facility] OR;  Service: Open Heart Surgery;  Laterality: N/A;     Home Medications:  Prior to Admission medications   Medication Sig Start Date End Date Taking? Authorizing Provider  aspirin EC 81 MG tablet Take 81 mg by mouth daily.   Yes [provider]  atorvastatin (LIPITOR) 80 MG tablet TAKE 1 TABLET BY MOUTH  DAILY AT 6 PM. Patient taking differently: Take 80 mg by mouth daily at 6 PM.  02/08/18  Yes Hilty, Lisette Abu, MD  carvedilol (COREG) 3.125 MG tablet Take 1 tablet (3.125 mg total) by mouth 2 (two) times daily with a meal. 03/04/18  Yes Hilty, Lisette Abu,  MD  levocetirizine (XYZAL) 5 MG tablet Take 5 mg by mouth at bedtime.  10/23/17  Yes [provider]  levothyroxine (SYNTHROID, LEVOTHROID) 75 MCG tablet TAKE 1 TABLET BY MOUTH  DAILY Patient taking differently: Take 75 mcg by mouth daily before breakfast.  11/18/17  Yes Carlus Pavlov, MD  montelukast (SINGULAIR) 10 MG tablet Take 10 mg by mouth at bedtime.  10/23/17  Yes [provider]  Multiple Vitamin (MULTIVITAMIN WITH MINERALS) TABS tablet Take 1 tablet by mouth daily.   Yes [provider]  olmesartan (BENICAR) 20 MG tablet Take 0.5 tablets (10 mg total) by mouth daily. 02/19/18  Yes Azalee Course, PA  traZODone (DESYREL) 50 MG tablet Take 1 tablet (50 mg total) by mouth at bedtime. 05/12/17  Yes Gordy Savers, MD  warfarin (COUMADIN) 5 MG tablet Take 1.5 tablets (7.5 mg total) by mouth daily. Take 7.5mg  daily except  5 mg on  every Monday and Friday, Or As Directed. Patient taking differently: Take 5-7.5 mg by mouth See admin instructions. Take 7.5mg  daily except  5 mg on  every Monday and Friday 03/12/18  Yes Hilty, Lisette Abu, MD  Inpatient Medications: Scheduled Meds:  Continuous Infusions:  PRN Meds:   Allergies:    Allergies  Allergen Reactions  . Other     Pt reported allergic to dust mice that causes of sneezing, runny nose    Social History:   Social History   Socioeconomic History  . Marital status: Married    Spouse name: Not on file  . Number of children: 2  . Years of education: Masters  . Highest education level: Not on file  Occupational History  . Occupation: Clinical research associate    Comment: [employer]  Social Needs  . Financial resource strain: Not on file  . Food insecurity:    Worry: Not on file    Inability: Not on file  . Transportation needs:    Medical: Not on file    Non-medical: Not on file  Tobacco Use  . Smoking status: Never Smoker  . Smokeless tobacco: Never Used  Substance and Sexual  Activity  . Alcohol use: Yes    Alcohol/week: 5.0 standard drinks    Types: 5 Glasses of wine per week  . Drug use: No  . Sexual activity: Not on file  Lifestyle  . Physical activity:    Days per week: Not on file    Minutes per session: Not on file  . Stress: Not on file  Relationships  . Social connections:    Talks on phone: Not on file    Gets together: Not on file    Attends religious service: Not on file    Active member of club or organization: Not on file    Attends meetings of clubs or organizations: Not on file    Relationship status: Not on file  . Intimate partner violence:    Fear of current or ex partner: Not on file    Emotionally abused: Not on file    Physically abused: Not on file    Forced sexual activity: Not on file  Other Topics Concern  . 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 Cancer Other        prostate father hx  . Heart disease Mother   . Heart disease Father   . Colon cancer Neg Hx      ROS:  Please see the history of present illness.  He has a non productive cough.  He denies melena, hematochezia, fever, rash. All other ROS reviewed and negative.     Physical Exam/Data:   Vitals:   03/27/18 1200 03/27/18 1215 03/27/18 1300 03/27/18 1315  BP: (!) 160/107 (!) 153/103 (!) 156/109 (!) 168/92  Pulse: (!) 105 (!) 101 87 (!) 51  Resp: (!) 38 (!) 29 (!) 26 (!) 25  Temp:      TempSrc:      SpO2: 99% 96% 96% 95%  Weight:      Height:       No intake or output data in the 24 hours ending 03/27/18 1400 Last 3 Weights 03/27/2018 03/16/2018 02/22/2018  Weight (lbs) 173 lb 12.8 oz 183 lb 182 lb  Weight (kg) 78.835 kg 83.008 kg 82.555 kg     Body mass index is 24.24 kg/m.  General:  Well nourished, well developed, in no acute distress  HEENT: normal Lymph: no adenopathy Neck: +JVD JVP 8-9 cm Endocrine:  No thryomegaly Vascular: DP/PT 2+  bilat  Cardiac:  normal S1, S2; RRR; no murmur   Lungs:  Bibasilar rales, no wheezes  Abd: +distention, no obvious hepatomegaly Ext: 1+ bilat LE edema Musculoskeletal:  No deformities Skin: warm and dry  Neuro:  CNs 2-12 intact, no focal abnormalities noted Psych:  Normal affect   EKG:  The EKG was personally reviewed and demonstrates:  Undetermined rhythm, v paced, HR 108   Laboratory Data:  Chemistry Recent Labs  Lab 03/24/18 0919 03/27/18 1232  NA 141 137  K 4.6 4.4  CL 102 104  CO2 25 26  GLUCOSE 142* 111*  BUN 15 16  CREATININE 1.09 1.02  CALCIUM 9.5 9.1  GFRNONAA 73 >60  GFRAA 84 >60  ANIONGAP  --  7     Hematology Recent Labs  Lab 03/24/18 0919 03/27/18 1232  WBC 9.4 9.7  RBC 5.06 5.23  HGB 13.9 13.9  HCT 43.5 45.7  MCV 86 87.4  MCH 27.5 26.6  MCHC 32.0 30.4  RDW 14.5 15.1  PLT 384 357   Cardiac EnzymesNo results for input(s): TROPONINI in the last 168 hours. No results for input(s): TROPIPOC in the last 168 hours.  BNP Recent Labs  Lab 03/27/18 1232  BNP 1,710.6*    DDimer No results for input(s): DDIMER in the last 168 hours.  Radiology/Studies:  Dg Chest Port 1 View  Result Date: 03/27/2018 CLINICAL DATA:  Palpitations. Shortness of breath. EXAM: PORTABLE CHEST 1 VIEW COMPARISON:  02/22/2018 FINDINGS: Sequelae of mitral valve replacement and CABG are again identified. A dual chamber pacemaker remains in place. The cardiac silhouette remains enlarged. There are small bilateral pleural effusions which are larger than on the prior study. Left basilar airspace opacity is new. There is also mild pulmonary vascular congestion and new mild diffuse prominence of the interstitial markings. No pneumothorax is identified. No acute osseous abnormality is seen. IMPRESSION: 1. Cardiomegaly with small bilateral pleural effusions and suspected mild interstitial edema. 2.  Left basilar atelectasis versus pneumonia. Electronically Signed   By: Sebastian Ache M.D.    On: 03/27/2018 13:42    Assessment and Plan:   1. Acute on Chronic Combined Systolic and Diastolic CHF His EF was 40 on echo prior to his surgery.  He has been having symptoms of CHF for the past 1 month.  Interrogation of his device shows he is still in normal sinus rhythm.  - Admit to [facility]  - Lasix 40 mg IV x 1 now  - Lasix 80 mg IV BID for today, reassess tomorrow  - K+ 20 mEq BID  - BMET in AM  - Obtain follow up Echo  - Continue Coreg  - DC Olmesartan  - Start Entresto 24/26 mg BID  - Consider Spironolactone if BP/renal function will tol   2. CAD s/p CABG  No angina.  Continue ASA, statin, beta-blocker.  3. S/p MV replacement Obtain Echo. SBE prophylaxis.  4. Atrial Fibrillation S/p DCCV 03/26/2018.  Maintaining normal sinus rhythm.  Continue Coumadin.   Severity of Illness: The appropriate patient status for this patient is INPATIENT. Inpatient status is judged to be reasonable and necessary in order to provide the required intensity of service to ensure the patient's safety. The patient's presenting symptoms, physical exam findings, and initial radiographic and laboratory data in the context of their chronic comorbidities is felt to place them at high risk for further clinical deterioration. Furthermore, it is not anticipated that the patient will be medically stable for discharge from the hospital  within 2 midnights of admission. The following factors support the patient status of inpatient.   " The patient's presenting symptoms include shortness of breath, edema, tachycardia. " The worrisome physical exam findings include edema, rales on exam, abd distention. " The initial radiographic and laboratory data are worrisome because of CHF on CXR. " The chronic co-morbidities include CAD, CHF, MV replacement, AFib.  Tereso Newcomer, PA-C  03/27/2018 2:16 PM   Patient seen with PA, agree with the above note.   He had CABG-bioprosthetic MVR in 12/19.  He developed progressive  exertional dyspnea a few weeks after discharge and was found to be in atrial fibrillation.  He had DCCV yesterday and has been in NSR since. However, he feels like his dyspnea worsened since DCCV.  No chest pain.   CXR showed pulmonary edema.  Device interrogated, he is in NSR.   On exam, JVP 12+ cm.  Regular S1S2, 1+ ankle edema, clear lungs.   Patient has acute/chronic systolic CHF post-CABG/MVR and in setting of recent atrial fibrillation.  Doubt ACS.  - Needs repeat echo.  - Lasix 80 mg IV bid.  - Stop olmesartan, start Entresto 24/26 bid.  - Continue low dose Coreg.   He has a MDT dual chamber PPM for CHB and chronically paces his RV.  If EF is persistently low (40-45% on pre-op echo), he will need CRT upgrade.   He is in NSR after cardioversion yesterday, on warfarin.   S/p bioprosthetic MVR.  Will assess with echo.   Marca Ancona 03/27/2018 2:26 PM

## 2018-03-27 NOTE — Progress Notes (Signed)
ANTICOAGULATION CONSULT NOTE - Initial Consult  Pharmacy Consult for warfarin Indication: atrial fibrillation + bioprosthetic MVR  Patient Measurements: Height: 5' 11"  (180.3 cm) Weight: 173 lb 12.8 oz (78.8 kg) IBW/kg (Calculated) : 75.3  Vital Signs: Temp: 97.9 F (36.6 C) (02/15 1157) Temp Source: Oral (02/15 1157) BP: 157/93 (02/15 1430) Pulse Rate: 61 (02/15 1430)  Labs: Recent Labs    03/27/18 1232  HGB 13.9  HCT 45.7  PLT 357  LABPROT 31.0*  INR 3.03  CREATININE 1.02     Medical History: Past Medical History:  Diagnosis Date  . Abnormal TSH   . Arthritis    ddd  . Atrial fibrillation and flutter ([place])    s/p DCCV 03/26/2018  . CAD in native artery    a. 11/2015: s/p 2 vessel PCI of the first diagonal branch of the LAD in the mid left circumflex with DES. // s/p CABG 01/2018  . Chronic combined systolic and diastolic CHF (congestive heart failure) ([place])    Echo 10/19 (prior to MVR, CABG):  Mild LVH, anteroseptal and apical hypokinesis, EF 40, grade 2 diastolic dysfunction, MAC, moderate to severe MR, moderate to severe LAE, normal RV SF, PASP 39, trivial effusion  . Complete heart block (HCC)    a. s/p Medtronic ppm 10/2015.  Marland Kitchen History of radiation therapy 2002  . Hodgkin disease ([place]) 2002   a. s/p chest radiation and chemotherapy  . Hypothyroidism   . Incidental pulmonary nodule 01/19/2018   Left apical opacity - possible scarring  . Mitral regurgitation    s/p bioprosthetic MV replacement 01/2018  . Pacemaker 10/2015   Medtronic  . PVC's (premature ventricular contractions)   . Right bundle branch block   . 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 S/P mitral valve replacement with bioprosthetic valve 01/27/2018   29 mm [facility] Mitral stented bovine pericardial tissue valve     Assessment: Patient is on warfarin prior to admission for AFib. He had a recent CABG and bioprosthetic MVR in December 2019. Current  INR is 3.  PTA warfarin: 5 mg on Monday/Friday, 7.5 mg on all other days  Goal of Therapy:  INR 2-3 Monitor platelets by anticoagulation protocol: Yes   Plan:  -Patient had dose this morning before coming to hospital -Daily INR    Harvel Quale 03/27/2018,2:51 PM

## 2018-03-27 NOTE — ED Notes (Signed)
Kathlen Mody, [place] paged to [phone] @ 1501-per Judson Roch, RN-paged by Levada Dy

## 2018-03-28 ENCOUNTER — Encounter (HOSPITAL_COMMUNITY): Payer: Self-pay | Admitting: Internal Medicine

## 2018-03-28 ENCOUNTER — Inpatient Hospital Stay (HOSPITAL_COMMUNITY): Payer: 59

## 2018-03-28 DIAGNOSIS — E785 Hyperlipidemia, unspecified: Secondary | ICD-10-CM | POA: Diagnosis not present

## 2018-03-28 DIAGNOSIS — I5043 Acute on chronic combined systolic (congestive) and diastolic (congestive) heart failure: Secondary | ICD-10-CM | POA: Diagnosis not present

## 2018-03-28 DIAGNOSIS — I110 Hypertensive heart disease with heart failure: Secondary | ICD-10-CM | POA: Diagnosis not present

## 2018-03-28 DIAGNOSIS — I4819 Other persistent atrial fibrillation: Secondary | ICD-10-CM | POA: Diagnosis not present

## 2018-03-28 LAB — BASIC METABOLIC PANEL
Anion gap: 5 (ref 5–15)
BUN: 18 mg/dL (ref 8–23)
CALCIUM: 8.7 mg/dL — AB (ref 8.9–10.3)
CO2: 30 mmol/L (ref 22–32)
Chloride: 106 mmol/L (ref 98–111)
Creatinine, Ser: 1.18 mg/dL (ref 0.61–1.24)
GFR calc Af Amer: >60 mL/min (ref >60)
GFR calc non Af Amer: >60 mL/min (ref >60)
Glucose, Bld: 108 mg/dL — ABNORMAL HIGH (ref 70–99)
Potassium: 3.4 mmol/L — ABNORMAL LOW (ref 3.5–5.1)
SODIUM: 141 mmol/L (ref 135–145)

## 2018-03-28 LAB — ECHOCARDIOGRAM COMPLETE
Height: 71 in
Weight: 2696 oz

## 2018-03-28 LAB — PROTIME-INR
INR: 2.71
Prothrombin Time: 28.3 seconds — ABNORMAL HIGH (ref 11.4–15.2)

## 2018-03-28 LAB — HIV ANTIBODY (ROUTINE TESTING W REFLEX): HIV SCREEN 4TH GENERATION: NONREACTIVE

## 2018-03-28 MED ORDER — WARFARIN SODIUM 7.5 MG PO TABS
7.5000 mg | ORAL_TABLET | Freq: Once | ORAL | Status: AC
  Administered 2018-03-28: 7.5 mg via ORAL
  Filled 2018-03-28: qty 1

## 2018-03-28 MED ORDER — POTASSIUM CHLORIDE CRYS ER 20 MEQ PO TBCR
20.0000 meq | EXTENDED_RELEASE_TABLET | Freq: Once | ORAL | Status: AC
  Administered 2018-03-28: 20 meq via ORAL
  Filled 2018-03-28: qty 1

## 2018-03-28 MED ORDER — SPIRONOLACTONE 12.5 MG HALF TABLET
12.5000 mg | ORAL_TABLET | Freq: Every day | ORAL | Status: DC
  Administered 2018-03-28 – 2018-03-29 (×2): 12.5 mg via ORAL
  Filled 2018-03-28 (×2): qty 1

## 2018-03-28 MED ORDER — CARVEDILOL 6.25 MG PO TABS
6.2500 mg | ORAL_TABLET | Freq: Two times a day (BID) | ORAL | Status: DC
  Administered 2018-03-28 – 2018-03-29 (×2): 6.25 mg via ORAL
  Filled 2018-03-28 (×2): qty 1

## 2018-03-28 NOTE — Progress Notes (Signed)
ANTICOAGULATION CONSULT NOTE - follow-up Consult  Pharmacy Consult for warfarin Indication: atrial fibrillation + bioprosthetic MVR  Patient Measurements: Height: _0  (180.3 cm) Weight: 168 lb 8 oz (76.4 kg) IBW/kg (Calculated) : 75.3  Vital Signs: Temp: 98 F (36.7 C) (02/16 0545) Temp Source: Oral (02/16 0545) BP: 115/79 (02/16 0545) Pulse Rate: 89 (02/16 0545)  Labs: Recent Labs    03/27/18 1232 03/28/18 0602  HGB 13.9  --   HCT 45.7  --   PLT 357  --   LABPROT 31.0* 28.3*  INR 3.03 2.71  CREATININE 1.02 1.18     Medical History: Past Medical History:  Diagnosis Date  . Abnormal TSH   . Arthritis    ddd  . Atrial fibrillation and flutter ([employer])    s/p DCCV 03/26/2018  . CAD in native artery    a. 11/2015: s/p 2 vessel PCI of the first diagonal branch of the LAD in the mid left circumflex with DES. // s/p CABG 01/2018  . Chronic combined systolic and diastolic CHF (congestive heart failure) ([place])    Echo 10/19 (prior to MVR, CABG):  Mild LVH, anteroseptal and apical hypokinesis, EF 40, grade 2 diastolic dysfunction, MAC, moderate to severe MR, moderate to severe LAE, normal RV SF, PASP 39, trivial effusion  . Complete heart block (HCC)    a. s/p Medtronic ppm 10/2015.  Marland Kitchen History of radiation therapy 2002  . Hodgkin disease ([place]) 2002   a. s/p chest radiation and chemotherapy  . Hypothyroidism   . Incidental pulmonary nodule 01/19/2018   Left apical opacity - possible scarring  . Mitral regurgitation    s/p bioprosthetic MV replacement 01/2018  . Pacemaker 10/2015   Medtronic  . PVC's (premature ventricular contractions)   . Right bundle branch block   . 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 S/P mitral valve replacement with bioprosthetic valve 01/27/2018   29 mm [facility] Mitral stented bovine pericardial tissue valve     Assessment: Patient is on warfarin prior to admission for AFib. He had a recent CABG and  bioprosthetic MVR in December 2019.   INR is therapeutic at 2.7. No bleeding reported. Hgb 13.9, plt 357.  PTA warfarin: 5 mg on Monday/Friday, 7.5 mg on all other days  Goal of Therapy:  INR 2-3 Monitor platelets by anticoagulation protocol: Yes   Plan:  Warfarin 7.54m today - home dose  Daily INR Monitor for s/sx of bleeding and drug interactions    AAzzie RoupD PGY1 Pharmacy Resident  Phone [phone] use AMION for clinical pharmacists numbers  03/28/2018      7:38 AM

## 2018-03-28 NOTE — Progress Notes (Signed)
*  PRELIMINARY RESULTS* Echocardiogram 2D Echocardiogram has been performed.  Tyler Hendrix 03/28/2018, 2:22 PM

## 2018-03-28 NOTE — Plan of Care (Signed)
  Problem: Education: Goal: Ability to demonstrate management of disease process will improve Outcome: Progressing Goal: Ability to verbalize understanding of medication therapies will improve Outcome: Progressing Goal: Individualized Educational Video(s) Outcome: Progressing   Problem: Activity: Goal: Capacity to carry out activities will improve Outcome: Progressing   Problem: Cardiac: Goal: Ability to achieve and maintain adequate cardiopulmonary perfusion will improve Outcome: Progressing   Problem: Education: Goal: Knowledge of General Education information will improve Description Including pain rating scale, medication(s)/side effects and non-pharmacologic comfort measures Outcome: Progressing   Problem: Health Behavior/Discharge Planning: Goal: Ability to manage health-related needs will improve Outcome: Progressing   Problem: Clinical Measurements: Goal: Ability to maintain clinical measurements within normal limits will improve Outcome: Progressing Goal: Will remain free from infection Outcome: Progressing Goal: Diagnostic test results will improve Outcome: Progressing Goal: Respiratory complications will improve Outcome: Progressing Goal: Cardiovascular complication will be avoided Outcome: Progressing   Problem: Activity: Goal: Risk for activity intolerance will decrease Outcome: Progressing   Problem: Nutrition: Goal: Adequate nutrition will be maintained Outcome: Progressing

## 2018-03-28 NOTE — Progress Notes (Signed)
Patient ID: Tyler Hendrix, male   DOB: [birth year], 63 y.o.   MRN: [id number]     Advanced Heart Failure Rounding Note  PCP-Cardiologist: Chrystie Nose, MD   Subjective:    Patient diuresed well last night, net negative 3466 cc and down 4 lbs.  Breathing better.    Lots of questions about HR.  Remains around 100 bpm, NSR with RV pacing.  Per device interrogation yesterday, patient has appeared to be in NSR since DCCV.    Objective:   Weight Range: 76.4 kg Body mass index is 23.5 kg/m.   Vital Signs:   Temp:  [98 F (36.7 C)-98.9 F (37.2 C)] 98.2 F (36.8 C) (02/16 1145) Pulse Rate:  [59-101] 88 (02/16 1145) Resp:  [16-31] 20 (02/16 1145) BP: (97-157)/(69-99) 114/86 (02/16 1145) SpO2:  [93 %-98 %] 95 % (02/16 1145) Weight:  [76.4 kg-78.3 kg] 76.4 kg (02/16 0545) Last BM Date: 03/28/18  Weight change: Filed Weights   03/27/18 1159 03/27/18 1551 03/28/18 0545  Weight: 78.8 kg 78.3 kg 76.4 kg    Intake/Output:   Intake/Output Summary (Last 24 hours) at 03/28/2018 1335 Last data filed at 03/28/2018 1145 Gross per 24 hour  Intake 580 ml  Output 5226 ml  Net -4646 ml      Physical Exam    General:  Well appearing. No resp difficulty HEENT: Normal Neck: Supple. JVP not elevated. Carotids 2+ bilat. No lymphadenopathy or thyromegaly appreciated. Cor: PMI nondisplaced. Regular rate & rhythm. No rubs, gallops. 1/6 SEM RUSB.  Lungs: Clear Abdomen: Soft, nontender, nondistended. No hepatosplenomegaly. No bruits or masses. Good bowel sounds. Extremities: No cyanosis, clubbing, rash, edema Neuro: Alert & orientedx3, cranial nerves grossly intact. moves all 4 extremities w/o difficulty. Affect pleasant   Telemetry   Sinus tachy (versus atypical flutter) at 100 bpm (personally reviewed)  Labs    CBC Recent Labs    03/27/18 1232  WBC 9.7  HGB 13.9  HCT 45.7  MCV 87.4  PLT 357   Basic Metabolic Panel Recent Labs    40/98/11 1232 03/28/18 0602  NA 137 141   K 4.4 3.4*  CL 104 106  CO2 26 30  GLUCOSE 111* 108*  BUN 16 18  CREATININE 1.02 1.18  CALCIUM 9.1 8.7*  MG 2.2  --    Liver Function Tests No results for input(s): AST, ALT, ALKPHOS, BILITOT, PROT, ALBUMIN in the last 72 hours. No results for input(s): LIPASE, AMYLASE in the last 72 hours. Cardiac Enzymes No results for input(s): CKTOTAL, CKMB, CKMBINDEX, TROPONINI in the last 72 hours.  BNP: BNP (last 3 results) Recent Labs    03/27/18 1232  BNP 1,710.6*    ProBNP (last 3 results) No results for input(s): PROBNP in the last 8760 hours.   D-Dimer No results for input(s): DDIMER in the last 72 hours. Hemoglobin A1C No results for input(s): HGBA1C in the last 72 hours. Fasting Lipid Panel No results for input(s): CHOL, HDL, LDLCALC, TRIG, CHOLHDL, LDLDIRECT in the last 72 hours. Thyroid Function Tests Recent Labs    03/27/18 1232  TSH 4.630*    Other results:   Imaging     No results found.   Medications:     Scheduled Medications: . aspirin EC  81 mg Oral Daily  . atorvastatin  80 mg Oral q1800  . carvedilol  6.25 mg Oral BID WC  . levothyroxine  75 mcg Oral QAC breakfast  . loratadine  10 mg Oral QHS  .  montelukast  10 mg Oral QHS  . multivitamin with minerals  1 tablet Oral Daily  . potassium chloride  20 mEq Oral BID  . potassium chloride  20 mEq Oral Once  . sacubitril-valsartan  1 tablet Oral BID  . sodium chloride flush  3 mL Intravenous Q12H  . spironolactone  12.5 mg Oral Daily  . traZODone  50 mg Oral QHS  . warfarin  7.5 mg Oral ONCE-1800  . Warfarin - Pharmacist Dosing Inpatient   Does not apply q1800     Infusions: . sodium chloride       PRN Medications:  sodium chloride, acetaminophen, hydrOXYzine, ondansetron (ZOFRAN) IV, sodium chloride flush    Assessment/Plan   1. Acute on chronic systolic CHF: He had CABG-bioprosthetic MVR in 12/19.  He developed progressive exertional dyspnea a few weeks after discharge and was  found to be in atrial fibrillation.  He had DCCV 2/14 to NSR but dyspnea worsened so came to hospital. Echo pre-op showed EF 40%.  He has diuresed well, volume status much improved.  - Stop IV Lasix, start Lasix 40 mg po daily.  - Continue Entresto 24/26 bid.  - Can add low dose spironolactone 12.5 daily and increase Coreg to 6.25 mg bid.  - He needs echo today, will arrange.  He has a MDT dual chamber PPM for CHB and chronically paces his RV.  If EF is persistently low (40-45% on pre-op echo), he will need CRT upgrade.  2. Atrial fibrillation: Paroxysmal.  [employer] device interrogated at admission yesterday. Though HR elevated, he appeared to have been in NSR since DCCV on 2/14.  Today, he appears to be in the same rhythm with rate 100s.  Probably NSR based on yesterday's interrogation, but cannot totally rule out an atypical flutter.  - Patient is very concerned that HR is much higher than typical for him => will have device interrogated again tomorrow and reassess.  - Continue warfarin.  - As above, increase Coreg.  3. S/p bioprosthetic mitral valve: Assess by echo today.  4. CAD: s/p CABG.  No chest pain.  He remains on ASA, statin.  5. H/o complete heart block: Has MDT PPM.   Length of Stay: 1  Marca Ancona, MD  03/28/2018, 1:35 PM  Advanced Heart Failure Team Pager [phone] (M-F; 7a - 4p)  Please contact [facility] for night-coverage after hours (4p -7a ) and weekends on [email or web address]

## 2018-03-29 ENCOUNTER — Encounter (HOSPITAL_COMMUNITY): Payer: Self-pay | Admitting: General Practice

## 2018-03-29 ENCOUNTER — Other Ambulatory Visit: Payer: Self-pay | Admitting: Cardiology

## 2018-03-29 DIAGNOSIS — Z79899 Other long term (current) drug therapy: Secondary | ICD-10-CM

## 2018-03-29 DIAGNOSIS — I5043 Acute on chronic combined systolic (congestive) and diastolic (congestive) heart failure: Secondary | ICD-10-CM | POA: Diagnosis not present

## 2018-03-29 DIAGNOSIS — I471 Supraventricular tachycardia: Secondary | ICD-10-CM

## 2018-03-29 DIAGNOSIS — Z952 Presence of prosthetic heart valve: Secondary | ICD-10-CM

## 2018-03-29 LAB — PROTIME-INR
INR: 2.03
PROTHROMBIN TIME: 22.7 s — AB (ref 11.4–15.2)

## 2018-03-29 LAB — BASIC METABOLIC PANEL
Anion gap: 9 (ref 5–15)
BUN: 22 mg/dL (ref 8–23)
CO2: 26 mmol/L (ref 22–32)
Calcium: 8.7 mg/dL — ABNORMAL LOW (ref 8.9–10.3)
Chloride: 105 mmol/L (ref 98–111)
Creatinine, Ser: 1.11 mg/dL (ref 0.61–1.24)
GFR calc Af Amer: >60 mL/min (ref >60)
Glucose, Bld: 109 mg/dL — ABNORMAL HIGH (ref 70–99)
Potassium: 3.8 mmol/L (ref 3.5–5.1)
Sodium: 140 mmol/L (ref 135–145)

## 2018-03-29 MED ORDER — SACUBITRIL-VALSARTAN 24-26 MG PO TABS: 1.0000 | ORAL_TABLET | Freq: Two times a day (BID) | ORAL | 0 refills | Status: DC

## 2018-03-29 MED ORDER — FUROSEMIDE 40 MG PO TABS: 40.0000 mg | ORAL_TABLET | Freq: Every day | ORAL | 1 refills | Status: DC

## 2018-03-29 MED ORDER — SPIRONOLACTONE 25 MG PO TABS: 12.5000 mg | ORAL_TABLET | Freq: Every day | ORAL | 1 refills | Status: DC

## 2018-03-29 MED ORDER — POTASSIUM CHLORIDE ER 20 MEQ PO TBCR: 20.0000 meq | EXTENDED_RELEASE_TABLET | Freq: Every day | ORAL | 0 refills | Status: DC

## 2018-03-29 MED ORDER — CARVEDILOL 6.25 MG PO TABS: 6.2500 mg | ORAL_TABLET | Freq: Two times a day (BID) | ORAL | 0 refills | Status: DC

## 2018-03-29 MED ORDER — SACUBITRIL-VALSARTAN 24-26 MG PO TABS: 1.0000 | ORAL_TABLET | Freq: Two times a day (BID) | ORAL | 2 refills | Status: DC

## 2018-03-29 MED ORDER — FUROSEMIDE 40 MG PO TABS: 40.0000 mg | ORAL_TABLET | Freq: Every day | ORAL | 0 refills | Status: DC

## 2018-03-29 MED ORDER — POTASSIUM CHLORIDE ER 20 MEQ PO TBCR: 20.0000 meq | EXTENDED_RELEASE_TABLET | Freq: Every day | ORAL | 1 refills | Status: DC

## 2018-03-29 MED ORDER — SPIRONOLACTONE 25 MG PO TABS: 12.5000 mg | ORAL_TABLET | Freq: Every day | ORAL | 0 refills | Status: DC

## 2018-03-29 MED ORDER — CARVEDILOL 6.25 MG PO TABS: 6.2500 mg | ORAL_TABLET | Freq: Two times a day (BID) | ORAL | 2 refills | Status: DC

## 2018-03-29 MED FILL — SPIRONOLACTONE 25 MG TABLET: 25 | 30 days supply | Qty: 15 | Fill #0

## 2018-03-29 MED FILL — POTASSIUM CL ER 20 MEQ TABL: 20 | 30 days supply | Qty: 30 | Fill #0

## 2018-03-29 MED FILL — CARVEDILOL 6.25 MG TABLET: 6.25 | 30 days supply | Qty: 60 | Fill #0

## 2018-03-29 MED FILL — FUROSEMIDE 40 MG TABLET: 40 | 30 days supply | Qty: 30 | Fill #0

## 2018-03-29 MED FILL — ENTRESTO 24 MG-26 MG TABLET: 24-26 | 30 days supply | Qty: 60 | Fill #0

## 2018-03-29 NOTE — Progress Notes (Addendum)
The patient has been seen in conjunction with Ina Homes, MD. All aspects of care have been considered and discussed. The patient has been personally interviewed, examined, and all clinical data has been reviewed.   The patient is improved.  He is on guideline directed therapy for systolic left ventricular dysfunction (ARNI; beta-blocker; MRA).  Plan discharge today  Follow-up with Dr. Rayann Heman April 05, 2018  [place] upcoming appointment with Bunnie Domino.  Basic metabolic panel Thursday or Friday of this week.  Results forwarded to Dr. Debara Pickett.  Already has an office appointment to see Dr. Debara Pickett in early March.  Progress Note  Patient Name: Tyler Hendrix Date of Encounter: 03/29/2018  Primary Cardiologist: Pixie Casino, MD   Subjective   Patient feeling much better today. Able to walk to 3W yesterday without SHOB. He remains concerned about his HR and his LVEF. Wondering about the probability of getting his LVEF back to 50%.   Inpatient Medications    Scheduled Meds: . aspirin EC  81 mg Oral Daily  . atorvastatin  80 mg Oral q1800  . carvedilol  6.25 mg Oral BID WC  . levothyroxine  75 mcg Oral QAC breakfast  . loratadine  10 mg Oral QHS  . montelukast  10 mg Oral QHS  . multivitamin with minerals  1 tablet Oral Daily  . potassium chloride  20 mEq Oral BID  . sacubitril-valsartan  1 tablet Oral BID  . sodium chloride flush  3 mL Intravenous Q12H  . spironolactone  12.5 mg Oral Daily  . traZODone  50 mg Oral QHS  . Warfarin - Pharmacist Dosing Inpatient   Does not apply q1800   Continuous Infusions: . sodium chloride     PRN Meds: sodium chloride, acetaminophen, hydrOXYzine, ondansetron (ZOFRAN) IV, sodium chloride flush   Vital Signs    Vitals:   03/28/18 2043 03/29/18 0000 03/29/18 0535 03/29/18 0844  BP: 112/75 105/73 118/84 110/67  Pulse: 88 92 96 62  Resp:  18 20   Temp:  98.4 F (36.9 C) 98.3 F (36.8 C)   TempSrc:  Oral Oral   SpO2:   97% 98% 96%  Weight:   74.4 kg   Height:        Intake/Output Summary (Last 24 hours) at 03/29/2018 0936 Last data filed at 03/29/2018 0843 Gross per 24 hour  Intake 960 ml  Output 1801 ml  Net -841 ml   Filed Weights   03/27/18 1551 03/28/18 0545 03/29/18 0535  Weight: 78.3 kg 76.4 kg 74.4 kg   Telemetry    Ventricular paced rhythm - Personally Reviewed  ECG    No new EKG  Physical Exam   Today's Vitals   03/29/18 0000 03/29/18 0535 03/29/18 0844 03/29/18 0925  BP: 105/73 118/84 110/67   Pulse: 92 96 62   Resp: 18 20    Temp: 98.4 F (36.9 C) 98.3 F (36.8 C)    TempSrc: Oral Oral    SpO2: 97% 98% 96%   Weight:  74.4 kg    Height:      PainSc:    0-No pain   Body mass index is 22.89 kg/m.  GEN: No acute distress.   Neck: No JVD Cardiac: RRR, no murmurs, rubs, or gallops.  Respiratory: Clear to auscultation bilaterally. GI: Soft, nontender, non-distended  MS: trace pitting edema; No deformity. Neuro:  Nonfocal  Psych: Normal affect   Labs    Chemistry Recent Labs  Lab 03/27/18 1232 03/28/18 0602  03/29/18 0602  NA 137 141 140  K 4.4 3.4* 3.8  CL 104 106 105  CO2 26 30 26   GLUCOSE 111* 108* 109*  BUN 16 18 22   CREATININE 1.02 1.18 1.11  CALCIUM 9.1 8.7* 8.7*  GFRNONAA >60 >60 >60  GFRAA >60 >60 >60  ANIONGAP 7 5 9     Hematology Recent Labs  Lab 03/24/18 0919 03/27/18 1232  WBC 9.4 9.7  RBC 5.06 5.23  HGB 13.9 13.9  HCT 43.5 45.7  MCV 86 87.4  MCH 27.5 26.6  MCHC 32.0 30.4  RDW 14.5 15.1  PLT 384 357   Cardiac EnzymesNo results for input(s): TROPONINI in the last 168 hours. No results for input(s): TROPIPOC in the last 168 hours.   BNP Recent Labs  Lab 03/27/18 1232  BNP 1,710.6*    DDimer No results for input(s): DDIMER in the last 168 hours.   Radiology    Dg Chest Port 1 View  Result Date: 03/27/2018 CLINICAL DATA:  Palpitations. Shortness of breath. EXAM: PORTABLE CHEST 1 VIEW COMPARISON:  02/22/2018 FINDINGS:  Sequelae of mitral valve replacement and CABG are again identified. A dual chamber pacemaker remains in place. The cardiac silhouette remains enlarged. There are small bilateral pleural effusions which are larger than on the prior study. Left basilar airspace opacity is new. There is also mild pulmonary vascular congestion and new mild diffuse prominence of the interstitial markings. No pneumothorax is identified. No acute osseous abnormality is seen. IMPRESSION: 1. Cardiomegaly with small bilateral pleural effusions and suspected mild interstitial edema. 2.  Left basilar atelectasis versus pneumonia. Electronically Signed   By: Logan Bores M.D.   On: 03/27/2018 13:42   Cardiac Studies   TTE 03/28/2018  1. The left ventricle has severely reduced systolic function, with an ejection fraction of 25-30%. The cavity size was moderately dilated. There is moderately increased left ventricular wall thickness. Left ventricular diastolic Doppler parameters are  indeterminate.  2. Diffuse hypokinesis worse in the inferior [place] septum.  3. The right ventricle has normal systolic function. The cavity was normal. There is no increase in right ventricular wall thickness.  4. ? pacing wires in RA/RV.  5. Moderate pericardial effusion.  6. Mildly calcified tricuspid valve leaflets.  7. Mildly thickened tricuspid valve leaflets.  8. MVR.  9. Thickened and calcified tissue MVR Trivial central MR and mild MS by gradients Significant shadowing artifact in LA. 10. The tricuspid valve is degenerative. 11. The aortic valve is tricuspid Moderate thickening of the aortic valve Sclerosis without any evidence of stenosis of the aortic valve. 12. The pulmonic valve was grossly normal. Pulmonic valve regurgitation is mild by color flow Doppler. 13. The aortic root is normal in size and structure. 14. Right atrial pressure is estimated at 8 mmHg.  Patient Profile     Tyler Hendrix is a 63 y.o. male with a hx of  Hodgkin's disease treated with combined chemotherapy and radiation therapy to the chest in 2002, complete heart block status post permanent pacemaker implantation in 2017, coronary artery disease status post multivessel PCI and stenting in 8366, combined systolic and diastolic heart failure, mitral regurgitation status post bioprosthetic mitral valve replacement and CABG x2 in December 2019, atrial fibrillation status post cardioversion 03/26/2018 who is being seen today for the evaluation of tachycardia and shortness of breath at the request of Dr. Donney Rankins.  Assessment & Plan    Acute on Chronic HFrEF - Patient SHOB and exertional dyspnea significantly improved  -  Echo with reduced LVEF of 25-30% (combination MR, RV pacing, atypical flutter) - Net negative 4.3 L since admission  - Start Furosemide 40 mg QD - Continue Entresto 24/26 bid, spironolactone 12.5 daily, and Coreg 6.25 mg bid.  - MDT to interrogate pacemaker today  - Will need EP follow-up for consideration of CRT  - Once medically optimized will need repeat echo to assess if he needs ICD  PAF - DCCV on 03/26/2018  - Initial EKG concerning for atypical flutter, repeat appears to be in sinus  - Continue warfarin and coreg  - Will follow-up with EP  MR s/p bioprosthetic mitral valve  - Continue warfarin   CAD s/p 2 vessel CABG - Continue ASA, ARNI, BB, and aldosterone antagonist   Will discuss the case further with Dr. Tamala Julian   For questions or updates, please contact [place] Please consult [email or web address] for contact info under Cardiology/STEMI.   Signed, Ina Homes, MD  03/29/2018, 9:36 AM

## 2018-03-29 NOTE — Discharge Summary (Signed)
Discharge Summary    Patient ID: Tyler Hendrix,  MRN: [id number], DOB/AGE: [birth year] 63 y.o.  Admit date: 03/27/2018 Discharge date: 03/29/2018  Primary Care Provider: Gershon Crane A Primary Cardiologist: Dr. Rennis Golden   Discharge Diagnoses    Active Problems:   Acute on chronic combined systolic (congestive) and diastolic (congestive) heart failure (HCC)   Essential hypertension   Exertional dyspnea   Hyperlipidemia   Allergies Allergies  Allergen Reactions  . Other     Pt reported allergic to dust mice that causes of sneezing, runny nose    Diagnostic Studies/Procedures    TTE: 03/28/2018  IMPRESSIONS    1. The left ventricle has severely reduced systolic function, with an ejection fraction of 25-30%. The cavity size was moderately dilated. There is moderately increased left ventricular wall thickness. Left ventricular diastolic Doppler parameters are  indeterminate.  2. Diffuse hypokinesis worse in the inferior [place] septum.  3. The right ventricle has normal systolic function. The cavity was normal. There is no increase in right ventricular wall thickness.  4. ? pacing wires in RA/RV.  5. Moderate pericardial effusion.  6. Mildly calcified tricuspid valve leaflets.  7. Mildly thickened tricuspid valve leaflets.  8. MVR.  9. Thickened and calcified tissue MVR Trivial central MR and mild MS by gradients Significant shadowing artifact in LA. 10. The tricuspid valve is degenerative. 11. The aortic valve is tricuspid Moderate thickening of the aortic valve Sclerosis without any evidence of stenosis of the aortic valve. 12. The pulmonic valve was grossly normal. Pulmonic valve regurgitation is mild by color flow Doppler. 13. The aortic root is normal in size and structure. 14. Right atrial pressure is estimated at 8 mmHg. _____________   History of Present Illness     63 y.o. male with a hx of Hodgkin's disease treated with combined chemotherapy and radiation  therapy to the chest in 2002, complete heart block status post permanent pacemaker implantation in 2017, coronary artery disease status post multivessel PCI and stenting in 2017, combined systolic and diastolic heart failure, mitral regurgitation status post bioprosthetic mitral valve replacement and CABG x2 in December 2019, atrial fibrillation status post cardioversion 03/26/2018 who presented with tachycardia and shortness of breath.  Tyler Hendrix underwent CABG x2 and mitral valve replacement with bioprosthetic valve in December 2019.  Postoperative course was fairly uneventful.  However, he followed up 03/16/2018 in the clinic and was noted to be in persistent atrial fibrillation. He is currently on Coumadin for anticoagulation.  Elective cardioversion was arranged.  This was performed 03/26/2018 with successful conversion to normal sinus rhythm with atrial pacing.  Patient called the answering service with complaints of rapid heartbeat last night as well as shortness of breath.  He presented to the emergency room. EKG appeared to demonstrate underlying atrial flutter with rapid ventricular rate.  However, interrogation of his device demonstrated normal sinus rhythm since his cardioversion.  He stated that he had been short of breath for a month.  He noted orthopnea, paroxysmal nocturnal dyspnea, leg swelling and increased abdominal girth.  He had not had an appetite and had not had any weight gain.  CXR shows +CHF and his BNP is 1700. He was admitted for diuresis.   Hospital Course     Acute on Chronic HFrEF - Patient SHOB and exertional dyspnea significantly improved  - Echo with reduced LVEF of 25-30% (combination MR, RV pacing, atypical flutter) - Net negative 4.3 L since admission, weight 164.1lbs at discharge -  Started on Furosemide 40 mg QD, with K+ supplement - Continue Entresto 24/26 bid, spironolactone 12.5 daily, and Coreg 6.25 mg bid (titrated up from 3.125mg  BID).  - EP follow-up with  Dr. Johney Frame - recheck echo as outpatient  PAF - DCCV on 03/26/2018  - Initial EKG concerning for atypical flutter, repeat appears to be in sinus  - Continue warfarin at home dose and coreg  - Will follow-up with EP as outpatient  MR s/p bioprosthetic mitral valve  - Continue warfarin, INR 2.03  CAD s/p 2 vessel CABG - Continue ASA, ARNI, BB, and aldosterone antagonist    Tyler Hendrix was seen by Dr. Katrinka Blazing and determined stable for discharge home. Follow up in the office has been arranged. Medications are listed below.   _____________  Discharge Vitals Blood pressure 110/67, pulse 62, temperature 98.3 F (36.8 C), temperature source Oral, resp. rate 20, height 5\' 11"  (1.803 m), weight 74.4 kg, SpO2 96 %.  Filed Weights   03/27/18 1551 03/28/18 0545 03/29/18 0535  Weight: 78.3 kg 76.4 kg 74.4 kg    Labs & Radiologic Studies    CBC Recent Labs    03/27/18 1232  WBC 9.7  HGB 13.9  HCT 45.7  MCV 87.4  PLT 357   Basic Metabolic Panel Recent Labs    16/10/96 1232 03/28/18 0602 03/29/18 0602  NA 137 141 140  K 4.4 3.4* 3.8  CL 104 106 105  CO2 26 30 26   GLUCOSE 111* 108* 109*  BUN 16 18 22   CREATININE 1.02 1.18 1.11  CALCIUM 9.1 8.7* 8.7*  MG 2.2  --   --    Liver Function Tests No results for input(s): AST, ALT, ALKPHOS, BILITOT, PROT, ALBUMIN in the last 72 hours. No results for input(s): LIPASE, AMYLASE in the last 72 hours. Cardiac Enzymes No results for input(s): CKTOTAL, CKMB, CKMBINDEX, TROPONINI in the last 72 hours. BNP Invalid input(s): POCBNP D-Dimer No results for input(s): DDIMER in the last 72 hours. Hemoglobin A1C No results for input(s): HGBA1C in the last 72 hours. Fasting Lipid Panel No results for input(s): CHOL, HDL, LDLCALC, TRIG, CHOLHDL, LDLDIRECT in the last 72 hours. Thyroid Function Tests Recent Labs    03/27/18 1232  TSH 4.630*   _____________  Dg Chest Port 1 View  Result Date: 03/27/2018 CLINICAL DATA:   Palpitations. Shortness of breath. EXAM: PORTABLE CHEST 1 VIEW COMPARISON:  02/22/2018 FINDINGS: Sequelae of mitral valve replacement and CABG are again identified. A dual chamber pacemaker remains in place. The cardiac silhouette remains enlarged. There are small bilateral pleural effusions which are larger than on the prior study. Left basilar airspace opacity is new. There is also mild pulmonary vascular congestion and new mild diffuse prominence of the interstitial markings. No pneumothorax is identified. No acute osseous abnormality is seen. IMPRESSION: 1. Cardiomegaly with small bilateral pleural effusions and suspected mild interstitial edema. 2.  Left basilar atelectasis versus pneumonia. Electronically Signed   By: Sebastian Ache M.D.   On: 03/27/2018 13:42   Disposition   Pt is being discharged home today in good condition.  Follow-up Plans & Appointments    Follow-up Information    Hillis Range, MD Follow up on 04/05/2018.   Specialty:  Cardiology Why:  at 4:30pm for your follow up appt.  Contact information: [address] [place] [address] [phone]        [facility] Follow up on 04/01/2018.  Why:  Please come in for follow up labs Contact information: [address] [place] [address] [phone]         Discharge Instructions    Amb referral to [facility] Clinic   Complete by:  As directed    Diet - low sodium heart healthy   Complete by:  As directed    Discharge instructions   Complete by:  As directed    For patients with congestive heart failure, we give them these special instructions:  1. Follow a low-salt diet and watch your fluid intake. In general, you should not be taking in more than 2 liters of fluid per day (no more than 8 glasses per day). Some patients are restricted to less than 1.5 liters of fluid per day (no more than 6 glasses per day). This includes  sources of water in foods like soup, coffee, tea, milk, etc. 2. Weigh yourself on the same scale at same time of day and keep a log. 3. Call your doctor: (Anytime you feel any of the following symptoms)  - 3-4 pound weight gain in 1-2 days or 2 pounds overnight  - Shortness of breath, with or without a dry hacking cough  - Swelling in the hands, feet or stomach  - If you have to sleep on extra pillows at night in order to breathe   IT IS IMPORTANT TO LET YOUR DOCTOR KNOW EARLY ON IF YOU ARE HAVING SYMPTOMS SO WE CAN HELP YOU!   Increase activity slowly   Complete by:  As directed        Discharge Medications     Medication List    TAKE these medications   aspirin EC 81 MG tablet Take 81 mg by mouth daily.   atorvastatin 80 MG tablet Commonly known as:  LIPITOR TAKE 1 TABLET BY MOUTH  DAILY AT 6 PM. What changed:  See the new instructions.   carvedilol 6.25 MG tablet Commonly known as:  COREG Take 1 tablet (6.25 mg total) by mouth 2 (two) times daily with a meal. What changed:    medication strength  how much to take   furosemide 40 MG tablet Commonly known as:  LASIX Take 1 tablet (40 mg total) by mouth daily.   levocetirizine 5 MG tablet Commonly known as:  XYZAL Take 5 mg by mouth at bedtime.   levothyroxine 75 MCG tablet Commonly known as:  SYNTHROID, LEVOTHROID TAKE 1 TABLET BY MOUTH  DAILY What changed:    how much to take  how to take this  when to take this  additional instructions   montelukast 10 MG tablet Commonly known as:  SINGULAIR Take 10 mg by mouth at bedtime.   multivitamin with minerals Tabs tablet Take 1 tablet by mouth daily.   Potassium Chloride ER 20 MEQ Tbcr Take 20 mEq by mouth daily. While taking lasix.   sacubitril-valsartan 24-26 MG Commonly known as:  ENTRESTO Take 1 tablet by mouth 2 (two) times daily.   spironolactone 25 MG tablet Commonly known as:  ALDACTONE Take 0.5 tablets (12.5 mg total) by mouth  daily. Start taking on:  March 30, 2018   traZODone 50 MG tablet Commonly known as:  DESYREL Take 1 tablet (50 mg total) by mouth at bedtime.   warfarin 5 MG tablet Commonly known as:  COUMADIN Take as directed. If you are unsure how to take this medication, talk to your nurse or doctor. Original instructions:  Take 1.5 tablets (7.5 mg  total) by mouth daily. Take 7.5mg  daily except  5 mg on  every Monday and Friday, Or As Directed. What changed:    how much to take  when to take this  additional instructions        Acute coronary syndrome (MI, NSTEMI, STEMI, etc) this admission?: No.     Outstanding Labs/Studies   BMET 04/01/2018  Duration of Discharge Encounter   Greater than 30 minutes including physician time.  Signed, Laverda Page NP-C 03/29/2018, 11:16 AM

## 2018-03-31 ENCOUNTER — Ambulatory Visit: Payer: 59 | Admitting: Adult Health

## 2018-04-01 ENCOUNTER — Other Ambulatory Visit: Payer: 59 | Admitting: *Deleted

## 2018-04-01 DIAGNOSIS — Z79899 Other long term (current) drug therapy: Secondary | ICD-10-CM

## 2018-04-02 LAB — BASIC METABOLIC PANEL
BUN/Creatinine Ratio: 14 (ref 10–24)
BUN: 16 mg/dL (ref 8–27)
CO2: 25 mmol/L (ref 20–29)
Calcium: 9.7 mg/dL (ref 8.6–10.2)
Chloride: 96 mmol/L (ref 96–106)
Creatinine, Ser: 1.15 mg/dL (ref 0.76–1.27)
GFR calc Af Amer: 79 mL/min/{1.73_m2} (ref >59)
GFR calc non Af Amer: 68 mL/min/{1.73_m2} (ref >59)
Glucose: 162 mg/dL — ABNORMAL HIGH (ref 65–99)
Potassium: 4.2 mmol/L (ref 3.5–5.2)
Sodium: 139 mmol/L (ref 134–144)

## 2018-04-05 ENCOUNTER — Telehealth: Payer: Self-pay

## 2018-04-05 ENCOUNTER — Encounter: Payer: Self-pay | Admitting: Internal Medicine

## 2018-04-05 ENCOUNTER — Ambulatory Visit (INDEPENDENT_AMBULATORY_CARE_PROVIDER_SITE_OTHER): Payer: 59 | Admitting: Internal Medicine

## 2018-04-05 VITALS — BP 132/80 | HR 57 | Ht 71.0 in | Wt 157.0 lb

## 2018-04-05 DIAGNOSIS — Z950 Presence of cardiac pacemaker: Secondary | ICD-10-CM | POA: Diagnosis not present

## 2018-04-05 DIAGNOSIS — I442 Atrioventricular block, complete: Secondary | ICD-10-CM

## 2018-04-05 DIAGNOSIS — I4819 Other persistent atrial fibrillation: Secondary | ICD-10-CM

## 2018-04-05 DIAGNOSIS — I2510 Atherosclerotic heart disease of native coronary artery without angina pectoris: Secondary | ICD-10-CM | POA: Diagnosis not present

## 2018-04-05 DIAGNOSIS — I5022 Chronic systolic (congestive) heart failure: Secondary | ICD-10-CM

## 2018-04-05 DIAGNOSIS — Z951 Presence of aortocoronary bypass graft: Secondary | ICD-10-CM | POA: Diagnosis not present

## 2018-04-05 LAB — CUP PACEART INCLINIC DEVICE CHECK
Battery Remaining Longevity: 73 mo
Battery Voltage: 3.01 V
Brady Statistic AP VP Percent: 8.45 %
Brady Statistic AP VS Percent: 0.01 %
Brady Statistic AS VP Percent: 91.47 %
Brady Statistic AS VS Percent: 0.08 %
Brady Statistic RA Percent Paced: 8.45 %
Brady Statistic RV Percent Paced: 99.88 %
Date Time Interrogation Session: 20200224180629
Implantable Lead Implant Date: 20170922
Implantable Lead Location: 753859
Implantable Lead Location: 753860
Implantable Lead Model: 5076
Implantable Lead Model: 5076
Implantable Pulse Generator Implant Date: 20170922
Lead Channel Impedance Value: 323 Ohm
Lead Channel Impedance Value: 380 Ohm
Lead Channel Impedance Value: 399 Ohm
Lead Channel Pacing Threshold Amplitude: 0.75 V
Lead Channel Pacing Threshold Amplitude: 0.75 V
Lead Channel Pacing Threshold Pulse Width: 0.4 ms
Lead Channel Pacing Threshold Pulse Width: 0.4 ms
Lead Channel Sensing Intrinsic Amplitude: 2.5 mV
Lead Channel Sensing Intrinsic Amplitude: 7.75 mV
Lead Channel Setting Pacing Amplitude: 2 V
Lead Channel Setting Pacing Amplitude: 2.5 V
Lead Channel Setting Pacing Pulse Width: 0.4 ms
Lead Channel Setting Sensing Sensitivity: 2 mV
MDC IDC LEAD IMPLANT DT: 20170922
MDC IDC MSMT LEADCHNL RV IMPEDANCE VALUE: 532 Ohm

## 2018-04-05 NOTE — Addendum Note (Signed)
Addended by: Willeen Cass A on: 04/05/2018 05:31 PM   Modules accepted: Orders

## 2018-04-05 NOTE — Telephone Encounter (Signed)
Notes recorded by Frederik Schmidt, RN on 04/05/2018 at 8:13 AM EST The patient has been notified of the result by VM.  Frederik Schmidt, RN 04/05/2018 8:12 AM

## 2018-04-05 NOTE — Patient Instructions (Addendum)
Medication Instructions:  Your physician recommends that you continue on your current medications as directed. Please refer to the Current Medication list given to you today.  Labwork: None ordered.  Testing/Procedures: Your physician has requested that you have an echocardiogram. Echocardiography is a painless test that uses sound waves to create images of your heart. It provides your doctor with information about the size and shape of your heart and how well your heart's chambers and valves are working. This procedure takes approximately one hour. There are no restrictions for this procedure.  Please schedule for an ECHO in 3 months (just prior to office visit)  Follow-Up: Your physician wants you to follow-up in: 3 months with Dr. Rayann Heman.     Remote monitoring is used to monitor your Pacemaker from home. This monitoring reduces the number of office visits required to check your device to one time per year. It allows [place] to keep an eye on the functioning of your device to ensure it is working properly. You are scheduled for a device check from home on 07/06/2018. You may send your transmission at any time that day. If you have a wireless device, the transmission will be sent automatically. After your physician reviews your transmission, you will receive a postcard with your next transmission date.  Any Other Special Instructions Will Be Listed Below (If Applicable).  If you need a refill on your cardiac medications before your next appointment, please call your pharmacy.

## 2018-04-05 NOTE — Telephone Encounter (Signed)
-----   Message from Cheryln Manly, NP sent at 04/03/2018  9:17 AM EST ----- Please inform patient that his labs are stable. Continue current medication and keep follow up appt.

## 2018-04-05 NOTE — Progress Notes (Signed)
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 male who presents today for routine electrophysiology followup.  Since last being seen in our clinic, the patient reports doing reasonably well.  He underwent CABG x 2 with MV replacement.  He has developed CHF with EF 25%.  He also had afib recently for which he has since been cardioverted.  He is making substantial improvement now.  Today, he denies symptoms of palpitations, chest pain,   lower extremity edema, dizziness, presyncope, or syncope.  The patient is otherwise without complaint today.   Past Medical History:  Diagnosis Date  . Abnormal TSH   . Arthritis    ddd  . Atrial fibrillation and flutter ([place])    s/p DCCV 03/26/2018  . CAD in native artery    a. 11/2015: s/p 2 vessel PCI of the first diagonal branch of the LAD in the mid left circumflex with DES. // s/p CABG 01/2018  . Chronic combined systolic and diastolic CHF (congestive heart failure) ([place])    Echo 10/19 (prior to MVR, CABG):  Mild LVH, anteroseptal and apical hypokinesis, EF 40, grade 2 diastolic dysfunction, MAC, moderate to severe MR, moderate to severe LAE, normal RV SF, PASP 39, trivial effusion  . Complete heart block ([facility])    a. s/p Medtronic ppm 10/2015.  Marland Kitchen History of radiation therapy 2002  . Hodgkin disease ([place]) 2002   a. s/p chest radiation and chemotherapy  . Hypothyroidism   . Incidental pulmonary nodule 01/19/2018   Left apical opacity - possible scarring  . Mitral regurgitation    s/p bioprosthetic MV replacement 01/2018  . Pacemaker 10/2015   Medtronic  . PVC's (premature ventricular contractions)   . Right bundle branch block   . 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 S/P mitral valve replacement with bioprosthetic valve 01/27/2018   29 mm [facility] Mitral stented bovine pericardial tissue valve   Past Surgical History:  Procedure  Laterality Date  . CARDIAC CATHETERIZATION N/A 11/02/2015   Procedure: Left Heart Cath and Coronary Angiography;  Surgeon: Sherren Mocha, MD;  Location: [place] [facility];  Service: Cardiovascular;  Laterality: N/A;  . CARDIAC CATHETERIZATION N/A 11/21/2015   Procedure: Coronary Stent Intervention;  Surgeon: Sherren Mocha, MD;  Location: [place] [facility];  Service: Cardiovascular;  Laterality: N/A;  . CARDIAC CATHETERIZATION  11/2017  . CARDIOVERSION N/A 03/26/2018   Procedure: CARDIOVERSION;  Surgeon: [name], MD;  Location[facility] ENDOSCOPY;  Service: Cardiovascular;  Laterality: N/A;  . CORONARY ARTERY BYPASS GRAFT N/A 01/27/2018   Procedure: CORONARY ARTERY BYPASS GRAFTING (CABG) x two, using left internal mammary artery and right leg greater saphenous vein harvested endoscopically;  Surgeon: Rexene Alberts, MD;  Location: [place];  Service: Open Heart Surgery;  Laterality: N/A;  . EP IMPLANTABLE DEVICE N/A 11/02/2015   MDT Adivisa MRI conditional dual chamber pacemaker implanted by Dr Rayann Heman for complete heart block  . KNEE SURGERY Right 1972  . LUMBAR LAMINECTOMY  1996  . MITRAL VALVE REPLACEMENT N/A 01/27/2018   Procedure: MITRAL VALVE (MV) REPLACEMENT using Magna Mitral Ease Valve Size 29MM;  Surgeon: Rexene Alberts, MD;  Location: [place];  Service: Open Heart Surgery;  Laterality: N/A;  . RIGHT/LEFT HEART CATH AND CORONARY ANGIOGRAPHY N/A 12/02/2017   Procedure: RIGHT/LEFT HEART CATH AND CORONARY ANGIOGRAPHY;  Surgeon:  Jettie Booze, MD;  Location: [place] [facility];  Service: Cardiovascular;  Laterality: N/A;  . TEE WITHOUT CARDIOVERSION N/A 12/09/2017   Procedure: TRANSESOPHAGEAL ECHOCARDIOGRAM (TEE);  Surgeon: Buford Dresser, MD;  Location: [facility] ENDOSCOPY;  Service: Cardiovascular;  Laterality: N/A;  . TEE WITHOUT CARDIOVERSION N/A 01/27/2018   Procedure: TRANSESOPHAGEAL ECHOCARDIOGRAM (TEE);  Surgeon: Rexene Alberts, MD;  Location: [place];  Service: Open  Heart Surgery;  Laterality: N/A;    ROS- all systems are reviewed and negative except as per HPI above  Current Outpatient Medications  Medication Sig Dispense Refill  . aspirin EC 81 MG tablet Take 81 mg by mouth daily.    Marland Kitchen atorvastatin (LIPITOR) 80 MG tablet TAKE 1 TABLET BY MOUTH  DAILY AT 6 PM. (Patient taking differently: Take 80 mg by mouth daily at 6 PM. ) 90 tablet 3  . carvedilol (COREG) 6.25 MG tablet Take 1 tablet (6.25 mg total) by mouth 2 (two) times daily with a meal. 60 tablet 0  . furosemide (LASIX) 40 MG tablet Take 1 tablet (40 mg total) by mouth daily. 30 tablet 0  . levocetirizine (XYZAL) 5 MG tablet Take 5 mg by mouth at bedtime.   3  . levothyroxine (SYNTHROID, LEVOTHROID) 75 MCG tablet TAKE 1 TABLET BY MOUTH  DAILY (Patient taking differently: Take 75 mcg by mouth daily before breakfast. ) 90 tablet 0  . montelukast (SINGULAIR) 10 MG tablet Take 10 mg by mouth at bedtime.   3  . Multiple Vitamin (MULTIVITAMIN WITH MINERALS) TABS tablet Take 1 tablet by mouth daily.    . Potassium Chloride ER 20 MEQ TBCR Take 20 mEq by mouth daily. While taking lasix. 30 tablet 0  . sacubitril-valsartan (ENTRESTO) 24-26 MG Take 1 tablet by mouth 2 (two) times daily. 60 tablet 0  . spironolactone (ALDACTONE) 25 MG tablet Take 0.5 tablets (12.5 mg total) by mouth daily. 30 tablet 0  . traZODone (DESYREL) 50 MG tablet Take 1 tablet (50 mg total) by mouth at bedtime. 90 tablet 3  . warfarin (COUMADIN) 5 MG tablet Take 1.5 tablets (7.5 mg total) by mouth daily. Take 7.64m daily except  5 mg on  every Monday and Friday, Or As Directed. (Patient taking differently: Take 5-7.5 mg by mouth See admin instructions. Take 7.544mdaily except  5 mg on  every Monday and Friday) 120 tablet 0   No current facility-administered medications for this visit.     Physical Exam: Vitals:   04/05/18 1646  BP: 132/80  Pulse: (!) 57  SpO2: 99%  Weight: 157 lb (71.2 kg)  Height: 5' 11"  (1.803 m)    GEN-  The patient is well appearing, alert and oriented x 3 today.   Head- normocephalic, atraumatic Eyes-  Sclera clear, conjunctiva pink Ears- hearing intact Oropharynx- clear Lungs- Clear to ausculation bilaterally, normal work of breathing Chest- pacemaker pocket is well healed Heart- Regular rate and rhythm, no murmurs, rubs or gallops, PMI not laterally displaced GI- soft, NT, ND, + BS Extremities- no clubbing, cyanosis, or edema  Pacemaker interrogation- reviewed in detail today,  See PACEART report  ekg tracing ordered today is personally reviewed and shows sinus with V pacing  Assessment and Plan:  1. Symptomatic complete heart block Normal pacemaker function See Pace Art report No changes today  2. Acute systolic dysfunction He has had progressive CHF (EF 40%-->25%) He is 2 months s/p CABG and MV replacement He is making slow recovery Continue medicine optimization with Dr  Hilty I will repeat echo in 3 months and consider device upgrade if EF has not recovered at that time.  3. Persistent afib Noted post operatively S/p recent cardioversion Remains in sinus rhythm currently On coumadin and doing well  4. Severe MR/ CAD S/p recent CABG and MV replacement (bioprosthetic) 01/2018  5. HTN Stable No change required today  carelink Return to see me in 3 months for consideration of device upgrade if his Ef has not recovered.  [name] MD, [facility] 04/05/2018 4:55 PM

## 2018-04-07 ENCOUNTER — Telehealth: Payer: Self-pay | Admitting: Internal Medicine

## 2018-04-07 NOTE — Telephone Encounter (Signed)
Please advise. [employer] calling. Thanks!

## 2018-04-07 NOTE — Telephone Encounter (Signed)
New Message    Tyler Hendrix is Calling for prior authorization for [employer]rest[employer]  Please call back

## 2018-04-07 NOTE — Telephone Encounter (Signed)
Prior auth for [employer] 24-26 bid completed on [email or web address] Key #[id number] Prior auth sent to [facility]. Awaiting determination.

## 2018-04-08 NOTE — Telephone Encounter (Signed)
[employer] [facility] Prior Auth for [employer] 24-26 bid approved. 04/07/18 through 04/08/19

## 2018-04-12 ENCOUNTER — Ambulatory Visit (INDEPENDENT_AMBULATORY_CARE_PROVIDER_SITE_OTHER): Payer: 59 | Admitting: Internal Medicine

## 2018-04-12 ENCOUNTER — Encounter

## 2018-04-12 ENCOUNTER — Encounter: Payer: Self-pay | Admitting: Internal Medicine

## 2018-04-12 VITALS — BP 124/78 | HR 77 | Ht 71.0 in | Wt 172.0 lb

## 2018-04-12 DIAGNOSIS — I480 Paroxysmal atrial fibrillation: Secondary | ICD-10-CM | POA: Diagnosis not present

## 2018-04-12 NOTE — Patient Instructions (Signed)
Medication Instructions:  Your Physician recommend you continue on your current medication as directed.    If you need a refill on your cardiac medications before your next appointment, please call your pharmacy.   Lab work: None  Testing/Procedures: None  Follow-Up: At [facility], you and your health needs are our priority.  As part of our continuing mission to provide you with exceptional heart care, we have created designated Provider Care Teams.  These Care Teams include your primary Cardiologist (physician) and Advanced Practice Providers (APPs -  Physician Assistants and Nurse Practitioners) who all work together to provide you with the care you need, when you need it. You will need a follow up appointment in 6 months.  Please call our office 2 months in advance to schedule this appointment.  You may see Kenneth C Hilty, MD or one of the following Advanced Practice Providers on your designated Care Team: Hao Meng, PA-C . Angela Duke, PA-C     

## 2018-04-12 NOTE — Progress Notes (Signed)
OFFICE NOTE  Chief Complaint:  No complaints  Primary Care Physician: Laurey Morale, MD  HPI:  Tyler Hendrix is a pleasant 63 year old male who was previously seen by Dr. Acie Fredrickson in September of this past year. He is an add on to my schedule today for acute symptoms of palpitations. He reports over the past several days she's had skipped heartbeats and higher heart rates. He says his heart rate is typically in the 60s however recently has been in the 80s. He's also felt some skipped beats and they were happening 4-5 times a minute. He denies any chest pain or worsening shortness of breath. He's never had a stress test or any coronary disease evaluation. He does have a history of Hodgkin's lymphoma and had chest wall radiation. In September he had an echocardiogram which showed normal systolic function and it was felt that he was doing fairly well. He continued to do exercise which she has recently without any significant symptoms. 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   Tyler Hendrix returns today for follow-up. Overall he is feeling fairly well. He reports his palpitations have improved somewhat but he still feeling him. While wearing the monitor he was noted to have significant ventricular ectopy including isolated PVCs, and ventricular trigeminy and quadrigeminy. His nuclear stress test was negative for ischemia but was not gated. He did have an echo last fall which showed an EF of 55% which is reassuring.   At the pleasure see Tyler Hendrix back in the office today for follow-up. Overall he thinks he is doing much better on the high-dose of metoprolol 25 mg daily. Although he's tolerating this with very few palpitations, he still has some breakthrough in the afternoon. He generally takes the medicine in the morning. His EKG today however does show a low heart rate of 48, with a short  period to sinus rhythm and a competing junctional rhythm. I've had concerns about underlying conduction system disease based on his EKG showing a right bundle branch block and now there is some evidence of a junctional bradycardia. Despite this he has a good energy level, denies chest pain and is able to maintain normal physical activity. His wife recently had their new baby about 2 weeks ago.  06/22/2015  Tyler Hendrix returns today for follow-up. He reports that he is overall done fairly well. He denies any worsening shortness of breath or fatigue. Heart rate remains in the upper 40s although he has very little ventricular ectopy and his EKG today. He denies any significant palpitations. I think we found a balance currently between low-dose beta blocker and his underlying conduction disease. Should he have more symptoms however he may need antiarrhythmic medication. I do not see any features that are concerning for pacemaker at this point. Blood pressure, however remains elevated and not at goal.  10/04/2015  Tyler Hendrix was seen back today in follow-up. He reports a marked improvement in his PVCs however over the past week he's felt slightly more short of breath with exertion. He did not voluntarily mention this yet required significant persistent questioning on my part to get an answer out of him. He also said that he might have a little pressure in his chest when he tried to exercise. He denied any pain, dizziness, presyncope or syncopal symptoms. Blood pressure is improved somewhat after the  addition of amlodipine. He's no longer having any PVCs however his EKG is abnormal today showing a new complete heart block. Heart rate is 44 with a sinus rhythm and wide QRS. There are inferior T-wave abnormalities which were previously seen on his EKG.  10/05/2015  Tyler Hendrix was seen today in follow-up of complete heart block. This was noted yesterday during his office visit. Heart rate was in the 40s and he was  somewhat diaphoretic. He denied any chest pain or shortness of breath but has felt fatigue recently. He had taken Toprol XL the night before and I advised him to not take any last evening and allow the medicine to wash out of his system. A today he is a heart rate has improved up to 88 and is in sinus rhythm with first-degree AV block and bifascicular block. He thinks he actually feels a little better today although was not diaphoretic. He said he didn't sleep well last night and had some diarrhea this morning. He does not noted change in energy level although he feels that he's had some recurrent palpitations.  01/01/2016  Tyler Hendrix returns today for follow-up. He underwent placement of a pacemaker which was uneventful for complete heart block. He says since that time his color is improved energy is better. Heart rate today is 61 however the pacer is set at a backup rate of 50. He was concerned that this may be still causing a little fatigue but I reassured him that I think that his heart rate is over that number and unlikely to be the cause. Blood pressure is well-controlled. The pacemaker site looks appropriate without any hematoma or effusion. He was recently seen by Almyra Deforest, PA-C , and was noted to have an elevated TSH. This is been elevated in the past but was as high as 9. Free T4 was ordered and those labs are pending. He did have radiation to his thyroid as part of treatment for Hodgkin's lymphoma.  04/18/2015  Tyler Hendrix was seen today in follow-up. Overall he seems to be doing very well. After discharge his amlodipine was increased to 10 mg from 5 mg. I rechecked his blood pressure today was 104/48, and he has noted that occasionally he gets a flushed feeling when exercising. I suspect he may be on too much medicine. We did discuss that amlodipine although does have a benefit for controlling blood pressure, has no intrinsic cardiovascular benefit. He does have normal renal function would probably  benefit from being on an ACE or ARB. He denies any chest pain or worsening shortness of breath. No problems with his pacemaker. He has been seeing an endocrinologist and his thyroid function has normalized on low-dose levothyroxine.  10/17/2016  Tyler Hendrix returns today for follow-up. He continues to do well. Recently he had his valsartan and switch to [place] losartan. He was feeling a little fatigue any decrease the dose from 20 mg to 10 mg daily. Blood pressure he said was running low for him. Today seems to be normal at 112/68. He is going to see his primary care provider about retesting of his testosterone. Apparently in the past this is been low as a result of chemotherapy and radiation. 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 He denies any chest pain. He's had no presyncope or syncopal symptoms. His pacemaker is been functioning appropriately on remote checks. This now almost one year since his two-vessel PCI.  04/24/2017  Tyler Hendrix returns today for follow-up.  Overall he is doing well.  He denies any chest pain or worsening shortness of breath.  Blood pressure was elevated today however came down to 128/56.  He remains a heart rate of 50 which is his backup pacer setting.  He is in complete heart block and testing is indicated that he is pacemaker dependent.  He does do remote checks.  He had two-vessel coronary artery disease and has been asymptomatic with that.  He is on aspirin and high-dose atorvastatin.  He has a physical exam coming up in a few weeks with his primary care provider who will get cholesterol testing at that time.  He is also on olmesartan for blood pressure.  04/12/2018  Tyler Hendrix is seen today in follow-up.  He had a very eventful last several months.  As above, he has a history of heart block with permanent pacemaker placement in 2017, CAD status post multivessel PCI in 2017, chronic combined  systolic and diastolic heart failure, status post bioprosthetic MVR and CABG x2 in December 2019, and new onset atrial fibrillation status post cardioversion on 03/26/2018.  He again presented recently with worsening shortness of breath and tachycardia and elective cardioversion was recommended.  In addition he was found to be in significant heart failure and was heavily diuresed.  Since discharge his weight has been down although he did gain back a few pounds.  Despite this, he has no evidence of any heart failure today.  In fact recently he has been getting lightheaded and dizzy and it was advised that he discontinue Lasix and potassium.  He is on Entresto.  His LVEF had decreased to 25% from the mid 40% range.  He is also on warfarin which was started initially for his mitral valve surgery, but has been continued because of A. fib.  There is a question of whether he has valvular A. fib.  PMHx:  Past Medical History:  Diagnosis Date  . Abnormal TSH   . Arthritis    ddd  . Atrial fibrillation and flutter ([place])    s/p DCCV 03/26/2018  . CAD in native artery    a. 11/2015: s/p 2 vessel PCI of the first diagonal branch of the LAD in the mid left circumflex with DES. // s/p CABG 01/2018  . Chronic combined systolic and diastolic CHF (congestive heart failure) ([place])    Echo 10/19 (prior to MVR, CABG):  Mild LVH, anteroseptal and apical hypokinesis, EF 40, grade 2 diastolic dysfunction, MAC, moderate to severe MR, moderate to severe LAE, normal RV SF, PASP 39, trivial effusion  . Complete heart block ([facility])    a. s/p Medtronic ppm 10/2015.  Marland Kitchen History of radiation therapy 2002  . Hodgkin disease ([place]) 2002   a. s/p chest radiation and chemotherapy  . Hypothyroidism   . Incidental pulmonary nodule 01/19/2018   Left apical opacity - possible scarring  . Mitral regurgitation    s/p bioprosthetic MV replacement 01/2018  . Pacemaker 10/2015   Medtronic  . PVC's (premature ventricular contractions)   .  Right bundle branch block   . 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 S/P mitral valve replacement with bioprosthetic valve 01/27/2018   29 mm [facility] Mitral stented bovine pericardial tissue valve    Past Surgical History:  Procedure Laterality Date  . CARDIAC CATHETERIZATION N/A 11/02/2015   Procedure: Left Heart Cath and  Coronary Angiography;  Surgeon: Sherren Mocha, MD;  Location: [place] [facility];  Service: Cardiovascular;  Laterality: N/A;  . CARDIAC CATHETERIZATION N/A 11/21/2015   Procedure: Coronary Stent Intervention;  Surgeon: Sherren Mocha, MD;  Location: [place] [facility];  Service: Cardiovascular;  Laterality: N/A;  . CARDIAC CATHETERIZATION  11/2017  . CARDIOVERSION N/A 03/26/2018   Procedure: CARDIOVERSION;  Surgeon: [name], MD;  Location: [facility] ENDOSCOPY;  Service: Cardiovascular;  Laterality: N/A;  . CORONARY ARTERY BYPASS GRAFT N/A 01/27/2018   Procedure: CORONARY ARTERY BYPASS GRAFTING (CABG) x two, using left internal mammary artery and right leg greater saphenous vein harvested endoscopically;  Surgeon: Rexene Alberts, MD;  Location: [place];  Service: Open Heart Surgery;  Laterality: N/A;  . EP IMPLANTABLE DEVICE N/A 11/02/2015   MDT Adivisa MRI conditional dual chamber pacemaker implanted by Dr Rayann Heman for complete heart block  . KNEE SURGERY Right 1972  . LUMBAR LAMINECTOMY  1996  . MITRAL VALVE REPLACEMENT N/A 01/27/2018   Procedure: MITRAL VALVE (MV) REPLACEMENT using Magna Mitral Ease Valve Size 29MM;  Surgeon: Rexene Alberts, MD;  Location: [place];  Service: Open Heart Surgery;  Laterality: N/A;  . RIGHT/LEFT HEART CATH AND CORONARY ANGIOGRAPHY N/A 12/02/2017   Procedure: RIGHT/LEFT HEART CATH AND CORONARY ANGIOGRAPHY;  Surgeon: Jettie Booze, MD;  Location: [place] [facility];  Service: Cardiovascular;  Laterality: N/A;  . TEE WITHOUT CARDIOVERSION N/A 12/09/2017   Procedure: TRANSESOPHAGEAL  ECHOCARDIOGRAM (TEE);  Surgeon: Buford Dresser, MD;  Location: [facility] ENDOSCOPY;  Service: Cardiovascular;  Laterality: N/A;  . TEE WITHOUT CARDIOVERSION N/A 01/27/2018   Procedure: TRANSESOPHAGEAL ECHOCARDIOGRAM (TEE);  Surgeon: Rexene Alberts, MD;  Location: [place];  Service: Open Heart Surgery;  Laterality: N/A;    FAMHx:  Family History  Problem Relation Age of Onset  . Cancer Other        prostate father hx  . Heart disease Mother   . Heart disease Father   . Colon cancer Neg Hx     SOCHx:   reports that he has never smoked. He has never used smokeless tobacco. He reports current alcohol use of about 5.0 standard drinks of alcohol per week. He reports that he does not use drugs.  ALLERGIES:  Allergies  Allergen Reactions  . Other     Pt reported allergic to dust mice that causes of sneezing, runny nose    ROS: Pertinent items noted in HPI and remainder of comprehensive ROS otherwise negative.  HOME MEDS: Current Outpatient Medications  Medication Sig Dispense Refill  . aspirin EC 81 MG tablet Take 81 mg by mouth daily.    Marland Kitchen atorvastatin (LIPITOR) 80 MG tablet TAKE 1 TABLET BY MOUTH  DAILY AT 6 PM. (Patient taking differently: Take 80 mg by mouth daily at 6 PM. ) 90 tablet 3  . carvedilol (COREG) 6.25 MG tablet Take 1 tablet (6.25 mg total) by mouth 2 (two) times daily with a meal. 60 tablet 0  . levocetirizine (XYZAL) 5 MG tablet Take 5 mg by mouth at bedtime.   3  . levothyroxine (SYNTHROID, LEVOTHROID) 75 MCG tablet TAKE 1 TABLET BY MOUTH  DAILY (Patient taking differently: Take 75 mcg by mouth daily before breakfast. ) 90 tablet 0  . montelukast (SINGULAIR) 10 MG tablet Take 10 mg by mouth at bedtime.   3  . Multiple Vitamin (MULTIVITAMIN WITH MINERALS) TABS tablet Take 1 tablet by mouth daily.    . sacubitril-valsartan (ENTRESTO) 24-26 MG Take  1 tablet by mouth 2 (two) times daily. 60 tablet 0  . spironolactone (ALDACTONE) 25 MG tablet Take 0.5 tablets (12.5 mg  total) by mouth daily. 30 tablet 0  . traZODone (DESYREL) 50 MG tablet Take 1 tablet (50 mg total) by mouth at bedtime. 90 tablet 3  . warfarin (COUMADIN) 5 MG tablet Take 1.5 tablets (7.5 mg total) by mouth daily. Take 7.86m daily except  5 mg on  every Monday and Friday, Or As Directed. (Patient taking differently: Take 5-7.5 mg by mouth See admin instructions. Take 7.563mdaily except  5 mg on  every Monday and Friday) 120 tablet 0   No current facility-administered medications for this visit.     LABS/IMAGING: No results found for this or any previous visit (from the past 48 hour(s)). No results found.  WEIGHTS: Wt Readings from Last 3 Encounters:  04/12/18 172 lb (78 kg)  04/05/18 157 lb (71.2 kg)  03/29/18 164 lb 1.6 oz (74.4 kg)    VITALS: BP 124/78   Pulse 77   Ht _0  (1.803 m)   Wt 172 lb (78 kg)   BMI 23.99 kg/m   EXAM: General appearance: alert and no distress Neck: no carotid bruit, no JVD and thyroid not enlarged, symmetric, no tenderness/mass/nodules Lungs: clear to auscultation bilaterally Heart: regular rate and rhythm, S1, S2 normal, no murmur, click, rub or gallop Abdomen: soft, non-tender; bowel sounds normal; no masses,  no organomegaly Extremities: extremities normal, atraumatic, no cyanosis or edema Pulses: 2+ and symmetric Skin: Skin color, texture, turgor normal. No rashes or lesions Neurologic: Grossly normal Psych: Pleasant  EKG: A sensed V paced rhythm at 77-personally reviewed  ASSESSMENT: 1. CAD - s/p PCI to the D1 and mid LCX with DES (11/2015) 2. Status post CABG x2 (LIMA to LAD, SVG to RCA) and bioprosthetic MVR -29 mm Edwards magna bovine bioprosthesis (01/2018) 3. Complete heart block-s/p Medtronic pacemaker (10/2015) 4. Acute on chronic systolic congestive heart failure with EF as low as 25% (2019) 5. Palpitations - PVCs including trigeminy and quadrigeminy, generally improved on b-blocker 6. Abnormal EKG with bifascicular block and  competing junctional bradycardia 7. History of chest wall radiation for Hodgkin's lymphoma 8. Essential hypertension 9. Hypothyroidism 10. Paroxysmal atrial fibrillation on warfarin  PLAN: 1.   Mr. TeEngelmanns finally doing better now after bypass, acute on chronic systolic congestive heart failure with EF as low as 25%.  Recently he is felt better after cardioversion to get him back into sinus rhythm.  He was diuresed and appears euvolemic on exam today.  EKG shows he is in an atrial paced rhythm.  He is anticoagulated on warfarin. He will need a repeat INR in 1-2 weeks.  Since he recently had weakness and dizziness, which improved after stopping furosemide, I am hesitant to further increase his Entresto at this time.  He scheduled for repeat echo this summer and will follow up with Dr. AlRayann Hemanfterwards.  He asked about how long he would need to stay on warfarin, but I suspect that this will be indefinitely.  Follow-up in 6 months.  KePixie CasinoMD, FAArizona State Forensic HospitalFASkidmoreirector of the [facility]  Card[facility] Reduction [facility] Diplomate of the American Board of Clinical Lipidology Attending Cardiologist  Direct Dial: [phone]: [phone]:  [email or web address] 04/12/2018, 3:12 PM

## 2018-04-13 ENCOUNTER — Telehealth: Payer: Self-pay | Admitting: *Deleted

## 2018-04-13 NOTE — Telephone Encounter (Signed)
Left message for patient to call and schedule over due INR appointment

## 2018-04-16 ENCOUNTER — Ambulatory Visit (INDEPENDENT_AMBULATORY_CARE_PROVIDER_SITE_OTHER): Payer: 59 | Admitting: Internal Medicine

## 2018-04-16 ENCOUNTER — Encounter: Payer: Self-pay | Admitting: Internal Medicine

## 2018-04-16 VITALS — BP 138/88 | HR 56 | Ht 71.0 in | Wt 173.0 lb

## 2018-04-16 DIAGNOSIS — E039 Hypothyroidism, unspecified: Secondary | ICD-10-CM | POA: Diagnosis not present

## 2018-04-16 MED ORDER — LEVOTHYROXINE SODIUM 75 MCG PO TABS: 75.0000 ug | ORAL_TABLET | Freq: Every day | ORAL | 3 refills | Status: DC

## 2018-04-16 NOTE — Patient Instructions (Addendum)
Please continue Levothyroxine 75 mcg daily.  Take the thyroid hormone every day, with water, at least 30 minutes before breakfast, separated by at least 4 hours from: - acid reflux medications - calcium - iron - multivitamins  Move b'fast >30 min after and Multivitamins >4h after Levothyroxine  Please come back for labs in 1.5 months.  Please come back for a follow-up appointment in 1 year.

## 2018-04-16 NOTE — Progress Notes (Signed)
Patient ID: Tyler Hendrix, male   DOB: 02-Mar-1955, 63 y.o.   MRN: [id number]    HPI  Tyler Hendrix is a 63 y.o.-year-old male, returning for follow-up for hypothyroidism.  He was lost for follow-up after our last visit in 06/2016.  Since last visit, he had multiple admissions for MV replacement, CABG, then admitted with acute on chronic CHF on 03/27/2018. EF now 25-30%. He also has Afib and had cardioversion since last visit.  During his admission from 03/2018, his TSH was found to be slightly high.   Reviewed history: Pt. has been dx with hypothyroidism in ~2010. He has been found to have a high TSH during investigation for AVB.   He feels better after restarting levothyroxine.  Reviewed patient's TFTs: Lab Results  Component Value Date   TSH 4.630 (H) 03/27/2018   TSH 2.50 05/12/2017   TSH 4.10 09/11/2016   TSH 5.55 (H) 07/03/2016   TSH 5.55 (H) 04/16/2016   TSH 9.38 (H) 01/01/2016   TSH 9.07 (H) 10/29/2015   TSH 4.76 (H) 04/20/2014   TSH 4.82 10/17/2011   TSH 5.14 05/20/2010   FREET4 0.96 05/12/2017   FREET4 1.14 09/11/2016   FREET4 0.79 07/03/2016   FREET4 0.87 04/16/2016   FREET4 1.0 01/01/2016    Pt is on levothyroxine 75 Mcg daily (dose increased at last visit), taken: - in am - after b'fast now - no Ca, Fe, PPIs - + Multivitamins at the same time with LT4 - not on Biotin  Pt denies: - feeling nodules in neck - hoarseness - dysphagia - choking - SOB with lying down  Of note, he does have a history of radiation treatment to head and neck area - 2002.  He has no FH of thyroid disorders. No FH of thyroid cancer. No h/o radiation tx to head or neck.  No herbal supplements. No Biotin use. No recent steroids use.   Pt. 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 He had RxTx and ChTx in 2002 for Hodgkin Lymphoma.  ROS: Constitutional: + Weight loss, no fatigue, + subjective hyperthermia, no subjective hypothermia Eyes: no  blurry vision, no xerophthalmia ENT: no sore throat, + see HPI Cardiovascular: no CP/+ shortness of breath, + palpitations, + swelling in legs Respiratory: + Cough/+ shortness of breath, + wheezing Gastrointestinal: no N/no V/no D/no C/no acid reflux Musculoskeletal: no muscle aches/no joint aches Skin: no rashes, no hair loss Neurological: no tremors/no numbness/no tingling/no dizziness  I reviewed pt's medications, allergies, PMH, social hx, family hx, and changes were documented in the history of present illness. Otherwise, unchanged from my initial visit note.  Past Medical History:  Diagnosis Date  . Abnormal TSH   . Arthritis    ddd  . Atrial fibrillation and flutter (HCC)    s/p DCCV 03/26/2018  . CAD in native artery    a. 11/2015: s/p 2 vessel PCI of the first diagonal branch of the LAD in the mid left circumflex with DES. // s/p CABG 01/2018  . Chronic combined systolic and diastolic CHF (congestive heart failure) (HCC)    Echo 10/19 (prior to MVR, CABG):  Mild LVH, anteroseptal and apical hypokinesis, EF 40, grade 2 diastolic dysfunction, MAC, moderate to severe MR, moderate to severe LAE, normal RV SF, PASP 39, trivial effusion  . Complete heart block (HCC)    a. s/p Medtronic ppm 10/2015.  Marland Kitchen History of radiation therapy 2002  . Hodgkin disease (HCC)  2002   a. s/p chest radiation and chemotherapy  . Hypothyroidism   . Incidental pulmonary nodule 01/19/2018   Left apical opacity - possible scarring  . Mitral regurgitation    s/p bioprosthetic MV replacement 01/2018  . Pacemaker 10/2015   Medtronic  . PVC's (premature ventricular contractions)   . Right bundle branch block   . 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 S/P mitral valve replacement with bioprosthetic valve 01/27/2018   29 mm [facility] Mitral stented bovine pericardial tissue valve   Past Surgical History:  Procedure Laterality Date  . CARDIAC CATHETERIZATION N/A  11/02/2015   Procedure: Left Heart Cath and Coronary Angiography;  Surgeon: Tonny Bollman, MD;  Location: [facility] INVASIVE CV LAB;  Service: Cardiovascular;  Laterality: N/A;  . CARDIAC CATHETERIZATION N/A 11/21/2015   Procedure: Coronary Stent Intervention;  Surgeon: Tonny Bollman, MD;  Location: [facility] INVASIVE CV LAB;  Service: Cardiovascular;  Laterality: N/A;  . CARDIAC CATHETERIZATION  11/2017  . CARDIOVERSION N/A 03/26/2018   Procedure: CARDIOVERSION;  Surgeon: Parke Poisson, MD;  Location: [facility] ENDOSCOPY;  Service: Cardiovascular;  Laterality: N/A;  . CORONARY ARTERY BYPASS GRAFT N/A 01/27/2018   Procedure: CORONARY ARTERY BYPASS GRAFTING (CABG) x two, using left internal mammary artery and right leg greater saphenous vein harvested endoscopically;  Surgeon: Purcell Nails, MD;  Location: [facility] OR;  Service: Open Heart Surgery;  Laterality: N/A;  . EP IMPLANTABLE DEVICE N/A 11/02/2015   MDT Adivisa MRI conditional dual chamber pacemaker implanted by Dr Johney Frame for complete heart block  . KNEE SURGERY Right 1972  . LUMBAR LAMINECTOMY  1996  . MITRAL VALVE REPLACEMENT N/A 01/27/2018   Procedure: MITRAL VALVE (MV) REPLACEMENT using Magna Mitral Ease Valve Size ;  Surgeon: Purcell Nails, MD;  Location: [facility] OR;  Service: Open Heart Surgery;  Laterality: N/A;  . RIGHT/LEFT HEART CATH AND CORONARY ANGIOGRAPHY N/A 12/02/2017   Procedure: RIGHT/LEFT HEART CATH AND CORONARY ANGIOGRAPHY;  Surgeon: Corky Crafts, MD;  Location: [facility] INVASIVE CV LAB;  Service: Cardiovascular;  Laterality: N/A;  . TEE WITHOUT CARDIOVERSION N/A 12/09/2017   Procedure: TRANSESOPHAGEAL ECHOCARDIOGRAM (TEE);  Surgeon: Jodelle Red, MD;  Location: [facility] ENDOSCOPY;  Service: Cardiovascular;  Laterality: N/A;  . TEE WITHOUT CARDIOVERSION N/A 01/27/2018   Procedure: TRANSESOPHAGEAL ECHOCARDIOGRAM (TEE);  Surgeon: Purcell Nails, MD;  Location: [facility] OR;  Service: Open Heart Surgery;  Laterality: N/A;   Social History    Socioeconomic History  . Marital status: Married    Spouse name: Not on file  . Number of children: 2  . Years of education: Masters  . Highest education level: Not on file  Occupational History  . Occupation: Clinical research associate    Comment: [employer]  Social Needs  . Financial resource strain: Not on file  . Food insecurity:    Worry: Not on file    Inability: Not on file  . Transportation needs:    Medical: Not on file    Non-medical: Not on file  Tobacco Use  . Smoking status: Never Smoker  . Smokeless tobacco: Never Used  Substance and Sexual Activity  . Alcohol use: Yes    Alcohol/week: 5.0 standard drinks    Types: 5 Glasses of wine per week  . Drug use: No  . Sexual activity: Not on file  Lifestyle  . Physical activity:    Days per week: Not on file  Minutes per session: Not on file  . Stress: Not on file  Relationships  . Social connections:    Talks on phone: Not on file    Gets together: Not on file    Attends religious service: Not on file    Active member of club or organization: Not on file    Attends meetings of clubs or organizations: Not on file    Relationship status: Not on file  . Intimate partner violence:    Fear of current or ex partner: Not on file    Emotionally abused: Not on file    Physically abused: Not on file    Forced sexual activity: Not on file  Other Topics Concern  . 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 aspirin EC 81 MG tablet Take 81 mg by mouth daily.    Marland Kitchen atorvastatin (LIPITOR) 80 MG tablet TAKE 1 TABLET BY MOUTH  DAILY AT 6 PM. (Patient taking differently: Take 80 mg by mouth daily at 6 PM. ) 90 tablet 3  . carvedilol (COREG) 6.25 MG tablet Take 1 tablet (6.25 mg total) by mouth 2 (two) times daily with a meal. 60 tablet 0  . levocetirizine (XYZAL) 5 MG tablet Take 5 mg by mouth at  bedtime.   3  . levothyroxine (SYNTHROID, LEVOTHROID) 75 MCG tablet TAKE 1 TABLET BY MOUTH  DAILY (Patient taking differently: Take 75 mcg by mouth daily before breakfast. ) 90 tablet 0  . montelukast (SINGULAIR) 10 MG tablet Take 10 mg by mouth at bedtime.   3  . Multiple Vitamin (MULTIVITAMIN WITH MINERALS) TABS tablet Take 1 tablet by mouth daily.    . sacubitril-valsartan (ENTRESTO) 24-26 MG Take 1 tablet by mouth 2 (two) times daily. 60 tablet 0  . spironolactone (ALDACTONE) 25 MG tablet Take 0.5 tablets (12.5 mg total) by mouth daily. 30 tablet 0  . traZODone (DESYREL) 50 MG tablet Take 1 tablet (50 mg total) by mouth at bedtime. 90 tablet 3  . warfarin (COUMADIN) 5 MG tablet Take 1.5 tablets (7.5 mg total) by mouth daily. Take 7.5mg  daily except  5 mg on  every Monday and Friday, Or As Directed. (Patient taking differently: Take 5-7.5 mg by mouth See admin instructions. Take 7.5mg  daily except  5 mg on  every Monday and Friday) 120 tablet 0   No current facility-administered medications on file prior to visit.    Allergies  Allergen Reactions  . Other     Pt reported allergic to dust mice that causes of sneezing, runny nose   Family History  Problem Relation Age of Onset  . Cancer Other        prostate father hx  . Heart disease Mother   . Heart disease Father   . Colon cancer Neg Hx     PE: BP 138/88   Pulse (!) 56   Ht 5\' 11"  (1.803 m)   Wt 173 lb (78.5 kg)   SpO2 99%   BMI 24.13 kg/m  Wt Readings from Last 3 Encounters:  04/16/18 173 lb (78.5 kg)  04/12/18 172 lb (78 kg)  04/05/18 157 lb (71.2 kg)   Constitutional: Normal weight, in NAD Eyes: PERRLA, EOMI, no exophthalmos ENT: moist mucous membranes, no thyromegaly, no cervical lymphadenopathy Cardiovascular: RRR, No MRG Respiratory: CTA B Gastrointestinal: abdomen soft, NT, ND, BS+ Musculoskeletal: no  deformities, strength intact in all 4 Skin: moist, warm, no rashes Neurological: no tremor with outstretched  hands, DTR normal in all 4   ASSESSMENT: 1. Subclinical Hypothyroidism - He does not appear to have a goiter, thyroid nodules, or neck compression symptoms  PLAN:  1. Patient with longstanding subclinical hypothyroidism, now on low-dose levothyroxine therapy. - latest thyroid labs reviewed with pt >> slightly higher at last check in 03/2018 (possibly due to incorrect levothyroxine administration) - he continues on LT4 75 mcg daily (dose increased at last visit) - pt feels good on this dose, but he had an eventful year, with multiple cardiac problems.  He does have fatigue. - we discussed about taking the thyroid hormone every day, with water, >30 minutes before breakfast, separated by >4 hours from acid reflux medications, calcium, iron, multivitamins. Pt. was taking this correctly, however, he changed the way he took levothyroxine since last visit: He moved it after breakfast and he also takes it along with a multivitamin.  I explained that he is not absorbing all the levothyroxine now and discussed about moving breakfast 30 minutes and multivitamins at least 4 hours after levothyroxine. - will recheck thyroid tests in 1.5 months after the above change: TSH and fT4 - I will see him back in a year, but possibly sooner for labs  Orders Placed This Encounter  Procedures  . TSH  . T4, free   - time spent with the patient: 15 min, of which >50% was spent in obtaining information about his symptoms, reviewing his previous labs, evaluations, and treatments, counseling him about his condition (please see the discussed topics above), and developing a plan to further investigate and treat it; he had a number of questions which I addressed.  Carlus Pavlov, MD PhD [facility] Endocrinology

## 2018-04-19 ENCOUNTER — Ambulatory Visit (INDEPENDENT_AMBULATORY_CARE_PROVIDER_SITE_OTHER): Payer: 59 | Admitting: *Deleted

## 2018-04-19 DIAGNOSIS — Z953 Presence of xenogenic heart valve: Secondary | ICD-10-CM

## 2018-04-19 DIAGNOSIS — Z7901 Long term (current) use of anticoagulants: Secondary | ICD-10-CM

## 2018-04-19 LAB — POCT INR: INR: 1.7 — AB (ref 2.0–3.0)

## 2018-04-19 NOTE — Patient Instructions (Addendum)
Description   Today take an extra 2.5mg ,  then change your dose to 7.5mg  daily except 5mg  on Mondays.  Repeat INR in 2 weeks.

## 2018-04-23 ENCOUNTER — Ambulatory Visit
Admission: RE | Admit: 2018-04-23 | Discharge: 2018-04-23 | Disposition: A | Discharge status: Home / Self Care | Payer: 59 | Source: Ambulatory Visit | Attending: Thoracic Surgery (Cardiothoracic Vascular Surgery) | Admitting: Thoracic Surgery (Cardiothoracic Vascular Surgery)

## 2018-04-23 DIAGNOSIS — R911 Solitary pulmonary nodule: Secondary | ICD-10-CM

## 2018-04-26 ENCOUNTER — Telehealth: Payer: Self-pay | Admitting: Internal Medicine

## 2018-04-26 MED ORDER — SPIRONOLACTONE 25 MG PO TABS: 12.5000 mg | ORAL_TABLET | Freq: Every day | ORAL | 1 refills | Status: DC

## 2018-04-26 NOTE — Telephone Encounter (Signed)
°*  STAT* If patient is at the pharmacy, call can be transferred to refill team.   1. Which medications need to be refilled? (please list name of each medication and dose if known) spironolactone (ALDACTONE) 25 MG tablet  2. Which pharmacy/location (including street and city if local pharmacy) is medication to be sent to? [place], [place] - [address] AT [place]  3. Do they need a 30 day or 90 day supply? 90 days

## 2018-04-26 NOTE — Telephone Encounter (Signed)
Patient notified and voiced understanding.

## 2018-04-30 ENCOUNTER — Telehealth: Payer: Self-pay

## 2018-04-30 DIAGNOSIS — J3089 Other allergic rhinitis: Secondary | ICD-10-CM | POA: Diagnosis not present

## 2018-04-30 DIAGNOSIS — R05 Cough: Secondary | ICD-10-CM | POA: Diagnosis not present

## 2018-04-30 NOTE — Telephone Encounter (Signed)
Left voicemail to postpone appt for 3 months. Okay for nurse triage to disclose

## 2018-05-03 ENCOUNTER — Ambulatory Visit: Payer: 59 | Admitting: Family Medicine

## 2018-05-03 ENCOUNTER — Telehealth: Payer: Self-pay

## 2018-05-03 NOTE — Telephone Encounter (Signed)
1. Do you currently have a fever? NO (yes = cancel and refer to pcp for e-visit) 2. Have you recently travelled on a cruise, internationally, or to [place], [place], [place], [place], [place], or [place], [place] [employer]) ? NO (yes = cancel, stay home, monitor symptoms, and contact pcp or initiate e-visit if symptoms develop) 3. Have you been in contact with someone that is currently pending confirmation of Covid19 testing or has been confirmed to have the [place] virus?  YES (yes = cancel, stay home, away from tested individual, monitor symptoms, and contact pcp or initiate e-visit if symptoms develop) 4. Are you currently experiencing fatigue or cough? NO (yes = pt should be prepared to have a mask placed at the time of their visit).  Pt. Advised that we are restricting visitors at this time and anyone present in the vehicle should meet the above criteria as well. Advised that visit will be at curbside for finger stick ONLY and will receive call with instructions. Pt also advised to please bring own pen for signature of arrival document.

## 2018-05-05 ENCOUNTER — Ambulatory Visit: Payer: 59 | Admitting: Internal Medicine

## 2018-05-05 DIAGNOSIS — Z1283 Encounter for screening for malignant neoplasm of skin: Secondary | ICD-10-CM | POA: Diagnosis not present

## 2018-05-05 DIAGNOSIS — L821 Other seborrheic keratosis: Secondary | ICD-10-CM | POA: Diagnosis not present

## 2018-05-05 DIAGNOSIS — L570 Actinic keratosis: Secondary | ICD-10-CM | POA: Diagnosis not present

## 2018-05-05 DIAGNOSIS — X32XXXD Exposure to sunlight, subsequent encounter: Secondary | ICD-10-CM | POA: Diagnosis not present

## 2018-05-08 ENCOUNTER — Other Ambulatory Visit: Payer: Self-pay | Admitting: Internal Medicine

## 2018-05-10 ENCOUNTER — Telehealth: Payer: Self-pay

## 2018-05-10 NOTE — Telephone Encounter (Signed)

## 2018-05-11 ENCOUNTER — Ambulatory Visit (INDEPENDENT_AMBULATORY_CARE_PROVIDER_SITE_OTHER): Payer: 59 | Admitting: Pharmacist

## 2018-05-11 ENCOUNTER — Other Ambulatory Visit: Payer: Self-pay

## 2018-05-11 DIAGNOSIS — Z953 Presence of xenogenic heart valve: Secondary | ICD-10-CM

## 2018-05-11 DIAGNOSIS — Z7901 Long term (current) use of anticoagulants: Secondary | ICD-10-CM

## 2018-05-11 LAB — POCT INR: INR: 1.4 — AB (ref 2.0–3.0)

## 2018-05-23 ENCOUNTER — Other Ambulatory Visit: Payer: Self-pay | Admitting: Cardiology

## 2018-05-24 ENCOUNTER — Telehealth: Payer: Self-pay | Admitting: *Deleted

## 2018-05-24 ENCOUNTER — Ambulatory Visit: Payer: 59 | Admitting: Thoracic Surgery (Cardiothoracic Vascular Surgery)

## 2018-05-24 ENCOUNTER — Telehealth: Payer: Self-pay

## 2018-05-24 NOTE — Telephone Encounter (Signed)
Yes .. it was discontinued. Refuse refill. Thanks.  Dr. Lemmie Evens

## 2018-05-24 NOTE — Telephone Encounter (Signed)
[place] [facility] is requesting refill for Potassium CL 20 meq. Office note from 04/12/18 pt was advised to stop taking. Please advise. Thanks

## 2018-05-24 NOTE — Telephone Encounter (Signed)

## 2018-05-25 ENCOUNTER — Telehealth: Payer: Self-pay | Admitting: *Deleted

## 2018-05-25 ENCOUNTER — Ambulatory Visit (INDEPENDENT_AMBULATORY_CARE_PROVIDER_SITE_OTHER): Payer: 59

## 2018-05-25 ENCOUNTER — Other Ambulatory Visit: Payer: Self-pay

## 2018-05-25 DIAGNOSIS — Z953 Presence of xenogenic heart valve: Secondary | ICD-10-CM

## 2018-05-25 DIAGNOSIS — Z7901 Long term (current) use of anticoagulants: Secondary | ICD-10-CM | POA: Diagnosis not present

## 2018-05-25 LAB — POCT INR: INR: 1.6 — AB (ref 2.0–3.0)

## 2018-05-25 NOTE — Patient Instructions (Signed)
Description   Called spoke with pt, advised to take an extra 2.5mg  (since already took 7.5mg ), then start taking 7.5mg  daily except 10mg  on Tuesdays and Saturdays.  Repeat INR in 2 weeks.

## 2018-05-25 NOTE — Telephone Encounter (Signed)
Spoke with [place], [place] [phone] cancelled rx refill for Potassium CL 20 meq. Per Dr. Debara Pickett epic message.

## 2018-05-31 ENCOUNTER — Ambulatory Visit: Payer: 59 | Admitting: Thoracic Surgery (Cardiothoracic Vascular Surgery)

## 2018-06-07 ENCOUNTER — Telehealth: Payer: Self-pay

## 2018-06-07 NOTE — Telephone Encounter (Signed)

## 2018-06-08 ENCOUNTER — Ambulatory Visit (INDEPENDENT_AMBULATORY_CARE_PROVIDER_SITE_OTHER): Payer: 59 | Admitting: Pharmacist

## 2018-06-08 ENCOUNTER — Other Ambulatory Visit: Payer: Self-pay

## 2018-06-08 DIAGNOSIS — Z7901 Long term (current) use of anticoagulants: Secondary | ICD-10-CM

## 2018-06-08 DIAGNOSIS — Z953 Presence of xenogenic heart valve: Secondary | ICD-10-CM | POA: Diagnosis not present

## 2018-06-08 LAB — POCT INR: INR: 2.2 (ref 2.0–3.0)

## 2018-06-14 ENCOUNTER — Telehealth (INDEPENDENT_AMBULATORY_CARE_PROVIDER_SITE_OTHER): Payer: 59 | Admitting: Thoracic Surgery (Cardiothoracic Vascular Surgery)

## 2018-06-14 DIAGNOSIS — Z953 Presence of xenogenic heart valve: Secondary | ICD-10-CM

## 2018-06-14 DIAGNOSIS — Z951 Presence of aortocoronary bypass graft: Secondary | ICD-10-CM

## 2018-06-14 DIAGNOSIS — R911 Solitary pulmonary nodule: Secondary | ICD-10-CM

## 2018-06-14 NOTE — Progress Notes (Signed)
[address]       [place], [address]             [facility] VIRTUAL OFFICE NOTE  Referring Provider Tawana Scale, MD Primary Cardiologist is Pixie Casino, MD PCP is Laurey Morale, MD   HPI:  I spoke with Tyler Hendrix (DOB November 01, 1955 ) via telephone on 06/14/2018 at 12:12 PM and verified that I was speaking with the correct person using more than one form of identification.  We discussed the reason(s) for conducting our visit virtually instead of in-person.  The patient expressed understanding the circumstances and agreed to proceed as described.   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  The patient's early postoperative recovery in the hospital was uneventful and he was discharged home on the fifth postoperative day.  He was last seen here in our office on February 22, 2018 and in February he was hospitalized for several days with acute exacerbation of chronic combined systolic and diastolic congestive heart failure with atrial fibrillation.  He was cardioverted back to sinus rhythm and diuresed 11 pounds during his hospitalization.  Echocardiogram performed at that time revealed decreased left ventricular systolic function with ejection fraction estimated 25 to 30%.  There was reportedly a "moderate" pericardial effusion.  The bioprosthetic tissue valve in the mitral position was functioning normally with "trivial" central mitral regurgitation.  The patient was last seen in follow-up by  Dr. Debara Pickett on April 12, 2018 at which time he was doing fairly well.  I telephoned the patient today and he reports that he is doing very well.  He has remained clinically stable since he was discharged from the hospital last February and overall he continues to improve.  He is jogging and exercising on a regular basis.  He is able to do 25 push-ups.  He has no pain in his chest.  Exercise tolerance continues to improve.  He no longer has any symptoms similar to that which he experienced when he was hospitalized last February.  Overall he is pleased with his progress.    Current Outpatient Medications  Medication Sig Dispense Refill   aspirin EC 81 MG tablet Take 81 mg by mouth daily.     atorvastatin (LIPITOR) 80 MG tablet TAKE 1 TABLET BY MOUTH  DAILY AT 6 PM. (Patient taking differently: Take 80 mg by mouth daily at 6 PM. ) 90 tablet 3   carvedilol (COREG) 6.25 MG tablet Take 1 tablet (6.25 mg total) by mouth 2 (two) times daily with a meal. 60 tablet 0   levocetirizine (XYZAL) 5 MG tablet Take 5 mg by mouth at bedtime.   3   levothyroxine (SYNTHROID, LEVOTHROID) 75 MCG tablet Take 1 tablet (75 mcg total) by mouth daily before breakfast. 45 tablet 3   montelukast (SINGULAIR) 10 MG tablet Take 10 mg by mouth at bedtime.   3   Multiple Vitamin (MULTIVITAMIN WITH MINERALS) TABS tablet Take 1 tablet by mouth daily.     Potassium Chloride ER 20 MEQ TBCR TAKE 1  [address]       [place], [address]             [facility] VIRTUAL OFFICE NOTE  Referring Provider Tawana Scale, MD Primary Cardiologist is Pixie Casino, MD PCP is Laurey Morale, MD   HPI:  I spoke with Tyler Hendrix (DOB November 01, 1955 ) via telephone on 06/14/2018 at 12:12 PM and verified that I was speaking with the correct person using more than one form of identification.  We discussed the reason(s) for conducting our visit virtually instead of in-person.  The patient expressed understanding the circumstances and agreed to proceed as described.   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  The patient's early postoperative recovery in the hospital was uneventful and he was discharged home on the fifth postoperative day.  He was last seen here in our office on February 22, 2018 and in February he was hospitalized for several days with acute exacerbation of chronic combined systolic and diastolic congestive heart failure with atrial fibrillation.  He was cardioverted back to sinus rhythm and diuresed 11 pounds during his hospitalization.  Echocardiogram performed at that time revealed decreased left ventricular systolic function with ejection fraction estimated 25 to 30%.  There was reportedly a "moderate" pericardial effusion.  The bioprosthetic tissue valve in the mitral position was functioning normally with "trivial" central mitral regurgitation.  The patient was last seen in follow-up by  Dr. Debara Pickett on April 12, 2018 at which time he was doing fairly well.  I telephoned the patient today and he reports that he is doing very well.  He has remained clinically stable since he was discharged from the hospital last February and overall he continues to improve.  He is jogging and exercising on a regular basis.  He is able to do 25 push-ups.  He has no pain in his chest.  Exercise tolerance continues to improve.  He no longer has any symptoms similar to that which he experienced when he was hospitalized last February.  Overall he is pleased with his progress.    Current Outpatient Medications  Medication Sig Dispense Refill   aspirin EC 81 MG tablet Take 81 mg by mouth daily.     atorvastatin (LIPITOR) 80 MG tablet TAKE 1 TABLET BY MOUTH  DAILY AT 6 PM. (Patient taking differently: Take 80 mg by mouth daily at 6 PM. ) 90 tablet 3   carvedilol (COREG) 6.25 MG tablet Take 1 tablet (6.25 mg total) by mouth 2 (two) times daily with a meal. 60 tablet 0   levocetirizine (XYZAL) 5 MG tablet Take 5 mg by mouth at bedtime.   3   levothyroxine (SYNTHROID, LEVOTHROID) 75 MCG tablet Take 1 tablet (75 mcg total) by mouth daily before breakfast. 45 tablet 3   montelukast (SINGULAIR) 10 MG tablet Take 10 mg by mouth at bedtime.   3   Multiple Vitamin (MULTIVITAMIN WITH MINERALS) TABS tablet Take 1 tablet by mouth daily.     Potassium Chloride ER 20 MEQ TBCR TAKE 1  TABLET BY MOUTH EVERY DAY WHILE TAKING FUROSEMIDE 30 tablet 2   sacubitril-valsartan (ENTRESTO) 24-26 MG Take 1 tablet by mouth 2 (two) times daily. 60 tablet 0   spironolactone (ALDACTONE) 25 MG tablet Take 0.5 tablets (12.5 mg total) by mouth daily. 90 tablet 1   traZODone (DESYREL) 50 MG tablet Take 1 tablet (50 mg total) by mouth at bedtime. 90 tablet 3   warfarin (COUMADIN) 5 MG tablet Take 1 to 1.5 tablets by mouth daily as directed by coumadin clinic 135 tablet 0   No current facility-administered medications for this visit.       Diagnostic Tests:  ECHOCARDIOGRAM REPORT       Patient Name:   Tyler Hendrix Date of Exam: 03/28/2018 Medical Rec #:  [id number]        Height:       71.0 in Accession #:    [id number]       Weight:       168.5 lb Date of Birth:  01/31/1956        BSA:          1.96 m Patient Age:    65 years         BP:           114/86 mmHg Patient Gender: M                HR:           88 bpm. Exam Location:  Inpatient    Procedure: 2D Echo  Indications:    CHF-Acute Systolic 829.56 / O13.08, Mitral Valve Disorder 424.0                 / I05.9, CAD Native Vessel 414.01 / I25.10   History:        Patient has prior history of Echocardiogram examinations, most                 recent 11/25/2017. CAD, Pacemaker, Paroxysmal atrial                 fibrillation; Risk Factors: Dyslipidemia and Hypertension. Long                 term (current) use of anticoagulants, History of Hodgkin's                 disease and Radiation Therapy.   Sonographer:    Alvino Chapel RCS Referring Phys: [place]    1. The left ventricle has severely reduced systolic function, with an ejection fraction of 25-30%. The cavity size was moderately dilated. There is moderately increased left ventricular wall thickness. Left ventricular diastolic Doppler parameters are  indeterminate.  2. Diffuse hypokinesis worse in the inferior [place] septum.  3. The right ventricle has normal systolic function. The cavity was normal. There is no increase in right ventricular wall thickness.  4. ? pacing wires in RA/RV.  5. Moderate pericardial effusion.  6. Mildly calcified tricuspid valve leaflets.  7. Mildly thickened tricuspid valve leaflets.  8. MVR.  9. Thickened and calcified tissue MVR Trivial central MR and mild MS by gradients Significant shadowing artifact in LA. 10. The tricuspid valve is degenerative. 11. The aortic valve is tricuspid Moderate thickening of the aortic valve Sclerosis  without any evidence of stenosis of the aortic valve. 12. The pulmonic valve was grossly normal. Pulmonic valve regurgitation is mild by color flow Doppler. 13. The aortic root is normal in size  [address]       [place], [address]             [facility] VIRTUAL OFFICE NOTE  Referring Provider Tawana Scale, MD Primary Cardiologist is Pixie Casino, MD PCP is Laurey Morale, MD   HPI:  I spoke with Tyler Hendrix (DOB November 01, 1955 ) via telephone on 06/14/2018 at 12:12 PM and verified that I was speaking with the correct person using more than one form of identification.  We discussed the reason(s) for conducting our visit virtually instead of in-person.  The patient expressed understanding the circumstances and agreed to proceed as described.   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  The patient's early postoperative recovery in the hospital was uneventful and he was discharged home on the fifth postoperative day.  He was last seen here in our office on February 22, 2018 and in February he was hospitalized for several days with acute exacerbation of chronic combined systolic and diastolic congestive heart failure with atrial fibrillation.  He was cardioverted back to sinus rhythm and diuresed 11 pounds during his hospitalization.  Echocardiogram performed at that time revealed decreased left ventricular systolic function with ejection fraction estimated 25 to 30%.  There was reportedly a "moderate" pericardial effusion.  The bioprosthetic tissue valve in the mitral position was functioning normally with "trivial" central mitral regurgitation.  The patient was last seen in follow-up by  Dr. Debara Pickett on April 12, 2018 at which time he was doing fairly well.  I telephoned the patient today and he reports that he is doing very well.  He has remained clinically stable since he was discharged from the hospital last February and overall he continues to improve.  He is jogging and exercising on a regular basis.  He is able to do 25 push-ups.  He has no pain in his chest.  Exercise tolerance continues to improve.  He no longer has any symptoms similar to that which he experienced when he was hospitalized last February.  Overall he is pleased with his progress.    Current Outpatient Medications  Medication Sig Dispense Refill   aspirin EC 81 MG tablet Take 81 mg by mouth daily.     atorvastatin (LIPITOR) 80 MG tablet TAKE 1 TABLET BY MOUTH  DAILY AT 6 PM. (Patient taking differently: Take 80 mg by mouth daily at 6 PM. ) 90 tablet 3   carvedilol (COREG) 6.25 MG tablet Take 1 tablet (6.25 mg total) by mouth 2 (two) times daily with a meal. 60 tablet 0   levocetirizine (XYZAL) 5 MG tablet Take 5 mg by mouth at bedtime.   3   levothyroxine (SYNTHROID, LEVOTHROID) 75 MCG tablet Take 1 tablet (75 mcg total) by mouth daily before breakfast. 45 tablet 3   montelukast (SINGULAIR) 10 MG tablet Take 10 mg by mouth at bedtime.   3   Multiple Vitamin (MULTIVITAMIN WITH MINERALS) TABS tablet Take 1 tablet by mouth daily.     Potassium Chloride ER 20 MEQ TBCR TAKE 1  TABLET BY MOUTH EVERY DAY WHILE TAKING FUROSEMIDE 30 tablet 2   sacubitril-valsartan (ENTRESTO) 24-26 MG Take 1 tablet by mouth 2 (two) times daily. 60 tablet 0   spironolactone (ALDACTONE) 25 MG tablet Take 0.5 tablets (12.5 mg total) by mouth daily. 90 tablet 1   traZODone (DESYREL) 50 MG tablet Take 1 tablet (50 mg total) by mouth at bedtime. 90 tablet 3   warfarin (COUMADIN) 5 MG tablet Take 1 to 1.5 tablets by mouth daily as directed by coumadin clinic 135 tablet 0   No current facility-administered medications for this visit.       Diagnostic Tests:  ECHOCARDIOGRAM REPORT       Patient Name:   Tyler Hendrix Date of Exam: 03/28/2018 Medical Rec #:  [id number]        Height:       71.0 in Accession #:    [id number]       Weight:       168.5 lb Date of Birth:  01/31/1956        BSA:          1.96 m Patient Age:    65 years         BP:           114/86 mmHg Patient Gender: M                HR:           88 bpm. Exam Location:  Inpatient    Procedure: 2D Echo  Indications:    CHF-Acute Systolic 829.56 / O13.08, Mitral Valve Disorder 424.0                 / I05.9, CAD Native Vessel 414.01 / I25.10   History:        Patient has prior history of Echocardiogram examinations, most                 recent 11/25/2017. CAD, Pacemaker, Paroxysmal atrial                 fibrillation; Risk Factors: Dyslipidemia and Hypertension. Long                 term (current) use of anticoagulants, History of Hodgkin's                 disease and Radiation Therapy.   Sonographer:    Alvino Chapel RCS Referring Phys: [place]    1. The left ventricle has severely reduced systolic function, with an ejection fraction of 25-30%. The cavity size was moderately dilated. There is moderately increased left ventricular wall thickness. Left ventricular diastolic Doppler parameters are  indeterminate.  2. Diffuse hypokinesis worse in the inferior [place] septum.  3. The right ventricle has normal systolic function. The cavity was normal. There is no increase in right ventricular wall thickness.  4. ? pacing wires in RA/RV.  5. Moderate pericardial effusion.  6. Mildly calcified tricuspid valve leaflets.  7. Mildly thickened tricuspid valve leaflets.  8. MVR.  9. Thickened and calcified tissue MVR Trivial central MR and mild MS by gradients Significant shadowing artifact in LA. 10. The tricuspid valve is degenerative. 11. The aortic valve is tricuspid Moderate thickening of the aortic valve Sclerosis  without any evidence of stenosis of the aortic valve. 12. The pulmonic valve was grossly normal. Pulmonic valve regurgitation is mild by color flow Doppler. 13. The aortic root is normal in size

## 2018-06-14 NOTE — Patient Instructions (Signed)
You may resume unrestricted physical activity without any particular limitations at this time.  Continue all previous medications without any changes at this time  Endocarditis is a potentially serious infection of heart valves or inside lining of the heart.  It occurs more commonly in patients with diseased heart valves (such as patient's with aortic or mitral valve disease) and in patients who have undergone heart valve repair or replacement.  Certain surgical and dental procedures may put you at risk, such as dental cleaning, other dental procedures, or any surgery involving the respiratory, urinary, gastrointestinal tract, gallbladder or prostate gland.   To minimize your chances for develooping endocarditis, maintain good oral health and seek prompt medical attention for any infections involving the mouth, teeth, gums, skin or urinary tract.    Always notify your doctor or dentist about your underlying heart valve condition before having any invasive procedures. You will need to take antibiotics before certain procedures, including all routine dental cleanings or other dental procedures.  Your cardiologist or dentist should prescribe these antibiotics for you to be taken ahead of time.

## 2018-06-28 ENCOUNTER — Telehealth: Payer: Self-pay

## 2018-06-28 NOTE — Telephone Encounter (Signed)

## 2018-06-29 ENCOUNTER — Ambulatory Visit (INDEPENDENT_AMBULATORY_CARE_PROVIDER_SITE_OTHER): Payer: 59 | Admitting: *Deleted

## 2018-06-29 ENCOUNTER — Other Ambulatory Visit: Payer: Self-pay

## 2018-06-29 DIAGNOSIS — Z953 Presence of xenogenic heart valve: Secondary | ICD-10-CM | POA: Diagnosis not present

## 2018-06-29 DIAGNOSIS — Z7901 Long term (current) use of anticoagulants: Secondary | ICD-10-CM | POA: Diagnosis not present

## 2018-06-29 LAB — POCT INR: INR: 1.7 — AB (ref 2.0–3.0)

## 2018-06-29 NOTE — Patient Instructions (Signed)
Description   Called spoke with pt, advised to take 12.5mg  today then continue taking 7.5mg  daily except 10mg  on Tuesdays and Saturdays.  Repeat INR in 3 weeks.

## 2018-07-06 ENCOUNTER — Ambulatory Visit: Payer: 59 | Admitting: *Deleted

## 2018-07-06 ENCOUNTER — Other Ambulatory Visit: Payer: Self-pay | Admitting: Cardiology

## 2018-07-07 ENCOUNTER — Telehealth: Payer: Self-pay

## 2018-07-07 NOTE — Telephone Encounter (Signed)
Left message for patient to remind of missed remote transmission.  

## 2018-07-09 LAB — CUP PACEART REMOTE DEVICE CHECK
Battery Remaining Longevity: 78 mo
Battery Voltage: 3.01 V
Brady Statistic AP VP Percent: 44.96 %
Brady Statistic AP VS Percent: 0.03 %
Brady Statistic AS VP Percent: 54.92 %
Brady Statistic AS VS Percent: 0.1 %
Brady Statistic RA Percent Paced: 44.93 %
Brady Statistic RV Percent Paced: 99.7 %
Date Time Interrogation Session: 20200528204353
Implantable Lead Implant Date: 20170922
Implantable Lead Implant Date: 20170922
Implantable Lead Location: 753859
Implantable Lead Location: 753860
Implantable Lead Model: 5076
Implantable Lead Model: 5076
Implantable Pulse Generator Implant Date: 20170922
Lead Channel Impedance Value: 323 Ohm
Lead Channel Impedance Value: 380 Ohm
Lead Channel Impedance Value: 399 Ohm
Lead Channel Impedance Value: 551 Ohm
Lead Channel Pacing Threshold Amplitude: 0.625 V
Lead Channel Pacing Threshold Amplitude: 0.875 V
Lead Channel Pacing Threshold Pulse Width: 0.4 ms
Lead Channel Pacing Threshold Pulse Width: 0.4 ms
Lead Channel Sensing Intrinsic Amplitude: 2.875 mV
Lead Channel Sensing Intrinsic Amplitude: 2.875 mV
Lead Channel Sensing Intrinsic Amplitude: 7.5 mV
Lead Channel Sensing Intrinsic Amplitude: 7.5 mV
Lead Channel Setting Pacing Amplitude: 2 V
Lead Channel Setting Pacing Amplitude: 2.5 V
Lead Channel Setting Pacing Pulse Width: 0.4 ms
Lead Channel Setting Sensing Sensitivity: 2 mV

## 2018-07-14 ENCOUNTER — Telehealth: Payer: Self-pay

## 2018-07-14 ENCOUNTER — Telehealth: Payer: Self-pay | Admitting: *Deleted

## 2018-07-14 NOTE — Telephone Encounter (Signed)

## 2018-07-14 NOTE — Telephone Encounter (Signed)
lmom for prescreen  

## 2018-07-15 ENCOUNTER — Telehealth (HOSPITAL_COMMUNITY): Payer: Self-pay | Admitting: Cardiology

## 2018-07-15 ENCOUNTER — Telehealth: Payer: Self-pay

## 2018-07-15 DIAGNOSIS — I5022 Chronic systolic (congestive) heart failure: Secondary | ICD-10-CM

## 2018-07-15 NOTE — Telephone Encounter (Signed)

## 2018-07-16 ENCOUNTER — Ambulatory Visit (HOSPITAL_COMMUNITY): Payer: 59 | Attending: Cardiology

## 2018-07-16 ENCOUNTER — Other Ambulatory Visit: Payer: Self-pay

## 2018-07-16 DIAGNOSIS — I5022 Chronic systolic (congestive) heart failure: Secondary | ICD-10-CM

## 2018-07-16 NOTE — Telephone Encounter (Signed)
Spoke with pt regarding appt on 07/19/18. Pt stated he did not have any questions at this moment. Pt was advise to check vitals prior to appt. Pt questions and concerns were address.

## 2018-07-19 ENCOUNTER — Telehealth (INDEPENDENT_AMBULATORY_CARE_PROVIDER_SITE_OTHER): Payer: 59 | Admitting: Internal Medicine

## 2018-07-19 ENCOUNTER — Telehealth: Payer: Self-pay | Admitting: Internal Medicine

## 2018-07-19 DIAGNOSIS — I442 Atrioventricular block, complete: Secondary | ICD-10-CM

## 2018-07-19 DIAGNOSIS — I10 Essential (primary) hypertension: Secondary | ICD-10-CM

## 2018-07-19 DIAGNOSIS — I519 Heart disease, unspecified: Secondary | ICD-10-CM | POA: Diagnosis not present

## 2018-07-19 NOTE — Telephone Encounter (Signed)
New Message   Patient states he got a my chart message about not receiving his remote pacer check, and he wants to make sure it's been received because he did it already.  Please check and let patient know.

## 2018-07-19 NOTE — Progress Notes (Signed)
Electrophysiology TeleHealth Note   Due to national recommendations of social distancing due to COVID 19, an audio telehealth visit is felt to be most appropriate for this patient at this time.  See MyChart message from today for the patient's consent to telehealth for [name].  He is unable to do virtual visit and requires telephone call at this time   Date:  07/19/2018   ID:  Tyler Hendrix, DOB [birth year], MRN [id number]  Location: patient's home  Provider location: [place]  Evaluation Performed: Follow-up visit  PCP:  [name], MD  Cardiologist:  Chrystie Nose, MD  Electrophysiologist:  Dr Johney Frame  Chief Complaint:  CHF  History of Present Illness:    Tyler Hendrix is a 63 y.o. male who presents via audio conferencing for a telehealth visit today.  Since last being seen in our clinic, the patient reports doing very well.  He feels "stronger".  He remains active. Today, he denies symptoms of palpitations, chest pain, shortness of breath,  lower extremity edema, dizziness, presyncope, or syncope.  The patient is otherwise without complaint today.  The patient denies symptoms of fevers, chills, cough, or new SOB worrisome for COVID 19.  Past Medical History:  Diagnosis Date  . Abnormal TSH   . Arthritis    ddd  . Atrial fibrillation and flutter (HCC)    s/p DCCV 03/26/2018  . CAD in native artery    a. 11/2015: s/p 2 vessel PCI of the first diagonal branch of the LAD in the mid left circumflex with DES. // s/p CABG 01/2018  . Chronic combined systolic and diastolic CHF (congestive heart failure) (HCC)    Echo 10/19 (prior to MVR, CABG):  Mild LVH, anteroseptal and apical hypokinesis, EF 40, grade 2 diastolic dysfunction, MAC, moderate to severe MR, moderate to severe LAE, normal RV SF, PASP 39, trivial effusion  . Complete heart block (HCC)    a. s/p Medtronic ppm 10/2015.  Marland Kitchen History of radiation therapy 2002  . Hodgkin disease (HCC) 2002   a. s/p  chest radiation and chemotherapy  . Hypothyroidism   . Incidental pulmonary nodule 01/19/2018   Left apical opacity - possible scarring  . Mitral regurgitation    s/p bioprosthetic MV replacement 01/2018  . Pacemaker 10/2015   Medtronic  . PVC's (premature ventricular contractions)   . Right bundle branch block   . 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 S/P mitral valve replacement with bioprosthetic valve 01/27/2018   29 mm [facility] Mitral stented bovine pericardial tissue valve    Past Surgical History:  Procedure Laterality Date  . CARDIAC CATHETERIZATION N/A 11/02/2015   Procedure: Left Heart Cath and Coronary Angiography;  Surgeon: Tonny Bollman, MD;  Location: [facility] INVASIVE CV LAB;  Service: Cardiovascular;  Laterality: N/A;  . CARDIAC CATHETERIZATION N/A 11/21/2015   Procedure: Coronary Stent Intervention;  Surgeon: Tonny Bollman, MD;  Location: [facility] INVASIVE CV LAB;  Service: Cardiovascular;  Laterality: N/A;  . CARDIAC CATHETERIZATION  11/2017  . CARDIOVERSION N/A 03/26/2018   Procedure: CARDIOVERSION;  Surgeon: Parke Poisson, MD;  Location: [facility] ENDOSCOPY;  Service: Cardiovascular;  Laterality: N/A;  . CORONARY ARTERY BYPASS GRAFT N/A 01/27/2018   Procedure: CORONARY ARTERY BYPASS GRAFTING (CABG) x two, using left internal mammary artery and right leg greater saphenous vein harvested endoscopically;  Surgeon: Purcell Nails, MD;  Location: [facility] OR;  Service: Open  Heart Surgery;  Laterality: N/A;  . EP IMPLANTABLE DEVICE N/A 11/02/2015   MDT Adivisa MRI conditional dual chamber pacemaker implanted by Dr Johney Frame for complete heart block  . KNEE SURGERY Right 1972  . LUMBAR LAMINECTOMY  1996  . MITRAL VALVE REPLACEMENT N/A 01/27/2018   Procedure: MITRAL VALVE (MV) REPLACEMENT using Magna Mitral Ease Valve Size ;  Surgeon: Purcell Nails, MD;  Location: [facility] OR;  Service: Open Heart Surgery;  Laterality: N/A;  . RIGHT/LEFT HEART CATH AND  CORONARY ANGIOGRAPHY N/A 12/02/2017   Procedure: RIGHT/LEFT HEART CATH AND CORONARY ANGIOGRAPHY;  Surgeon: Corky Crafts, MD;  Location: [facility] INVASIVE CV LAB;  Service: Cardiovascular;  Laterality: N/A;  . TEE WITHOUT CARDIOVERSION N/A 12/09/2017   Procedure: TRANSESOPHAGEAL ECHOCARDIOGRAM (TEE);  Surgeon: Jodelle Red, MD;  Location: [facility] ENDOSCOPY;  Service: Cardiovascular;  Laterality: N/A;  . TEE WITHOUT CARDIOVERSION N/A 01/27/2018   Procedure: TRANSESOPHAGEAL ECHOCARDIOGRAM (TEE);  Surgeon: Purcell Nails, MD;  Location: [facility] OR;  Service: Open Heart Surgery;  Laterality: N/A;    Current Outpatient Medications  Medication Sig Dispense Refill  . aspirin EC 81 MG tablet Take 81 mg by mouth daily.    Marland Kitchen atorvastatin (LIPITOR) 80 MG tablet TAKE 1 TABLET BY MOUTH  DAILY AT 6 PM. (Patient taking differently: Take 80 mg by mouth daily at 6 PM. ) 90 tablet 3  . carvedilol (COREG) 6.25 MG tablet Take 1 tablet (6.25 mg total) by mouth 2 (two) times daily with a meal. 60 tablet 0  . ENTRESTO 24-26 MG TAKE 1 TABLET BY MOUTH 2 TIMES DAILY 60 tablet 0  . levothyroxine (SYNTHROID, LEVOTHROID) 75 MCG tablet Take 1 tablet (75 mcg total) by mouth daily before breakfast. 45 tablet 3  . Multiple Vitamin (MULTIVITAMIN WITH MINERALS) TABS tablet Take 1 tablet by mouth daily.    . Potassium Chloride ER 20 MEQ TBCR TAKE 1 TABLET BY MOUTH EVERY DAY WHILE TAKING FUROSEMIDE 30 tablet 2  . spironolactone (ALDACTONE) 25 MG tablet Take 0.5 tablets (12.5 mg total) by mouth daily. 90 tablet 1  . traZODone (DESYREL) 50 MG tablet Take 1 tablet (50 mg total) by mouth at bedtime. 90 tablet 3  . warfarin (COUMADIN) 5 MG tablet Take 1 to 1.5 tablets by mouth daily as directed by coumadin clinic 135 tablet 0   No current facility-administered medications for this visit.     Allergies:   Other   Social History:  The patient  reports that he has never smoked. He has never used smokeless tobacco. He reports  current alcohol use of about 5.0 standard drinks of alcohol per week. He reports that he does not use drugs.   Family History:  The patient's  family history includes Cancer in an other family member; Heart disease in his father and mother.   ROS:  Please see the history of present illness.   All other systems are personally reviewed and negative.    Exam:    Vital Signs:  There were no vitals taken for this visit.  Well sounding   Labs/Other Tests and Data Reviewed:    Recent Labs: 01/25/2018: ALT 34 03/27/2018: B Natriuretic Peptide 1,710.6; Hemoglobin 13.9; Magnesium 2.2; Platelets 357; TSH 4.630 04/01/2018: BUN 16; Creatinine, Ser 1.15; Potassium 4.2; Sodium 139   Wt Readings from Last 3 Encounters:  04/16/18 173 lb (78.5 kg)  04/12/18 172 lb (78 kg)  04/05/18 157 lb (71.2 kg)     Other studies personally reviewed: Additional studies/ records  that were reviewed today include: recent echo, my prior notes.  Dr Blanchie Dessert notes Review of the above records today demonstrates: as above   Last device remote is reviewed from P[employer] PDF dated 07/08/2018 which reveals normal device function, no arrhythmias    ASSESSMENT & PLAN:    1.  Chronic systolic dysfunction EF remains stable at 30-35%.  We discussed options of upgrade to CRT-P or CRT-D at length today.  He is leaning towards CRT-P upgrade.  He would like to contemplate this further.  If he decides to proceed with upgrade then he will contact our office.  If he decides to wait, then he would like to reassess with follow-up echo in 6 months.  2. Complete heart block 99% V paced Possibly causing a cardiomyopathy from RV pacing. We are considering upgrade as above Remotes are uptodate Recent remote reveals normal device function  3. Persistent aifb Stable No change required today On coumadin  4. S/p MV replacement (bioprosthetic) with CABG Stable No change required today  5. HTN Stable No change required today  6.  COVID 19 screen The patient denies symptoms of COVID 19 at this time.  The importance of social distancing was discussed today.  Follow-up:  With me in 6 months with an echo prior to the visit unless he decides to proceed with CRT-P upgrade in the interim. Next remote: 09/2018  Current medicines are reviewed at length with the patient today.   The patient does not have concerns regarding his medicines.  The following changes were made today:  none  Labs/ tests ordered today include:  No orders of the defined types were placed in this encounter.  Patient Risk:  after full review of this patients clinical status, I feel that they are at moderate risk at this time.  Today, I have spent 15 minutes with the patient with telehealth technology discussing CHF .    Randolm Idol, MD  07/19/2018 4:14 PM     [facility] [address] [place] [address] [phone] (office) [phone] (fax)

## 2018-07-20 ENCOUNTER — Ambulatory Visit (INDEPENDENT_AMBULATORY_CARE_PROVIDER_SITE_OTHER): Payer: 59 | Admitting: *Deleted

## 2018-07-20 ENCOUNTER — Other Ambulatory Visit: Payer: Self-pay

## 2018-07-20 DIAGNOSIS — Z7901 Long term (current) use of anticoagulants: Secondary | ICD-10-CM | POA: Diagnosis not present

## 2018-07-20 DIAGNOSIS — Z953 Presence of xenogenic heart valve: Secondary | ICD-10-CM | POA: Diagnosis not present

## 2018-07-20 LAB — POCT INR: INR: 1.7 — AB (ref 2.0–3.0)

## 2018-07-20 NOTE — Patient Instructions (Signed)
Description    Take 10mg  tomorrow (pt already took today), then increase dose to 7.5mg  daily except 10mg  on Tuesdays, Thursday, and Saturdays.  Repeat INR in 3 weeks.

## 2018-07-23 NOTE — Telephone Encounter (Signed)
ECHO ordered.  F/u appt made.

## 2018-07-23 NOTE — Telephone Encounter (Signed)
-----   Message from [name], MD sent at 07/19/2018  8:46 PM EDT ----- Follow-up with me in 6 months Echo prior to the visit

## 2018-07-26 ENCOUNTER — Other Ambulatory Visit: Payer: Self-pay | Admitting: *Deleted

## 2018-07-26 ENCOUNTER — Ambulatory Visit: Payer: 59 | Admitting: Family Medicine

## 2018-07-26 ENCOUNTER — Encounter: Payer: Self-pay | Admitting: Family Medicine

## 2018-07-26 ENCOUNTER — Other Ambulatory Visit: Payer: Self-pay

## 2018-07-26 ENCOUNTER — Ambulatory Visit: Payer: Self-pay

## 2018-07-26 ENCOUNTER — Ambulatory Visit (INDEPENDENT_AMBULATORY_CARE_PROVIDER_SITE_OTHER)
Admission: RE | Admit: 2018-07-26 | Discharge: 2018-07-26 | Disposition: A | Discharge status: Home / Self Care | Payer: 59 | Source: Ambulatory Visit | Attending: Family Medicine | Admitting: Family Medicine

## 2018-07-26 VITALS — BP 124/88 | HR 54 | Ht 71.0 in | Wt 185.0 lb

## 2018-07-26 DIAGNOSIS — M25511 Pain in right shoulder: Secondary | ICD-10-CM | POA: Diagnosis not present

## 2018-07-26 DIAGNOSIS — S43014A Anterior dislocation of right humerus, initial encounter: Secondary | ICD-10-CM | POA: Diagnosis not present

## 2018-07-26 DIAGNOSIS — S42254A Nondisplaced fracture of greater tuberosity of right humerus, initial encounter for closed fracture: Secondary | ICD-10-CM | POA: Diagnosis not present

## 2018-07-26 DIAGNOSIS — S42253A Displaced fracture of greater tuberosity of unspecified humerus, initial encounter for closed fracture: Secondary | ICD-10-CM | POA: Insufficient documentation

## 2018-07-26 MED ORDER — TIZANIDINE HCL 4 MG PO TABS: 4.0000 mg | ORAL_TABLET | Freq: Three times a day (TID) | ORAL | 0 refills | Status: DC | PRN

## 2018-07-26 NOTE — Assessment & Plan Note (Addendum)
I do believe the patient did have a dislocation of the right shoulder.  X-rays are showing patient has fairly well relocation at the moment.  Patient's rotator cuff on ultrasound appears to be intact.  I do believe that patient will do fairly well.  Patient does have an effusion of the joint likely secondary to the Coumadin.  No true fracture appreciated but concern for a Bank[place] lesion.  Discussed with patient that this may take significant amount of time.  We discussed sling for comfort, patient will consider it.  Patient given topical anti-inflammatories and will avoid oral anti-inflammatories secondary to the blood thinner he is on for open heart surgery.  Patient also given a muscle relaxer that I think will be beneficial.  Warned of potential side effects.  Discussed heat and icing regimen.  Range of motion exercises given.  Follow-up again in 10 days

## 2018-07-26 NOTE — Progress Notes (Addendum)
[name] [address] [place], [place] [address] Phone: [phone] Subjective:   Fontaine No, am serving as a scribe for Dr. Hulan Saas.   CC: Shoulder pain after fall  HTD:[id number]  Tyler Hendrix is a 63 y.o. male coming in with complaint of right shoulder pain. Patient fell off of a ladder on Saturday night. Had low back pain and muscle spasms but those has improved. Denies any radiating symptoms. Landed on his shoulder. Did dislocate his shoulder but that spontaneously reduced when he rolled over. Pain with flexion. Unable to fully flex due to pain in middle deltoid. Also has edema and ecchymosis of left elbow.       Past Medical History:  Diagnosis Date  . Abnormal TSH   . Arthritis    ddd  . Atrial fibrillation and flutter ([place])    s/p DCCV 03/26/2018  . CAD in native artery    a. 11/2015: s/p 2 vessel PCI of the first diagonal branch of the LAD in the mid left circumflex with DES. // s/p CABG 01/2018  . Chronic combined systolic and diastolic CHF (congestive heart failure) ([place])    Echo 10/19 (prior to MVR, CABG):  Mild LVH, anteroseptal and apical hypokinesis, EF 40, grade 2 diastolic dysfunction, MAC, moderate to severe MR, moderate to severe LAE, normal RV SF, PASP 39, trivial effusion  . Complete heart block (HCC)    a. s/p Medtronic ppm 10/2015.  Marland Kitchen History of radiation therapy 2002  . Hodgkin disease ([place]) 2002   a. s/p chest radiation and chemotherapy  . Hypothyroidism   . Incidental pulmonary nodule 01/19/2018   Left apical opacity - possible scarring  . Mitral regurgitation    s/p bioprosthetic MV replacement 01/2018  . Pacemaker 10/2015   Medtronic  . PVC's (premature ventricular contractions)   . Right bundle branch block   . 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 S/P mitral valve replacement with bioprosthetic valve 01/27/2018   29 mm [facility] Mitral stented bovine  pericardial tissue valve   Past Surgical History:  Procedure Laterality Date  . CARDIAC CATHETERIZATION N/A 11/02/2015   Procedure: Left Heart Cath and Coronary Angiography;  Surgeon: Sherren Mocha, MD;  Location: [place] CV LAB;  Service: Cardiovascular;  Laterality: N/A;  . CARDIAC CATHETERIZATION N/A 11/21/2015   Procedure: Coronary Stent Intervention;  Surgeon: Sherren Mocha, MD;  Location: [place] CV LAB;  Service: Cardiovascular;  Laterality: N/A;  . CARDIAC CATHETERIZATION  11/2017  . CARDIOVERSION N/A 03/26/2018   Procedure: CARDIOVERSION;  Surgeon: [name], MD;  Location: [facility] ENDOSCOPY;  Service: Cardiovascular;  Laterality: N/A;  . CORONARY ARTERY BYPASS GRAFT N/A 01/27/2018   Procedure: CORONARY ARTERY BYPASS GRAFTING (CABG) x two, using left internal mammary artery and right leg greater saphenous vein harvested endoscopically;  Surgeon: Rexene Alberts, MD;  Location: [place];  Service: Open Heart Surgery;  Laterality: N/A;  . EP IMPLANTABLE DEVICE N/A 11/02/2015   MDT Adivisa MRI conditional dual chamber pacemaker implanted by Dr Rayann Heman for complete heart block  . KNEE SURGERY Right 1972  . LUMBAR LAMINECTOMY  1996  . MITRAL VALVE REPLACEMENT N/A 01/27/2018   Procedure: MITRAL VALVE (MV) REPLACEMENT using Magna Mitral Ease Valve Size 29MM;  Surgeon: Rexene Alberts, MD;  Location: [place];  Service: Open Heart Surgery;  Laterality: N/A;  . RIGHT/LEFT HEART CATH AND CORONARY ANGIOGRAPHY  N/A 12/02/2017   Procedure: RIGHT/LEFT HEART CATH AND CORONARY ANGIOGRAPHY;  Surgeon: Jettie Booze, MD;  Location: [place] CV LAB;  Service: Cardiovascular;  Laterality: N/A;  . TEE WITHOUT CARDIOVERSION N/A 12/09/2017   Procedure: TRANSESOPHAGEAL ECHOCARDIOGRAM (TEE);  Surgeon: Buford Dresser, MD;  Location: [facility] ENDOSCOPY;  Service: Cardiovascular;  Laterality: N/A;  . TEE WITHOUT CARDIOVERSION N/A 01/27/2018   Procedure: TRANSESOPHAGEAL ECHOCARDIOGRAM (TEE);   Surgeon: Rexene Alberts, MD;  Location: [place];  Service: Open Heart Surgery;  Laterality: N/A;   Social History   Socioeconomic History  . Marital status: Married    Spouse name: Not on file  . Number of children: 2  . Years of education: Masters  . Highest education level: Not on file  Occupational History  . Occupation: Chief Executive Officer    Comment: [employer]  Social Needs  . Financial resource strain: Not on file  . Food insecurity    Worry: Not on file    Inability: Not on file  . Transportation needs    Medical: Not on file    Non-medical: Not on file  Tobacco Use  . Smoking status: Never Smoker  . Smokeless tobacco: Never Used  Substance and Sexual Activity  . Alcohol use: Yes    Alcohol/week: 5.0 standard drinks    Types: 5 Glasses of wine per week  . Drug use: No  . Sexual activity: Not on file  Lifestyle  . Physical activity    Days per week: Not on file    Minutes per session: Not on file  . Stress: Not on file  Relationships  . Social Herbalist on phone: Not on file    Gets together: Not on file    Attends religious service: Not on file    Active member of club or organization: Not on file    Attends meetings of clubs or organizations: Not on file    Relationship status: Not on file  Other Topics Concern  . 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 Other     Pt reported allergic to dust mites that causes of sneezing, runny nose   Family History  Problem Relation Age of Onset  . Cancer Other        prostate father hx  . Heart disease Mother   . Heart disease Father   . Colon cancer Neg Hx     Current Outpatient Medications (Endocrine & Metabolic):  .  levothyroxine (SYNTHROID, LEVOTHROID) 75 MCG tablet, Take 1 tablet (75 mcg total) by mouth daily before breakfast.  Current Outpatient Medications (Cardiovascular):  .  atorvastatin (LIPITOR) 80 MG  tablet, TAKE 1 TABLET BY MOUTH  DAILY AT 6 PM. (Patient taking differently: Take 80 mg by mouth daily at 6 PM. ) .  carvedilol (COREG) 6.25 MG tablet, Take 1 tablet (6.25 mg total) by mouth 2 (two) times daily with a meal. .  ENTRESTO 24-26 MG, TAKE 1 TABLET BY MOUTH 2 TIMES DAILY .  spironolactone (ALDACTONE) 25 MG tablet, Take 0.5 tablets (12.5 mg total) by mouth daily.   Current Outpatient Medications (Analgesics):  .  aspirin EC 81 MG tablet, Take 81 mg by mouth daily.  Current Outpatient Medications (Hematological):  .  warfarin (COUMADIN) 5 MG tablet, Take 1 to 1.5 tablets by mouth daily as directed by coumadin clinic  Current Outpatient Medications (Other):  .  Multiple Vitamin (MULTIVITAMIN WITH MINERALS) TABS tablet, Take 1 tablet by mouth daily. .  Potassium Chloride ER 20 MEQ TBCR, TAKE 1 TABLET BY MOUTH EVERY DAY WHILE TAKING FUROSEMIDE .  traZODone (DESYREL) 50 MG tablet, Take 1 tablet (50 mg total) by mouth at bedtime. Marland Kitchen  tiZANidine (ZANAFLEX) 4 MG tablet, Take 1 tablet (4 mg total) by mouth 3 (three) times daily as needed for muscle spasms.    Past medical history, social, surgical and family history all reviewed in electronic medical record.  No pertanent information unless stated regarding to the chief complaint.   Review of Systems:  No headache, visual changes, nausea, vomiting, diarrhea, constipation, dizziness, abdominal pain, skin rash, fevers, chills, night sweats, weight loss, swollen lymph nodes, body aches, joint swelling,  chest pain, shortness of breath, mood changes.  Positive muscle aches  Objective  Blood pressure 124/88, pulse (!) 54, height _0  (1.803 m), weight 185 lb (83.9 kg), SpO2 98 %.    General: No apparent distress alert and oriented x3 mood and affect normal, dressed appropriately.  HEENT: Pupils equal, extraocular movements intact  Respiratory: Patient's speak in full sentences and does not appear short of breath  Cardiovascular: No lower  extremity edema, non tender, no erythema  Skin: Warm dry intact with no signs of infection or rash on extremities or on axial skeleton.  Abdomen: Soft nontender  Neuro: Cranial nerves II through XII are intact, neurovascularly intact in all extremities with 2+ DTRs and 2+ pulses.  Lymph: No lymphadenopathy of posterior or anterior cervical chain or axillae bilaterally.  Gait normal with good balance and coordination.  MSK:  Non tender with full range of motion and good stability and symmetric strength and tone of  elbows, wrist, hip, knee and ankles bilaterally.  Right shoulder exam shows the patient is moving and somewhat anteriorly.  Patient does have significant voluntary guarding noted.  No effusion noted.  Neurovascular intact distally.  Does have full range of motion of the elbow.  Patient has good grip strength.  Significant impingement with internal and external range of motion.  No pain over the clavicle bone itself.  Limited musculoskeletal ultrasound was performed and interpreted by Lyndal Pulley  Limited ultrasound of patient's shoulder shows that there is a large contusion.  No true cortical defect noted.  Patient's supraspinatus tendon is intact patient does have a small effusion noted.  Potential irritation to the posterior labrum noted.  Anterior labrum also may have a tear with hyperechoic changes surrounding the bicep tendon near the subscapularis.  Unable to externally rotate patient shoulder enough to be able to see the anterior shoulder. Impression: Shoulder contusion with contusion to the likely the rotator cuff and possible labral pathology.     Impression and Recommendations:     This case required medical decision making of moderate complexity. The above documentation has been reviewed and is accurate and complete Lyndal Pulley, DO       Note: This dictation was prepared with Dragon dictation along with smaller phrase technology. Any transcriptional errors that  result from this process are unintentional.

## 2018-07-26 NOTE — Patient Instructions (Addendum)
Zanaflex up to 3x a day as needed See me again in

## 2018-07-26 NOTE — Assessment & Plan Note (Signed)
No signs of any displacement.  Should do well with conservative therapy.  Patient does have the rotator cuff and some mild labral pathology that could be contributing as well.  Dislocation seems to be improved.

## 2018-07-28 ENCOUNTER — Telehealth: Payer: Self-pay | Admitting: *Deleted

## 2018-07-28 NOTE — Telephone Encounter (Signed)
Pt called stating that when he was here last week Dr. Tamala Julian mentioned about him getting a sling but he did not receive one. Pt also stated that he has been taking oxycodone that he had left over from open heart surgery to help with the pt. He is almost out & is requesting Dr. Tamala Julian to send something in to help with the pain.

## 2018-07-29 MED ORDER — TRAMADOL HCL 50 MG PO TABS: 50.0000 mg | ORAL_TABLET | Freq: Three times a day (TID) | ORAL | 0 refills | Status: AC | PRN

## 2018-07-29 NOTE — Telephone Encounter (Signed)
Dr. Tamala Julian has already sent the Tramadol into the pt's pharmacy.   Can one of you call him about coming in to get a sling?

## 2018-07-29 NOTE — Telephone Encounter (Signed)
Spoke with patient who will get the sling over the counter.

## 2018-07-29 NOTE — Telephone Encounter (Signed)
I apologize.  He will need to come back for a sling and we can give him one.   I will send in tramadol and can take up to 3 times a day but only 7 days worth and should do well thereafter with just tylenol

## 2018-08-03 ENCOUNTER — Telehealth: Payer: Self-pay

## 2018-08-03 NOTE — Telephone Encounter (Signed)

## 2018-08-03 NOTE — Progress Notes (Signed)
[facility] [address] [place], [place] [address] Phone: [phone] Subjective:   I Tyler Hendrix am serving as a Education administrator for Dr. Hulan Saas.   CC: Right shoulder pain follow-up  [id number]  Tyler Hendrix is a 63 y.o. male coming in with complaint of right shoulder pain.  Patient did have an anterior dislocation and was found to have a closed fracture of the greater tuberosity of the humerus that was nondisplaced.  Patient given a sling, home exercises, icing regimen.  Patient states that he has made some progress but he is still in pain. Oxycodone works but the tramadol does not help with pain. Takes the tramadol with tylenol. Passive ROM is getting better. Still exercising. Tries not to use his right arm as much.     Past Medical History:  Diagnosis Date   Abnormal TSH    Arthritis    ddd   Atrial fibrillation and flutter ([place])    s/p DCCV 03/26/2018   CAD in native artery    a. 11/2015: s/p 2 vessel PCI of the first diagonal branch of the LAD in the mid left circumflex with DES. // s/p CABG 01/2018   Chronic combined systolic and diastolic CHF (congestive heart failure) ([place])    Echo 10/19 (prior to MVR, CABG):  Mild LVH, anteroseptal and apical hypokinesis, EF 40, grade 2 diastolic dysfunction, MAC, moderate to severe MR, moderate to severe LAE, normal RV SF, PASP 39, trivial effusion   Complete heart block (HCC)    a. s/p Medtronic ppm 10/2015.   History of radiation therapy 2002   Hodgkin disease ([place]) 2002   a. s/p chest radiation and chemotherapy   Hypothyroidism    Incidental pulmonary nodule 01/19/2018   Left apical opacity - possible scarring   Mitral regurgitation    s/p bioprosthetic MV replacement 01/2018   Pacemaker 10/2015   Medtronic   PVC's (premature ventricular contractions)    Right bundle branch block    S/P CABG x 2 01/27/2018   LIMA to LAD, SVG to RCA, EVH via right thigh   S/P mitral valve  replacement with bioprosthetic valve 01/27/2018   29 mm [facility] Mitral stented bovine pericardial tissue valve   Past Surgical History:  Procedure Laterality Date   CARDIAC CATHETERIZATION N/A 11/02/2015   Procedure: Left Heart Cath and Coronary Angiography;  Surgeon: Sherren Mocha, MD;  Location: [place] CV LAB;  Service: Cardiovascular;  Laterality: N/A;   CARDIAC CATHETERIZATION N/A 11/21/2015   Procedure: Coronary Stent Intervention;  Surgeon: Sherren Mocha, MD;  Location: [place] CV [facility];  Service: Cardiovascular;  Laterality: N/A;   CARDIAC CATHETERIZATION  11/2017   CARDIOVERSION N/A 03/26/2018   Procedure: CARDIOVERSION;  Surgeon: [name], MD;  Location[facility] ENDOSCOPY;  Service: Cardiovascular;  Laterality: N/A;   CORONARY ARTERY BYPASS GRAFT N/A 01/27/2018   Procedure: CORONARY ARTERY BYPASS GRAFTING (CABG) x two, using left internal mammary artery and right leg greater saphenous vein harvested endoscopically;  Surgeon: Rexene Alberts, MD;  Location: [place];  Service: Open Heart Surgery;  Laterality: N/A;   EP IMPLANTABLE DEVICE N/A 11/02/2015   MDT Adivisa MRI conditional dual chamber pacemaker implanted by Dr Rayann Heman for complete heart block   KNEE SURGERY Right [place]   MITRAL VALVE REPLACEMENT N/A 01/27/2018   Procedure: MITRAL VALVE (MV) REPLACEMENT using Magna Mitral Ease Valve Size 29MM;  Surgeon: Rexene Alberts, MD;  Location: [facility];  Service: Open Heart Surgery;  Laterality: N/A;   RIGHT/LEFT HEART CATH AND CORONARY ANGIOGRAPHY N/A 12/02/2017   Procedure: RIGHT/LEFT HEART CATH AND CORONARY ANGIOGRAPHY;  Surgeon: Jettie Booze, MD;  Location: [place] CV LAB;  Service: Cardiovascular;  Laterality: N/A;   TEE WITHOUT CARDIOVERSION N/A 12/09/2017   Procedure: TRANSESOPHAGEAL ECHOCARDIOGRAM (TEE);  Surgeon: Buford Dresser, MD;  Location: [facility] ENDOSCOPY;  Service: Cardiovascular;  Laterality: N/A;   TEE  WITHOUT CARDIOVERSION N/A 01/27/2018   Procedure: TRANSESOPHAGEAL ECHOCARDIOGRAM (TEE);  Surgeon: Rexene Alberts, MD;  Location: [place];  Service: Open Heart Surgery;  Laterality: N/A;   Social History   Socioeconomic History   Marital status: Married    Spouse name: Not on file   Number of children: 2   Years of education: Masters   Highest education level: Not on file  Occupational History   Occupation: lawyer    Comment: [employer], [place] resource strain: Not on file   Food insecurity    Worry: Not on file    Inability: Not on file   Transportation needs    Medical: Not on file    Non-medical: Not on file  Tobacco Use   Smoking status: Never Smoker   Smokeless tobacco: Never Used  Substance and Sexual Activity   Alcohol use: Yes    Alcohol/week: 5.0 standard drinks    Types: 5 Glasses of wine per week   Drug use: No   Sexual activity: Not on file  Lifestyle   Physical activity    Days per week: Not on file    Minutes per session: Not on file   Stress: Not on file  Relationships   Social connections    Talks on phone: Not on file    Gets together: Not on file    Attends religious service: Not on file    Active member of club or organization: Not on file    Attends meetings of clubs or organizations: Not on file    Relationship status: Not on file  Other Topics Concern   Not on file  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  Current Outpatient Medications (Cardiovascular):    atorvastatin (LIPITOR) 80 MG tablet, TAKE 1 TABLET BY MOUTH  DAILY AT 6 PM. (Patient taking differently: Take 80 mg by mouth daily at 6 PM. )   carvedilol (COREG) 6.25 MG tablet, Take 1 tablet (6.25 mg total) by mouth 2 (two) times daily with a meal.   ENTRESTO 24-26 MG, TAKE 1 TABLET BY MOUTH 2 TIMES DAILY   spironolactone (ALDACTONE) 25 MG tablet, Take 0.5 tablets (12.5 mg total) by mouth daily.   Current Outpatient Medications (Analgesics):    aspirin EC 81 MG tablet, Take 81 mg by mouth daily.   traMADol (ULTRAM) 50 MG tablet, Take 1 tablet (50 mg total) by mouth  every 8 (eight) hours as needed for up to 7 days.   oxyCODONE-acetaminophen (PERCOCET/ROXICET) 5-325 MG tablet, Take 1 tablet by mouth every 8 (eight) hours as needed for up to 5 days.  Current Outpatient Medications (Hematological):    warfarin (COUMADIN) 5 MG tablet, Take 1 to 1.5 tablets by mouth daily as directed by coumadin clinic  Current Outpatient Medications (Other):    Multiple Vitamin (MULTIVITAMIN WITH MINERALS) TABS tablet, Take 1 tablet by mouth daily.   Potassium Chloride ER 20 MEQ TBCR, TAKE 1 TABLET BY MOUTH EVERY DAY WHILE TAKING FUROSEMIDE   tiZANidine (ZANAFLEX) 4 MG tablet, Take 1 tablet (4 mg total) by mouth 3 (three) times daily as needed for muscle spasms.   traZODone (DESYREL) 50 MG tablet, Take 1 tablet (50 mg total) by mouth at bedtime.    Past medical history, social, surgical and family history all reviewed in electronic medical record.  No pertanent information unless stated regarding to the chief complaint.   Review of Systems:  No headache, visual changes, nausea, vomiting, diarrhea, constipation, dizziness, abdominal pain, skin rash, fevers, chills, night sweats, weight loss, swollen lymph nodes, body aches, joint swelling, chest pain, shortness of breath, mood changes.  + muscle aches   Objective  Blood pressure  128/70, pulse (!) 55, height _0  (1.803 m), weight 183 lb (83 kg), SpO2 98 %.    General: No apparent distress alert and oriented x3 mood and affect normal, dressed appropriately.  HEENT: Pupils equal, extraocular movements intact  Respiratory: Patient's speak in full sentences and does not appear short of breath  Cardiovascular: No lower extremity edema, non tender, no erythema  Skin: Warm dry intact with no signs of infection or rash on extremities or on axial skeleton.  Abdomen: Soft nontender  Neuro: Cranial nerves II through XII are intact, neurovascularly intact in all extremities with 2+ DTRs and 2+ pulses.  Lymph: No lymphadenopathy of posterior or anterior cervical chain or axillae bilaterally.  Gait normal with good balance and coordination.  MSK:  Non tender with full range of motion and good stability and symmetric strength and tone of  elbows, wrist, hip, knee and ankles bilaterally.  Shoulder: right  Inspection reveals no abnormalities, atrophy or asymmetry. Palpation TTP over humeral head  Significant improvement in range of motion.  Still tender over 85 degrees of forward flexion.  Patient does have active range of motion just has pain up to 160 degrees of flexion. Rotator cuff strength normal throughout. Mild positive impingement  Speeds and Yergason's tests normal. No labral pathology noted with negative Obrien's, negative clunk and good stability. Normal scapular function observed. No painful arc and no drop arm sign. No apprehension sign     Impression and Recommendations:    . The above documentation has been reviewed and is accurate and complete Lyndal Pulley, DO       Note: This dictation was prepared with Dragon dictation along with smaller phrase technology. Any transcriptional errors that result from this process are unintentional.

## 2018-08-04 ENCOUNTER — Ambulatory Visit: Payer: 59 | Admitting: Family Medicine

## 2018-08-04 ENCOUNTER — Encounter: Payer: Self-pay | Admitting: Family Medicine

## 2018-08-04 VITALS — BP 128/70 | HR 55 | Ht 71.0 in | Wt 183.0 lb

## 2018-08-04 DIAGNOSIS — G8929 Other chronic pain: Secondary | ICD-10-CM | POA: Diagnosis not present

## 2018-08-04 DIAGNOSIS — S42254D Nondisplaced fracture of greater tuberosity of right humerus, subsequent encounter for fracture with routine healing: Secondary | ICD-10-CM

## 2018-08-04 DIAGNOSIS — M25511 Pain in right shoulder: Secondary | ICD-10-CM

## 2018-08-04 MED ORDER — OXYCODONE-ACETAMINOPHEN 5-325 MG PO TABS: 1.0000 | ORAL_TABLET | Freq: Three times a day (TID) | ORAL | 0 refills | Status: AC | PRN

## 2018-08-04 NOTE — Assessment & Plan Note (Signed)
Patient overall is doing significantly better.  I am impressed with the amount of improvement he is already made.  Still having pain at night.  Oxycodone given for short course.  And patient will transition to the tramadol. Repeat xrays in 2-3 weeks

## 2018-08-04 NOTE — Patient Instructions (Addendum)
Good to see you.  Exercise 3 times a week Oxy at night Continue vitamin d Start theraband exercises in 7-10 days. Listen to your body See me again in 3 weeks

## 2018-08-05 ENCOUNTER — Telehealth: Payer: Self-pay | Admitting: Family Medicine

## 2018-08-05 NOTE — Telephone Encounter (Signed)
Pt states his wife and children traveled to [place] where they stayed with niece who has tested positive  For covid 19, resulted today. States wife and children were tested today, questioning if he should be tested as well. Pt is asymptomatic.  Please advise:  [phone]

## 2018-08-06 NOTE — Telephone Encounter (Signed)
Dr. Fry please advise. Thanks  

## 2018-08-06 NOTE — Telephone Encounter (Signed)
Yes he should be tested as well

## 2018-08-06 NOTE — Telephone Encounter (Signed)
Message sent to the [facility] to have the pt tested for covid 19

## 2018-08-07 ENCOUNTER — Telehealth: Payer: Self-pay

## 2018-08-07 DIAGNOSIS — Z20822 Contact with and (suspected) exposure to covid-19: Secondary | ICD-10-CM

## 2018-08-07 NOTE — Telephone Encounter (Signed)
-----   Message from Tyler Hendrix, Inez sent at 08/06/2018  4:18 PM EDT ----- Good afternoon!!  Dr. Sarajane Jews would like to have this patient tested for Covid 19.  His family traveled to [place] and stayed with family and a family member tested positive for covid 51.  Thank you

## 2018-08-07 NOTE — Telephone Encounter (Signed)
Called pt and scheduled appt for testing on 08/09/18 at 10:45 at [facility]. Pt instructed to stay in car and to wear mask to testing site and address of testing site. Pt verbalized understanding.

## 2018-08-08 ENCOUNTER — Other Ambulatory Visit: Payer: Self-pay | Admitting: Internal Medicine

## 2018-08-08 ENCOUNTER — Other Ambulatory Visit: Payer: Self-pay | Admitting: Cardiology

## 2018-08-09 ENCOUNTER — Other Ambulatory Visit: Payer: 59

## 2018-08-09 DIAGNOSIS — Z20822 Contact with and (suspected) exposure to covid-19: Secondary | ICD-10-CM

## 2018-08-12 LAB — NOVEL CORONAVIRUS, NAA: SARS-CoV-2, NAA: NOT DETECTED

## 2018-08-25 ENCOUNTER — Other Ambulatory Visit: Payer: Self-pay | Admitting: Physical Therapy

## 2018-08-25 ENCOUNTER — Ambulatory Visit (INDEPENDENT_AMBULATORY_CARE_PROVIDER_SITE_OTHER)
Admission: RE | Admit: 2018-08-25 | Discharge: 2018-08-25 | Disposition: A | Discharge status: Home / Self Care | Payer: 59 | Source: Ambulatory Visit | Attending: Family Medicine | Admitting: Family Medicine

## 2018-08-25 ENCOUNTER — Telehealth: Payer: Self-pay

## 2018-08-25 ENCOUNTER — Ambulatory Visit (INDEPENDENT_AMBULATORY_CARE_PROVIDER_SITE_OTHER): Payer: 59 | Admitting: Family Medicine

## 2018-08-25 ENCOUNTER — Other Ambulatory Visit: Payer: Self-pay

## 2018-08-25 ENCOUNTER — Encounter: Payer: Self-pay | Admitting: Family Medicine

## 2018-08-25 DIAGNOSIS — S42254D Nondisplaced fracture of greater tuberosity of right humerus, subsequent encounter for fracture with routine healing: Secondary | ICD-10-CM | POA: Diagnosis not present

## 2018-08-25 DIAGNOSIS — G8929 Other chronic pain: Secondary | ICD-10-CM

## 2018-08-25 DIAGNOSIS — M25511 Pain in right shoulder: Secondary | ICD-10-CM

## 2018-08-25 NOTE — Assessment & Plan Note (Signed)
Patient is making progress.  Repeat x-rays were independently visualized by me.  X-rays do not show any significant worsening of it at the moment.  Continue with the range of motion.  Patient declined formal physical therapy as well as a CT scan to further evaluate the integrity of the bone.  Patient will continue to be active otherwise.  Follow-up again in 4 to 6 weeks

## 2018-08-25 NOTE — Progress Notes (Signed)
[name] [address] [place], [place] [address] Phone: [phone] Subjective:   Tyler Hendrix, am serving as a scribe for Dr. Hulan Saas.   CC: Right shoulder pain follow-up  [id number]  Tyler Hendrix is a 63 y.o. male coming in with complaint of right shoulder pain.  Found to have a very minimally displaced greater tuberosity fracture of the right shoulder.  Patient's ultrasound previously only showed what appeared to be more of a contusion of the rotator cuff with possible labral pathology.  Patient was doing conservative therapy.  Patient states the he used his arm a lot recently and has been having pain in the middle deltoid. Has been taking less hydrocodone or Tylenol. Does feel like he has been improving but is still not 100%. Is doing the band exercises.       Past Medical History:  Diagnosis Date  . Abnormal TSH   . Arthritis    ddd  . Atrial fibrillation and flutter ([place])    s/p DCCV 03/26/2018  . CAD in native artery    a. 11/2015: s/p 2 vessel PCI of the first diagonal branch of the LAD in the mid left circumflex with DES. // s/p CABG 01/2018  . Chronic combined systolic and diastolic CHF (congestive heart failure) ([place])    Echo 10/19 (prior to MVR, CABG):  Mild LVH, anteroseptal and apical hypokinesis, EF 40, grade 2 diastolic dysfunction, MAC, moderate to severe MR, moderate to severe LAE, normal RV SF, PASP 39, trivial effusion  . Complete heart block (HCC)    a. s/p Medtronic ppm 10/2015.  Marland Kitchen History of radiation therapy 2002  . Hodgkin disease ([place]) 2002   a. s/p chest radiation and chemotherapy  . Hypothyroidism   . Incidental pulmonary nodule 01/19/2018   Left apical opacity - possible scarring  . Mitral regurgitation    s/p bioprosthetic MV replacement 01/2018  . Pacemaker 10/2015   Medtronic  . PVC's (premature ventricular contractions)   . Right bundle branch block   . S/P CABG x 2 01/27/2018   LIMA to LAD, SVG  to RCA, EVH via right thigh  . S/P mitral valve replacement with bioprosthetic valve 01/27/2018   29 mm [facility] Mitral stented bovine pericardial tissue valve   Past Surgical History:  Procedure Laterality Date  . CARDIAC CATHETERIZATION N/A 11/02/2015   Procedure: Left Heart Cath and Coronary Angiography;  Surgeon: Sherren Mocha, MD;  Location: [place] CV LAB;  Service: Cardiovascular;  Laterality: N/A;  . CARDIAC CATHETERIZATION N/A 11/21/2015   Procedure: Coronary Stent Intervention;  Surgeon: Sherren Mocha, MD;  Location: [place] [facility];  Service: Cardiovascular;  Laterality: N/A;  . CARDIAC CATHETERIZATION  11/2017  . CARDIOVERSION N/A 03/26/2018   Procedure: CARDIOVERSION;  Surgeon: [name], MD;  Location: [facility] ENDOSCOPY;  Service: Cardiovascular;  Laterality: N/A;  . CORONARY ARTERY BYPASS GRAFT N/A 01/27/2018   Procedure: CORONARY ARTERY BYPASS GRAFTING (CABG) x two, using left internal mammary artery and right leg greater saphenous vein harvested endoscopically;  Surgeon: Rexene Alberts, MD;  Location: [place];  Service: Open Heart Surgery;  Laterality: N/A;  . EP IMPLANTABLE DEVICE N/A 11/02/2015   MDT Adivisa MRI conditional dual chamber pacemaker implanted by Dr Rayann Heman for complete heart block  . KNEE SURGERY Right 1972  . LUMBAR LAMINECTOMY  1996  . MITRAL VALVE REPLACEMENT N/A 01/27/2018   Procedure: MITRAL VALVE (MV) REPLACEMENT using Magna Mitral Ease Valve Size 29MM;  Surgeon: Rexene Alberts, MD;  Location: [place];  Service: Open Heart Surgery;  Laterality: N/A;  . RIGHT/LEFT HEART CATH AND CORONARY ANGIOGRAPHY N/A 12/02/2017   Procedure: RIGHT/LEFT HEART CATH AND CORONARY ANGIOGRAPHY;  Surgeon: Jettie Booze, MD;  Location: [place] CV LAB;  Service: Cardiovascular;  Laterality: N/A;  . TEE WITHOUT CARDIOVERSION N/A 12/09/2017   Procedure: TRANSESOPHAGEAL ECHOCARDIOGRAM (TEE);  Surgeon: Buford Dresser, MD;  Location: [facility] ENDOSCOPY;   Service: Cardiovascular;  Laterality: N/A;  . TEE WITHOUT CARDIOVERSION N/A 01/27/2018   Procedure: TRANSESOPHAGEAL ECHOCARDIOGRAM (TEE);  Surgeon: Rexene Alberts, MD;  Location: [place];  Service: Open Heart Surgery;  Laterality: N/A;   Social History   Socioeconomic History  . Marital status: Married    Spouse name: Not on file  . Number of children: 2  . Years of education: Masters  . Highest education level: Not on file  Occupational History  . Occupation: Chief Executive Officer    Comment: [employer], [employer]  Social Needs  . Financial resource strain: Not on file  . Food insecurity    Worry: Not on file    Inability: Not on file  . Transportation needs    Medical: Not on file    Non-medical: Not on file  Tobacco Use  . Smoking status: Never Smoker  . Smokeless tobacco: Never Used  Substance and Sexual Activity  . Alcohol use: Yes    Alcohol/week: 5.0 standard drinks    Types: 5 Glasses of wine per week  . Drug use: Hendrix  . Sexual activity: Not on file  Lifestyle  . Physical activity    Days per week: Not on file    Minutes per session: Not on file  . Stress: Not on file  Relationships  . Social Herbalist on phone: Not on file    Gets together: Not on file    Attends religious service: Not on file    Active member of club or organization: Not on file    Attends meetings of clubs or organizations: Not on file    Relationship status: Not on file  Other Topics Concern  . 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 Other     Pt reported allergic to dust mites that causes of sneezing, runny nose   Family History  Problem Relation Age of Onset  . Cancer Other        prostate father hx  . Heart disease Mother   . Heart disease Father   . Colon cancer Neg Hx     Current Outpatient Medications (Endocrine & Metabolic):  .  levothyroxine (SYNTHROID, LEVOTHROID) 75 MCG tablet, Take 1  tablet (75 mcg total) by mouth daily before breakfast.  Current Outpatient Medications (Cardiovascular):  .  atorvastatin (LIPITOR) 80 MG tablet, TAKE 1 TABLET BY MOUTH  DAILY AT 6 PM. (Patient taking differently: Take 80 mg by mouth daily at 6 PM. ) .  carvedilol (COREG) 6.25 MG tablet, Take 1 tablet (6.25 mg total) by mouth 2 (two) times daily with a meal. .  ENTRESTO 24-26 MG, TAKE 1 TABLET BY MOUTH TWICE DAILY .  spironolactone (ALDACTONE) 25 MG tablet, Take 0.5 tablets (12.5 mg total) by mouth daily.   Current Outpatient Medications (Analgesics):  .  aspirin EC 81 MG tablet, Take 81 mg by mouth daily.  Current Outpatient Medications (Hematological):  Marland Kitchen  warfarin (COUMADIN) 5 MG tablet, TAKE 1 TO 1 AND 1/2 TABLETS BY MOUTH DAILY AS DIRECTED BY COUMADIN CLINIC  Current Outpatient Medications (Other):  Marland Kitchen  Multiple Vitamin (MULTIVITAMIN WITH MINERALS) TABS tablet, Take 1 tablet by mouth daily. .  Potassium Chloride ER 20 MEQ TBCR, TAKE 1 TABLET BY MOUTH EVERY DAY WHILE TAKING FUROSEMIDE .  tiZANidine (ZANAFLEX) 4 MG tablet, Take 1 tablet (4 mg total) by mouth 3 (three) times daily as needed for muscle spasms. .  traZODone (DESYREL) 50 MG tablet, Take 1 tablet (50 mg total) by mouth at bedtime.    Past medical history, social, surgical and family history all reviewed in electronic medical record.  Hendrix pertanent information unless stated regarding to the chief complaint.   Review of Systems:  Hendrix headache, visual changes, nausea, vomiting, diarrhea, constipation, dizziness, abdominal pain, skin rash, fevers, chills, night sweats, weight loss, swollen lymph nodes, body aches, joint swelling, muscle aches, chest pain, shortness of breath, mood changes.   Objective  There were Hendrix vitals taken for this visit. Systems examined below as of    General: Hendrix apparent distress alert and oriented x3 mood and affect normal, dressed appropriately.  HEENT: Pupils equal, extraocular movements intact   Respiratory: Patient's speak in full sentences and does not appear short of breath  Cardiovascular: Hendrix lower extremity edema, non tender, Hendrix erythema  Skin: Warm dry intact with Hendrix signs of infection or rash on extremities or on axial skeleton.  Abdomen: Soft nontender  Neuro: Cranial nerves II through XII are intact, neurovascularly intact in all extremities with 2+ DTRs and 2+ pulses.  Lymph: Hendrix lymphadenopathy of posterior or anterior cervical chain or axillae bilaterally.  Gait normal with good balance and coordination.  MSK:   Right shoulder exam shows the patient does not have any swelling.  The bruising seems to have completely resolved at this time.  Patient came having nearly full passive range of motion as well as mostly active at this time.  Neurovascular intact distally.  Rotator cuff strength 4+ out of 5.  Still some mild discomfort over the greater tuberosity area.   Impression and Recommendations:     This case required medical decision making of moderate complexity. The above documentation has been reviewed and is accurate and complete Lyndal Pulley, DO       Note: This dictation was prepared with Dragon dictation along with smaller phrase technology. Any transcriptional errors that result from this process are unintentional.

## 2018-08-25 NOTE — Patient Instructions (Signed)
Overall doing well Keep going Have fun at the beach  Continue the exercises See me again in 5 weeks

## 2018-08-25 NOTE — Telephone Encounter (Signed)
lmom for overdue inr 

## 2018-08-29 ENCOUNTER — Other Ambulatory Visit: Payer: Self-pay | Admitting: Cardiology

## 2018-09-08 ENCOUNTER — Other Ambulatory Visit: Payer: Self-pay | Admitting: Internal Medicine

## 2018-09-13 ENCOUNTER — Ambulatory Visit (INDEPENDENT_AMBULATORY_CARE_PROVIDER_SITE_OTHER): Payer: 59

## 2018-09-13 ENCOUNTER — Other Ambulatory Visit: Payer: Self-pay

## 2018-09-13 DIAGNOSIS — Z953 Presence of xenogenic heart valve: Secondary | ICD-10-CM

## 2018-09-13 DIAGNOSIS — Z7901 Long term (current) use of anticoagulants: Secondary | ICD-10-CM | POA: Diagnosis not present

## 2018-09-13 LAB — POCT INR: INR: 3.1 — AB (ref 2.0–3.0)

## 2018-09-13 NOTE — Patient Instructions (Signed)
Description   Start taking 7.5mg  daily except 10mg  on Tuesdays and Saturdays.  Repeat INR in 3 weeks.

## 2018-09-29 ENCOUNTER — Encounter: Payer: Self-pay | Admitting: Family Medicine

## 2018-09-29 ENCOUNTER — Ambulatory Visit: Payer: 59 | Admitting: Family Medicine

## 2018-09-29 ENCOUNTER — Ambulatory Visit: Payer: Self-pay

## 2018-09-29 ENCOUNTER — Other Ambulatory Visit: Payer: Self-pay

## 2018-09-29 VITALS — BP 112/62 | HR 64 | Ht 71.0 in | Wt 187.0 lb

## 2018-09-29 DIAGNOSIS — M25511 Pain in right shoulder: Secondary | ICD-10-CM

## 2018-09-29 DIAGNOSIS — G8929 Other chronic pain: Secondary | ICD-10-CM

## 2018-09-29 DIAGNOSIS — M19011 Primary osteoarthritis, right shoulder: Secondary | ICD-10-CM

## 2018-09-29 DIAGNOSIS — M75111 Incomplete rotator cuff tear or rupture of right shoulder, not specified as traumatic: Secondary | ICD-10-CM | POA: Insufficient documentation

## 2018-09-29 DIAGNOSIS — M19019 Primary osteoarthritis, unspecified shoulder: Secondary | ICD-10-CM | POA: Insufficient documentation

## 2018-09-29 DIAGNOSIS — S46011A Strain of muscle(s) and tendon(s) of the rotator cuff of right shoulder, initial encounter: Secondary | ICD-10-CM | POA: Diagnosis not present

## 2018-09-29 DIAGNOSIS — S42254D Nondisplaced fracture of greater tuberosity of right humerus, subsequent encounter for fracture with routine healing: Secondary | ICD-10-CM | POA: Diagnosis not present

## 2018-09-29 NOTE — Assessment & Plan Note (Signed)
Patient given the diagnostic injection patient did have some mild increase in range of motion but still had some difficulty.  I would like to get a MRI arthrogram to further evaluate the integrity of the bone as well as the rotator cuff in the labrum.  Patient would be a candidate for surgical intervention if this is necessary.  Patient's last x-rays did show that the greater tuberosity fracture seem to be healing.Tyler Hendrix

## 2018-09-29 NOTE — Progress Notes (Signed)
[name] [address], [place] [address] Phone: [phone] Subjective:   Tyler Hendrix, am serving as a scribe for Dr. Hulan Saas.  I'm seeing this patient by the request  of:    CC: shoulder pain follow up   QAS:[id number]   08/25/2018 Patient is making progress.  Repeat x-rays were independently visualized by me.  X-rays do not show any significant worsening of it at the moment.  Continue with the range of motion.  Patient declined formal physical therapy as well as a CT scan to further evaluate the integrity of the bone.  Patient will continue to be active otherwise.  Follow-up again in 4 to 6 weeks  Update 09/29/2018 Tyler Hendrix is a 63 y.o. male coming in with complaint of right shoulder pain. Patient states that he is improving but slowly. Has not had to take pain medication since end of July. Pain with sudden movements. Pain with pull down movements but is able to perform other movements at the gym. Feels a weakness in shoulder.     Past Medical History:  Diagnosis Date  . Abnormal TSH   . Arthritis    ddd  . Atrial fibrillation and flutter ([place])    s/p DCCV 03/26/2018  . CAD in native artery    a. 11/2015: s/p 2 vessel PCI of the first diagonal branch of the LAD in the mid left circumflex with DES. // s/p CABG 01/2018  . Chronic combined systolic and diastolic CHF (congestive heart failure) ([place])    Echo 10/19 (prior to MVR, CABG):  Mild LVH, anteroseptal and apical hypokinesis, EF 40, grade 2 diastolic dysfunction, MAC, moderate to severe MR, moderate to severe LAE, normal RV SF, PASP 39, trivial effusion  . Complete heart block (HCC)    a. s/p Medtronic ppm 10/2015.  Marland Kitchen History of radiation therapy 2002  . Hodgkin disease ([place]) 2002   a. s/p chest radiation and chemotherapy  . Hypothyroidism   . Incidental pulmonary nodule 01/19/2018   Left apical opacity - possible scarring  . Mitral regurgitation    s/p bioprosthetic MV  replacement 01/2018  . Pacemaker 10/2015   Medtronic  . PVC's (premature ventricular contractions)   . Right bundle branch block   . 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 S/P mitral valve replacement with bioprosthetic valve 01/27/2018   29 mm [facility] Mitral stented bovine pericardial tissue valve   Past Surgical History:  Procedure Laterality Date  . CARDIAC CATHETERIZATION N/A 11/02/2015   Procedure: Left Heart Cath and Coronary Angiography;  Surgeon: Sherren Mocha, MD;  Location: [facility];  Service: Cardiovascular;  Laterality: N/A;  . CARDIAC CATHETERIZATION N/A 11/21/2015   Procedure: Coronary Stent Intervention;  Surgeon: Sherren Mocha, MD;  Location: [facility];  Service: Cardiovascular;  Laterality: N/A;  . CARDIAC CATHETERIZATION  11/2017  . CARDIOVERSION N/A 03/26/2018   Procedure: CARDIOVERSION;  Surgeon: [name], MD;  Location: [facility] ENDOSCOPY;  Service: Cardiovascular;  Laterality: N/A;  . CORONARY ARTERY BYPASS GRAFT N/A 01/27/2018   Procedure: CORONARY ARTERY BYPASS GRAFTING (CABG) x two, using left internal mammary artery and right leg greater saphenous vein harvested endoscopically;  Surgeon: Rexene Alberts, MD;  Location: [facility];  Service: Open Heart Surgery;  Laterality: N/A;  . EP IMPLANTABLE DEVICE N/A 11/02/2015   MDT Adivisa MRI conditional dual chamber pacemaker implanted by  Dr Rayann Heman for complete heart block  . KNEE SURGERY Right 1972  . LUMBAR LAMINECTOMY  1996  . MITRAL VALVE REPLACEMENT N/A 01/27/2018   Procedure: MITRAL VALVE (MV) REPLACEMENT using Magna Mitral Ease Valve Size 29MM;  Surgeon: Rexene Alberts, MD;  Location: [facility];  Service: Open Heart Surgery;  Laterality: N/A;  . RIGHT/LEFT HEART CATH AND CORONARY ANGIOGRAPHY N/A 12/02/2017   Procedure: RIGHT/LEFT HEART CATH AND CORONARY ANGIOGRAPHY;  Surgeon: Jettie Booze, MD;  Location: [facility];  Service: Cardiovascular;   Laterality: N/A;  . TEE WITHOUT CARDIOVERSION N/A 12/09/2017   Procedure: TRANSESOPHAGEAL ECHOCARDIOGRAM (TEE);  Surgeon: Buford Dresser, MD;  Location: [facility] ENDOSCOPY;  Service: Cardiovascular;  Laterality: N/A;  . TEE WITHOUT CARDIOVERSION N/A 01/27/2018   Procedure: TRANSESOPHAGEAL ECHOCARDIOGRAM (TEE);  Surgeon: Rexene Alberts, MD;  Location: [facility];  Service: Open Heart Surgery;  Laterality: N/A;   Social History   Socioeconomic History  . Marital status: Married    Spouse name: Not on file  . Number of children: 2  . Years of education: Masters  . Highest education level: Not on file  Occupational History  . Occupation: Chief Executive Officer    Comment: [place]  Social Needs  . Financial resource strain: Not on file  . Food insecurity    Worry: Not on file    Inability: Not on file  . Transportation needs    Medical: Not on file    Non-medical: Not on file  Tobacco Use  . Smoking status: Never Smoker  . Smokeless tobacco: Never Used  Substance and Sexual Activity  . Alcohol use: Yes    Alcohol/week: 5.0 standard drinks    Types: 5 Glasses of wine per week  . Drug use: Hendrix  . Sexual activity: Not on file  Lifestyle  . Physical activity    Days per week: Not on file    Minutes per session: Not on file  . Stress: Not on file  Relationships  . Social Herbalist on phone: Not on file    Gets together: Not on file    Attends religious service: Not on file    Active member of club or organization: Not on file    Attends meetings of clubs or organizations: Not on file    Relationship status: Not on file  Other Topics Concern  . 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  . Other     Pt reported allergic to dust mites that causes of sneezing, runny nose   Family History  Problem Relation Age of Onset  . Cancer Other        prostate father hx  . Heart disease Mother   .  Heart disease Father   . Colon cancer Neg Hx     Current Outpatient Medications (Endocrine & Metabolic):  .  levothyroxine (SYNTHROID, LEVOTHROID) 75 MCG tablet, Take 1 tablet (75 mcg total) by mouth daily before breakfast.  Current Outpatient Medications (Cardiovascular):  .  atorvastatin (LIPITOR) 80 MG tablet, TAKE 1 TABLET BY MOUTH  DAILY AT 6 PM. (Patient taking differently: Take 80 mg by mouth daily at 6 PM. ) .  carvedilol (COREG) 6.25 MG tablet, TAKE 1 TABLET BY MOUTH 2 TIMES DAILY WITH A MEAL .  ENTRESTO 24-26 MG, TAKE 1 TABLET BY MOUTH TWICE DAILY .  spironolactone (ALDACTONE) 25 MG tablet, Take  0.5 tablets (12.5 mg total) by mouth daily.   Current Outpatient Medications (Analgesics):  .  aspirin EC 81 MG tablet, Take 81 mg by mouth daily.  Current Outpatient Medications (Hematological):  .  warfarin (COUMADIN) 5 MG tablet, TAKE 1 TO 1 AND 1/2 TABLETS BY MOUTH DAILY AS DIRECTED BY COUMADIN CLINIC  Current Outpatient Medications (Other):  Marland Kitchen  Multiple Vitamin (MULTIVITAMIN WITH MINERALS) TABS tablet, Take 1 tablet by mouth daily. .  Potassium Chloride ER 20 MEQ TBCR, TAKE 1 TABLET BY MOUTH EVERY DAY WHILE TAKING FUROSEMIDE .  tiZANidine (ZANAFLEX) 4 MG tablet, Take 1 tablet (4 mg total) by mouth 3 (three) times daily as needed for muscle spasms. .  traZODone (DESYREL) 50 MG tablet, Take 1 tablet (50 mg total) by mouth at bedtime.    Past medical history, social, surgical and family history all reviewed in electronic medical record.  Hendrix pertanent information unless stated regarding to the chief complaint.   Review of Systems:  Hendrix headache, visual changes, nausea, vomiting, diarrhea, constipation, dizziness, abdominal pain, skin rash, fevers, chills, night sweats, weight loss, swollen lymph nodes, body aches, joint swelling, chest pain, shortness of breath, mood changes.  Positive muscle aches  Objective  Blood pressure 112/62, pulse 64, height _0  (1.803 m), weight 187 lb  (84.8 kg), SpO2 98 %.   General: Hendrix apparent distress alert and oriented x3 mood and affect normal, dressed appropriately.  HEENT: Pupils equal, extraocular movements intact  Respiratory: Patient's speak in full sentences and does not appear short of breath  Cardiovascular: Hendrix lower extremity edema, non tender, Hendrix erythema  Skin: Warm dry intact with Hendrix signs of infection or rash on extremities or on axial skeleton.  Abdomen: Soft nontender  Neuro: Cranial nerves II through XII are intact, neurovascularly intact in all extremities with 2+ DTRs and 2+ pulses.  Lymph: Hendrix lymphadenopathy of posterior or anterior cervical chain or axillae bilaterally.  Gait normal with good balance and coordination.  MSK:  Non tender with full range of motion and good stability and symmetric strength and tone of  elbows, wrist, hip, knee and ankles bilaterally.   Shoulder: Right Inspection reveals Hendrix abnormalities, atrophy or asymmetry. Palpation is normal with Hendrix tenderness over AC joint or bicipital groove.  Range of motion is decreased in all planes especially with forward flexion to 160 degrees Rotator cuff strength 4 out of 5 compared to the contralateral side Positive impingement  Speeds and Yergason's tests normal. Hendrix labral pathology noted with negative Obrien's, negative clunk and good stability. Normal scapular function observed. Hendrix painful arc and Hendrix drop arm sign. Hendrix apprehension sign Contralateral shoulder unremarkable  Limited musculoskeletal ultrasound was performed and interpreted by Tyler Hendrix Limited ultrasound shows the patient may have a little bit of a new onset of a subscapularis partial tearing as well as the supraspinatus partial tearing with Hendrix significant retraction.  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    Procedure: Real-time Ultrasound Guided Injection of right glenohumeral joint Device:  GE Logiq Q7  Ultrasound guided injection is preferred based studies that show increased duration, increased effect, greater accuracy, decreased procedural pain, increased response rate with ultrasound guided versus blind injection.  Verbal informed consent obtained.  Time-out conducted.  Noted Hendrix overlying erythema, induration, or other signs of local infection.  Skin prepped in a sterile fashion.  Local anesthesia: Topical Ethyl chloride.  With sterile technique and under real time  ultrasound guidance:  Joint visualized.  23g 1  inch needle inserted posterior approach. Pictures taken for needle placement. Patient did have injection of 2 cc of 1% lidocaine, 2 cc of 0.5% Marcaine, and 1.0 cc of Kenalog 40 mg/dL. Completed without difficulty  Pain immediately resolved suggesting accurate placement of the medication.  Advised to call if fevers/chills, erythema, induration, drainage, or persistent bleeding.  Images permanently stored and available for review in the ultrasound unit.  Impression: Technically successful ultrasound guided injection.   Procedure: Real-time Ultrasound Guided Injection of right acromioclavicular joint Device: GE Logiq Q7 Ultrasound guided injection is preferred based studies that show increased duration, increased effect, greater accuracy, decreased procedural pain, increased response rate, and decreased cost with ultrasound guided versus blind injection.  Verbal informed consent obtained.  Time-out conducted.  Noted Hendrix overlying erythema, induration, or other signs of local infection.  Skin prepped in a sterile fashion.  Local anesthesia: Topical Ethyl chloride.  With sterile technique and under real time ultrasound guidance: With a 25-gauge half inch needle injected with 0.5 cc of 0.5% Marcaine and 0.5 cc of Kenalog 40 mg/mL Completed without difficulty  Pain immediately resolved suggesting accurate placement of the medication.  Advised to call if fevers/chills,  erythema, induration, drainage, or persistent bleeding.  Images permanently stored and available for review in the ultrasound unit.  Impression: Technically successful ultrasound guided injection.     Impression and Recommendations:     This case required medical decision making of moderate complexity. The above documentation has been reviewed and is accurate and complete Tyler Pulley, DO       Note: This dictation was prepared with Dragon dictation along with smaller phrase technology. Any transcriptional errors that result from this process are unintentional.

## 2018-09-29 NOTE — Assessment & Plan Note (Signed)
Patient given injection today.  Tolerated the procedure well. Concern the patient is because more of a partial traumatic rotator cuff tear.  Tender greater tuberosity fracture previously.

## 2018-09-29 NOTE — Patient Instructions (Signed)
Will get MRA if pain continues [facility] [phone] Injected [facility] and shoulder joint today Message me and let me know how you are doing

## 2018-10-02 ENCOUNTER — Other Ambulatory Visit: Payer: Self-pay | Admitting: Physician Assistant

## 2018-10-05 ENCOUNTER — Telehealth: Payer: Self-pay

## 2018-10-05 ENCOUNTER — Other Ambulatory Visit: Payer: Self-pay

## 2018-10-05 DIAGNOSIS — G8929 Other chronic pain: Secondary | ICD-10-CM

## 2018-10-05 NOTE — Telephone Encounter (Signed)
Order changed to W and WO. [facility] will call patient.

## 2018-10-06 ENCOUNTER — Other Ambulatory Visit: Payer: Self-pay

## 2018-10-06 ENCOUNTER — Other Ambulatory Visit: Payer: Self-pay | Admitting: *Deleted

## 2018-10-06 ENCOUNTER — Ambulatory Visit (INDEPENDENT_AMBULATORY_CARE_PROVIDER_SITE_OTHER): Payer: 59 | Admitting: *Deleted

## 2018-10-06 DIAGNOSIS — Z953 Presence of xenogenic heart valve: Secondary | ICD-10-CM | POA: Diagnosis not present

## 2018-10-06 DIAGNOSIS — Z7901 Long term (current) use of anticoagulants: Secondary | ICD-10-CM | POA: Diagnosis not present

## 2018-10-06 DIAGNOSIS — I10 Essential (primary) hypertension: Secondary | ICD-10-CM

## 2018-10-06 LAB — POCT INR: INR: 3.2 — AB (ref 2.0–3.0)

## 2018-10-06 NOTE — Patient Instructions (Addendum)
Description   Hold tomorrow's dose then start taking 7.5mg  daily except 10mg  on Tuesdays.  Repeat INR in 3 weeks.     *`

## 2018-10-07 ENCOUNTER — Other Ambulatory Visit: Payer: Self-pay | Admitting: Internal Medicine

## 2018-10-08 ENCOUNTER — Other Ambulatory Visit (INDEPENDENT_AMBULATORY_CARE_PROVIDER_SITE_OTHER): Payer: 59

## 2018-10-08 DIAGNOSIS — I10 Essential (primary) hypertension: Secondary | ICD-10-CM | POA: Diagnosis not present

## 2018-10-08 LAB — BASIC METABOLIC PANEL
BUN: 24 mg/dL — ABNORMAL HIGH (ref 6–23)
CO2: 29 mEq/L (ref 19–32)
Calcium: 9.3 mg/dL (ref 8.4–10.5)
Chloride: 102 mEq/L (ref 96–112)
Creatinine, Ser: 1.15 mg/dL (ref 0.40–1.50)
GFR: 64.34 mL/min (ref >60.00)
Glucose, Bld: 94 mg/dL (ref 70–99)
Potassium: 4.5 mEq/L (ref 3.5–5.1)
Sodium: 138 mEq/L (ref 135–145)

## 2018-10-12 ENCOUNTER — Ambulatory Visit (INDEPENDENT_AMBULATORY_CARE_PROVIDER_SITE_OTHER)
Admission: RE | Admit: 2018-10-12 | Discharge: 2018-10-12 | Disposition: A | Discharge status: Home / Self Care | Payer: 59 | Source: Ambulatory Visit | Attending: Family Medicine | Admitting: Family Medicine

## 2018-10-12 ENCOUNTER — Other Ambulatory Visit: Payer: Self-pay

## 2018-10-12 DIAGNOSIS — G8929 Other chronic pain: Secondary | ICD-10-CM

## 2018-10-12 DIAGNOSIS — M25511 Pain in right shoulder: Secondary | ICD-10-CM | POA: Diagnosis not present

## 2018-10-12 MED ORDER — IOHEXOL 300 MG/ML  SOLN
80.0000 mL | Freq: Once | INTRAMUSCULAR | Status: AC | PRN
  Administered 2018-10-12: 80 mL via INTRAVENOUS

## 2018-10-13 ENCOUNTER — Encounter: Payer: Self-pay | Admitting: Family Medicine

## 2018-10-14 ENCOUNTER — Encounter: Payer: Self-pay | Admitting: Family Medicine

## 2018-10-26 ENCOUNTER — Other Ambulatory Visit: Payer: Self-pay

## 2018-10-26 MED ORDER — LEVOTHYROXINE SODIUM 75 MCG PO TABS: 75.0000 ug | ORAL_TABLET | Freq: Every day | ORAL | 3 refills | Status: DC

## 2018-10-26 NOTE — Telephone Encounter (Signed)
Last fill 04/16/18  #45/3 Last OV 04/16/18

## 2018-10-27 ENCOUNTER — Ambulatory Visit (INDEPENDENT_AMBULATORY_CARE_PROVIDER_SITE_OTHER): Payer: 59 | Admitting: *Deleted

## 2018-10-27 ENCOUNTER — Other Ambulatory Visit: Payer: Self-pay

## 2018-10-27 DIAGNOSIS — I480 Paroxysmal atrial fibrillation: Secondary | ICD-10-CM

## 2018-10-27 DIAGNOSIS — Z7901 Long term (current) use of anticoagulants: Secondary | ICD-10-CM

## 2018-10-27 LAB — POCT INR: INR: 2.1 (ref 2.0–3.0)

## 2018-10-27 NOTE — Patient Instructions (Signed)
Description   Continue taking 1.5 tablet daily except for 2 tablets on Saturday. [facility] [phone]  Repeat INR in 4 weeks.

## 2018-11-01 ENCOUNTER — Other Ambulatory Visit: Payer: Self-pay

## 2018-11-01 DIAGNOSIS — G8929 Other chronic pain: Secondary | ICD-10-CM

## 2018-11-03 ENCOUNTER — Other Ambulatory Visit: Payer: Self-pay

## 2018-11-03 MED ORDER — OLMESARTAN MEDOXOMIL 20 MG PO TABS: 10.0000 mg | ORAL_TABLET | Freq: Every day | ORAL | 6 refills | Status: DC

## 2018-11-10 ENCOUNTER — Other Ambulatory Visit: Payer: Self-pay

## 2018-11-10 MED ORDER — OLMESARTAN MEDOXOMIL 20 MG PO TABS: 10.0000 mg | ORAL_TABLET | Freq: Every day | ORAL | 6 refills | Status: DC

## 2018-11-13 ENCOUNTER — Other Ambulatory Visit: Payer: Self-pay | Admitting: Internal Medicine

## 2018-11-16 DIAGNOSIS — H5711 Ocular pain, right eye: Secondary | ICD-10-CM | POA: Diagnosis not present

## 2018-11-23 ENCOUNTER — Encounter: Payer: 59 | Admitting: Family Medicine

## 2018-11-25 ENCOUNTER — Other Ambulatory Visit: Payer: Self-pay

## 2018-11-25 ENCOUNTER — Ambulatory Visit (INDEPENDENT_AMBULATORY_CARE_PROVIDER_SITE_OTHER): Payer: 59 | Admitting: *Deleted

## 2018-11-25 DIAGNOSIS — Z7901 Long term (current) use of anticoagulants: Secondary | ICD-10-CM

## 2018-11-25 DIAGNOSIS — Z953 Presence of xenogenic heart valve: Secondary | ICD-10-CM | POA: Diagnosis not present

## 2018-11-25 LAB — POCT INR: INR: 2.9 (ref 2.0–3.0)

## 2018-11-25 NOTE — Patient Instructions (Addendum)
Description   Continue taking 1.5 tablet daily except for 2 tablets on Saturday. [facility] [phone]  Repeat INR in 5 weeks. [facility] [phone]

## 2018-12-08 ENCOUNTER — Other Ambulatory Visit: Payer: Self-pay | Admitting: Internal Medicine

## 2018-12-08 MED ORDER — CARVEDILOL 6.25 MG PO TABS: ORAL_TABLET | ORAL | 2 refills | Status: DC

## 2018-12-23 ENCOUNTER — Telehealth: Payer: Self-pay | Admitting: Internal Medicine

## 2018-12-23 DIAGNOSIS — I428 Other cardiomyopathies: Secondary | ICD-10-CM

## 2018-12-23 DIAGNOSIS — I5022 Chronic systolic (congestive) heart failure: Secondary | ICD-10-CM

## 2018-12-23 NOTE — Telephone Encounter (Signed)
New message   Patient wants to discuss replacing pacemaker and putting in a 3rd lead. Please call to discuss.

## 2018-12-23 NOTE — Telephone Encounter (Signed)
Lpm that I would forward to Dr Jackalyn Lombard nurse to f/u on question regarding a pacemaker upgrade. 11/12

## 2018-12-24 NOTE — Telephone Encounter (Signed)
Call returned to Pt.  Pt scheduled for upgrade to BIV PPM on December 29.  Will get labs on December 18.  Covid test December 26  Will clarify warfarin instructions and then complete instruction letter for pick up with surgical scrub on December 18

## 2018-12-25 MED FILL — WARFARIN SODIUM 5 MG TABLET: 5 | 90 days supply | Qty: 135 | Fill #0

## 2018-12-27 ENCOUNTER — Telehealth: Payer: Self-pay

## 2018-12-27 NOTE — Telephone Encounter (Signed)
Hold warfarin for 2 days prior to procedure.  Instruction letter complete.  Will place for pick up  Work up complete

## 2018-12-27 NOTE — Telephone Encounter (Signed)
LMOVM to help pt send a manual transmission with his home monitor.

## 2018-12-27 NOTE — Telephone Encounter (Signed)
**Note De-Identified Tyler Hendrix Obfuscation** I have started a Entresto PA through covermymeds. Key: [id number]

## 2018-12-27 NOTE — Telephone Encounter (Signed)
-----   Message from Damian Leavell, RN sent at 12/24/2018 10:58 AM EST ----- Regarding: pt overdue for remote monitoring

## 2018-12-29 NOTE — Telephone Encounter (Signed)
LMOVM

## 2018-12-30 ENCOUNTER — Ambulatory Visit (INDEPENDENT_AMBULATORY_CARE_PROVIDER_SITE_OTHER): Payer: 59 | Admitting: *Deleted

## 2018-12-30 ENCOUNTER — Other Ambulatory Visit: Payer: Self-pay

## 2018-12-30 ENCOUNTER — Ambulatory Visit (INDEPENDENT_AMBULATORY_CARE_PROVIDER_SITE_OTHER): Payer: 59 | Admitting: Pharmacist

## 2018-12-30 DIAGNOSIS — Z7901 Long term (current) use of anticoagulants: Secondary | ICD-10-CM | POA: Diagnosis not present

## 2018-12-30 DIAGNOSIS — I480 Paroxysmal atrial fibrillation: Secondary | ICD-10-CM | POA: Diagnosis not present

## 2018-12-30 DIAGNOSIS — I5021 Acute systolic (congestive) heart failure: Secondary | ICD-10-CM

## 2018-12-30 LAB — CUP PACEART REMOTE DEVICE CHECK
Battery Remaining Longevity: 74 mo
Battery Voltage: 3.01 V
Brady Statistic AP VP Percent: 45.74 %
Brady Statistic AP VS Percent: 0.03 %
Brady Statistic AS VP Percent: 54.07 %
Brady Statistic AS VS Percent: 0.17 %
Brady Statistic RA Percent Paced: 45.62 %
Brady Statistic RV Percent Paced: 99.23 %
Date Time Interrogation Session: 20201118201014
Implantable Lead Implant Date: 20170922
Implantable Lead Implant Date: 20170922
Implantable Lead Location: 753859
Implantable Lead Location: 753860
Implantable Lead Model: 5076
Implantable Lead Model: 5076
Implantable Pulse Generator Implant Date: 20170922
Lead Channel Impedance Value: 323 Ohm
Lead Channel Impedance Value: 380 Ohm
Lead Channel Impedance Value: 399 Ohm
Lead Channel Impedance Value: 532 Ohm
Lead Channel Pacing Threshold Amplitude: 0.5 V
Lead Channel Pacing Threshold Amplitude: 0.875 V
Lead Channel Pacing Threshold Pulse Width: 0.4 ms
Lead Channel Pacing Threshold Pulse Width: 0.4 ms
Lead Channel Sensing Intrinsic Amplitude: 31.625 mV
Lead Channel Sensing Intrinsic Amplitude: 31.625 mV
Lead Channel Sensing Intrinsic Amplitude: 4 mV
Lead Channel Sensing Intrinsic Amplitude: 4 mV
Lead Channel Setting Pacing Amplitude: 2 V
Lead Channel Setting Pacing Amplitude: 2.5 V
Lead Channel Setting Pacing Pulse Width: 0.4 ms
Lead Channel Setting Sensing Sensitivity: 2 mV

## 2018-12-30 LAB — POCT INR: INR: 2.3 (ref 2.0–3.0)

## 2018-12-30 NOTE — Patient Instructions (Signed)
Description   Continue taking 1.5 tablet daily except for 2 tablets on Saturday. [facility] [phone]  Repeat INR in 7 weeks.

## 2018-12-30 NOTE — Telephone Encounter (Signed)
Transmission received 12/29/18 and added to schedule for processing.

## 2019-01-04 MED FILL — ENTRESTO 24 MG-26 MG TABLET: 24-26 | 30 days supply | Qty: 60 | Fill #0

## 2019-01-04 MED FILL — CARVEDILOL 6.25 MG TABLET: 6.25 | 30 days supply | Qty: 60 | Fill #0

## 2019-01-17 ENCOUNTER — Encounter: Payer: 59 | Admitting: Thoracic Surgery (Cardiothoracic Vascular Surgery)

## 2019-01-20 NOTE — Telephone Encounter (Signed)
Letter received from [name] stating that Tyler Hendrix is covered by the pts plan so this PA is not required. Prior Authorization reference #: [id number]  Letter states that they have notified the pt and his pharmacy.

## 2019-01-25 ENCOUNTER — Telehealth (HOSPITAL_COMMUNITY): Payer: Self-pay

## 2019-01-25 NOTE — Telephone Encounter (Signed)
New message    Call the patient to set up an echocardiogram order by Dr. Rayann Heman on  07/23/18.  The patient is asking for clarification from the nurse to see if this is necessary given the upcoming procedure.  The patient is asking for a callback.

## 2019-01-26 NOTE — Telephone Encounter (Signed)
See Mychart message.  Will advise to NOT repeat ECHO at this time.  Pt scheduled for BIV upgrade.

## 2019-01-28 ENCOUNTER — Other Ambulatory Visit: Payer: Self-pay

## 2019-01-28 ENCOUNTER — Other Ambulatory Visit: Payer: 59 | Admitting: *Deleted

## 2019-01-28 DIAGNOSIS — I428 Other cardiomyopathies: Secondary | ICD-10-CM

## 2019-01-28 DIAGNOSIS — I5022 Chronic systolic (congestive) heart failure: Secondary | ICD-10-CM | POA: Diagnosis not present

## 2019-01-28 NOTE — Progress Notes (Signed)
Remote pacemaker transmission.   

## 2019-01-29 LAB — CBC WITH DIFFERENTIAL/PLATELET
Basophils Absolute: 0.1 10*3/uL (ref 0.0–0.2)
Basos: 1 %
EOS (ABSOLUTE): 0.2 10*3/uL (ref 0.0–0.4)
Eos: 3 %
Hematocrit: 43.9 % (ref 37.5–51.0)
Hemoglobin: 15.2 g/dL (ref 13.0–17.7)
Immature Grans (Abs): 0 10*3/uL (ref 0.0–0.1)
Immature Granulocytes: 0 %
Lymphocytes Absolute: 1.2 10*3/uL (ref 0.7–3.1)
Lymphs: 13 %
MCH: 32.9 pg (ref 26.6–33.0)
MCHC: 34.6 g/dL (ref 31.5–35.7)
MCV: 95 fL (ref 79–97)
Monocytes Absolute: 0.4 10*3/uL (ref 0.1–0.9)
Monocytes: 5 %
Neutrophils Absolute: 7.4 10*3/uL — ABNORMAL HIGH (ref 1.4–7.0)
Neutrophils: 78 %
Platelets: 316 10*3/uL (ref 150–450)
RBC: 4.62 x10E6/uL (ref 4.14–5.80)
RDW: 12.3 % (ref 11.6–15.4)
WBC: 9.3 10*3/uL (ref 3.4–10.8)

## 2019-01-29 LAB — BASIC METABOLIC PANEL
BUN/Creatinine Ratio: 12 (ref 10–24)
BUN: 16 mg/dL (ref 8–27)
CO2: 20 mmol/L (ref 20–29)
Calcium: 9.9 mg/dL (ref 8.6–10.2)
Chloride: 103 mmol/L (ref 96–106)
Creatinine, Ser: 1.3 mg/dL — ABNORMAL HIGH (ref 0.76–1.27)
GFR calc Af Amer: 68 mL/min/{1.73_m2} (ref >59)
GFR calc non Af Amer: 58 mL/min/{1.73_m2} — ABNORMAL LOW (ref >59)
Glucose: 130 mg/dL — ABNORMAL HIGH (ref 65–99)
Potassium: 4.7 mmol/L (ref 3.5–5.2)
Sodium: 142 mmol/L (ref 134–144)

## 2019-01-31 ENCOUNTER — Encounter: Payer: 59 | Admitting: Internal Medicine

## 2019-02-03 MED FILL — LEVOTHYROXINE SODIUM 75 MCG: 75 | 30 days supply | Qty: 30 | Fill #0

## 2019-02-03 MED FILL — CARVEDILOL 6.25 MG TABLET: 6.25 | 30 days supply | Qty: 60 | Fill #1

## 2019-02-03 MED FILL — ENTRESTO 24 MG-26 MG TABLET: 24-26 | 30 days supply | Qty: 60 | Fill #1

## 2019-02-03 MED FILL — SPIRONOLACTONE 25 MG TABS: 25 | 90 days supply | Qty: 45 | Fill #0

## 2019-02-05 ENCOUNTER — Other Ambulatory Visit (HOSPITAL_COMMUNITY)
Admission: RE | Admit: 2019-02-05 | Discharge: 2019-02-05 | Disposition: A | Discharge status: Home / Self Care | Payer: 59 | Source: Ambulatory Visit | Attending: Internal Medicine | Admitting: Internal Medicine

## 2019-02-05 DIAGNOSIS — Z20828 Contact with and (suspected) exposure to other viral communicable diseases: Secondary | ICD-10-CM | POA: Diagnosis not present

## 2019-02-05 DIAGNOSIS — Z01812 Encounter for preprocedural laboratory examination: Secondary | ICD-10-CM | POA: Diagnosis not present

## 2019-02-05 LAB — SARS CORONAVIRUS 2 (TAT 6-24 HRS): SARS Coronavirus 2: NEGATIVE

## 2019-02-08 ENCOUNTER — Ambulatory Visit (HOSPITAL_COMMUNITY)
Admission: RE | Disposition: A | Discharge status: Home / Self Care | Payer: 59 | Source: Home / Self Care | Attending: Internal Medicine

## 2019-02-08 ENCOUNTER — Ambulatory Visit (HOSPITAL_COMMUNITY): Payer: 59

## 2019-02-08 ENCOUNTER — Ambulatory Visit (HOSPITAL_COMMUNITY)
Admission: RE | Admit: 2019-02-08 | Discharge: 2019-02-08 | Disposition: A | Discharge status: Home / Self Care | Payer: 59 | Attending: Internal Medicine | Admitting: Internal Medicine

## 2019-02-08 DIAGNOSIS — I442 Atrioventricular block, complete: Secondary | ICD-10-CM | POA: Insufficient documentation

## 2019-02-08 DIAGNOSIS — Z79899 Other long term (current) drug therapy: Secondary | ICD-10-CM | POA: Insufficient documentation

## 2019-02-08 DIAGNOSIS — I2510 Atherosclerotic heart disease of native coronary artery without angina pectoris: Secondary | ICD-10-CM | POA: Insufficient documentation

## 2019-02-08 DIAGNOSIS — I5042 Chronic combined systolic (congestive) and diastolic (congestive) heart failure: Secondary | ICD-10-CM | POA: Diagnosis not present

## 2019-02-08 DIAGNOSIS — Z4502 Encounter for adjustment and management of automatic implantable cardiac defibrillator: Secondary | ICD-10-CM | POA: Insufficient documentation

## 2019-02-08 DIAGNOSIS — E039 Hypothyroidism, unspecified: Secondary | ICD-10-CM | POA: Insufficient documentation

## 2019-02-08 DIAGNOSIS — I4891 Unspecified atrial fibrillation: Secondary | ICD-10-CM | POA: Diagnosis not present

## 2019-02-08 DIAGNOSIS — Z7901 Long term (current) use of anticoagulants: Secondary | ICD-10-CM | POA: Diagnosis not present

## 2019-02-08 DIAGNOSIS — Z79890 Hormone replacement therapy (postmenopausal): Secondary | ICD-10-CM | POA: Diagnosis not present

## 2019-02-08 DIAGNOSIS — I428 Other cardiomyopathies: Secondary | ICD-10-CM | POA: Insufficient documentation

## 2019-02-08 DIAGNOSIS — Z950 Presence of cardiac pacemaker: Secondary | ICD-10-CM | POA: Diagnosis not present

## 2019-02-08 DIAGNOSIS — Z7982 Long term (current) use of aspirin: Secondary | ICD-10-CM | POA: Insufficient documentation

## 2019-02-08 DIAGNOSIS — M1990 Unspecified osteoarthritis, unspecified site: Secondary | ICD-10-CM | POA: Insufficient documentation

## 2019-02-08 DIAGNOSIS — Z951 Presence of aortocoronary bypass graft: Secondary | ICD-10-CM | POA: Diagnosis not present

## 2019-02-08 DIAGNOSIS — I5022 Chronic systolic (congestive) heart failure: Secondary | ICD-10-CM | POA: Diagnosis not present

## 2019-02-08 HISTORY — PX: BIV UPGRADE: EP1202

## 2019-02-08 LAB — PROTIME-INR
INR: 1 (ref 0.8–1.2)
Prothrombin Time: 13.5 seconds (ref 11.4–15.2)

## 2019-02-08 SURGERY — BIV UPGRADE

## 2019-02-08 MED ORDER — ONDANSETRON HCL 4 MG/2ML IJ SOLN: 4.0000 mg | Freq: Four times a day (QID) | INTRAMUSCULAR | Status: DC | PRN

## 2019-02-08 MED ORDER — SODIUM CHLORIDE 0.9 % IV SOLN: INTRAVENOUS | Status: DC

## 2019-02-08 MED ORDER — BUPIVACAINE HCL (PF) 0.25 % IJ SOLN
INTRAMUSCULAR | Status: DC | PRN
  Administered 2019-02-08: 60 mL

## 2019-02-08 MED ORDER — CHLORHEXIDINE GLUCONATE 4 % EX LIQD: 4.0000 "application " | Freq: Once | CUTANEOUS | Status: DC

## 2019-02-08 MED ORDER — LIDOCAINE HCL (PF) 1 % IJ SOLN
INTRAMUSCULAR | Status: AC
  Filled 2019-02-08: qty 60

## 2019-02-08 MED ORDER — FENTANYL CITRATE (PF) 100 MCG/2ML IJ SOLN
INTRAMUSCULAR | Status: DC | PRN
  Administered 2019-02-08: 25 ug via INTRAVENOUS
  Administered 2019-02-08 (×2): 12.5 ug via INTRAVENOUS
  Administered 2019-02-08: 25 ug via INTRAVENOUS

## 2019-02-08 MED ORDER — SODIUM CHLORIDE 0.9 % IV SOLN
80.0000 mg | INTRAVENOUS | Status: AC
  Administered 2019-02-08: 80 mg

## 2019-02-08 MED ORDER — HEPARIN (PORCINE) IN NACL 1000-0.9 UT/500ML-% IV SOLN
INTRAVENOUS | Status: DC | PRN
  Administered 2019-02-08: 500 mL

## 2019-02-08 MED ORDER — SODIUM CHLORIDE 0.9% FLUSH: 3.0000 mL | INTRAVENOUS | Status: DC | PRN

## 2019-02-08 MED ORDER — MIDAZOLAM HCL 5 MG/5ML IJ SOLN
INTRAMUSCULAR | Status: AC
  Filled 2019-02-08: qty 5

## 2019-02-08 MED ORDER — SODIUM CHLORIDE 0.9 % IV SOLN
INTRAVENOUS | Status: AC
  Filled 2019-02-08: qty 2

## 2019-02-08 MED ORDER — ACETAMINOPHEN 325 MG PO TABS
325.0000 mg | ORAL_TABLET | ORAL | Status: DC | PRN
  Filled 2019-02-08: qty 2

## 2019-02-08 MED ORDER — SODIUM CHLORIDE 0.9 % IV SOLN: 250.0000 mL | INTRAVENOUS | Status: DC | PRN

## 2019-02-08 MED ORDER — SODIUM CHLORIDE 0.9% FLUSH: 3.0000 mL | Freq: Two times a day (BID) | INTRAVENOUS | Status: DC

## 2019-02-08 MED ORDER — MIDAZOLAM HCL 5 MG/5ML IJ SOLN
INTRAMUSCULAR | Status: DC | PRN
  Administered 2019-02-08: 2 mg via INTRAVENOUS
  Administered 2019-02-08 (×3): 1 mg via INTRAVENOUS

## 2019-02-08 MED ORDER — CEFAZOLIN SODIUM-DEXTROSE 2-4 GM/100ML-% IV SOLN
INTRAVENOUS | Status: AC
  Filled 2019-02-08: qty 100

## 2019-02-08 MED ORDER — IOHEXOL 350 MG/ML SOLN
INTRAVENOUS | Status: DC | PRN
  Administered 2019-02-08 (×2): 15 mL
  Administered 2019-02-08: 19 mL

## 2019-02-08 MED ORDER — HEPARIN (PORCINE) IN NACL 1000-0.9 UT/500ML-% IV SOLN
INTRAVENOUS | Status: AC
  Filled 2019-02-08: qty 500

## 2019-02-08 MED ORDER — CEFAZOLIN SODIUM-DEXTROSE 2-4 GM/100ML-% IV SOLN
2.0000 g | INTRAVENOUS | Status: AC
  Administered 2019-02-08: 08:00:00 2 g via INTRAVENOUS

## 2019-02-08 MED ORDER — FENTANYL CITRATE (PF) 100 MCG/2ML IJ SOLN
INTRAMUSCULAR | Status: AC
  Filled 2019-02-08: qty 2

## 2019-02-08 SURGICAL SUPPLY — 20 items
ADAPTER SEALING SSA-EW-09 (MISCELLANEOUS) ×2 IMPLANT
BALLN ATTAIN 80 (BALLOONS) ×2
BALLOON ATTAIN 80 (BALLOONS) ×1 IMPLANT
CABLE SURGICAL S-101-97-12 (CABLE) ×2 IMPLANT
CATH ATTAIN COM SURV 6250V-EH (CATHETERS) ×2 IMPLANT
CATH ATTAIN COM SURV 6250V-MB2 (CATHETERS) ×2 IMPLANT
CATH ATTAIN SEL SURV 6248V-130 (CATHETERS) ×2 IMPLANT
CATH HEX JOSEPH 2-5-2 65CM 6F (CATHETERS) ×2 IMPLANT
GUIDEWIRE ANGLED .035X150CM (WIRE) ×2 IMPLANT
KIT MICROPUNCTURE NIT STIFF (SHEATH) ×4 IMPLANT
LEAD ATTAIN PERFORMA S 4598-88 (Lead) ×2 IMPLANT
PACEMAKER PRCT MRI CRTP W1TR01 (Pacemaker) ×1 IMPLANT
PAD PRO RADIOLUCENT 2001M-C (PAD) ×2 IMPLANT
PPM PRECEPTA MRI CRT-P W1TR01 (Pacemaker) ×2 IMPLANT
SHEATH 9.5FR PRELUDE SNAP 13 (SHEATH) ×2 IMPLANT
SHEATH PINNACLE 6F 10CM (SHEATH) ×2 IMPLANT
SLITTER 6232ADJ (MISCELLANEOUS) ×2 IMPLANT
TRAY PACEMAKER INSERTION (PACKS) ×2 IMPLANT
WIRE ACUITY WHISPER EDS 4648 (WIRE) ×2 IMPLANT
WIRE HI TORQ VERSACORE-J 145CM (WIRE) ×2 IMPLANT

## 2019-02-08 NOTE — Discharge Instructions (Signed)
Resume previous home warfarin regimen 02/09/2019. Outer dressing off in 24 hours. Keep site dry. After Your Pacemaker  . You have a MEDTRONIC Pacemaker  . Do not lift your arm above shoulder height for 1 week after your procedure. After 7 days, you may progress as below.     Tuesday February 15, 2019  Wednesday February 16, 2019 Thursday February 17, 2019 Friday February 18, 2019   . Do not lift, push, pull, or carry anything over 10 pounds with the affected arm until 6 weeks (Tuesday March 22, 2019) after your procedure.   . Do not drive until your wound check, or until instructed by your healthcare provider that you are safe to do so.   . Monitor your pacemaker site for redness, swelling, and drainage. Call the device clinic at [phone] if you experience these symptoms or fever/chills.  . If your incision is sealed with Steri-strips or staples, you may shower 7 days after your procedure. Do not remove the steri-strips or let the shower hit directly on your site. You may wash around your site with soap and water. If your incision is closed with Dermabond/Surgical glue. You may shower 1 day after your pacemaker implant and wash around the site with soap and water. Avoid lotions, ointments, or perfumes over your incision until it is well-healed.  . You may use a hot tub or a pool AFTER your wound check appointment if the incision is completely closed.  . Your Pacemaker may be MRI compatible. We will discuss this at your first follow up/wound check. .   . Remote monitoring is used to monitor your pacemaker from home. This monitoring is scheduled every 91 days by our office. It allows [place] to keep an eye on the functioning of your device to ensure it is working properly. You will routinely see your Electrophysiologist annually (more often if necessary).    Pacemaker Implantation, Care After This sheet gives you information about how to care for yourself after your procedure. Your health care  provider may also give you more specific instructions. If you have problems or questions, contact your health care provider. What can I expect after the procedure? After the procedure, it is common to have:  Mild pain.  Slight bruising.  Some swelling over the incision.  A slight bump over the skin where the device was placed. Sometimes, it is possible to feel the device under the skin. This is normal.  You should received your Pacemaker ID card within 4-8 weeks. Follow these instructions at home: Medicines  Take over-the-counter and prescription medicines only as told by your health care provider.  If you were prescribed an antibiotic medicine, take it as told by your health care provider. Do not stop taking the antibiotic even if you start to feel better. Wound care     Do not remove the bandage on your chest until directed to do so by your health care provider.  After your bandage is removed, you may see pieces of tape called skin adhesive strips over the area where the cut was made (incision site). Let them fall off on their own.  Check the incision site every day to make sure it is not infected, bleeding, or starting to pull apart.  Do not use lotions or ointments near the incision site unless directed to do so.  Keep the incision area clean and dry for 7 days after the procedure or as directed by your health care provider. It takes several weeks for  the incision site to completely heal.  Do not take baths, swim, or use a hot tub for 7-10 days or as otherwise directed by your health care provider. Activity  Do not drive or use heavy machinery while taking prescription pain medicine.  Do not drive for 24 hours if you were given a medicine to help you relax (sedative).  Check with your health care provider before you start to drive or play sports.  Avoid sudden jerking, pulling, or chopping movements that pull your upper arm far away from your body. Avoid these movements  for at least 6 weeks or as long as told by your health care provider.  Do not lift your upper arm above your shoulders for at least 6 weeks or as long as told by your health care provider. This means no tennis, golf, or swimming.  You may go back to work when your health care provider says it is okay. Pacemaker care  You may be shown how to transfer data from your pacemaker through the phone to your health care provider.  Always let all health care providers know about your pacemaker before you have any medical procedures or tests.  Wear a medical ID bracelet or necklace stating that you have a pacemaker. Carry a pacemaker ID card with you at all times.  Your pacemaker battery will last for 5-15 years. Routine checks by your health care provider will let the health care provider know when the battery is starting to run down. The pacemaker will need to be replaced when the battery starts to run down.  Do not use amateur Chief of Staff. Other electrical devices are safe to use, including power tools, lawn mowers, and speakers. If you are unsure of whether something is safe to use, ask your health care provider.  When using your cell phone, hold it to the ear opposite the pacemaker. Do not leave your cell phone in a pocket over the pacemaker.  Avoid places or objects that have a strong electric or magnetic field, including: ? Airport Herbalist. When at the airport, let officials know that you have a pacemaker. ? Power plants. ? Large electrical generators. ? Radiofrequency transmission towers, such as cell phone and radio towers. General instructions  Weigh yourself every day. If you suddenly gain weight, fluid may be building up in your body.  Keep all follow-up visits as told by your health care provider. This is important. Contact a health care provider if:  You gain weight suddenly.  Your legs or feet swell.  It feels like your heart is fluttering  or skipping beats (heart palpitations).  You have chills or a fever.  You have more redness, swelling, or pain around your incisions.  You have more fluid or blood coming from your incisions.  Your incisions feel warm to the touch.  You have pus or a bad smell coming from your incisions. Get help right away if:  You have chest pain.  You have trouble breathing or are short of breath.  You become extremely tired.  You are light-headed or you faint. This information is not intended to replace advice given to you by your health care provider. Make sure you discuss any questions you have with your health care provider.

## 2019-02-08 NOTE — H&P (Signed)
Chief Complaint:  CHF  History of Present Illness:    Tyler Hendrix is a 63 y.o. male who presents today for pacemaker upgrade to CRT-P.  Since last being seen in our clinic, the patient reports doing very well.  He remains active.  He fell and broke his shoulder in June.  He has recovered from this uneventfully without surgery required.  Today, he denies symptoms of palpitations, chest pain, shortness of breath,  lower extremity edema, dizziness, presyncope, or syncope.  The patient is otherwise without complaint today.  The patient denies symptoms of fevers, chills, cough, or new SOB worrisome for COVID 19.      Past Medical History:  Diagnosis Date  . Abnormal TSH   . Arthritis    ddd  . Atrial fibrillation and flutter ([place])    s/p DCCV 03/26/2018  . CAD in native artery    a. 11/2015: s/p 2 vessel PCI of the first diagonal branch of the LAD in the mid left circumflex with DES. // s/p CABG 01/2018  . Chronic combined systolic and diastolic CHF (congestive heart failure) ([place])    Echo 10/19 (prior to MVR, CABG):  Mild LVH, anteroseptal and apical hypokinesis, EF 40, grade 2 diastolic dysfunction, MAC, moderate to severe MR, moderate to severe LAE, normal RV SF, PASP 39, trivial effusion  . Complete heart block ([facility])    a. s/p Medtronic ppm 10/2015.  Marland Kitchen History of radiation therapy 2002  . Hodgkin disease ([place]) 2002   a. s/p chest radiation and chemotherapy  . Hypothyroidism   . Incidental pulmonary nodule 01/19/2018   Left apical opacity - possible scarring  . Mitral regurgitation    s/p bioprosthetic MV replacement 01/2018  . Pacemaker 10/2015   Medtronic  . PVC's (premature ventricular contractions)   . Right bundle branch block   . 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 S/P mitral valve replacement with bioprosthetic valve 01/27/2018   29 mm [facility] Mitral stented bovine pericardial tissue valve          Past Surgical History:  Procedure Laterality Date  . CARDIAC CATHETERIZATION N/A 11/02/2015   Procedure: Left Heart Cath and Coronary Angiography;  Surgeon: Sherren Mocha, MD;  Location: [place] [facility];  Service: Cardiovascular;  Laterality: N/A;  . CARDIAC CATHETERIZATION N/A 11/21/2015   Procedure: Coronary Stent Intervention;  Surgeon: Sherren Mocha, MD;  Location: [place] [facility];  Service: Cardiovascular;  Laterality: N/A;  . CARDIAC CATHETERIZATION  11/2017  . CARDIOVERSION N/A 03/26/2018   Procedure: CARDIOVERSION;  Surgeon: [name], MD;  Location: [facility] ENDOSCOPY;  Service: Cardiovascular;  Laterality: N/A;  . CORONARY ARTERY BYPASS GRAFT N/A 01/27/2018   Procedure: CORONARY ARTERY BYPASS GRAFTING (CABG) x two, using left internal mammary artery and right leg greater saphenous vein harvested endoscopically;  Surgeon: Rexene Alberts, MD;  Location: [place];  Service: Open Heart Surgery;  Laterality: N/A;  . EP IMPLANTABLE DEVICE N/A 11/02/2015   MDT Adivisa MRI conditional dual chamber pacemaker implanted by Dr Rayann Heman for complete heart block  . KNEE SURGERY Right 1972  . LUMBAR LAMINECTOMY  1996  . MITRAL VALVE REPLACEMENT N/A 01/27/2018   Procedure: MITRAL VALVE (MV) REPLACEMENT using Magna Mitral Ease Valve Size 29MM;  Surgeon: Rexene Alberts, MD;  Location: [place];  Service: Open Heart Surgery;  Laterality: N/A;  . RIGHT/LEFT HEART CATH AND CORONARY ANGIOGRAPHY N/A 12/02/2017  Procedure: RIGHT/LEFT HEART CATH AND CORONARY ANGIOGRAPHY;  Surgeon: Jettie Booze, MD;  Location: [place] [facility];  Service: Cardiovascular;  Laterality: N/A;  . TEE WITHOUT CARDIOVERSION N/A 12/09/2017   Procedure: TRANSESOPHAGEAL ECHOCARDIOGRAM (TEE);  Surgeon: Buford Dresser, MD;  Location: [facility] ENDOSCOPY;  Service: Cardiovascular;  Laterality: N/A;  . TEE WITHOUT CARDIOVERSION N/A 01/27/2018   Procedure: TRANSESOPHAGEAL ECHOCARDIOGRAM (TEE);  Surgeon: Rexene Alberts, MD;  [facility];  Service: Open Heart Surgery;  Laterality: N/A;          Current Outpatient Medications  Medication Sig Dispense Refill  . aspirin EC 81 MG tablet Take 81 mg by mouth daily.    Marland Kitchen atorvastatin (LIPITOR) 80 MG tablet TAKE 1 TABLET BY MOUTH  DAILY AT 6 PM. (Patient taking differently: Take 80 mg by mouth daily at 6 PM. ) 90 tablet 3  . carvedilol (COREG) 6.25 MG tablet Take 1 tablet (6.25 mg total) by mouth 2 (two) times daily with a meal. 60 tablet 0  . ENTRESTO 24-26 MG TAKE 1 TABLET BY MOUTH 2 TIMES DAILY 60 tablet 0  . levothyroxine (SYNTHROID, LEVOTHROID) 75 MCG tablet Take 1 tablet (75 mcg total) by mouth daily before breakfast. 45 tablet 3  . Multiple Vitamin (MULTIVITAMIN WITH MINERALS) TABS tablet Take 1 tablet by mouth daily.    . Potassium Chloride ER 20 MEQ TBCR TAKE 1 TABLET BY MOUTH EVERY DAY WHILE TAKING FUROSEMIDE 30 tablet 2  . spironolactone (ALDACTONE) 25 MG tablet Take 0.5 tablets (12.5 mg total) by mouth daily. 90 tablet 1  . traZODone (DESYREL) 50 MG tablet Take 1 tablet (50 mg total) by mouth at bedtime. 90 tablet 3  . warfarin (COUMADIN) 5 MG tablet Take 1 to 1.5 tablets by mouth daily as directed by coumadin clinic 135 tablet 0   No current facility-administered medications for this visit.     Allergies:   Other   Social History:  The patient  reports that he has never smoked. He has never used smokeless tobacco. He reports current alcohol use of about 5.0 standard drinks of alcohol per week. He reports that he does not use drugs.   Family History:  The patient's  family history includes Cancer in an other family member; Heart disease in his father and mother.   ROS:  Please see the history of present illness.   All other systems are personally reviewed and negative.    Physical Exam: Vitals:   02/08/19 0547  BP: (!) 151/82  Pulse: (!) 49  Resp: 18  Temp: 97.9 F (36.6 C)  TempSrc: Oral  SpO2: 100%  Weight:  83.5 kg  Height: _0  (1.803 m)    GEN- The patient is well appearing, alert and oriented x 3 today.   Head- normocephalic, atraumatic Eyes-  Sclera clear, conjunctiva pink Ears- hearing intact Oropharynx- clear Neck- supple, Lungs- normal work of breathing Heart- Regular rate and rhythm  GI- soft  MS- no significant deformity or atrophy Skin- no rash or lesion Psych- euthymic mood, full affect Neuro- strength and sensation are intact   Labs/Other Tests and Data Reviewed:    reviewed  Other studies personally reviewed: Additional studies/ records that were reviewed today include: recent echo, my prior notes.    Review of the above records today demonstrates: as above   Last device interrogation today reveals normal device function, no arrhythmias    ASSESSMENT & PLAN:    1.  Chronic systolic dysfunction EF remains stable  at 30-35%.  We discussed options of upgrade to CRT-P or CRT-D at length today.  using a shared decision making process, he is very clear that he would like to proceed with CRT-P rather than CRT-D.  Risks, benefits, and alternatives to CRT-P upgrade were discussed in detail today.  The patient understands that risks include but are not limited to bleeding, infection, pneumothorax, perforation, tamponade, vascular damage, renal failure, MI, stroke, death, damage to his existing leads, and lead dislodgement and wishes to proceed.     [name] MD, [place] 02/08/2019 7:20 AM

## 2019-02-08 NOTE — Progress Notes (Signed)
To cxr portable heart monitor on,

## 2019-02-09 MED FILL — Lidocaine HCl Local Preservative Free (PF) Inj 1%: INTRAMUSCULAR | Qty: 30 | Status: AC

## 2019-02-10 MED FILL — LEVOTHYROXINE SODIUM 75 MCG: 75 | 30 days supply | Qty: 30 | Fill #1

## 2019-02-15 ENCOUNTER — Other Ambulatory Visit: Payer: Self-pay | Admitting: Internal Medicine

## 2019-02-15 MED ORDER — ATORVASTATIN CALCIUM 80 MG PO TABS: 80.0000 mg | ORAL_TABLET | Freq: Every day | ORAL | 2 refills | Status: DC

## 2019-02-15 MED FILL — ATORVASTATIN 80 MG TABLET: 80 | 90 days supply | Qty: 90 | Fill #0

## 2019-02-15 NOTE — Telephone Encounter (Signed)
*  STAT* If patient is at the pharmacy, call can be transferred to refill team.   1. Which medications need to be refilled? (please list name of each medication and dose if known) atorvastatin (LIPITOR) 80 MG tablet  2. Which pharmacy/location (including street and city if local pharmacy) is medication to be sent to? [place], [place]  3. Do they need a 30 day or 90 day supply? 90  Patient out of medication

## 2019-02-15 NOTE — Telephone Encounter (Signed)
Rx has been sent to the pharmacy electronically. ° °

## 2019-02-17 ENCOUNTER — Other Ambulatory Visit: Payer: Self-pay

## 2019-02-17 ENCOUNTER — Ambulatory Visit (INDEPENDENT_AMBULATORY_CARE_PROVIDER_SITE_OTHER): Payer: 59 | Admitting: *Deleted

## 2019-02-17 DIAGNOSIS — Z5181 Encounter for therapeutic drug level monitoring: Secondary | ICD-10-CM

## 2019-02-17 DIAGNOSIS — Z7901 Long term (current) use of anticoagulants: Secondary | ICD-10-CM | POA: Diagnosis not present

## 2019-02-17 LAB — POCT INR: INR: 1.6 — AB (ref 2.0–3.0)

## 2019-02-17 NOTE — Patient Instructions (Addendum)
Description    Take 2 tablets today, then continue taking 1.5 tablet daily except for 2 tablets on Saturday. Coumadin Clinic [phone]  Repeat INR before wound check on 1/12. (pt usually goes 7 weeks).

## 2019-02-22 ENCOUNTER — Ambulatory Visit (INDEPENDENT_AMBULATORY_CARE_PROVIDER_SITE_OTHER): Payer: 59 | Admitting: *Deleted

## 2019-02-22 ENCOUNTER — Other Ambulatory Visit: Payer: Self-pay

## 2019-02-22 DIAGNOSIS — R001 Bradycardia, unspecified: Secondary | ICD-10-CM | POA: Diagnosis not present

## 2019-02-22 DIAGNOSIS — Z5181 Encounter for therapeutic drug level monitoring: Secondary | ICD-10-CM

## 2019-02-22 DIAGNOSIS — Z7901 Long term (current) use of anticoagulants: Secondary | ICD-10-CM

## 2019-02-22 LAB — CUP PACEART INCLINIC DEVICE CHECK
Battery Remaining Longevity: 103 mo
Battery Voltage: 3.18 V
Brady Statistic AP VP Percent: 47.54 %
Brady Statistic AP VS Percent: 0.04 %
Brady Statistic AS VP Percent: 51.38 %
Brady Statistic AS VS Percent: 1.04 %
Brady Statistic RA Percent Paced: 48.14 %
Brady Statistic RV Percent Paced: 98.91 %
Date Time Interrogation Session: 20210112092400
Implantable Lead Implant Date: 20170922
Implantable Lead Implant Date: 20170922
Implantable Lead Implant Date: 20201229
Implantable Lead Location: 753858
Implantable Lead Location: 753859
Implantable Lead Location: 753860
Implantable Lead Model: 4598
Implantable Lead Model: 5076
Implantable Lead Model: 5076
Implantable Pulse Generator Implant Date: 20201229
Lead Channel Impedance Value: 304 Ohm
Lead Channel Impedance Value: 361 Ohm
Lead Channel Impedance Value: 456 Ohm
Lead Channel Impedance Value: 513 Ohm
Lead Channel Impedance Value: 551 Ohm
Lead Channel Impedance Value: 589 Ohm
Lead Channel Impedance Value: 589 Ohm
Lead Channel Impedance Value: 608 Ohm
Lead Channel Impedance Value: 608 Ohm
Lead Channel Impedance Value: 608 Ohm
Lead Channel Pacing Threshold Amplitude: 0.5 V
Lead Channel Pacing Threshold Amplitude: 1 V
Lead Channel Pacing Threshold Amplitude: 1.75 V
Lead Channel Pacing Threshold Pulse Width: 0.4 ms
Lead Channel Pacing Threshold Pulse Width: 0.4 ms
Lead Channel Pacing Threshold Pulse Width: 1 ms
Lead Channel Sensing Intrinsic Amplitude: 3.625 mV
Lead Channel Setting Pacing Amplitude: 1.5 V
Lead Channel Setting Pacing Amplitude: 2.5 V
Lead Channel Setting Pacing Amplitude: 2.5 V
Lead Channel Setting Pacing Pulse Width: 0.4 ms
Lead Channel Setting Pacing Pulse Width: 1 ms
Lead Channel Setting Sensing Sensitivity: 4 mV

## 2019-02-22 LAB — POCT INR: INR: 1.9 — AB (ref 2.0–3.0)

## 2019-02-22 NOTE — Progress Notes (Signed)
Wound check appointment. Steri-strips removed. Wound without redness or edema. Incision edges approximated, wound well healed. Normal device function. Thresholds, sensing, and impedances consistent with implant measurements. Device LV lead programmed at 3.5V/auto capture programmed on for extra safety margin until 3 month visit. RA and RV leads programmed at chronic settings due to mature leads Histogram distribution appropriate for patient and level of activity. No mode switches or high ventricular rates noted. Patient educated about wound care, arm mobility, lifting restrictions. Remote transmission scheduled for 05/12/19 and follow up with Dr Rayann Heman 05/16/19.

## 2019-02-22 NOTE — Patient Instructions (Signed)
Call office if you have any drainage , redness, or swelling at wound site.

## 2019-02-22 NOTE — Patient Instructions (Signed)
Description    Take 2 tablets today, then continue taking 1.5 tablet daily except for 2 tablets on Saturday. [facility]in Clinic [phone]. Recheck INR in 3 weeks.  (pt usually goes 7 weeks).

## 2019-02-24 ENCOUNTER — Ambulatory Visit: Payer: 59 | Admitting: Internal Medicine

## 2019-03-06 MED FILL — CARVEDILOL 6.25 MG TABLET: 6.25 | 30 days supply | Qty: 60 | Fill #2

## 2019-03-08 MED FILL — ENTRESTO 24 MG-26 MG TABLET: 24-26 | 30 days supply | Qty: 60 | Fill #2

## 2019-03-08 MED FILL — LEVOTHYROXINE SODIUM 75 MCG: 75 | 30 days supply | Qty: 30 | Fill #2

## 2019-03-10 MED ORDER — CARVEDILOL 6.25 MG PO TABS: ORAL_TABLET | ORAL | 3 refills | Status: DC

## 2019-03-10 MED ORDER — SPIRONOLACTONE 25 MG PO TABS: 12.5000 mg | ORAL_TABLET | Freq: Every day | ORAL | 3 refills | Status: DC

## 2019-03-10 NOTE — Telephone Encounter (Signed)
PA for entresto submitted via CMM Key: [id number] - PA Case ID: [id number]

## 2019-03-16 NOTE — Telephone Encounter (Signed)
Per fax from [place], no pre-auth is needed for entresto as it is a covered benefit/drug

## 2019-03-18 ENCOUNTER — Ambulatory Visit (INDEPENDENT_AMBULATORY_CARE_PROVIDER_SITE_OTHER): Payer: 59 | Admitting: Pharmacist

## 2019-03-18 ENCOUNTER — Other Ambulatory Visit: Payer: Self-pay

## 2019-03-18 ENCOUNTER — Ambulatory Visit (INDEPENDENT_AMBULATORY_CARE_PROVIDER_SITE_OTHER): Payer: 59 | Admitting: Internal Medicine

## 2019-03-18 ENCOUNTER — Encounter: Payer: Self-pay | Admitting: Internal Medicine

## 2019-03-18 VITALS — BP 130/71 | HR 50 | Ht 71.0 in | Wt 187.6 lb

## 2019-03-18 DIAGNOSIS — Z952 Presence of prosthetic heart valve: Secondary | ICD-10-CM | POA: Diagnosis not present

## 2019-03-18 DIAGNOSIS — Z951 Presence of aortocoronary bypass graft: Secondary | ICD-10-CM

## 2019-03-18 DIAGNOSIS — I255 Ischemic cardiomyopathy: Secondary | ICD-10-CM | POA: Diagnosis not present

## 2019-03-18 DIAGNOSIS — I480 Paroxysmal atrial fibrillation: Secondary | ICD-10-CM | POA: Diagnosis not present

## 2019-03-18 DIAGNOSIS — Z7901 Long term (current) use of anticoagulants: Secondary | ICD-10-CM

## 2019-03-18 DIAGNOSIS — Z953 Presence of xenogenic heart valve: Secondary | ICD-10-CM

## 2019-03-18 LAB — POCT INR: INR: 2.2 (ref 2.0–3.0)

## 2019-03-18 MED ORDER — ENTRESTO 24-26 MG PO TABS: 1.0000 | ORAL_TABLET | Freq: Two times a day (BID) | ORAL | 3 refills | Status: DC

## 2019-03-18 NOTE — Patient Instructions (Signed)
Medication Instructions:  Your physician recommends that you continue on your current medications as directed. Please refer to the Current Medication list given to you today.  *If you need a refill on your cardiac medications before your next appointment, please call your pharmacy*  Lab Work: NONE If you have labs (blood work) drawn today and your tests are completely normal, you will receive your results only by: Marland Kitchen MyChart Message (if you have MyChart) OR . A paper copy in the mail If you have any lab test that is abnormal or we need to change your treatment, we will call you to review the results.  Testing/Procedures: Echo to be done at [address] - [address] Floor  Follow-Up: At [facility], you and your health needs are our priority.  As part of our continuing mission to provide you with exceptional heart care, we have created designated Provider Care Teams.  These Care Teams include your primary Cardiologist (physician) and Advanced Practice Providers (APPs -  Physician Assistants and Nurse Practitioners) who all work together to provide you with the care you need, when you need it.  Your next appointment:   6 month(s)  The format for your next appointment:   In Person  Provider:   You may see Pixie Casino, MD or one of the following Advanced Practice Providers on your designated Care Team:    Almyra Deforest, PA-C  Fabian Sharp, PA-C or   Roby Lofts, [place]   Other Instructions

## 2019-03-18 NOTE — Progress Notes (Signed)
OFFICE NOTE  Chief Complaint:  No complaints  Primary Care Physician: Laurey Morale, MD  HPI:  Tyler Hendrix is a pleasant 64 year old male who was previously seen by Dr. Acie Fredrickson in September of this past year. He is an add on to my schedule today for acute symptoms of palpitations. He reports over the past several days she's had skipped heartbeats and higher heart rates. He says his heart rate is typically in the 60s however recently has been in the 80s. He's also felt some skipped beats and they were happening 4-5 times a minute. He denies any chest pain or worsening shortness of breath. He's never had a stress test or any coronary disease evaluation. He does have a history of Hodgkin's lymphoma and had chest wall radiation. In September he had an echocardiogram which showed normal systolic function and it was felt that he was doing fairly well. He continued to do exercise which she has recently without any significant symptoms. 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   Mr. Silversmith returns today for follow-up. Overall he is feeling fairly well. He reports his palpitations have improved somewhat but he still feeling him. While wearing the monitor he was noted to have significant ventricular ectopy including isolated PVCs, and ventricular trigeminy and quadrigeminy. His nuclear stress test was negative for ischemia but was not gated. He did have an echo last fall which showed an EF of 55% which is reassuring.   At the pleasure see Mr. Niccoli back in the office today for follow-up. Overall he thinks he is doing much better on the high-dose of metoprolol 25 mg daily. Although he's tolerating this with very few palpitations, he still has some breakthrough in the afternoon. He generally takes the medicine in the morning. His EKG today however does show a low heart rate of 48, with a short  period to sinus rhythm and a competing junctional rhythm. I've had concerns about underlying conduction system disease based on his EKG showing a right bundle branch block and now there is some evidence of a junctional bradycardia. Despite this he has a good energy level, denies chest pain and is able to maintain normal physical activity. His wife recently had their new baby about 2 weeks ago.  06/22/2015  Mr. Memmott returns today for follow-up. He reports that he is overall done fairly well. He denies any worsening shortness of breath or fatigue. Heart rate remains in the upper 40s although he has very little ventricular ectopy and his EKG today. He denies any significant palpitations. I think we found a balance currently between low-dose beta blocker and his underlying conduction disease. Should he have more symptoms however he may need antiarrhythmic medication. I do not see any features that are concerning for pacemaker at this point. Blood pressure, however remains elevated and not at goal.  10/04/2015  Mr. Mascaro was seen back today in follow-up. He reports a marked improvement in his PVCs however over the past week he's felt slightly more short of breath with exertion. He did not voluntarily mention this yet required significant persistent questioning on my part to get an answer out of him. He also said that he might have a little pressure in his chest when he tried to exercise. He denied any pain, dizziness, presyncope or syncopal symptoms. Blood pressure is improved somewhat after the  addition of amlodipine. He's no longer having any PVCs however his EKG is abnormal today showing a new complete heart block. Heart rate is 44 with a sinus rhythm and wide QRS. There are inferior T-wave abnormalities which were previously seen on his EKG.  10/05/2015  [name] was seen today in follow-up of complete heart block. This was noted yesterday during his office visit. Heart rate was in the 40s and he was  somewhat diaphoretic. He denied any chest pain or shortness of breath but has felt fatigue recently. He had taken Toprol XL the night before and I advised him to not take any last evening and allow the medicine to wash out of his system. A today he is a heart rate has improved up to 88 and is in sinus rhythm with first-degree AV block and bifascicular block. He thinks he actually feels a little better today although was not diaphoretic. He said he didn't sleep well last night and had some diarrhea this morning. He does not noted change in energy level although he feels that he's had some recurrent palpitations.  01/01/2016  [name] returns today for follow-up. He underwent placement of a pacemaker which was uneventful for complete heart block. He says since that time his color is improved energy is better. Heart rate today is 61 however the pacer is set at a backup rate of 50. He was concerned that this may be still causing a little fatigue but I reassured him that I think that his heart rate is over that number and unlikely to be the cause. Blood pressure is well-controlled. The pacemaker site looks appropriate without any hematoma or effusion. He was recently seen by Almyra Deforest, PA-C , and was noted to have an elevated TSH. This is been elevated in the past but was as high as 9. Free T4 was ordered and those labs are pending. He did have radiation to his thyroid as part of treatment for Hodgkin's lymphoma.  04/18/2015  [name] was seen today in follow-up. Overall he seems to be doing very well. After discharge his amlodipine was increased to 10 mg from 5 mg. I rechecked his blood pressure today was 104/48, and he has noted that occasionally he gets a flushed feeling when exercising. I suspect he may be on too much medicine. We did discuss that amlodipine although does have a benefit for controlling blood pressure, has no intrinsic cardiovascular benefit. He does have normal renal function would probably  benefit from being on an ACE or ARB. He denies any chest pain or worsening shortness of breath. No problems with his pacemaker. He has been seeing an endocrinologist and his thyroid function has normalized on low-dose levothyroxine.  10/17/2016  Mr. Manship returns today for follow-up. He continues to do well. Recently he had his valsartan and switch to [place] losartan. He was feeling a little fatigue any decrease the dose from 20 mg to 10 mg daily. Blood pressure he said was running low for him. Today seems to be normal at 112/68. He is going to see his primary care provider about retesting of his testosterone. Apparently in the past this is been low as a result of chemotherapy and radiation. 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 He denies any chest pain. He's had no presyncope or syncopal symptoms. His pacemaker is been functioning appropriately on remote checks. This now almost one year since his two-vessel PCI.  04/24/2017  Mr. Kasel returns today for follow-up.  Overall he is doing well.  He denies any chest pain or worsening shortness of breath.  Blood pressure was elevated today however came down to 128/56.  He remains a heart rate of 50 which is his backup pacer setting.  He is in complete heart block and testing is indicated that he is pacemaker dependent.  He does do remote checks.  He had two-vessel coronary artery disease and has been asymptomatic with that.  He is on aspirin and high-dose atorvastatin.  He has a physical exam coming up in a few weeks with his primary care provider who will get cholesterol testing at that time.  He is also on olmesartan for blood pressure.  04/12/2018  Mr. Coopman is seen today in follow-up.  He had a very eventful last several months.  As above, he has a history of heart block with permanent pacemaker placement in 2017, CAD status post multivessel PCI in 2017, chronic combined  systolic and diastolic heart failure, status post bioprosthetic MVR and CABG x2 in December 2019, and new onset atrial fibrillation status post cardioversion on 03/26/2018.  He again presented recently with worsening shortness of breath and tachycardia and elective cardioversion was recommended.  In addition he was found to be in significant heart failure and was heavily diuresed.  Since discharge his weight has been down although he did gain back a few pounds.  Despite this, he has no evidence of any heart failure today.  In fact recently he has been getting lightheaded and dizzy and it was advised that he discontinue Lasix and potassium.  He is on Entresto.  His LVEF had decreased to 25% from the mid 40% range.  He is also on warfarin which was started initially for his mitral valve surgery, but has been continued because of A. fib.  There is a question of whether he has valvular A. Fib.  03/20/2019  Mr. Campion returns today for follow-up.  He reports feeling much better.  He underwent biventricular upgrade of his device in December and since then has been doing better with improved energy and less heart failure symptoms.  Blood pressure today is well controlled 130/71.  He reports his weight is stable.  We had discussed uptitrating his Delene Loll however when I suggested we may consider that today he did not want to change his medicines.  His INR has been therapeutic on warfarin.  We discussed whether he possibly could come off of this since his A. fib was post CABG.  He also is being monitored with his device.  I recommended deferring this decision to Dr.Allred who is the A. fib expert when he follows up with him in April.  In addition we discussed a follow-up echo which should occur prior to that visit.  PMHx:  Past Medical History:  Diagnosis Date  . Abnormal TSH   . Arthritis    ddd  . Atrial fibrillation and flutter ([place])    s/p DCCV 03/26/2018  . CAD in native artery    a. 11/2015: s/p 2 vessel PCI  of the first diagonal branch of the LAD in the mid left circumflex with DES. // s/p CABG 01/2018  . Chronic combined systolic and diastolic CHF (congestive heart failure) ([place])    Echo 10/19 (prior to MVR, CABG):  Mild LVH, anteroseptal and apical hypokinesis, EF 40, grade 2 diastolic dysfunction, MAC, moderate to severe MR, moderate to severe LAE, normal RV SF, PASP 39, trivial  effusion  . Complete heart block ([facility])    a. s/p Medtronic ppm 10/2015.  Marland Kitchen History of radiation therapy 2002  . Hodgkin disease ([place]) 2002   a. s/p chest radiation and chemotherapy  . Hypothyroidism   . Incidental pulmonary nodule 01/19/2018   Left apical opacity - possible scarring  . Mitral regurgitation    s/p bioprosthetic MV replacement 01/2018  . Pacemaker 10/2015   Medtronic  . PVC's (premature ventricular contractions)   . Right bundle branch block   . 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 S/P mitral valve replacement with bioprosthetic valve 01/27/2018   29 mm [facility] Mitral stented bovine pericardial tissue valve    Past Surgical History:  Procedure Laterality Date  . BIV UPGRADE N/A 02/08/2019   Procedure: UPGRADE TO BIV PPM;  Surgeon: [name], MD;  Location: [place] CV LAB;  Service: Cardiovascular;  Laterality: N/A;  . CARDIAC CATHETERIZATION N/A 11/02/2015   Procedure: Left Heart Cath and Coronary Angiography;  Surgeon: Sherren Mocha, MD;  Location: [place] CV LAB;  Service: Cardiovascular;  Laterality: N/A;  . CARDIAC CATHETERIZATION N/A 11/21/2015   Procedure: Coronary Stent Intervention;  Surgeon: Sherren Mocha, MD;  Location: [place] CV LAB;  Service: Cardiovascular;  Laterality: N/A;  . CARDIAC CATHETERIZATION  11/2017  . CARDIOVERSION N/A 03/26/2018   Procedure: CARDIOVERSION;  Surgeon: [name], MD;  Location: [facility] ENDOSCOPY;  Service: Cardiovascular;  Laterality: N/A;  . CORONARY ARTERY BYPASS GRAFT N/A 01/27/2018    Procedure: CORONARY ARTERY BYPASS GRAFTING (CABG) x two, using left internal mammary artery and right leg greater saphenous vein harvested endoscopically;  Surgeon: Rexene Alberts, MD;  Location: [place];  Service: Open Heart Surgery;  Laterality: N/A;  . EP IMPLANTABLE DEVICE N/A 11/02/2015   MDT Adivisa MRI conditional dual chamber pacemaker implanted by Dr Rayann Heman for complete heart block  . KNEE SURGERY Right 1972  . LUMBAR LAMINECTOMY  1996  . MITRAL VALVE REPLACEMENT N/A 01/27/2018   Procedure: MITRAL VALVE (MV) REPLACEMENT using Magna Mitral Ease Valve Size 29MM;  Surgeon: Rexene Alberts, MD;  Location: [place];  Service: Open Heart Surgery;  Laterality: N/A;  . RIGHT/LEFT HEART CATH AND CORONARY ANGIOGRAPHY N/A 12/02/2017   Procedure: RIGHT/LEFT HEART CATH AND CORONARY ANGIOGRAPHY;  Surgeon: Jettie Booze, MD;  Location: [place] CV LAB;  Service: Cardiovascular;  Laterality: N/A;  . TEE WITHOUT CARDIOVERSION N/A 12/09/2017   Procedure: TRANSESOPHAGEAL ECHOCARDIOGRAM (TEE);  Surgeon: Buford Dresser, MD;  Location: [facility] ENDOSCOPY;  Service: Cardiovascular;  Laterality: N/A;  . TEE WITHOUT CARDIOVERSION N/A 01/27/2018   Procedure: TRANSESOPHAGEAL ECHOCARDIOGRAM (TEE);  Surgeon: Rexene Alberts, MD;  Location: [place];  Service: Open Heart Surgery;  Laterality: N/A;    FAMHx:  Family History  Problem Relation Age of Onset  . Cancer Other        prostate father hx  . Heart disease Mother   . Heart disease Father   . Colon cancer Neg Hx     SOCHx:   reports that he has never smoked. He has never used smokeless tobacco. He reports current alcohol use of about 5.0 standard drinks of alcohol per week. He reports that he does not use drugs.  ALLERGIES:  Allergies  Allergen Reactions  . Other     Pt reported allergic to dust mites that causes of sneezing, runny nose    ROS: Pertinent items noted  in HPI and remainder of comprehensive ROS otherwise negative.  HOME  MEDS: Current Outpatient Medications  Medication Sig Dispense Refill  . aspirin EC 81 MG tablet Take 81 mg by mouth daily.    Marland Kitchen atorvastatin (LIPITOR) 80 MG tablet Take 1 tablet (80 mg total) by mouth daily at 6 PM. 90 tablet 2  . carvedilol (COREG) 6.25 MG tablet TAKE 1 TABLET BY MOUTH 2 TIMES DAILY WITH A MEAL 180 tablet 3  . ENTRESTO 24-26 MG TAKE 1 TABLET BY MOUTH TWICE DAILY 60 tablet 9  . levothyroxine (SYNTHROID) 75 MCG tablet Take 1 tablet (75 mcg total) by mouth daily before breakfast. 45 tablet 3  . Multiple Vitamin (MULTIVITAMIN WITH MINERALS) TABS tablet Take 1 tablet by mouth daily.    Marland Kitchen spironolactone (ALDACTONE) 25 MG tablet Take 0.5 tablets (12.5 mg total) by mouth daily. 45 tablet 3  . traZODone (DESYREL) 50 MG tablet Take 1 tablet (50 mg total) by mouth at bedtime. (Patient taking differently: Take 25-50 mg by mouth at bedtime. ) 90 tablet 3  . warfarin (COUMADIN) 5 MG tablet TAKE 1 TO 1 AND 1/2 TABLETS BY MOUTH DAILY AS DIRECTED BY COUMADIN CLINIC (Patient taking differently: Take 7.5-10 mg by mouth See admin instructions. TAKE 1 TO 1 AND 1/2 TABLETS BY MOUTH DAILY AS DIRECTED BY COUMADIN CLINIC On Saturday 60m, all other days 7.5 mg) 135 tablet 0   No current facility-administered medications for this visit.    LABS/IMAGING: Results for orders placed or performed in visit on 03/18/19 (from the past 48 hour(s))  POCT INR     Status: None   Collection Time: 03/18/19  3:38 PM  Result Value Ref Range   INR 2.2 2.0 - 3.0   No results found.  WEIGHTS: Wt Readings from Last 3 Encounters:  03/18/19 187 lb 9.6 oz (85.1 kg)  02/08/19 184 lb (83.5 kg)  09/29/18 187 lb (84.8 kg)    VITALS: BP 130/71   Pulse (!) 50   Ht _0  (1.803 m)   Wt 187 lb 9.6 oz (85.1 kg)   SpO2 98%   BMI 26.16 kg/m   EXAM: General appearance: alert and no distress Neck: no carotid bruit, no JVD and thyroid not enlarged, symmetric, no tenderness/mass/nodules Lungs: clear to auscultation  bilaterally Heart: regular rate and rhythm, S1, S2 normal, no murmur, click, rub or gallop Abdomen: soft, non-tender; bowel sounds normal; no masses,  no organomegaly Extremities: extremities normal, atraumatic, no cyanosis or edema Pulses: 2+ and symmetric Skin: Skin color, texture, turgor normal. No rashes or lesions Neurologic: Grossly normal Psych: Pleasant  EKG: Biventricular paced rhythm at 50-personally reviewed  ASSESSMENT: 1. CAD - s/p PCI to the D1 and mid LCX with DES (11/2015) 2. Status post CABG x2 (LIMA to LAD, SVG to RCA) and bioprosthetic MVR -29 mm Edwards magna bovine bioprosthesis (01/2018) 3. Complete heart block-s/p Medtronic pacemaker (10/2015) with CRT-P upgrade  (01/2019) 4. Acute on chronic systolic congestive heart failure with EF as low as 25% (2019) 5. Palpitations - PVCs including trigeminy and quadrigeminy, generally improved on b-blocker 6. Abnormal EKG with bifascicular block and competing junctional bradycardia 7. History of chest wall radiation for Hodgkin's lymphoma 8. Essential hypertension 9. Hypothyroidism 10. Paroxysmal atrial fibrillation on warfarin  PLAN: 1.   Mr. TGuandiqueis symptomatically much better specifically after CRT-P upgrade in December 2020.  At most now he has New York Heart Association class II symptoms but generally is class I.  I propose  uptitrating his Delene Loll but he does not want to switch the medicines at this time.  We will check a repeat echo likely in March to see if there is been any LV functional improvement and plan for follow-up with Dr. Rayann Heman in April.  At that time they can discuss whether is possible for him to come off of warfarin.  Follow-up with me in 6 months.  Pixie Casino, MD, [facility], [place] Director of the [facility]  [facility] Diplomate of the American Board of Clinical Lipidology Attending Cardiologist  Direct Dial:  [phone]  Fax: [phone]  Website:  [email or web address] 03/18/2019, 3:48 PM

## 2019-03-20 ENCOUNTER — Encounter: Payer: Self-pay | Admitting: Internal Medicine

## 2019-03-21 ENCOUNTER — Other Ambulatory Visit: Payer: Self-pay

## 2019-03-21 ENCOUNTER — Encounter: Payer: Self-pay | Admitting: Family Medicine

## 2019-03-21 ENCOUNTER — Ambulatory Visit (INDEPENDENT_AMBULATORY_CARE_PROVIDER_SITE_OTHER): Payer: 59 | Admitting: Family Medicine

## 2019-03-21 VITALS — BP 120/64 | HR 54 | Temp 97.6°F | Ht 71.0 in | Wt 190.8 lb

## 2019-03-21 DIAGNOSIS — Z0000 Encounter for general adult medical examination without abnormal findings: Secondary | ICD-10-CM

## 2019-03-21 DIAGNOSIS — E039 Hypothyroidism, unspecified: Secondary | ICD-10-CM

## 2019-03-21 DIAGNOSIS — Z23 Encounter for immunization: Secondary | ICD-10-CM

## 2019-03-21 MED ORDER — TRAZODONE HCL 50 MG PO TABS: 50.0000 mg | ORAL_TABLET | Freq: Every day | ORAL | 3 refills | Status: DC

## 2019-03-21 MED ORDER — SILDENAFIL CITRATE 100 MG PO TABS: 100.0000 mg | ORAL_TABLET | Freq: Every day | ORAL | 3 refills | Status: DC | PRN

## 2019-03-21 MED FILL — SILDENAFIL CITRATE 100 MG T: 100 | 30 days supply | Qty: 30 | Fill #0

## 2019-03-21 MED FILL — traZODone HCL 50 MG TABS: 50 | 90 days supply | Qty: 90 | Fill #0

## 2019-03-21 NOTE — Progress Notes (Signed)
Subjective:    Patient ID: Tyler Hendrix, male    DOB: 08-02-1955, 64 y.o.   MRN: [id number]  HPI Here to establish with me and for a well exam. He feels well in general and has no complaints. He has a hx of CAD and is S/P a CABG with stents in place. He has combined systolic and diastolic heart failure, and his last ECHO last June showed an EF of 30-35%. He is S/P a biosynthetic mitral valve replacement, and he has paroxysmal atrial fibrillation. He had a cardioversion in February of 2020, and he has been in sinus rhythm since then except for some minor ectopy. He does have a pacemaker in place. He denies any chest pain or SOB or palpitations. He remains on Coumadin however and he is not sure when this can be stopped. He sees Dr. Johney Hendrix for cardiology care, and he will see him again on April 9. Tyler Hendrix is active and he runs a mile every day. He works as an Theme park manager. He does mention some erection difficulties this past year, though his libido has not diminished.    Review of Systems  Constitutional: Negative.   HENT: Negative.   Eyes: Negative.   Respiratory: Negative.   Cardiovascular: Negative.   Gastrointestinal: Negative.   Genitourinary: Negative.   Musculoskeletal: Negative.   Skin: Negative.   Neurological: Negative.   Psychiatric/Behavioral: Negative.        Objective:   Physical Exam Constitutional:      General: He is not in acute distress.    Appearance: He is well-developed. He is not diaphoretic.  HENT:     Head: Normocephalic and atraumatic.     Right Ear: External ear normal.     Left Ear: External ear normal.     Nose: Nose normal.     Mouth/Throat:     Pharynx: No oropharyngeal exudate.  Eyes:     General: No scleral icterus.       Right eye: No discharge.        Left eye: No discharge.     Conjunctiva/sclera: Conjunctivae normal.     Pupils: Pupils are equal, round, and reactive to light.  Neck:     Thyroid: No thyromegaly.   Vascular: No JVD.     Trachea: No tracheal deviation.  Cardiovascular:     Rate and Rhythm: Normal rate and regular rhythm.     Heart sounds: Normal heart sounds. No murmur. No friction rub. No gallop.   Pulmonary:     Effort: Pulmonary effort is normal. No respiratory distress.     Breath sounds: Normal breath sounds. No wheezing or rales.  Chest:     Chest wall: No tenderness.  Abdominal:     General: Bowel sounds are normal. There is no distension.     Palpations: Abdomen is soft. There is no mass.     Tenderness: There is no abdominal tenderness. There is no guarding or rebound.  Genitourinary:    Penis: Normal. No tenderness.      Testes: Normal.     Prostate: Normal.     Rectum: Normal. Guaiac result negative.  Musculoskeletal:        General: No tenderness. Normal range of motion.     Cervical back: Neck supple.  Lymphadenopathy:     Cervical: No cervical adenopathy.  Skin:    General: Skin is warm and dry.     Coloration: Skin is not pale.     Findings:  No erythema or rash.  Neurological:     Mental Status: He is alert and oriented to person, place, and time.     Cranial Nerves: No cranial nerve deficit.     Motor: No abnormal muscle tone.     Coordination: Coordination normal.     Deep Tendon Reflexes: Reflexes are normal and symmetric. Reflexes normal.  Psychiatric:        Behavior: Behavior normal.        Thought Content: Thought content normal.        Judgment: Judgment normal.           Assessment & Plan:  Well exam. We discussed diet and exercise. Get fasting labs soon. He will follow up with Dr. Johney Hendrix as above and with Dr. Elvera Hendrix for his hypothyroidism. Try Sildenafil 100 mg for the ED.  Tyler Crane, MD

## 2019-03-23 ENCOUNTER — Other Ambulatory Visit: Payer: Self-pay | Admitting: Internal Medicine

## 2019-03-23 MED FILL — WARFARIN SODIUM 5 MG TABLET: 5 | 90 days supply | Qty: 135 | Fill #0

## 2019-03-29 ENCOUNTER — Other Ambulatory Visit: Payer: Self-pay

## 2019-03-30 ENCOUNTER — Other Ambulatory Visit: Payer: Self-pay

## 2019-03-30 ENCOUNTER — Other Ambulatory Visit (INDEPENDENT_AMBULATORY_CARE_PROVIDER_SITE_OTHER): Payer: 59

## 2019-03-30 DIAGNOSIS — E039 Hypothyroidism, unspecified: Secondary | ICD-10-CM | POA: Diagnosis not present

## 2019-03-30 DIAGNOSIS — Z0000 Encounter for general adult medical examination without abnormal findings: Secondary | ICD-10-CM | POA: Diagnosis not present

## 2019-03-30 LAB — HEPATIC FUNCTION PANEL
ALT: 30 U/L (ref 0–53)
AST: 28 U/L (ref 0–37)
Albumin: 4.4 g/dL (ref 3.5–5.2)
Alkaline Phosphatase: 57 U/L (ref 39–117)
Bilirubin, Direct: 0.2 mg/dL (ref 0.0–0.3)
Total Bilirubin: 1.1 mg/dL (ref 0.2–1.2)
Total Protein: 6.6 g/dL (ref 6.0–8.3)

## 2019-03-30 LAB — PSA: PSA: 2.77 ng/mL (ref 0.10–4.00)

## 2019-03-30 LAB — BASIC METABOLIC PANEL
BUN: 15 mg/dL (ref 6–23)
CO2: 30 mEq/L (ref 19–32)
Calcium: 9.3 mg/dL (ref 8.4–10.5)
Chloride: 104 mEq/L (ref 96–112)
Creatinine, Ser: 1.04 mg/dL (ref 0.40–1.50)
GFR: 72.15 mL/min (ref >60.00)
Glucose, Bld: 103 mg/dL — ABNORMAL HIGH (ref 70–99)
Potassium: 4.7 mEq/L (ref 3.5–5.1)
Sodium: 139 mEq/L (ref 135–145)

## 2019-03-30 LAB — CBC WITH DIFFERENTIAL/PLATELET
Basophils Absolute: 0 10*3/uL (ref 0.0–0.1)
Basophils Relative: 0.6 % (ref 0.0–3.0)
Eosinophils Absolute: 0.3 10*3/uL (ref 0.0–0.7)
Eosinophils Relative: 3.7 % (ref 0.0–5.0)
HCT: 41.3 % (ref 39.0–52.0)
Hemoglobin: 14 g/dL (ref 13.0–17.0)
Lymphocytes Relative: 14.6 % (ref 12.0–46.0)
Lymphs Abs: 1.1 10*3/uL (ref 0.7–4.0)
MCHC: 34 g/dL (ref 30.0–36.0)
MCV: 95.2 fl (ref 78.0–100.0)
Monocytes Absolute: 0.5 10*3/uL (ref 0.1–1.0)
Monocytes Relative: 7.2 % (ref 3.0–12.0)
Neutro Abs: 5.4 10*3/uL (ref 1.4–7.7)
Neutrophils Relative %: 73.9 % (ref 43.0–77.0)
Platelets: 241 10*3/uL (ref 150.0–400.0)
RBC: 4.34 Mil/uL (ref 4.22–5.81)
RDW: 13.5 % (ref 11.5–15.5)
WBC: 7.3 10*3/uL (ref 4.0–10.5)

## 2019-03-30 LAB — T4, FREE: Free T4: 0.92 ng/dL (ref 0.60–1.60)

## 2019-03-30 LAB — LIPID PANEL
Cholesterol: 105 mg/dL (ref 0–200)
HDL: 33.2 mg/dL — ABNORMAL LOW (ref >39.00)
LDL Cholesterol: 50 mg/dL (ref 0–99)
NonHDL: 71.31
Total CHOL/HDL Ratio: 3
Triglycerides: 109 mg/dL (ref 0.0–149.0)
VLDL: 21.8 mg/dL (ref 0.0–40.0)

## 2019-03-30 LAB — T3, FREE: T3, Free: 3.6 pg/mL (ref 2.3–4.2)

## 2019-03-30 LAB — TSH: TSH: 4.17 u[IU]/mL (ref 0.35–4.50)

## 2019-03-30 NOTE — Progress Notes (Signed)
Warfarin discontinued by Dr. Allred/ Dr. Debara Pickett.

## 2019-04-06 MED FILL — CARVEDILOL 6.25 MG TABLET: 6.25 | 90 days supply | Qty: 180 | Fill #0

## 2019-04-06 MED FILL — LEVOTHYROXINE SODIUM 75 MCG: 75 | 30 days supply | Qty: 30 | Fill #3

## 2019-04-06 MED FILL — ENTRESTO 24 MG-26 MG TABLET: 24-26 | 90 days supply | Qty: 180 | Fill #0

## 2019-04-16 ENCOUNTER — Ambulatory Visit: Payer: 59 | Attending: Internal Medicine

## 2019-04-16 DIAGNOSIS — Z23 Encounter for immunization: Secondary | ICD-10-CM | POA: Insufficient documentation

## 2019-04-16 NOTE — Progress Notes (Signed)
Covid-19 Vaccination Clinic  Name:  Tyler Hendrix    MRN: [id number] DOB: 08-Oct-1955  04/16/2019  [name] was observed post Covid-19 immunization for 15 minutes without incident. He was provided with Vaccine Information Sheet and instruction to access the V-Safe system.   [name] was instructed to call 911 with any severe reactions post vaccine: Marland Kitchen Difficulty breathing  . Swelling of face and throat  . A fast heartbeat  . A bad rash all over body  . Dizziness and weakness   Immunizations Administered    Name Date Dose VIS Date Route   Moderna COVID-19 Vaccine 04/16/2019  9:37 AM 0.5 mL 01/11/2019 Intramuscular   Manufacturer: Moderna   Lot: [id number]   NDC: 13086-578-46

## 2019-05-12 ENCOUNTER — Ambulatory Visit (INDEPENDENT_AMBULATORY_CARE_PROVIDER_SITE_OTHER): Payer: 59 | Admitting: *Deleted

## 2019-05-12 DIAGNOSIS — R001 Bradycardia, unspecified: Secondary | ICD-10-CM

## 2019-05-12 LAB — CUP PACEART REMOTE DEVICE CHECK
Battery Remaining Longevity: 100 mo
Battery Voltage: 3.1 V
Brady Statistic AP VP Percent: 58.54 %
Brady Statistic AP VS Percent: 0.03 %
Brady Statistic AS VP Percent: 40.8 %
Brady Statistic AS VS Percent: 0.63 %
Brady Statistic RA Percent Paced: 58.94 %
Brady Statistic RV Percent Paced: 99.34 %
Date Time Interrogation Session: 20210401012256
Implantable Lead Implant Date: 20170922
Implantable Lead Implant Date: 20170922
Implantable Lead Implant Date: 20201229
Implantable Lead Location: 753858
Implantable Lead Location: 753859
Implantable Lead Location: 753860
Implantable Lead Model: 4598
Implantable Lead Model: 5076
Implantable Lead Model: 5076
Implantable Pulse Generator Implant Date: 20201229
Lead Channel Impedance Value: 1292 Ohm
Lead Channel Impedance Value: 304 Ohm
Lead Channel Impedance Value: 361 Ohm
Lead Channel Impedance Value: 418 Ohm
Lead Channel Impedance Value: 437 Ohm
Lead Channel Impedance Value: 456 Ohm
Lead Channel Impedance Value: 494 Ohm
Lead Channel Impedance Value: 513 Ohm
Lead Channel Impedance Value: 513 Ohm
Lead Channel Impedance Value: 703 Ohm
Lead Channel Impedance Value: 741 Ohm
Lead Channel Impedance Value: 798 Ohm
Lead Channel Impedance Value: 817 Ohm
Lead Channel Impedance Value: 855 Ohm
Lead Channel Pacing Threshold Amplitude: 0.875 V
Lead Channel Pacing Threshold Amplitude: 1.625 V
Lead Channel Pacing Threshold Amplitude: 1.875 V
Lead Channel Pacing Threshold Pulse Width: 0.4 ms
Lead Channel Pacing Threshold Pulse Width: 0.4 ms
Lead Channel Pacing Threshold Pulse Width: 1 ms
Lead Channel Sensing Intrinsic Amplitude: 15.875 mV
Lead Channel Sensing Intrinsic Amplitude: 15.875 mV
Lead Channel Sensing Intrinsic Amplitude: 3.125 mV
Lead Channel Sensing Intrinsic Amplitude: 3.125 mV
Lead Channel Setting Pacing Amplitude: 2.5 V
Lead Channel Setting Pacing Amplitude: 2.5 V
Lead Channel Setting Pacing Amplitude: 3.25 V
Lead Channel Setting Pacing Pulse Width: 0.4 ms
Lead Channel Setting Pacing Pulse Width: 1 ms
Lead Channel Setting Sensing Sensitivity: 4 mV

## 2019-05-13 ENCOUNTER — Ambulatory Visit (HOSPITAL_COMMUNITY): Payer: 59 | Attending: Internal Medicine

## 2019-05-13 ENCOUNTER — Other Ambulatory Visit: Payer: Self-pay

## 2019-05-13 ENCOUNTER — Other Ambulatory Visit: Payer: Self-pay | Admitting: Internal Medicine

## 2019-05-13 DIAGNOSIS — I255 Ischemic cardiomyopathy: Secondary | ICD-10-CM | POA: Diagnosis not present

## 2019-05-13 DIAGNOSIS — Z952 Presence of prosthetic heart valve: Secondary | ICD-10-CM | POA: Diagnosis not present

## 2019-05-13 DIAGNOSIS — Z951 Presence of aortocoronary bypass graft: Secondary | ICD-10-CM | POA: Diagnosis not present

## 2019-05-13 MED FILL — SPIRONOLACTONE 25 MG TABS: 25 | 90 days supply | Qty: 45 | Fill #0

## 2019-05-13 NOTE — Progress Notes (Signed)
PPM Remote  

## 2019-05-17 ENCOUNTER — Other Ambulatory Visit: Payer: Self-pay

## 2019-05-17 NOTE — Progress Notes (Signed)
Patient ID: Tyler Hendrix, male   DOB: April 17, 1955, 64 y.o.   MRN: [id number]   This visit occurred during the SARS-CoV-2 public health emergency.  Safety protocols were in place, including screening questions prior to the visit, additional usage of staff PPE, and extensive cleaning of exam room while observing appropriate contact time as indicated for disinfecting solutions.   HPI  Tyler Hendrix is a 64 y.o.-year-old male, returning for follow-up for hypothyroidism.  Last visit 1 year ago.  Before last visit, he had multiple admissions for MV replacement, CABG, then admitted with acute on chronic CHF on 03/27/2018. EF 25-30%.  He also has A. fib and had cardioversions in the past.  Now he has a biventricular pacemaker. New 2D Echo: 40%.  Reviewed history: Pt. has been dx with hypothyroidism in ~2010. He has been found to have a high TSH during investigation for AVB.   He felt better after starting levothyroxine.  Reviewed his TFTs: Lab Results  Component Value Date   TSH 4.17 03/30/2019   TSH 4.630 (H) 03/27/2018   TSH 2.50 05/12/2017   TSH 4.10 09/11/2016   TSH 5.55 (H) 07/03/2016   TSH 5.55 (H) 04/16/2016   TSH 9.38 (H) 01/01/2016   TSH 9.07 (H) 10/29/2015   TSH 4.76 (H) 04/20/2014   TSH 4.82 10/17/2011   FREET4 0.92 03/30/2019   FREET4 0.96 05/12/2017   FREET4 1.14 09/11/2016   FREET4 0.79 07/03/2016   FREET4 0.87 04/16/2016   FREET4 1.0 01/01/2016    Pt is on levothyroxine 75 mcg daily, taken: - in am (3-5 am) with low dose Trazodone - fasting - at least 30 min from b'fast - no Ca, Fe, PPIs - + MVI and herbal+ mineral suplements in pm - not on Biotin  Pt denies: - feeling nodules in neck - hoarseness - dysphagia - choking - SOB with lying down  Of note, he does have a history of radiation treatment in the head and neck area in 2002.  No FH of thyroid disease or thyroid cancer. No h/o radiation tx to head or neck.  No seaweed or kelp. No recent contrast  studies. + herbal + mineral supplements. No Biotin use. No recent steroids use.   Pt. 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 He had RxTx and ChTx in 2002 for Hodgkin Lymphoma.  ROS: Constitutional: no weight gain/no weight loss, no fatigue, no subjective hyperthermia, no subjective hypothermia Eyes: no blurry vision, no xerophthalmia ENT: no sore throat, + see HPI Cardiovascular: no CP/no SOB/+ very rarely palpitations/no leg swelling Respiratory: no cough/no SOB/no wheezing Gastrointestinal: no N/no V/no D/no C/no acid reflux Musculoskeletal: no muscle aches/no joint aches Skin: no rashes, no hair loss Neurological: no tremors/no numbness/no tingling/no dizziness  I reviewed pt's medications, allergies, PMH, social hx, family hx, and changes were documented in the history of present illness. Otherwise, unchanged from my initial visit note.  Past Medical History:  Diagnosis Date  . Abnormal TSH   . Arthritis    ddd  . Atrial fibrillation and flutter (HCC)    s/p DCCV 03/26/2018  . CAD in native artery    a. 11/2015: s/p 2 vessel PCI of the first diagonal branch of the LAD in the mid left circumflex with DES. // s/p CABG 01/2018  . Chronic combined systolic and diastolic CHF (congestive heart failure) (HCC)    sees Dr. Johney Frame   . Complete heart block (HCC)  a. s/p Medtronic ppm 10/2015.  Marland Kitchen History of radiation therapy 2002  . Hodgkin disease (HCC) 2002   a. s/p chest radiation and chemotherapy  . Hypothyroidism    sees Dr. Elvera Lennox   . Incidental pulmonary nodule 01/19/2018   Left apical opacity - possible scarring  . Mitral regurgitation    s/p bioprosthetic MV replacement 01/2018  . Pacemaker 10/2015   Medtronic  . PVC's (premature ventricular contractions)   . Right bundle branch block   . 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 S/P mitral valve replacement with bioprosthetic valve 01/27/2018   29 mm  [facility] Mitral stented bovine pericardial tissue valve   Past Surgical History:  Procedure Laterality Date  . BIV UPGRADE N/A 02/08/2019   Procedure: UPGRADE TO BIV PPM;  Surgeon: Hillis Range, MD;  Location: [facility] INVASIVE CV LAB;  Service: Cardiovascular;  Laterality: N/A;  . CARDIAC CATHETERIZATION N/A 11/02/2015   Procedure: Left Heart Cath and Coronary Angiography;  Surgeon: Tonny Bollman, MD;  Location: [facility] INVASIVE CV LAB;  Service: Cardiovascular;  Laterality: N/A;  . CARDIAC CATHETERIZATION N/A 11/21/2015   Procedure: Coronary Stent Intervention;  Surgeon: Tonny Bollman, MD;  Location: [facility] INVASIVE CV LAB;  Service: Cardiovascular;  Laterality: N/A;  . CARDIAC CATHETERIZATION  11/2017  . CARDIOVERSION N/A 03/26/2018   Procedure: CARDIOVERSION;  Surgeon: Parke Poisson, MD;  Location: [facility] ENDOSCOPY;  Service: Cardiovascular;  Laterality: N/A;  . COLONOSCOPY  01/16/2015   per Dr. Christella Hartigan, benign polyps, repeat in 10 yrs   . CORONARY ARTERY BYPASS GRAFT N/A 01/27/2018   Procedure: CORONARY ARTERY BYPASS GRAFTING (CABG) x two, using left internal mammary artery and right leg greater saphenous vein harvested endoscopically;  Surgeon: Purcell Nails, MD;  Location: [facility] OR;  Service: Open Heart Surgery;  Laterality: N/A;  . EP IMPLANTABLE DEVICE N/A 11/02/2015   MDT Adivisa MRI conditional dual chamber pacemaker implanted by Dr Johney Frame for complete heart block  . KNEE SURGERY Right 1972  . LUMBAR LAMINECTOMY  1996  . MITRAL VALVE REPLACEMENT N/A 01/27/2018   Procedure: MITRAL VALVE (MV) REPLACEMENT using Magna Mitral Ease Valve Size ;  Surgeon: Purcell Nails, MD;  Location: [facility] OR;  Service: Open Heart Surgery;  Laterality: N/A;  . RIGHT/LEFT HEART CATH AND CORONARY ANGIOGRAPHY N/A 12/02/2017   Procedure: RIGHT/LEFT HEART CATH AND CORONARY ANGIOGRAPHY;  Surgeon: Corky Crafts, MD;  Location: [facility] INVASIVE CV LAB;  Service: Cardiovascular;  Laterality: N/A;  . TEE WITHOUT  CARDIOVERSION N/A 12/09/2017   Procedure: TRANSESOPHAGEAL ECHOCARDIOGRAM (TEE);  Surgeon: Jodelle Red, MD;  Location: [facility] ENDOSCOPY;  Service: Cardiovascular;  Laterality: N/A;  . TEE WITHOUT CARDIOVERSION N/A 01/27/2018   Procedure: TRANSESOPHAGEAL ECHOCARDIOGRAM (TEE);  Surgeon: Purcell Nails, MD;  Location: [facility] OR;  Service: Open Heart Surgery;  Laterality: N/A;   Social History   Socioeconomic History  . Marital status: Married    Spouse name: Not on file  . Number of children: 2  . Years of education: Masters  . Highest education level: Not on file  Occupational History  . Occupation: Clinical research associate    Comment: [employer]  Tobacco Use  . Smoking status: Never Smoker  . Smokeless tobacco: Never Used  Substance and Sexual Activity  . Alcohol use: Yes    Alcohol/week: 5.0 standard drinks    Types: 5 Glasses of wine per week  . Drug use: No  .  Sexual activity: Not on file  Other Topics Concern  . 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 Difficulty of Paying Living Expenses:   Food Insecurity:   . Worried About Programme researcher, broadcasting/film/video in the Last Year:   . Barista in the Last Year:   Transportation Needs:   . Freight forwarder (Medical):   Marland Kitchen Lack of Transportation (Non-Medical):   Physical Activity:   . Days of Exercise per Week:   . Minutes of Exercise per Session:   Stress:   . Feeling of Stress :   Social Connections:   . Frequency of Communication with Friends and Family:   . Frequency of Social Gatherings with Friends and Family:   . Attends Religious Services:   . Active Member of Clubs or Organizations:   . Attends Banker Meetings:   Marland Kitchen Marital Status:   Intimate Partner Violence:   . Fear of Current or Ex-Partner:   . Emotionally Abused:   Marland Kitchen Physically Abused:   . Sexually Abused:    Current Outpatient  Medications on File Prior to Visit  Medication Sig Dispense Refill  . aspirin EC 81 MG tablet Take 81 mg by mouth daily.    Marland Kitchen atorvastatin (LIPITOR) 80 MG tablet Take 1 tablet (80 mg total) by mouth daily at 6 PM. 90 tablet 2  . carvedilol (COREG) 6.25 MG tablet TAKE 1 TABLET BY MOUTH 2 TIMES DAILY WITH A MEAL 180 tablet 3  . levothyroxine (SYNTHROID) 75 MCG tablet Take 1 tablet (75 mcg total) by mouth daily before breakfast. 45 tablet 3  . Multiple Vitamin (MULTIVITAMIN WITH MINERALS) TABS tablet Take 1 tablet by mouth daily.    . sacubitril-valsartan (ENTRESTO) 24-26 MG Take 1 tablet by mouth 2 (two) times daily. 180 tablet 3  . sildenafil (VIAGRA) 100 MG tablet Take 1 tablet (100 mg total) by mouth daily as needed for erectile dysfunction. 30 tablet 3  . spironolactone (ALDACTONE) 25 MG tablet Take 0.5 tablets (12.5 mg total) by mouth daily. 45 tablet 3  . traZODone (DESYREL) 50 MG tablet Take 1 tablet (50 mg total) by mouth at bedtime. 90 tablet 3   No current facility-administered medications on file prior to visit.   Allergies  Allergen Reactions  . Other     Pt reported allergic to dust mites that causes of sneezing, runny nose   Family History  Problem Relation Age of Onset  . Cancer Other        prostate father hx  . Heart disease Mother   . Heart disease Father   . Colon cancer Neg Hx     PE: BP 122/68 (BP Location: Left Arm, Patient Position: Sitting, Cuff Size: Normal)   Pulse (!) 52   Ht 5\' 11"  (1.803 m)   Wt 189 lb 3.2 oz (85.8 kg)   SpO2 98%   BMI 26.39 kg/m  Wt Readings from Last 3 Encounters:  05/18/19 189 lb 3.2 oz (85.8 kg)  03/21/19 190 lb 12.8 oz (86.5 kg)  03/18/19 187 lb 9.6 oz (85.1 kg)   Constitutional: overweight, in NAD Eyes: PERRLA, EOMI, no exophthalmos ENT: moist mucous membranes, no thyromegaly, no cervical lymphadenopathy Cardiovascular: RRR, No MRG Respiratory: CTA B Gastrointestinal: abdomen soft, NT, ND, BS+ Musculoskeletal: no  deformities, strength intact in all 4 Skin: moist, warm, no rashes Neurological:  no tremor with outstretched hands, DTR normal in all 4  ASSESSMENT: 1. Subclinical Hypothyroidism - He does not appear to have a goiter, thyroid nodules, or neck compression symptoms  PLAN:  1. Patient with longstanding hypothyroidism, on levothyroxine -I saw the patient last year ago, and at that time, he returned after an eventful year, with multiple cardiac problems.  He was having significant fatigue.  Since then, he had a biventricular pacemaker placed. His EF improved recently. - latest thyroid labs reviewed with pt >> normal: Lab Results  Component Value Date   TSH 4.17 03/30/2019   - he continues on LT4 75 mcg daily - pt feels good on this dose. - we discussed about taking the thyroid hormone every day, with water, >30 minutes before breakfast, separated by >4 hours from acid reflux medications, calcium, iron, multivitamins. Pt. is taking it correctly.  At last visit, he was taking levothyroxine after breakfast, along with a multivitamin.  I advised him to move LT4 30 minutes before breakfast and the multivitamin at least 4 hours later - will check thyroid tests today: TSH and fT4, since labs checked 1.5 months ago showed a TSH at the upper limit of normal - If labs are abnormal, he will need to return for repeat TFTs in 1.5 months - OTW, I will see him back in 1 year  Needs refills.   Component     Latest Ref Rng & Units 05/18/2019  TSH     0.35 - 4.50 uIU/mL 2.84  T4,Free(Direct)     0.60 - 1.60 ng/dL 0.45  Normal labs.  Carlus Pavlov, MD PhD [facility]

## 2019-05-18 ENCOUNTER — Ambulatory Visit (INDEPENDENT_AMBULATORY_CARE_PROVIDER_SITE_OTHER): Payer: 59 | Admitting: Internal Medicine

## 2019-05-18 ENCOUNTER — Encounter: Payer: Self-pay | Admitting: Internal Medicine

## 2019-05-18 ENCOUNTER — Ambulatory Visit: Payer: 59 | Attending: Internal Medicine

## 2019-05-18 ENCOUNTER — Other Ambulatory Visit: Payer: Self-pay | Admitting: Internal Medicine

## 2019-05-18 VITALS — BP 122/68 | HR 52 | Ht 71.0 in | Wt 189.2 lb

## 2019-05-18 DIAGNOSIS — E039 Hypothyroidism, unspecified: Secondary | ICD-10-CM

## 2019-05-18 DIAGNOSIS — Z23 Encounter for immunization: Secondary | ICD-10-CM

## 2019-05-18 LAB — T4, FREE: Free T4: 0.86 ng/dL (ref 0.60–1.60)

## 2019-05-18 LAB — TSH: TSH: 2.84 u[IU]/mL (ref 0.35–4.50)

## 2019-05-18 MED ORDER — LEVOTHYROXINE SODIUM 75 MCG PO TABS: 75.0000 ug | ORAL_TABLET | Freq: Every day | ORAL | 3 refills | Status: DC

## 2019-05-18 MED FILL — LEVOTHYROXINE SODIUM 75 MCG: 75 | 90 days supply | Qty: 90 | Fill #0

## 2019-05-18 NOTE — Patient Instructions (Signed)
Please continue Levothyroxine 75 mcg daily.  Take the thyroid hormone every day, with water, at least 30 minutes before breakfast, separated by at least 4 hours from: - acid reflux medications - calcium - iron - multivitamins  Please stop at the lab.  Please come back for a follow-up appointment in 1 year. 

## 2019-05-18 NOTE — Progress Notes (Signed)
Covid-19 Vaccination Clinic  Name:  Tyler Hendrix    MRN: [id number] DOB: 1955/02/27  05/18/2019  Tyler Hendrix was observed post Covid-19 immunization for 15 minutes without incident. He was provided with Vaccine Information Sheet and instruction to access the V-Safe system.   Tyler Hendrix was instructed to call 911 with any severe reactions post vaccine: Marland Kitchen Difficulty breathing  . Swelling of face and throat  . A fast heartbeat  . A bad rash all over body  . Dizziness and weakness   Immunizations Administered    Name Date Dose VIS Date Route   Moderna COVID-19 Vaccine 05/18/2019  3:45 PM 0.5 mL 01/11/2019 Intramuscular   Manufacturer: Gala Murdoch   Lot: [id number]   NDC: 03474-259-56

## 2019-05-20 ENCOUNTER — Ambulatory Visit (INDEPENDENT_AMBULATORY_CARE_PROVIDER_SITE_OTHER): Payer: 59 | Admitting: Internal Medicine

## 2019-05-20 ENCOUNTER — Other Ambulatory Visit: Payer: Self-pay

## 2019-05-20 ENCOUNTER — Encounter: Payer: Self-pay | Admitting: Internal Medicine

## 2019-05-20 VITALS — BP 110/64 | HR 50 | Ht 71.0 in | Wt 190.0 lb

## 2019-05-20 DIAGNOSIS — I442 Atrioventricular block, complete: Secondary | ICD-10-CM

## 2019-05-20 DIAGNOSIS — I428 Other cardiomyopathies: Secondary | ICD-10-CM | POA: Diagnosis not present

## 2019-05-20 DIAGNOSIS — I480 Paroxysmal atrial fibrillation: Secondary | ICD-10-CM | POA: Diagnosis not present

## 2019-05-20 DIAGNOSIS — Z950 Presence of cardiac pacemaker: Secondary | ICD-10-CM | POA: Diagnosis not present

## 2019-05-20 DIAGNOSIS — I2510 Atherosclerotic heart disease of native coronary artery without angina pectoris: Secondary | ICD-10-CM

## 2019-05-20 LAB — CUP PACEART INCLINIC DEVICE CHECK
Battery Remaining Longevity: 104 mo
Battery Voltage: 3.09 V
Brady Statistic AP VP Percent: 59.22 %
Brady Statistic AP VS Percent: 0.03 %
Brady Statistic AS VP Percent: 40.17 %
Brady Statistic AS VS Percent: 0.58 %
Brady Statistic RA Percent Paced: 59.59 %
Brady Statistic RV Percent Paced: 99.39 %
Date Time Interrogation Session: 20210409090700
Implantable Lead Implant Date: 20170922
Implantable Lead Implant Date: 20170922
Implantable Lead Implant Date: 20201229
Implantable Lead Location: 753858
Implantable Lead Location: 753859
Implantable Lead Location: 753860
Implantable Lead Model: 4598
Implantable Lead Model: 5076
Implantable Lead Model: 5076
Implantable Pulse Generator Implant Date: 20201229
Lead Channel Impedance Value: 1368 Ohm
Lead Channel Impedance Value: 323 Ohm
Lead Channel Impedance Value: 380 Ohm
Lead Channel Impedance Value: 437 Ohm
Lead Channel Impedance Value: 456 Ohm
Lead Channel Impedance Value: 456 Ohm
Lead Channel Impedance Value: 513 Ohm
Lead Channel Impedance Value: 532 Ohm
Lead Channel Impedance Value: 532 Ohm
Lead Channel Impedance Value: 779 Ohm
Lead Channel Impedance Value: 779 Ohm
Lead Channel Impedance Value: 836 Ohm
Lead Channel Impedance Value: 855 Ohm
Lead Channel Impedance Value: 912 Ohm
Lead Channel Pacing Threshold Amplitude: 0.875 V
Lead Channel Pacing Threshold Amplitude: 1.75 V
Lead Channel Pacing Threshold Amplitude: 1.875 V
Lead Channel Pacing Threshold Pulse Width: 0.4 ms
Lead Channel Pacing Threshold Pulse Width: 0.4 ms
Lead Channel Pacing Threshold Pulse Width: 1 ms
Lead Channel Sensing Intrinsic Amplitude: 15.875 mV
Lead Channel Sensing Intrinsic Amplitude: 15.875 mV
Lead Channel Sensing Intrinsic Amplitude: 3.875 mV
Lead Channel Sensing Intrinsic Amplitude: 4.5 mV
Lead Channel Setting Pacing Amplitude: 2 V
Lead Channel Setting Pacing Amplitude: 2.5 V
Lead Channel Setting Pacing Amplitude: 2.5 V
Lead Channel Setting Pacing Pulse Width: 0.4 ms
Lead Channel Setting Pacing Pulse Width: 1 ms
Lead Channel Setting Sensing Sensitivity: 4 mV

## 2019-05-20 NOTE — Patient Instructions (Addendum)
Medication Instructions:  Your physician recommends that you continue on your current medications as directed. Please refer to the Current Medication list given to you today.  Labwork: None ordered.  Testing/Procedures: None ordered.  Follow-Up: Your physician wants you to follow-up in: 4 months with Chanetta Marshall, NP.   You will receive a reminder letter in the mail two months in advance. If you don't receive a letter, please call our office to schedule the follow-up appointment.  Remote monitoring is used to monitor your Pacemaker from home. This monitoring reduces the number of office visits required to check your device to one time per year. It allows [place] to keep an eye on the functioning of your device to ensure it is working properly. You are scheduled for a device check from home on 08/11/2019. You may send your transmission at any time that day. If you have a wireless device, the transmission will be sent automatically. After your physician reviews your transmission, you will receive a postcard with your next transmission date.  Any Other Special Instructions Will Be Listed Below (If Applicable).  If you need a refill on your cardiac medications before your next appointment, please call your pharmacy.

## 2019-05-20 NOTE — Progress Notes (Signed)
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 male who presents today for routine electrophysiology followup.  Since his recent CRT-P upgarde, the patient reports doing very well.  Today, he denies symptoms of palpitations, chest pain, shortness of breath,  lower extremity edema, dizziness, presyncope, or syncope.  The patient is otherwise without complaint today.   Past Medical History:  Diagnosis Date  . Abnormal TSH   . Arthritis    ddd  . Atrial fibrillation and flutter ([place])    s/p DCCV 03/26/2018  . CAD in native artery    a. 11/2015: s/p 2 vessel PCI of the first diagonal branch of the LAD in the mid left circumflex with DES. // s/p CABG 01/2018  . Chronic combined systolic and diastolic CHF (congestive heart failure) ([place])    sees Dr. Rayann Heman   . Complete heart block (HCC)    a. s/p Medtronic ppm 10/2015.  Marland Kitchen History of radiation therapy 2002  . Hodgkin disease ([place]) 2002   a. s/p chest radiation and chemotherapy  . Hypothyroidism    sees Dr. Cruzita Lederer   . Incidental pulmonary nodule 01/19/2018   Left apical opacity - possible scarring  . Mitral regurgitation    s/p bioprosthetic MV replacement 01/2018  . Pacemaker 10/2015   Medtronic  . PVC's (premature ventricular contractions)   . Right bundle branch block   . 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 S/P mitral valve replacement with bioprosthetic valve 01/27/2018   29 mm [facility] Mitral stented bovine pericardial tissue valve   Past Surgical History:  Procedure Laterality Date  . BIV UPGRADE N/A 02/08/2019   Procedure: UPGRADE TO BIV PPM;  Surgeon: [name], MD;  Location: [place] CV LAB;  Service: Cardiovascular;  Laterality: N/A;  . CARDIAC CATHETERIZATION N/A 11/02/2015   Procedure: Left Heart Cath and Coronary Angiography;  Surgeon: Sherren Mocha, MD;  Location: [place] CV LAB;  Service:  Cardiovascular;  Laterality: N/A;  . CARDIAC CATHETERIZATION N/A 11/21/2015   Procedure: Coronary Stent Intervention;  Surgeon: Sherren Mocha, MD;  Location: [place] CV LAB;  Service: Cardiovascular;  Laterality: N/A;  . CARDIAC CATHETERIZATION  11/2017  . CARDIOVERSION N/A 03/26/2018   Procedure: CARDIOVERSION;  Surgeon: [name], MD;  Location: [facility] ENDOSCOPY;  Service: Cardiovascular;  Laterality: N/A;  . COLONOSCOPY  01/16/2015   per Dr. Ardis Hughs, benign polyps, repeat in 10 yrs   . CORONARY ARTERY BYPASS GRAFT N/A 01/27/2018   Procedure: CORONARY ARTERY BYPASS GRAFTING (CABG) x two, using left internal mammary artery and right leg greater saphenous vein harvested endoscopically;  Surgeon: Rexene Alberts, MD;  Location: [place];  Service: Open Heart Surgery;  Laterality: N/A;  . EP IMPLANTABLE DEVICE N/A 11/02/2015   MDT Adivisa MRI conditional dual chamber pacemaker implanted by Dr Rayann Heman for complete heart block  . KNEE SURGERY Right 1972  . LUMBAR LAMINECTOMY  1996  . MITRAL VALVE REPLACEMENT N/A 01/27/2018   Procedure: MITRAL VALVE (MV) REPLACEMENT using Magna Mitral Ease Valve Size 29MM;  Surgeon: Rexene Alberts, MD;  Location: [place];  Service: Open Heart Surgery;  Laterality: N/A;  . RIGHT/LEFT HEART CATH AND CORONARY ANGIOGRAPHY N/A 12/02/2017   Procedure: RIGHT/LEFT HEART CATH AND CORONARY ANGIOGRAPHY;  Surgeon: Jettie Booze, MD;  Location: [place] CV LAB;  Service: Cardiovascular;  Laterality:  N/A;  . TEE WITHOUT CARDIOVERSION N/A 12/09/2017   Procedure: TRANSESOPHAGEAL ECHOCARDIOGRAM (TEE);  Surgeon: Buford Dresser, MD;  Location: [facility] ENDOSCOPY;  Service: Cardiovascular;  Laterality: N/A;  . TEE WITHOUT CARDIOVERSION N/A 01/27/2018   Procedure: TRANSESOPHAGEAL ECHOCARDIOGRAM (TEE);  Surgeon: Rexene Alberts, MD;  Location: [place];  Service: Open Heart Surgery;  Laterality: N/A;    ROS- all systems are reviewed and negative except as per HPI  above  Current Outpatient Medications  Medication Sig Dispense Refill  . aspirin EC 81 MG tablet Take 81 mg by mouth daily.    Marland Kitchen atorvastatin (LIPITOR) 80 MG tablet Take 1 tablet (80 mg total) by mouth daily at 6 PM. 90 tablet 2  . carvedilol (COREG) 6.25 MG tablet TAKE 1 TABLET BY MOUTH 2 TIMES DAILY WITH A MEAL 180 tablet 3  . levothyroxine (SYNTHROID) 75 MCG tablet Take 1 tablet (75 mcg total) by mouth daily before breakfast. 90 tablet 3  . Multiple Vitamin (MULTIVITAMIN WITH MINERALS) TABS tablet Take 1 tablet by mouth daily.    . sacubitril-valsartan (ENTRESTO) 24-26 MG Take 1 tablet by mouth 2 (two) times daily. 180 tablet 3  . sildenafil (VIAGRA) 100 MG tablet Take 1 tablet (100 mg total) by mouth daily as needed for erectile dysfunction. 30 tablet 3  . spironolactone (ALDACTONE) 25 MG tablet Take 0.5 tablets (12.5 mg total) by mouth daily. 45 tablet 3  . traZODone (DESYREL) 50 MG tablet Take 1 tablet (50 mg total) by mouth at bedtime. 90 tablet 3   No current facility-administered medications for this visit.    Physical Exam: Vitals:   05/20/19 0900  BP: 110/64  Pulse: (!) 50  SpO2: 98%  Weight: 190 lb (86.2 kg)  Height: 5\' 11"  (1.803 m)    GEN- The patient is well appearing, alert and oriented x 3 today.   Head- normocephalic, atraumatic Eyes-  Sclera clear, conjunctiva pink Ears- hearing intact Oropharynx- clear Lungs-  normal work of breathing Chest- pacemaker pocket is well healed Heart- Regular rate and rhythm  GI- soft, NT, ND, + BS Extremities- no clubbing, cyanosis, or edema  Pacemaker interrogation- reviewed in detail today,  See PACEART report  ekg tracing ordered today is personally reviewed and shows AV paced  Assessment and Plan:  1. Symptomatic complete heart block Normal pacemaker function Atrial lead impedance has increased gradually.  Atrial threshold is up but only bipolar.  No noise on the lead We will reprogram atrial pacing unipolar.  Keep  sensing bipolar for now See Claudia Desanctis Art report No changes today he is device dependant today  2. Chronic systolic dysfunction/ nonischemic CM Clinically doing well Echo obtained very early after CRT.  Clinically, he is now improving.  We will have him return to see EP NP in 4 months for consideration of echo optimization if worsened.  If symptoms remains improved, we will consider repeat echo 6 months from now. Enroll in ICM device clinic  3. Persistent afib Well controlled On coumadin  4. S/p MV replacement (bioprosthetic) with CABG Stable No change required today  5. HTN Stable No change required today   [name] MD, [facility] 05/20/2019 9:14 AM

## 2019-05-23 MED FILL — ATORVASTATIN 80 MG TABLET: 80 | 90 days supply | Qty: 90 | Fill #1

## 2019-05-25 ENCOUNTER — Telehealth: Payer: Self-pay

## 2019-05-25 NOTE — Telephone Encounter (Signed)
Patient referred to [facility] clinic by Dr Rayann Heman at 05/20/2019 office visit.  ICM intro given and patient declined ICM enrollment at this time.  He was concerned about monthly expense and also felt that he can monitor for fluid level symptoms.  He has the ICM number and encouraged to call if he develops fluid symptoms. Also explained the device clinic checks remote transmission every 3 months and if fluid levels are elevated then he will be referred back to ICM if needed.  No ICM enrollment today.

## 2019-07-08 MED FILL — ENTRESTO 24 MG-26 MG TABLET: 24-26 | 90 days supply | Qty: 180 | Fill #1

## 2019-07-08 MED FILL — CARVEDILOL 6.25 MG TABLET: 6.25 | 90 days supply | Qty: 180 | Fill #1

## 2019-07-28 MED FILL — AMOXICILLIN 500 MG CAPSULE: 500 | 4 days supply | Qty: 16 | Fill #0

## 2019-07-30 MED FILL — LEVOTHYROXINE SODIUM 75 MCG: 75 | 90 days supply | Qty: 90 | Fill #1

## 2019-08-05 DIAGNOSIS — Z1283 Encounter for screening for malignant neoplasm of skin: Secondary | ICD-10-CM | POA: Diagnosis not present

## 2019-08-05 DIAGNOSIS — X32XXXD Exposure to sunlight, subsequent encounter: Secondary | ICD-10-CM | POA: Diagnosis not present

## 2019-08-05 DIAGNOSIS — L570 Actinic keratosis: Secondary | ICD-10-CM | POA: Diagnosis not present

## 2019-08-05 DIAGNOSIS — L821 Other seborrheic keratosis: Secondary | ICD-10-CM | POA: Diagnosis not present

## 2019-08-08 MED FILL — traZODone HCL 50 MG TABS: 50 | 90 days supply | Qty: 90 | Fill #1

## 2019-08-08 MED FILL — SPIRONOLACTONE 25 MG TABS: 25 | 90 days supply | Qty: 45 | Fill #1

## 2019-08-11 ENCOUNTER — Ambulatory Visit (INDEPENDENT_AMBULATORY_CARE_PROVIDER_SITE_OTHER): Payer: 59 | Admitting: *Deleted

## 2019-08-11 DIAGNOSIS — I442 Atrioventricular block, complete: Secondary | ICD-10-CM

## 2019-08-11 LAB — CUP PACEART REMOTE DEVICE CHECK
Battery Remaining Longevity: 96 mo
Battery Voltage: 3.03 V
Brady Statistic AP VP Percent: 67.83 %
Brady Statistic AP VS Percent: 0.03 %
Brady Statistic AS VP Percent: 31.93 %
Brady Statistic AS VS Percent: 0.21 %
Brady Statistic RA Percent Paced: 67.97 %
Brady Statistic RV Percent Paced: 99.76 %
Date Time Interrogation Session: 20210630194531
Implantable Lead Implant Date: 20170922
Implantable Lead Implant Date: 20170922
Implantable Lead Implant Date: 20201229
Implantable Lead Location: 753858
Implantable Lead Location: 753859
Implantable Lead Location: 753860
Implantable Lead Model: 4598
Implantable Lead Model: 5076
Implantable Lead Model: 5076
Implantable Pulse Generator Implant Date: 20201229
Lead Channel Impedance Value: 1596 Ohm
Lead Channel Impedance Value: 304 Ohm
Lead Channel Impedance Value: 380 Ohm
Lead Channel Impedance Value: 418 Ohm
Lead Channel Impedance Value: 437 Ohm
Lead Channel Impedance Value: 456 Ohm
Lead Channel Impedance Value: 494 Ohm
Lead Channel Impedance Value: 513 Ohm
Lead Channel Impedance Value: 589 Ohm
Lead Channel Impedance Value: 741 Ohm
Lead Channel Impedance Value: 798 Ohm
Lead Channel Impedance Value: 874 Ohm
Lead Channel Impedance Value: 893 Ohm
Lead Channel Impedance Value: 931 Ohm
Lead Channel Pacing Threshold Amplitude: 0.5 V
Lead Channel Pacing Threshold Amplitude: 0.75 V
Lead Channel Pacing Threshold Amplitude: 1.875 V
Lead Channel Pacing Threshold Pulse Width: 0.4 ms
Lead Channel Pacing Threshold Pulse Width: 0.4 ms
Lead Channel Pacing Threshold Pulse Width: 1 ms
Lead Channel Sensing Intrinsic Amplitude: 15.875 mV
Lead Channel Sensing Intrinsic Amplitude: 15.875 mV
Lead Channel Sensing Intrinsic Amplitude: 3.5 mV
Lead Channel Sensing Intrinsic Amplitude: 3.5 mV
Lead Channel Setting Pacing Amplitude: 2 V
Lead Channel Setting Pacing Amplitude: 2.5 V
Lead Channel Setting Pacing Amplitude: 2.5 V
Lead Channel Setting Pacing Pulse Width: 0.4 ms
Lead Channel Setting Pacing Pulse Width: 1 ms
Lead Channel Setting Sensing Sensitivity: 4 mV

## 2019-08-12 NOTE — Progress Notes (Signed)
Remote pacemaker transmission.   

## 2019-08-27 MED FILL — ATORVASTATIN 80 MG TABLET: 80 | 90 days supply | Qty: 90 | Fill #2

## 2019-10-06 NOTE — Progress Notes (Signed)
Electrophysiology Office Note Date: 10/07/2019  ID:  Sayer, Derbyshire 1955-02-27, MRN [id number]  PCP: [name], MD Primary Cardiologist: Hilty Electrophysiologist: Allred  CC: Pacemaker follow-up  Tyler Hendrix is a 64 y.o. male seen today for Dr Johney Frame.  He presents today for routine electrophysiology followup.  Since last being seen in our clinic, the patient reports doing very well.  He denies chest pain, palpitations, dyspnea, PND, orthopnea, nausea, vomiting, dizziness, syncope, edema, weight gain, or early satiety.  Device History: MDT dual chamber PPM implanted 2017 for CHB; upgrade to CRTP 2020   Past Medical History:  Diagnosis Date  . Abnormal TSH   . Arthritis    ddd  . Atrial fibrillation and flutter (HCC)    s/p DCCV 03/26/2018  . CAD in native artery    a. 11/2015: s/p 2 vessel PCI of the first diagonal branch of the LAD in the mid left circumflex with DES. // s/p CABG 01/2018  . Chronic combined systolic and diastolic CHF (congestive heart failure) (HCC)    sees Dr. Johney Frame   . Complete heart block (HCC)    a. s/p Medtronic ppm 10/2015.  Marland Kitchen History of radiation therapy 2002  . Hodgkin disease (HCC) 2002   a. s/p chest radiation and chemotherapy  . Hypothyroidism    sees Dr. Elvera Lennox   . Incidental pulmonary nodule 01/19/2018   Left apical opacity - possible scarring  . Mitral regurgitation    s/p bioprosthetic MV replacement 01/2018  . Pacemaker 10/2015   Medtronic  . PVC's (premature ventricular contractions)   . Right bundle branch block   . 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 S/P mitral valve replacement with bioprosthetic valve 01/27/2018   29 mm [facility] Mitral stented bovine pericardial tissue valve   Past Surgical History:  Procedure Laterality Date  . BIV UPGRADE N/A 02/08/2019   Procedure: UPGRADE TO BIV PPM;  Surgeon: Hillis Range, MD;  Location: [facility] INVASIVE CV LAB;  Service:  Cardiovascular;  Laterality: N/A;  . CARDIAC CATHETERIZATION N/A 11/02/2015   Procedure: Left Heart Cath and Coronary Angiography;  Surgeon: Tonny Bollman, MD;  Location: [facility] INVASIVE CV LAB;  Service: Cardiovascular;  Laterality: N/A;  . CARDIAC CATHETERIZATION N/A 11/21/2015   Procedure: Coronary Stent Intervention;  Surgeon: Tonny Bollman, MD;  Location: [facility] INVASIVE CV LAB;  Service: Cardiovascular;  Laterality: N/A;  . CARDIAC CATHETERIZATION  11/2017  . CARDIOVERSION N/A 03/26/2018   Procedure: CARDIOVERSION;  Surgeon: Parke Poisson, MD;  Location: [facility] ENDOSCOPY;  Service: Cardiovascular;  Laterality: N/A;  . COLONOSCOPY  01/16/2015   per Dr. Christella Hartigan, benign polyps, repeat in 10 yrs   . CORONARY ARTERY BYPASS GRAFT N/A 01/27/2018   Procedure: CORONARY ARTERY BYPASS GRAFTING (CABG) x two, using left internal mammary artery and right leg greater saphenous vein harvested endoscopically;  Surgeon: Purcell Nails, MD;  Location: [facility] OR;  Service: Open Heart Surgery;  Laterality: N/A;  . EP IMPLANTABLE DEVICE N/A 11/02/2015   MDT Adivisa MRI conditional dual chamber pacemaker implanted by Dr Johney Frame for complete heart block  . KNEE SURGERY Right 1972  . LUMBAR LAMINECTOMY  1996  . MITRAL VALVE REPLACEMENT N/A 01/27/2018   Procedure: MITRAL VALVE (MV) REPLACEMENT using Magna Mitral Ease Valve Size ;  Surgeon: Purcell Nails, MD;  Location: [facility] OR;  Service: Open Heart Surgery;  Laterality: N/A;  . RIGHT/LEFT HEART CATH AND  CORONARY ANGIOGRAPHY N/A 12/02/2017   Procedure: RIGHT/LEFT HEART CATH AND CORONARY ANGIOGRAPHY;  Surgeon: Corky Crafts, MD;  Location: [facility] INVASIVE CV LAB;  Service: Cardiovascular;  Laterality: N/A;  . TEE WITHOUT CARDIOVERSION N/A 12/09/2017   Procedure: TRANSESOPHAGEAL ECHOCARDIOGRAM (TEE);  Surgeon: Jodelle Red, MD;  Location: [facility] ENDOSCOPY;  Service: Cardiovascular;  Laterality: N/A;  . TEE WITHOUT CARDIOVERSION N/A 01/27/2018   Procedure:  TRANSESOPHAGEAL ECHOCARDIOGRAM (TEE);  Surgeon: Purcell Nails, MD;  Location: [facility] OR;  Service: Open Heart Surgery;  Laterality: N/A;    Current Outpatient Medications  Medication Sig Dispense Refill  . amoxicillin (AMOXIL) 500 MG capsule SMARTSIG:4 Capsule(s) By Mouth    . aspirin EC 81 MG tablet Take 81 mg by mouth daily.    . carvedilol (COREG) 6.25 MG tablet TAKE 1 TABLET BY MOUTH 2 TIMES DAILY WITH A MEAL 180 tablet 3  . levothyroxine (SYNTHROID) 75 MCG tablet Take 1 tablet (75 mcg total) by mouth daily before breakfast. 90 tablet 3  . Multiple Vitamin (MULTIVITAMIN WITH MINERALS) TABS tablet Take 1 tablet by mouth daily.    . sacubitril-valsartan (ENTRESTO) 24-26 MG Take 1 tablet by mouth 2 (two) times daily. 180 tablet 3  . sildenafil (VIAGRA) 100 MG tablet Take 1 tablet (100 mg total) by mouth daily as needed for erectile dysfunction. 30 tablet 3  . spironolactone (ALDACTONE) 25 MG tablet Take 0.5 tablets (12.5 mg total) by mouth daily. 45 tablet 3  . traZODone (DESYREL) 50 MG tablet Take 1 tablet (50 mg total) by mouth at bedtime. 90 tablet 3   No current facility-administered medications for this visit.    Allergies:   Other   Social History: Social History   Socioeconomic History  . Marital status: Married    Spouse name: Not on file  . Number of children: 2  . Years of education: Masters  . Highest education level: Not on file  Occupational History  . Occupation: Clinical research associate    Comment: [employer]  Tobacco Use  . Smoking status: Never Smoker  . Smokeless tobacco: Never Used  Vaping Use  . Vaping Use: Never used  Substance and Sexual Activity  . Alcohol use: Yes    Alcohol/week: 5.0 standard drinks    Types: 5 Glasses of wine per week  . Drug use: No  . Sexual activity: Not on file  Other Topics Concern  . 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 Difficulty of Paying Living Expenses: Not on file  Food Insecurity:   . Worried About Programme researcher, broadcasting/film/video in the Last Year: Not on file  . Ran Out of Food in the Last Year: Not on file  Transportation Needs:   . Lack of Transportation (Medical): Not on file  . Lack of Transportation (Non-Medical): Not on file  Physical Activity:   . Days of Exercise per Week: Not on file  . Minutes of Exercise per Session: Not on file  Stress:   . Feeling of Stress : Not on file  Social Connections:   . Frequency of Communication with Friends and Family: Not on file  . Frequency of Social Gatherings with Friends and Family: Not on file  . Attends Religious Services: Not on file  . Active Member of Clubs or Organizations: Not on file  . Attends Banker Meetings: Not on  file  . Marital Status: Not on file  Intimate Partner Violence:   . Fear of Current or Ex-Partner: Not on file  . Emotionally Abused: Not on file  . Physically Abused: Not on file  . Sexually Abused: Not on file    Family History: Family History  Problem Relation Age of Onset  . Cancer Other        prostate father hx  . Heart disease Mother   . Heart disease Father   . Colon cancer Neg Hx      Review of Systems: All other systems reviewed and are otherwise negative except as noted above.   Physical Exam: VS:  BP 114/64   Pulse (!) 50   Ht 5\' 11"  (1.803 m)   Wt 191 lb 9.6 oz (86.9 kg)   SpO2 97%   BMI 26.72 kg/m  , BMI Body mass index is 26.72 kg/m.  GEN- The patient is well appearing, alert and oriented x 3 today.   HEENT: normocephalic, atraumatic; sclera clear, conjunctiva pink; hearing intact; oropharynx clear; neck supple  Lungs- Clear to ausculation bilaterally, normal work of breathing.  No wheezes, rales, rhonchi Heart- Regular rate and rhythm, no murmurs, rubs or gallops  GI- soft, non-tender, non-distended, bowel sounds present  Extremities- no clubbing, cyanosis, or edema  MS-  no significant deformity or atrophy Skin- warm and dry, no rash or lesion; PPM pocket well healed Psych- euthymic mood, full affect Neuro- strength and sensation are intact  PPM Interrogation- reviewed in detail today,  See PACEART report    Recent Labs: 03/30/2019: ALT 30; BUN 15; Creatinine, Ser 1.04; Hemoglobin 14.0; Platelets 241.0; Potassium 4.7; Sodium 139 05/18/2019: TSH 2.84   Wt Readings from Last 3 Encounters:  10/07/19 191 lb 9.6 oz (86.9 kg)  05/20/19 190 lb (86.2 kg)  05/18/19 189 lb 3.2 oz (85.8 kg)     Other studies Reviewed: Additional studies/ records that were reviewed today include: Dr Jenel Lucks notes  Assessment and Plan:  1.  Symptomatic complete heart block Normal PPM function See Pace Art report No changes today RA lead impedance gradually increasing.  Stable with bipolar sensing and unipolar pacing LV auto threshold was causing diaphragmatic stim - turned off today   2.  Chronic systolic heart failure Clinically stable Update echo 11/2019 Histograms blunted. Rate response turned on today  3.  S/p MVR with CABG Stable No change required today Update echo as above  4.  Persistent atrial fibrillation Burden by device interrogation 0% Continue Warfarin  5.  HTN Stable No change required today    Current medicines are reviewed at length with the patient today.   The patient does not have concerns regarding his medicines.  The following changes were made today:  none  Labs/ tests ordered today include: echo 11/2019 Orders Placed This Encounter  Procedures  . ECHOCARDIOGRAM COMPLETE     Disposition:   Follow up with remotes, Dr Johney Frame 4 months     Signed, Gypsy Balsam, NP 10/07/2019 9:57 AM  [facility] [address] [place] [address] [phone] (office) [phone] (fax)

## 2019-10-07 ENCOUNTER — Ambulatory Visit (INDEPENDENT_AMBULATORY_CARE_PROVIDER_SITE_OTHER): Payer: 59 | Admitting: Nurse Practitioner

## 2019-10-07 ENCOUNTER — Other Ambulatory Visit: Payer: Self-pay

## 2019-10-07 ENCOUNTER — Encounter: Payer: Self-pay | Admitting: Nurse Practitioner

## 2019-10-07 VITALS — BP 114/64 | HR 50 | Ht 71.0 in | Wt 191.6 lb

## 2019-10-07 DIAGNOSIS — I428 Other cardiomyopathies: Secondary | ICD-10-CM

## 2019-10-07 DIAGNOSIS — I4819 Other persistent atrial fibrillation: Secondary | ICD-10-CM

## 2019-10-07 DIAGNOSIS — I5022 Chronic systolic (congestive) heart failure: Secondary | ICD-10-CM | POA: Diagnosis not present

## 2019-10-07 DIAGNOSIS — I442 Atrioventricular block, complete: Secondary | ICD-10-CM

## 2019-10-07 LAB — CUP PACEART INCLINIC DEVICE CHECK
Battery Remaining Longevity: 99 mo
Battery Voltage: 3.01 V
Brady Statistic AP VP Percent: 70.04 %
Brady Statistic AP VS Percent: 0.03 %
Brady Statistic AS VP Percent: 29.76 %
Brady Statistic AS VS Percent: 0.17 %
Brady Statistic RA Percent Paced: 70.15 %
Brady Statistic RV Percent Paced: 99.79 %
Date Time Interrogation Session: 20210827095858
Implantable Lead Implant Date: 20170922
Implantable Lead Implant Date: 20170922
Implantable Lead Implant Date: 20201229
Implantable Lead Location: 753858
Implantable Lead Location: 753859
Implantable Lead Location: 753860
Implantable Lead Model: 4598
Implantable Lead Model: 5076
Implantable Lead Model: 5076
Implantable Pulse Generator Implant Date: 20201229
Lead Channel Impedance Value: 1862 Ohm
Lead Channel Impedance Value: 304 Ohm
Lead Channel Impedance Value: 399 Ohm
Lead Channel Impedance Value: 437 Ohm
Lead Channel Impedance Value: 456 Ohm
Lead Channel Impedance Value: 475 Ohm
Lead Channel Impedance Value: 494 Ohm
Lead Channel Impedance Value: 532 Ohm
Lead Channel Impedance Value: 551 Ohm
Lead Channel Impedance Value: 760 Ohm
Lead Channel Impedance Value: 760 Ohm
Lead Channel Impedance Value: 836 Ohm
Lead Channel Impedance Value: 855 Ohm
Lead Channel Impedance Value: 893 Ohm
Lead Channel Pacing Threshold Amplitude: 0.5 V
Lead Channel Pacing Threshold Amplitude: 0.75 V
Lead Channel Pacing Threshold Amplitude: 2.125 V
Lead Channel Pacing Threshold Pulse Width: 0.4 ms
Lead Channel Pacing Threshold Pulse Width: 0.4 ms
Lead Channel Pacing Threshold Pulse Width: 1 ms
Lead Channel Sensing Intrinsic Amplitude: 15.875 mV
Lead Channel Sensing Intrinsic Amplitude: 15.875 mV
Lead Channel Sensing Intrinsic Amplitude: 4.125 mV
Lead Channel Sensing Intrinsic Amplitude: 4.375 mV
Lead Channel Setting Pacing Amplitude: 2 V
Lead Channel Setting Pacing Amplitude: 2.5 V
Lead Channel Setting Pacing Amplitude: 2.5 V
Lead Channel Setting Pacing Pulse Width: 0.4 ms
Lead Channel Setting Pacing Pulse Width: 1 ms
Lead Channel Setting Sensing Sensitivity: 4 mV

## 2019-10-07 NOTE — Patient Instructions (Signed)
Medication Instructions:  *If you need a refill on your cardiac medications before your next appointment, please call your pharmacy*  Lab Work: If you have labs (blood work) drawn today and your tests are completely normal, you will receive your results only by: Marland Kitchen MyChart Message (if you have MyChart) OR . A paper copy in the mail If you have any lab test that is abnormal or we need to change your treatment, we will call you to review the results.  Testing/Procedures: Your physician has requested that you have an echocardiogram in October. Echocardiography is a painless test that uses sound waves to create images of your heart. It provides your doctor with information about the size and shape of your heart and how well your heart's chambers and valves are working. This procedure takes approximately one hour. There are no restrictions for this procedure.  Follow-Up: At [facility], you and your health needs are our priority.  As part of our continuing mission to provide you with exceptional heart care, we have created designated Provider Care Teams.  These Care Teams include your primary Cardiologist (physician) and Advanced Practice Providers (APPs -  Physician Assistants and Nurse Practitioners) who all work together to provide you with the care you need, when you need it.  We recommend signing up for the patient portal called "MyChart".  Sign up information is provided on this After Visit Summary.  MyChart is used to connect with patients for Virtual Visits (Telemedicine).  Patients are able to view lab/test results, encounter notes, upcoming appointments, etc.  Non-urgent messages can be sent to your provider as well.   To learn more about what you can do with MyChart, go to [email or web address].   Your next appointment:   Your physician wants you to follow-up in: 16 MONTHS with Dr. Rayann Heman.   Remote monitoring is used to monitor your Pacemaker from home. This monitoring reduces the  number of office visits required to check your device to one time per year. It allows [place] to keep an eye on the functioning of your device to ensure it is working properly. You are scheduled for a device check from home on 11/10/19. You may send your transmission at any time that day. If you have a wireless device, the transmission will be sent automatically. After your physician reviews your transmission, you will receive a postcard with your next transmission date.  The format for your next appointment:   In Person with [name], MD

## 2019-10-11 MED FILL — ENTRESTO 24 MG-26 MG TABLET: 24-26 | 90 days supply | Qty: 180 | Fill #2

## 2019-10-13 ENCOUNTER — Other Ambulatory Visit: Payer: Self-pay | Admitting: Internal Medicine

## 2019-10-13 ENCOUNTER — Other Ambulatory Visit: Payer: Self-pay

## 2019-10-13 ENCOUNTER — Encounter: Payer: Self-pay | Admitting: Internal Medicine

## 2019-10-13 ENCOUNTER — Ambulatory Visit (INDEPENDENT_AMBULATORY_CARE_PROVIDER_SITE_OTHER): Payer: 59 | Admitting: Internal Medicine

## 2019-10-13 VITALS — BP 125/68 | HR 60 | Ht 71.0 in | Wt 191.2 lb

## 2019-10-13 DIAGNOSIS — I428 Other cardiomyopathies: Secondary | ICD-10-CM | POA: Diagnosis not present

## 2019-10-13 DIAGNOSIS — I442 Atrioventricular block, complete: Secondary | ICD-10-CM

## 2019-10-13 DIAGNOSIS — I4819 Other persistent atrial fibrillation: Secondary | ICD-10-CM

## 2019-10-13 DIAGNOSIS — I5022 Chronic systolic (congestive) heart failure: Secondary | ICD-10-CM | POA: Diagnosis not present

## 2019-10-13 DIAGNOSIS — Z950 Presence of cardiac pacemaker: Secondary | ICD-10-CM | POA: Diagnosis not present

## 2019-10-13 DIAGNOSIS — E782 Mixed hyperlipidemia: Secondary | ICD-10-CM

## 2019-10-13 MED ORDER — ATORVASTATIN CALCIUM 40 MG PO TABS: 40.0000 mg | ORAL_TABLET | Freq: Every day | ORAL | 3 refills | Status: DC

## 2019-10-13 NOTE — Progress Notes (Signed)
OFFICE NOTE  Chief Complaint:  Exercise fatigue  Primary Care Physician: Laurey Morale, MD  HPI:  Tyler Hendrix is a pleasant 64 year old male who was previously seen by Dr. Acie Fredrickson in September of this past year. He is an add on to my schedule today for acute symptoms of palpitations. He reports over the past several days she's had skipped heartbeats and higher heart rates. He says his heart rate is typically in the 60s however recently has been in the 80s. He's also felt some skipped beats and they were happening 4-5 times a minute. He denies any chest pain or worsening shortness of breath. He's never had a stress test or any coronary disease evaluation. He does have a history of Hodgkin's lymphoma and had chest wall radiation. In September he had an echocardiogram which showed normal systolic function and it was felt that he was doing fairly well. He continued to do exercise which she has recently without any significant symptoms. 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   Tyler Hendrix returns today for follow-up. Overall he is feeling fairly well. He reports his palpitations have improved somewhat but he still feeling him. While wearing the monitor he was noted to have significant ventricular ectopy including isolated PVCs, and ventricular trigeminy and quadrigeminy. His nuclear stress test was negative for ischemia but was not gated. He did have an echo last fall which showed an EF of 55% which is reassuring.   At the pleasure see Tyler Hendrix back in the office today for follow-up. Overall he thinks he is doing much better on the high-dose of metoprolol 25 mg daily. Although he's tolerating this with very few palpitations, he still has some breakthrough in the afternoon. He generally takes the medicine in the morning. His EKG today however does show a low heart rate of 48, with a short  period to sinus rhythm and a competing junctional rhythm. I've had concerns about underlying conduction system disease based on his EKG showing a right bundle branch block and now there is some evidence of a junctional bradycardia. Despite this he has a good energy level, denies chest pain and is able to maintain normal physical activity. His wife recently had their new baby about 2 weeks ago.  06/22/2015  Tyler Hendrix returns today for follow-up. He reports that he is overall done fairly well. He denies any worsening shortness of breath or fatigue. Heart rate remains in the upper 40s although he has very little ventricular ectopy and his EKG today. He denies any significant palpitations. I think we found a balance currently between low-dose beta blocker and his underlying conduction disease. Should he have more symptoms however he may need antiarrhythmic medication. I do not see any features that are concerning for pacemaker at this point. Blood pressure, however remains elevated and not at goal.  10/04/2015  Tyler Hendrix was seen back today in follow-up. He reports a marked improvement in his PVCs however over the past week he's felt slightly more short of breath with exertion. He did not voluntarily mention this yet required significant persistent questioning on my part to get an answer out of him. He also said that he might have a little pressure in his chest when he tried to exercise. He denied any pain, dizziness, presyncope or syncopal symptoms. Blood pressure is improved somewhat after the  addition of amlodipine. He's no longer having any PVCs however his EKG is abnormal today showing a new complete heart block. Heart rate is 44 with a sinus rhythm and wide QRS. There are inferior T-wave abnormalities which were previously seen on his EKG.  10/05/2015  Tyler Hendrix was seen today in follow-up of complete heart block. This was noted yesterday during his office visit. Heart rate was in the 40s and he was  somewhat diaphoretic. He denied any chest pain or shortness of breath but has felt fatigue recently. He had taken Toprol XL the night before and I advised him to not take any last evening and allow the medicine to wash out of his system. A today he is a heart rate has improved up to 88 and is in sinus rhythm with first-degree AV block and bifascicular block. He thinks he actually feels a little better today although was not diaphoretic. He said he didn't sleep well last night and had some diarrhea this morning. He does not noted change in energy level although he feels that he's had some recurrent palpitations.  01/01/2016  Tyler Hendrix returns today for follow-up. He underwent placement of a pacemaker which was uneventful for complete heart block. He says since that time his color is improved energy is better. Heart rate today is 61 however the pacer is set at a backup rate of 50. He was concerned that this may be still causing a little fatigue but I reassured him that I think that his heart rate is over that number and unlikely to be the cause. Blood pressure is well-controlled. The pacemaker site looks appropriate without any hematoma or effusion. He was recently seen by Almyra Deforest, PA-C , and was noted to have an elevated TSH. This is been elevated in the past but was as high as 9. Free T4 was ordered and those labs are pending. He did have radiation to his thyroid as part of treatment for Hodgkin's lymphoma.  04/18/2015  Tyler Hendrix was seen today in follow-up. Overall he seems to be doing very well. After discharge his amlodipine was increased to 10 mg from 5 mg. I rechecked his blood pressure today was 104/48, and he has noted that occasionally he gets a flushed feeling when exercising. I suspect he may be on too much medicine. We did discuss that amlodipine although does have a benefit for controlling blood pressure, has no intrinsic cardiovascular benefit. He does have normal renal function would probably  benefit from being on an ACE or ARB. He denies any chest pain or worsening shortness of breath. No problems with his pacemaker. He has been seeing an endocrinologist and his thyroid function has normalized on low-dose levothyroxine.  10/17/2016  Mr. Manship returns today for follow-up. He continues to do well. Recently he had his valsartan and switch to [place] losartan. He was feeling a little fatigue any decrease the dose from 20 mg to 10 mg daily. Blood pressure he said was running low for him. Today seems to be normal at 112/68. He is going to see his primary care provider about retesting of his testosterone. Apparently in the past this is been low as a result of chemotherapy and radiation. 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 He denies any chest pain. He's had no presyncope or syncopal symptoms. His pacemaker is been functioning appropriately on remote checks. This now almost one year since his two-vessel PCI.  04/24/2017  Mr. Grudzinski returns today for follow-up.  Overall he is doing well.  He denies any chest pain or worsening shortness of breath.  Blood pressure was elevated today however came down to 128/56.  He remains a heart rate of 50 which is his backup pacer setting.  He is in complete heart block and testing is indicated that he is pacemaker dependent.  He does do remote checks.  He had two-vessel coronary artery disease and has been asymptomatic with that.  He is on aspirin and high-dose atorvastatin.  He has a physical exam coming up in a few weeks with his primary care provider who will get cholesterol testing at that time.  He is also on olmesartan for blood pressure.  04/12/2018  Mr. Dygert is seen today in follow-up.  He had a very eventful last several months.  As above, he has a history of heart block with permanent pacemaker placement in 2017, CAD status post multivessel PCI in 2017, chronic combined  systolic and diastolic heart failure, status post bioprosthetic MVR and CABG x2 in December 2019, and new onset atrial fibrillation status post cardioversion on 03/26/2018.  He again presented recently with worsening shortness of breath and tachycardia and elective cardioversion was recommended.  In addition he was found to be in significant heart failure and was heavily diuresed.  Since discharge his weight has been down although he did gain back a few pounds.  Despite this, he has no evidence of any heart failure today.  In fact recently he has been getting lightheaded and dizzy and it was advised that he discontinue Lasix and potassium.  He is on Entresto.  His LVEF had decreased to 25% from the mid 40% range.  He is also on warfarin which was started initially for his mitral valve surgery, but has been continued because of A. fib.  There is a question of whether he has valvular A. Fib.  03/20/2019  Mr. Pizzuto returns today for follow-up.  He reports feeling much better.  He underwent biventricular upgrade of his device in December and since then has been doing better with improved energy and less heart failure symptoms.  Blood pressure today is well controlled 130/71.  He reports his weight is stable.  We had discussed uptitrating his Delene Loll however when I suggested we may consider that today he did not want to change his medicines.  His INR has been therapeutic on warfarin.  We discussed whether he possibly could come off of this since his A. fib was post CABG.  He also is being monitored with his device.  I recommended deferring this decision to Dr.Allred who is the A. fib expert when he follows up with him in April.  In addition we discussed a follow-up echo which should occur prior to that visit.  10/13/2019  Mr. Trimm is seen today in follow-up.  Overall he seems to be doing well although reports some fatiguing with exercise.  He says he has been seen by Dr. Rayann Heman in just last week by Chanetta Marshall,  NP for reevaluation of his biventricular pacer.  He noted that his heart rate is not increasing significantly with exertion.  Apparently the device could have a setting enabled which would allow more of that.  He was not sure why that was not programmed initially.  He has not really done a lot of exercise or activity to test his heart rate but is interested to see how he feels with that.  His LVEF  had improved somewhat up to 40% after biventricular upgrade however it was felt that this was too early to recheck it and is due for a repeat echo in October.  Also recently he thought that he was having some side effects including issues with memory or brain fog on a statin.  He had stopped it about a week ago.  He was on 80 mg of atorvastatin however his LDL was well controlled at 50.  PMHx:  Past Medical History:  Diagnosis Date  . Abnormal TSH   . Arthritis    ddd  . Atrial fibrillation and flutter ([place])    s/p DCCV 03/26/2018  . CAD in native artery    a. 11/2015: s/p 2 vessel PCI of the first diagonal branch of the LAD in the mid left circumflex with DES. // s/p CABG 01/2018  . Chronic combined systolic and diastolic CHF (congestive heart failure) ([place])    sees Dr. Rayann Heman   . Complete heart block ([facility])    a. s/p Medtronic ppm 10/2015.  Marland Kitchen History of radiation therapy 2002  . Hodgkin disease ([place]) 2002   a. s/p chest radiation and chemotherapy  . Hypothyroidism    sees Dr. Cruzita Lederer   . Incidental pulmonary nodule 01/19/2018   Left apical opacity - possible scarring  . Mitral regurgitation    s/p bioprosthetic MV replacement 01/2018  . Pacemaker 10/2015   Medtronic  . PVC's (premature ventricular contractions)   . Right bundle branch block   . 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 S/P mitral valve replacement with bioprosthetic valve 01/27/2018   29 mm [facility] Mitral stented bovine pericardial tissue valve    Past Surgical History:  Procedure  Laterality Date  . BIV UPGRADE N/A 02/08/2019   Procedure: UPGRADE TO BIV PPM;  Surgeon: [name], MD;  Location: [place] CV LAB;  Service: Cardiovascular;  Laterality: N/A;  . CARDIAC CATHETERIZATION N/A 11/02/2015   Procedure: Left Heart Cath and Coronary Angiography;  Surgeon: Sherren Mocha, MD;  Location: [place] CV LAB;  Service: Cardiovascular;  Laterality: N/A;  . CARDIAC CATHETERIZATION N/A 11/21/2015   Procedure: Coronary Stent Intervention;  Surgeon: Sherren Mocha, MD;  Location: [place] CV LAB;  Service: Cardiovascular;  Laterality: N/A;  . CARDIAC CATHETERIZATION  11/2017  . CARDIOVERSION N/A 03/26/2018   Procedure: CARDIOVERSION;  Surgeon: [name], MD;  Location: [facility] ENDOSCOPY;  Service: Cardiovascular;  Laterality: N/A;  . COLONOSCOPY  01/16/2015   per Dr. Ardis Hughs, benign polyps, repeat in 10 yrs   . CORONARY ARTERY BYPASS GRAFT N/A 01/27/2018   Procedure: CORONARY ARTERY BYPASS GRAFTING (CABG) x two, using left internal mammary artery and right leg greater saphenous vein harvested endoscopically;  Surgeon: Rexene Alberts, MD;  Location: [place];  Service: Open Heart Surgery;  Laterality: N/A;  . EP IMPLANTABLE DEVICE N/A 11/02/2015   MDT Adivisa MRI conditional dual chamber pacemaker implanted by Dr Rayann Heman for complete heart block  . KNEE SURGERY Right 1972  . LUMBAR LAMINECTOMY  1996  . MITRAL VALVE REPLACEMENT N/A 01/27/2018   Procedure: MITRAL VALVE (MV) REPLACEMENT using Magna Mitral Ease Valve Size 29MM;  Surgeon: Rexene Alberts, MD;  Location: [place];  Service: Open Heart Surgery;  Laterality: N/A;  . RIGHT/LEFT HEART CATH AND CORONARY ANGIOGRAPHY N/A 12/02/2017   Procedure: RIGHT/LEFT HEART CATH AND CORONARY ANGIOGRAPHY;  Surgeon: Jettie Booze, MD;  Location: [place]  CV LAB;  Service: Cardiovascular;  Laterality: N/A;  . TEE WITHOUT CARDIOVERSION N/A 12/09/2017   Procedure: TRANSESOPHAGEAL ECHOCARDIOGRAM (TEE);  Surgeon: Buford Dresser, MD;  Location: [facility] ENDOSCOPY;  Service: Cardiovascular;  Laterality: N/A;  . TEE WITHOUT CARDIOVERSION N/A 01/27/2018   Procedure: TRANSESOPHAGEAL ECHOCARDIOGRAM (TEE);  Surgeon: Rexene Alberts, MD;  Location: [place];  Service: Open Heart Surgery;  Laterality: N/A;    FAMHx:  Family History  Problem Relation Age of Onset  . Cancer Other        prostate father hx  . Heart disease Mother   . Heart disease Father   . Colon cancer Neg Hx     SOCHx:   reports that he has never smoked. He has never used smokeless tobacco. He reports current alcohol use of about 5.0 standard drinks of alcohol per week. He reports that he does not use drugs.  ALLERGIES:  Allergies  Allergen Reactions  . Other     Pt reported allergic to dust mites that causes of sneezing, runny nose    ROS: Pertinent items noted in HPI and remainder of comprehensive ROS otherwise negative.  HOME MEDS: Current Outpatient Medications  Medication Sig Dispense Refill  . amoxicillin (AMOXIL) 500 MG capsule SMARTSIG:4 Capsule(s) By Mouth    . aspirin EC 81 MG tablet Take 81 mg by mouth daily.    . carvedilol (COREG) 6.25 MG tablet TAKE 1 TABLET BY MOUTH 2 TIMES DAILY WITH A MEAL 180 tablet 3  . levothyroxine (SYNTHROID) 75 MCG tablet Take 1 tablet (75 mcg total) by mouth daily before breakfast. 90 tablet 3  . Multiple Vitamin (MULTIVITAMIN WITH MINERALS) TABS tablet Take 1 tablet by mouth daily.    . sacubitril-valsartan (ENTRESTO) 24-26 MG Take 1 tablet by mouth 2 (two) times daily. 180 tablet 3  . sildenafil (VIAGRA) 100 MG tablet Take 1 tablet (100 mg total) by mouth daily as needed for erectile dysfunction. 30 tablet 3  . spironolactone (ALDACTONE) 25 MG tablet Take 0.5 tablets (12.5 mg total) by mouth daily. 45 tablet 3  . traZODone (DESYREL) 50 MG tablet Take 1 tablet (50 mg total) by mouth at bedtime. 90 tablet 3   No current facility-administered medications for this visit.    LABS/IMAGING: No  results found for this or any previous visit (from the past 48 hour(s)). No results found.  WEIGHTS: Wt Readings from Last 3 Encounters:  10/13/19 191 lb 3.2 oz (86.7 kg)  10/07/19 191 lb 9.6 oz (86.9 kg)  05/20/19 190 lb (86.2 kg)    VITALS: BP 125/68   Pulse 60   Ht 5\' 11"  (1.803 m)   Wt 191 lb 3.2 oz (86.7 kg)   SpO2 99%   BMI 26.67 kg/m   EXAM: General appearance: alert and no distress Neck: no carotid bruit, no JVD and thyroid not enlarged, symmetric, no tenderness/mass/nodules Lungs: clear to auscultation bilaterally Heart: regular rate and rhythm, S1, S2 normal, no murmur, click, rub or gallop Abdomen: soft, non-tender; bowel sounds normal; no masses,  no organomegaly Extremities: extremities normal, atraumatic, no cyanosis or edema Pulses: 2+ and symmetric Skin: Skin color, texture, turgor normal. No rashes or lesions Neurologic: Grossly normal Psych: Pleasant  EKG: Biventricular paced rhythm at 60-personally reviewed  ASSESSMENT: 1. CAD - s/p PCI to the D1 and mid LCX with DES (11/2015) 2. Status post CABG x2 (LIMA to LAD, SVG to RCA) and bioprosthetic MVR -29 mm Edwards magna bovine bioprosthesis (01/2018) 3. Complete heart block-s/p Medtronic  pacemaker (10/2015) with CRT-P upgrade  (01/2019) 4. Acute on chronic systolic congestive heart failure with EF as low as 25% (2019) 5. Palpitations - PVCs including trigeminy and quadrigeminy, generally improved on b-blocker 6. Abnormal EKG with bifascicular block and competing junctional bradycardia 7. History of chest wall radiation for Hodgkin's lymphoma 8. Essential hypertension 9. Hypothyroidism 10. Paroxysmal atrial fibrillation on warfarin  PLAN: 1.   Mr. Lemire continues to have some symptoms which might have been related to biventricular pacer settings.  Recently the settings were changed.  He is eager to try to do more exercise and see how he feels with that.  He also has some memory issues/brain fog.  He  thought it might be related to the statin and he stopped it about a week ago.  Is not clear if this is improved significantly however as his LDL cholesterol was well controlled at 50, I advised him to cut the dose back to 40 mg of atorvastatin daily and see if this is better tolerated.  Follow-up with me in 6 months.  Pixie Casino, MD, [facility], [place] Director of the Advanced Lipid Disorders &  Cardiovascular Risk Reduction Clinic Diplomate of the American Board of Clinical Lipidology Attending Cardiologist  Direct Dial: [phone]  Fax: [phone]  Website:  [email or web address] 10/13/2019, 3:39 PM

## 2019-10-13 NOTE — Patient Instructions (Addendum)
Medication Instructions:  Your physician has recommended you make the following change in your medication:  1.  RESTART Atorvastatin at 40 mg taking 1 daily.   You can cut the 80 mg tablets in 1/2 and use them up.  A new prescription for the 40 mg tablets will be at your pharmacy when you need them.    *If you need a refill on your cardiac medications before your next appointment, please call your pharmacy*   Lab Work: BEFORE YOUR NEXT APPT:  COME TO THE OFFICE FOR FASTING LIPID   If you have labs (blood work) drawn today and your tests are completely normal, you will receive your results only by: Marland Kitchen MyChart Message (if you have MyChart) OR . A paper copy in the mail If you have any lab test that is abnormal or we need to change your treatment, we will call you to review the results.   Testing/Procedures: None ordered   Follow-Up: At [facility], you and your health needs are our priority.  As part of our continuing mission to provide you with exceptional heart care, we have created designated Provider Care Teams.  These Care Teams include your primary Cardiologist (physician) and Advanced Practice Providers (APPs -  Physician Assistants and Nurse Practitioners) who all work together to provide you with the care you need, when you need it.  We recommend signing up for the patient portal called "MyChart".  Sign up information is provided on this After Visit Summary.  MyChart is used to connect with patients for Virtual Visits (Telemedicine).  Patients are able to view lab/test results, encounter notes, upcoming appointments, etc.  Non-urgent messages can be sent to your provider as well.   To learn more about what you can do with MyChart, go to [email or web address].    Your next appointment:   5 6 month(s)  The format for your next appointment:   In Person  Provider:   You may see Pixie Casino, MD or one of the following Advanced Practice Providers on your designated Care  Team:    Almyra Deforest, PA-C  Fabian Sharp, PA-C or   Roby Lofts, [place]    Other Instructions

## 2019-10-18 MED FILL — CARVEDILOL 6.25 MG TABLET: 6.25 | 90 days supply | Qty: 180 | Fill #2

## 2019-10-27 IMAGING — DX DG CHEST 1V PORT
1 series · 1 of 1 positions shown · non-contrast
Comparison: 02/22/2018

CLINICAL DATA: Palpitations. Shortness of breath.

EXAM:
PORTABLE CHEST 1 VIEW

[chest ap]
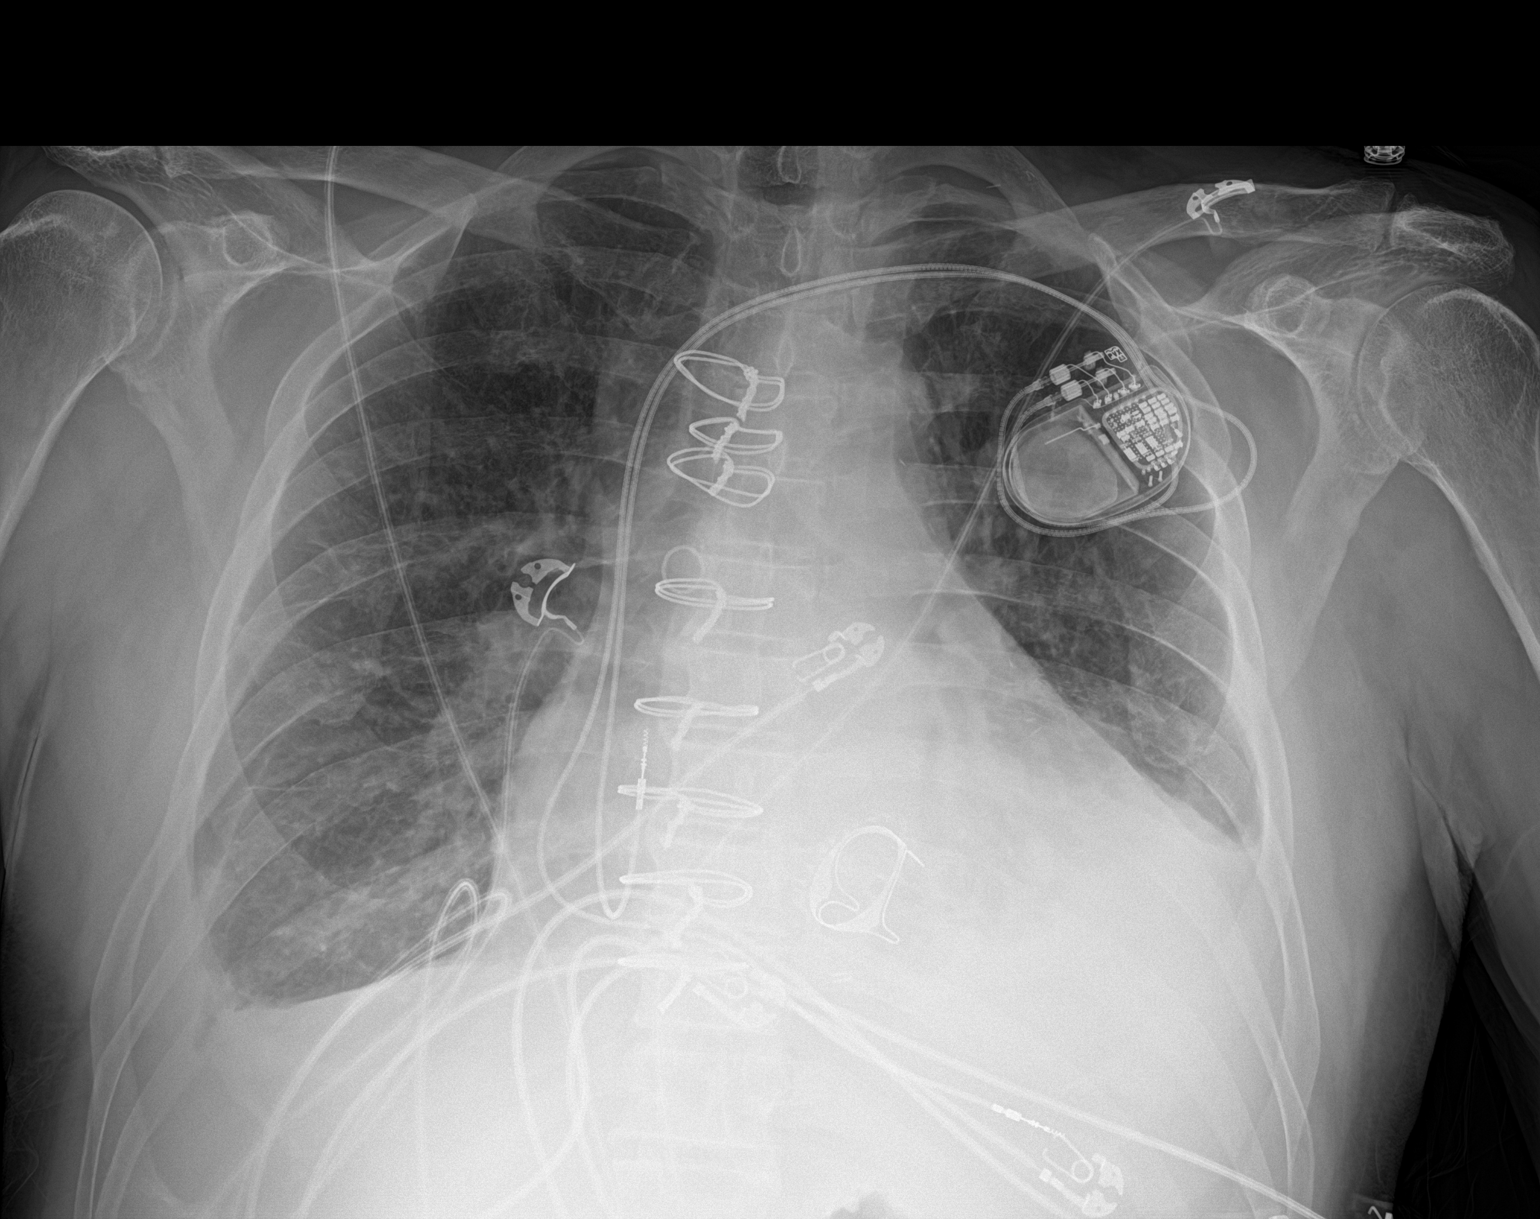

[1 of 1 positions shown; findings below may reference images not displayed]

FINDINGS: Sequelae of mitral valve replacement and CABG are again identified.
A dual chamber pacemaker remains in place. The cardiac silhouette
remains enlarged. There are small bilateral pleural effusions which
are larger than on the prior study. Left basilar airspace opacity is
new. There is also mild pulmonary vascular congestion and new mild
diffuse prominence of the interstitial markings. No pneumothorax is
identified. No acute osseous abnormality is seen.
IMPRESSION: 1. Cardiomegaly with small bilateral pleural effusions and suspected
mild interstitial edema.
2.  Left basilar atelectasis versus pneumonia.

## 2019-11-07 MED FILL — SPIRONOLACTONE 25 MG TABS: 25 | 90 days supply | Qty: 45 | Fill #2

## 2019-11-07 MED FILL — LEVOTHYROXINE SODIUM 75 MCG: 75 | 90 days supply | Qty: 90 | Fill #2

## 2019-11-10 ENCOUNTER — Ambulatory Visit (INDEPENDENT_AMBULATORY_CARE_PROVIDER_SITE_OTHER): Payer: 59

## 2019-11-10 DIAGNOSIS — I442 Atrioventricular block, complete: Secondary | ICD-10-CM

## 2019-11-12 LAB — CUP PACEART REMOTE DEVICE CHECK
Battery Remaining Longevity: 91 mo
Battery Voltage: 3 V
Brady Statistic AP VP Percent: 96.47 %
Brady Statistic AP VS Percent: 0.02 %
Brady Statistic AS VP Percent: 3.46 %
Brady Statistic AS VS Percent: 0.04 %
Brady Statistic RA Percent Paced: 96.52 %
Brady Statistic RV Percent Paced: 99.93 %
Date Time Interrogation Session: 20210930164048
Implantable Lead Implant Date: 20170922
Implantable Lead Implant Date: 20170922
Implantable Lead Implant Date: 20201229
Implantable Lead Location: 753858
Implantable Lead Location: 753859
Implantable Lead Location: 753860
Implantable Lead Model: 4598
Implantable Lead Model: 5076
Implantable Lead Model: 5076
Implantable Pulse Generator Implant Date: 20201229
Lead Channel Impedance Value: 1938 Ohm
Lead Channel Impedance Value: 304 Ohm
Lead Channel Impedance Value: 380 Ohm
Lead Channel Impedance Value: 437 Ohm
Lead Channel Impedance Value: 437 Ohm
Lead Channel Impedance Value: 456 Ohm
Lead Channel Impedance Value: 494 Ohm
Lead Channel Impedance Value: 513 Ohm
Lead Channel Impedance Value: 513 Ohm
Lead Channel Impedance Value: 741 Ohm
Lead Channel Impedance Value: 741 Ohm
Lead Channel Impedance Value: 817 Ohm
Lead Channel Impedance Value: 817 Ohm
Lead Channel Impedance Value: 855 Ohm
Lead Channel Pacing Threshold Amplitude: 0.5 V
Lead Channel Pacing Threshold Amplitude: 0.75 V
Lead Channel Pacing Threshold Amplitude: 2.125 V
Lead Channel Pacing Threshold Pulse Width: 0.4 ms
Lead Channel Pacing Threshold Pulse Width: 0.4 ms
Lead Channel Pacing Threshold Pulse Width: 1 ms
Lead Channel Sensing Intrinsic Amplitude: 12.625 mV
Lead Channel Sensing Intrinsic Amplitude: 12.625 mV
Lead Channel Sensing Intrinsic Amplitude: 2.25 mV
Lead Channel Sensing Intrinsic Amplitude: 2.25 mV
Lead Channel Setting Pacing Amplitude: 2 V
Lead Channel Setting Pacing Amplitude: 2.5 V
Lead Channel Setting Pacing Amplitude: 2.5 V
Lead Channel Setting Pacing Pulse Width: 0.4 ms
Lead Channel Setting Pacing Pulse Width: 1 ms
Lead Channel Setting Sensing Sensitivity: 4 mV

## 2019-11-14 NOTE — Progress Notes (Signed)
Remote pacemaker transmission.   

## 2019-11-23 IMAGING — CT CT CHEST WITHOUT CONTRAST
2 of 4 series · 11 of 36 positions shown, 13 images · non-contrast
Comparison: Radiograph March 27, 2018. CT scan [DATE]

CLINICAL DATA: Solitary pulmonary nodule. History of Hodgkin's
lymphoma.

EXAM:
CT CHEST WITHOUT CONTRAST
TECHNIQUE: Multidetector CT imaging of the chest was performed following the
standard protocol without IV contrast.

[Series 2: chest 2.00 br40 s3 ax · axial · 0.57mm/px · z∈[+1476,+1784]mm · 8 of 182 slices shown, 10 images]
[im 14/182  mediastinal]
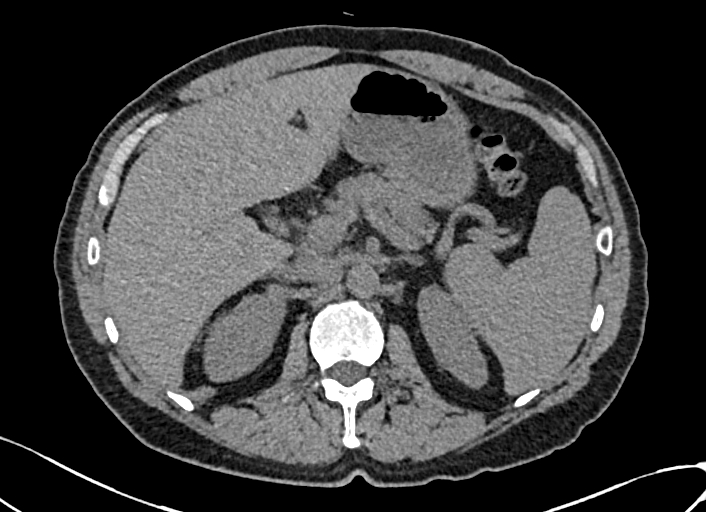
[im 14/182  lung]
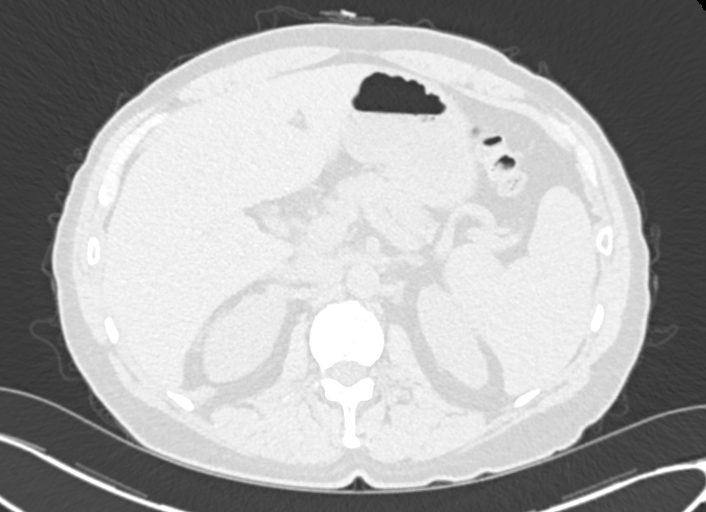
[im 42/182  lung]
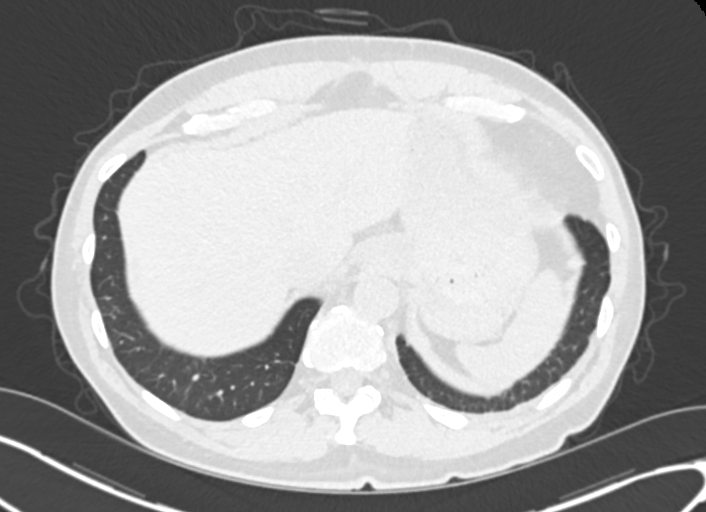
[im 56/182  lung]
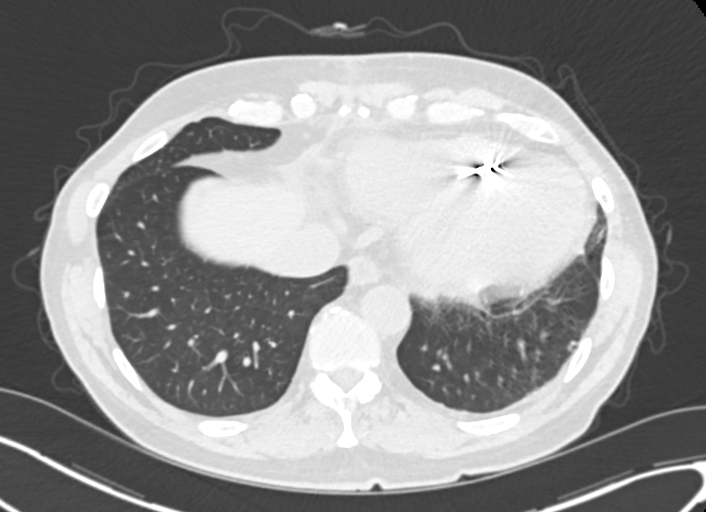
[im 84/182  lung]
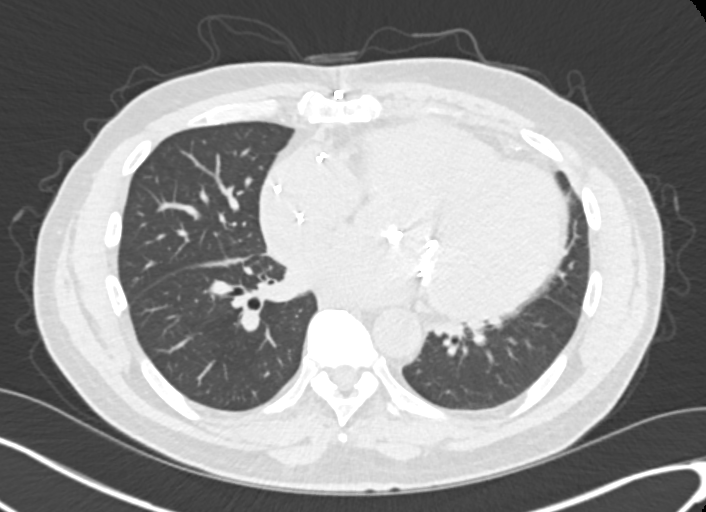
[im 98/182  mediastinal]
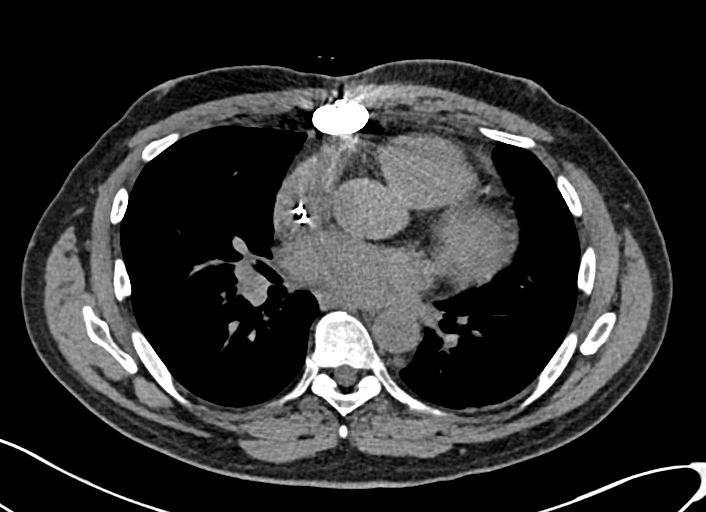
[im 98/182  lung]
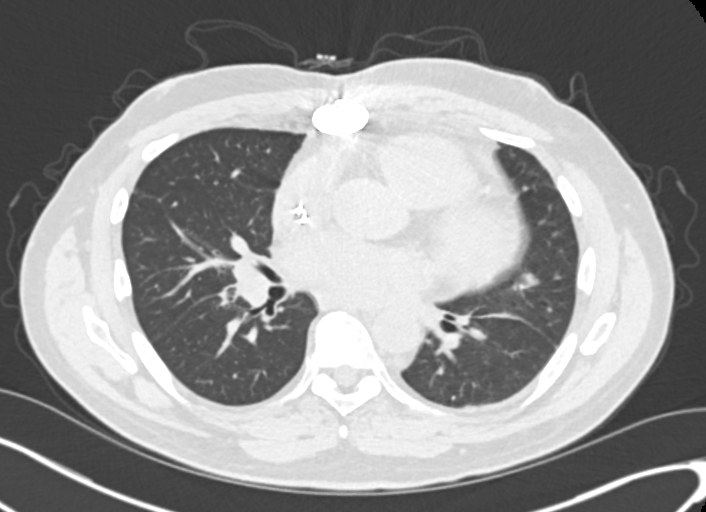
[im 126/182  lung]
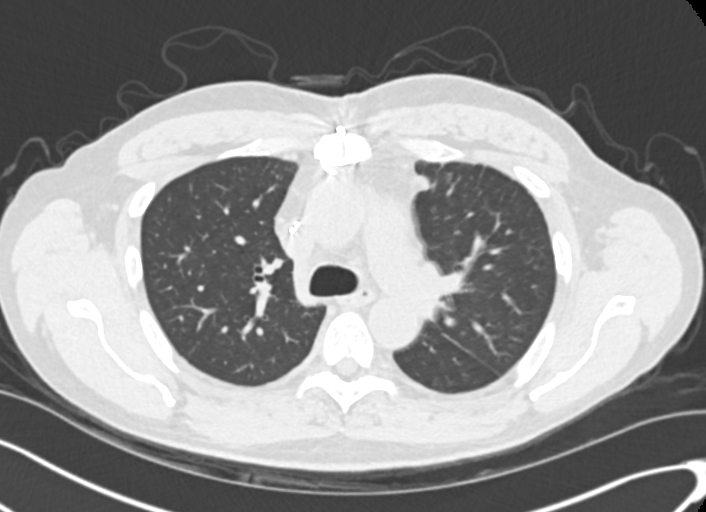
[im 140/182  lung]
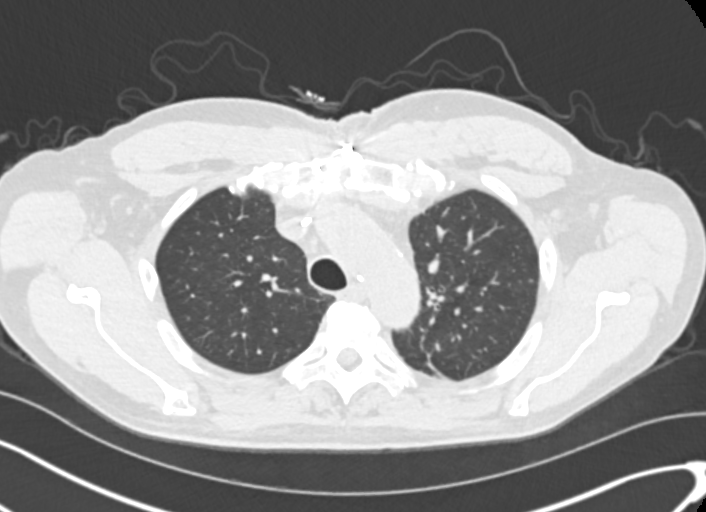
[im 168/182  lung]
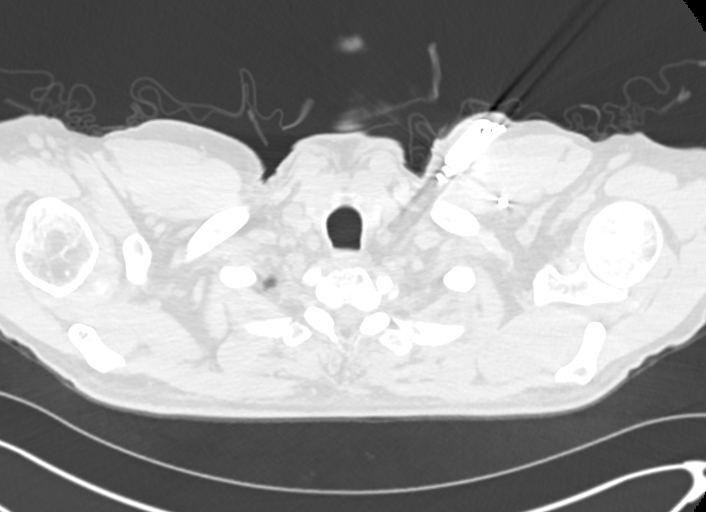

[Series 4: chest 2.00 br40 s3 cor · coronal · 0.71mm/px · 3 of 145 slices shown]
[im 29/145  lung]
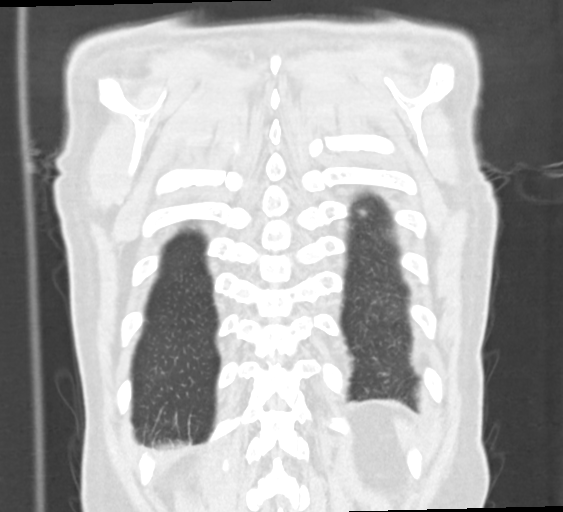
[im 58/145  lung]
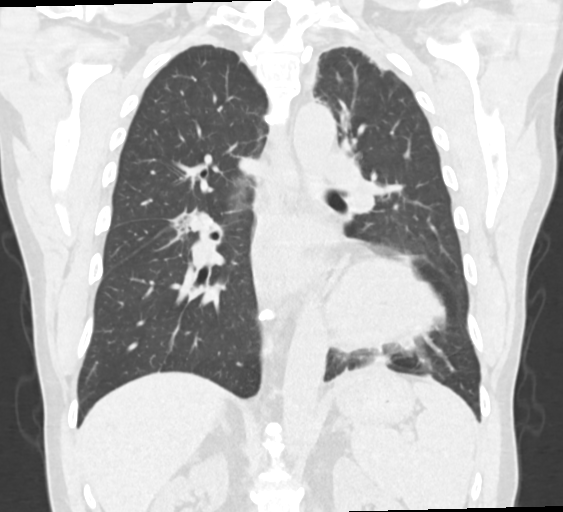
[im 87/145  lung]
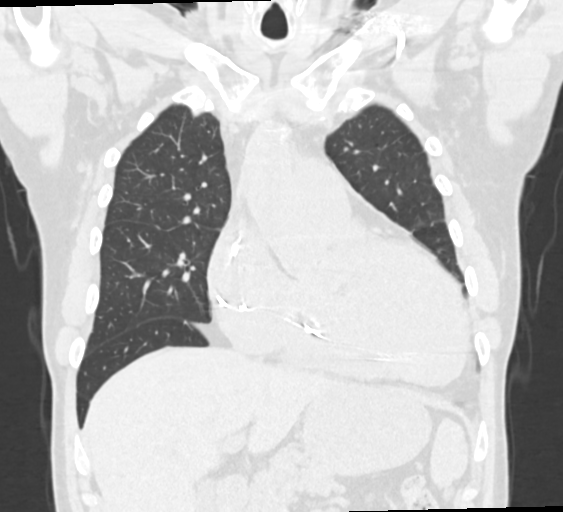

[11 of 36 positions shown; findings below may reference images not displayed]

FINDINGS: Cardiovascular: Mild cardiomegaly is noted. Status post coronary
artery bypass graft. Status post mitral valve repair. Left-sided
pacemaker is noted. Atherosclerosis of thoracic aorta is noted
without aneurysm or dissection.

Mediastinum/Nodes: No enlarged mediastinal or axillary lymph nodes.
Thyroid gland, trachea, and esophagus demonstrate no significant
findings.

Lungs/Pleura: No pneumothorax or pleural effusion is noted. Grossly
stable 20 x 8 mm density is noted medially in the left lung apex
most consistent with scarring. Other interstitial densities most
consistent with scarring are noted in the left lung apex. Right lung
is unremarkable.

Upper Abdomen: No acute abnormality.

Musculoskeletal: No chest wall mass or suspicious bone lesions
identified.
IMPRESSION: Grossly stable 20 x 8 mm density is noted medially in the left lung
apex most consistent with scarring given lack of change compared to
prior exam. No further follow-up required.

Status post coronary bypass graft and mitral valve repair. Mild
cardiomegaly.

Aortic Atherosclerosis ([id number]).

## 2019-11-25 ENCOUNTER — Other Ambulatory Visit: Payer: Self-pay

## 2019-11-25 ENCOUNTER — Ambulatory Visit (HOSPITAL_COMMUNITY): Payer: 59 | Attending: Cardiology

## 2019-11-25 DIAGNOSIS — I5022 Chronic systolic (congestive) heart failure: Secondary | ICD-10-CM | POA: Diagnosis not present

## 2019-11-25 LAB — ECHOCARDIOGRAM COMPLETE
Area-P 1/2: 1.63 cm2
S' Lateral: 4.9 cm

## 2019-11-25 MED ORDER — PERFLUTREN LIPID MICROSPHERE
1.0000 mL | INTRAVENOUS | Status: AC | PRN
  Administered 2019-11-25: 1.5 mL via INTRAVENOUS

## 2020-01-16 ENCOUNTER — Other Ambulatory Visit: Payer: Self-pay | Admitting: Internal Medicine

## 2020-01-16 MED FILL — CARVEDILOL 6.25 MG TABLET: 6.25 | 90 days supply | Qty: 180 | Fill #3

## 2020-01-16 MED FILL — ENTRESTO 24 MG-26 MG TABLET: 24-26 | 90 days supply | Qty: 180 | Fill #3

## 2020-01-18 MED FILL — SPIRONOLACTONE 25 MG TABS: 25 | 90 days supply | Qty: 45 | Fill #3

## 2020-01-20 ENCOUNTER — Encounter: Payer: Self-pay | Admitting: Internal Medicine

## 2020-01-20 ENCOUNTER — Other Ambulatory Visit: Payer: Self-pay

## 2020-01-20 ENCOUNTER — Ambulatory Visit (INDEPENDENT_AMBULATORY_CARE_PROVIDER_SITE_OTHER): Payer: 59 | Admitting: Internal Medicine

## 2020-01-20 VITALS — BP 110/62 | HR 78 | Ht 71.0 in | Wt 193.6 lb

## 2020-01-20 DIAGNOSIS — I428 Other cardiomyopathies: Secondary | ICD-10-CM | POA: Diagnosis not present

## 2020-01-20 DIAGNOSIS — Z953 Presence of xenogenic heart valve: Secondary | ICD-10-CM

## 2020-01-20 DIAGNOSIS — I5022 Chronic systolic (congestive) heart failure: Secondary | ICD-10-CM | POA: Diagnosis not present

## 2020-01-20 DIAGNOSIS — I4819 Other persistent atrial fibrillation: Secondary | ICD-10-CM | POA: Diagnosis not present

## 2020-01-20 DIAGNOSIS — I10 Essential (primary) hypertension: Secondary | ICD-10-CM

## 2020-01-20 DIAGNOSIS — I442 Atrioventricular block, complete: Secondary | ICD-10-CM | POA: Diagnosis not present

## 2020-01-20 LAB — CUP PACEART INCLINIC DEVICE CHECK
Battery Remaining Longevity: 91 mo
Battery Voltage: 2.99 V
Brady Statistic AP VP Percent: 96.57 %
Brady Statistic AP VS Percent: 0.02 %
Brady Statistic AS VP Percent: 3.35 %
Brady Statistic AS VS Percent: 0.06 %
Brady Statistic RA Percent Paced: 96.62 %
Brady Statistic RV Percent Paced: 99.92 %
Date Time Interrogation Session: 20211210160300
Implantable Lead Implant Date: 20170922
Implantable Lead Implant Date: 20170922
Implantable Lead Implant Date: 20201229
Implantable Lead Location: 753858
Implantable Lead Location: 753859
Implantable Lead Location: 753860
Implantable Lead Model: 4598
Implantable Lead Model: 5076
Implantable Lead Model: 5076
Implantable Pulse Generator Implant Date: 20201229
Lead Channel Impedance Value: 2052 Ohm
Lead Channel Impedance Value: 323 Ohm
Lead Channel Impedance Value: 418 Ohm
Lead Channel Impedance Value: 418 Ohm
Lead Channel Impedance Value: 456 Ohm
Lead Channel Impedance Value: 513 Ohm
Lead Channel Impedance Value: 551 Ohm
Lead Channel Impedance Value: 551 Ohm
Lead Channel Impedance Value: 589 Ohm
Lead Channel Impedance Value: 760 Ohm
Lead Channel Impedance Value: 779 Ohm
Lead Channel Impedance Value: 874 Ohm
Lead Channel Impedance Value: 912 Ohm
Lead Channel Impedance Value: 931 Ohm
Lead Channel Pacing Threshold Amplitude: 0.5 V
Lead Channel Pacing Threshold Amplitude: 0.75 V
Lead Channel Pacing Threshold Amplitude: 2.125 V
Lead Channel Pacing Threshold Pulse Width: 0.4 ms
Lead Channel Pacing Threshold Pulse Width: 0.4 ms
Lead Channel Pacing Threshold Pulse Width: 1 ms
Lead Channel Sensing Intrinsic Amplitude: 12.625 mV
Lead Channel Sensing Intrinsic Amplitude: 3.75 mV
Lead Channel Setting Pacing Amplitude: 2 V
Lead Channel Setting Pacing Amplitude: 2.5 V
Lead Channel Setting Pacing Amplitude: 2.5 V
Lead Channel Setting Pacing Pulse Width: 0.4 ms
Lead Channel Setting Pacing Pulse Width: 1 ms
Lead Channel Setting Sensing Sensitivity: 4 mV

## 2020-01-20 NOTE — Progress Notes (Signed)
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 male who presents today for routine electrophysiology followup.  Since last being seen in our clinic, the patient reports doing very well.  He has SOB with moderate activity.  Today, he denies symptoms of palpitations, chest pain, shortness of breath at rest,  lower extremity edema, dizziness, presyncope, or syncope.  The patient is otherwise without complaint today.   Past Medical History:  Diagnosis Date  . Abnormal TSH   . Arthritis    ddd  . Atrial fibrillation and flutter ([place])    s/p DCCV 03/26/2018  . CAD in native artery    a. 11/2015: s/p 2 vessel PCI of the first diagonal branch of the LAD in the mid left circumflex with DES. // s/p CABG 01/2018  . Chronic combined systolic and diastolic CHF (congestive heart failure) ([place])    sees Dr. Rayann Heman   . Complete heart block (H[facility])    a. s/p Medtronic ppm 10/2015.  Marland Kitchen History of radiation therapy 2002  . Hodgkin disease ([place]) 2002   a. s/p chest radiation and chemotherapy  . Hypothyroidism    sees Dr. Cruzita Lederer   . Incidental pulmonary nodule 01/19/2018   Left apical opacity - possible scarring  . Mitral regurgitation    s/p bioprosthetic MV replacement 01/2018  . Pacemaker 10/2015   Medtronic  . PVC's (premature ventricular contractions)   . Right bundle branch block   . 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 S/P mitral valve replacement with bioprosthetic valve 01/27/2018   29 mm [facility] Mitral stented bovine pericardial tissue valve   Past Surgical History:  Procedure Laterality Date  . BIV UPGRADE N/A 02/08/2019   Procedure: UPGRADE TO BIV PPM;  Surgeon: [name], MD;  Location: [place] CV [facility];  Service: Cardiovascular;  Laterality: N/A;  . CARDIAC CATHETERIZATION N/A 11/02/2015   Procedure: Left Heart Cath and Coronary Angiography;  Surgeon: Sherren Mocha, MD;   Location: [place] [facility];  Service: Cardiovascular;  Laterality: N/A;  . CARDIAC CATHETERIZATION N/A 11/21/2015   Procedure: Coronary Stent Intervention;  Surgeon: Sherren Mocha, MD;  Location: [place] [facility];  Service: Cardiovascular;  Laterality: N/A;  . CARDIAC CATHETERIZATION  11/2017  . CARDIOVERSION N/A 03/26/2018   Procedure: CARDIOVERSION;  Surgeon: [name], MD;  Location: [facility] ENDOSCOPY;  Service: Cardiovascular;  Laterality: N/A;  . COLONOSCOPY  01/16/2015   per Dr. Ardis Hughs, benign polyps, repeat in 10 yrs   . CORONARY ARTERY BYPASS GRAFT N/A 01/27/2018   Procedure: CORONARY ARTERY BYPASS GRAFTING (CABG) x two, using left internal mammary artery and right leg greater saphenous vein harvested endoscopically;  Surgeon: Rexene Alberts, MD;  Location: [place];  Service: Open Heart Surgery;  Laterality: N/A;  . EP IMPLANTABLE DEVICE N/A 11/02/2015   MDT Adivisa MRI conditional dual chamber pacemaker implanted by Dr Rayann Heman for complete heart block  . KNEE SURGERY Right 1972  . LUMBAR LAMINECTOMY  1996  . MITRAL VALVE REPLACEMENT N/A 01/27/2018   Procedure: MITRAL VALVE (MV) REPLACEMENT using Magna Mitral Ease Valve Size 29MM;  Surgeon: Rexene Alberts, MD;  Location: [place];  Service: Open Heart Surgery;  Laterality: N/A;  . RIGHT/LEFT HEART CATH AND CORONARY ANGIOGRAPHY N/A 12/02/2017   Procedure: RIGHT/LEFT HEART CATH AND CORONARY ANGIOGRAPHY;  Surgeon: Jettie Booze, MD;  Location: [place] [facility] LAB;  Service: Cardiovascular;  Laterality: N/A;  . TEE WITHOUT CARDIOVERSION N/A 12/09/2017   Procedure: TRANSESOPHAGEAL ECHOCARDIOGRAM (TEE);  Surgeon: Buford Dresser, MD;  Location: [facility] ENDOSCOPY;  Service: Cardiovascular;  Laterality: N/A;  . TEE WITHOUT CARDIOVERSION N/A 01/27/2018   Procedure: TRANSESOPHAGEAL ECHOCARDIOGRAM (TEE);  Surgeon: Rexene Alberts, MD;  Location: [place];  Service: Open Heart Surgery;  Laterality: N/A;    ROS- all systems are reviewed  and negative except as per HPI above  Current Outpatient Medications  Medication Sig Dispense Refill  . amoxicillin (AMOXIL) 500 MG capsule SMARTSIG:4 Capsule(s) By Mouth    . aspirin EC 81 MG tablet Take 81 mg by mouth daily.    Marland Kitchen atorvastatin (LIPITOR) 40 MG tablet Take 1 tablet (40 mg total) by mouth daily. 90 tablet 3  . carvedilol (COREG) 6.25 MG tablet TAKE 1 TABLET BY MOUTH 2 TIMES DAILY WITH A MEAL 180 tablet 3  . levothyroxine (SYNTHROID) 75 MCG tablet Take 1 tablet (75 mcg total) by mouth daily before breakfast. 90 tablet 3  . Multiple Vitamin (MULTIVITAMIN WITH MINERALS) TABS tablet Take 1 tablet by mouth daily.    . sacubitril-valsartan (ENTRESTO) 24-26 MG Take 1 tablet by mouth 2 (two) times daily. 180 tablet 3  . sildenafil (VIAGRA) 100 MG tablet Take 1 tablet (100 mg total) by mouth daily as needed for erectile dysfunction. 30 tablet 3  . spironolactone (ALDACTONE) 25 MG tablet Take 0.5 tablets (12.5 mg total) by mouth daily. 45 tablet 3  . traZODone (DESYREL) 50 MG tablet Take 1 tablet (50 mg total) by mouth at bedtime. 90 tablet 3   No current facility-administered medications for this visit.    Physical Exam: Vitals:   01/20/20 1541  BP: 110/62  Pulse: 78  SpO2: 98%  Weight: 193 lb 9.6 oz (87.8 kg)  Height: 5\' 11"  (1.803 m)    GEN- The patient is well appearing, alert and oriented x 3 today.   Head- normocephalic, atraumatic Eyes-  Sclera clear, conjunctiva pink Ears- hearing intact Oropharynx- clear Lungs- Clear to ausculation bilaterally, normal work of breathing Chest- pacemaker pocket is well healed Heart- Regular rate and rhythm, no murmurs, rubs or gallops, PMI not laterally displaced GI- soft, NT, ND, + BS Extremities- no clubbing, cyanosis, or edema  Pacemaker interrogation- reviewed in detail today,  See PACEART report  ekg tracing ordered today is personally reviewed and shows AV paced  Assessment and Plan:  1. Symptomatic complete heart  block Normal pacemaker function See Pace Art report VV timing changed from Adaptive CRT to LV prior to RV by 40 msec he is device dependant today  2. Chronic systolic dysfunction Device reprogrammed as above I would not advise upgrade to an ICD given data showing mortality benefit with CRT alone. VV timing adjusted as above Consider AV optimization if not improved with this change  3. S/p MVR with CABG Stable No change required today  4. Persistent afib 0% afib by device interrogation On warfarin  Risks, benefits and potential toxicities for medications prescribed and/or refilled reviewed with patient today.   Return to see EP NP for further optimization consideration in 4 months  [name] MD, [facility] 01/20/2020 3:51 PM

## 2020-01-20 NOTE — Patient Instructions (Addendum)
Medication Instructions:  Your physician recommends that you continue on your current medications as directed. Please refer to the Current Medication list given to you today.  *If you need a refill on your cardiac medications before your next appointment, please call your pharmacy*  Lab Work: None ordered.  If you have labs (blood work) drawn today and your tests are completely normal, you will receive your results only by: Marland Kitchen MyChart Message (if you have MyChart) OR . A paper copy in the mail If you have any lab test that is abnormal or we need to change your treatment, we will call you to review the results.  Testing/Procedures: None ordered.  Follow-Up: At [facility] [address], you and your health needs are our priority.  As part of our continuing mission to provide you with exceptional heart care, we have created designated Provider Care Teams.  These Care Teams include your primary Cardiologist (physician) and Advanced Practice Providers (APPs -  Physician Assistants and Nurse Practitioners) who all work together to provide you with the care you need, when you need it.  We recommend signing up for the patient portal called "MyChart".  Sign up information is provided on this After Visit Summary.  MyChart is used to connect with patients for Virtual Visits (Telemedicine).  Patients are able to view lab/test results, encounter notes, upcoming appointments, etc.  Non-urgent messages can be sent to your provider as well.   To learn more about what you can do with MyChart, go to [email or web address].    Your next appointment:   Your physician wants you to follow-up in: 4 months with Amber.  You will receive a reminder letter in the mail two months in advance. If you don't receive a letter, please call our office to schedule the follow-up appointment.  Remote monitoring is used to monitor your Pacemaker from home. This monitoring reduces the number of office visits required to check your device to  one time per year. It allows [place] to keep an eye on the functioning of your device to ensure it is working properly. You are scheduled for a device check from home on 02/09/20. You may send your transmission at any time that day. If you have a wireless device, the transmission will be sent automatically. After your physician reviews your transmission, you will receive a postcard with your next transmission date.  Other Instructions:

## 2020-02-01 MED FILL — traZODone HCL 50 MG TABS: 50 | 90 days supply | Qty: 90 | Fill #2

## 2020-02-01 MED FILL — LEVOTHYROXINE SODIUM 75 MCG: 75 | 90 days supply | Qty: 90 | Fill #3

## 2020-02-02 ENCOUNTER — Other Ambulatory Visit: Payer: Self-pay

## 2020-02-02 MED ORDER — ATORVASTATIN CALCIUM 40 MG PO TABS: 40.0000 mg | ORAL_TABLET | Freq: Every day | ORAL | 3 refills | Status: DC

## 2020-02-02 MED FILL — ATORVASTATIN 40 MG TABLET: 40 | 90 days supply | Qty: 90 | Fill #0

## 2020-02-09 ENCOUNTER — Ambulatory Visit (INDEPENDENT_AMBULATORY_CARE_PROVIDER_SITE_OTHER): Payer: 59

## 2020-02-09 DIAGNOSIS — I442 Atrioventricular block, complete: Secondary | ICD-10-CM

## 2020-02-09 LAB — CUP PACEART REMOTE DEVICE CHECK
Battery Remaining Longevity: 86 mo
Battery Voltage: 2.99 V
Brady Statistic AP VP Percent: 96.18 %
Brady Statistic AP VS Percent: 0.01 %
Brady Statistic AS VP Percent: 3.69 %
Brady Statistic AS VS Percent: 0.11 %
Brady Statistic RA Percent Paced: 96.26 %
Brady Statistic RV Percent Paced: 99.88 %
Date Time Interrogation Session: 20211230031513
Implantable Lead Implant Date: 20170922
Implantable Lead Implant Date: 20170922
Implantable Lead Implant Date: 20201229
Implantable Lead Location: 753858
Implantable Lead Location: 753859
Implantable Lead Location: 753860
Implantable Lead Model: 4598
Implantable Lead Model: 5076
Implantable Lead Model: 5076
Implantable Pulse Generator Implant Date: 20201229
Lead Channel Impedance Value: 2071 Ohm
Lead Channel Impedance Value: 323 Ohm
Lead Channel Impedance Value: 380 Ohm
Lead Channel Impedance Value: 437 Ohm
Lead Channel Impedance Value: 456 Ohm
Lead Channel Impedance Value: 475 Ohm
Lead Channel Impedance Value: 494 Ohm
Lead Channel Impedance Value: 513 Ohm
Lead Channel Impedance Value: 570 Ohm
Lead Channel Impedance Value: 760 Ohm
Lead Channel Impedance Value: 798 Ohm
Lead Channel Impedance Value: 874 Ohm
Lead Channel Impedance Value: 874 Ohm
Lead Channel Impedance Value: 950 Ohm
Lead Channel Pacing Threshold Amplitude: 0.375 V
Lead Channel Pacing Threshold Amplitude: 0.75 V
Lead Channel Pacing Threshold Amplitude: 2.125 V
Lead Channel Pacing Threshold Pulse Width: 0.4 ms
Lead Channel Pacing Threshold Pulse Width: 0.4 ms
Lead Channel Pacing Threshold Pulse Width: 1 ms
Lead Channel Sensing Intrinsic Amplitude: 12.625 mV
Lead Channel Sensing Intrinsic Amplitude: 12.625 mV
Lead Channel Sensing Intrinsic Amplitude: 2.125 mV
Lead Channel Sensing Intrinsic Amplitude: 2.125 mV
Lead Channel Setting Pacing Amplitude: 2 V
Lead Channel Setting Pacing Amplitude: 2.5 V
Lead Channel Setting Pacing Amplitude: 2.5 V
Lead Channel Setting Pacing Pulse Width: 0.4 ms
Lead Channel Setting Pacing Pulse Width: 1 ms
Lead Channel Setting Sensing Sensitivity: 4 mV

## 2020-02-24 NOTE — Progress Notes (Signed)
Remote pacemaker transmission.   

## 2020-04-06 ENCOUNTER — Other Ambulatory Visit: Payer: Self-pay | Admitting: Internal Medicine

## 2020-04-06 MED FILL — ENTRESTO 24 MG-26 MG TABLET: 24-26 | 90 days supply | Qty: 180 | Fill #0

## 2020-04-25 ENCOUNTER — Other Ambulatory Visit: Payer: Self-pay | Admitting: Internal Medicine

## 2020-05-10 ENCOUNTER — Ambulatory Visit (INDEPENDENT_AMBULATORY_CARE_PROVIDER_SITE_OTHER): Payer: 59

## 2020-05-10 DIAGNOSIS — I428 Other cardiomyopathies: Secondary | ICD-10-CM

## 2020-05-10 LAB — CUP PACEART REMOTE DEVICE CHECK
Battery Remaining Longevity: 81 mo
Battery Voltage: 2.98 V
Brady Statistic AP VP Percent: 95.66 %
Brady Statistic AP VS Percent: 0.02 %
Brady Statistic AS VP Percent: 4.06 %
Brady Statistic AS VS Percent: 0.26 %
Brady Statistic RA Percent Paced: 95.83 %
Brady Statistic RV Percent Paced: 99.72 %
Date Time Interrogation Session: 20220331040621
Implantable Lead Implant Date: 20170922
Implantable Lead Implant Date: 20170922
Implantable Lead Implant Date: 20201229
Implantable Lead Location: 753858
Implantable Lead Location: 753859
Implantable Lead Location: 753860
Implantable Lead Model: 4598
Implantable Lead Model: 5076
Implantable Lead Model: 5076
Implantable Pulse Generator Implant Date: 20201229
Lead Channel Impedance Value: 2185 Ohm
Lead Channel Impedance Value: 304 Ohm
Lead Channel Impedance Value: 399 Ohm
Lead Channel Impedance Value: 437 Ohm
Lead Channel Impedance Value: 437 Ohm
Lead Channel Impedance Value: 456 Ohm
Lead Channel Impedance Value: 494 Ohm
Lead Channel Impedance Value: 532 Ohm
Lead Channel Impedance Value: 532 Ohm
Lead Channel Impedance Value: 760 Ohm
Lead Channel Impedance Value: 760 Ohm
Lead Channel Impedance Value: 836 Ohm
Lead Channel Impedance Value: 855 Ohm
Lead Channel Impedance Value: 874 Ohm
Lead Channel Pacing Threshold Amplitude: 0.5 V
Lead Channel Pacing Threshold Amplitude: 0.75 V
Lead Channel Pacing Threshold Amplitude: 2.125 V
Lead Channel Pacing Threshold Pulse Width: 0.4 ms
Lead Channel Pacing Threshold Pulse Width: 0.4 ms
Lead Channel Pacing Threshold Pulse Width: 1 ms
Lead Channel Sensing Intrinsic Amplitude: 12.625 mV
Lead Channel Sensing Intrinsic Amplitude: 12.625 mV
Lead Channel Sensing Intrinsic Amplitude: 2.625 mV
Lead Channel Sensing Intrinsic Amplitude: 2.625 mV
Lead Channel Setting Pacing Amplitude: 2 V
Lead Channel Setting Pacing Amplitude: 2.5 V
Lead Channel Setting Pacing Amplitude: 2.5 V
Lead Channel Setting Pacing Pulse Width: 0.4 ms
Lead Channel Setting Pacing Pulse Width: 1 ms
Lead Channel Setting Sensing Sensitivity: 4 mV

## 2020-05-14 DIAGNOSIS — Z950 Presence of cardiac pacemaker: Secondary | ICD-10-CM

## 2020-05-14 DIAGNOSIS — I442 Atrioventricular block, complete: Secondary | ICD-10-CM

## 2020-05-14 DIAGNOSIS — I428 Other cardiomyopathies: Secondary | ICD-10-CM

## 2020-05-14 DIAGNOSIS — I5022 Chronic systolic (congestive) heart failure: Secondary | ICD-10-CM

## 2020-05-14 DIAGNOSIS — I4819 Other persistent atrial fibrillation: Secondary | ICD-10-CM

## 2020-05-16 DIAGNOSIS — Z950 Presence of cardiac pacemaker: Secondary | ICD-10-CM | POA: Diagnosis not present

## 2020-05-16 DIAGNOSIS — I4819 Other persistent atrial fibrillation: Secondary | ICD-10-CM | POA: Diagnosis not present

## 2020-05-16 DIAGNOSIS — I5022 Chronic systolic (congestive) heart failure: Secondary | ICD-10-CM | POA: Diagnosis not present

## 2020-05-16 DIAGNOSIS — I428 Other cardiomyopathies: Secondary | ICD-10-CM | POA: Diagnosis not present

## 2020-05-16 DIAGNOSIS — I442 Atrioventricular block, complete: Secondary | ICD-10-CM | POA: Diagnosis not present

## 2020-05-17 ENCOUNTER — Other Ambulatory Visit: Payer: Self-pay

## 2020-05-17 ENCOUNTER — Ambulatory Visit (INDEPENDENT_AMBULATORY_CARE_PROVIDER_SITE_OTHER): Payer: 59 | Admitting: Internal Medicine

## 2020-05-17 VITALS — BP 106/66 | HR 61 | Ht 71.0 in | Wt 192.0 lb

## 2020-05-17 DIAGNOSIS — I428 Other cardiomyopathies: Secondary | ICD-10-CM | POA: Diagnosis not present

## 2020-05-17 DIAGNOSIS — Z950 Presence of cardiac pacemaker: Secondary | ICD-10-CM | POA: Diagnosis not present

## 2020-05-17 DIAGNOSIS — I2510 Atherosclerotic heart disease of native coronary artery without angina pectoris: Secondary | ICD-10-CM | POA: Diagnosis not present

## 2020-05-17 DIAGNOSIS — Z952 Presence of prosthetic heart valve: Secondary | ICD-10-CM | POA: Diagnosis not present

## 2020-05-17 DIAGNOSIS — Z951 Presence of aortocoronary bypass graft: Secondary | ICD-10-CM | POA: Diagnosis not present

## 2020-05-17 LAB — LIPID PANEL
Chol/HDL Ratio: 3.4 ratio (ref 0.0–5.0)
Cholesterol, Total: 109 mg/dL (ref 100–199)
HDL: 32 mg/dL — ABNORMAL LOW (ref >39)
LDL Chol Calc (NIH): 57 mg/dL (ref 0–99)
Triglycerides: 104 mg/dL (ref 0–149)
VLDL Cholesterol Cal: 20 mg/dL (ref 5–40)

## 2020-05-17 NOTE — Progress Notes (Signed)
OFFICE NOTE  Chief Complaint:  No complaints  Primary Care Physician: Laurey Morale, MD  HPI:  Tyler Hendrix is a pleasant 65 year old male who was previously seen by Dr. Acie Fredrickson in September of this past year. He is an add on to my schedule today for acute symptoms of palpitations. He reports over the past several days she's had skipped heartbeats and higher heart rates. He says his heart rate is typically in the 60s however recently has been in the 80s. He's also felt some skipped beats and they were happening 4-5 times a minute. He denies any chest pain or worsening shortness of breath. He's never had a stress test or any coronary disease evaluation. He does have a history of Hodgkin's lymphoma and had chest wall radiation. In September he had an echocardiogram which showed normal systolic function and it was felt that he was doing fairly well. He continued to do exercise which she has recently without any significant symptoms. 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   Tyler Hendrix returns today for follow-up. Overall he is feeling fairly well. He reports his palpitations have improved somewhat but he still feeling him. While wearing the monitor he was noted to have significant ventricular ectopy including isolated PVCs, and ventricular trigeminy and quadrigeminy. His nuclear stress test was negative for ischemia but was not gated. He did have an echo last fall which showed an EF of 55% which is reassuring.   At the pleasure see Tyler Hendrix back in the office today for follow-up. Overall he thinks he is doing much better on the high-dose of metoprolol 25 mg daily. Although he's tolerating this with very few palpitations, he still has some breakthrough in the afternoon. He generally takes the medicine in the morning. His EKG today however does show a low heart rate of 48, with a short  period to sinus rhythm and a competing junctional rhythm. I've had concerns about underlying conduction system disease based on his EKG showing a right bundle branch block and now there is some evidence of a junctional bradycardia. Despite this he has a good energy level, denies chest pain and is able to maintain normal physical activity. His wife recently had their new baby about 2 weeks ago.  06/22/2015  Tyler Hendrix returns today for follow-up. He reports that he is overall done fairly well. He denies any worsening shortness of breath or fatigue. Heart rate remains in the upper 40s although he has very little ventricular ectopy and his EKG today. He denies any significant palpitations. I think we found a balance currently between low-dose beta blocker and his underlying conduction disease. Should he have more symptoms however he may need antiarrhythmic medication. I do not see any features that are concerning for pacemaker at this point. Blood pressure, however remains elevated and not at goal.  10/04/2015  Tyler Hendrix was seen back today in follow-up. He reports a marked improvement in his PVCs however over the past week he's felt slightly more short of breath with exertion. He did not voluntarily mention this yet required significant persistent questioning on my part to get an answer out of him. He also said that he might have a little pressure in his chest when he tried to exercise. He denied any pain, dizziness, presyncope or syncopal symptoms. Blood pressure is improved somewhat after the  addition of amlodipine. He's no longer having any PVCs however his EKG is abnormal today showing a new complete heart block. Heart rate is 44 with a sinus rhythm and wide QRS. There are inferior T-wave abnormalities which were previously seen on his EKG.  10/05/2015  Tyler Hendrix was seen today in follow-up of complete heart block. This was noted yesterday during his office visit. Heart rate was in the 40s and he was  somewhat diaphoretic. He denied any chest pain or shortness of breath but has felt fatigue recently. He had taken Toprol XL the night before and I advised him to not take any last evening and allow the medicine to wash out of his system. A today he is a heart rate has improved up to 88 and is in sinus rhythm with first-degree AV block and bifascicular block. He thinks he actually feels a little better today although was not diaphoretic. He said he didn't sleep well last night and had some diarrhea this morning. He does not noted change in energy level although he feels that he's had some recurrent palpitations.  01/01/2016  Tyler Hendrix returns today for follow-up. He underwent placement of a pacemaker which was uneventful for complete heart block. He says since that time his color is improved energy is better. Heart rate today is 61 however the pacer is set at a backup rate of 50. He was concerned that this may be still causing a little fatigue but I reassured him that I think that his heart rate is over that number and unlikely to be the cause. Blood pressure is well-controlled. The pacemaker site looks appropriate without any hematoma or effusion. He was recently seen by Almyra Deforest, PA-C , and was noted to have an elevated TSH. This is been elevated in the past but was as high as 9. Free T4 was ordered and those labs are pending. He did have radiation to his thyroid as part of treatment for Hodgkin's lymphoma.  04/18/2015  Tyler Hendrix was seen today in follow-up. Overall he seems to be doing very well. After discharge his amlodipine was increased to 10 mg from 5 mg. I rechecked his blood pressure today was 104/48, and he has noted that occasionally he gets a flushed feeling when exercising. I suspect he may be on too much medicine. We did discuss that amlodipine although does have a benefit for controlling blood pressure, has no intrinsic cardiovascular benefit. He does have normal renal function would probably  benefit from being on an ACE or ARB. He denies any chest pain or worsening shortness of breath. No problems with his pacemaker. He has been seeing an endocrinologist and his thyroid function has normalized on low-dose levothyroxine.  10/17/2016  Tyler Hendrix returns today for follow-up. He continues to do well. Recently he had his valsartan and switch to [place] losartan. He was feeling a little fatigue any decrease the dose from 20 mg to 10 mg daily. Blood pressure he said was running low for him. Today seems to be normal at 112/68. He is going to see his primary care provider about retesting of his testosterone. Apparently in the past this is been low as a result of chemotherapy and radiation. 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 He denies any chest pain. He's had no presyncope or syncopal symptoms. His pacemaker is been functioning appropriately on remote checks. This now almost one year since his two-vessel PCI.  04/24/2017  Tyler Hendrix returns today for follow-up.  Overall he is doing well.  He denies any chest pain or worsening shortness of breath.  Blood pressure was elevated today however came down to 128/56.  He remains a heart rate of 50 which is his backup pacer setting.  He is in complete heart block and testing is indicated that he is pacemaker dependent.  He does do remote checks.  He had two-vessel coronary artery disease and has been asymptomatic with that.  He is on aspirin and high-dose atorvastatin.  He has a physical exam coming up in a few weeks with his primary care provider who will get cholesterol testing at that time.  He is also on olmesartan for blood pressure.  04/12/2018  Tyler Hendrix is seen today in follow-up.  He had a very eventful last several months.  As above, he has a history of heart block with permanent pacemaker placement in 2017, CAD status post multivessel PCI in 2017, chronic combined  systolic and diastolic heart failure, status post bioprosthetic MVR and CABG x2 in December 2019, and new onset atrial fibrillation status post cardioversion on 03/26/2018.  He again presented recently with worsening shortness of breath and tachycardia and elective cardioversion was recommended.  In addition he was found to be in significant heart failure and was heavily diuresed.  Since discharge his weight has been down although he did gain back a few pounds.  Despite this, he has no evidence of any heart failure today.  In fact recently he has been getting lightheaded and dizzy and it was advised that he discontinue Lasix and potassium.  He is on Entresto.  His LVEF had decreased to 25% from the mid 40% range.  He is also on warfarin which was started initially for his mitral valve surgery, but has been continued because of A. fib.  There is a question of whether he has valvular A. Fib.  03/20/2019  Tyler Hendrix returns today for follow-up.  He reports feeling much better.  He underwent biventricular upgrade of his device in December and since then has been doing better with improved energy and less heart failure symptoms.  Blood pressure today is well controlled 130/71.  He reports his weight is stable.  We had discussed uptitrating his Delene Loll however when I suggested we may consider that today he did not want to change his medicines.  His INR has been therapeutic on warfarin.  We discussed whether he possibly could come off of this since his A. fib was post CABG.  He also is being monitored with his device.  I recommended deferring this decision to Dr.Allred who is the A. fib expert when he follows up with him in April.  In addition we discussed a follow-up echo which should occur prior to that visit.  10/13/2019  Tyler Hendrix is seen today in follow-up.  Overall he seems to be doing well although reports some fatiguing with exercise.  He says he has been seen by Dr. Rayann Hendrix in just last week by Chanetta Marshall,  NP for reevaluation of his biventricular pacer.  He noted that his heart rate is not increasing significantly with exertion.  Apparently the device could have a setting enabled which would allow more of that.  He was not sure why that was not programmed initially.  He has not really done a lot of exercise or activity to test his heart rate but is interested to see how he feels with that.  His LVEF  had improved somewhat up to 40% after biventricular upgrade however it was felt that this was too early to recheck it and is due for a repeat echo in October.  Also recently he thought that he was having some side effects including issues with memory or brain fog on a statin.  He had stopped it about a week ago.  He was on 80 mg of atorvastatin however his LDL was well controlled at 50.  05/17/2020  Tyler Hendrix returns today for follow-up.  Overall he says he is doing really well.  He did have repeat lipids recently showing total cholesterol 109, triglycerides 104, HDL 32 and LDL 57.  He is seeing his endocrinologist tomorrow and has an appoint with his PCP next week.  He continues to work but says he may retire within the next year.  Denies any chest pain or worsening shortness of breath.  His remote pacer checks have been stable.  He is in a paced rhythm today.  Blood pressure is on the low normal range.  There is really not room to uptitrate his Entresto.  PMHx:  Past Medical History:  Diagnosis Date  . Abnormal TSH   . Arthritis    ddd  . Atrial fibrillation and flutter ([place])    s/p DCCV 03/26/2018  . CAD in native artery    a. 11/2015: s/p 2 vessel PCI of the first diagonal branch of the LAD in the mid left circumflex with DES. // s/p CABG 01/2018  . Chronic combined systolic and diastolic CHF (congestive heart failure) ([place])    sees Dr. Rayann Hendrix   . Complete heart block (H[facility])    a. s/p Medtronic ppm 10/2015.  Marland Kitchen History of radiation therapy 2002  . Hodgkin disease ([place]) 2002   a. s/p chest radiation and  chemotherapy  . Hypothyroidism    sees Dr. Cruzita Lederer   . Incidental pulmonary nodule 01/19/2018   Left apical opacity - possible scarring  . Mitral regurgitation    s/p bioprosthetic MV replacement 01/2018  . Pacemaker 10/2015   Medtronic  . PVC's (premature ventricular contractions)   . Right bundle branch block   . 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 S/P mitral valve replacement with bioprosthetic valve 01/27/2018   29 mm [facility] Mitral stented bovine pericardial tissue valve    Past Surgical History:  Procedure Laterality Date  . BIV UPGRADE N/A 02/08/2019   Procedure: UPGRADE TO BIV PPM;  Surgeon: [name], MD;  Location: [place] CV LAB;  Service: Cardiovascular;  Laterality: N/A;  . CARDIAC CATHETERIZATION N/A 11/02/2015   Procedure: Left Heart Cath and Coronary Angiography;  Surgeon: Sherren Mocha, MD;  Location: [place] CV LAB;  Service: Cardiovascular;  Laterality: N/A;  . CARDIAC CATHETERIZATION N/A 11/21/2015   Procedure: Coronary Stent Intervention;  Surgeon: Sherren Mocha, MD;  Location: [place] CV LAB;  Service: Cardiovascular;  Laterality: N/A;  . CARDIAC CATHETERIZATION  11/2017  . CARDIOVERSION N/A 03/26/2018   Procedure: CARDIOVERSION;  Surgeon: [name], MD;  Location: [facility] ENDOSCOPY;  Service: Cardiovascular;  Laterality: N/A;  . COLONOSCOPY  01/16/2015   per Dr. Ardis Hughs, benign polyps, repeat in 10 yrs   . CORONARY ARTERY BYPASS GRAFT N/A 01/27/2018   Procedure: CORONARY ARTERY BYPASS GRAFTING (CABG) x two, using left internal mammary artery and right leg greater saphenous vein harvested endoscopically;  Surgeon: Rexene Alberts, MD;  Location: [place];  Service: Open Heart Surgery;  Laterality: N/A;  . EP IMPLANTABLE DEVICE N/A 11/02/2015   MDT Adivisa MRI conditional dual chamber pacemaker implanted by Dr Rayann Hendrix for complete heart block  . KNEE SURGERY Right 1972  . LUMBAR LAMINECTOMY  1996  .  MITRAL VALVE REPLACEMENT N/A 01/27/2018   Procedure: MITRAL VALVE (MV) REPLACEMENT using Magna Mitral Ease Valve Size 29MM;  Surgeon: Rexene Alberts, MD;  Location: [place];  Service: Open Heart Surgery;  Laterality: N/A;  . RIGHT/LEFT HEART CATH AND CORONARY ANGIOGRAPHY N/A 12/02/2017   Procedure: RIGHT/LEFT HEART CATH AND CORONARY ANGIOGRAPHY;  Surgeon: Jettie Booze, MD;  Location: [place] CV LAB;  Service: Cardiovascular;  Laterality: N/A;  . TEE WITHOUT CARDIOVERSION N/A 12/09/2017   Procedure: TRANSESOPHAGEAL ECHOCARDIOGRAM (TEE);  Surgeon: Buford Dresser, MD;  [facility] END[facility]COPY;  Service: Cardiovascular;  Laterality: N/A;  . TEE WITHOUT CARDIOVERSION N/A 01/27/2018   Procedure: TRANSESOPHAGEAL ECHOCARDIOGRAM (TEE);  Surgeon: Rexene Alberts, MD;  Location: [place];  Service: Open Heart Surgery;  Laterality: N/A;    FAMHx:  Family History  Problem Relation Age of Onset  . Cancer Other        prostate father hx  . Heart disease Mother   . Heart disease Father   . Colon cancer Neg Hx     SOCHx:   reports that he has never smoked. He has never used smokeless tobacco. He reports current alcohol use of about 5.0 standard drinks of alcohol per week. He reports that he does not use drugs.  ALLERGIES:  Allergies  Allergen Reactions  . Other     Pt reported allergic to dust mites that causes of sneezing, runny nose    ROS: Pertinent items noted in HPI and remainder of comprehensive ROS otherwise negative.  HOME MEDS: Current Outpatient Medications  Medication Sig Dispense Refill  . amoxicillin (AMOXIL) 500 MG capsule SMARTSIG:4 Capsule(s) By Mouth    . aspirin EC 81 MG tablet Take 81 mg by mouth daily.    . carvedilol (COREG) 6.25 MG tablet TAKE 1 TABLET BY MOUTH 2 TIMES DAILY WITH MEALS 180 tablet 0  . levothyroxine (SYNTHROID) 75 MCG tablet TAKE 1 TABLET BY MOUTH DAILY BEFORE BREAKFAST. 90 tablet 3  . Multiple Vitamin (MULTIVITAMIN WITH MINERALS) TABS  tablet Take 1 tablet by mouth daily.    . sacubitril-valsartan (ENTRESTO) 24-26 MG TAKE 1 TABLET BY MOUTH 2 TIMES DAILY 180 tablet 3  . sildenafil (VIAGRA) 100 MG tablet Take 1 tablet (100 mg total) by mouth daily as needed for erectile dysfunction. 30 tablet 3  . spironolactone (ALDACTONE) 25 MG tablet TAKE 1/2 TABLET BY MOUTH ONCE A DAY 45 tablet 0  . traZODone (DESYREL) 50 MG tablet Take 1 tablet (50 mg total) by mouth at bedtime. 90 tablet 3  . atorvastatin (LIPITOR) 40 MG tablet Take 1 tablet (40 mg total) by mouth daily. 90 tablet 3   No current facility-administered medications for this visit.    LABS/IMAGING: Results for orders placed or performed in visit on 05/14/20 (from the past 48 hour(s))  Lipid panel     Status: Abnormal   Collection Time: 05/16/20  8:34 AM  Result Value Ref Range   Cholesterol, Total 109 100 - 199 mg/dL   Triglycerides 104 0 - 149 mg/dL   HDL 32 (L) >39 mg/dL   VLDL Cholesterol Cal 20 5 - 40 mg/dL   LDL Chol Calc (NIH) 57 0 - 99 mg/dL   Chol/HDL Ratio 3.4  0.0 - 5.0 ratio    Comment:                                   T. Chol/HDL Ratio                                             Men  Women                               1/2 Avg.Risk  3.4    3.3                                   Avg.Risk  5.0    4.4                                2X Avg.Risk  9.6    7.1                                3X Avg.Risk 23.4   11.0    No results found.  WEIGHTS: Wt Readings from Last 3 Encounters:  05/17/20 192 lb (87.1 kg)  01/20/20 193 lb 9.6 oz (87.8 kg)  10/13/19 191 lb 3.2 oz (86.7 kg)    VITALS: BP 106/66   Pulse 61   Ht 5\' 11"  (1.803 m)   Wt 192 lb (87.1 kg)   SpO2 97%   BMI 26.78 kg/m   EXAM: General appearance: alert and no distress Neck: no carotid bruit, no JVD and thyroid not enlarged, symmetric, no tenderness/mass/nodules Lungs: clear to auscultation bilaterally Heart: regular rate and rhythm, S1, S2 normal, no murmur, click, rub or gallop Abdomen:  soft, non-tender; bowel sounds normal; no masses,  no organomegaly Extremities: extremities normal, atraumatic, no cyanosis or edema Pulses: 2+ and symmetric Skin: Skin color, texture, turgor normal. No rashes or lesions Neurologic: Grossly normal Psych: Pleasant  EKG: AV dual paced rhythm at 61-personally reviewed  ASSESSMENT: 1. CAD - s/p PCI to the D1 and mid LCX with DES (11/2015) 2. Status post CABG x2 (LIMA to LAD, SVG to RCA) and bioprosthetic MVR -29 mm Edwards magna bovine bioprosthesis (01/2018) 3. Complete heart block-s/p Medtronic pacemaker (10/2015) with CRT-P upgrade  (01/2019) 4. Acute on chronic systolic congestive heart failure with EF as low as 25% (2019) 5. Palpitations - PVCs including trigeminy and quadrigeminy, generally improved on b-blocker 6. Abnormal EKG with bifascicular block and competing junctional bradycardia 7. History of chest wall radiation for Hodgkin's lymphoma 8. Essential hypertension 9. Hypothyroidism 10. Paroxysmal atrial fibrillation on warfarin  PLAN: 1.   Mr. Nazir seems to be doing very well.  He denies any worsening shortness of breath or chest pain.  Blood pressure is in the low normal range.  Weight has been stable.  He says is not as active as he was in the past but is trying to work on that.  He denies any bleeding issues on the warfarin.  INRs have been well controlled.  And his remote checks have been stable.  Cholesterol is at goal LDL less than  70.  No other medication changes today.  Follow-up with me annually or sooner as necessary  Pixie Casino, MD, [facility], [place] Director of the [facility] Lipid Disorders &  Cardiovascular Risk Reduction [facility] Diplomate of the American Board of Clinical Lipidology Attending Cardiologist  Direct Dial: [phone]  Fax: [phone]  Website:  [email or web address] 05/17/2020, 4:40 PM

## 2020-05-17 NOTE — Patient Instructions (Signed)

## 2020-05-18 ENCOUNTER — Encounter: Payer: Self-pay | Admitting: Internal Medicine

## 2020-05-18 ENCOUNTER — Ambulatory Visit: Payer: 59 | Admitting: Internal Medicine

## 2020-05-18 ENCOUNTER — Other Ambulatory Visit (HOSPITAL_COMMUNITY): Payer: Self-pay

## 2020-05-18 ENCOUNTER — Other Ambulatory Visit: Payer: Self-pay

## 2020-05-18 VITALS — BP 120/78 | HR 94 | Ht 71.0 in | Wt 194.4 lb

## 2020-05-18 DIAGNOSIS — E039 Hypothyroidism, unspecified: Secondary | ICD-10-CM

## 2020-05-18 LAB — TSH: TSH: 3.4 u[IU]/mL (ref 0.35–4.50)

## 2020-05-18 LAB — T4, FREE: Free T4: 0.84 ng/dL (ref 0.60–1.60)

## 2020-05-18 MED ORDER — LEVOTHYROXINE SODIUM 75 MCG PO TABS
ORAL_TABLET | Freq: Every day | ORAL | 3 refills | Status: DC
  Filled 2020-05-18: qty 90, 90d supply, fill #0
  Filled 2020-08-11: qty 90, 90d supply, fill #1
  Filled 2020-10-28: qty 90, 90d supply, fill #2
  Filled 2021-01-21: qty 90, 90d supply, fill #3

## 2020-05-18 NOTE — Progress Notes (Signed)
Patient ID: Tyler Hendrix, male   DOB: 21-Sep-1955, 65 y.o.   MRN: [id number]   This visit occurred during the SARS-CoV-2 public health emergency.  Safety protocols were in place, including screening questions prior to the visit, additional usage of staff PPE, and extensive cleaning of exam room while observing appropriate contact time as indicated for disinfecting solutions.   HPI  BECK TESHIMA is a 65 y.o.-year-old male, returning for follow-up for hypothyroidism.  Last visit 1 year ago.  Interim history: He has been doing well since last visit, without complaints. He denies tremors, palpitations, significant weight loss. He saw Dr. Rennis Golden yesterday -advised to come back in a year.  Reviewed history: Pt. has been dx with hypothyroidism in ~2010. He has been found to have a high TSH during investigation for AVB.  He started to feel better after starting levothyroxine.  Reviewed his TFTs: Lab Results  Component Value Date   TSH 2.84 05/18/2019   TSH 4.17 03/30/2019   TSH 4.630 (H) 03/27/2018   TSH 2.50 05/12/2017   TSH 4.10 09/11/2016   TSH 5.55 (H) 07/03/2016   TSH 5.55 (H) 04/16/2016   TSH 9.38 (H) 01/01/2016   TSH 9.07 (H) 10/29/2015   TSH 4.76 (H) 04/20/2014   FREET4 0.86 05/18/2019   FREET4 0.92 03/30/2019   FREET4 0.96 05/12/2017   FREET4 1.14 09/11/2016   FREET4 0.79 07/03/2016   FREET4 0.87 04/16/2016   FREET4 1.0 01/01/2016    Pt is on levothyroxine 75 mcg daily, taken: - in am (3-5 am)  - fasting - at least 30 min from b'fast - no Ca, Fe, PPIs - + MVI and herbal + mineral suplements in pm - not on Biotin  Pt denies: - feeling nodules in neck - hoarseness - dysphagia - choking - SOB with lying down  Of note, he does have a history of radiation treatment in the head and neck area in 2002.  No FH of thyroid disease or thyroid cancer. No h/o radiation tx to head or neck.  No seaweed or kelp. No recent contrast studies. + herbal + mineral supplements.  No Biotin use. No recent steroids use.   Pt. 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 He had RxTx and ChTx in 2002 for Hodgkin Lymphoma. He had MV replacement, CABG, then admitted with acute on chronic CHF on 03/27/2018. EF 25-30%.   He has a history of A. fib and had cardioversions in the past.  Now he has a biventricular pacemaker. 2D Echo 2021: 30-35%, prev. 40% in 2020. Atorvastatin dose decreased.  ROS: Constitutional: no weight gain/no weight loss, no fatigue, no subjective hyperthermia, no subjective hypothermia Eyes: no blurry vision, no xerophthalmia ENT: no sore throat, + see HPI Cardiovascular: no CP/no SOB/no palpitations/no leg swelling Respiratory: no cough/no SOB/no wheezing Gastrointestinal: no N/no V/no D/no C/no acid reflux Musculoskeletal: no muscle aches/no joint aches Skin: no rashes, no hair loss Neurological: no tremors/no numbness/no tingling/no dizziness  I reviewed pt's medications, allergies, PMH, social hx, family hx, and changes were documented in the history of present illness. Otherwise, unchanged from my initial visit note.  Past Medical History:  Diagnosis Date  . Abnormal TSH   . Arthritis    ddd  . Atrial fibrillation and flutter (HCC)    s/p DCCV 03/26/2018  . CAD in native artery    a. 11/2015: s/p 2 vessel PCI of the first diagonal branch of the LAD in  the mid left circumflex with DES. // s/p CABG 01/2018  . Chronic combined systolic and diastolic CHF (congestive heart failure) (HCC)    sees Dr. Johney Frame   . Complete heart block (HCC)    a. s/p Medtronic ppm 10/2015.  Marland Kitchen History of radiation therapy 2002  . Hodgkin disease (HCC) 2002   a. s/p chest radiation and chemotherapy  . Hypothyroidism    sees Dr. Elvera Lennox   . Incidental pulmonary nodule 01/19/2018   Left apical opacity - possible scarring  . Mitral regurgitation    s/p bioprosthetic MV replacement 01/2018  . Pacemaker 10/2015   Medtronic  . PVC's  (premature ventricular contractions)   . Right bundle branch block   . 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 S/P mitral valve replacement with bioprosthetic valve 01/27/2018   29 mm [facility] Mitral stented bovine pericardial tissue valve   Past Surgical History:  Procedure Laterality Date  . BIV UPGRADE N/A 02/08/2019   Procedure: UPGRADE TO BIV PPM;  Surgeon: Hendrix Range, MD;  Location: [facility] INVASIVE CV LAB;  Service: Cardiovascular;  Laterality: N/A;  . CARDIAC CATHETERIZATION N/A 11/02/2015   Procedure: Left Heart Cath and Coronary Angiography;  Surgeon: Tonny Bollman, MD;  Location: [facility] INVASIVE CV LAB;  Service: Cardiovascular;  Laterality: N/A;  . CARDIAC CATHETERIZATION N/A 11/21/2015   Procedure: Coronary Stent Intervention;  Surgeon: Tonny Bollman, MD;  Location: [facility] INVASIVE CV LAB;  Service: Cardiovascular;  Laterality: N/A;  . CARDIAC CATHETERIZATION  11/2017  . CARDIOVERSION N/A 03/26/2018   Procedure: CARDIOVERSION;  Surgeon: Parke Poisson, MD;  Location: [facility] ENDOSCOPY;  Service: Cardiovascular;  Laterality: N/A;  . COLONOSCOPY  01/16/2015   per Dr. Christella Hartigan, benign polyps, repeat in 10 yrs   . CORONARY ARTERY BYPASS GRAFT N/A 01/27/2018   Procedure: CORONARY ARTERY BYPASS GRAFTING (CABG) x two, using left internal mammary artery and right leg greater saphenous vein harvested endoscopically;  Surgeon: Purcell Nails, MD;  Location: [facility] OR;  Service: Open Heart Surgery;  Laterality: N/A;  . EP IMPLANTABLE DEVICE N/A 11/02/2015   MDT Adivisa MRI conditional dual chamber pacemaker implanted by Dr Johney Frame for complete heart block  . KNEE SURGERY Right 1972  . LUMBAR LAMINECTOMY  1996  . MITRAL VALVE REPLACEMENT N/A 01/27/2018   Procedure: MITRAL VALVE (MV) REPLACEMENT using Magna Mitral Ease Valve Size ;  Surgeon: Purcell Nails, MD;  Location: [facility] OR;  Service: Open Heart Surgery;  Laterality: N/A;  . RIGHT/LEFT HEART CATH AND  CORONARY ANGIOGRAPHY N/A 12/02/2017   Procedure: RIGHT/LEFT HEART CATH AND CORONARY ANGIOGRAPHY;  Surgeon: Corky Crafts, MD;  Location: [facility] INVASIVE CV LAB;  Service: Cardiovascular;  Laterality: N/A;  . TEE WITHOUT CARDIOVERSION N/A 12/09/2017   Procedure: TRANSESOPHAGEAL ECHOCARDIOGRAM (TEE);  Surgeon: Jodelle Red, MD;  Location: [facility] ENDOSCOPY;  Service: Cardiovascular;  Laterality: N/A;  . TEE WITHOUT CARDIOVERSION N/A 01/27/2018   Procedure: TRANSESOPHAGEAL ECHOCARDIOGRAM (TEE);  Surgeon: Purcell Nails, MD;  Location: [facility] OR;  Service: Open Heart Surgery;  Laterality: N/A;   Social History   Socioeconomic History  . Marital status: Married    Spouse name: Not on file  . Number of children: 2  . Years of education: Masters  . Highest education level: Not on file  Occupational History  . Occupation: Clinical research associate    Comment: [employer]  Tobacco Use  . Smoking status: Never Smoker  .  Smokeless tobacco: Never Used  Vaping Use  . Vaping Use: Never used  Substance and Sexual Activity  . Alcohol use: Yes    Alcohol/week: 5.0 standard drinks    Types: 5 Glasses of wine per week  . Drug use: No  . Sexual activity: Not on file  Other Topics Concern  . 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 amoxicillin (AMOXIL) 500 MG capsule SMARTSIG:4 Capsule(s) By Mouth    . aspirin EC 81 MG tablet Take 81 mg by mouth daily.    Marland Kitchen atorvastatin (LIPITOR) 40 MG tablet Take 1 tablet (40 mg total) by mouth daily. 90 tablet 3  . carvedilol (COREG) 6.25 MG tablet TAKE 1 TABLET BY  MOUTH 2 TIMES DAILY WITH MEALS 180 tablet 0  . levothyroxine (SYNTHROID) 75 MCG tablet TAKE 1 TABLET BY MOUTH DAILY BEFORE BREAKFAST. 90 tablet 3  . Multiple Vitamin (MULTIVITAMIN WITH MINERALS) TABS tablet Take 1 tablet by mouth daily.    . sacubitril-valsartan (ENTRESTO) 24-26 MG TAKE 1 TABLET BY MOUTH 2 TIMES DAILY 180 tablet 3  . sildenafil (VIAGRA) 100 MG tablet Take 1 tablet (100 mg total) by mouth daily as needed for erectile dysfunction. 30 tablet 3  . spironolactone (ALDACTONE) 25 MG tablet TAKE 1/2 TABLET BY MOUTH ONCE A DAY 45 tablet 0  . traZODone (DESYREL) 50 MG tablet Take 1 tablet (50 mg total) by mouth at bedtime. 90 tablet 3   No current facility-administered medications on file prior to visit.   Allergies  Allergen Reactions  . Other     Pt reported allergic to dust mites that causes of sneezing, runny nose   Family History  Problem Relation Age of Onset  . Cancer Other        prostate father hx  . Heart disease Mother   . Heart disease Father   . Colon cancer Neg Hx     PE: BP 120/78 (BP Location: Right Arm, Patient Position: Sitting, Cuff Size: Normal)   Pulse 94   Ht 5\' 11"  (1.803 m)   Wt 194 lb 6.4 oz (88.2 kg)   SpO2 96%   BMI 27.11 kg/m  Wt Readings from Last 3 Encounters:  05/18/20 194 lb 6.4 oz (88.2 kg)  05/17/20 192 lb (87.1 kg)  01/20/20 193 lb 9.6 oz (87.8 kg)   Constitutional: overweight, in NAD Eyes: PERRLA, EOMI, no exophthalmos ENT: moist mucous membranes, no thyromegaly, no cervical lymphadenopathy Cardiovascular: tachycardia, RR, No MRG Respiratory: CTA B Gastrointestinal: abdomen soft, NT, ND, BS+ Musculoskeletal: no deformities, strength intact in all 4 Skin: moist, warm, no rashes Neurological: no tremor with outstretched hands, DTR normal in all 4  ASSESSMENT: 1.  Acquired hypothyroidism  PLAN:  1. Patient with longstanding hypothyroidism, on levothyroxine -At this visit, patient denies neck compression symptoms -He  continues on levothyroxine 75 mcg daily -He feels good on this dose, without complaints - latest thyroid labs reviewed with pt >> normal at last visit: Lab Results  Component Value Date   TSH 2.84 05/18/2019  - we discussed about taking  the thyroid hormone every day, with water, >30 minutes before breakfast, separated by >4 hours from acid reflux medications, calcium, iron, multivitamins. Pt. is taking it correctly now.  In the past, he was taking levothyroxine after breakfast, along with the multivitamins though removed breakfast 30 minutes later and multivitamins at least 4 hours later -60s in the afternoon - will check thyroid tests today: TSH and fT4 - If labs are abnormal, he will need to return for repeat TFTs in 1.5 months - OTW, I will see him back in 1 year  Needs refills.   Component     Latest Ref Rng & Units 05/18/2020  TSH     0.35 - 4.50 uIU/mL 3.40  T4,Free(Direct)     0.60 - 1.60 ng/dL 1.61  TFTs are normal.  We will refill his medication.  Carlus Pavlov, MD PhD [facility] Endocrinology

## 2020-05-18 NOTE — Patient Instructions (Signed)
Please continue Levothyroxine 75 mcg daily.  Take the thyroid hormone every day, with water, at least 30 minutes before breakfast, separated by at least 4 hours from: - acid reflux medications - calcium - iron - multivitamins  Please stop at the lab.  Please come back for a follow-up appointment in 1 year. 

## 2020-05-24 ENCOUNTER — Ambulatory Visit (INDEPENDENT_AMBULATORY_CARE_PROVIDER_SITE_OTHER): Payer: 59 | Admitting: Family Medicine

## 2020-05-24 ENCOUNTER — Other Ambulatory Visit (HOSPITAL_COMMUNITY): Payer: Self-pay

## 2020-05-24 ENCOUNTER — Other Ambulatory Visit: Payer: Self-pay

## 2020-05-24 ENCOUNTER — Encounter: Payer: Self-pay | Admitting: Family Medicine

## 2020-05-24 VITALS — BP 110/78 | HR 55 | Temp 98.4°F | Ht 71.0 in | Wt 190.0 lb

## 2020-05-24 DIAGNOSIS — Z0000 Encounter for general adult medical examination without abnormal findings: Secondary | ICD-10-CM | POA: Diagnosis not present

## 2020-05-24 LAB — CBC WITH DIFFERENTIAL/PLATELET
Basophils Absolute: 0 10*3/uL (ref 0.0–0.1)
Basophils Relative: 0.5 % (ref 0.0–3.0)
Eosinophils Absolute: 0.2 10*3/uL (ref 0.0–0.7)
Eosinophils Relative: 2.5 % (ref 0.0–5.0)
HCT: 41.4 % (ref 39.0–52.0)
Hemoglobin: 14.3 g/dL (ref 13.0–17.0)
Lymphocytes Relative: 12.2 % (ref 12.0–46.0)
Lymphs Abs: 1 10*3/uL (ref 0.7–4.0)
MCHC: 34.4 g/dL (ref 30.0–36.0)
MCV: 93.9 fl (ref 78.0–100.0)
Monocytes Absolute: 0.5 10*3/uL (ref 0.1–1.0)
Monocytes Relative: 5.7 % (ref 3.0–12.0)
Neutro Abs: 6.5 10*3/uL (ref 1.4–7.7)
Neutrophils Relative %: 79.1 % — ABNORMAL HIGH (ref 43.0–77.0)
Platelets: 272 10*3/uL (ref 150.0–400.0)
RBC: 4.41 Mil/uL (ref 4.22–5.81)
RDW: 13.8 % (ref 11.5–15.5)
WBC: 8.2 10*3/uL (ref 4.0–10.5)

## 2020-05-24 LAB — BASIC METABOLIC PANEL
BUN: 15 mg/dL (ref 6–23)
CO2: 26 mEq/L (ref 19–32)
Calcium: 9.5 mg/dL (ref 8.4–10.5)
Chloride: 104 mEq/L (ref 96–112)
Creatinine, Ser: 1.09 mg/dL (ref 0.40–1.50)
GFR: 71.94 mL/min (ref >60.00)
Glucose, Bld: 98 mg/dL (ref 70–99)
Potassium: 4.8 mEq/L (ref 3.5–5.1)
Sodium: 139 mEq/L (ref 135–145)

## 2020-05-24 LAB — HEPATIC FUNCTION PANEL
ALT: 25 U/L (ref 0–53)
AST: 24 U/L (ref 0–37)
Albumin: 4.3 g/dL (ref 3.5–5.2)
Alkaline Phosphatase: 63 U/L (ref 39–117)
Bilirubin, Direct: 0.2 mg/dL (ref 0.0–0.3)
Total Bilirubin: 0.9 mg/dL (ref 0.2–1.2)
Total Protein: 7.1 g/dL (ref 6.0–8.3)

## 2020-05-24 LAB — PSA: PSA: 3.3 ng/mL (ref 0.10–4.00)

## 2020-05-24 MED ORDER — ATORVASTATIN CALCIUM 40 MG PO TABS
40.0000 mg | ORAL_TABLET | Freq: Every day | ORAL | 3 refills | Status: DC
  Filled 2020-05-24: qty 90, 90d supply, fill #0
  Filled 2020-08-27: qty 90, 90d supply, fill #1
  Filled 2020-12-03: qty 90, 90d supply, fill #2
  Filled 2021-03-11: qty 90, 90d supply, fill #3

## 2020-05-24 MED ORDER — SILDENAFIL CITRATE 50 MG PO TABS
50.0000 mg | ORAL_TABLET | Freq: Every day | ORAL | 3 refills | Status: DC | PRN
  Filled 2020-05-24: qty 6, 30d supply, fill #0

## 2020-05-24 MED ORDER — TRAZODONE HCL 50 MG PO TABS
50.0000 mg | ORAL_TABLET | Freq: Every day | ORAL | 3 refills | Status: DC
  Filled 2020-05-24: qty 90, 90d supply, fill #0
  Filled 2020-10-28: qty 90, 90d supply, fill #1
  Filled 2021-01-21: qty 90, 90d supply, fill #2
  Filled 2021-04-27: qty 90, 90d supply, fill #3

## 2020-05-24 NOTE — Progress Notes (Signed)
Subjective:    Patient ID: Tyler Hendrix, male    DOB: 1955-12-12, 65 y.o.   MRN: [id number]  HPI Here for a well exam. He feels great. He sees Dr. Rennis Golden for cardiology exams and he sees Dr. Elvera Lennox for hypothyroidism.    Review of Systems  Constitutional: Negative.   HENT: Negative.   Eyes: Negative.   Respiratory: Negative.   Cardiovascular: Negative.   Gastrointestinal: Negative.   Genitourinary: Negative.   Musculoskeletal: Negative.   Skin: Negative.   Neurological: Negative.   Psychiatric/Behavioral: Negative.        Objective:   Physical Exam Constitutional:      General: He is not in acute distress.    Appearance: Normal appearance. He is well-developed. He is not diaphoretic.  HENT:     Head: Normocephalic and atraumatic.     Right Ear: External ear normal.     Left Ear: External ear normal.     Nose: Nose normal.     Mouth/Throat:     Pharynx: No oropharyngeal exudate.  Eyes:     General: No scleral icterus.       Right eye: No discharge.        Left eye: No discharge.     Conjunctiva/sclera: Conjunctivae normal.     Pupils: Pupils are equal, round, and reactive to light.  Neck:     Thyroid: No thyromegaly.     Vascular: No JVD.     Trachea: No tracheal deviation.  Cardiovascular:     Rate and Rhythm: Normal rate and regular rhythm.     Heart sounds: Normal heart sounds. No murmur heard. No friction rub. No gallop.   Pulmonary:     Effort: Pulmonary effort is normal. No respiratory distress.     Breath sounds: Normal breath sounds. No wheezing or rales.  Chest:     Chest wall: No tenderness.  Abdominal:     General: Bowel sounds are normal. There is no distension.     Palpations: Abdomen is soft. There is no mass.     Tenderness: There is no abdominal tenderness. There is no guarding or rebound.  Genitourinary:    Penis: Normal. No tenderness.      Testes: Normal.     Prostate: Normal.     Rectum: Normal. Guaiac result negative.   Musculoskeletal:        General: No tenderness. Normal range of motion.     Cervical back: Neck supple.  Lymphadenopathy:     Cervical: No cervical adenopathy.  Skin:    General: Skin is warm and dry.     Coloration: Skin is not pale.     Findings: No erythema or rash.  Neurological:     Mental Status: He is alert and oriented to person, place, and time.     Cranial Nerves: No cranial nerve deficit.     Motor: No abnormal muscle tone.     Coordination: Coordination normal.     Deep Tendon Reflexes: Reflexes are normal and symmetric. Reflexes normal.  Psychiatric:        Behavior: Behavior normal.        Thought Content: Thought content normal.        Judgment: Judgment normal.           Assessment & Plan:  Well exam. We discussed diet and exercise. Get fasting labs.  Gershon Crane, MD

## 2020-05-24 NOTE — Progress Notes (Signed)
Remote pacemaker transmission.   

## 2020-06-13 ENCOUNTER — Other Ambulatory Visit (HOSPITAL_COMMUNITY): Payer: Self-pay

## 2020-06-13 DIAGNOSIS — C4441 Basal cell carcinoma of skin of scalp and neck: Secondary | ICD-10-CM | POA: Diagnosis not present

## 2020-06-13 DIAGNOSIS — D225 Melanocytic nevi of trunk: Secondary | ICD-10-CM | POA: Diagnosis not present

## 2020-06-13 DIAGNOSIS — L570 Actinic keratosis: Secondary | ICD-10-CM | POA: Diagnosis not present

## 2020-06-13 DIAGNOSIS — Z1283 Encounter for screening for malignant neoplasm of skin: Secondary | ICD-10-CM | POA: Diagnosis not present

## 2020-06-13 DIAGNOSIS — X32XXXD Exposure to sunlight, subsequent encounter: Secondary | ICD-10-CM | POA: Diagnosis not present

## 2020-06-13 DIAGNOSIS — D0422 Carcinoma in situ of skin of left ear and external auricular canal: Secondary | ICD-10-CM | POA: Diagnosis not present

## 2020-06-13 DIAGNOSIS — C44311 Basal cell carcinoma of skin of nose: Secondary | ICD-10-CM | POA: Diagnosis not present

## 2020-06-13 MED ORDER — DOXYCYCLINE HYCLATE 100 MG PO TABS
100.0000 mg | ORAL_TABLET | Freq: Two times a day (BID) | ORAL | 1 refills | Status: DC
  Filled 2020-06-13: qty 60, 30d supply, fill #0

## 2020-07-14 ENCOUNTER — Other Ambulatory Visit (HOSPITAL_COMMUNITY): Payer: Self-pay

## 2020-07-14 MED FILL — Sacubitril-Valsartan Tab 24-26 MG: ORAL | 90 days supply | Qty: 180 | Fill #0 | Status: AC

## 2020-07-16 ENCOUNTER — Other Ambulatory Visit (HOSPITAL_COMMUNITY): Payer: Self-pay

## 2020-07-30 ENCOUNTER — Other Ambulatory Visit: Payer: Self-pay | Admitting: Internal Medicine

## 2020-07-31 ENCOUNTER — Other Ambulatory Visit (HOSPITAL_COMMUNITY): Payer: Self-pay

## 2020-07-31 MED ORDER — CARVEDILOL 6.25 MG PO TABS
ORAL_TABLET | ORAL | 0 refills | Status: DC
  Filled 2020-07-31: qty 180, 90d supply, fill #0

## 2020-07-31 MED ORDER — SPIRONOLACTONE 25 MG PO TABS
ORAL_TABLET | Freq: Every day | ORAL | 0 refills | Status: DC
  Filled 2020-07-31: qty 45, 90d supply, fill #0

## 2020-08-09 ENCOUNTER — Telehealth: Payer: Self-pay

## 2020-08-09 ENCOUNTER — Ambulatory Visit (INDEPENDENT_AMBULATORY_CARE_PROVIDER_SITE_OTHER): Payer: 59

## 2020-08-09 DIAGNOSIS — I428 Other cardiomyopathies: Secondary | ICD-10-CM | POA: Diagnosis not present

## 2020-08-09 LAB — CUP PACEART REMOTE DEVICE CHECK
Battery Remaining Longevity: 78 mo
Battery Voltage: 2.98 V
Brady Statistic AP VP Percent: 94.86 %
Brady Statistic AP VS Percent: 0.03 %
Brady Statistic AS VP Percent: 4.87 %
Brady Statistic AS VS Percent: 0.24 %
Brady Statistic RA Percent Paced: 95.03 %
Brady Statistic RV Percent Paced: 99.73 %
Date Time Interrogation Session: 20220630023540
Implantable Lead Implant Date: 20170922
Implantable Lead Implant Date: 20170922
Implantable Lead Implant Date: 20201229
Implantable Lead Location: 753858
Implantable Lead Location: 753859
Implantable Lead Location: 753860
Implantable Lead Model: 4598
Implantable Lead Model: 5076
Implantable Lead Model: 5076
Implantable Pulse Generator Implant Date: 20201229
Lead Channel Impedance Value: 2318 Ohm
Lead Channel Impedance Value: 304 Ohm
Lead Channel Impedance Value: 380 Ohm
Lead Channel Impedance Value: 456 Ohm
Lead Channel Impedance Value: 475 Ohm
Lead Channel Impedance Value: 475 Ohm
Lead Channel Impedance Value: 513 Ohm
Lead Channel Impedance Value: 513 Ohm
Lead Channel Impedance Value: 551 Ohm
Lead Channel Impedance Value: 760 Ohm
Lead Channel Impedance Value: 798 Ohm
Lead Channel Impedance Value: 855 Ohm
Lead Channel Impedance Value: 874 Ohm
Lead Channel Impedance Value: 912 Ohm
Lead Channel Pacing Threshold Amplitude: 0.5 V
Lead Channel Pacing Threshold Amplitude: 0.75 V
Lead Channel Pacing Threshold Amplitude: 2.125 V
Lead Channel Pacing Threshold Pulse Width: 0.4 ms
Lead Channel Pacing Threshold Pulse Width: 0.4 ms
Lead Channel Pacing Threshold Pulse Width: 1 ms
Lead Channel Sensing Intrinsic Amplitude: 12.625 mV
Lead Channel Sensing Intrinsic Amplitude: 12.625 mV
Lead Channel Sensing Intrinsic Amplitude: 3.5 mV
Lead Channel Sensing Intrinsic Amplitude: 3.5 mV
Lead Channel Setting Pacing Amplitude: 2 V
Lead Channel Setting Pacing Amplitude: 2.5 V
Lead Channel Setting Pacing Amplitude: 2.5 V
Lead Channel Setting Pacing Pulse Width: 0.4 ms
Lead Channel Setting Pacing Pulse Width: 1 ms
Lead Channel Setting Sensing Sensitivity: 4 mV

## 2020-08-09 NOTE — Telephone Encounter (Signed)
Carelink alert received for Low effective CRT pacing, 40% (effective) of the time, total V Pacing 99.7%. Device check on 05/10/20 (effective) 50.4% and total 99.7%. Per last Ov note on 01/19/21, VV timing adjusted (,LV> RV with V-V pace delay at 40 ms per JA to optimize CRT-P, per paceart report) "Consider AV Optimization if not improved with this change."  Consulted Medtronic rep and agrees that patient needs device Optimized. Consulting Dr. Rayann Heman for his review and recommendations?  Also, atrial lead impedance is increasing overtime. Any recommendations.

## 2020-08-11 ENCOUNTER — Other Ambulatory Visit (HOSPITAL_COMMUNITY): Payer: Self-pay

## 2020-08-25 NOTE — Telephone Encounter (Signed)
Continue to follow atrial lead impedance.  Tyler Hendrix, can you arrange optimization with Mr Thomason?  Thanks!

## 2020-08-27 ENCOUNTER — Other Ambulatory Visit (HOSPITAL_COMMUNITY): Payer: Self-pay

## 2020-08-27 ENCOUNTER — Other Ambulatory Visit: Payer: Self-pay | Admitting: Student

## 2020-08-27 DIAGNOSIS — I428 Other cardiomyopathies: Secondary | ICD-10-CM

## 2020-08-27 DIAGNOSIS — I5022 Chronic systolic (congestive) heart failure: Secondary | ICD-10-CM

## 2020-08-29 NOTE — Progress Notes (Signed)
Remote pacemaker transmission.   

## 2020-09-17 NOTE — Progress Notes (Addendum)
Electrophysiology CRT AV optimization   Date: 09/17/2020  ID:  Tyler Hendrix, DOB [birth year], MRN [id number]  PCP: [name], MD Primary Cardiologist: Chrystie Nose, MD Electrophysiologist: Hillis Range, MD   CC: Heart failure despite LV lead placement  Medtronic dual chamber device 10/2015 -> upgrade to CRT-P 02/08/2019 for CHB / CHF  LV lead History: Model: 4598 Attain Location: Distal lateral portion of a large middle cardiac vein Threshold 2.75V @ 1.0 ms Vector LV3 > LV2 Revisions/CXR: N/A Diaphragmatic Stim: N/A VV timing: LV -> RV 40 msec Prior optimzation/changes: VV timing changed 01/2020 from Adapative to LV-> RV 40 ms   Echocardiogram: Pre-device implant: 07/16/2018 LVEF 30-35% (V pacing) Post-device implant: 05/2019 LVEF 40%  -> 11/2019 LVEF 30-35%  EKG: Pre-device implant: 04/12/2018 AS-VP 77 bpm, QRS 164 ms. Upright lead 1, down V1 Post-device implant: 02/08/2019 AS BiV paced, QRS 140 ms, Initial upward deflection lead 1, but mostly negative, V1 mostly positive   ICD interrogation: CRT pacing: 99.7% by last interrogation but only 40% "effective" AF: 0% PVC burden: VS < 1%  Thoracic impedence: below threshold and stable Activity Level: 1-2 hours daily HR excursion: Histograms appropriate See PaceArt report for full details.   Multi-site Pacing: Not available  EKG:  EKG is not ordered today. Consider VV optimization at later date based on response to AV optimization.   Assessment and Plan:  1.  Chronic systolic heart failure Patient reports relatively stable symptoms status post CRT implant Device interrogation today demonstrates slightly elevated LV threshold compared to previous. Amplitude increased to 3.0 V @ 1.0 ms. May need to adjust further at next visit.  Under echo. PAV 150 appeared to have the best separation of E/A wave without being too far apart or having truncation.  In Echo PAV 150 is LV -> RV 40 ms, PAV 150 2 is LV = RV. No  appreciable difference  Disposition:   FU with EP-APP in 3-4 weeks.   Dustin Flock, PA-C  09/17/2020 11:37 AM  [facility] [address] [place] [address] [phone] (office) [phone] (fax

## 2020-09-18 ENCOUNTER — Ambulatory Visit (INDEPENDENT_AMBULATORY_CARE_PROVIDER_SITE_OTHER): Payer: 59 | Admitting: Student

## 2020-09-18 ENCOUNTER — Ambulatory Visit (HOSPITAL_COMMUNITY): Payer: 59 | Attending: Internal Medicine

## 2020-09-18 ENCOUNTER — Other Ambulatory Visit: Payer: Self-pay

## 2020-09-18 ENCOUNTER — Encounter: Payer: Self-pay | Admitting: Student

## 2020-09-18 VITALS — Ht 71.0 in | Wt 191.8 lb

## 2020-09-18 DIAGNOSIS — I428 Other cardiomyopathies: Secondary | ICD-10-CM | POA: Insufficient documentation

## 2020-09-18 DIAGNOSIS — I5022 Chronic systolic (congestive) heart failure: Secondary | ICD-10-CM | POA: Diagnosis not present

## 2020-09-18 DIAGNOSIS — I2510 Atherosclerotic heart disease of native coronary artery without angina pectoris: Secondary | ICD-10-CM

## 2020-09-18 LAB — CUP PACEART INCLINIC DEVICE CHECK
Date Time Interrogation Session: 20220809122928
Implantable Lead Implant Date: 20170922
Implantable Lead Implant Date: 20170922
Implantable Lead Implant Date: 20201229
Implantable Lead Location: 753858
Implantable Lead Location: 753859
Implantable Lead Location: 753860
Implantable Lead Model: 4598
Implantable Lead Model: 5076
Implantable Lead Model: 5076
Implantable Pulse Generator Implant Date: 20201229

## 2020-09-18 LAB — ECHOCARDIOGRAM LIMITED
Height: 71 in
S' Lateral: 5.4 cm
Weight: 3068.8 oz

## 2020-09-18 NOTE — Patient Instructions (Signed)
Medication Instructions:  Your physician recommends that you continue on your current medications as directed. Please refer to the Current Medication list given to you today.  *If you need a refill on your cardiac medications before your next appointment, please call your pharmacy*   Lab Work: None If you have labs (blood work) drawn today and your tests are completely normal, you will receive your results only by: [place] (if you have MyChart) OR A paper copy in the mail If you have any lab test that is abnormal or we need to change your treatment, we will call you to review the results.   Follow-Up: At [facility], you and your health needs are our priority.  As part of our continuing mission to provide you with exceptional heart care, we have created designated Provider Care Teams.  These Care Teams include your primary Cardiologist (physician) and Advanced Practice Providers (APPs -  Physician Assistants and Nurse Practitioners) who all work together to provide you with the care you need, when you need it.

## 2020-10-09 ENCOUNTER — Encounter: Payer: 59 | Admitting: Student

## 2020-10-18 ENCOUNTER — Other Ambulatory Visit (HOSPITAL_COMMUNITY): Payer: Self-pay

## 2020-10-18 MED FILL — Sacubitril-Valsartan Tab 24-26 MG: ORAL | 90 days supply | Qty: 180 | Fill #1 | Status: AC

## 2020-10-19 ENCOUNTER — Other Ambulatory Visit (HOSPITAL_COMMUNITY): Payer: Self-pay

## 2020-10-25 NOTE — Progress Notes (Deleted)
Electrophysiology Office Note Date: 10/25/2020  ID:  Tyler AGRELLA, DOB 1955-06-10, MRN [id number]  PCP: [name], MD Primary Cardiologist: Chrystie Nose, MD Electrophysiologist: Hillis Range, MD ***  CC: Pacemaker follow-up  Tyler Hendrix is a 65 y.o. male seen today for Hillis Range, MD for {Blank single:19197::"cardiac clearance","post hospital follow up","acute visit due to ***","routine electrophysiology followup"}.  Since {Blank single:19197::"last being seen in our clinic","discharge from hospital"} the patient reports doing ***.  he denies chest pain, palpitations, dyspnea, PND, orthopnea, nausea, vomiting, dizziness, syncope, edema, weight gain, or early satiety.  Device History: {Blank single:19197::"Medtronic","St. Jude","[employer] Scientific","Biotronik"} {Blank single:19197::"Dual Chamber","Single Chamber","BiV","Leadless"} PPM implanted *** for ***  Past Medical History:  Diagnosis Date   Abnormal TSH    Arthritis    ddd   Atrial fibrillation and flutter (HCC)    s/p DCCV 03/26/2018   CAD in native artery    a. 11/2015: s/p 2 vessel PCI of the first diagonal branch of the LAD in the mid left circumflex with DES. // s/p CABG 01/2018   Chronic combined systolic and diastolic CHF (congestive heart failure) (HCC)    sees Dr. Johney Frame    Complete heart block (HCC)    a. s/p Medtronic ppm 10/2015.   History of radiation therapy 2002   Hodgkin disease (HCC) 2002   a. s/p chest radiation and chemotherapy   Hypothyroidism    sees Dr. 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/27/2018   29 mm [facility]  Mitral stented bovine pericardial tissue valve   Past Surgical History:  Procedure Laterality Date   BIV UPGRADE N/A 02/08/2019   Procedure: UPGRADE TO BIV PPM;  Surgeon: Hillis Range, MD;  Location: [facility] INVASIVE CV LAB;  Service: Cardiovascular;  Laterality: N/A;   CARDIAC CATHETERIZATION N/A 11/02/2015   Procedure: Left Heart Cath and Coronary Angiography;  Surgeon: Tonny Bollman, MD;  Location: [facility] INVASIVE CV LAB;  Service: Cardiovascular;  Laterality: N/A;   CARDIAC CATHETERIZATION N/A 11/21/2015   Procedure: Coronary Stent Intervention;  Surgeon: Tonny Bollman, MD;  Location: [facility] INVASIVE CV LAB;  Service: Cardiovascular;  Laterality: N/A;   CARDIAC CATHETERIZATION  11/2017   CARDIOVERSION N/A 03/26/2018   Procedure: CARDIOVERSION;  Surgeon: Parke Poisson, MD;  Location: [facility] ENDOSCOPY;  Service: Cardiovascular;  Laterality: N/A;   COLONOSCOPY  01/16/2015   per Dr. Christella Hartigan, benign polyps, repeat in 10 yrs    CORONARY ARTERY BYPASS GRAFT N/A 01/27/2018   Procedure: CORONARY ARTERY BYPASS GRAFTING (CABG) x two, using left internal mammary artery and right leg greater saphenous vein harvested endoscopically;  Surgeon: Purcell Nails, MD;  Location: [facility] OR;  Service: Open Heart Surgery;  Laterality: N/A;   EP IMPLANTABLE DEVICE N/A 11/02/2015   MDT Adivisa MRI conditional dual chamber pacemaker implanted by Dr Johney Frame for complete heart block   KNEE SURGERY Right 1972   LUMBAR LAMINECTOMY  1996   MITRAL VALVE REPLACEMENT N/A 01/27/2018   Procedure: MITRAL VALVE (MV) REPLACEMENT using Magna Mitral Ease Valve Size ;  Surgeon: Purcell Nails, MD;  Location: [facility] OR;  Service: Open Heart Surgery;  Laterality: N/A;   RIGHT/LEFT HEART CATH AND CORONARY ANGIOGRAPHY N/A 12/02/2017   Procedure: RIGHT/LEFT HEART CATH AND CORONARY ANGIOGRAPHY;  Surgeon: Corky Crafts, MD;  Location: [facility] INVASIVE CV LAB;  Service: Cardiovascular;  Laterality: N/A;   TEE WITHOUT CARDIOVERSION N/A 12/09/2017    Procedure: TRANSESOPHAGEAL ECHOCARDIOGRAM (TEE);  Surgeon: Jodelle Red, MD;  Location: [facility] ENDOSCOPY;  Service: Cardiovascular;  Laterality: N/A;   TEE WITHOUT CARDIOVERSION N/A 01/27/2018   Procedure: TRANSESOPHAGEAL ECHOCARDIOGRAM (TEE);  Surgeon: Purcell Nails, MD;  Location[facility] OR;  Service: Open Heart Surgery;  Laterality: N/A;    Current Outpatient Medications  Medication Sig Dispense Refill   aspirin EC 81 MG tablet Take 81 mg by mouth daily.     atorvastatin (LIPITOR) 40 MG tablet Take 1 tablet (40 mg total) by mouth daily. 90 tablet 3   carvedilol (COREG) 6.25 MG tablet TAKE 1 TABLET BY MOUTH 2 TIMES DAILY WITH MEALS 180 tablet 0   doxycycline (VIBRA-TABS) 100 MG tablet Take 1 tablet (100 mg total) by mouth 2 (two) times daily as needed with food (sun warning) 60 tablet 1   levothyroxine (SYNTHROID) 75 MCG tablet TAKE 1 TABLET BY MOUTH DAILY BEFORE BREAKFAST. 90 tablet 3   Multiple Vitamin (MULTIVITAMIN WITH MINERALS) TABS tablet Take 1 tablet by mouth daily.     sacubitril-valsartan (ENTRESTO) 24-26 MG TAKE 1 TABLET BY MOUTH 2 TIMES DAILY 180 tablet 3   sildenafil (VIAGRA) 50 MG tablet Take 1 tablet (50 mg total) by mouth daily as needed for erectile dysfunction. 30 tablet 3   spironolactone (ALDACTONE) 25 MG tablet TAKE 1/2 TABLET BY MOUTH ONCE A DAY 45 tablet 0   traZODone (DESYREL) 50 MG tablet Take 1 tablet (50 mg total) by mouth at bedtime. 90 tablet 3   No current facility-administered medications for this visit.    Allergies:   Other   Social History: Social History   Socioeconomic History   Marital status: Married    Spouse name: Not on file   Number of children: 2   Years of education: Masters   Highest education level: Not on file  Occupational History   Occupation: lawyer    Comment: [employer]  Tobacco Use   Smoking status: Never   Smokeless tobacco: Never  Vaping Use   Vaping Use: Never used  Substance and Sexual  Activity   Alcohol use: Yes    Alcohol/week: 5.0 standard drinks    Types: 5 Glasses of wine per week   Drug use: No   Sexual activity: Not on file  Other Topics Concern   Not on file  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  Physical Exam: There were no vitals filed for this visit.   GEN- The  patient is well appearing, alert and oriented x 3 today.   HEENT: normocephalic, atraumatic; sclera clear, conjunctiva pink; hearing intact; oropharynx clear; neck supple  Lungs- Clear to ausculation bilaterally, normal work of breathing.  No wheezes, rales, rhonchi Heart- Regular rate and rhythm, no murmurs, rubs or gallops  GI- soft, non-tender, non-distended, bowel sounds present  Extremities- no clubbing or cyanosis. No edema MS- no significant deformity or atrophy Skin- warm and dry, no rash or lesion; PPM pocket well healed Psych- euthymic mood, full affect Neuro- strength and sensation are intact  PPM Interrogation- reviewed in detail today,  See PACEART report  EKG:  EKG {ACTION; IS/IS [id number]} ordered today. Personal review of ekg ordered {Blank single:19197::"today","***"} shows ***   Recent Labs: 05/18/2020: TSH 3.40 05/24/2020: ALT 25; BUN 15; Creatinine, Ser 1.09; Hemoglobin 14.3; Platelets 272.0; Potassium 4.8; Sodium 139   Wt Readings from Last 3 Encounters:  09/18/20 191 lb 12.8 oz (87 kg)  05/24/20  190 lb (86.2 kg)  05/18/20 194 lb 6.4 oz (88.2 kg)     Other studies Reviewed: Additional studies/ records that were reviewed today include: Previous EP office notes, Previous remote checks, Most recent labwork.   Assessment and Plan:  1. CHB s/p Medtronic CRT-P Normal PPM function See Pace Art report No changes today  2. Chronic systolic CHF V-V optimization 86/5784 A-V optimization 09/18/2020. NYHA *** symptoms Volume status *** Stable on appropriate regimen  3. S/p MVR with CABG Stable Denies s/s ischemia  4. Persistent AF Chronically low burdens by device On coumadin for CHA2DS2-VASc of at least 3.  Current medicines are reviewed at length with the patient today.     Labs/ tests ordered today include: *** No orders of the defined types were placed in this encounter.   Disposition:   Follow up with {Blank single:19197::"Dr. Allred","Dr. Amada Jupiter. Klein","Dr. Camnitz","Dr. Lambert","EP APP"} in *** {Blank single:19197::"Months","Weeks"}    Signed, Graciella Freer, PA-C  10/25/2020 9:36 AM  [facility] [address] [place] [address] [phone] (office) [phone] (fax)

## 2020-10-26 ENCOUNTER — Encounter: Payer: 59 | Admitting: Student

## 2020-10-26 DIAGNOSIS — I2510 Atherosclerotic heart disease of native coronary artery without angina pectoris: Secondary | ICD-10-CM

## 2020-10-26 DIAGNOSIS — Z952 Presence of prosthetic heart valve: Secondary | ICD-10-CM

## 2020-10-26 DIAGNOSIS — I5022 Chronic systolic (congestive) heart failure: Secondary | ICD-10-CM

## 2020-10-26 DIAGNOSIS — I428 Other cardiomyopathies: Secondary | ICD-10-CM

## 2020-10-28 ENCOUNTER — Other Ambulatory Visit (HOSPITAL_COMMUNITY): Payer: Self-pay

## 2020-10-28 ENCOUNTER — Other Ambulatory Visit: Payer: Self-pay | Admitting: Internal Medicine

## 2020-10-29 ENCOUNTER — Other Ambulatory Visit (HOSPITAL_COMMUNITY): Payer: Self-pay

## 2020-10-30 ENCOUNTER — Other Ambulatory Visit (HOSPITAL_COMMUNITY): Payer: Self-pay

## 2020-10-30 MED ORDER — SPIRONOLACTONE 25 MG PO TABS
ORAL_TABLET | Freq: Every day | ORAL | 0 refills | Status: DC
  Filled 2020-10-30: qty 45, 90d supply, fill #0

## 2020-10-30 MED ORDER — CARVEDILOL 6.25 MG PO TABS
ORAL_TABLET | ORAL | 0 refills | Status: DC
Start: 2020-10-30 — End: 2021-01-21
  Filled 2020-10-30: qty 180, 90d supply, fill #0

## 2020-11-05 NOTE — Progress Notes (Signed)
Electrophysiology Office Note Date: 11/05/2020  ID:  Tyler Hendrix, Tyler Hendrix 08-03-1955, MRN [id number]  PCP: [name], MD Primary Cardiologist: Chrystie Nose, MD Electrophysiologist: Hillis Range, MD   CC: Pacemaker follow-up  Tyler Hendrix is a 65 y.o. male seen today for Hillis Range, MD for routine electrophysiology followup.  Since last being seen in our clinic the patient reports doing well. He has mild SOB when climbing a hill. He "fast walks" for exercise at least once a week. There are two hills in particular that make it difficult to hold a conversation when he's going up them. Otherwise, he is able to do his ADLs without any difficulty. He is satisfied with his current quality of life. Hasn't really "pushed" to see his max exercise tolerance. He rarely has lightheadedness with rapid standing, but this is not significant or limiting. he denies chest pain, palpitations, PND, orthopnea, nausea, vomiting, syncope, edema, weight gain, or early satiety.  Device History: Medtronic dual chamber device 10/2015 -> upgrade to CRT-P 02/08/2019 for CHB / CHF  Past Medical History:  Diagnosis Date   Abnormal TSH    Arthritis    ddd   Atrial fibrillation and flutter (HCC)    s/p DCCV 03/26/2018   CAD in native artery    a. 11/2015: s/p 2 vessel PCI of the first diagonal branch of the LAD in the mid left circumflex with DES. // s/p CABG 01/2018   Chronic combined systolic and diastolic CHF (congestive heart failure) (HCC)    sees Dr. Johney Frame    Complete heart block (HCC)    a. s/p Medtronic ppm 10/2015.   History of radiation therapy 2002   Hodgkin disease (HCC) 2002   a. s/p chest radiation and chemotherapy   Hypothyroidism    sees Dr. Elvera Lennox    Incidental pulmonary nodule 01/19/2018   Left apical opacity - possible scarring   Mitral regurgitation    s/p bioprosthetic MV replacement 01/2018   Pacemaker 10/2015   Medtronic   PVC's (premature ventricular contractions)     Right bundle branch block    S/P CABG x 2 01/27/2018   LIMA to LAD, SVG to RCA, EVH via right thigh   S/P mitral valve replacement with bioprosthetic valve 01/27/2018   29 mm [facility] Mitral stented bovine pericardial tissue valve   Past Surgical History:  Procedure Laterality Date   BIV UPGRADE N/A 02/08/2019   Procedure: UPGRADE TO BIV PPM;  Surgeon: Hillis Range, MD;  Location: [facility] INVASIVE CV LAB;  Service: Cardiovascular;  Laterality: N/A;   CARDIAC CATHETERIZATION N/A 11/02/2015   Procedure: Left Heart Cath and Coronary Angiography;  Surgeon: Tonny Bollman, MD;  Location: [facility] INVASIVE CV LAB;  Service: Cardiovascular;  Laterality: N/A;   CARDIAC CATHETERIZATION N/A 11/21/2015   Procedure: Coronary Stent Intervention;  Surgeon: Tonny Bollman, MD;  Location: [facility] INVASIVE CV LAB;  Service: Cardiovascular;  Laterality: N/A;   CARDIAC CATHETERIZATION  11/2017   CARDIOVERSION N/A 03/26/2018   Procedure: CARDIOVERSION;  Surgeon: Parke Poisson, MD;  Location: [facility] ENDOSCOPY;  Service: Cardiovascular;  Laterality: N/A;   COLONOSCOPY  01/16/2015   per Dr. Christella Hartigan, benign polyps, repeat in 10 yrs    CORONARY ARTERY BYPASS GRAFT N/A 01/27/2018   Procedure: CORONARY ARTERY BYPASS GRAFTING (CABG) x two, using left internal mammary artery and right leg greater saphenous vein harvested endoscopically;  Surgeon: Purcell Nails, MD;  Location: [facility] OR;  Service: Open Heart Surgery;  Laterality: N/A;  EP IMPLANTABLE DEVICE N/A 11/02/2015   MDT Adivisa MRI conditional dual chamber pacemaker implanted by Dr Johney Frame for complete heart block   KNEE SURGERY Right 1972   LUMBAR LAMINECTOMY  1996   MITRAL VALVE REPLACEMENT N/A 01/27/2018   Procedure: MITRAL VALVE (MV) REPLACEMENT using Magna Mitral Ease Valve Size ;  Surgeon: Purcell Nails, MD;  Location: [facility] OR;  Service: Open Heart Surgery;  Laterality: N/A;   RIGHT/LEFT HEART CATH AND CORONARY ANGIOGRAPHY N/A 12/02/2017   Procedure: RIGHT/LEFT  HEART CATH AND CORONARY ANGIOGRAPHY;  Surgeon: Corky Crafts, MD;  Location: [facility] INVASIVE CV LAB;  Service: Cardiovascular;  Laterality: N/A;   TEE WITHOUT CARDIOVERSION N/A 12/09/2017   Procedure: TRANSESOPHAGEAL ECHOCARDIOGRAM (TEE);  Surgeon: Jodelle Red, MD;  Location: [facility] ENDOSCOPY;  Service: Cardiovascular;  Laterality: N/A;   TEE WITHOUT CARDIOVERSION N/A 01/27/2018   Procedure: TRANSESOPHAGEAL ECHOCARDIOGRAM (TEE);  Surgeon: Purcell Nails, MD;  Location: [facility] OR;  Service: Open Heart Surgery;  Laterality: N/A;    Current Outpatient Medications  Medication Sig Dispense Refill   aspirin EC 81 MG tablet Take 81 mg by mouth daily.     atorvastatin (LIPITOR) 40 MG tablet Take 1 tablet (40 mg total) by mouth daily. 90 tablet 3   carvedilol (COREG) 6.25 MG tablet TAKE 1 TABLET BY MOUTH 2 TIMES DAILY WITH MEALS 180 tablet 0   doxycycline (VIBRA-TABS) 100 MG tablet Take 1 tablet (100 mg total) by mouth 2 (two) times daily as needed with food (sun warning) 60 tablet 1   levothyroxine (SYNTHROID) 75 MCG tablet TAKE 1 TABLET BY MOUTH DAILY BEFORE BREAKFAST. 90 tablet 3   Multiple Vitamin (MULTIVITAMIN WITH MINERALS) TABS tablet Take 1 tablet by mouth daily.     sacubitril-valsartan (ENTRESTO) 24-26 MG TAKE 1 TABLET BY MOUTH 2 TIMES DAILY 180 tablet 3   sildenafil (VIAGRA) 50 MG tablet Take 1 tablet (50 mg total) by mouth daily as needed for erectile dysfunction. 30 tablet 3   spironolactone (ALDACTONE) 25 MG tablet TAKE 1/2 TABLET BY MOUTH ONCE A DAY 45 tablet 0   traZODone (DESYREL) 50 MG tablet Take 1 tablet (50 mg total) by mouth at bedtime. 90 tablet 3   No current facility-administered medications for this visit.    Allergies:   Other   Social History: Social History   Socioeconomic History   Marital status: Married    Spouse name: Not on file   Number of children: 2   Years of education: Masters   Highest education level: Not on file  Occupational History    Occupation: lawyer    Comment: [employer]  Tobacco Use   Smoking status: Never   Smokeless tobacco: Never  Vaping Use   Vaping Use: Never used  Substance and Sexual Activity   Alcohol use: Yes    Alcohol/week: 5.0 standard drinks    Types: 5 Glasses of wine per week   Drug use: No   Sexual activity: Not on file  Other Topics Concern   Not on file  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  Physical Exam: There were no vitals filed for this visit.   GEN- The patient is well appearing, alert and oriented x 3 today.   HEENT: normocephalic, atraumatic; sclera clear, conjunctiva pink; hearing intact; oropharynx clear; neck supple  Lungs- Clear to ausculation bilaterally, normal work of breathing.  No wheezes, rales, rhonchi Heart- Regular rate and rhythm, no murmurs, rubs or gallops  GI- soft, non-tender, non-distended, bowel sounds present  Extremities- no clubbing or cyanosis. No edema MS- no significant deformity or atrophy Skin- warm and dry, no rash or lesion; PPM pocket well healed Psych- euthymic mood, full affect Neuro- strength and sensation are intact  PPM Interrogation- reviewed in detail today,  See PACEART report  EKG:  EKG is not ordered today.  Recent Labs: 05/18/2020: TSH 3.40 05/24/2020: ALT 25; BUN 15; Creatinine, Ser 1.09; Hemoglobin 14.3; Platelets 272.0; Potassium 4.8;  Sodium 139   Wt Readings from Last 3 Encounters:  09/18/20 191 lb 12.8 oz (87 kg)  05/24/20 190 lb (86.2 kg)  05/18/20 194 lb 6.4 oz (88.2 kg)     Other studies Reviewed: Additional studies/ records that were reviewed today include: Previous EP office notes, Previous remote checks, Most recent labwork.   Assessment and Plan:  1. CHB s/p Medtronic PPM  Normal PPM function See Pace Art report No changes today  2. Chronic systolic dysfunction V-V optimization 01/2020 A-V optimization 09/2020 NYHA II symptoms Volume status stable on exam LV threshold with gradual uptrend. Plan CXR if trends up further.   3. S/p MVR with CABG Stable.   4. Persistent afib NSR by device interrogation On coumadin for CHA2DS2-VASc of at least 3.   Current medicines are reviewed at length with the patient today.    Labs/ tests ordered today include:  Orders Placed This Encounter  Procedures   Basic metabolic panel   CBC    Disposition:   Follow up with  EP MD  in 6 Months    Signed, Luane School  11/05/2020 2:33 PM  [facility] [address] [place] [address] [phone] (office) [phone] (fax)

## 2020-11-06 ENCOUNTER — Other Ambulatory Visit: Payer: Self-pay

## 2020-11-06 ENCOUNTER — Ambulatory Visit (INDEPENDENT_AMBULATORY_CARE_PROVIDER_SITE_OTHER): Payer: 59 | Admitting: Student

## 2020-11-06 ENCOUNTER — Encounter: Payer: Self-pay | Admitting: Student

## 2020-11-06 VITALS — BP 128/68 | HR 74 | Ht 71.0 in | Wt 190.0 lb

## 2020-11-06 DIAGNOSIS — Z952 Presence of prosthetic heart valve: Secondary | ICD-10-CM | POA: Diagnosis not present

## 2020-11-06 DIAGNOSIS — I442 Atrioventricular block, complete: Secondary | ICD-10-CM

## 2020-11-06 DIAGNOSIS — I4819 Other persistent atrial fibrillation: Secondary | ICD-10-CM

## 2020-11-06 DIAGNOSIS — I428 Other cardiomyopathies: Secondary | ICD-10-CM | POA: Diagnosis not present

## 2020-11-06 DIAGNOSIS — Z951 Presence of aortocoronary bypass graft: Secondary | ICD-10-CM | POA: Diagnosis not present

## 2020-11-06 LAB — CBC
Hematocrit: 39.8 % (ref 37.5–51.0)
Hemoglobin: 13.9 g/dL (ref 13.0–17.7)
MCH: 32.6 pg (ref 26.6–33.0)
MCHC: 34.9 g/dL (ref 31.5–35.7)
MCV: 93 fL (ref 79–97)
Platelets: 252 10*3/uL (ref 150–450)
RBC: 4.27 x10E6/uL (ref 4.14–5.80)
RDW: 12.2 % (ref 11.6–15.4)
WBC: 7.8 10*3/uL (ref 3.4–10.8)

## 2020-11-06 LAB — CUP PACEART INCLINIC DEVICE CHECK
Battery Remaining Longevity: 61 mo
Battery Voltage: 2.97 V
Brady Statistic AP VP Percent: 93.96 %
Brady Statistic AP VS Percent: 0.53 %
Brady Statistic AS VP Percent: 4.15 %
Brady Statistic AS VS Percent: 1.36 %
Brady Statistic RA Percent Paced: 95.4 %
Brady Statistic RV Percent Paced: 98.11 %
Date Time Interrogation Session: 20220927092953
Implantable Lead Implant Date: 20170922
Implantable Lead Implant Date: 20170922
Implantable Lead Implant Date: 20201229
Implantable Lead Location: 753858
Implantable Lead Location: 753859
Implantable Lead Location: 753860
Implantable Lead Model: 4598
Implantable Lead Model: 5076
Implantable Lead Model: 5076
Implantable Pulse Generator Implant Date: 20201229
Lead Channel Impedance Value: 2394 Ohm
Lead Channel Impedance Value: 342 Ohm
Lead Channel Impedance Value: 399 Ohm
Lead Channel Impedance Value: 475 Ohm
Lead Channel Impedance Value: 475 Ohm
Lead Channel Impedance Value: 494 Ohm
Lead Channel Impedance Value: 513 Ohm
Lead Channel Impedance Value: 551 Ohm
Lead Channel Impedance Value: 589 Ohm
Lead Channel Impedance Value: 798 Ohm
Lead Channel Impedance Value: 798 Ohm
Lead Channel Impedance Value: 912 Ohm
Lead Channel Impedance Value: 931 Ohm
Lead Channel Impedance Value: 931 Ohm
Lead Channel Pacing Threshold Amplitude: 0.5 V
Lead Channel Pacing Threshold Amplitude: 0.75 V
Lead Channel Pacing Threshold Amplitude: 2.125 V
Lead Channel Pacing Threshold Pulse Width: 0.4 ms
Lead Channel Pacing Threshold Pulse Width: 0.4 ms
Lead Channel Pacing Threshold Pulse Width: 1 ms
Lead Channel Sensing Intrinsic Amplitude: 2.875 mV
Lead Channel Sensing Intrinsic Amplitude: 3.625 mV
Lead Channel Sensing Intrinsic Amplitude: 31.625 mV
Lead Channel Sensing Intrinsic Amplitude: 31.625 mV
Lead Channel Setting Pacing Amplitude: 2 V
Lead Channel Setting Pacing Amplitude: 2.5 V
Lead Channel Setting Pacing Amplitude: 3.5 V
Lead Channel Setting Pacing Pulse Width: 0.4 ms
Lead Channel Setting Pacing Pulse Width: 1 ms
Lead Channel Setting Sensing Sensitivity: 4 mV

## 2020-11-06 LAB — BASIC METABOLIC PANEL
BUN/Creatinine Ratio: 15 (ref 10–24)
BUN: 15 mg/dL (ref 8–27)
CO2: 24 mmol/L (ref 20–29)
Calcium: 9.5 mg/dL (ref 8.6–10.2)
Chloride: 103 mmol/L (ref 96–106)
Creatinine, Ser: 0.97 mg/dL (ref 0.76–1.27)
Glucose: 134 mg/dL — ABNORMAL HIGH (ref 70–99)
Potassium: 4.8 mmol/L (ref 3.5–5.2)
Sodium: 140 mmol/L (ref 134–144)
eGFR: 87 mL/min/{1.73_m2} (ref >59)

## 2020-11-06 NOTE — Patient Instructions (Signed)
Medication Instructions:  Your physician recommends that you continue on your current medications as directed. Please refer to the Current Medication list given to you today.  *If you need a refill on your cardiac medications before your next appointment, please call your pharmacy*   Lab Work: TODAY: BMET, CBC  If you have labs (blood work) drawn today and your tests are completely normal, you will receive your results only by: [place] (if you have MyChart) OR A paper copy in the mail If you have any lab test that is abnormal or we need to change your treatment, we will call you to review the results.  Follow-Up: At [facility], you and your health needs are our priority.  As part of our continuing mission to provide you with exceptional heart care, we have created designated Provider Care Teams.  These Care Teams include your primary Cardiologist (physician) and Advanced Practice Providers (APPs -  Physician Assistants and Nurse Practitioners) who all work together to provide you with the care you need, when you need it.   Your next appointment:   6 month(s)  The format for your next appointment:   In Person  Provider:   You may see [name], MD or one of the following Advanced Practice Providers on your designated Care Team:   Tommye Standard, [place] Legrand Como "[facility]" [place], [place]

## 2020-11-07 LAB — CUP PACEART REMOTE DEVICE CHECK
Battery Remaining Longevity: 55 mo
Battery Voltage: 2.97 V
Brady Statistic AP VP Percent: 75.84 %
Brady Statistic AP VS Percent: 0.77 %
Brady Statistic AS VP Percent: 19.52 %
Brady Statistic AS VS Percent: 3.87 %
Brady Statistic RA Percent Paced: 79.08 %
Brady Statistic RV Percent Paced: 95.36 %
Date Time Interrogation Session: 20220927220155
Implantable Lead Implant Date: 20170922
Implantable Lead Implant Date: 20170922
Implantable Lead Implant Date: 20201229
Implantable Lead Location: 753858
Implantable Lead Location: 753859
Implantable Lead Location: 753860
Implantable Lead Model: 4598
Implantable Lead Model: 5076
Implantable Lead Model: 5076
Implantable Pulse Generator Implant Date: 20201229
Lead Channel Impedance Value: 2356 Ohm
Lead Channel Impedance Value: 323 Ohm
Lead Channel Impedance Value: 399 Ohm
Lead Channel Impedance Value: 456 Ohm
Lead Channel Impedance Value: 456 Ohm
Lead Channel Impedance Value: 475 Ohm
Lead Channel Impedance Value: 513 Ohm
Lead Channel Impedance Value: 532 Ohm
Lead Channel Impedance Value: 570 Ohm
Lead Channel Impedance Value: 741 Ohm
Lead Channel Impedance Value: 779 Ohm
Lead Channel Impedance Value: 893 Ohm
Lead Channel Impedance Value: 893 Ohm
Lead Channel Impedance Value: 912 Ohm
Lead Channel Pacing Threshold Amplitude: 0.5 V
Lead Channel Pacing Threshold Amplitude: 0.625 V
Lead Channel Pacing Threshold Amplitude: 2.125 V
Lead Channel Pacing Threshold Pulse Width: 0.4 ms
Lead Channel Pacing Threshold Pulse Width: 0.4 ms
Lead Channel Pacing Threshold Pulse Width: 1 ms
Lead Channel Sensing Intrinsic Amplitude: 3.625 mV
Lead Channel Sensing Intrinsic Amplitude: 3.625 mV
Lead Channel Sensing Intrinsic Amplitude: 31.625 mV
Lead Channel Sensing Intrinsic Amplitude: 31.625 mV
Lead Channel Setting Pacing Amplitude: 2 V
Lead Channel Setting Pacing Amplitude: 2.5 V
Lead Channel Setting Pacing Amplitude: 3.5 V
Lead Channel Setting Pacing Pulse Width: 0.4 ms
Lead Channel Setting Pacing Pulse Width: 1 ms
Lead Channel Setting Sensing Sensitivity: 4 mV

## 2020-11-08 ENCOUNTER — Ambulatory Visit (INDEPENDENT_AMBULATORY_CARE_PROVIDER_SITE_OTHER): Payer: 59

## 2020-11-08 DIAGNOSIS — I428 Other cardiomyopathies: Secondary | ICD-10-CM | POA: Diagnosis not present

## 2020-11-19 NOTE — Progress Notes (Signed)
Remote pacemaker transmission.   

## 2020-12-03 ENCOUNTER — Other Ambulatory Visit (HOSPITAL_COMMUNITY): Payer: Self-pay

## 2020-12-04 ENCOUNTER — Other Ambulatory Visit (HOSPITAL_COMMUNITY): Payer: Self-pay

## 2021-01-21 ENCOUNTER — Other Ambulatory Visit: Payer: Self-pay | Admitting: Internal Medicine

## 2021-01-21 ENCOUNTER — Other Ambulatory Visit (HOSPITAL_COMMUNITY): Payer: Self-pay

## 2021-01-21 MED FILL — Sacubitril-Valsartan Tab 24-26 MG: ORAL | 90 days supply | Qty: 180 | Fill #2 | Status: AC

## 2021-01-22 ENCOUNTER — Other Ambulatory Visit (HOSPITAL_COMMUNITY): Payer: Self-pay

## 2021-01-22 MED ORDER — SPIRONOLACTONE 25 MG PO TABS
ORAL_TABLET | Freq: Every day | ORAL | 1 refills | Status: DC
  Filled 2021-01-22: qty 45, 90d supply, fill #0
  Filled 2021-04-27: qty 45, 90d supply, fill #1

## 2021-01-22 MED ORDER — CARVEDILOL 6.25 MG PO TABS
ORAL_TABLET | ORAL | 1 refills | Status: DC
Start: 2021-01-22 — End: 2021-07-05
  Filled 2021-01-22: qty 180, 90d supply, fill #0
  Filled 2021-04-27: qty 180, 90d supply, fill #1

## 2021-01-31 ENCOUNTER — Telehealth: Payer: Self-pay | Admitting: Family Medicine

## 2021-01-31 NOTE — Telephone Encounter (Signed)
Patient calling in with respiratory symptoms: Shortness of breath, chest pain, palpitations or other red words send to Triage  Does the patient have a fever over 100, cough, congestion, sore throat, runny nose, lost of taste/smell (please list symptoms that patient has)?cough, chills on wed. Had scratchy throat ,  What date did symptoms start? About 2 days ago (If over 5 days ago, pt may be scheduled for in person visit)  Have you tested for Covid in the last 5 days? Yes   If yes, was it positive [x]  OR negative [] ? If positive in the last 5 days, please schedule virtual visit now. If negative, schedule for an in person OV with the next available provider if PCP has no openings. Please also let patient know they will be tested again (follow the script below)  "you will have to arrive 74mins prior to your appt time to be Covid tested. Please park in back of office at the cone & call [phone] to let the staff know you have arrived. A staff member will meet you at your car to do a rapid covid test. Once the test has resulted you will be notified by phone of your results to determine if appt will remain an in person visit or be converted to a virtual/phone visit. If you arrive less than 108mins before your appt time, your visit will be automatically converted to virtual & any recommended testing will happen AFTER the visit." Pt has virtual with dr fry on 02-01-2021  THINGS TO REMEMBER  If no availability for virtual visit in office,  please schedule another [place] office  If no availability at another [place] office, please instruct patient that they can schedule an evisit or virtual visit through their mychart account. Visits up to 8pm  patients can be seen in office 5 days after positive COVID test

## 2021-02-01 ENCOUNTER — Telehealth (INDEPENDENT_AMBULATORY_CARE_PROVIDER_SITE_OTHER): Payer: 59 | Admitting: Family Medicine

## 2021-02-01 ENCOUNTER — Encounter: Payer: Self-pay | Admitting: Family Medicine

## 2021-02-01 ENCOUNTER — Other Ambulatory Visit (HOSPITAL_COMMUNITY): Payer: Self-pay

## 2021-02-01 DIAGNOSIS — U071 COVID-19: Secondary | ICD-10-CM

## 2021-02-01 MED ORDER — NIRMATRELVIR/RITONAVIR (PAXLOVID)TABLET
3.0000 | ORAL_TABLET | Freq: Two times a day (BID) | ORAL | 0 refills | Status: AC
  Filled 2021-02-01: qty 30, 5d supply, fill #0

## 2021-02-01 NOTE — Progress Notes (Signed)
Subjective:    Patient ID: Tyler Hendrix, male    DOB: 1955/09/15, 65 y.o.   MRN: [id number]  HPI Virtual Visit via Video Note  I connected with the patient on 02/01/21 at  8:15 AM EST by a video enabled telemedicine application and verified that I am speaking with the correct person using two identifiers.  Location patient: home Location provider:work or home office Persons participating in the virtual visit: patient, provider  I discussed the limitations of evaluation and management by telemedicine and the availability of in person appointments. The patient expressed understanding and agreed to proceed.   HPI: Here for a Covid-19 infection. He started feeling bad 4 days ago with chills, body aches, and a dry cough. He tested positive for the virus 3 days ago. Drinking fluids and takinf Nyquil and Dayquil. No NVD.    ROS: See pertinent positives and negatives per HPI.  Past Medical History:  Diagnosis Date   Abnormal TSH    Arthritis    ddd   Atrial fibrillation and flutter (HCC)    s/p DCCV 03/26/2018   CAD in native artery    a. 11/2015: s/p 2 vessel PCI of the first diagonal branch of the LAD in the mid left circumflex with DES. // s/p CABG 01/2018   Chronic combined systolic and diastolic CHF (congestive heart failure) (HCC)    sees Dr. Johney Frame    Complete heart block (HCC)    a. s/p Medtronic ppm 10/2015.   History of radiation therapy 2002   Hodgkin disease (HCC) 2002   a. s/p chest radiation and chemotherapy   Hypothyroidism    sees Dr. 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/27/2018   29 mm [facility]  Mitral stented bovine pericardial tissue valve    Past Surgical History:  Procedure Laterality Date   BIV UPGRADE N/A 02/08/2019   Procedure: UPGRADE TO BIV PPM;  Surgeon: Hillis Range, MD;  Location: [facility] INVASIVE CV LAB;  Service: Cardiovascular;  Laterality: N/A;   CARDIAC CATHETERIZATION N/A 11/02/2015   Procedure: Left Heart Cath and Coronary Angiography;  Surgeon: Tonny Bollman, MD;  Location: [facility] INVASIVE CV LAB;  Service: Cardiovascular;  Laterality: N/A;   CARDIAC CATHETERIZATION N/A 11/21/2015   Procedure: Coronary Stent Intervention;  Surgeon: Tonny Bollman, MD;  Location: [facility] INVASIVE CV LAB;  Service: Cardiovascular;  Laterality: N/A;   CARDIAC CATHETERIZATION  11/2017   CARDIOVERSION N/A 03/26/2018   Procedure: CARDIOVERSION;  Surgeon: Parke Poisson, MD;  Location: [facility] ENDOSCOPY;  Service: Cardiovascular;  Laterality: N/A;   COLONOSCOPY  01/16/2015   per Dr. Christella Hartigan, benign polyps, repeat in 10 yrs    CORONARY ARTERY BYPASS GRAFT N/A 01/27/2018   Procedure: CORONARY ARTERY BYPASS GRAFTING (CABG) x two, using left internal mammary artery and right leg greater saphenous vein harvested endoscopically;  Surgeon: Purcell Nails, MD;  Location: [facility] OR;  Service: Open Heart Surgery;  Laterality: N/A;   EP IMPLANTABLE DEVICE N/A 11/02/2015   MDT Adivisa MRI conditional dual chamber pacemaker implanted by Dr Johney Frame for complete heart block   KNEE  SURGERY Right 1972   LUMBAR LAMINECTOMY  1996   MITRAL VALVE REPLACEMENT N/A 01/27/2018   Procedure: MITRAL VALVE (MV) REPLACEMENT using Magna Mitral Ease Valve Size ;  Surgeon: Purcell Nails, MD;  Location: [facility] OR;  Service: Open Heart Surgery;  Laterality: N/A;   RIGHT/LEFT HEART CATH AND CORONARY ANGIOGRAPHY N/A 12/02/2017   Procedure: RIGHT/LEFT HEART CATH AND CORONARY ANGIOGRAPHY;  Surgeon: Corky Crafts, MD;  Location: [facility] INVASIVE CV LAB;  Service: Cardiovascular;  Laterality: N/A;   TEE WITHOUT CARDIOVERSION N/A 12/09/2017    Procedure: TRANSESOPHAGEAL ECHOCARDIOGRAM (TEE);  Surgeon: Jodelle Red, MD;  Location: [facility] ENDOSCOPY;  Service: Cardiovascular;  Laterality: N/A;   TEE WITHOUT CARDIOVERSION N/A 01/27/2018   Procedure: TRANSESOPHAGEAL ECHOCARDIOGRAM (TEE);  Surgeon: Purcell Nails, MD;  Location: [facility] OR;  Service: Open Heart Surgery;  Laterality: N/A;    Family History  Problem Relation Age of Onset   Cancer Other        prostate father hx   Heart disease Mother    Heart disease Father    Colon cancer Neg Hx      Current Outpatient Medications:    aspirin EC 81 MG tablet, Take 81 mg by mouth daily., Disp: , Rfl:    atorvastatin (LIPITOR) 40 MG tablet, Take 1 tablet (40 mg total) by mouth daily., Disp: 90 tablet, Rfl: 3   carvedilol (COREG) 6.25 MG tablet, TAKE 1 TABLET BY MOUTH 2 TIMES DAILY WITH MEALS, Disp: 180 tablet, Rfl: 1   levothyroxine (SYNTHROID) 75 MCG tablet, TAKE 1 TABLET BY MOUTH DAILY BEFORE BREAKFAST., Disp: 90 tablet, Rfl: 3   Multiple Vitamin (MULTIVITAMIN WITH MINERALS) TABS tablet, Take 1 tablet by mouth daily., Disp: , Rfl:    nirmatrelvir/ritonavir EUA (PAXLOVID) 20 x 150 MG & 10 x 100MG  TABS, Take 3 tablets by mouth 2 (two) times daily for 5 days. (Take nirmatrelvir 150 mg two tablets twice daily for 5 days and ritonavir 100 mg one tablet twice daily for 5 days) Patient GFR is 87, Disp: 30 tablet, Rfl: 0   sacubitril-valsartan (ENTRESTO) 24-26 MG, TAKE 1 TABLET BY MOUTH 2 TIMES DAILY, Disp: 180 tablet, Rfl: 3   sildenafil (VIAGRA) 50 MG tablet, Take 1 tablet (50 mg total) by mouth daily as needed for erectile dysfunction., Disp: 30 tablet, Rfl: 3   spironolactone (ALDACTONE) 25 MG tablet, TAKE 1/2 TABLET BY MOUTH ONCE A DAY, Disp: 45 tablet, Rfl: 1   traZODone (DESYREL) 50 MG tablet, Take 1 tablet (50 mg total) by mouth at bedtime., Disp: 90 tablet, Rfl: 3  EXAM:  VITALS per patient if applicable:  GENERAL: alert, oriented, appears well and in no acute  distress  HEENT: atraumatic, conjunttiva clear, no obvious abnormalities on inspection of external nose and ears  NECK: normal movements of the head and neck  LUNGS: on inspection no signs of respiratory distress, breathing rate appears normal, no obvious gross SOB, gasping or wheezing  CV: no obvious cyanosis  MS: moves all visible extremities without noticeable abnormality  PSYCH/NEURO: pleasant and cooperative, no obvious depression or anxiety, speech and thought processing grossly intact  ASSESSMENT AND PLAN: Covid infection. Treat with 5 days of Paxlovid. He will quarantine for 5 days.  Gershon Crane, MD  Discussed the following assessment and plan:  No diagnosis found.     I discussed the assessment and treatment plan with the patient. The patient was provided an opportunity to ask questions and all were answered. The patient agreed with the plan  and demonstrated an understanding of the instructions.   The patient was advised to call back or seek an in-person evaluation if the symptoms worsen or if the condition fails to improve as anticipated.      Review of Systems     Objective:   Physical Exam        Assessment & Plan:

## 2021-02-07 ENCOUNTER — Ambulatory Visit (INDEPENDENT_AMBULATORY_CARE_PROVIDER_SITE_OTHER): Payer: 59

## 2021-02-07 DIAGNOSIS — I442 Atrioventricular block, complete: Secondary | ICD-10-CM | POA: Diagnosis not present

## 2021-02-07 LAB — CUP PACEART REMOTE DEVICE CHECK
Battery Remaining Longevity: 54 mo
Battery Voltage: 2.96 V
Brady Statistic AP VP Percent: 90.69 %
Brady Statistic AP VS Percent: 0.72 %
Brady Statistic AS VP Percent: 6.41 %
Brady Statistic AS VS Percent: 2.19 %
Brady Statistic RA Percent Paced: 92.88 %
Brady Statistic RV Percent Paced: 97.1 %
Date Time Interrogation Session: 20221229052806
Implantable Lead Implant Date: 20170922
Implantable Lead Implant Date: 20170922
Implantable Lead Implant Date: 20201229
Implantable Lead Location: 753858
Implantable Lead Location: 753859
Implantable Lead Location: 753860
Implantable Lead Model: 4598
Implantable Lead Model: 5076
Implantable Lead Model: 5076
Implantable Pulse Generator Implant Date: 20201229
Lead Channel Impedance Value: 2470 Ohm
Lead Channel Impedance Value: 361 Ohm
Lead Channel Impedance Value: 418 Ohm
Lead Channel Impedance Value: 494 Ohm
Lead Channel Impedance Value: 494 Ohm
Lead Channel Impedance Value: 513 Ohm
Lead Channel Impedance Value: 532 Ohm
Lead Channel Impedance Value: 551 Ohm
Lead Channel Impedance Value: 589 Ohm
Lead Channel Impedance Value: 836 Ohm
Lead Channel Impedance Value: 836 Ohm
Lead Channel Impedance Value: 931 Ohm
Lead Channel Impedance Value: 931 Ohm
Lead Channel Impedance Value: 950 Ohm
Lead Channel Pacing Threshold Amplitude: 0.5 V
Lead Channel Pacing Threshold Amplitude: 0.625 V
Lead Channel Pacing Threshold Amplitude: 2.125 V
Lead Channel Pacing Threshold Pulse Width: 0.4 ms
Lead Channel Pacing Threshold Pulse Width: 0.4 ms
Lead Channel Pacing Threshold Pulse Width: 1 ms
Lead Channel Sensing Intrinsic Amplitude: 31.625 mV
Lead Channel Sensing Intrinsic Amplitude: 31.625 mV
Lead Channel Sensing Intrinsic Amplitude: 4 mV
Lead Channel Sensing Intrinsic Amplitude: 4 mV
Lead Channel Setting Pacing Amplitude: 2 V
Lead Channel Setting Pacing Amplitude: 2.5 V
Lead Channel Setting Pacing Amplitude: 3.5 V
Lead Channel Setting Pacing Pulse Width: 0.4 ms
Lead Channel Setting Pacing Pulse Width: 1 ms
Lead Channel Setting Sensing Sensitivity: 4 mV

## 2021-02-19 NOTE — Progress Notes (Signed)
Remote pacemaker transmission.   

## 2021-03-11 ENCOUNTER — Other Ambulatory Visit (HOSPITAL_COMMUNITY): Payer: Self-pay

## 2021-03-12 ENCOUNTER — Other Ambulatory Visit (HOSPITAL_COMMUNITY): Payer: Self-pay

## 2021-03-12 MED ORDER — CARESTART COVID-19 HOME TEST VI KIT
PACK | 0 refills | Status: DC
  Filled 2021-03-12: qty 4, 4d supply, fill #0

## 2021-03-14 ENCOUNTER — Other Ambulatory Visit (HOSPITAL_COMMUNITY): Payer: Self-pay

## 2021-03-14 MED ORDER — ZOSTER VAC RECOMB ADJUVANTED 50 MCG/0.5ML IM SUSR
INTRAMUSCULAR | 1 refills | Status: DC
  Filled 2021-03-14: qty 0.5, 1d supply, fill #0
  Filled 2021-03-15 – 2021-07-11 (×3): qty 0.5, 1d supply, fill #1

## 2021-03-15 ENCOUNTER — Other Ambulatory Visit (HOSPITAL_COMMUNITY): Payer: Self-pay

## 2021-04-27 ENCOUNTER — Other Ambulatory Visit: Payer: Self-pay | Admitting: Internal Medicine

## 2021-04-27 ENCOUNTER — Other Ambulatory Visit (HOSPITAL_COMMUNITY): Payer: Self-pay

## 2021-04-29 ENCOUNTER — Other Ambulatory Visit (HOSPITAL_COMMUNITY): Payer: Self-pay

## 2021-04-29 MED ORDER — ENTRESTO 24-26 MG PO TABS
1.0000 | ORAL_TABLET | Freq: Two times a day (BID) | ORAL | 1 refills | Status: DC
  Filled 2021-04-29: qty 180, 90d supply, fill #0
  Filled 2021-07-27: qty 180, 90d supply, fill #1

## 2021-04-29 MED ORDER — LEVOTHYROXINE SODIUM 75 MCG PO TABS
ORAL_TABLET | Freq: Every day | ORAL | 0 refills | Status: DC
  Filled 2021-04-29: qty 90, 90d supply, fill #0

## 2021-04-30 ENCOUNTER — Other Ambulatory Visit (HOSPITAL_COMMUNITY): Payer: Self-pay

## 2021-05-09 ENCOUNTER — Ambulatory Visit (INDEPENDENT_AMBULATORY_CARE_PROVIDER_SITE_OTHER): Payer: 59

## 2021-05-09 ENCOUNTER — Telehealth: Payer: Self-pay

## 2021-05-09 ENCOUNTER — Other Ambulatory Visit (HOSPITAL_COMMUNITY): Payer: Self-pay

## 2021-05-09 DIAGNOSIS — I442 Atrioventricular block, complete: Secondary | ICD-10-CM

## 2021-05-09 LAB — CUP PACEART REMOTE DEVICE CHECK
Battery Remaining Longevity: 49 mo
Battery Voltage: 2.95 V
Brady Statistic AP VP Percent: 89.94 %
Brady Statistic AP VS Percent: 0.03 %
Brady Statistic AS VP Percent: 9.65 %
Brady Statistic AS VS Percent: 0.36 %
Brady Statistic RA Percent Paced: 47.69 %
Brady Statistic RV Percent Paced: 98.26 %
Date Time Interrogation Session: 20230329232643
Implantable Lead Implant Date: 20170922
Implantable Lead Implant Date: 20170922
Implantable Lead Implant Date: 20201229
Implantable Lead Location: 753858
Implantable Lead Location: 753859
Implantable Lead Location: 753860
Implantable Lead Model: 4598
Implantable Lead Model: 5076
Implantable Lead Model: 5076
Implantable Pulse Generator Implant Date: 20201229
Lead Channel Impedance Value: 2489 Ohm
Lead Channel Impedance Value: 285 Ohm
Lead Channel Impedance Value: 361 Ohm
Lead Channel Impedance Value: 437 Ohm
Lead Channel Impedance Value: 475 Ohm
Lead Channel Impedance Value: 475 Ohm
Lead Channel Impedance Value: 494 Ohm
Lead Channel Impedance Value: 513 Ohm
Lead Channel Impedance Value: 589 Ohm
Lead Channel Impedance Value: 760 Ohm
Lead Channel Impedance Value: 798 Ohm
Lead Channel Impedance Value: 893 Ohm
Lead Channel Impedance Value: 931 Ohm
Lead Channel Impedance Value: 931 Ohm
Lead Channel Pacing Threshold Amplitude: 0.5 V
Lead Channel Pacing Threshold Amplitude: 0.625 V
Lead Channel Pacing Threshold Amplitude: 2.125 V
Lead Channel Pacing Threshold Pulse Width: 0.4 ms
Lead Channel Pacing Threshold Pulse Width: 0.4 ms
Lead Channel Pacing Threshold Pulse Width: 1 ms
Lead Channel Sensing Intrinsic Amplitude: 4.75 mV
Lead Channel Sensing Intrinsic Amplitude: 4.75 mV
Lead Channel Sensing Intrinsic Amplitude: 7.375 mV
Lead Channel Sensing Intrinsic Amplitude: 7.375 mV
Lead Channel Setting Pacing Amplitude: 2 V
Lead Channel Setting Pacing Amplitude: 2.5 V
Lead Channel Setting Pacing Amplitude: 3.5 V
Lead Channel Setting Pacing Pulse Width: 0.4 ms
Lead Channel Setting Pacing Pulse Width: 1 ms
Lead Channel Setting Sensing Sensitivity: 4 mV

## 2021-05-09 MED ORDER — APIXABAN 5 MG PO TABS
5.0000 mg | ORAL_TABLET | Freq: Two times a day (BID) | ORAL | 3 refills | Status: DC
  Filled 2021-05-09: qty 60, 30d supply, fill #0
  Filled 2021-06-08: qty 60, 30d supply, fill #1
  Filled 2021-07-09: qty 60, 30d supply, fill #2

## 2021-05-09 NOTE — Telephone Encounter (Signed)
LVM for patient to call device clinic back regarding alert for AF with no current Thompson documented/ was on coumadin per Oda Kilts  ?note on 11/06/20.  ? ?[employer]: ?New ongoing AFL since March 27, 2021, ? [facility], AF burden is 47.5% of the time ? ? ?

## 2021-05-09 NOTE — Telephone Encounter (Signed)
The patient was returning [place] call. He states he take a lot of medication and is not sure if he is taking the generic brand of the medications. He would like for the nurse to give him a call back. ?

## 2021-05-09 NOTE — Addendum Note (Signed)
Addended by: Wanda Plump on: 05/09/2021 03:05 PM ? ? Modules accepted: Orders ? ?

## 2021-05-09 NOTE — Telephone Encounter (Signed)
Spoke with patient he is agreeable to starting Eliquis BID prior to OV with SK on 4/6/233 confirmed patient pharmacy [facility].  ?

## 2021-05-09 NOTE — Telephone Encounter (Signed)
Spoke with patient he stated that he was not taking coumadin informed patient of atrial flutter that has been recorded and to be prepared for medication changes coming on 05/16/21 apt with SK patient appreciative of call and voiced understanding  ?

## 2021-05-14 ENCOUNTER — Other Ambulatory Visit (HOSPITAL_COMMUNITY): Payer: Self-pay

## 2021-05-16 ENCOUNTER — Ambulatory Visit (INDEPENDENT_AMBULATORY_CARE_PROVIDER_SITE_OTHER): Payer: 59 | Admitting: Internal Medicine

## 2021-05-16 ENCOUNTER — Encounter: Payer: Self-pay | Admitting: Internal Medicine

## 2021-05-16 VITALS — BP 116/74 | HR 68 | Ht 71.0 in | Wt 191.0 lb

## 2021-05-16 DIAGNOSIS — Z950 Presence of cardiac pacemaker: Secondary | ICD-10-CM | POA: Diagnosis not present

## 2021-05-16 DIAGNOSIS — I442 Atrioventricular block, complete: Secondary | ICD-10-CM

## 2021-05-16 DIAGNOSIS — I4819 Other persistent atrial fibrillation: Secondary | ICD-10-CM

## 2021-05-16 DIAGNOSIS — I428 Other cardiomyopathies: Secondary | ICD-10-CM | POA: Diagnosis not present

## 2021-05-16 NOTE — Progress Notes (Signed)
? ? ? ? ?Patient Care Team: ?Laurey Morale, MD as PCP - General (Family Medicine) ?Pixie Casino, MD as PCP - Cardiology (Cardiology) ?[name], MD as PCP - Electrophysiology (Cardiology) ? ? ?HPI ? ?Tyler Hendrix is a 66 y.o. male status post CRT pacemaker*Dr. JA for complete heart block initially implanted 2017 and upgrade 2020.  He has a history of nonischemic and ischemic cardiomyopathy, with an antecedent history of Hodgkin's disease with chest radiation and chemotherapy ?History of atrial fibrillation and flutter with prior cardioversion  ? ?Coronary artery disease with progression prompted bypass and bioprosthetic mitral valve replacement 2019 ? ?A few weeks ago he has noted some change in exercise tolerance perhaps lightheadedness.  No edema.  No palpitations. ? ?Also has diaphragmatic stimulation.  Has tolerated since his initial implant.  There were some efforts early on to try to remedy this. ? ?DATE TEST EF   ?9/17 Echo   55-60 %   ?10/19 Echo  40%   ?10/19 LHC  LADm-80>>stent; DI-stent-patent; D2 90  CXm-stent patent ?MR2+  ?2/20 Echo  25-30% LVE/ LVH  ?4/21 Echo  40%   ?10/21 Echo  30-35%   ?8/22 Echo  30-35% LVH 29m   ? ?  ?Date Cr K Hgb  ?9/22 0.97 4.8 13.9  ?      ? ?Thromboembolic risk factors ( age -18, HTN-1, Vasc disease -1, CHF-1) for a CHADSVASc Score of =>4 ? ?Records and Results Reviewed  ? ?Past Medical History:  ?Diagnosis Date  ? Abnormal TSH   ? Arthritis   ? ddd  ? At[place]   ? s/p DCCV 03/26/2018  ? CAD in native artery   ? a. 11/2015: s/p 2 vessel PCI of the first diagonal branch of the LAD in the mid left circumflex with DES. // s/p CABG 01/2018  ? Chronic combined systolic and diastolic CHF (congestive heart failure) ([place]   ? sees Dr. ARayann Heman  ? Complete heart block ([place]   ? a. s/p Medtronic ppm 10/2015.  ? History of radiation therapy 2002  ? Hodgkin disease ([place] 2002  ? a. s/p chest radiation and chemotherapy  ? Hypothyroidism   ? sees  Dr. GCruzita Lederer  ? Incidental pulmonary nodule 01/19/2018  ? Left apical opacity - possible scarring  ? Mitral regurgitation   ? s/p bioprosthetic MV replacement 01/2018  ? Pacemaker 10/2015  ? Medtronic  ? PVC's (premature ventricular contractions)   ? Right bundle branch block   ? S/P CABG x 2 01/27/2018  ? LIMA to LAD, SVG to RCA, EVH via right thigh  ? S/P mitral valve replacement with bioprosthetic valve 01/27/2018  ? [facility] bovine [facility]ardial tissue valve  ? ? ?Past Surgical History:  ?Procedure Laterality Date  ? BIV UPGRADE N/A 02/08/2019  ? Procedure: UPGRADE TO BIV PPM;  Surgeon: AThompson Grayer MD;  [place];  Service: Cardiovascular;  Laterality: N/A;  ? CARDIAC CATHETERIZATION N/A 11/02/2015  ? Procedure: Left Heart Cath and Coronary Angiography;  Surgeon: MSherren Mocha MD;  [place];  Service: Cardiovascular;  Laterality: N/A;  ? CARDIAC CATHETERIZATION N/A 11/21/2015  ? Procedure: Coronary Stent Intervention;  Surgeon: MSherren Mocha MD;  [place] [facility];  Service: Cardiovascular;  Laterality: N/A;  ? CARDIAC CATHETERIZATION  11/2017  ? CARDIOVERSION N/A 03/26/2018  ? Procedure: CARDIOVERSION;  Surgeon: AElouise Munroe MD;  [facility];  Service: Cardiovascular;  Laterality: N/A;  ? COLONOSCOPY  01/16/2015  ? per Dr. Ardis Hughs, benign polyps, repeat in 10 yrs   ? CORONARY ARTERY BYPASS GRAFT N/A 01/27/2018  ? Procedure: CORONARY ARTERY BYPASS GRAFTING (CABG) x two, using left internal mammary artery and right leg greater saphenous vein harvested endoscopically;  Surgeon: Rexene Alberts, MD;  Location: [place];  Service: Open Heart Surgery;  Laterality: N/A;  ? EP IMPLANTABLE DEVICE N/A 11/02/2015  ? MDT Adivisa MRI conditional dual chamber pacemaker implanted by Dr Rayann Heman for complete heart block  ? KNEE SURGERY Right 1972  ? LUMBAR LAMINECTOMY  1996  ? MITRAL VALVE REPLACEMENT N/A 01/27/2018  ? Procedure: MITRAL  VALVE (MV) REPLACEMENT using Magna Mitral Ease Valve Size 29MM;  Surgeon: Rexene Alberts, MD;  Location: [place];  Service: Open Heart Surgery;  Laterality: N/A;  ? RIGHT/LEFT HEART CATH AND CORONARY ANGIOGRAPHY N/A 12/02/2017  ? Procedure: RIGHT/LEFT HEART CATH AND CORONARY ANGIOGRAPHY;  Surgeon: Jettie Booze, MD;  Location: [place] CV LAB;  Service: Cardiovascular;  Laterality: N/A;  ? TEE WITHOUT CARDIOVERSION N/A 12/09/2017  ? Procedure: TRANSESOPHAGEAL ECHOCARDIOGRAM (TEE);  Surgeon: Buford Dresser, MD;  Location: [facility] ENDOSCOPY;  Service: Cardiovascular;  Laterality: N/A;  ? TEE WITHOUT CARDIOVERSION N/A 01/27/2018  ? Procedure: TRANSESOPHAGEAL ECHOCARDIOGRAM (TEE);  Surgeon: Rexene Alberts, MD;  Location: [place];  Service: Open Heart Surgery;  Laterality: N/A;  ? ? ?Current Meds  ?Medication Sig  ? apixaban (ELIQUIS) 5 MG TABS tablet Take 1 tablet by mouth 2  times daily.  ? atorvastatin (LIPITOR) 40 MG tablet Take 1 tablet (40 mg total) by mouth daily.  ? carvedilol (COREG) 6.25 MG tablet TAKE 1 TABLET BY MOUTH 2 TIMES DAILY WITH MEALS  ? COVID-19 At Home Antigen Test [facility] COVID-19 HOME TEST) KIT Use as directed within package instructions.  ? levothyroxine (SYNTHROID) 75 MCG tablet TAKE 1 TABLET BY MOUTH DAILY BEFORE BREAKFAST.  ? Multiple Vitamin (MULTIVITAMIN WITH MINERALS) TABS tablet Take 1 tablet by mouth daily.  ? sacubitril-valsartan (ENTRESTO) 24-26 MG TAKE 1 TABLET BY MOUTH 2 TIMES DAILY  ? sildenafil (VIAGRA) 50 MG tablet Take 1 tablet (50 mg total) by mouth daily as needed for erectile dysfunction.  ? spironolactone (ALDACTONE) 25 MG tablet TAKE 1/2 TABLET BY MOUTH ONCE A DAY  ? traZODone (DESYREL) 50 MG tablet Take 1 tablet (50 mg total) by mouth at bedtime.  ? ? ?Allergies  ?Allergen Reactions  ? Other   ?  Pt reported allergic to dust mites that causes of sneezing, runny nose  ? ? ? ? ?Review of Systems negative except from HPI and PMH ? ?Physical Exam ?BP 116/74    Pulse 68   Ht 5' 11"  (1.803 m)   Wt 191 lb (86.6 kg)   SpO2 98%   BMI 26.64 kg/m?  ?Well developed and well nourished in no acute distress ?HENT normal ?E scleral and icterus clear ?Neck Supple ?JVP flat; carotids brisk and full ?Device pocket well healed; without hematoma or erythema.  There is no tethering  ?Clear to ausculation ?Asymmetric musculature in the back with right hemi hypertrophy ?Regular rate and rhythm, no murmurs gallops or rub ?Soft with active bowel sounds ?No clubbing cyanosis  Edema ?Alert and oriented, grossly normal motor and sensory function ?Skin Warm and Dry ? ?ECG ventricular pacing at 73 with an upright QRS lead V1 and negative QRS lead I and what appears to be typical atrial flutter with an atrial  cycle length of about 240 ms which is upright in lead V1 ? ?CrCl cannot be calculated (Patient's most recent lab result is older than the maximum 21 days allowed.). ? ? ?Assessment and  Plan ?Complete heart block ? ?CRT-P-Medtronic ? ?Diaphragmatic stimulation ? ?Atrial flutter-typical ? ?Ischemic heart disease with prior bypass surgery ? ?Mitral valve replacement-bioprosthetic ? ?Left ventricular hypertrophy ? 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 ? ?The patient's device function is normal.  Atrial arrhythmia developed about a month ago.  Efforts were made to try to reprogram the device to avoid diaphragmatic stimulation; unfortunately, however, every lead that capture had diaphragmatic stimulation and the only lead configuration that left as a safety margin at all was is 2: 3 configuration not withstanding a high output of 3 V obviously which will have an impact on longevity.  At time of generator replacement, conduction system pacing would be most appropriate. ? ?He has left ventricular hypertrophy.  This could be related to thoracic radiation valvular heart disease and hypertension.  Unlikely to be HCM ? ?Atrial flutter  requires anticoagulation.  We have discussed the potential benefits and risks.  We also discussed the potential value of primary treatment with catheter ablation so as to be able to discontinue the anticoagulation.

## 2021-05-16 NOTE — Patient Instructions (Signed)
Medication Instructions:  ?Your physician recommends that you continue on your current medications as directed. Please refer to the Current Medication list given to you today. ? ?*If you need a refill on your cardiac medications before your next appointment, please call your pharmacy* ? ? ?Lab Work: ?None ordered. ? ?If you have labs (blood work) drawn today and your tests are completely normal, you will receive your results only by: ?MyChart Message (if you have MyChart) OR ?A paper copy in the mail ?If you have any lab test that is abnormal or we need to change your treatment, we will call you to review the results. ? ? ?Testing/Procedures: ?None ordered. ? ? ? ?Follow-Up: ?At [facility], you and your health needs are our priority.  As part of our continuing mission to provide you with exceptional heart care, we have created designated Provider Care Teams.  These Care Teams include your primary Cardiologist (physician) and Advanced Practice Providers (APPs -  Physician Assistants and Nurse Practitioners) who all work together to provide you with the care you need, when you need it. ? ?We recommend signing up for the patient portal called "MyChart".  Sign up information is provided on this After Visit Summary.  MyChart is used to connect with patients for Virtual Visits (Telemedicine).  Patients are able to view lab/test results, encounter notes, upcoming appointments, etc.  Non-urgent messages can be sent to your provider as well.   ?To learn more about what you can do with MyChart, go to [email or web address].   ? ?Your next appointment:   ?Dr Caryl Comes will talk to Dr Rayann Heman to discuss next steps. ?

## 2021-05-21 NOTE — Progress Notes (Signed)
Remote pacemaker transmission.   

## 2021-05-24 ENCOUNTER — Other Ambulatory Visit (HOSPITAL_COMMUNITY): Payer: Self-pay

## 2021-05-24 ENCOUNTER — Ambulatory Visit (INDEPENDENT_AMBULATORY_CARE_PROVIDER_SITE_OTHER): Payer: 59 | Admitting: Internal Medicine

## 2021-05-24 ENCOUNTER — Encounter: Payer: Self-pay | Admitting: Internal Medicine

## 2021-05-24 VITALS — BP 120/76 | HR 82 | Ht 71.0 in | Wt 189.8 lb

## 2021-05-24 DIAGNOSIS — E039 Hypothyroidism, unspecified: Secondary | ICD-10-CM | POA: Diagnosis not present

## 2021-05-24 LAB — T4, FREE: Free T4: 0.99 ng/dL (ref 0.60–1.60)

## 2021-05-24 LAB — TSH: TSH: 2.49 u[IU]/mL (ref 0.35–5.50)

## 2021-05-24 MED ORDER — LEVOTHYROXINE SODIUM 75 MCG PO TABS
ORAL_TABLET | Freq: Every day | ORAL | 3 refills | Status: DC
  Filled 2021-05-24: qty 90, fill #0
  Filled 2021-07-27: qty 90, 90d supply, fill #0
  Filled 2021-10-24: qty 90, 90d supply, fill #1
  Filled 2022-01-28: qty 90, 90d supply, fill #2
  Filled 2022-04-11: qty 90, 90d supply, fill #3

## 2021-05-24 NOTE — Progress Notes (Signed)
Patient ID: Tyler Hendrix, male   DOB: 1955/08/17, 66 y.o.   MRN: [id number]  ? ?This visit occurred during the SARS-CoV-2 public health emergency.  Safety protocols were in place, including screening questions prior to the visit, additional usage of staff PPE, and extensive cleaning of exam room while observing appropriate contact time as indicated for disinfecting solutions.  ? ?HPI  ?Tyler Hendrix is a 66 y.o.-year-old male, returning for follow-up for hypothyroidism.  Last visit 1 year ago. ? ?Interim history: ?He was dx'ed with Aflutter in 03/2021 detected on his pacemaker report >> will have ablation. He had palpitations, SOB. He was started on Eliquis. ?He denies tremors, dizziness, significant weight loss. ?He had COVID-19 in 01/2021.  He hd Paxlovid. He recovered well. ? ?Reviewed history: ?Pt. has been dx with hypothyroidism in ~2010. He has been found to have a high TSH during investigation for AVB.  ?He started to feel better after starting levothyroxine. ? ?Reviewed his TFTs: ?Lab Results  ?Component Value Date  ? TSH 3.40 05/18/2020  ? TSH 2.84 05/18/2019  ? TSH 4.17 03/30/2019  ? TSH 4.630 (H) 03/27/2018  ? TSH 2.50 05/12/2017  ? TSH 4.10 09/11/2016  ? TSH 5.55 (H) 07/03/2016  ? TSH 5.55 (H) 04/16/2016  ? TSH 9.38 (H) 01/01/2016  ? TSH 9.07 (H) 10/29/2015  ? FREET4 0.84 05/18/2020  ? FREET4 0.86 05/18/2019  ? FREET4 0.92 03/30/2019  ? FREET4 0.96 05/12/2017  ? FREET4 1.14 09/11/2016  ? FREET4 0.79 07/03/2016  ? FREET4 0.87 04/16/2016  ? FREET4 1.0 01/01/2016  ?  ?Pt is on levothyroxine 75 mcg daily, taken: ?- in am (1-3 am) with 1/2 Trazodone tablet  - dinner is at 7-8 pm ?- fasting ?- at least 30 min from b'fast ?- no Ca, Fe, PPIs ?- + MVI and herbal + mineral suplements in pm ?- not on Biotin ? ?Pt denies: ?- feeling nodules in neck ?- hoarseness ?- dysphagia ?- choking ?- SOB with lying down ? ?Of note, he does have a history of radiation treatment in the head and neck area in 2002. ?No FH of  thyroid disease or thyroid cancer.  ?+ herbal + mineral supplements. No Biotin use. No recent steroids use.  ? ?Pt. 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 ?He had RxTx and ChTx in 2002 for Hodgkin Lymphoma. ?He had MV replacement, CABG 01/2018, then admitted with acute on chronic CHF on 03/27/2018. EF 25-30%.   ?He has a history of A. fib and had cardioversions in the past.  Now he has a biventricular pacemaker. 2D Echo 2021: 30-35%, prev. 40% in 2020. ? ?ROS: ?+ see HPI ? ?I reviewed pt's medications, allergies, PMH, social hx, family hx, and changes were documented in the history of present illness. Otherwise, unchanged from my initial visit note. ? ?Past Medical History:  ?Diagnosis Date  ? Abnormal TSH   ? Arthritis   ? ddd  ? Atrial fibrillation and flutter (HCC)   ? s/p DCCV 03/26/2018  ? CAD in native artery   ? a. 11/2015: s/p 2 vessel PCI of the first diagonal branch of the LAD in the mid left circumflex with DES. // s/p CABG 01/2018  ? Chronic combined systolic and diastolic CHF (congestive heart failure) (HCC)   ? sees Dr. Johney Frame   ? Complete heart block (HCC)   ? a. s/p Medtronic ppm 10/2015.  ? History of radiation therapy 2002  ?  Hodgkin disease (HCC) 2002  ? a. s/p chest radiation and chemotherapy  ? Hypothyroidism   ? sees Dr. Elvera Lennox   ? Incidental pulmonary nodule 01/19/2018  ? Left apical opacity - possible scarring  ? Mitral regurgitation   ? s/p bioprosthetic MV replacement 01/2018  ? Pacemaker 10/2015  ? Medtronic  ? PVC's (premature ventricular contractions)   ? Right bundle branch block   ? S/P CABG x 2 01/27/2018  ? LIMA to LAD, SVG to RCA, EVH via right thigh  ? S/P mitral valve replacement with bioprosthetic valve 01/27/2018  ? 29 mm [facility] Mitral stented bovine pericardial tissue valve  ? ?Past Surgical History:  ?Procedure Laterality Date  ? BIV UPGRADE N/A 02/08/2019  ? Procedure: UPGRADE TO BIV PPM;  Surgeon: Hillis Range, MD;  Location: [facility]  INVASIVE CV LAB;  Service: Cardiovascular;  Laterality: N/A;  ? CARDIAC CATHETERIZATION N/A 11/02/2015  ? Procedure: Left Heart Cath and Coronary Angiography;  Surgeon: Tonny Bollman, MD;  Location: [facility] INVASIVE CV LAB;  Service: Cardiovascular;  Laterality: N/A;  ? CARDIAC CATHETERIZATION N/A 11/21/2015  ? Procedure: Coronary Stent Intervention;  Surgeon: Tonny Bollman, MD;  Location: [facility] INVASIVE CV LAB;  Service: Cardiovascular;  Laterality: N/A;  ? CARDIAC CATHETERIZATION  11/2017  ? CARDIOVERSION N/A 03/26/2018  ? Procedure: CARDIOVERSION;  Surgeon: Parke Poisson, MD;  Location: [facility] ENDOSCOPY;  Service: Cardiovascular;  Laterality: N/A;  ? COLONOSCOPY  01/16/2015  ? per Dr. Christella Hartigan, benign polyps, repeat in 10 yrs   ? CORONARY ARTERY BYPASS GRAFT N/A 01/27/2018  ? Procedure: CORONARY ARTERY BYPASS GRAFTING (CABG) x two, using left internal mammary artery and right leg greater saphenous vein harvested endoscopically;  Surgeon: Purcell Nails, MD;  Location: [facility] OR;  Service: Open Heart Surgery;  Laterality: N/A;  ? EP IMPLANTABLE DEVICE N/A 11/02/2015  ? MDT Adivisa MRI conditional dual chamber pacemaker implanted by Dr Johney Frame for complete heart block  ? KNEE SURGERY Right 1972  ? LUMBAR LAMINECTOMY  1996  ? MITRAL VALVE REPLACEMENT N/A 01/27/2018  ? Procedure: MITRAL VALVE (MV) REPLACEMENT using Magna Mitral Ease Valve Size ;  Surgeon: Purcell Nails, MD;  Location: [facility] OR;  Service: Open Heart Surgery;  Laterality: N/A;  ? RIGHT/LEFT HEART CATH AND CORONARY ANGIOGRAPHY N/A 12/02/2017  ? Procedure: RIGHT/LEFT HEART CATH AND CORONARY ANGIOGRAPHY;  Surgeon: Corky Crafts, MD;  Location: [facility] INVASIVE CV LAB;  Service: Cardiovascular;  Laterality: N/A;  ? TEE WITHOUT CARDIOVERSION N/A 12/09/2017  ? Procedure: TRANSESOPHAGEAL ECHOCARDIOGRAM (TEE);  Surgeon: Jodelle Red, MD;  Location: [facility] ENDOSCOPY;  Service: Cardiovascular;  Laterality: N/A;  ? TEE WITHOUT CARDIOVERSION N/A 01/27/2018  ?  Procedure: TRANSESOPHAGEAL ECHOCARDIOGRAM (TEE);  Surgeon: Purcell Nails, MD;  Location: [facility] OR;  Service: Open Heart Surgery;  Laterality: N/A;  ? ?Social History  ? ?Socioeconomic History  ? Marital status: Married  ?  Spouse name: Not on file  ? Number of children: 2  ? Years of education: Masters  ? Highest education level: Not on file  ?Occupational History  ? Occupation: Clinical research associate  ?  Comment: [employer]  ?Tobacco Use  ? Smoking status: Never  ? Smokeless tobacco: Never  ?Vaping Use  ? Vaping Use: Never used  ?Substance and Sexual Activity  ? Alcohol use: Yes  ?  Alcohol/week: 5.0 standard drinks  ?  Types: 5 Glasses of wine per week  ? Drug use: No  ? Sexual activity: Not on file  ?  Other Topics Concern  ? 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 ?Social Determinants of Health  ? ?Financial Resource Strain: Not on file  ?Food Insecurity: Not on file  ?Transportation Needs: Not on file  ?Physical Activity: Not on file  ?Stress: Not on file  ?Social Connections: Not on file  ?Intimate Partner Violence: Not on file  ? ?Current Outpatient Medications on File Prior to Visit  ?Medication Sig Dispense Refill  ? apixaban (ELIQUIS) 5 MG TABS tablet Take 1 tablet by mouth 2  times daily. 60 tablet 3  ? atorvastatin (LIPITOR) 40 MG tablet Take 1 tablet (40 mg total) by mouth daily. 90 tablet 3  ? carvedilol (COREG) 6.25 MG tablet TAKE 1 TABLET BY MOUTH 2 TIMES DAILY WITH MEALS 180 tablet 1  ? COVID-19 At Home Antigen Test [facility] COVID-19 HOME TEST) KIT Use as directed within package instructions. 4 each 0  ? levothyroxine (SYNTHROID) 75 MCG tablet TAKE 1 TABLET BY MOUTH DAILY BEFORE BREAKFAST. 90 tablet 0  ? Multiple Vitamin (MULTIVITAMIN WITH MINERALS) TABS tablet Take 1 tablet by mouth daily.    ? sacubitril-valsartan (ENTRESTO) 24-26 MG TAKE 1 TABLET BY MOUTH 2 TIMES DAILY 180 tablet 1  ? sildenafil (VIAGRA) 50 MG tablet Take 1 tablet (50 mg total) by  mouth daily as needed for erectile dysfunction. 30 tablet 3  ? spironolactone (ALDACTONE) 25 MG tablet TAKE 1/2 TABLET BY MOUTH ONCE A DAY 45 tablet 1  ? traZODone (DESYREL) 50 MG tablet Take 1 tablet (50 mg total) by

## 2021-05-24 NOTE — Patient Instructions (Signed)
Please continue Levothyroxine 75 mcg daily.  Take the thyroid hormone every day, with water, at least 30 minutes before breakfast, separated by at least 4 hours from: - acid reflux medications - calcium - iron - multivitamins  Please stop at the lab.  Please come back for a follow-up appointment in 1 year. 

## 2021-05-29 ENCOUNTER — Telehealth: Payer: Self-pay | Admitting: Internal Medicine

## 2021-05-29 ENCOUNTER — Encounter: Payer: Self-pay | Admitting: Family

## 2021-05-29 ENCOUNTER — Other Ambulatory Visit (HOSPITAL_COMMUNITY): Payer: Self-pay

## 2021-05-29 ENCOUNTER — Ambulatory Visit: Payer: 59 | Admitting: Family

## 2021-05-29 VITALS — BP 140/96 | HR 72 | Temp 99.4°F | Ht 71.0 in | Wt 190.2 lb

## 2021-05-29 DIAGNOSIS — J0140 Acute pansinusitis, unspecified: Secondary | ICD-10-CM

## 2021-05-29 DIAGNOSIS — H671 Otitis media in diseases classified elsewhere, right ear: Secondary | ICD-10-CM

## 2021-05-29 MED ORDER — AMOXICILLIN-POT CLAVULANATE 875-125 MG PO TABS
1.0000 | ORAL_TABLET | Freq: Two times a day (BID) | ORAL | 0 refills | Status: DC
  Filled 2021-05-29: qty 20, 10d supply, fill #0

## 2021-05-29 NOTE — Patient Instructions (Signed)
It was very nice to see you today! ? ?I have sent Augmentin to your pharmacy to treat your sinus and right ear infection. Eat something prior to taking this. ? ?I also recommend: ?Flonase 1 spray each nostril twice a day, 2 sprays twice a day ok for bad symptoms for 1-2 weeks, then reduce to 1 spray twice a day and continue through allergy season to avoid future infections. ?Nasal saline spray (i.e., Simply Saline) or nasal saline lavage (i.e., NeilMed) is recommended as needed and prior to medicated nasal sprays. ? ? ? ?PLEASE NOTE: ? ?If you had any lab tests please let us know if you have not heard back within a few days. You may see your results on MyChart before we have a chance to review them but we will give you a call once they are reviewed by [place]. If we ordered any referrals today, please let us know if you have not heard from their office within the next week.  ? ?

## 2021-05-29 NOTE — Telephone Encounter (Signed)
Pt calling to inquire about date of Ablation that's suppose to be scheduled. Please advise ?

## 2021-05-29 NOTE — Progress Notes (Signed)
? ?Subjective:  ? ? ? Patient ID: Tyler Hendrix, male    DOB: [birth year], 66 y.o.   MRN: [id number] ? ?Chief Complaint  ?Patient presents with  ? Fever  ?  Pt states he had fever and chills which started yesterday.   ? Facial Pain  ?  Pt c/o facial pain and nasal congestion for about 3 days, has tried Nyquil and dayquil, Advil and has helped. Green and yellow drainage from nose.   ? Ear Pain  ?  Pt c/o right ear pain.   ? ? ?HPI: ?Ear pain - pt describes a 2 day history of Right ear pain with fullness. Denies associated hearing loss, discharge, itching, or dizziness. ?Sinusitis: Patient complains of right ear pressure/pain, congestion, facial pain, headache described as frontal, nasal congestion, post nasal drip, sinus pressure, sore throat, and tooth pain, with no fever, chills, night sweats or weight loss. Onset of symptoms was 7 days ago, gradually worsening since that time. He is drinking moderate amounts of fluids.  Past history is significant for no history of pneumonia or bronchitis. Patient is non-smoker. ? ? ?Health Maintenance Due  ?Topic Date Due  ? Hepatitis C Screening  Never done  ? COVID-19 Vaccine (3 - Booster for Moderna series) 07/13/2019  ? Pneumonia Vaccine 18+ Years old (1 - PCV) Never done  ? Zoster Vaccines- Shingrix (2 of 2) 05/09/2021  ? ? ?Past Medical History:  ?Diagnosis Date  ? Abnormal TSH   ? Arthritis   ? ddd  ? Atrial fibrillation and flutter (HCC)   ? s/p DCCV 03/26/2018  ? CAD in native artery   ? a. 11/2015: s/p 2 vessel PCI of the first diagonal branch of the LAD in the mid left circumflex with DES. // s/p CABG 01/2018  ? Chronic combined systolic and diastolic CHF (congestive heart failure) (HCC)   ? sees Dr. Johney Frame   ? Complete heart block (HCC)   ? a. s/p Medtronic ppm 10/2015.  ? History of radiation therapy 2002  ? Hodgkin disease (HCC) 2002  ? a. s/p chest radiation and chemotherapy  ? Hypothyroidism   ? sees Dr. Elvera Lennox   ? Incidental pulmonary nodule 01/19/2018  ? Left  apical opacity - possible scarring  ? Mitral regurgitation   ? s/p bioprosthetic MV replacement 01/2018  ? Pacemaker 10/2015  ? Medtronic  ? PVC's (premature ventricular contractions)   ? Right bundle branch block   ? S/P CABG x 2 01/27/2018  ? LIMA to LAD, SVG to RCA, EVH via right thigh  ? S/P mitral valve replacement with bioprosthetic valve 01/27/2018  ? 29 mm [facility] Mitral stented bovine pericardial tissue valve  ? ? ?Past Surgical History:  ?Procedure Laterality Date  ? BIV UPGRADE N/A 02/08/2019  ? Procedure: UPGRADE TO BIV PPM;  Surgeon: Hillis Range, MD;  Location: [facility] INVASIVE CV LAB;  Service: Cardiovascular;  Laterality: N/A;  ? CARDIAC CATHETERIZATION N/A 11/02/2015  ? Procedure: Left Heart Cath and Coronary Angiography;  Surgeon: Tonny Bollman, MD;  Location[facility] INVASIVE CV LAB;  Service: Cardiovascular;  Laterality: N/A;  ? CARDIAC CATHETERIZATION N/A 11/21/2015  ? Procedure: Coronary Stent Intervention;  Surgeon: Tonny Bollman, MD;  Location: [facility] INVASIVE CV LAB;  Service: Cardiovascular;  Laterality: N/A;  ? CARDIAC CATHETERIZATION  11/2017  ? CARDIOVERSION N/A 03/26/2018  ? Procedure: CARDIOVERSION;  Surgeon: Parke Poisson, MD;  Location: [facility] ENDOSCOPY;  Service: Cardiovascular;  Laterality: N/A;  ? COLONOSCOPY  01/16/2015  ? per  Dr. Christella Hartigan, benign polyps, repeat in 10 yrs   ? CORONARY ARTERY BYPASS GRAFT N/A 01/27/2018  ? Procedure: CORONARY ARTERY BYPASS GRAFTING (CABG) x two, using left internal mammary artery and right leg greater saphenous vein harvested endoscopically;  Surgeon: Purcell Nails, MD;  Location: [facility] OR;  Service: Open Heart Surgery;  Laterality: N/A;  ? EP IMPLANTABLE DEVICE N/A 11/02/2015  ? MDT Adivisa MRI conditional dual chamber pacemaker implanted by Dr Johney Frame for complete heart block  ? KNEE SURGERY Right 1972  ? LUMBAR LAMINECTOMY  1996  ? MITRAL VALVE REPLACEMENT N/A 01/27/2018  ? Procedure: MITRAL VALVE (MV) REPLACEMENT using Magna Mitral Ease Valve Size ;   Surgeon: Purcell Nails, MD;  Location: [facility] OR;  Service: Open Heart Surgery;  Laterality: N/A;  ? RIGHT/LEFT HEART CATH AND CORONARY ANGIOGRAPHY N/A 12/02/2017  ? Procedure: RIGHT/LEFT HEART CATH AND CORONARY ANGIOGRAPHY;  Surgeon: Corky Crafts, MD;  Location: [facility] INVASIVE CV LAB;  Service: Cardiovascular;  Laterality: N/A;  ? TEE WITHOUT CARDIOVERSION N/A 12/09/2017  ? Procedure: TRANSESOPHAGEAL ECHOCARDIOGRAM (TEE);  Surgeon: Jodelle Red, MD;  Location: [facility] ENDOSCOPY;  Service: Cardiovascular;  Laterality: N/A;  ? TEE WITHOUT CARDIOVERSION N/A 01/27/2018  ? Procedure: TRANSESOPHAGEAL ECHOCARDIOGRAM (TEE);  Surgeon: Purcell Nails, MD;  Location: [facility] OR;  Service: Open Heart Surgery;  Laterality: N/A;  ? ? ?Outpatient Medications Prior to Visit  ?Medication Sig Dispense Refill  ? apixaban (ELIQUIS) 5 MG TABS tablet Take 1 tablet by mouth 2  times daily. 60 tablet 3  ? atorvastatin (LIPITOR) 40 MG tablet Take 1 tablet (40 mg total) by mouth daily. 90 tablet 3  ? carvedilol (COREG) 6.25 MG tablet TAKE 1 TABLET BY MOUTH 2 TIMES DAILY WITH MEALS 180 tablet 1  ? COVID-19 At Home Antigen Test [facility] COVID-19 HOME TEST) KIT Use as directed within package instructions. 4 each 0  ? levothyroxine (SYNTHROID) 75 MCG tablet TAKE 1 TABLET BY MOUTH DAILY BEFORE BREAKFAST. 90 tablet 3  ? Multiple Vitamin (MULTIVITAMIN WITH MINERALS) TABS tablet Take 1 tablet by mouth daily.    ? sacubitril-valsartan (ENTRESTO) 24-26 MG TAKE 1 TABLET BY MOUTH 2 TIMES DAILY 180 tablet 1  ? sildenafil (VIAGRA) 50 MG tablet Take 1 tablet (50 mg total) by mouth daily as needed for erectile dysfunction. 30 tablet 3  ? spironolactone (ALDACTONE) 25 MG tablet TAKE 1/2 TABLET BY MOUTH ONCE A DAY 45 tablet 1  ? traZODone (DESYREL) 50 MG tablet Take 1 tablet (50 mg total) by mouth at bedtime. 90 tablet 3  ? Zoster Vaccine Adjuvanted [facility]) injection Inject into the muscle. 0.5 mL 1  ? ?No facility-administered medications prior  to visit.  ? ? ?Allergies  ?Allergen Reactions  ? Other   ?  Pt reported allergic to dust mites that causes of sneezing, runny nose  ? ? ? ?   ?Objective:  ?  ?Physical Exam ?Vitals and nursing note reviewed.  ?Constitutional:   ?   General: He is not in acute distress. ?   Appearance: Normal appearance.  ?HENT:  ?   Head: Normocephalic.  ?   Right Ear: Swelling and tenderness present. No drainage. Tympanic membrane is injected, erythematous and bulging.  ?   Left Ear: Tympanic membrane is injected.  ?   Nose:  ?   Right Sinus: Maxillary sinus tenderness and frontal sinus tenderness present.  ?   Left Sinus: Maxillary sinus tenderness and frontal sinus tenderness present.  ?   Mouth/Throat:  ?  Mouth: Mucous membranes are moist.  ?   Pharynx: Posterior oropharyngeal erythema present. No pharyngeal swelling, oropharyngeal exudate or uvula swelling.  ?Cardiovascular:  ?   Rate and Rhythm: Normal rate and regular rhythm.  ?Pulmonary:  ?   Effort: Pulmonary effort is normal.  ?   Breath sounds: Normal breath sounds.  ?Musculoskeletal:     ?   General: Normal range of motion.  ?   Cervical back: Normal range of motion.  ?Skin: ?   General: Skin is warm and dry.  ?Neurological:  ?   Mental Status: He is alert and oriented to person, place, and time.  ?Psychiatric:     ?   Mood and Affect: Mood normal.  ? ? ?BP (!) 140/96 (BP Location: Left Arm, Patient Position: Sitting, Cuff Size: Large)   Pulse 72   Temp 99.4 ?F (37.4 ?C) (Temporal)   Ht 5\' 11"  (1.803 m)   Wt 190 lb 4 oz (86.3 kg)   SpO2 98%   BMI 26.53 kg/m?  ?Wt Readings from Last 3 Encounters:  ?05/29/21 190 lb 4 oz (86.3 kg)  ?05/24/21 189 lb 12.8 oz (86.1 kg)  ?05/16/21 191 lb (86.6 kg)  ? ? ?   ?Assessment & Plan:  ? ?Problem List Items Addressed This Visit   ?None ?Visit Diagnoses   ? ? Acute non-recurrent pansinusitis    -  Primary  ? Sending Augmentin, advised on use & SE, ok to use OTC antihistamine,  nasal saline spray, Tylenol for pain, drink at least  2L of water qd. ? ?Relevant Medications  ? amoxicillin-clavulanate (AUGMENTIN) 875-125 MG tablet  ? Otitis media of right ear in disease classified elsewhere      ? w/sinusitis, sending Augmentin, can take

## 2021-05-30 ENCOUNTER — Other Ambulatory Visit (HOSPITAL_COMMUNITY): Payer: Self-pay

## 2021-05-30 NOTE — Telephone Encounter (Signed)
Attempted phone call to pt.  OK per Epic to leave detailed voicemail.  Pt advised Dr Caryl Comes has discussed recommendations with Dr Rayann Heman and Dr Lovena Le.  Once a plan is in place we will contact pt with further recommendations.  Pt may contact RN via MyChart message or calling [phone]. ?

## 2021-05-31 ENCOUNTER — Telehealth: Payer: Self-pay

## 2021-05-31 DIAGNOSIS — I483 Typical atrial flutter: Secondary | ICD-10-CM

## 2021-06-03 ENCOUNTER — Encounter: Payer: Self-pay | Admitting: Family

## 2021-06-03 MED ORDER — OFLOXACIN 0.3 % OT SOLN
10.0000 [drp] | Freq: Every day | OTIC | 0 refills | Status: AC
  Filled 2021-06-03: qty 5, 10d supply, fill #0

## 2021-06-03 MED ORDER — PREDNISONE 20 MG PO TABS
ORAL_TABLET | ORAL | 0 refills | Status: DC
  Filled 2021-06-03: qty 7, 6d supply, fill #0

## 2021-06-04 ENCOUNTER — Other Ambulatory Visit (HOSPITAL_COMMUNITY): Payer: Self-pay

## 2021-06-04 DIAGNOSIS — H9221 Otorrhagia, right ear: Secondary | ICD-10-CM | POA: Diagnosis not present

## 2021-06-04 DIAGNOSIS — R42 Dizziness and giddiness: Secondary | ICD-10-CM | POA: Diagnosis not present

## 2021-06-04 MED ORDER — CIPROFLOXACIN HCL 0.3 % OP SOLN
OPHTHALMIC | 0 refills | Status: DC
  Filled 2021-06-04 (×2): qty 5, 17d supply, fill #0

## 2021-06-04 MED ORDER — DEXAMETHASONE SODIUM PHOSPHATE 0.1 % OP SOLN
OPHTHALMIC | 0 refills | Status: DC
  Filled 2021-06-04: qty 5, 25d supply, fill #0

## 2021-06-05 ENCOUNTER — Telehealth: Payer: Self-pay | Admitting: Family Medicine

## 2021-06-05 NOTE — Telephone Encounter (Signed)
Patient states: -Pharmacy states that cipro drops were ordered and ?they do not have that, they will need an alternative. ?

## 2021-06-05 NOTE — Telephone Encounter (Signed)
Pt is scheduled for aflutter ablation on Jun 20, 2021 ? ?Will get labs 4/27 ? ?Work up complete. ?

## 2021-06-05 NOTE — Telephone Encounter (Signed)
I spoke with the pharmacist yesterday who said Tyler Hendrix also contacted his ENT and Dr. Janace Hoard sent over ear drops and I said to go with his orders. Patient needs to follow up with Dr. Janace Hoard. Thanks

## 2021-06-05 NOTE — Telephone Encounter (Signed)
I called and spoke with pt, Pt stated Rx was filled.  ?

## 2021-06-06 ENCOUNTER — Other Ambulatory Visit: Payer: 59

## 2021-06-06 DIAGNOSIS — I483 Typical atrial flutter: Secondary | ICD-10-CM

## 2021-06-06 LAB — CBC WITH DIFFERENTIAL/PLATELET
Basophils Absolute: 0 10*3/uL (ref 0.0–0.2)
Basos: 0 %
EOS (ABSOLUTE): 0.2 10*3/uL (ref 0.0–0.4)
Eos: 2 %
Hematocrit: 42 % (ref 37.5–51.0)
Hemoglobin: 14 g/dL (ref 13.0–17.7)
Lymphocytes Absolute: 1.2 10*3/uL (ref 0.7–3.1)
Lymphs: 11 %
MCH: 32 pg (ref 26.6–33.0)
MCHC: 33.3 g/dL (ref 31.5–35.7)
MCV: 96 fL (ref 79–97)
Monocytes Absolute: 0.6 10*3/uL (ref 0.1–0.9)
Monocytes: 5 %
Neutrophils Absolute: 8.2 10*3/uL — ABNORMAL HIGH (ref 1.4–7.0)
Neutrophils: 82 %
Platelets: 353 10*3/uL (ref 150–450)
RBC: 4.38 x10E6/uL (ref 4.14–5.80)
RDW: 13.4 % (ref 11.6–15.4)
WBC: 10.1 10*3/uL (ref 3.4–10.8)

## 2021-06-06 LAB — BASIC METABOLIC PANEL
BUN/Creatinine Ratio: 18 (ref 10–24)
BUN: 19 mg/dL (ref 8–27)
CO2: 28 mmol/L (ref 20–29)
Calcium: 9.5 mg/dL (ref 8.6–10.2)
Chloride: 103 mmol/L (ref 96–106)
Creatinine, Ser: 1.07 mg/dL (ref 0.76–1.27)
Glucose: 136 mg/dL — ABNORMAL HIGH (ref 70–99)
Potassium: 4.9 mmol/L (ref 3.5–5.2)
Sodium: 137 mmol/L (ref 134–144)
eGFR: 77 mL/min/{1.73_m2} (ref >59)

## 2021-06-10 ENCOUNTER — Other Ambulatory Visit (HOSPITAL_COMMUNITY): Payer: Self-pay

## 2021-06-18 NOTE — Pre-Procedure Instructions (Signed)
Instructed patient on the following items: ?Arrival time 0730 on Thursday5/11 ?Nothing to eat or drink after midnight ?No meds AM of procedure ?Responsible person to drive you home and stay with you for 24 hrs ? ?Have you missed any doses of anti-coagulant Eliquis- hasn't missed any doses ?   ?

## 2021-06-19 ENCOUNTER — Other Ambulatory Visit: Payer: Self-pay | Admitting: Family Medicine

## 2021-06-19 ENCOUNTER — Other Ambulatory Visit (HOSPITAL_COMMUNITY): Payer: Self-pay

## 2021-06-19 DIAGNOSIS — E782 Mixed hyperlipidemia: Secondary | ICD-10-CM

## 2021-06-19 MED ORDER — ATORVASTATIN CALCIUM 40 MG PO TABS
40.0000 mg | ORAL_TABLET | Freq: Every day | ORAL | 0 refills | Status: DC
  Filled 2021-06-19: qty 30, 30d supply, fill #0

## 2021-06-20 ENCOUNTER — Encounter (HOSPITAL_COMMUNITY)
Admission: RE | Disposition: A | Discharge status: Home / Self Care | Payer: 59 | Source: Home / Self Care | Attending: Internal Medicine

## 2021-06-20 ENCOUNTER — Other Ambulatory Visit: Payer: Self-pay

## 2021-06-20 ENCOUNTER — Ambulatory Visit (HOSPITAL_COMMUNITY)
Admission: RE | Admit: 2021-06-20 | Discharge: 2021-06-20 | Disposition: A | Discharge status: Home / Self Care | Payer: 59 | Attending: Internal Medicine | Admitting: Internal Medicine

## 2021-06-20 DIAGNOSIS — I110 Hypertensive heart disease with heart failure: Secondary | ICD-10-CM | POA: Diagnosis not present

## 2021-06-20 DIAGNOSIS — Z951 Presence of aortocoronary bypass graft: Secondary | ICD-10-CM | POA: Diagnosis not present

## 2021-06-20 DIAGNOSIS — I2510 Atherosclerotic heart disease of native coronary artery without angina pectoris: Secondary | ICD-10-CM | POA: Diagnosis not present

## 2021-06-20 DIAGNOSIS — Z950 Presence of cardiac pacemaker: Secondary | ICD-10-CM | POA: Insufficient documentation

## 2021-06-20 DIAGNOSIS — I5042 Chronic combined systolic (congestive) and diastolic (congestive) heart failure: Secondary | ICD-10-CM | POA: Insufficient documentation

## 2021-06-20 DIAGNOSIS — I4891 Unspecified atrial fibrillation: Secondary | ICD-10-CM | POA: Insufficient documentation

## 2021-06-20 DIAGNOSIS — Z7901 Long term (current) use of anticoagulants: Secondary | ICD-10-CM | POA: Insufficient documentation

## 2021-06-20 DIAGNOSIS — I442 Atrioventricular block, complete: Secondary | ICD-10-CM | POA: Insufficient documentation

## 2021-06-20 DIAGNOSIS — I429 Cardiomyopathy, unspecified: Secondary | ICD-10-CM | POA: Diagnosis not present

## 2021-06-20 DIAGNOSIS — I483 Typical atrial flutter: Secondary | ICD-10-CM | POA: Diagnosis not present

## 2021-06-20 DIAGNOSIS — Z952 Presence of prosthetic heart valve: Secondary | ICD-10-CM | POA: Insufficient documentation

## 2021-06-20 HISTORY — PX: A-FLUTTER ABLATION: EP1230

## 2021-06-20 SURGERY — A-FLUTTER ABLATION

## 2021-06-20 MED ORDER — MIDAZOLAM HCL 5 MG/5ML IJ SOLN
INTRAMUSCULAR | Status: AC
  Filled 2021-06-20: qty 5

## 2021-06-20 MED ORDER — HEPARIN SODIUM (PORCINE) 1000 UNIT/ML IJ SOLN
INTRAMUSCULAR | Status: DC | PRN
  Administered 2021-06-20: 1000 [IU] via INTRAVENOUS

## 2021-06-20 MED ORDER — FENTANYL CITRATE (PF) 100 MCG/2ML IJ SOLN
INTRAMUSCULAR | Status: DC | PRN
Start: 2021-06-20 — End: 2021-06-20
  Administered 2021-06-20 (×2): 25 ug via INTRAVENOUS

## 2021-06-20 MED ORDER — SODIUM CHLORIDE 0.9 % IV SOLN: 250.0000 mL | INTRAVENOUS | Status: DC | PRN

## 2021-06-20 MED ORDER — HEPARIN (PORCINE) IN NACL 1000-0.9 UT/500ML-% IV SOLN
INTRAVENOUS | Status: DC | PRN
  Administered 2021-06-20: 500 mL

## 2021-06-20 MED ORDER — HEPARIN (PORCINE) IN NACL 1000-0.9 UT/500ML-% IV SOLN
INTRAVENOUS | Status: AC
  Filled 2021-06-20: qty 500

## 2021-06-20 MED ORDER — HEPARIN SODIUM (PORCINE) 1000 UNIT/ML IJ SOLN
INTRAMUSCULAR | Status: AC
  Filled 2021-06-20: qty 10

## 2021-06-20 MED ORDER — SODIUM CHLORIDE 0.9% FLUSH: 3.0000 mL | Freq: Two times a day (BID) | INTRAVENOUS | Status: DC

## 2021-06-20 MED ORDER — SODIUM CHLORIDE 0.9% FLUSH: 3.0000 mL | INTRAVENOUS | Status: DC | PRN

## 2021-06-20 MED ORDER — SODIUM CHLORIDE 0.9 % IV SOLN: INTRAVENOUS | Status: DC

## 2021-06-20 MED ORDER — BUPIVACAINE HCL (PF) 0.25 % IJ SOLN
INTRAMUSCULAR | Status: AC
  Filled 2021-06-20: qty 30

## 2021-06-20 MED ORDER — FENTANYL CITRATE (PF) 100 MCG/2ML IJ SOLN
INTRAMUSCULAR | Status: AC
Start: 2021-06-20 — End: ?
  Filled 2021-06-20: qty 2

## 2021-06-20 MED ORDER — MIDAZOLAM HCL 5 MG/5ML IJ SOLN
INTRAMUSCULAR | Status: DC | PRN
Start: 2021-06-20 — End: 2021-06-20
  Administered 2021-06-20 (×2): 2 mg via INTRAVENOUS

## 2021-06-20 MED ORDER — BUPIVACAINE HCL (PF) 0.25 % IJ SOLN
INTRAMUSCULAR | Status: DC | PRN
  Administered 2021-06-20: 30 mL

## 2021-06-20 SURGICAL SUPPLY — 10 items
CATH SMTCH THERMOCOOL SF FJ (CATHETERS) ×1 IMPLANT
CATH WEBSTER BI DIR CS D-F CRV (CATHETERS) ×1 IMPLANT
PACK EP LATEX FREE (CUSTOM PROCEDURE TRAY) ×2
PACK EP LF (CUSTOM PROCEDURE TRAY) ×1 IMPLANT
PAD DEFIB RADIO PHYSIO CONN (PAD) ×2 IMPLANT
PATCH CARTO3 (PAD) ×1 IMPLANT
SHEATH PINNACLE 6F 10CM (SHEATH) ×1 IMPLANT
SHEATH PINNACLE 7F 10CM (SHEATH) ×1 IMPLANT
SHEATH PINNACLE 8F 10CM (SHEATH) ×2 IMPLANT
TUBING SMART ABLATE COOLFLOW (TUBING) ×1 IMPLANT

## 2021-06-20 NOTE — H&P (Signed)
?  ?  ?  ?Patient Care Team: ?Laurey Morale, MD as PCP - General (Family Medicine) ?Pixie Casino, MD as PCP - Cardiology (Cardiology) ?[name], MD as PCP - Electrophysiology (Cardiology) ?  ?  ?HPI ?  ?Tyler Hendrix is a 66 y.o. male status post CRT pacemaker*Dr. JA for complete heart block initially implanted 2017 and upgrade 2020.  He has a history of nonischemic and ischemic cardiomyopathy, with an antecedent history of Hodgkin's disease with chest radiation and chemotherapy ?History of atrial fibrillation and flutter with prior cardioversion  ?  ?Coronary artery disease with progression prompted bypass and bioprosthetic mitral valve replacement 2019 ?  ?A few weeks ago he has noted some change in exercise tolerance perhaps lightheadedness.  No edema.  No palpitations. ?  ?Also has diaphragmatic stimulation.  Has tolerated since his initial implant.  There were some efforts early on to try to remedy this. ?  ?DATE TEST EF    ?9/17 Echo   55-60 %    ?10/19 Echo  40%    ?10/19 LHC   LADm-80>>stent; DI-stent-patent; D2 90  CXm-stent patent ?MR2+  ?2/20 Echo  25-30% LVE/ LVH  ?4/21 Echo  40%    ?10/21 Echo  30-35%    ?8/22 Echo  30-35% LVH 21m   ?  ?  ?Date Cr K Hgb  ?9/22 0.97 4.8 13.9  ?         ?  ?Thromboembolic risk factors ( age -49, HTN-1, Vasc disease -1, CHF-1) for a CHADSVASc Score of =>4 ?  ?Records and Results Reviewed  ?  ?    ?Past Medical History:  ?Diagnosis Date  ? Abnormal TSH    ? Arthritis    ?  ddd  ? Atrial fibrillation and flutter ([place]    ?  s/p DCCV 03/26/2018  ? CAD in native artery    ?  a. 11/2015: s/p 2 vessel PCI of the first diagonal branch of the LAD in the mid left circumflex with DES. // s/p CABG 01/2018  ? Chronic combined systolic and diastolic CHF (congestive heart failure) [place]    ?  sees Dr. ARayann Heman  ? Complete heart block ([facility]    ?  a. s/p Medtronic ppm 10/2015.  ? History of radiation therapy 2002  ? Hodgkin disease ([place] 2002  ?  a. s/p chest radiation and  chemotherapy  ? Hypothyroidism    ?  sees Dr. GCruzita Lederer  ? Incidental pulmonary nodule 01/19/2018  ?  Left apical opacity - possible scarring  ? Mitral regurgitation    ?  s/p bioprosthetic MV replacement 01/2018  ? Pacemaker 10/2015  ?  Medtronic  ? PVC's (premature ventricular contractions)    ? Right bundle branch block    ? S/P CABG x 2 01/27/2018  ?  LIMA to LAD, SVG to RCA, EVH via right thigh  ? S/P mitral valve replacement with bioprosthetic valve 01/27/2018  ?  [facility] bovine pericardial tissue valve  ?  ?  ?     ?Past Surgical History:  ?Procedure Laterality Date  ? BIV UPGRADE N/A 02/08/2019  ?  Procedure: UPGRADE TO BIV PPM;  Surgeon: AThompson Grayer MD;  [facility];  Service: Cardiovascular;  Laterality: N/A;  ? CARDIAC CATHETERIZATION N/A 11/02/2015  ?  Procedure: Left Heart Cath and Coronary Angiography;  Surgeon: MSherren Mocha MD;  [place] [facility];  Service: Cardiovascular;  Laterality: N/A;  ? CARDIAC CATHETERIZATION N/A 11/21/2015  ?  Procedure: Coronary Stent Intervention;  Surgeon: Sherren Mocha, MD;  Location: [place] [facility];  Service: Cardiovascular;  Laterality: N/A;  ? CARDIAC CATHETERIZATION   11/2017  ? CARDIOVERSION N/A 03/26/2018  ?  Procedure: CARDIOVERSION;  Surgeon: [name], MD;  Location: [place];  Service: Cardiovascular;  Laterality: N/A;  ? COLONOSCOPY   01/16/2015  ?  per Dr. Ardis Hughs, benign polyps, repeat in 10 yrs   ? CORONARY ARTERY BYPASS GRAFT N/A 01/27/2018  ?  Procedure: CORONARY ARTERY BYPASS GRAFTING (CABG) x two, using left internal mammary artery and right leg greater saphenous vein harvested endoscopically;  Surgeon: Rexene Alberts, MD;  Location: [place];  Service: Open Heart Surgery;  Laterality: N/A;  ? EP IMPLANTABLE DEVICE N/A 11/02/2015  ?  MDT Adivisa MRI conditional dual chamber pacemaker implanted by Dr Rayann Heman for complete heart block  ? KNEE SURGERY Right 1972  ? LUMBAR LAMINECTOMY    1996  ? MITRAL VALVE REPLACEMENT N/A 01/27/2018  ?  Procedure: MITRAL VALVE (MV) REPLACEMENT using Magna Mitral Ease Valve Size 29MM;  Surgeon: Rexene Alberts, MD;  Location: [place];  Service: Open Heart Surgery;  Laterality: N/A;  ? RIGHT/LEFT HEART CATH AND CORONARY ANGIOGRAPHY N/A 12/02/2017  ?  Procedure: RIGHT/LEFT HEART CATH AND CORONARY ANGIOGRAPHY;  Surgeon: Jettie Booze, MD;  Location: [place] [facility];  Service: Cardiovascular;  Laterality: N/A;  ? TEE WITHOUT CARDIOVERSION N/A 12/09/2017  ?  Procedure: TRANSESOPHAGEAL ECHOCARDIOGRAM (TEE);  Surgeon: Buford Dresser, MD;  Location[facility] ENDOSCOPY;  Service: Cardiovascular;  Laterality: N/A;  ? TEE WITHOUT CARDIOVERSION N/A 01/27/2018  ?  Procedure: TRANSESOPHAGEAL ECHOCARDIOGRAM (TEE);  Surgeon: Rexene Alberts, MD;  Location: [place];  Service: Open Heart Surgery;  Laterality: N/A;  ?  ?  ?    ?Current Meds  ?Medication Sig  ? apixaban (ELIQUIS) 5 MG TABS tablet Take 1 tablet by mouth 2  times daily.  ? atorvastatin (LIPITOR) 40 MG tablet Take 1 tablet (40 mg total) by mouth daily.  ? carvedilol (COREG) 6.25 MG tablet TAKE 1 TABLET BY MOUTH 2 TIMES DAILY WITH MEALS  ? COVID-19 At Home Antigen Test [facility] COVID-19 HOME TEST) KIT Use as directed within package instructions.  ? levothyroxine (SYNTHROID) 75 MCG tablet TAKE 1 TABLET BY MOUTH DAILY BEFORE BREAKFAST.  ? Multiple Vitamin (MULTIVITAMIN WITH MINERALS) TABS tablet Take 1 tablet by mouth daily.  ? sacubitril-valsartan (ENTRESTO) 24-26 MG TAKE 1 TABLET BY MOUTH 2 TIMES DAILY  ? sildenafil (VIAGRA) 50 MG tablet Take 1 tablet (50 mg total) by mouth daily as needed for erectile dysfunction.  ? spironolactone (ALDACTONE) 25 MG tablet TAKE 1/2 TABLET BY MOUTH ONCE A DAY  ? traZODone (DESYREL) 50 MG tablet Take 1 tablet (50 mg total) by mouth at bedtime.  ?  ?  ?     ?Allergies  ?Allergen Reactions  ? Other    ?    Pt reported allergic to dust mites that causes of sneezing, runny nose  ?   ?  ?  ?  ?Review of Systems negative except from HPI and PMH ?  ?Physical Exam ?BP 116/74   Pulse 68   Ht 5' 11"  (1.803 m)   Wt 191 lb (86.6 kg)   SpO2 98%   BMI 26.64 kg/m?  ?Well developed and well nourished in no acute distress ?HENT normal ?E scleral and icterus clear ?Neck Supple ?JVP flat;  carotids brisk and full ?Device pocket well healed; without hematoma or erythema.  There is no tethering  ?Clear to ausculation ?Asymmetric musculature in the back with right hemi hypertrophy ?Regular rate and rhythm, no murmurs gallops or rub ?Soft with active bowel sounds ?No clubbing cyanosis  Edema ?Alert and oriented, grossly normal motor and sensory function ?Skin Warm and Dry ?  ?ECG ventricular pacing at 73 with an upright QRS lead V1 and negative QRS lead I and what appears to be typical atrial flutter with an atrial cycle length of about 240 ms which is upright in lead V1 ?  ?CrCl cannot be calculated (Patient's most recent lab result is older than the maximum 21 days allowed.). ?  ?  ?Assessment and  Plan ?Complete heart block ?  ?CRT-P-Medtronic ?  ?Diaphragmatic stimulation ?  ?Atrial flutter-typical ?  ?Ischemic heart disease with prior bypass surgery ?  ?Mitral valve replacement-bioprosthetic ?  ?Left ventricular hypertrophy ?  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  ?  ?The patient's device function is normal.  Atrial arrhythmia developed about a month ago.  Efforts were made to try to reprogram the device to avoid diaphragmatic stimulation; unfortunately, however, every lead that capture had diaphragmatic stimulation and the only lead configuration that left as a safety margin at all was is 2: 3 configuration not withstanding a high output of 3 V obviously which will have an impact on longevity.  At time of generator replacement, conduction system pacing would be most appropriate. ?  ?He has left ventricular hypertrophy.  This could be related  to thoracic radiation valvular heart disease and hypertension.  Unlikely to be HCM ?  ?Atrial flutter requires anticoagulation.  We have discussed the potential benefits and risks.  We also discussed the pote

## 2021-06-20 NOTE — Discharge Instructions (Addendum)
Post procedure care instructions ?No driving for 4 days. No lifting over 5 lbs for 1 week. No vigorous or sexual activity for 1 week. You may return to work/your usual activities on 06/28/21. Keep procedure site clean & dry. If you notice increased pain, swelling, bleeding or pus, call/return!  You may shower after 24 hours, but no soaking in baths/hot tubs/pools for 1 week.  ? ? ?Cardiac Ablation, Care After ? ?This sheet gives you information about how to care for yourself after your procedure. Your health care provider may also give you more specific instructions. If you have problems or questions, contact your health care provider. ?What can I expect after the procedure? ?After the procedure, it is common to have: ?Bruising around your puncture site. ?Tenderness around your puncture site. ?Skipped heartbeats. ?Tiredness (fatigue). ? ?Follow these instructions at home: ?Puncture site care  ?Follow instructions from your health care provider about how to take care of your puncture site. Make sure you: ?If present, leave stitches (sutures), skin glue, or adhesive strips in place. These skin closures may need to stay in place for up to 2 weeks. If adhesive strip edges start to loosen and curl up, you may trim the loose edges. Do not remove adhesive strips completely unless your health care provider tells you to do that. ?If a large square bandage is present, this may be removed 24 hours after surgery.  ?Check your puncture site every day for signs of infection. Check for: ?Redness, swelling, or pain. ?Fluid or blood. If your puncture site starts to bleed, lie down on your back, apply firm pressure to the area, and contact your health care provider. ?Warmth. ?Pus or a bad smell. ?A pea or small marble sized lump at the site is normal and can take up to three months to resolve.  ?Driving ?Do not drive for at least 4 days after your procedure or however long your health care provider recommends. (Do not resume driving  if you have previously been instructed not to drive for other health reasons.) ?Do not drive or use heavy machinery while taking prescription pain medicine. ?Activity ?Avoid activities that take a lot of effort for at least 7 days after your procedure. ?Do not lift anything that is heavier than 5 lb (4.5 kg) for one week.  ?No sexual activity for 1 week.  ?Return to your normal activities as told by your health care provider. Ask your health care provider what activities are safe for you. ?General instructions ?Take over-the-counter and prescription medicines only as told by your health care provider. ?Do not use any products that contain nicotine or tobacco, such as cigarettes and e-cigarettes. If you need help quitting, ask your health care provider. ?You may shower after 24 hours, but Do not take baths, swim, or use a hot tub for 1 week.  ?Do not drink alcohol for 24 hours after your procedure. ?Keep all follow-up visits as told by your health care provider. This is important. ?Contact a health care provider if: ?You have redness, mild swelling, or pain around your puncture site. ?You have fluid or blood coming from your puncture site that stops after applying firm pressure to the area. ?Your puncture site feels warm to the touch. ?You have pus or a bad smell coming from your puncture site. ?You have a fever. ?You have chest pain or discomfort that spreads to your neck, jaw, or arm. ?You are sweating a lot. ?You feel nauseous. ?You have a fast or irregular heartbeat. ?  You have shortness of breath. ?You are dizzy or light-headed and feel the need to lie down. ?You have pain or numbness in the arm or leg closest to your puncture site. ?Get help right away if: ?Your puncture site suddenly swells. ?Your puncture site is bleeding and the bleeding does not stop after applying firm pressure to the area. ?These symptoms may represent a serious problem that is an emergency. Do not wait to see if the symptoms will go away.  Get medical help right away. Call your local emergency services (911 in the [place] Do not drive yourself to the hospital. ?Summary ?After the procedure, it is normal to have bruising and tenderness at the puncture site in your groin, neck, or forearm. ?Check your puncture site every day for signs of infection. ?Get help right away if your puncture site is bleeding and the bleeding does not stop after applying firm pressure to the area. This is a medical emergency. ?This information is not intended to replace advice given to you by your health care provider. Make sure you discuss any questions you have with your health care provider. ?  ?

## 2021-06-20 NOTE — Progress Notes (Signed)
Site area: RT GROIN ?Site Prior to Removal:  Level  0 ?Pressure Applied For:15 MINUTES ?Manual:   YES ?Patient Status During Pull:  AWAKE ?Post Pull Site:  Level 0 ?Post Pull Instructions Given:  YES ?Post Pull Pulses Present:  ?Dressing Applied: YES  ?Bedrest begins @ 12:00 ?Comments: ?  ?

## 2021-06-21 ENCOUNTER — Encounter (HOSPITAL_COMMUNITY): Payer: Self-pay | Admitting: Internal Medicine

## 2021-06-25 ENCOUNTER — Other Ambulatory Visit (HOSPITAL_COMMUNITY): Payer: Self-pay

## 2021-06-25 DIAGNOSIS — H6501 Acute serous otitis media, right ear: Secondary | ICD-10-CM | POA: Diagnosis not present

## 2021-06-25 DIAGNOSIS — H9071 Mixed conductive and sensorineural hearing loss, unilateral, right ear, with unrestricted hearing on the contralateral side: Secondary | ICD-10-CM | POA: Diagnosis not present

## 2021-06-25 MED ORDER — PREDNISONE 10 MG PO TABS
ORAL_TABLET | ORAL | 0 refills | Status: DC
  Filled 2021-06-25: qty 72, 27d supply, fill #0

## 2021-07-02 DIAGNOSIS — H9311 Tinnitus, right ear: Secondary | ICD-10-CM | POA: Diagnosis not present

## 2021-07-02 DIAGNOSIS — H9071 Mixed conductive and sensorineural hearing loss, unilateral, right ear, with unrestricted hearing on the contralateral side: Secondary | ICD-10-CM | POA: Diagnosis not present

## 2021-07-05 ENCOUNTER — Other Ambulatory Visit (HOSPITAL_COMMUNITY): Payer: Self-pay

## 2021-07-05 ENCOUNTER — Ambulatory Visit (INDEPENDENT_AMBULATORY_CARE_PROVIDER_SITE_OTHER): Payer: 59 | Admitting: Family Medicine

## 2021-07-05 ENCOUNTER — Encounter: Payer: Self-pay | Admitting: Family Medicine

## 2021-07-05 VITALS — BP 126/80 | HR 64 | Temp 98.1°F | Ht 71.25 in | Wt 188.0 lb

## 2021-07-05 DIAGNOSIS — Z125 Encounter for screening for malignant neoplasm of prostate: Secondary | ICD-10-CM | POA: Diagnosis not present

## 2021-07-05 DIAGNOSIS — Z0000 Encounter for general adult medical examination without abnormal findings: Secondary | ICD-10-CM

## 2021-07-05 DIAGNOSIS — E782 Mixed hyperlipidemia: Secondary | ICD-10-CM | POA: Diagnosis not present

## 2021-07-05 DIAGNOSIS — Z23 Encounter for immunization: Secondary | ICD-10-CM | POA: Diagnosis not present

## 2021-07-05 LAB — LIPID PANEL
Cholesterol: 138 mg/dL (ref 0–200)
HDL: 49.1 mg/dL (ref >39.00)
LDL Cholesterol: 63 mg/dL (ref 0–99)
NonHDL: 88.6
Total CHOL/HDL Ratio: 3
Triglycerides: 127 mg/dL (ref 0.0–149.0)
VLDL: 25.4 mg/dL (ref 0.0–40.0)

## 2021-07-05 LAB — CBC WITH DIFFERENTIAL/PLATELET
Basophils Absolute: 0 10*3/uL (ref 0.0–0.1)
Basophils Relative: 0.4 % (ref 0.0–3.0)
Eosinophils Absolute: 0.1 10*3/uL (ref 0.0–0.7)
Eosinophils Relative: 1.1 % (ref 0.0–5.0)
HCT: 43.5 % (ref 39.0–52.0)
Hemoglobin: 14.7 g/dL (ref 13.0–17.0)
Lymphocytes Relative: 16.2 % (ref 12.0–46.0)
Lymphs Abs: 2 10*3/uL (ref 0.7–4.0)
MCHC: 33.7 g/dL (ref 30.0–36.0)
MCV: 94.4 fl (ref 78.0–100.0)
Monocytes Absolute: 0.8 10*3/uL (ref 0.1–1.0)
Monocytes Relative: 6.9 % (ref 3.0–12.0)
Neutro Abs: 9.2 10*3/uL — ABNORMAL HIGH (ref 1.4–7.7)
Neutrophils Relative %: 75.4 % (ref 43.0–77.0)
Platelets: 270 10*3/uL (ref 150.0–400.0)
RBC: 4.61 Mil/uL (ref 4.22–5.81)
RDW: 13.9 % (ref 11.5–15.5)
WBC: 12.2 10*3/uL — ABNORMAL HIGH (ref 4.0–10.5)

## 2021-07-05 LAB — HEPATIC FUNCTION PANEL
ALT: 32 U/L (ref 0–53)
AST: 20 U/L (ref 0–37)
Albumin: 4.5 g/dL (ref 3.5–5.2)
Alkaline Phosphatase: 53 U/L (ref 39–117)
Bilirubin, Direct: 0.2 mg/dL (ref 0.0–0.3)
Total Bilirubin: 0.7 mg/dL (ref 0.2–1.2)
Total Protein: 6.8 g/dL (ref 6.0–8.3)

## 2021-07-05 LAB — BASIC METABOLIC PANEL
BUN: 21 mg/dL (ref 6–23)
CO2: 30 mEq/L (ref 19–32)
Calcium: 9.6 mg/dL (ref 8.4–10.5)
Chloride: 101 mEq/L (ref 96–112)
Creatinine, Ser: 1.17 mg/dL (ref 0.40–1.50)
GFR: 65.57 mL/min (ref >60.00)
Glucose, Bld: 88 mg/dL (ref 70–99)
Potassium: 4.3 mEq/L (ref 3.5–5.1)
Sodium: 140 mEq/L (ref 135–145)

## 2021-07-05 LAB — TSH: TSH: 3.95 u[IU]/mL (ref 0.35–5.50)

## 2021-07-05 LAB — HEMOGLOBIN A1C: Hgb A1c MFr Bld: 5.5 % (ref 4.6–6.5)

## 2021-07-05 LAB — PSA: PSA: 3.08 ng/mL (ref 0.10–4.00)

## 2021-07-05 MED ORDER — CARVEDILOL 6.25 MG PO TABS
6.2500 mg | ORAL_TABLET | Freq: Two times a day (BID) | ORAL | 3 refills | Status: DC
Start: 2021-07-05 — End: 2022-08-19
  Filled 2021-07-05: qty 180, fill #0
  Filled 2021-08-10: qty 180, 90d supply, fill #0
  Filled 2021-11-11: qty 180, 90d supply, fill #1
  Filled 2022-02-07 – 2022-02-08 (×2): qty 180, 90d supply, fill #2
  Filled 2022-04-11 – 2022-04-21 (×4): qty 180, 90d supply, fill #3

## 2021-07-05 MED ORDER — ATORVASTATIN CALCIUM 40 MG PO TABS
40.0000 mg | ORAL_TABLET | Freq: Every day | ORAL | 3 refills | Status: DC
  Filled 2021-07-05 – 2021-07-09 (×2): qty 90, 90d supply, fill #0
  Filled 2021-10-17: qty 90, 90d supply, fill #1
  Filled 2022-01-12: qty 90, 90d supply, fill #2
  Filled 2022-04-11: qty 90, 90d supply, fill #3

## 2021-07-05 MED ORDER — SPIRONOLACTONE 25 MG PO TABS
ORAL_TABLET | Freq: Every day | ORAL | 3 refills | Status: DC
Start: 2021-07-05 — End: 2022-07-21
  Filled 2021-07-05: qty 45, fill #0
  Filled 2021-07-27: qty 45, 90d supply, fill #0
  Filled 2021-10-24: qty 45, 90d supply, fill #1
  Filled 2022-01-28: qty 45, 90d supply, fill #2
  Filled 2022-04-11: qty 45, 90d supply, fill #3

## 2021-07-05 MED ORDER — TRAZODONE HCL 50 MG PO TABS
50.0000 mg | ORAL_TABLET | Freq: Every day | ORAL | 3 refills | Status: DC
Start: 2021-07-05 — End: 2022-07-21
  Filled 2021-07-05 – 2021-07-27 (×2): qty 90, 90d supply, fill #0
  Filled 2021-10-24: qty 90, 90d supply, fill #1
  Filled 2022-01-28: qty 90, 90d supply, fill #2
  Filled 2022-04-11: qty 90, 90d supply, fill #3

## 2021-07-05 NOTE — Progress Notes (Signed)
Subjective:    Patient ID: Tyler Hendrix, male    DOB: [birth year], 66 y.o.   MRN: [id number]  HPI Here for a well exam. He had been doing well until the past month or so. He developed sudden hearing loss in the right ear, and he has been seeing Dr. Christia Reading for this. They presume he had a viral infection, and he has been on a Prednisone taper for this. This also gave him vertigo, and he has been seeing PT for that. So far nothing has seemed to help. He also went into atrial fibrillation a few weeks ago, and he had another ablation on 06-20-21 per Dr. Sharrell Ku. This was successful.    Review of Systems  Constitutional: Negative.   HENT:  Positive for hearing loss.   Eyes: Negative.   Respiratory: Negative.    Cardiovascular: Negative.   Gastrointestinal: Negative.   Genitourinary: Negative.   Musculoskeletal: Negative.   Skin: Negative.   Neurological:  Positive for dizziness.  Psychiatric/Behavioral: Negative.        Objective:   Physical Exam Constitutional:      General: He is not in acute distress.    Appearance: Normal appearance. He is well-developed. He is not diaphoretic.  HENT:     Head: Normocephalic and atraumatic.     Right Ear: External ear normal.     Left Ear: External ear normal.     Nose: Nose normal.     Mouth/Throat:     Pharynx: No oropharyngeal exudate.  Eyes:     General: No scleral icterus.       Right eye: No discharge.        Left eye: No discharge.     Conjunctiva/sclera: Conjunctivae normal.     Pupils: Pupils are equal, round, and reactive to light.  Neck:     Thyroid: No thyromegaly.     Vascular: No JVD.     Trachea: No tracheal deviation.  Cardiovascular:     Rate and Rhythm: Normal rate and regular rhythm.     Heart sounds: Normal heart sounds. No murmur heard.   No friction rub. No gallop.  Pulmonary:     Effort: Pulmonary effort is normal. No respiratory distress.     Breath sounds: Normal breath sounds. No wheezing or  rales.  Chest:     Chest wall: No tenderness.  Abdominal:     General: Bowel sounds are normal. There is no distension.     Palpations: Abdomen is soft. There is no mass.     Tenderness: There is no abdominal tenderness. There is no guarding or rebound.  Genitourinary:    Penis: Normal. No tenderness.      Testes: Normal.     Prostate: Normal.     Rectum: Normal. Guaiac result negative.  Musculoskeletal:        General: No tenderness. Normal range of motion.     Cervical back: Neck supple.  Lymphadenopathy:     Cervical: No cervical adenopathy.  Skin:    General: Skin is warm and dry.     Coloration: Skin is not pale.     Findings: No erythema or rash.  Neurological:     Mental Status: He is alert and oriented to person, place, and time.     Cranial Nerves: No cranial nerve deficit.     Motor: No abnormal muscle tone.     Coordination: Coordination normal.     Deep Tendon Reflexes: Reflexes are normal and  symmetric. Reflexes normal.  Psychiatric:        Behavior: Behavior normal.        Thought Content: Thought content normal.        Judgment: Judgment normal.          Assessment & Plan:  Well exam. We discussed diet and exercise. Get fasting labs.  Gershon Crane, MD

## 2021-07-10 ENCOUNTER — Other Ambulatory Visit (HOSPITAL_COMMUNITY): Payer: Self-pay

## 2021-07-11 ENCOUNTER — Other Ambulatory Visit (HOSPITAL_COMMUNITY): Payer: Self-pay

## 2021-07-12 ENCOUNTER — Other Ambulatory Visit (HOSPITAL_COMMUNITY): Payer: Self-pay

## 2021-07-13 ENCOUNTER — Other Ambulatory Visit (HOSPITAL_COMMUNITY): Payer: Self-pay

## 2021-07-18 DIAGNOSIS — H9041 Sensorineural hearing loss, unilateral, right ear, with unrestricted hearing on the contralateral side: Secondary | ICD-10-CM | POA: Diagnosis not present

## 2021-07-18 DIAGNOSIS — H905 Unspecified sensorineural hearing loss: Secondary | ICD-10-CM | POA: Diagnosis not present

## 2021-07-18 DIAGNOSIS — H9311 Tinnitus, right ear: Secondary | ICD-10-CM | POA: Diagnosis not present

## 2021-07-18 DIAGNOSIS — R42 Dizziness and giddiness: Secondary | ICD-10-CM | POA: Diagnosis not present

## 2021-07-22 ENCOUNTER — Ambulatory Visit: Payer: 59 | Admitting: Internal Medicine

## 2021-07-22 DIAGNOSIS — H9311 Tinnitus, right ear: Secondary | ICD-10-CM | POA: Diagnosis not present

## 2021-07-22 DIAGNOSIS — H9041 Sensorineural hearing loss, unilateral, right ear, with unrestricted hearing on the contralateral side: Secondary | ICD-10-CM | POA: Diagnosis not present

## 2021-07-23 DIAGNOSIS — H9311 Tinnitus, right ear: Secondary | ICD-10-CM | POA: Diagnosis not present

## 2021-07-23 DIAGNOSIS — H9041 Sensorineural hearing loss, unilateral, right ear, with unrestricted hearing on the contralateral side: Secondary | ICD-10-CM | POA: Diagnosis not present

## 2021-07-24 DIAGNOSIS — H9041 Sensorineural hearing loss, unilateral, right ear, with unrestricted hearing on the contralateral side: Secondary | ICD-10-CM | POA: Diagnosis not present

## 2021-07-27 ENCOUNTER — Other Ambulatory Visit (HOSPITAL_COMMUNITY): Payer: Self-pay

## 2021-07-29 ENCOUNTER — Ambulatory Visit (INDEPENDENT_AMBULATORY_CARE_PROVIDER_SITE_OTHER): Payer: 59 | Admitting: Internal Medicine

## 2021-07-29 ENCOUNTER — Other Ambulatory Visit (HOSPITAL_COMMUNITY): Payer: Self-pay

## 2021-07-29 ENCOUNTER — Encounter: Payer: Self-pay | Admitting: Internal Medicine

## 2021-07-29 VITALS — BP 122/78 | HR 77 | Ht 71.0 in | Wt 190.8 lb

## 2021-07-29 DIAGNOSIS — I442 Atrioventricular block, complete: Secondary | ICD-10-CM | POA: Diagnosis not present

## 2021-07-29 DIAGNOSIS — Z950 Presence of cardiac pacemaker: Secondary | ICD-10-CM | POA: Diagnosis not present

## 2021-07-29 LAB — CUP PACEART INCLINIC DEVICE CHECK
Battery Remaining Longevity: 104 mo
Battery Voltage: 2.98 V
Brady Statistic AP VP Percent: 94.31 %
Brady Statistic AP VS Percent: 0.06 %
Brady Statistic AS VP Percent: 5.1 %
Brady Statistic AS VS Percent: 0.53 %
Brady Statistic RA Percent Paced: 94.76 %
Brady Statistic RV Percent Paced: 99.41 %
Date Time Interrogation Session: 20230619122825
Implantable Lead Implant Date: 20170922
Implantable Lead Implant Date: 20170922
Implantable Lead Implant Date: 20201229
Implantable Lead Location: 753858
Implantable Lead Location: 753859
Implantable Lead Location: 753860
Implantable Lead Model: 4598
Implantable Lead Model: 5076
Implantable Lead Model: 5076
Implantable Pulse Generator Implant Date: 20201229
Lead Channel Impedance Value: 2489 Ohm
Lead Channel Impedance Value: 304 Ohm
Lead Channel Impedance Value: 380 Ohm
Lead Channel Impedance Value: 437 Ohm
Lead Channel Impedance Value: 437 Ohm
Lead Channel Impedance Value: 456 Ohm
Lead Channel Impedance Value: 494 Ohm
Lead Channel Impedance Value: 513 Ohm
Lead Channel Impedance Value: 532 Ohm
Lead Channel Impedance Value: 741 Ohm
Lead Channel Impedance Value: 741 Ohm
Lead Channel Impedance Value: 836 Ohm
Lead Channel Impedance Value: 855 Ohm
Lead Channel Impedance Value: 874 Ohm
Lead Channel Pacing Threshold Amplitude: 0.5 V
Lead Channel Pacing Threshold Amplitude: 0.625 V
Lead Channel Pacing Threshold Amplitude: 2.125 V
Lead Channel Pacing Threshold Pulse Width: 0.4 ms
Lead Channel Pacing Threshold Pulse Width: 0.4 ms
Lead Channel Pacing Threshold Pulse Width: 1 ms
Lead Channel Sensing Intrinsic Amplitude: 3.75 mV
Lead Channel Sensing Intrinsic Amplitude: 31.625 mV
Lead Channel Sensing Intrinsic Amplitude: 31.625 mV
Lead Channel Sensing Intrinsic Amplitude: 4.25 mV
Lead Channel Setting Pacing Amplitude: 2 V
Lead Channel Setting Pacing Amplitude: 2.5 V
Lead Channel Setting Pacing Pulse Width: 0.4 ms
Lead Channel Setting Sensing Sensitivity: 4 mV

## 2021-07-29 NOTE — Patient Instructions (Addendum)
Medication Instructions:  Your physician recommends that you continue on your current medications as directed. Please refer to the Current Medication list given to you today.  Late addendum:  Verbal order received from Dr. Lovena Le to discontinue Eliquis.  Eliquis removed from med list.  Patient aware.  07/30/2021  Labwork: None ordered.  Testing/Procedures:  Please schedule for Echo / September 2023.    Your physician has requested that you have an echocardiogram. Echocardiography is a painless test that uses sound waves to create images of your heart. It provides your doctor with information about the size and shape of your heart and how well your heart's chambers and valves are working. This procedure takes approximately one hour. There are no restrictions for this procedure.   Follow-Up:  Your physician wants you to follow-up in: October with Cristopher Peru, MD.    Remote monitoring is used to monitor your Pacemaker from home. This monitoring reduces the number of office visits required to check your device to one time per year. It allows [place] to keep an eye on the functioning of your device to ensure it is working properly. You are scheduled for a device check from home on 08/08/2021. You may send your transmission at any time that day. If you have a wireless device, the transmission will be sent automatically. After your physician reviews your transmission, you will receive a postcard with your next transmission date.  Any Other Special Instructions Will Be Listed Below (If Applicable).  If you need a refill on your cardiac medications before your next appointment, please call your pharmacy.   Important Information About Sugar

## 2021-07-29 NOTE — Progress Notes (Addendum)
HPI Tyler Hendrix returns today for followup. He is a pleasant 66 yo man with a h/o chronic systolic heart failure and an EF of 35%, who is s/p Biv PPM insertion with diaghragmatic stimulation. He has done well in the interim. He underwent EP study and catheter ablation of typical atrial flutter several weeks ago. He feels well and notes that with exertion he is improved, particularly when it comes to walking up a hill. No syncope.  Allergies  Allergen Reactions   Other     Pt reported allergic to dust mites that causes of sneezing, runny nose     Current Outpatient Medications  Medication Sig Dispense Refill   apixaban (ELIQUIS) 5 MG TABS tablet Take 1 tablet by mouth 2  times daily. 60 tablet 3   atorvastatin (LIPITOR) 40 MG tablet Take 40 mg by mouth every evening.     atorvastatin (LIPITOR) 40 MG tablet Take 1 tablet by mouth daily 90 tablet 3   carvedilol (COREG) 6.25 MG tablet TAKE 1 TABLET BY MOUTH 2 TIMES DAILY WITH MEALS 180 tablet 3   COVID-19 At Home Antigen Test (CARESTART COVID-19 HOME TEST) KIT Use as directed within package instructions. 4 each 0   levothyroxine (SYNTHROID) 75 MCG tablet TAKE 1 TABLET BY MOUTH DAILY BEFORE BREAKFAST. 90 tablet 3   Multiple Vitamin (MULTIVITAMIN WITH MINERALS) TABS tablet Take 1 tablet by mouth daily.     predniSONE (DELTASONE) 10 MG tablet Take 6 tablets by mouth once daily for 7 days then decrease by one tablet every 2 days. 72 tablet 0   sacubitril-valsartan (ENTRESTO) 24-26 MG TAKE 1 TABLET BY MOUTH 2 TIMES DAILY 180 tablet 1   sildenafil (VIAGRA) 50 MG tablet Take 1 tablet (50 mg total) by mouth daily as needed for erectile dysfunction. 30 tablet 3   spironolactone (ALDACTONE) 25 MG tablet TAKE 1/2 TABLET BY MOUTH ONCE A DAY 45 tablet 3   traZODone (DESYREL) 50 MG tablet Take 1 tablet (50 mg total) by mouth at bedtime. 90 tablet 3   Zoster Vaccine Adjuvanted [facility]) injection Inject into the muscle. 0.5 mL 1   No current  facility-administered medications for this visit.     Past Medical History:  Diagnosis Date   Abnormal TSH    Arthritis    ddd   Atrial fibrillation and flutter ([place])    s/p DCCV 03/26/2018   CAD in native artery    a. 11/2015: s/p 2 vessel PCI of the first diagonal branch of the LAD in the mid left circumflex with DES. // s/p CABG 01/2018   Chronic combined systolic and diastolic CHF (congestive heart failure) ([place])    sees Dr. Rayann Heman    Complete heart block ([place])    a. s/p Medtronic ppm 10/2015.   History of radiation therapy 2002   Hodgkin disease ([place]) 2002   a. s/p chest radiation and chemotherapy   Hypothyroidism    sees Dr. Cruzita Lederer    Incidental pulmonary nodule 01/19/2018   Left apical opacity - possible scarring   Mitral regurgitation    s/p bioprosthetic MV replacement 01/2018   Pacemaker 10/2015   Medtronic   PVC's (premature ventricular contractions)    Right bundle branch block    S/P CABG x 2 01/27/2018   LIMA to LAD, SVG to RCA, EVH via right thigh   S/P mitral valve replacement with bioprosthetic valve 01/27/2018   29 mm [facility] Mitral stented bovine pericardial tissue valve  ROS:   All systems reviewed and negative except as noted in the HPI.   Past Surgical History:  Procedure Laterality Date   A-FLUTTER ABLATION N/A 06/20/2021   Procedure: A-FLUTTER ABLATION;  Surgeon: Evans Lance, MD;  Location: [place] CV LAB;  Service: Cardiovascular;  Laterality: N/A;   BIV UPGRADE N/A 02/08/2019   Procedure: UPGRADE TO BIV PPM;  Surgeon: [name], MD;  Location: [place] CV LAB;  Service: Cardiovascular;  Laterality: N/A;   CARDIAC CATHETERIZATION N/A 11/02/2015   Procedure: Left Heart Cath and Coronary Angiography;  Surgeon: Sherren Mocha, MD;  Location: [place] CV LAB;  Service: Cardiovascular;  Laterality: N/A;   CARDIAC CATHETERIZATION N/A 11/21/2015   Procedure: Coronary Stent Intervention;  Surgeon: Sherren Mocha, MD;  Location:  [place] CV LAB;  Service: Cardiovascular;  Laterality: N/A;   CARDIAC CATHETERIZATION  11/2017   CARDIOVERSION N/A 03/26/2018   Procedure: CARDIOVERSION;  Surgeon: [name], MD;  Location: [facility] ENDOSCOPY;  Service: Cardiovascular;  Laterality: N/A;   COLONOSCOPY  01/16/2015   per Dr. Ardis Hughs, benign polyps, repeat in 10 yrs    CORONARY ARTERY BYPASS GRAFT N/A 01/27/2018   Procedure: CORONARY ARTERY BYPASS GRAFTING (CABG) x two, using left internal mammary artery and right leg greater saphenous vein harvested endoscopically;  Surgeon: Rexene Alberts, MD;  Location: [place];  Service: Open Heart Surgery;  Laterality: N/A;   EP IMPLANTABLE DEVICE N/A 11/02/2015   MDT Adivisa MRI conditional dual chamber pacemaker implanted by Dr Rayann Heman for complete heart block   KNEE SURGERY Right [place]   MITRAL VALVE REPLACEMENT N/A 01/27/2018   Procedure: MITRAL VALVE (MV) REPLACEMENT using Magna Mitral Ease Valve Size 29MM;  Surgeon: Rexene Alberts, MD;  Location: [place];  Service: Open Heart Surgery;  Laterality: N/A;   RIGHT/LEFT HEART CATH AND CORONARY ANGIOGRAPHY N/A 12/02/2017   Procedure: RIGHT/LEFT HEART CATH AND CORONARY ANGIOGRAPHY;  Surgeon: Jettie Booze, MD;  Location: [place] CV LAB;  Service: Cardiovascular;  Laterality: N/A;   TEE WITHOUT CARDIOVERSION N/A 12/09/2017   Procedure: TRANSESOPHAGEAL ECHOCARDIOGRAM (TEE);  Surgeon: Buford Dresser, MD;  Location: [facility] ENDOSCOPY;  Service: Cardiovascular;  Laterality: N/A;   TEE WITHOUT CARDIOVERSION N/A 01/27/2018   Procedure: TRANSESOPHAGEAL ECHOCARDIOGRAM (TEE);  Surgeon: Rexene Alberts, MD;  Location: [place];  Service: Open Heart Surgery;  Laterality: N/A;     Family History  Problem Relation Age of Onset   Cancer Other        prostate father hx   Heart disease Mother    Heart disease Father    Colon cancer Neg Hx      Social History   Socioeconomic History   Marital status: Married     Spouse name: Not on file   Number of children: 2   Years of education: Masters   Highest education level: Not on file  Occupational History   Occupation: lawyer    Comment: [employer]  Tobacco Use   Smoking status: Never   Smokeless tobacco: Never  Vaping Use   Vaping Use: Never used  Substance and Sexual Activity   Alcohol use: Yes    Alcohol/week: 5.0 standard drinks of alcohol    Types: 5 Glasses of wine per week   Drug use: No   Sexual activity: Not on file  Other Topics Concern   Not on file  Social History Narrative   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  in [place] Strain: Not on file  Food Insecurity: Not on file  Transportation Needs: Not on file  Physical Activity: Not on file  Stress: Not on file  Social Connections: Not on file  Intimate Partner Violence: Not on file     BP 122/78   Pulse 77   Ht 5' 11"  (1.803 m)   Wt 190 lb 12.8 oz (86.5 kg)   SpO2 98%   BMI 26.61 kg/m   Physical Exam:  Well appearing NAD HEENT: Unremarkable Neck:  No JVD, no thyromegally Lymphatics:  No adenopathy Back:  No CVA tenderness Lungs:  Clear HEART:  Regular rate rhythm, no murmurs, no rubs, no clicks Abd:  soft, positive bowel sounds, no organomegally, no rebound, no guarding Ext:  2 plus pulses, no edema, no cyanosis, no clubbing Skin:  No rashes no nodules Neuro:  CN II through XII intact, motor grossly intact  EKG - nsr with pacing induced LBBB  DEVICE  Normal device function.  See PaceArt for details. LV lead is off.  Assess/Plan:  Atrial flutter - he is s/p EPS/RFA of atrial flutter. He is maintaining NSR. He will continue his current meds and he can stop his blood thinner. Chronic systolic heart failure -we will recheck his 2D echo in September and I'll plan to see him back in October to discuss treatment options. We may consider upgrading his leads to left bundle area pacing. Diaghragmatic  stim - his symptoms have resolved with discontinuation of his LV pacing. 4. Coags - he has not had any bleeding on eliquis. He can stop this medication.  Carleene Overlie Jocie Meroney,MD

## 2021-07-30 ENCOUNTER — Encounter: Payer: Self-pay | Admitting: Internal Medicine

## 2021-07-30 ENCOUNTER — Other Ambulatory Visit (HOSPITAL_COMMUNITY): Payer: Self-pay

## 2021-07-30 NOTE — Addendum Note (Signed)
Addended by: Willeen Cass A on: 07/30/2021 01:21 PM   Modules accepted: Orders

## 2021-08-08 ENCOUNTER — Ambulatory Visit (INDEPENDENT_AMBULATORY_CARE_PROVIDER_SITE_OTHER): Payer: 59

## 2021-08-08 DIAGNOSIS — I442 Atrioventricular block, complete: Secondary | ICD-10-CM | POA: Diagnosis not present

## 2021-08-10 ENCOUNTER — Other Ambulatory Visit (HOSPITAL_COMMUNITY): Payer: Self-pay

## 2021-08-11 LAB — CUP PACEART REMOTE DEVICE CHECK
Battery Remaining Longevity: 92 mo
Battery Voltage: 2.97 V
Brady Statistic AP VP Percent: 97.13 %
Brady Statistic AP VS Percent: 0.01 %
Brady Statistic AS VP Percent: 2.53 %
Brady Statistic AS VS Percent: 0.33 %
Brady Statistic RA Percent Paced: 97.35 %
Brady Statistic RV Percent Paced: 99.66 %
Date Time Interrogation Session: 20230701131812
Implantable Lead Implant Date: 20170922
Implantable Lead Implant Date: 20170922
Implantable Lead Implant Date: 20201229
Implantable Lead Location: 753858
Implantable Lead Location: 753859
Implantable Lead Location: 753860
Implantable Lead Model: 4598
Implantable Lead Model: 5076
Implantable Lead Model: 5076
Implantable Pulse Generator Implant Date: 20201229
Lead Channel Impedance Value: 2508 Ohm
Lead Channel Impedance Value: 304 Ohm
Lead Channel Impedance Value: 380 Ohm
Lead Channel Impedance Value: 437 Ohm
Lead Channel Impedance Value: 437 Ohm
Lead Channel Impedance Value: 456 Ohm
Lead Channel Impedance Value: 494 Ohm
Lead Channel Impedance Value: 513 Ohm
Lead Channel Impedance Value: 513 Ohm
Lead Channel Impedance Value: 741 Ohm
Lead Channel Impedance Value: 741 Ohm
Lead Channel Impedance Value: 798 Ohm
Lead Channel Impedance Value: 836 Ohm
Lead Channel Impedance Value: 855 Ohm
Lead Channel Pacing Threshold Amplitude: 0.5 V
Lead Channel Pacing Threshold Amplitude: 0.625 V
Lead Channel Pacing Threshold Amplitude: 2.125 V
Lead Channel Pacing Threshold Pulse Width: 0.4 ms
Lead Channel Pacing Threshold Pulse Width: 0.4 ms
Lead Channel Pacing Threshold Pulse Width: 1 ms
Lead Channel Sensing Intrinsic Amplitude: 3 mV
Lead Channel Sensing Intrinsic Amplitude: 3 mV
Lead Channel Sensing Intrinsic Amplitude: 31.625 mV
Lead Channel Sensing Intrinsic Amplitude: 31.625 mV
Lead Channel Setting Pacing Amplitude: 2 V
Lead Channel Setting Pacing Amplitude: 2.5 V
Lead Channel Setting Pacing Pulse Width: 0.4 ms
Lead Channel Setting Sensing Sensitivity: 4 mV

## 2021-08-22 NOTE — Progress Notes (Signed)
Remote pacemaker transmission.   

## 2021-09-10 DIAGNOSIS — C44519 Basal cell carcinoma of skin of other part of trunk: Secondary | ICD-10-CM | POA: Diagnosis not present

## 2021-09-10 DIAGNOSIS — L570 Actinic keratosis: Secondary | ICD-10-CM | POA: Diagnosis not present

## 2021-09-10 DIAGNOSIS — X32XXXD Exposure to sunlight, subsequent encounter: Secondary | ICD-10-CM | POA: Diagnosis not present

## 2021-09-10 DIAGNOSIS — L820 Inflamed seborrheic keratosis: Secondary | ICD-10-CM | POA: Diagnosis not present

## 2021-09-10 DIAGNOSIS — D225 Melanocytic nevi of trunk: Secondary | ICD-10-CM | POA: Diagnosis not present

## 2021-09-10 DIAGNOSIS — Z1283 Encounter for screening for malignant neoplasm of skin: Secondary | ICD-10-CM | POA: Diagnosis not present

## 2021-10-17 ENCOUNTER — Other Ambulatory Visit (HOSPITAL_COMMUNITY): Payer: Self-pay

## 2021-10-24 ENCOUNTER — Other Ambulatory Visit (HOSPITAL_COMMUNITY): Payer: Self-pay

## 2021-10-28 ENCOUNTER — Other Ambulatory Visit (HOSPITAL_COMMUNITY): Payer: Self-pay

## 2021-10-29 ENCOUNTER — Other Ambulatory Visit (HOSPITAL_COMMUNITY): Payer: Self-pay

## 2021-11-01 ENCOUNTER — Ambulatory Visit (HOSPITAL_COMMUNITY): Payer: 59 | Attending: Internal Medicine

## 2021-11-01 DIAGNOSIS — I442 Atrioventricular block, complete: Secondary | ICD-10-CM | POA: Diagnosis not present

## 2021-11-01 DIAGNOSIS — Z950 Presence of cardiac pacemaker: Secondary | ICD-10-CM | POA: Diagnosis not present

## 2021-11-01 MED ORDER — PERFLUTREN LIPID MICROSPHERE
1.0000 mL | INTRAVENOUS | Status: AC | PRN
  Administered 2021-11-01: 1 mL via INTRAVENOUS

## 2021-11-03 ENCOUNTER — Other Ambulatory Visit: Payer: Self-pay | Admitting: Internal Medicine

## 2021-11-04 ENCOUNTER — Other Ambulatory Visit (HOSPITAL_COMMUNITY): Payer: Self-pay

## 2021-11-04 MED ORDER — ENTRESTO 24-26 MG PO TABS
1.0000 | ORAL_TABLET | Freq: Two times a day (BID) | ORAL | 0 refills | Status: DC
  Filled 2021-11-04: qty 180, 90d supply, fill #0

## 2021-11-05 ENCOUNTER — Other Ambulatory Visit (HOSPITAL_COMMUNITY): Payer: Self-pay

## 2021-11-05 LAB — ECHOCARDIOGRAM COMPLETE
Area-P 1/2: 1.42 cm2
MV VTI: 1.31 cm2
S' Lateral: 4.7 cm

## 2021-11-06 DIAGNOSIS — Z760 Encounter for issue of repeat prescription: Secondary | ICD-10-CM | POA: Diagnosis not present

## 2021-11-11 ENCOUNTER — Other Ambulatory Visit (HOSPITAL_COMMUNITY): Payer: Self-pay

## 2021-11-28 ENCOUNTER — Ambulatory Visit: Payer: 59 | Attending: Internal Medicine | Admitting: Internal Medicine

## 2021-11-28 ENCOUNTER — Encounter: Payer: Self-pay | Admitting: Internal Medicine

## 2021-11-28 VITALS — BP 112/70 | HR 62 | Ht 71.0 in | Wt 196.6 lb

## 2021-11-28 DIAGNOSIS — I4891 Unspecified atrial fibrillation: Secondary | ICD-10-CM | POA: Diagnosis not present

## 2021-11-28 DIAGNOSIS — Z950 Presence of cardiac pacemaker: Secondary | ICD-10-CM | POA: Diagnosis not present

## 2021-11-28 DIAGNOSIS — I442 Atrioventricular block, complete: Secondary | ICD-10-CM | POA: Diagnosis not present

## 2021-11-28 DIAGNOSIS — I4892 Unspecified atrial flutter: Secondary | ICD-10-CM

## 2021-11-28 LAB — CUP PACEART INCLINIC DEVICE CHECK
Battery Remaining Longevity: 94 mo
Battery Voltage: 2.97 V
Brady Statistic AP VP Percent: 96.25 %
Brady Statistic AP VS Percent: 0.03 %
Brady Statistic AS VP Percent: 3.19 %
Brady Statistic AS VS Percent: 0.53 %
Brady Statistic RA Percent Paced: 96.64 %
Brady Statistic RV Percent Paced: 99.44 %
Date Time Interrogation Session: 20231019164029
Implantable Lead Implant Date: 20170922
Implantable Lead Implant Date: 20170922
Implantable Lead Implant Date: 20201229
Implantable Lead Location: 753858
Implantable Lead Location: 753859
Implantable Lead Location: 753860
Implantable Lead Model: 4598
Implantable Lead Model: 5076
Implantable Lead Model: 5076
Implantable Pulse Generator Implant Date: 20201229
Lead Channel Impedance Value: 2603 Ohm
Lead Channel Impedance Value: 342 Ohm
Lead Channel Impedance Value: 418 Ohm
Lead Channel Impedance Value: 437 Ohm
Lead Channel Impedance Value: 456 Ohm
Lead Channel Impedance Value: 494 Ohm
Lead Channel Impedance Value: 551 Ohm
Lead Channel Impedance Value: 551 Ohm
Lead Channel Impedance Value: 589 Ohm
Lead Channel Impedance Value: 741 Ohm
Lead Channel Impedance Value: 779 Ohm
Lead Channel Impedance Value: 874 Ohm
Lead Channel Impedance Value: 874 Ohm
Lead Channel Impedance Value: 912 Ohm
Lead Channel Pacing Threshold Amplitude: 0.5 V
Lead Channel Pacing Threshold Amplitude: 0.625 V
Lead Channel Pacing Threshold Amplitude: 2.125 V
Lead Channel Pacing Threshold Pulse Width: 0.4 ms
Lead Channel Pacing Threshold Pulse Width: 0.4 ms
Lead Channel Pacing Threshold Pulse Width: 1 ms
Lead Channel Sensing Intrinsic Amplitude: 3.25 mV
Lead Channel Sensing Intrinsic Amplitude: 4 mV
Lead Channel Sensing Intrinsic Amplitude: 7.125 mV
Lead Channel Sensing Intrinsic Amplitude: 7.125 mV
Lead Channel Setting Pacing Amplitude: 2 V
Lead Channel Setting Pacing Amplitude: 2.5 V
Lead Channel Setting Pacing Pulse Width: 0.4 ms
Lead Channel Setting Sensing Sensitivity: 4 mV

## 2021-11-28 NOTE — Progress Notes (Signed)
HPI [name] returns today for followup. He is a pleasant 66 yo man with a h/o chronic systolic heart failure and an EF of 35%, who is s/p Biv PPM insertion with diaghragmatic stimulation. He has done well in the interim. He underwent EP study and catheter ablation of typical atrial flutter several weeks ago. He feels well and notes that with exertion he is improved, particularly when it comes to walking up a hill. No syncope. His LV lead was turned off due to diaghragmatic stim. His repeat echo demonstrates an EF of 30-35%. He has not had syncope. Review of his implant shows that the lead was not very posterior and he did not think that he got much if any benefit from his LV lead. Allergies  Allergen Reactions   Other     Pt reported allergic to dust mites that causes of sneezing, runny nose     Current Outpatient Medications  Medication Sig Dispense Refill   atorvastatin (LIPITOR) 40 MG tablet Take 1 tablet by mouth daily 90 tablet 3   carvedilol (COREG) 6.25 MG tablet Take 1 tablet (6.25 mg total) by mouth 2 (two) times daily with a meal. 180 tablet 3   levothyroxine (SYNTHROID) 75 MCG tablet TAKE 1 TABLET BY MOUTH DAILY BEFORE BREAKFAST. 90 tablet 3   Multiple Vitamin (MULTIVITAMIN WITH MINERALS) TABS tablet Take 1 tablet by mouth daily.     sacubitril-valsartan (ENTRESTO) 24-26 MG TAKE 1 TABLET BY MOUTH 2 TIMES DAILY 180 tablet 0   sildenafil (VIAGRA) 50 MG tablet Take 1 tablet (50 mg total) by mouth daily as needed for erectile dysfunction. 30 tablet 3   spironolactone (ALDACTONE) 25 MG tablet TAKE 1/2 TABLET BY MOUTH ONCE A DAY 45 tablet 3   traZODone (DESYREL) 50 MG tablet Take 1 tablet (50 mg total) by mouth at bedtime. 90 tablet 3   No current facility-administered medications for this visit.     Past Medical History:  Diagnosis Date   Abnormal TSH    Arthritis    ddd   Atrial fibrillation and flutter ([place])    s/p DCCV 03/26/2018   CAD in native artery    a.  11/2015: s/p 2 vessel PCI of the first diagonal branch of the LAD in the mid left circumflex with DES. // s/p CABG 01/2018   Chronic combined systolic and diastolic CHF (congestive heart failure) ([place])    sees Dr. Rayann Heman    Complete heart block ([place])    a. s/p Medtronic ppm 10/2015.   History of radiation therapy 2002   Hodgkin disease ([place]) 2002   a. s/p chest radiation and chemotherapy   Hypothyroidism    sees Dr. Cruzita Lederer    Incidental pulmonary nodule 01/19/2018   Left apical opacity - possible scarring   Mitral regurgitation    s/p bioprosthetic MV replacement 01/2018   Pacemaker 10/2015   Medtronic   PVC's (premature ventricular contractions)    Right bundle branch block    S/P CABG x 2 01/27/2018   LIMA to LAD, SVG to RCA, EVH via right thigh   S/P mitral valve replacement with bioprosthetic valve 01/27/2018   29 mm [facility] Mitral stented bovine pericardial tissue valve    ROS:   All systems reviewed and negative except as noted in the HPI.   Past Surgical History:  Procedure Laterality Date   A-FLUTTER ABLATION N/A 06/20/2021   Procedure: A-FLUTTER ABLATION;  Surgeon: Evans Lance, MD;  Location: [facility]  INVASIVE CV LAB;  Service: Cardiovascular;  Laterality: N/A;   BIV UPGRADE N/A 02/08/2019   Procedure: UPGRADE TO BIV PPM;  Surgeon: [name], MD;  Location: [place] CV LAB;  Service: Cardiovascular;  Laterality: N/A;   CARDIAC CATHETERIZATION N/A 11/02/2015   Procedure: Left Heart Cath and Coronary Angiography;  Surgeon: Sherren Mocha, MD;  Location: [place] CV LAB;  Service: Cardiovascular;  Laterality: N/A;   CARDIAC CATHETERIZATION N/A 11/21/2015   Procedure: Coronary Stent Intervention;  Surgeon: Sherren Mocha, MD;  Location: [place] CV LAB;  Service: Cardiovascular;  Laterality: N/A;   CARDIAC CATHETERIZATION  11/2017   CARDIOVERSION N/A 03/26/2018   Procedure: CARDIOVERSION;  Surgeon: [name], MD;  Location: [facility] ENDOSCOPY;  Service:  Cardiovascular;  Laterality: N/A;   COLONOSCOPY  01/16/2015   per Dr. Ardis Hughs, benign polyps, repeat in 10 yrs    CORONARY ARTERY BYPASS GRAFT N/A 01/27/2018   Procedure: CORONARY ARTERY BYPASS GRAFTING (CABG) x two, using left internal mammary artery and right leg greater saphenous vein harvested endoscopically;  Surgeon: Rexene Alberts, MD;  Location: [place];  Service: Open Heart Surgery;  Laterality: N/A;   EP IMPLANTABLE DEVICE N/A 11/02/2015   MDT Adivisa MRI conditional dual chamber pacemaker implanted by Dr Rayann Heman for complete heart block   KNEE SURGERY Right [place]   MITRAL VALVE REPLACEMENT N/A 01/27/2018   Procedure: MITRAL VALVE (MV) REPLACEMENT using Magna Mitral Ease Valve Size 29MM;  Surgeon: Rexene Alberts, MD;  Location: [place];  Service: Open Heart Surgery;  Laterality: N/A;   RIGHT/LEFT HEART CATH AND CORONARY ANGIOGRAPHY N/A 12/02/2017   Procedure: RIGHT/LEFT HEART CATH AND CORONARY ANGIOGRAPHY;  Surgeon: Jettie Booze, MD;  Location: [place] CV LAB;  Service: Cardiovascular;  Laterality: N/A;   TEE WITHOUT CARDIOVERSION N/A 12/09/2017   Procedure: TRANSESOPHAGEAL ECHOCARDIOGRAM (TEE);  Surgeon: Buford Dresser, MD;  Location: [facility] ENDOSCOPY;  Service: Cardiovascular;  Laterality: N/A;   TEE WITHOUT CARDIOVERSION N/A 01/27/2018   Procedure: TRANSESOPHAGEAL ECHOCARDIOGRAM (TEE);  Surgeon: Rexene Alberts, MD;  Location: [place];  Service: Open Heart Surgery;  Laterality: N/A;     Family History  Problem Relation Age of Onset   Cancer Other        prostate father hx   Heart disease Mother    Heart disease Father    Colon cancer Neg Hx      Social History   Socioeconomic History   Marital status: Married    Spouse name: Not on file   Number of children: 2   Years of education: Masters   Highest education level: Not on file  Occupational History   Occupation: lawyer    Comment: [employer]   Tobacco Use   Smoking status: Never   Smokeless tobacco: Never  Vaping Use   Vaping Use: Never used  Substance and Sexual Activity   Alcohol use: Yes    Alcohol/week: 5.0 standard drinks of alcohol    Types: 5 Glasses of wine per week   Drug use: No   Sexual activity: Not on file  Other Topics Concern   Not on file  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[place]   Social Determinants of Health   Financial Resource Strain: Not on file  Food Insecurity: Not on file  Transportation Needs: Not on file  Physical Activity: Not on file  Stress: Not on file  Social Connections: Not  on file  Intimate Partner Violence: Not on file     BP 112/70   Pulse 62   Ht '5\' 11"'$  (1.803 m)   Wt 196 lb 9.6 oz (89.2 kg)   SpO2 95%   BMI 27.42 kg/m   Physical Exam:  Well appearing 66 yo man, NAD HEENT: Unremarkable Neck:  No JVD, no thyromegally Lymphatics:  No adenopathy Back:  No CVA tenderness Lungs:  Clear with no wheezes HEART:  Regular rate rhythm, no murmurs, no rubs, no clicks Abd:  soft, positive bowel sounds, no organomegally, no rebound, no guarding Ext:  2 plus pulses, no edema, no cyanosis, no clubbing Skin:  No rashes no nodules Neuro:  CN II through XII intact, motor grossly intact  EKG - nsr with ventricular pacing  DEVICE  Normal device function.  See PaceArt for details.   Assess/Plan:  Chronic systolic heart failure -his symptoms are class 2A. He will undergo watchful waiting. We discussed biv lead removal and insertion and of a new biv device.  CHB - he is s/p PPM insertion. Atrial flutter - he is s/p ablation and not had any additional arrhythmias. Dyslipidemia - he will continue his statin therapy.   Carleene Overlie Peyten Punches,MD

## 2021-11-28 NOTE — Patient Instructions (Signed)
Medication Instructions:  Your physician recommends that you continue on your current medications as directed. Please refer to the Current Medication list given to you today.  *If you need a refill on your cardiac medications before your next appointment, please call your pharmacy*  Lab Work: None ordered.  If you have labs (blood work) drawn today and your tests are completely normal, you will receive your results only by: [place] (if you have MyChart) OR A paper copy in the mail If you have any lab test that is abnormal or we need to change your treatment, we will call you to review the results.  Testing/Procedures: None ordered.  Follow-Up: At [facility] you and your health needs are our priority.  As part of our continuing mission to provide you with exceptional heart care, we have created designated Provider Care Teams.  These Care Teams include your primary Cardiologist (physician) and Advanced Practice Providers (APPs -  Physician Assistants and Nurse Practitioners) who all work together to provide you with the care you need, when you need it.  We recommend signing up for the patient portal called "MyChart".  Sign up information is provided on this After Visit Summary.  MyChart is used to connect with patients for Virtual Visits (Telemedicine).  Patients are able to view lab/test results, encounter notes, upcoming appointments, etc.  Non-urgent messages can be sent to your provider as well.   To learn more about what you can do with MyChart, go to [email or web address].    Your next appointment:   1 year(s)  The format for your next appointment:   In Person  Provider:   Cristopher Peru, MD{or one of the following Advanced Practice Providers on your designated Care Team:   Tommye Standard, [place] Legrand Como "Jonni Sanger" Chalmers Cater, [place]  Remote monitoring is used to monitor your Pacemaker from home. This monitoring reduces the number of office visits required to check your device to  one time per year. It allows [place] to keep an eye on the functioning of your device to ensure it is working properly. You are scheduled for a device check from home on 02/06/22. You may send your transmission at any time that day. If you have a wireless device, the transmission will be sent automatically. After your physician reviews your transmission, you will receive a postcard with your next transmission date.  Important Information About [place]

## 2021-12-19 DIAGNOSIS — H524 Presbyopia: Secondary | ICD-10-CM | POA: Diagnosis not present

## 2021-12-19 DIAGNOSIS — H52203 Unspecified astigmatism, bilateral: Secondary | ICD-10-CM | POA: Diagnosis not present

## 2021-12-19 DIAGNOSIS — H25813 Combined forms of age-related cataract, bilateral: Secondary | ICD-10-CM | POA: Diagnosis not present

## 2021-12-26 ENCOUNTER — Ambulatory Visit: Payer: 59 | Attending: Internal Medicine | Admitting: Internal Medicine

## 2021-12-26 ENCOUNTER — Encounter: Payer: Self-pay | Admitting: Internal Medicine

## 2021-12-26 VITALS — BP 103/61 | HR 81 | Ht 71.0 in | Wt 195.4 lb

## 2021-12-26 DIAGNOSIS — I2510 Atherosclerotic heart disease of native coronary artery without angina pectoris: Secondary | ICD-10-CM | POA: Diagnosis not present

## 2021-12-26 DIAGNOSIS — I442 Atrioventricular block, complete: Secondary | ICD-10-CM

## 2021-12-26 DIAGNOSIS — Z951 Presence of aortocoronary bypass graft: Secondary | ICD-10-CM | POA: Diagnosis not present

## 2021-12-26 DIAGNOSIS — I5022 Chronic systolic (congestive) heart failure: Secondary | ICD-10-CM

## 2021-12-26 DIAGNOSIS — Z952 Presence of prosthetic heart valve: Secondary | ICD-10-CM

## 2021-12-26 DIAGNOSIS — Z950 Presence of cardiac pacemaker: Secondary | ICD-10-CM

## 2021-12-26 NOTE — Progress Notes (Signed)
OFFICE NOTE  Chief Complaint:  No complaints  Primary Care Physician: Laurey Morale, MD  HPI:  Tyler Hendrix is a pleasant 66 year old male who was previously seen by Dr. Acie Fredrickson in September of this past year. He is an add on to my schedule today for acute symptoms of palpitations. He reports over the past several days she's had skipped heartbeats and higher heart rates. He says his heart rate is typically in the 60s however recently has been in the 80s. He's also felt some skipped beats and they were happening 4-5 times a minute. He denies any chest pain or worsening shortness of breath. He's never had a stress test or any coronary disease evaluation. He does have a history of Hodgkin's lymphoma and had chest wall radiation. In September he had an echocardiogram which showed normal systolic function and it was felt that he was doing fairly well. He continued to do exercise which she has recently without any significant symptoms. 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   [name] returns today for follow-up. Overall he is feeling fairly well. He reports his palpitations have improved somewhat but he still feeling him. While wearing the monitor he was noted to have significant ventricular ectopy including isolated PVCs, and ventricular trigeminy and quadrigeminy. His nuclear stress test was negative for ischemia but was not gated. He did have an echo last fall which showed an EF of 55% which is reassuring.   At the pleasure see [name] back in the office today for follow-up. Overall he thinks he is doing much better on the high-dose of metoprolol 25 mg daily. Although he's tolerating this with very few palpitations, he still has some breakthrough in the afternoon. He generally takes the medicine in the morning. His EKG today however does show a low heart rate of 48, with a short  period to sinus rhythm and a competing junctional rhythm. I've had concerns about underlying conduction system disease based on his EKG showing a right bundle branch block and now there is some evidence of a junctional bradycardia. Despite this he has a good energy level, denies chest pain and is able to maintain normal physical activity. His wife recently had their new baby about 2 weeks ago.  06/22/2015  [name] returns today for follow-up. He reports that he is overall done fairly well. He denies any worsening shortness of breath or fatigue. Heart rate remains in the upper 40s although he has very little ventricular ectopy and his EKG today. He denies any significant palpitations. I think we found a balance currently between low-dose beta blocker and his underlying conduction disease. Should he have more symptoms however he may need antiarrhythmic medication. I do not see any features that are concerning for pacemaker at this point. Blood pressure, however remains elevated and not at goal.  10/04/2015  [name] was seen back today in follow-up. He reports a marked improvement in his PVCs however over the past week he's felt slightly more short of breath with exertion. He did not voluntarily mention this yet required significant persistent questioning on my part to get an answer out of him. He also said that he might have a little pressure in his chest when he tried to exercise. He denied any pain, dizziness, presyncope or syncopal symptoms. Blood pressure is improved somewhat after  the addition of amlodipine. He's no longer having any PVCs however his EKG is abnormal today showing a new complete heart block. Heart rate is 44 with a sinus rhythm and wide QRS. There are inferior T-wave abnormalities which were previously seen on his EKG.  10/05/2015  [name] was seen today in follow-up of complete heart block. This was noted yesterday during his office visit. Heart rate was in the 40s and he was  somewhat diaphoretic. He denied any chest pain or shortness of breath but has felt fatigue recently. He had taken Toprol XL the night before and I advised him to not take any last evening and allow the medicine to wash out of his system. A today he is a heart rate has improved up to 88 and is in sinus rhythm with first-degree AV block and bifascicular block. He thinks he actually feels a little better today although was not diaphoretic. He said he didn't sleep well last night and had some diarrhea this morning. He does not noted change in energy level although he feels that he's had some recurrent palpitations.  01/01/2016  [name] returns today for follow-up. He underwent placement of a pacemaker which was uneventful for complete heart block. He says since that time his color is improved energy is better. Heart rate today is 61 however the pacer is set at a backup rate of 50. He was concerned that this may be still causing a little fatigue but I reassured him that I think that his heart rate is over that number and unlikely to be the cause. Blood pressure is well-controlled. The pacemaker site looks appropriate without any hematoma or effusion. He was recently seen by Almyra Deforest, PA-C , and was noted to have an elevated TSH. This is been elevated in the past but was as high as 9. Free T4 was ordered and those labs are pending. He did have radiation to his thyroid as part of treatment for Hodgkin's lymphoma.  04/18/2015  [name] was seen today in follow-up. Overall he seems to be doing very well. After discharge his amlodipine was increased to 10 mg from 5 mg. I rechecked his blood pressure today was 104/48, and he has noted that occasionally he gets a flushed feeling when exercising. I suspect he may be on too much medicine. We did discuss that amlodipine although does have a benefit for controlling blood pressure, has no intrinsic cardiovascular benefit. He does have normal renal function would probably  benefit from being on an ACE or ARB. He denies any chest pain or worsening shortness of breath. No problems with his pacemaker. He has been seeing an endocrinologist and his thyroid function has normalized on low-dose levothyroxine.  10/17/2016  Mr. Longshore returns today for follow-up. He continues to do well. Recently he had his valsartan and switch to [place] losartan. He was feeling a little fatigue any decrease the dose from 20 mg to 10 mg daily. Blood pressure he said was running low for him. Today seems to be normal at 112/68. He is going to see his primary care provider about retesting of his testosterone. Apparently in the past this is been low as a result of chemotherapy and radiation. 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 He denies any chest pain. He's had no presyncope or syncopal symptoms. His pacemaker is been functioning appropriately on remote checks. This now almost one year since his two-vessel PCI.  04/24/2017  Mr. Grudzinski returns today for follow-up.  Overall he is doing well.  He denies any chest pain or worsening shortness of breath.  Blood pressure was elevated today however came down to 128/56.  He remains a heart rate of 50 which is his backup pacer setting.  He is in complete heart block and testing is indicated that he is pacemaker dependent.  He does do remote checks.  He had two-vessel coronary artery disease and has been asymptomatic with that.  He is on aspirin and high-dose atorvastatin.  He has a physical exam coming up in a few weeks with his primary care provider who will get cholesterol testing at that time.  He is also on olmesartan for blood pressure.  04/12/2018  Mr. Dygert is seen today in follow-up.  He had a very eventful last several months.  As above, he has a history of heart block with permanent pacemaker placement in 2017, CAD status post multivessel PCI in 2017, chronic combined  systolic and diastolic heart failure, status post bioprosthetic MVR and CABG x2 in December 2019, and new onset atrial fibrillation status post cardioversion on 03/26/2018.  He again presented recently with worsening shortness of breath and tachycardia and elective cardioversion was recommended.  In addition he was found to be in significant heart failure and was heavily diuresed.  Since discharge his weight has been down although he did gain back a few pounds.  Despite this, he has no evidence of any heart failure today.  In fact recently he has been getting lightheaded and dizzy and it was advised that he discontinue Lasix and potassium.  He is on Entresto.  His LVEF had decreased to 25% from the mid 40% range.  He is also on warfarin which was started initially for his mitral valve surgery, but has been continued because of A. fib.  There is a question of whether he has valvular A. Fib.  03/20/2019  Mr. Pizzuto returns today for follow-up.  He reports feeling much better.  He underwent biventricular upgrade of his device in December and since then has been doing better with improved energy and less heart failure symptoms.  Blood pressure today is well controlled 130/71.  He reports his weight is stable.  We had discussed uptitrating his Delene Loll however when I suggested we may consider that today he did not want to change his medicines.  His INR has been therapeutic on warfarin.  We discussed whether he possibly could come off of this since his A. fib was post CABG.  He also is being monitored with his device.  I recommended deferring this decision to Dr.Allred who is the A. fib expert when he follows up with him in April.  In addition we discussed a follow-up echo which should occur prior to that visit.  10/13/2019  Mr. Trimm is seen today in follow-up.  Overall he seems to be doing well although reports some fatiguing with exercise.  He says he has been seen by Dr. Rayann Heman in just last week by Chanetta Marshall,  NP for reevaluation of his biventricular pacer.  He noted that his heart rate is not increasing significantly with exertion.  Apparently the device could have a setting enabled which would allow more of that.  He was not sure why that was not programmed initially.  He has not really done a lot of exercise or activity to test his heart rate but is interested to see how he feels with that.  His LVEF  had improved somewhat up to 40% after biventricular upgrade however it was felt that this was too early to recheck it and is due for a repeat echo in October.  Also recently he thought that he was having some side effects including issues with memory or brain fog on a statin.  He had stopped it about a week ago.  He was on 80 mg of atorvastatin however his LDL was well controlled at 50.  05/17/2020  Mr. Dunklee returns today for follow-up.  Overall he says he is doing really well.  He did have repeat lipids recently showing total cholesterol 109, triglycerides 104, HDL 32 and LDL 57.  He is seeing his endocrinologist tomorrow and has an appoint with his PCP next week.  He continues to work but says he may retire within the next year.  Denies any chest pain or worsening shortness of breath.  His remote pacer checks have been stable.  He is in a paced rhythm today.  Blood pressure is on the low normal range.  There is really not room to uptitrate his Entresto.  12/26/2021  Mr. Drolet returns today for follow-up.  He says he is fairly stable.  He has not had any worsening shortness of breath or chest pain.  He recently saw Dr. Lovena Le.  There was some discussion about biventricular pacing.  He is left-sided lead is disabled and it is noted that it was positioned more toward the posterior RV apex.  He did not feel that this would give him adequate improvement in LV function.  It sounds like they discussed the possibility of revising the leads to allow for his bundle pacing.  He was somewhat skeptical about this due to  risks of the procedure however there is a good chance that it could improve his LV function.  This would have certainly improved his quality of life 2.  PMHx:  Past Medical History:  Diagnosis Date   Abnormal TSH    Arthritis    ddd   Atrial fibrillation and flutter ([place])    s/p DCCV 03/26/2018   CAD in native artery    a. 11/2015: s/p 2 vessel PCI of the first diagonal branch of the LAD in the mid left circumflex with DES. // s/p CABG 01/2018   Chronic combined systolic and diastolic CHF (congestive heart failure) ([place])    sees Dr. Rayann Heman    Complete heart block ([place])    a. s/p Medtronic ppm 10/2015.   History of radiation therapy 2002   Hodgkin disease ([place]) 2002   a. s/p chest radiation and chemotherapy   Hypothyroidism    sees Dr. Cruzita Lederer    Incidental pulmonary nodule 01/19/2018   Left apical opacity - possible scarring   Mitral regurgitation    s/p bioprosthetic MV replacement 01/2018   Pacemaker 10/2015   Medtronic   PVC's (premature ventricular contractions)    Right bundle branch block    S/P CABG x 2 01/27/2018   LIMA to LAD, SVG to RCA, EVH via right thigh   S/P mitral valve replacement with bioprosthetic valve 01/27/2018   29 mm [facility] Mitral stented bovine pericardial tissue valve    Past Surgical History:  Procedure Laterality Date   A-FLUTTER ABLATION N/A 06/20/2021   Procedure: A-FLUTTER ABLATION;  Surgeon: Evans Lance, MD;  Location: [place] [facility];  Service: Cardiovascular;  Laterality: N/A;   BIV UPGRADE N/A 02/08/2019   Procedure: UPGRADE TO BIV PPM;  Surgeon: [name], MD;  Location: [place] [facility];  Service: Cardiovascular;  Laterality: N/A;   CARDIAC CATHETERIZATION N/A 11/02/2015   Procedure: Left Heart Cath and Coronary Angiography;  Surgeon: Sherren Mocha, MD;  Location: [place] [facility];  Service: Cardiovascular;  Laterality: N/A;   CARDIAC CATHETERIZATION N/A 11/21/2015   Procedure: Coronary Stent Intervention;  Surgeon:  Sherren Mocha, MD;  Location: [place] [facility];  Service: Cardiovascular;  Laterality: N/A;   CARDIAC CATHETERIZATION  11/2017   CARDIOVERSION N/A 03/26/2018   Procedure: CARDIOVERSION;  Surgeon: [name], MD;  [facility];  Service: Cardiovascular;  Laterality: N/A;   COLONOSCOPY  01/16/2015   per Dr. Ardis Hughs, benign polyps, repeat in 10 yrs    CORONARY ARTERY BYPASS GRAFT N/A 01/27/2018   Procedure: CORONARY ARTERY BYPASS GRAFTING (CABG) x two, using left internal mammary artery and right leg greater saphenous vein harvested endoscopically;  Surgeon: Rexene Alberts, MD;  Location: [place];  Service: Open Heart Surgery;  Laterality: N/A;   EP IMPLANTABLE DEVICE N/A 11/02/2015   MDT Adivisa MRI conditional dual chamber pacemaker implanted by Dr Rayann Heman for complete heart block   KNEE SURGERY Right [place]   MITRAL VALVE REPLACEMENT N/A 01/27/2018   Procedure: MITRAL VALVE (MV) REPLACEMENT using Magna Mitral Ease Valve Size 29MM;  Surgeon: Rexene Alberts, MD;  Location: [place];  Service: Open Heart Surgery;  Laterality: N/A;   RIGHT/LEFT HEART CATH AND CORONARY ANGIOGRAPHY N/A 12/02/2017   Procedure: RIGHT/LEFT HEART CATH AND CORONARY ANGIOGRAPHY;  Surgeon: Jettie Booze, MD;  Location: [place] [facility];  Service: Cardiovascular;  Laterality: N/A;   TEE WITHOUT CARDIOVERSION N/A 12/09/2017   Procedure: TRANSESOPHAGEAL ECHOCARDIOGRAM (TEE);  Surgeon: Buford Dresser, MD;  Location[facility];  Service: Cardiovascular;  Laterality: N/A;   TEE WITHOUT CARDIOVERSION N/A 01/27/2018   Procedure: TRANSESOPHAGEAL ECHOCARDIOGRAM (TEE);  Surgeon: Rexene Alberts, MD;  Location: [place];  Service: Open Heart Surgery;  Laterality: N/A;    FAMHx:  Family History  Problem Relation Age of Onset   Cancer Other        prostate father hx   Heart disease Mother    Heart disease Father    Colon cancer Neg Hx     SOCHx:   reports that he has  never smoked. He has never used smokeless tobacco. He reports current alcohol use of about 5.0 standard drinks of alcohol per week. He reports that he does not use drugs.  ALLERGIES:  Allergies  Allergen Reactions   Other     Pt reported allergic to dust mites that causes of sneezing, runny nose    ROS: Pertinent items noted in HPI and remainder of comprehensive ROS otherwise negative.  HOME MEDS: Current Outpatient Medications  Medication Sig Dispense Refill   atorvastatin (LIPITOR) 40 MG tablet Take 1 tablet by mouth daily 90 tablet 3   carvedilol (COREG) 6.25 MG tablet Take 1 tablet (6.25 mg total) by mouth 2 (two) times daily with a meal. 180 tablet 3   levothyroxine (SYNTHROID) 75 MCG tablet TAKE 1 TABLET BY MOUTH DAILY BEFORE BREAKFAST. 90 tablet 3   Multiple Vitamin (MULTIVITAMIN WITH MINERALS) TABS tablet Take 1 tablet by mouth daily.     sacubitril-valsartan (ENTRESTO) 24-26 MG TAKE 1 TABLET BY MOUTH 2 TIMES DAILY 180 tablet 0   sildenafil (VIAGRA) 50 MG tablet Take 1 tablet (50 mg total) by mouth daily as needed for erectile dysfunction. 30 tablet 3  spironolactone (ALDACTONE) 25 MG tablet TAKE 1/2 TABLET BY MOUTH ONCE A DAY 45 tablet 3   traZODone (DESYREL) 50 MG tablet Take 1 tablet (50 mg total) by mouth at bedtime. 90 tablet 3   No current facility-administered medications for this visit.    LABS/IMAGING: No results found for this or any previous visit (from the past 48 hour(s)).  No results found.  WEIGHTS: Wt Readings from Last 3 Encounters:  12/26/21 195 lb 6.4 oz (88.6 kg)  11/28/21 196 lb 9.6 oz (89.2 kg)  07/29/21 190 lb 12.8 oz (86.5 kg)    VITALS: BP 103/61 (BP Location: Left Arm, Patient Position: Sitting)   Pulse 81   Ht '5\' 11"'$  (1.803 m)   Wt 195 lb 6.4 oz (88.6 kg)   SpO2 99%   BMI 27.25 kg/m   EXAM: General appearance: alert and no distress Neck: no carotid bruit, no JVD and thyroid not enlarged, symmetric, no  tenderness/mass/nodules Lungs: clear to auscultation bilaterally Heart: regular rate and rhythm, S1, S2 normal, no murmur, click, rub or gallop Abdomen: soft, non-tender; bowel sounds normal; no masses,  no organomegaly Extremities: extremities normal, atraumatic, no cyanosis or edema Pulses: 2+ and symmetric Skin: Skin color, texture, turgor normal. No rashes or lesions Neurologic: Grossly normal Psych: Pleasant  EKG: AV dual paced rhythm at 81-personally reviewed  ASSESSMENT: CAD - s/p PCI to the D1 and mid LCX with DES (11/2015) Status post CABG x2 (LIMA to LAD, SVG to RCA) and bioprosthetic MVR -29 mm Edwards magna bovine bioprosthesis (01/2018) Complete heart block-s/p Medtronic pacemaker (10/2015) with CRT-P upgrade  (01/2019) Acute on chronic systolic congestive heart failure with EF as low as 25% (2019), now 30 to 35% (10/2021) Palpitations - PVCs including trigeminy and quadrigeminy, generally improved on b-blocker Abnormal EKG with bifascicular block and competing junctional bradycardia History of chest wall radiation for Hodgkin's lymphoma Essential hypertension Hypothyroidism Paroxysmal atrial fibrillation on warfarin  PLAN: 1.   Mr. Umanzor seems to have stable NYHA class II symptoms.  His blood pressure is low which would not allow significant additional therapies.  We did discuss the addition of an SGLT2 inhibitor however he does not want to pursue that at this time.  He is considering revision to his pacemaker since his sided lead is disabled and the lead positioning would not allow adequate biventricular pacing.  There is a discussion about possible revision of this which he is considering.  We will plan follow-up in about 6 months after which he may have already considered pacemaker upgrade.  His LVEF remains in the 30 to 35% range.  Pixie Casino, MD, [facility] [place] Director of the Advanced Lipid Disorders &  Cardiovascular Risk  Reduction Clinic Diplomate of the American Board of Clinical Lipidology Attending Cardiologist  Direct Dial: [phone]  Fax: [phone]  Website:  [email or web address] Shuan Statzer 12/26/2021, 3:29 PM

## 2021-12-26 NOTE — Patient Instructions (Signed)
Medication Instructions:  NO CHANGES  *If you need a refill on your cardiac medications before your next appointment, please call your pharmacy*    Follow-Up: At [facility], you and your health needs are our priority.  As part of our continuing mission to provide you with exceptional heart care, we have created designated Provider Care Teams.  These Care Teams include your primary Cardiologist (physician) and Advanced Practice Providers (APPs -  Physician Assistants and Nurse Practitioners) who all work together to provide you with the care you need, when you need it.  We recommend signing up for the patient portal called "MyChart".  Sign up information is provided on this After Visit Summary.  MyChart is used to connect with patients for Virtual Visits (Telemedicine).  Patients are able to view lab/test results, encounter notes, upcoming appointments, etc.  Non-urgent messages can be sent to your provider as well.   To learn more about what you can do with MyChart, go to [email or web address].    Your next appointment:    6 months with Dr. Hilty 

## 2022-01-13 ENCOUNTER — Other Ambulatory Visit (HOSPITAL_COMMUNITY): Payer: Self-pay

## 2022-02-06 ENCOUNTER — Ambulatory Visit (INDEPENDENT_AMBULATORY_CARE_PROVIDER_SITE_OTHER): Payer: 59

## 2022-02-06 DIAGNOSIS — I442 Atrioventricular block, complete: Secondary | ICD-10-CM | POA: Diagnosis not present

## 2022-02-06 LAB — CUP PACEART REMOTE DEVICE CHECK
Battery Remaining Longevity: 81 mo
Battery Voltage: 2.97 V
Brady Statistic AP VP Percent: 92.06 %
Brady Statistic AP VS Percent: 0.08 %
Brady Statistic AS VP Percent: 3.62 %
Brady Statistic AS VS Percent: 4.24 %
Brady Statistic RA Percent Paced: 95.31 %
Brady Statistic RV Percent Paced: 95.68 %
Date Time Interrogation Session: 20231228042542
Implantable Lead Connection Status: 753985
Implantable Lead Connection Status: 753985
Implantable Lead Connection Status: 753985
Implantable Lead Implant Date: 20170922
Implantable Lead Implant Date: 20170922
Implantable Lead Implant Date: 20201229
Implantable Lead Location: 753858
Implantable Lead Location: 753859
Implantable Lead Location: 753860
Implantable Lead Model: 4598
Implantable Lead Model: 5076
Implantable Lead Model: 5076
Implantable Pulse Generator Implant Date: 20201229
Lead Channel Impedance Value: 2603 Ohm
Lead Channel Impedance Value: 323 Ohm
Lead Channel Impedance Value: 380 Ohm
Lead Channel Impedance Value: 456 Ohm
Lead Channel Impedance Value: 494 Ohm
Lead Channel Impedance Value: 494 Ohm
Lead Channel Impedance Value: 494 Ohm
Lead Channel Impedance Value: 551 Ohm
Lead Channel Impedance Value: 551 Ohm
Lead Channel Impedance Value: 779 Ohm
Lead Channel Impedance Value: 836 Ohm
Lead Channel Impedance Value: 855 Ohm
Lead Channel Impedance Value: 912 Ohm
Lead Channel Impedance Value: 912 Ohm
Lead Channel Pacing Threshold Amplitude: 0.5 V
Lead Channel Pacing Threshold Amplitude: 0.75 V
Lead Channel Pacing Threshold Amplitude: 2.125 V
Lead Channel Pacing Threshold Pulse Width: 0.4 ms
Lead Channel Pacing Threshold Pulse Width: 0.4 ms
Lead Channel Pacing Threshold Pulse Width: 1 ms
Lead Channel Sensing Intrinsic Amplitude: 2.875 mV
Lead Channel Sensing Intrinsic Amplitude: 2.875 mV
Lead Channel Sensing Intrinsic Amplitude: 28.125 mV
Lead Channel Sensing Intrinsic Amplitude: 28.125 mV
Lead Channel Setting Pacing Amplitude: 2 V
Lead Channel Setting Pacing Amplitude: 2.5 V
Lead Channel Setting Pacing Pulse Width: 0.4 ms
Lead Channel Setting Sensing Sensitivity: 4 mV
Zone Setting Status: 755011
Zone Setting Status: 755011

## 2022-02-07 ENCOUNTER — Other Ambulatory Visit (HOSPITAL_COMMUNITY): Payer: Self-pay

## 2022-02-07 ENCOUNTER — Other Ambulatory Visit: Payer: Self-pay | Admitting: Internal Medicine

## 2022-02-07 MED ORDER — ENTRESTO 24-26 MG PO TABS
1.0000 | ORAL_TABLET | Freq: Two times a day (BID) | ORAL | 3 refills | Status: AC
  Filled 2022-02-07: qty 180, 90d supply, fill #0
  Filled 2022-04-11 – 2022-04-21 (×4): qty 180, 90d supply, fill #1
  Filled 2022-07-20: qty 180, 90d supply, fill #2
  Filled 2022-11-03: qty 180, 90d supply, fill #3

## 2022-02-08 ENCOUNTER — Other Ambulatory Visit (HOSPITAL_COMMUNITY): Payer: Self-pay

## 2022-02-10 ENCOUNTER — Other Ambulatory Visit: Payer: Self-pay

## 2022-02-24 NOTE — Progress Notes (Signed)
Remote pacemaker transmission.   

## 2022-03-19 ENCOUNTER — Encounter: Payer: Self-pay | Admitting: Family Medicine

## 2022-03-19 ENCOUNTER — Ambulatory Visit (INDEPENDENT_AMBULATORY_CARE_PROVIDER_SITE_OTHER): Payer: 59 | Admitting: Family Medicine

## 2022-03-19 ENCOUNTER — Other Ambulatory Visit (HOSPITAL_COMMUNITY): Payer: Self-pay

## 2022-03-19 VITALS — BP 136/80 | HR 74 | Temp 98.6°F | Wt 196.2 lb

## 2022-03-19 DIAGNOSIS — R059 Cough, unspecified: Secondary | ICD-10-CM | POA: Diagnosis not present

## 2022-03-19 DIAGNOSIS — J019 Acute sinusitis, unspecified: Secondary | ICD-10-CM

## 2022-03-19 LAB — POC COVID19 BINAXNOW: SARS Coronavirus 2 Ag: NEGATIVE

## 2022-03-19 LAB — POCT INFLUENZA A/B
Influenza A, POC: NEGATIVE
Influenza B, POC: NEGATIVE

## 2022-03-19 MED ORDER — AMOXICILLIN-POT CLAVULANATE 875-125 MG PO TABS
1.0000 | ORAL_TABLET | Freq: Two times a day (BID) | ORAL | 0 refills | Status: DC
  Filled 2022-03-19: qty 20, 10d supply, fill #0

## 2022-03-19 NOTE — Progress Notes (Signed)
Subjective:    Patient ID: Tyler Hendrix, male    DOB: 1955-09-15, 67 y.o.   MRN: [id number]  HPI Here for 2 days of pressure in both ears, PND, stuffy head, and coughing up green sputum. No fever or SOB. He is using Nyquil and Dayquil.    Review of Systems  Constitutional: Negative.   HENT:  Positive for congestion, ear pain, postnasal drip and sinus pressure. Negative for sore throat.   Eyes: Negative.   Respiratory:  Positive for cough. Negative for shortness of breath and wheezing.        Objective:   Physical Exam Constitutional:      Appearance: Normal appearance. He is not ill-appearing.  HENT:     Right Ear: Tympanic membrane, ear canal and external ear normal.     Left Ear: Tympanic membrane, ear canal and external ear normal.     Nose: Nose normal.     Mouth/Throat:     Pharynx: Oropharynx is clear.  Eyes:     Conjunctiva/sclera: Conjunctivae normal.  Pulmonary:     Effort: Pulmonary effort is normal.     Breath sounds: Normal breath sounds.  Lymphadenopathy:     Cervical: No cervical adenopathy.  Neurological:     Mental Status: He is alert.           Assessment & Plan:  Sinusitis, treat with 10 days of Augmentin.  Gershon Crane, MD

## 2022-04-11 ENCOUNTER — Encounter (HOSPITAL_COMMUNITY): Payer: Self-pay

## 2022-04-11 ENCOUNTER — Other Ambulatory Visit (HOSPITAL_COMMUNITY): Payer: Self-pay

## 2022-04-11 ENCOUNTER — Other Ambulatory Visit: Payer: Self-pay

## 2022-04-14 ENCOUNTER — Other Ambulatory Visit (HOSPITAL_COMMUNITY): Payer: Self-pay

## 2022-04-21 ENCOUNTER — Other Ambulatory Visit: Payer: Self-pay

## 2022-04-21 ENCOUNTER — Other Ambulatory Visit (HOSPITAL_COMMUNITY): Payer: Self-pay

## 2022-05-08 ENCOUNTER — Ambulatory Visit (INDEPENDENT_AMBULATORY_CARE_PROVIDER_SITE_OTHER): Payer: 59

## 2022-05-08 DIAGNOSIS — I442 Atrioventricular block, complete: Secondary | ICD-10-CM | POA: Diagnosis not present

## 2022-05-08 LAB — CUP PACEART REMOTE DEVICE CHECK
Battery Remaining Longevity: 74 mo
Battery Voltage: 2.96 V
Brady Statistic AP VP Percent: 92.19 %
Brady Statistic AP VS Percent: 0.04 %
Brady Statistic AS VP Percent: 5.37 %
Brady Statistic AS VS Percent: 2.4 %
Brady Statistic RA Percent Paced: 93.84 %
Brady Statistic RV Percent Paced: 97.56 %
Date Time Interrogation Session: 20240328070438
Implantable Lead Connection Status: 753985
Implantable Lead Connection Status: 753985
Implantable Lead Connection Status: 753985
Implantable Lead Implant Date: 20170922
Implantable Lead Implant Date: 20170922
Implantable Lead Implant Date: 20201229
Implantable Lead Location: 753858
Implantable Lead Location: 753859
Implantable Lead Location: 753860
Implantable Lead Model: 4598
Implantable Lead Model: 5076
Implantable Lead Model: 5076
Implantable Pulse Generator Implant Date: 20201229
Lead Channel Impedance Value: 2641 Ohm
Lead Channel Impedance Value: 304 Ohm
Lead Channel Impedance Value: 361 Ohm
Lead Channel Impedance Value: 437 Ohm
Lead Channel Impedance Value: 475 Ohm
Lead Channel Impedance Value: 475 Ohm
Lead Channel Impedance Value: 475 Ohm
Lead Channel Impedance Value: 513 Ohm
Lead Channel Impedance Value: 532 Ohm
Lead Channel Impedance Value: 741 Ohm
Lead Channel Impedance Value: 798 Ohm
Lead Channel Impedance Value: 836 Ohm
Lead Channel Impedance Value: 855 Ohm
Lead Channel Impedance Value: 874 Ohm
Lead Channel Pacing Threshold Amplitude: 0.5 V
Lead Channel Pacing Threshold Amplitude: 0.75 V
Lead Channel Pacing Threshold Amplitude: 2.125 V
Lead Channel Pacing Threshold Pulse Width: 0.4 ms
Lead Channel Pacing Threshold Pulse Width: 0.4 ms
Lead Channel Pacing Threshold Pulse Width: 1 ms
Lead Channel Sensing Intrinsic Amplitude: 2.625 mV
Lead Channel Sensing Intrinsic Amplitude: 2.625 mV
Lead Channel Sensing Intrinsic Amplitude: 31.625 mV
Lead Channel Sensing Intrinsic Amplitude: 31.625 mV
Lead Channel Setting Pacing Amplitude: 2 V
Lead Channel Setting Pacing Amplitude: 2.5 V
Lead Channel Setting Pacing Pulse Width: 0.4 ms
Lead Channel Setting Sensing Sensitivity: 4 mV
Zone Setting Status: 755011
Zone Setting Status: 755011

## 2022-05-29 ENCOUNTER — Telehealth: Payer: Self-pay | Admitting: Internal Medicine

## 2022-05-29 NOTE — Telephone Encounter (Signed)
Left message to call back   (Checked with device clinic and 6.2 years left on battery)

## 2022-05-29 NOTE — Telephone Encounter (Signed)
Pt states he was advised to call and speak with Weston Brass regarding setting up pacemaker replacement. Please advise.

## 2022-05-30 NOTE — Telephone Encounter (Signed)
Pt understands he has 6.2 yrs left on battery. He is calling in to schedule upgrade to BiV. He and Dr. Ladona Ridgel discussed this at his last OV (October 2023).  He was going to think about it and let us know when he was ready. Pt informed that I am sending this to MD & his nurse to address. Pt states next week would be best for nurse to reach out to him to schedule this.

## 2022-06-03 NOTE — Telephone Encounter (Signed)
I've reviewed [name] records and he will need to come in and see me before his procedure. I would like to review the findings from his implant with Dr. Johney Frame. GT

## 2022-06-16 NOTE — Progress Notes (Signed)
Remote pacemaker transmission.   

## 2022-06-19 ENCOUNTER — Ambulatory Visit: Payer: 59 | Attending: Internal Medicine | Admitting: Internal Medicine

## 2022-06-19 ENCOUNTER — Encounter: Payer: Self-pay | Admitting: Internal Medicine

## 2022-06-19 VITALS — BP 114/74 | HR 81 | Ht 71.0 in | Wt 196.2 lb

## 2022-06-19 DIAGNOSIS — I4891 Unspecified atrial fibrillation: Secondary | ICD-10-CM | POA: Diagnosis not present

## 2022-06-19 DIAGNOSIS — I442 Atrioventricular block, complete: Secondary | ICD-10-CM | POA: Diagnosis not present

## 2022-06-19 DIAGNOSIS — Z7189 Other specified counseling: Secondary | ICD-10-CM | POA: Insufficient documentation

## 2022-06-19 DIAGNOSIS — I4892 Unspecified atrial flutter: Secondary | ICD-10-CM | POA: Diagnosis not present

## 2022-06-19 DIAGNOSIS — Z45018 Encounter for adjustment and management of other part of cardiac pacemaker: Secondary | ICD-10-CM | POA: Diagnosis not present

## 2022-06-19 DIAGNOSIS — Z01812 Encounter for preprocedural laboratory examination: Secondary | ICD-10-CM

## 2022-06-19 LAB — CUP PACEART INCLINIC DEVICE CHECK
Battery Remaining Longevity: 70 mo
Battery Voltage: 2.96 V
Brady Statistic AP VP Percent: 92.65 %
Brady Statistic AP VS Percent: 0.05 %
Brady Statistic AS VP Percent: 4.61 %
Brady Statistic AS VS Percent: 2.7 %
Brady Statistic RA Percent Paced: 94.62 %
Brady Statistic RV Percent Paced: 97.25 %
Date Time Interrogation Session: 20240509120728
Implantable Lead Connection Status: 753985
Implantable Lead Connection Status: 753985
Implantable Lead Connection Status: 753985
Implantable Lead Implant Date: 20170922
Implantable Lead Implant Date: 20170922
Implantable Lead Implant Date: 20201229
Implantable Lead Location: 753858
Implantable Lead Location: 753859
Implantable Lead Location: 753860
Implantable Lead Model: 4598
Implantable Lead Model: 5076
Implantable Lead Model: 5076
Implantable Pulse Generator Implant Date: 20201229
Lead Channel Impedance Value: 2679 Ohm
Lead Channel Impedance Value: 342 Ohm
Lead Channel Impedance Value: 399 Ohm
Lead Channel Impedance Value: 437 Ohm
Lead Channel Impedance Value: 437 Ohm
Lead Channel Impedance Value: 475 Ohm
Lead Channel Impedance Value: 513 Ohm
Lead Channel Impedance Value: 532 Ohm
Lead Channel Impedance Value: 551 Ohm
Lead Channel Impedance Value: 741 Ohm
Lead Channel Impedance Value: 779 Ohm
Lead Channel Impedance Value: 836 Ohm
Lead Channel Impedance Value: 855 Ohm
Lead Channel Impedance Value: 855 Ohm
Lead Channel Pacing Threshold Amplitude: 0.5 V
Lead Channel Pacing Threshold Amplitude: 0.75 V
Lead Channel Pacing Threshold Amplitude: 2.125 V
Lead Channel Pacing Threshold Pulse Width: 0.4 ms
Lead Channel Pacing Threshold Pulse Width: 0.4 ms
Lead Channel Pacing Threshold Pulse Width: 1 ms
Lead Channel Sensing Intrinsic Amplitude: 26 mV
Lead Channel Sensing Intrinsic Amplitude: 3.25 mV
Lead Channel Sensing Intrinsic Amplitude: 3.625 mV
Lead Channel Sensing Intrinsic Amplitude: 31.625 mV
Lead Channel Setting Pacing Amplitude: 1 V
Lead Channel Setting Pacing Amplitude: 2 V
Lead Channel Setting Pacing Amplitude: 2.5 V
Lead Channel Setting Pacing Pulse Width: 0.4 ms
Lead Channel Setting Pacing Pulse Width: 0.8 ms
Lead Channel Setting Sensing Sensitivity: 4 mV
Zone Setting Status: 755011
Zone Setting Status: 755011

## 2022-06-19 NOTE — Patient Instructions (Addendum)
Medication Instructions:  Your physician recommends that you continue on your current medications as directed. Please refer to the Current Medication list given to you today.  *If you need a refill on your cardiac medications before your next appointment, please call your pharmacy*  Lab Work: You will have pre-procedure labs drawn; CBC and BMET, We will schedule this when you contact [facility] confirming date.   If you have labs (blood work) drawn today and your tests are completely normal, you will receive your results only by: MyChart Message (if you have MyChart) OR A paper copy in the mail If you have any lab test that is abnormal or we need to change your treatment, we will call you to review the results.  Testing/Procedures: None ordered.  Follow-Up: Dr. Lewayne Bunting has ordered a Medtronic BiV Pacemaker extraction, and Medtronic BiV Pacemaker implant; Scheduled 07/21/2022 at 330 pm; Pt will need to arrive at 130 pm for nursing work up.  Dr. Ladona Ridgel wants Pt to stay overnight.  Surgical scrub was given at this visit.      Pacemaker Implantation, Adult Pacemaker implantation is a procedure to place a pacemaker inside the chest. A pacemaker is a small computer that sends electrical signals to the heart and helps the heart beat normally. A pacemaker also stores information about heart rhythms. You may need pacemaker implantation if you have: A slow heartbeat (bradycardia). Loss of consciousness that happens repeatedly (syncope) or repeated episodes of dizziness or light-headedness because of an irregular heart rate. Shortness of breath (dyspnea) due to heart problems. The pacemaker usually attaches to your heart through a wire called a lead. One or two leads may be needed. There are different types of pacemakers: Transvenous pacemaker. This type is placed under the skin or muscle of your upper chest area. The lead goes through a vein in the chest area to reach the inside of the  heart. Epicardial pacemaker. This type is placed under the skin or muscle of your chest or abdomen. The lead goes through your chest to the outside of the heart. Tell a health care provider about: Any allergies you have. All medicines you are taking, including vitamins, herbs, eye drops, creams, and over-the-counter medicines. Any problems you or family members have had with anesthetic medicines. Any blood or bone disorders you have. Any surgeries you have had. Any medical conditions you have. Whether you are pregnant or may be pregnant. What are the risks? Generally, this is a safe procedure. However, problems may occur, including: Infection. Bleeding. Failure of the pacemaker or the lead. Collapse of a lung or bleeding into a lung. Blood clot inside a blood vessel with a lead. Damage to the heart. Infection inside the heart (endocarditis). Allergic reactions to medicines. What happens before the procedure? Staying hydrated Follow instructions from your health care provider about hydration, which may include: Up to 2 hours before the procedure - you may continue to drink clear liquids, such as water, clear fruit juice, black coffee, and plain tea.  Eating and drinking restrictions Follow instructions from your health care provider about eating and drinking, which may include: 8 hours before the procedure - stop eating heavy meals or foods, such as meat, fried foods, or fatty foods. 6 hours before the procedure - stop eating light meals or foods, such as toast or cereal. 6 hours before the procedure - stop drinking milk or drinks that contain milk. 2 hours before the procedure - stop drinking clear liquids. Medicines Ask your health care  provider about: Changing or stopping your regular medicines. This is especially important if you are taking diabetes medicines or blood thinners. Taking medicines such as aspirin and ibuprofen. These medicines can thin your blood. Do not take these  medicines unless your health care provider tells you to take them. Taking over-the-counter medicines, vitamins, herbs, and supplements. Tests You may have: A heart evaluation. This may include: An electrocardiogram (ECG). This involves placing patches on your skin to check your heart rhythm. A chest X-ray. An echocardiogram. This is a test that uses sound waves (ultrasound) to produce an image of the heart. A cardiac rhythm monitor. This is used to record your heart rhythm and any events for a longer period of time. Blood tests. Genetic testing. General instructions Do not use any products that contain nicotine or tobacco for at least 4 weeks before the procedure. These products include cigarettes, e-cigarettes, and chewing tobacco. If you need help quitting, ask your health care provider. Ask your health care provider: How your surgery site will be marked. What steps will be taken to help prevent infection. These steps may include: Removing hair at the surgery site. Washing skin with a germ-killing soap. Receiving antibiotic medicine. Plan to have someone take you home from the hospital or clinic. If you will be going home right after the procedure, plan to have someone with you for 24 hours. What happens during the procedure? An IV will be inserted into one of your veins. You will be given one or more of the following: A medicine to help you relax (sedative). A medicine to numb the area (local anesthetic). A medicine to make you fall asleep (general anesthetic). The next steps vary depending on the type of pacemaker you will be getting. If you are getting a transvenous pacemaker: An incision will be made in your upper chest. A pocket will be made for the pacemaker. It may be placed under the skin or between layers of muscle. The lead will be inserted into a blood vessel that goes to the heart. While X-rays are taken by an imaging machine (fluoroscopy), the lead will be advanced  through the vein to the inside of your heart. The other end of the lead will be tunneled under the skin and attached to the pacemaker. If you are getting an epicardial pacemaker: An incision will be made near your ribs or breastbone (sternum) for the lead. The lead will be attached to the outside of your heart. Another incision will be made in your chest or upper abdomen to create a pocket for the pacemaker. The free end of the lead will be tunneled under the skin and attached to the pacemaker. The transvenous or epicardial pacemaker will be tested. Imaging studies may be done to check the lead position. The incisions will be closed with stitches (sutures), adhesive strips, or skin glue. Bandages (dressings) will be placed over the incisions. The procedure may vary among health care providers and hospitals. What happens after the procedure? Your blood pressure, heart rate, breathing rate, and blood oxygen level will be monitored until you leave the hospital or clinic. You may be given antibiotics. You will be given pain medicine. An ECG and chest X-rays will be done. You may need to wear a continuous type of ECG (Holter monitor) to check your heart rhythm. Your health care provider will program the pacemaker. If you were given a sedative during the procedure, it can affect you for several hours. Do not drive or operate machinery until  your health care provider says that it is safe. You will be given a pacemaker identification card. This card lists the implant date, device model, and manufacturer of your pacemaker. Summary A pacemaker is a small computer that sends electrical signals to the heart and helps the heart beat normally. There are different types of pacemakers. A pacemaker may be placed under the skin or muscle of your chest or abdomen. Follow instructions from your health care provider about eating and drinking and about taking medicines before the procedure. This information is not  intended to replace advice given to you by your health care provider. Make sure you discuss any questions you have with your health care provider. Document Revised: 10/05/2020 Document Reviewed: 12/29/2018 Elsevier Patient Education  2023 [employer].

## 2022-06-19 NOTE — Progress Notes (Signed)
HPI Tyler Hendrix returns today for followup. He is a pleasant 67 yo man with a h/o chronic systolic heart failure, EF 35%, CHB, who underwent biv PPM insertion several years ago. He was bothered by diaghragmatic stimulation. He has continued to have class 3 symptoms and pacing induced LBBB.  Allergies  Allergen Reactions   Other     Pt reported allergic to dust mites that causes of sneezing, runny nose     Current Outpatient Medications  Medication Sig Dispense Refill   aspirin EC 81 MG tablet Take 81 mg by mouth daily.     atorvastatin (LIPITOR) 40 MG tablet Take 1 tablet by mouth daily 90 tablet 3   carvedilol (COREG) 6.25 MG tablet Take 1 tablet (6.25 mg total) by mouth 2 (two) times daily with a meal. 180 tablet 3   levothyroxine (SYNTHROID) 75 MCG tablet TAKE 1 TABLET BY MOUTH DAILY BEFORE BREAKFAST. 90 tablet 3   Multiple Vitamin (MULTIVITAMIN WITH MINERALS) TABS tablet Take 1 tablet by mouth daily.     sacubitril-valsartan (ENTRESTO) 24-26 MG Take 1 tablet by mouth 2 (two) times daily. 180 tablet 3   sildenafil (VIAGRA) 50 MG tablet Take 1 tablet (50 mg total) by mouth daily as needed for erectile dysfunction. 30 tablet 3   spironolactone (ALDACTONE) 25 MG tablet TAKE 1/2 TABLET BY MOUTH ONCE A DAY 45 tablet 3   traZODone (DESYREL) 50 MG tablet Take 1 tablet (50 mg total) by mouth at bedtime. 90 tablet 3   No current facility-administered medications for this visit.     Past Medical History:  Diagnosis Date   Abnormal TSH    Arthritis    ddd   Atrial fibrillation and flutter (HCC)    s/p DCCV 03/26/2018   CAD in native artery    a. 11/2015: s/p 2 vessel PCI of the first diagonal branch of the LAD in the mid left circumflex with DES. // s/p CABG 01/2018   Chronic combined systolic and diastolic CHF (congestive heart failure) (HCC)    sees Dr. Johney Frame    Complete heart block (HCC)    a. s/p Medtronic ppm 10/2015.   History of radiation therapy 2002   Hodgkin disease  (HCC) 2002   a. s/p chest radiation and chemotherapy   Hypothyroidism    sees Dr. 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/27/2018   29 mm [facility] Mitral stented bovine pericardial tissue valve    ROS:   All systems reviewed and negative except as noted in the HPI.   Past Surgical History:  Procedure Laterality Date   A-FLUTTER ABLATION N/A 06/20/2021   Procedure: A-FLUTTER ABLATION;  Surgeon: Marinus Maw, MD;  Location: [facility] INVASIVE CV LAB;  Service: Cardiovascular;  Laterality: N/A;   BIV UPGRADE N/A 02/08/2019   Procedure: UPGRADE TO BIV PPM;  Surgeon: Hillis Range, MD;  Location: [facility] INVASIVE CV LAB;  Service: Cardiovascular;  Laterality: N/A;   CARDIAC CATHETERIZATION N/A 11/02/2015   Procedure: Left Heart Cath and Coronary Angiography;  Surgeon: Tonny Bollman, MD;  Location: [facility]  INVASIVE CV LAB;  Service: Cardiovascular;  Laterality: N/A;   CARDIAC CATHETERIZATION N/A 11/21/2015   Procedure: Coronary Stent Intervention;  Surgeon: Tonny Bollman, MD;  Location: [facility] INVASIVE CV LAB;  Service: Cardiovascular;  Laterality: N/A;   CARDIAC CATHETERIZATION  11/2017   CARDIOVERSION N/A 03/26/2018   Procedure: CARDIOVERSION;  Surgeon: Parke Poisson, MD;  Location: [facility] ENDOSCOPY;  Service: Cardiovascular;  Laterality: N/A;   COLONOSCOPY  01/16/2015   per Dr. Christella Hartigan, benign polyps, repeat in 10 yrs    CORONARY ARTERY BYPASS GRAFT N/A 01/27/2018   Procedure: CORONARY ARTERY BYPASS GRAFTING (CABG) x two, using left internal mammary artery and right leg greater saphenous vein harvested endoscopically;  Surgeon: Purcell Nails, MD;  Location: [facility] OR;  Service: Open Heart Surgery;  Laterality: N/A;   EP IMPLANTABLE DEVICE N/A 11/02/2015   MDT Adivisa MRI conditional dual chamber pacemaker implanted by Dr Johney Frame for complete heart block   KNEE SURGERY Right 1972   LUMBAR LAMINECTOMY  1996   MITRAL VALVE REPLACEMENT N/A 01/27/2018   Procedure: MITRAL VALVE (MV) REPLACEMENT using Magna Mitral Ease Valve Size ;  Surgeon: Purcell Nails, MD;  Location: [facility] OR;  Service: Open Heart Surgery;  Laterality: N/A;   RIGHT/LEFT HEART CATH AND CORONARY ANGIOGRAPHY N/A 12/02/2017   Procedure: RIGHT/LEFT HEART CATH AND CORONARY ANGIOGRAPHY;  Surgeon: Corky Crafts, MD;  Location: [facility] INVASIVE CV LAB;  Service: Cardiovascular;  Laterality: N/A;   TEE WITHOUT CARDIOVERSION N/A 12/09/2017   Procedure: TRANSESOPHAGEAL ECHOCARDIOGRAM (TEE);  Surgeon: Jodelle Red, MD;  Location: [facility] ENDOSCOPY;  Service: Cardiovascular;  Laterality: N/A;   TEE WITHOUT CARDIOVERSION N/A 01/27/2018   Procedure: TRANSESOPHAGEAL ECHOCARDIOGRAM (TEE);  Surgeon: Purcell Nails, MD;  Location: [facility] OR;  Service: Open Heart Surgery;  Laterality: N/A;     Family History  Problem Relation Age of Onset   Cancer Other        prostate father hx   Heart disease Mother    Heart disease Father    Colon cancer Neg Hx      Social History   Socioeconomic History   Marital status: Married    Spouse name: Not on file   Number of children: 2   Years of education: Masters   Highest education level: Not on file  Occupational History   Occupation: lawyer    Comment: [employer]  Tobacco Use   Smoking status: Never   Smokeless tobacco: Never  Vaping Use   Vaping Use: Never used  Substance and Sexual Activity   Alcohol use: Yes    Alcohol/week: 5.0 standard drinks of alcohol    Types: 5 Glasses of wine per week   Drug use: No   Sexual activity: Not on file  Other Topics Concern   Not on file  Social History  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 [place]   Social Determinants of Health   Financial Resource Strain: Not on file  Food Insecurity: Not on file  Transportation Needs: Not on file  Physical Activity: Not on file  Stress: Not on file  Social Connections: Not on file  Intimate Partner Violence: Not on file     BP 114/74   Pulse 81   Ht 5\' 11"  (1.803 m)   Wt 196 lb 3.2 oz (89 kg)   SpO2 98%   BMI 27.36 kg/m   Physical Exam:  Well appearing NAD HEENT: Unremarkable Neck:  No JVD, no thyromegally Lymphatics:  No adenopathy Back:  No CVA tenderness Lungs:  Clear with basilar rales HEART:  Regular rate rhythm, no murmurs, no rubs, no clicks Abd:  soft, positive bowel sounds, no organomegally, no rebound, no guarding Ext:  2 plus pulses, no edema, no cyanosis, no clubbing Skin:  No rashes no nodules Neuro:  CN II through XII intact, motor grossly intact  EKG - nsr with pacing induced LBBB, QRS 170 ms  DEVICE  Normal device function.  See PaceArt for details.   Assess/Plan: Chronic systolic heart failure - I have discussed the treatment options. If he is not better with reprogramming of his device then I will have him undergo extraction and biv upgrade. CHB - he has an escape today at 31/min. 3. PPM - we were able to reprogram his PPM with the LV lead turned back on and a quarter volt safety margin. If this does not make him feel better, I will have him come in for a lead extraction and insertion of a LB area lead and a CS lead.I have reviewed the indications/risks/benefits/goals/expectations of biv PPM insertion and extraction of the RV lead and he wishes to proceed if he does not feel better. 4. HTN - his bp is well controlled.   Lewayne Bunting, MD

## 2022-06-25 ENCOUNTER — Telehealth: Payer: Self-pay | Admitting: Internal Medicine

## 2022-06-25 NOTE — Telephone Encounter (Signed)
Pt called in asking to speak to Weston Brass, RN about his upcoming surgery

## 2022-06-25 NOTE — Telephone Encounter (Signed)
Pt called back per message received in [facility] Triage.     Pt did not answer telephone, and number listed did not have voicemail box set up.  Unable to leave message.  Follow up still required.   I will send the Pt a MyChart message.

## 2022-06-26 NOTE — Telephone Encounter (Signed)
Called pt to schedule lab appt. No answer and No VM. I will send a MyChart message.  Cath Lab has been updated with correct procedure codes. Bartle will be back up surgeon.

## 2022-06-30 ENCOUNTER — Inpatient Hospital Stay (HOSPITAL_COMMUNITY): Payer: 59

## 2022-06-30 ENCOUNTER — Inpatient Hospital Stay (HOSPITAL_COMMUNITY)
Admission: EM | Admit: 2022-06-30 | Discharge: 2022-07-03 | DRG: 481 | Disposition: A | Discharge status: Home Health | Payer: 59 | Attending: Internal Medicine | Admitting: Internal Medicine

## 2022-06-30 ENCOUNTER — Emergency Department (HOSPITAL_COMMUNITY): Payer: 59

## 2022-06-30 ENCOUNTER — Other Ambulatory Visit: Payer: Self-pay

## 2022-06-30 DIAGNOSIS — I255 Ischemic cardiomyopathy: Secondary | ICD-10-CM | POA: Diagnosis present

## 2022-06-30 DIAGNOSIS — N179 Acute kidney failure, unspecified: Secondary | ICD-10-CM | POA: Diagnosis not present

## 2022-06-30 DIAGNOSIS — Z8571 Personal history of Hodgkin lymphoma: Secondary | ICD-10-CM

## 2022-06-30 DIAGNOSIS — S7010XA Contusion of unspecified thigh, initial encounter: Secondary | ICD-10-CM | POA: Diagnosis present

## 2022-06-30 DIAGNOSIS — I509 Heart failure, unspecified: Secondary | ICD-10-CM | POA: Diagnosis not present

## 2022-06-30 DIAGNOSIS — I427 Cardiomyopathy due to drug and external agent: Secondary | ICD-10-CM | POA: Diagnosis not present

## 2022-06-30 DIAGNOSIS — Z7901 Long term (current) use of anticoagulants: Secondary | ICD-10-CM | POA: Diagnosis not present

## 2022-06-30 DIAGNOSIS — Z01818 Encounter for other preprocedural examination: Secondary | ICD-10-CM | POA: Diagnosis not present

## 2022-06-30 DIAGNOSIS — S80212A Abrasion, left knee, initial encounter: Secondary | ICD-10-CM | POA: Diagnosis not present

## 2022-06-30 DIAGNOSIS — I501 Left ventricular failure: Secondary | ICD-10-CM | POA: Diagnosis not present

## 2022-06-30 DIAGNOSIS — Z79890 Hormone replacement therapy (postmenopausal): Secondary | ICD-10-CM

## 2022-06-30 DIAGNOSIS — E871 Hypo-osmolality and hyponatremia: Secondary | ICD-10-CM | POA: Diagnosis not present

## 2022-06-30 DIAGNOSIS — Y929 Unspecified place or not applicable: Secondary | ICD-10-CM | POA: Diagnosis not present

## 2022-06-30 DIAGNOSIS — Z923 Personal history of irradiation: Secondary | ICD-10-CM | POA: Diagnosis not present

## 2022-06-30 DIAGNOSIS — I480 Paroxysmal atrial fibrillation: Secondary | ICD-10-CM | POA: Diagnosis present

## 2022-06-30 DIAGNOSIS — W11XXXA Fall on and from ladder, initial encounter: Secondary | ICD-10-CM | POA: Diagnosis present

## 2022-06-30 DIAGNOSIS — Z8572 Personal history of non-Hodgkin lymphomas: Secondary | ICD-10-CM

## 2022-06-30 DIAGNOSIS — I2510 Atherosclerotic heart disease of native coronary artery without angina pectoris: Secondary | ICD-10-CM | POA: Diagnosis not present

## 2022-06-30 DIAGNOSIS — M7989 Other specified soft tissue disorders: Secondary | ICD-10-CM | POA: Diagnosis not present

## 2022-06-30 DIAGNOSIS — E785 Hyperlipidemia, unspecified: Secondary | ICD-10-CM | POA: Diagnosis present

## 2022-06-30 DIAGNOSIS — Z950 Presence of cardiac pacemaker: Secondary | ICD-10-CM | POA: Diagnosis not present

## 2022-06-30 DIAGNOSIS — E039 Hypothyroidism, unspecified: Secondary | ICD-10-CM | POA: Diagnosis present

## 2022-06-30 DIAGNOSIS — I10 Essential (primary) hypertension: Secondary | ICD-10-CM | POA: Diagnosis present

## 2022-06-30 DIAGNOSIS — Z955 Presence of coronary angioplasty implant and graft: Secondary | ICD-10-CM

## 2022-06-30 DIAGNOSIS — S72402A Unspecified fracture of lower end of left femur, initial encounter for closed fracture: Secondary | ICD-10-CM | POA: Diagnosis not present

## 2022-06-30 DIAGNOSIS — Z9221 Personal history of antineoplastic chemotherapy: Secondary | ICD-10-CM | POA: Diagnosis not present

## 2022-06-30 DIAGNOSIS — I5042 Chronic combined systolic (congestive) and diastolic (congestive) heart failure: Secondary | ICD-10-CM | POA: Diagnosis present

## 2022-06-30 DIAGNOSIS — W19XXXA Unspecified fall, initial encounter: Secondary | ICD-10-CM | POA: Diagnosis not present

## 2022-06-30 DIAGNOSIS — Z91048 Other nonmedicinal substance allergy status: Secondary | ICD-10-CM

## 2022-06-30 DIAGNOSIS — S7292XA Unspecified fracture of left femur, initial encounter for closed fracture: Secondary | ICD-10-CM | POA: Diagnosis not present

## 2022-06-30 DIAGNOSIS — I442 Atrioventricular block, complete: Secondary | ICD-10-CM | POA: Diagnosis present

## 2022-06-30 DIAGNOSIS — S7290XA Unspecified fracture of unspecified femur, initial encounter for closed fracture: Secondary | ICD-10-CM | POA: Diagnosis present

## 2022-06-30 DIAGNOSIS — Z951 Presence of aortocoronary bypass graft: Secondary | ICD-10-CM

## 2022-06-30 DIAGNOSIS — S72352A Displaced comminuted fracture of shaft of left femur, initial encounter for closed fracture: Secondary | ICD-10-CM | POA: Diagnosis not present

## 2022-06-30 DIAGNOSIS — I519 Heart disease, unspecified: Secondary | ICD-10-CM | POA: Diagnosis not present

## 2022-06-30 DIAGNOSIS — Z7982 Long term (current) use of aspirin: Secondary | ICD-10-CM

## 2022-06-30 DIAGNOSIS — D62 Acute posthemorrhagic anemia: Secondary | ICD-10-CM | POA: Diagnosis not present

## 2022-06-30 DIAGNOSIS — Z8249 Family history of ischemic heart disease and other diseases of the circulatory system: Secondary | ICD-10-CM

## 2022-06-30 DIAGNOSIS — Z043 Encounter for examination and observation following other accident: Secondary | ICD-10-CM | POA: Diagnosis not present

## 2022-06-30 DIAGNOSIS — I110 Hypertensive heart disease with heart failure: Secondary | ICD-10-CM | POA: Diagnosis present

## 2022-06-30 DIAGNOSIS — I700 Atherosclerosis of aorta: Secondary | ICD-10-CM | POA: Diagnosis not present

## 2022-06-30 DIAGNOSIS — S72302A Unspecified fracture of shaft of left femur, initial encounter for closed fracture: Principal | ICD-10-CM | POA: Diagnosis present

## 2022-06-30 DIAGNOSIS — Z79899 Other long term (current) drug therapy: Secondary | ICD-10-CM

## 2022-06-30 LAB — CBC
HCT: 38.4 % — ABNORMAL LOW (ref 39.0–52.0)
Hemoglobin: 12.9 g/dL — ABNORMAL LOW (ref 13.0–17.0)
MCH: 32 pg (ref 26.0–34.0)
MCHC: 33.6 g/dL (ref 30.0–36.0)
MCV: 95.3 fL (ref 80.0–100.0)
Platelets: 312 10*3/uL (ref 150–400)
RBC: 4.03 MIL/uL — ABNORMAL LOW (ref 4.22–5.81)
RDW: 13.1 % (ref 11.5–15.5)
WBC: 8.8 10*3/uL (ref 4.0–10.5)
nRBC: 0 % (ref 0.0–0.2)

## 2022-06-30 LAB — BASIC METABOLIC PANEL
Anion gap: 8 (ref 5–15)
BUN: 18 mg/dL (ref 8–23)
CO2: 24 mmol/L (ref 22–32)
Calcium: 9 mg/dL (ref 8.9–10.3)
Chloride: 104 mmol/L (ref 98–111)
Creatinine, Ser: 1.19 mg/dL (ref 0.61–1.24)
GFR, Estimated: >60 mL/min (ref >60)
Glucose, Bld: 108 mg/dL — ABNORMAL HIGH (ref 70–99)
Potassium: 4.3 mmol/L (ref 3.5–5.1)
Sodium: 136 mmol/L (ref 135–145)

## 2022-06-30 LAB — PROTIME-INR
INR: 1.1 (ref 0.8–1.2)
Prothrombin Time: 14.6 seconds (ref 11.4–15.2)

## 2022-06-30 MED ORDER — ONDANSETRON HCL 4 MG/2ML IJ SOLN: 4.0000 mg | Freq: Four times a day (QID) | INTRAMUSCULAR | Status: DC | PRN

## 2022-06-30 MED ORDER — HYDROMORPHONE HCL 1 MG/ML IJ SOLN
1.0000 mg | Freq: Once | INTRAMUSCULAR | Status: AC
  Administered 2022-06-30: 1 mg via INTRAVENOUS
  Filled 2022-06-30: qty 1

## 2022-06-30 MED ORDER — MUPIROCIN 2 % EX OINT
1.0000 | TOPICAL_OINTMENT | Freq: Two times a day (BID) | CUTANEOUS | Status: DC
  Administered 2022-07-01 – 2022-07-03 (×5): 1 via NASAL
  Filled 2022-06-30 (×3): qty 22

## 2022-06-30 MED ORDER — HYDROMORPHONE HCL 1 MG/ML IJ SOLN
1.0000 mg | INTRAMUSCULAR | Status: DC | PRN
  Administered 2022-06-30 – 2022-07-01 (×3): 1 mg via INTRAVENOUS
  Filled 2022-06-30 (×3): qty 1

## 2022-06-30 MED ORDER — ACETAMINOPHEN 500 MG PO TABS: 500.0000 mg | ORAL_TABLET | Freq: Four times a day (QID) | ORAL | Status: DC | PRN

## 2022-06-30 MED ORDER — BISACODYL 5 MG PO TBEC: 5.0000 mg | DELAYED_RELEASE_TABLET | Freq: Every day | ORAL | Status: DC | PRN

## 2022-06-30 MED ORDER — BACITRACIN ZINC 500 UNIT/GM EX OINT
TOPICAL_OINTMENT | Freq: Two times a day (BID) | CUTANEOUS | Status: DC
  Administered 2022-06-30: 1 via TOPICAL
  Filled 2022-06-30: qty 0.9
  Filled 2022-06-30: qty 28.4

## 2022-06-30 MED ORDER — ONDANSETRON HCL 4 MG PO TABS: 4.0000 mg | ORAL_TABLET | Freq: Four times a day (QID) | ORAL | Status: DC | PRN

## 2022-06-30 MED ORDER — ACETAMINOPHEN 650 MG RE SUPP: 650.0000 mg | Freq: Four times a day (QID) | RECTAL | Status: DC | PRN

## 2022-06-30 NOTE — ED Notes (Signed)
Wife at bedside.

## 2022-06-30 NOTE — TOC CAGE-AID Note (Signed)
Transition of Care [facility]) - CAGE-AID Screening   Patient Details  Name: Tyler Hendrix MRN: [id number] Date of Birth: 1955/05/21  Transition of Care [facility]) CM/SW Contact:    Katha Hamming, RN Phone [phone] 06/30/2022, 7:27 PM    CAGE-AID Screening:    Have You Ever Felt You Ought to Cut Down on Your Drinking or Drug Use?: No Have People Annoyed You By Critizing Your Drinking Or Drug Use?: No Have You Felt Bad Or Guilty About Your Drinking Or Drug Use?: No Have You Ever Had a Drink or Used Drugs First Thing In The Morning to Steady Your Nerves or to Get Rid of a Hangover?: No CAGE-AID Score: 0  Substance Abuse Education Offered: No (reports one glass of wine a night - no resources indicated)

## 2022-06-30 NOTE — Progress Notes (Signed)
Orthopedic Tech Progress Note Patient Details:  Tyler Hendrix June 03, 1955 [id number]  Musculoskeletal Traction Type of Traction: Bucks Skin Traction Traction Location: lle Traction Weight: 10 lbs   Post Interventions Patient Tolerated: Well Instructions Provided: Care of device, Adjustment of device  Trinna Post 06/30/2022, 8:58 PM

## 2022-06-30 NOTE — ED Notes (Signed)
Patient abrasions to LUE cleaned, bacitracin applied, and cover. Will continue to monitor

## 2022-06-30 NOTE — Progress Notes (Signed)
Chaplain stopped in to visit pt while rounding in ED.  Patient was alert and conversant.  Wife at his side.  Chaplain offered ministry of presence while listening to pt explain how he was hurt.  Pt asked for prayer that he not contract an infection in his wound and that it would heal quickly.  Chaplain offered prayer on these matters.  Vernell Morgans Chaplain

## 2022-06-30 NOTE — ED Notes (Signed)
Patient arrived  X-ray

## 2022-06-30 NOTE — ED Provider Notes (Signed)
[place] [facility] AT [facility] Provider Note   CSN: [id number] Arrival date & time: 06/30/22  1818     History  Chief Complaint  Patient presents with   Trauma   Fall    Tyler Hendrix is a 67 y.o. male presenting from home with fall off ladder today, dropping about 6 feet, while cutting a tree.  Ladder landed on left leg.  Deformity and pain to left upper leg. Patient presents by EMS to give him a small dose of fentanyl.  Patient reports some paresthesias in his upper thigh but not his lower leg.  He is on 81 mg aspirin but no other anticoagulation medicine.  He denies headache or loss of consciousness.  They deny that he was pinned beneath the ladder  HPI     Home Medications Prior to Admission medications   Medication Sig Start Date End Date Taking? Authorizing Provider  aspirin EC 81 MG tablet Take 81 mg by mouth daily. 11/16/18  Yes [provider]  atorvastatin (LIPITOR) 40 MG tablet Take 1 tablet by mouth daily 07/05/21  Yes [name], MD  carvedilol (COREG) 6.25 MG tablet Take 1 tablet (6.25 mg total) by mouth 2 (two) times daily with a meal. 07/05/21  Yes [name], MD  levothyroxine (SYNTHROID) 75 MCG tablet TAKE 1 TABLET BY MOUTH DAILY BEFORE BREAKFAST. 05/24/21 07/27/22 Yes Carlus Pavlov, MD  Multiple Vitamin (MULTIVITAMIN WITH MINERALS) TABS tablet Take 1 tablet by mouth daily.   Yes [provider]  sacubitril-valsartan (ENTRESTO) 24-26 MG Take 1 tablet by mouth 2 (two) times daily. 02/07/22 02/07/23 Yes Chrystie Nose, MD  spironolactone (ALDACTONE) 25 MG tablet TAKE 1/2 TABLET BY MOUTH ONCE A DAY 07/05/21 07/27/22 Yes [name], MD  traZODone (DESYREL) 50 MG tablet Take 1 tablet (50 mg total) by mouth at bedtime. Patient taking differently: Take 100 mg by mouth at bedtime. 07/05/21  Yes [name], MD  sildenafil (VIAGRA) 50 MG tablet Take 1 tablet (50 mg total) by mouth daily as needed for erectile  dysfunction. 05/24/20   [name], MD      Allergies    Other    Review of Systems   Review of Systems  Physical Exam Updated Vital Signs BP 126/70   Pulse (!) 57   Temp 98.5 F (36.9 C)   Resp 18   Ht 5\' 11"  (1.803 m)   Wt 86.6 kg   SpO2 96%   BMI 26.64 kg/m  Physical Exam Constitutional:      General: He is not in acute distress. HENT:     Head: Normocephalic and atraumatic.  Eyes:     Conjunctiva/sclera: Conjunctivae normal.     Pupils: Pupils are equal, round, and reactive to light.  Cardiovascular:     Rate and Rhythm: Normal rate and regular rhythm.     Pulses: Normal pulses.  Pulmonary:     Effort: Pulmonary effort is normal. No respiratory distress.  Abdominal:     General: There is no distension.     Tenderness: There is no abdominal tenderness.  Musculoskeletal:     Comments: Swelling and ecchymosis and tenderness of the left mid femur No visible deformity or injuries of the remaining extremities including the left lower extremity  Skin:    General: Skin is warm and dry.     Comments: About 1 cm abrasion x 2 to left arm  Neurological:     General: No  focal deficit present.     Mental Status: He is alert. Mental status is at baseline.     Sensory: No sensory deficit.  Psychiatric:        Mood and Affect: Mood normal.        Behavior: Behavior normal.     ED Results / Procedures / Treatments   Labs (all labs ordered are listed, but only abnormal results are displayed) Labs Reviewed  BASIC METABOLIC PANEL - Abnormal; Notable for the following components:      Result Value   Glucose, Bld 108 (*)    All other components within normal limits  CBC - Abnormal; Notable for the following components:   RBC 4.03 (*)    Hemoglobin 12.9 (*)    HCT 38.4 (*)    All other components within normal limits  PROTIME-INR  HIV ANTIBODY (ROUTINE TESTING W REFLEX)    EKG None  Radiology DG Knee Left Port  Result Date: 06/30/2022 CLINICAL DATA:  544450  Closed fracture of left distal femur (HCC) 544450 EXAM: PORTABLE LEFT KNEE - 1-2 VIEW COMPARISON:  None Available. FINDINGS: No evidence of fracture, dislocation, or joint effusion. No evidence of arthropathy or other focal bone abnormality. Soft tissues are unremarkable. IMPRESSION: Negative. Electronically Signed   By: Helyn Numbers M.D.   On: 06/30/2022 21:33   DG Femur Portable Min 2 Views Left  Result Date: 06/30/2022 CLINICAL DATA:  Fall off ladder. EXAM: LEFT FEMUR PORTABLE 2 VIEWS COMPARISON:  None Available. FINDINGS: Severely displaced and angulated fracture is seen involving the midportion of the left femoral shaft. Overlapping fracture fragments are noted. IMPRESSION: Severely displaced and angulated left femoral midshaft fracture is noted. Electronically Signed   By: Lupita Raider M.D.   On: 06/30/2022 19:14   DG Pelvis Portable  Result Date: 06/30/2022 CLINICAL DATA:  Fall off ladder. EXAM: PORTABLE PELVIS 1-2 VIEWS COMPARISON:  None Available. FINDINGS: There is no evidence of pelvic fracture or diastasis. No pelvic bone lesions are seen. IMPRESSION: Negative. Electronically Signed   By: Lupita Raider M.D.   On: 06/30/2022 19:13   DG Chest Portable 1 View  Result Date: 06/30/2022 CLINICAL DATA:  Fall off ladder. EXAM: PORTABLE CHEST 1 VIEW COMPARISON:  February 08, 2019. FINDINGS: Mild cardiomegaly is noted. Status post coronary artery bypass graft and mitral valve repair. Left-sided pacemaker is in grossly good position. Lungs are clear. Bony thorax is unremarkable. IMPRESSION: No acute abnormality seen. Electronically Signed   By: Lupita Raider M.D.   On: 06/30/2022 19:11    Procedures Procedures    Medications Ordered in ED Medications  bacitracin ointment (1 Application Topical Given 06/30/22 2005)  ondansetron (ZOFRAN) tablet 4 mg (has no administration in time range)    Or  ondansetron (ZOFRAN) injection 4 mg (has no administration in time range)  acetaminophen  (TYLENOL) tablet 500 mg (has no administration in time range)    Or  acetaminophen (TYLENOL) suppository 650 mg (has no administration in time range)  bisacodyl (DULCOLAX) EC tablet 5 mg (has no administration in time range)  HYDROmorphone (DILAUDID) injection 1 mg (has no administration in time range)  HYDROmorphone (DILAUDID) injection 1 mg (1 mg Intravenous Given 06/30/22 1828)  HYDROmorphone (DILAUDID) injection 1 mg (1 mg Intravenous Given 06/30/22 2005)    ED Course/ Medical Decision Making/ A&P Clinical Course as of 06/30/22 2152  Mon Jun 30, 2022  1930 Dr Aundria Rud Ortho requesting buck's traction 10 lbs for femur fracture, admit  to hospitalist, potential OR tomorrow [MT]    Clinical Course User Index [MT] Lamarkus Nebel, Kermit Balo, MD                             Medical Decision Making Amount and/or Complexity of Data Reviewed Labs: ordered. Radiology: ordered.  Risk OTC drugs. Prescription drug management. Decision regarding hospitalization.   Patient is presenting with mechanical fall off a ladder today and appears to have an isolated injury to the left hip or the left femur.  He is neurovascularly intact on arrival with a brisk pedal pulse and his distal extremity appears well-perfused.  His muscle compartment is soft on admission; low suspicion for acute compartment syndrome.  He does not have any other evident trauma on exam.  I have ordered Dilaudid for pain.  Supplemental history provided by EMS.  I personally reviewed and interpreted the patient's labs and imaging, notable for mid femur fracture of the left leg.  No pelvic fracture noted.  No intrathoracic injury.  Patient's labs are unremarkable and appear to be at baseline levels. No evidence of significant blood loss anemia.  Additional pain medications were given once.  Patient remains neurovascularly intact on my reassessment.  I consulted with orthopedics, see ED course, recommending n.p.o. at midnight in Buck's  traction.  At this time I do not see any evidence or have any report of head injury, spinal fracture or trauma.  Low suspicion for intra-abdominal injury to warrant CT imaging at this time.  The remainder of his trauma evaluation has been unremarkable, with only superficial abrasion to the left upper arm, which can be cleaned and bacitracin applied.  Given that there was not concern for multiple traumas, active hemorrhage, or compartment syndrome, I do not see an emergent indication for trauma consultation at this time.  The patient was admitted to the hospitalist service with plan for orthopedic consultation and potential OR tomorrow.          Final Clinical Impression(s) / ED Diagnoses Final diagnoses:  Closed fracture of left femur, unspecified fracture morphology, unspecified portion of femur, initial encounter [facility])    Rx / DC Orders ED Discharge Orders     None         Terald Sleeper, MD 06/30/22 2156

## 2022-06-30 NOTE — ED Notes (Signed)
Trauma Response Nurse Documentation   Tyler Hendrix is a 67 y.o. male arriving to [facility] ED via EMS  Trauma was activated as a Level 2 based on the following trauma criteria Stable femur, humerus, or pelvic fracture via any mechanism except GLF. GCS 15.  History   Past Medical History:  Diagnosis Date   Abnormal TSH    Arthritis    ddd   Atrial fibrillation and flutter (HCC)    s/p DCCV 03/26/2018   CAD in native artery    a. 11/2015: s/p 2 vessel PCI of the first diagonal branch of the LAD in the mid left circumflex with DES. // s/p CABG 01/2018   Chronic combined systolic and diastolic CHF (congestive heart failure) (HCC)    sees Dr. Johney Frame    Complete heart block (HCC)    a. s/p Medtronic ppm 10/2015.   History of radiation therapy 2002   Hodgkin disease (HCC) 2002   a. s/p chest radiation and chemotherapy   Hypothyroidism    sees Dr. 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/27/2018   29 mm [facility] Mitral stented bovine pericardial tissue valve     Past Surgical History:  Procedure Laterality Date   A-FLUTTER ABLATION N/A 06/20/2021   Procedure: A-FLUTTER ABLATION;  Surgeon: Marinus Maw, MD;  [facility]: [facility] INVASIVE CV LAB;  Service: Cardiovascular;  Laterality: N/A;   BIV UPGRADE N/A 02/08/2019   Procedure: UPGRADE TO BIV PPM;  Surgeon: Hillis Range, MD;  Location: [facility] INVASIVE CV LAB;  Service: Cardiovascular;  Laterality: N/A;   CARDIAC CATHETERIZATION N/A 11/02/2015   Procedure: Left Heart Cath and Coronary Angiography;  Surgeon: Tonny Bollman, MD;  Location: [facility] INVASIVE CV LAB;  Service:  Cardiovascular;  Laterality: N/A;   CARDIAC CATHETERIZATION N/A 11/21/2015   Procedure: Coronary Stent Intervention;  Surgeon: Tonny Bollman, MD;  Location: [facility] INVASIVE CV LAB;  Service: Cardiovascular;  Laterality: N/A;   CARDIAC CATHETERIZATION  11/2017   CARDIOVERSION N/A 03/26/2018   Procedure: CARDIOVERSION;  Surgeon: Parke Poisson, MD;  Location[facility] ENDOSCOPY;  Service: Cardiovascular;  Laterality: N/A;   COLONOSCOPY  01/16/2015   per Dr. Christella Hartigan, benign polyps, repeat in 10 yrs    CORONARY ARTERY BYPASS GRAFT N/A 01/27/2018   Procedure: CORONARY ARTERY BYPASS GRAFTING (CABG) x two, using left internal mammary artery and right leg greater saphenous vein harvested endoscopically;  Surgeon: Purcell Nails, MD;  Location: [facility] OR;  Service: Open Heart Surgery;  Laterality: N/A;   EP IMPLANTABLE DEVICE N/A 11/02/2015   MDT Adivisa MRI conditional dual chamber pacemaker implanted by Dr Johney Frame for complete heart block   KNEE SURGERY Right 1972   LUMBAR LAMINECTOMY  1996   MITRAL VALVE REPLACEMENT N/A 01/27/2018   Procedure: MITRAL VALVE (MV) REPLACEMENT using Magna Mitral Ease Valve Size ;  Surgeon: Purcell Nails, MD;  Location: [facility] OR;  Service: Open Heart Surgery;  Laterality: N/A;   RIGHT/LEFT HEART CATH AND CORONARY ANGIOGRAPHY N/A 12/02/2017   Procedure: RIGHT/LEFT HEART CATH AND CORONARY ANGIOGRAPHY;  Surgeon: Corky Crafts, MD;  Location:  [facility] INVASIVE CV LAB;  Service: Cardiovascular;  Laterality: N/A;   TEE WITHOUT CARDIOVERSION N/A 12/09/2017   Procedure: TRANSESOPHAGEAL ECHOCARDIOGRAM (TEE);  Surgeon: Jodelle Red, MD;  Location: [facility] ENDOSCOPY;  Service: Cardiovascular;  Laterality: N/A;   TEE WITHOUT CARDIOVERSION N/A 01/27/2018   Procedure: TRANSESOPHAGEAL ECHOCARDIOGRAM (TEE);  Surgeon: Purcell Nails, MD;  Location: [facility] OR;  Service: Open Heart Surgery;  Laterality: N/A;     Initial Focused Assessment (If applicable, or please see trauma  documentation): Patient A&Ox4, GCS 15, PERR 3 Airway intact, bilateral breath sounds equal Pulses 2+, including LLE  CT's Completed:   None ordered in initial workup  Interventions:  IV, labs CXR/PXR/Extremity XRs  Plan for disposition:  Admission to floor   Consults completed:  Orthopaedic Surgeon at see orders.  Event Summary: Patient to ED after falling from ladder while cutting down tree. Per patient he was on the ladder with a chainsaw cutting through a tree when the tree "took a turn", either hitting him or the ladder. He was able to throw the chainsaw, immediately knew his leg was probably broken. Imaging revealed left mid-shaft femur fracture. Orthopedics consulted. Patients wife at bedside, works at [employer] in patients.  Bedside handoff with ED RN Glee Arvin.    Jill Side Aquila Delaughter  Trauma Response RN  Please call TRN at [phone] for further assistance.

## 2022-06-30 NOTE — ED Notes (Signed)
ED TO INPATIENT HANDOFF REPORT  ED Nurse Name and Phone #:   S Name/Age/Gender Tyler Hendrix 67 y.o. male Room/Bed: 002C/002C  Code Status   Code Status: Full Code  Home/SNF/Other Home Patient oriented to: self, place, time, and situation Is this baseline? Yes   Triage Complete: Triage complete  Chief Complaint Femur fracture [facility]) [S72.90XA]  Triage Note Patient BIB EMS Home. Patient cutting down tree and up on ladder 5 to 6 feet jump from ladder landed on left upper leg. No pain/ numbness to  left thigh.  VSS. NO LOC.    Allergies Allergies  Allergen Reactions   Other     Pt reported allergic to dust mites that causes of sneezing, runny nose    Level of Care/Admitting Diagnosis ED Disposition     ED Disposition  Admit   Condition  --   Comment  Hospital Area: [facility] [[id number]]  Level of Care: Med-Surg [16]  May admit patient to [facility] or [facility] if equivalent level of care is available:: No  Covid Evaluation: Asymptomatic - no recent exposure (last 10 days) testing not required  Diagnosis: Femur fracture [facility]) [213086]  Admitting Physician: Buena Irish [[id number]]  Attending Physician: Buena Irish [phone]  Certification:: I certify this patient will need inpatient services for at least 2 midnights  Estimated Length of Stay: 4          B Medical/Surgery History Past Medical History:  Diagnosis Date   Abnormal TSH    Arthritis    ddd   Atrial fibrillation and flutter (HCC)    s/p DCCV 03/26/2018   CAD in native artery    a. 11/2015: s/p 2 vessel PCI of the first diagonal branch of the LAD in the mid left circumflex with DES. // s/p CABG 01/2018   Chronic combined systolic and diastolic CHF (congestive heart failure) (HCC)    sees Dr. Johney Frame    Complete heart block (HCC)    a. s/p Medtronic ppm 10/2015.   History of radiation therapy 2002   Hodgkin disease (HCC) 2002   a. s/p chest radiation and chemotherapy    Hypothyroidism    sees Dr. 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/27/2018   29 mm [facility] Mitral stented bovine pericardial tissue valve   Past Surgical History:  Procedure Laterality Date   A-FLUTTER ABLATION N/A 06/20/2021   Procedure: A-FLUTTER ABLATION;  Surgeon: Marinus Maw, MD;  [facility] INVASIVE CV LAB;  Service: Cardiovascular;  Laterality: N/A;   BIV UPGRADE N/A 02/08/2019   Procedure: UPGRADE TO BIV PPM;  Surgeon: Hillis Range, MD;  Location: [facility] INVASIVE CV LAB;  Service: Cardiovascular;  Laterality: N/A;   CARDIAC CATHETERIZATION N/A 11/02/2015   Procedure: Left Heart Cath and Coronary Angiography;  Surgeon: Tonny Bollman, MD;  Location: [facility] INVASIVE CV LAB;  Service: Cardiovascular;  Laterality: N/A;   CARDIAC CATHETERIZATION N/A 11/21/2015   Procedure: Coronary Stent Intervention;  Surgeon: Tonny Bollman, MD;  Location: [facility] INVASIVE CV LAB;  Service: Cardiovascular;  Laterality: N/A;   CARDIAC CATHETERIZATION  11/2017   CARDIOVERSION N/A 03/26/2018   Procedure: CARDIOVERSION;  Surgeon: Parke Poisson, MD;  Location: [facility] ENDOSCOPY;  Service: Cardiovascular;  Laterality: N/A;   COLONOSCOPY  01/16/2015   per Dr. Christella Hartigan, benign polyps, repeat in 10 yrs    CORONARY ARTERY BYPASS GRAFT N/A 01/27/2018   Procedure: CORONARY ARTERY BYPASS GRAFTING (CABG) x two, using left internal mammary artery and right leg greater saphenous vein harvested endoscopically;  Surgeon: Purcell Nails, MD;  Location: [facility] OR;  Service: Open Heart Surgery;  Laterality: N/A;   EP IMPLANTABLE DEVICE N/A 11/02/2015   MDT Adivisa MRI  conditional dual chamber pacemaker implanted by Dr Johney Frame for complete heart block   KNEE SURGERY Right 1972   LUMBAR LAMINECTOMY  1996   MITRAL VALVE REPLACEMENT N/A 01/27/2018   Procedure: MITRAL VALVE (MV) REPLACEMENT using Magna Mitral Ease Valve Size ;  Surgeon: Purcell Nails, MD;  Location: [facility] OR;  Service: Open Heart Surgery;  Laterality: N/A;   RIGHT/LEFT HEART CATH AND CORONARY ANGIOGRAPHY N/A 12/02/2017   Procedure: RIGHT/LEFT HEART CATH AND CORONARY ANGIOGRAPHY;  Surgeon: Corky Crafts, MD;  Location: [facility] INVASIVE CV LAB;  Service: Cardiovascular;  Laterality: N/A;   TEE WITHOUT CARDIOVERSION N/A 12/09/2017   Procedure: TRANSESOPHAGEAL ECHOCARDIOGRAM (TEE);  Surgeon: Jodelle Red, MD;  Location: [facility] ENDOSCOPY;  Service: Cardiovascular;  Laterality: N/A;   TEE WITHOUT CARDIOVERSION N/A 01/27/2018   Procedure: TRANSESOPHAGEAL ECHOCARDIOGRAM (TEE);  Surgeon: Purcell Nails, MD;  Location: [facility] OR;  Service: Open Heart Surgery;  Laterality: N/A;     A IV Location/Drains/Wounds Patient Lines/Drains/Airways Status     Active Line/Drains/Airways     Name Placement date Placement time Site Days   Peripheral IV 06/30/22 20 G Posterior;Right Forearm 06/30/22  1828  Forearm  less than 1   Sheath 06/20/21 Right Femoral;Venous 06/20/21  1018  Femoral;Venous  375   Sheath 06/20/21 Right Femoral;Venous 06/20/21  1019  Femoral;Venous  375   Sheath Right Femoral;Venous --  --  Femoral;Venous  --   Incision (Closed) 01/27/18 Chest Other (Comment) 01/27/18  1141  -- 1615   Incision (Closed) 01/27/18 Leg Right 01/27/18  1141  -- 1615            Intake/Output Last 24 hours No intake or output data in the 24 hours ending 06/30/22 2116  Labs/Imaging Results for orders placed or performed during the hospital encounter of 06/30/22 (from the past 48 hour(s))  Basic metabolic panel     Status: Abnormal   Collection Time: 06/30/22  6:45 PM  Result Value Ref Range    Sodium 136 135 - 145 mmol/L   Potassium 4.3 3.5 - 5.1 mmol/L   Chloride 104 98 - 111 mmol/L   CO2 24 22 - 32 mmol/L   Glucose, Bld 108 (H) 70 - 99 mg/dL    Comment: Glucose reference range applies only to samples taken after fasting for at least 8 hours.   BUN 18 8 - 23 mg/dL   Creatinine, Ser 2.13 0.61 - 1.24 mg/dL   Calcium 9.0 8.9 - 08.6 mg/dL   GFR, Estimated >57 >84 mL/min    Comment: (NOTE) Calculated using the CKD-EPI Creatinine Equation (2021)    Anion gap 8 5 - 15    Comment: Performed at [facility] Lab, [address], [place], [place] [address]  CBC     Status: Abnormal   Collection Time: 06/30/22  6:45 PM  Result Value Ref Range   WBC 8.8  4.0 - 10.5 K/uL   RBC 4.03 (L) 4.22 - 5.81 MIL/uL   Hemoglobin 12.9 (L) 13.0 - 17.0 g/dL   HCT 16.1 (L) 09.6 - 04.5 %   MCV 95.3 80.0 - 100.0 fL   MCH 32.0 26.0 - 34.0 pg   MCHC 33.6 30.0 - 36.0 g/dL   RDW 40.9 81.1 - 91.4 %   Platelets 312 150 - 400 K/uL   nRBC 0.0 0.0 - 0.2 %    Comment: Performed at [facility] Lab, [address] [place], [place] [address]  Protime-INR     Status: None   Collection Time: 06/30/22  6:45 PM  Result Value Ref Range   Prothrombin Time 14.6 11.4 - 15.2 seconds   INR 1.1 0.8 - 1.2    Comment: (NOTE) INR goal varies based on device and disease states. Performed at [facility] Lab, [address] [place], [place] [address]    DG Femur Portable Min 2 Views Left  Result Date: 06/30/2022 CLINICAL DATA:  Fall off ladder. EXAM: LEFT FEMUR PORTABLE 2 VIEWS COMPARISON:  None Available. FINDINGS: Severely displaced and angulated fracture is seen involving the midportion of the left femoral shaft. Overlapping fracture fragments are noted. IMPRESSION: Severely displaced and angulated left femoral midshaft fracture is noted. Electronically Signed   By: Lupita Raider M.D.   On: 06/30/2022 19:14   DG Pelvis Portable  Result Date: 06/30/2022 CLINICAL DATA:  Fall off ladder. EXAM: PORTABLE PELVIS  1-2 VIEWS COMPARISON:  None Available. FINDINGS: There is no evidence of pelvic fracture or diastasis. No pelvic bone lesions are seen. IMPRESSION: Negative. Electronically Signed   By: Lupita Raider M.D.   On: 06/30/2022 19:13   DG Chest Portable 1 View  Result Date: 06/30/2022 CLINICAL DATA:  Fall off ladder. EXAM: PORTABLE CHEST 1 VIEW COMPARISON:  February 08, 2019. FINDINGS: Mild cardiomegaly is noted. Status post coronary artery bypass graft and mitral valve repair. Left-sided pacemaker is in grossly good position. Lungs are clear. Bony thorax is unremarkable. IMPRESSION: No acute abnormality seen. Electronically Signed   By: Lupita Raider M.D.   On: 06/30/2022 19:11    Pending Labs Unresulted Labs (From admission, onward)     Start     Ordered   06/30/22 2105  HIV Antibody (routine testing w rflx)  (HIV Antibody (Routine testing w reflex) panel)  Once,   R        06/30/22 2112            Vitals/Pain Today's Vitals   06/30/22 1826 06/30/22 1827 06/30/22 1830 06/30/22 1830  BP:  (!) 143/76 126/70   Pulse:  (!) 56 (!) 57   Resp:   18   Temp:      SpO2:  96% 96%   Weight:    86.6 kg  Height:    5\' 11"  (1.803 m)  PainSc: 2        Isolation Precautions No active isolations  Medications Medications  bacitracin ointment (1 Application Topical Given 06/30/22 2005)  ondansetron (ZOFRAN) tablet 4 mg (has no administration in time range)    Or  ondansetron (ZOFRAN) injection 4 mg (has no administration in time range)  acetaminophen (TYLENOL) tablet 500 mg (has no administration in time range)    Or  acetaminophen (TYLENOL) suppository 650 mg (has no administration in time range)  bisacodyl (DULCOLAX) EC tablet 5 mg (has no administration in time range)  HYDROmorphone (DILAUDID) injection 1 mg (1  mg Intravenous Given 06/30/22 1828)  HYDROmorphone (DILAUDID) injection 1 mg (1 mg Intravenous Given 06/30/22 2005)    Mobility non-ambulatory     Focused  Assessments    R Recommendations: See Admitting Provider Note  Report given to:   Additional Notes: NPO midnight/ Surgery tomorrow

## 2022-06-30 NOTE — ED Notes (Signed)
Lab and ortho tech arrived

## 2022-06-30 NOTE — ED Notes (Addendum)
Patient BIB EMS Home. Patient cutting down tree and up on ladder 5 to10 feet jump from ladder landed on left upper leg. No pain/ numbness to  left thigh.  VSS. NO LOC.

## 2022-06-30 NOTE — ED Triage Notes (Signed)
Patient BIB EMS Home. Patient cutting down tree and up on ladder 5 to 6 feet jump from ladder landed on left upper leg. No pain/ numbness to  left thigh.  VSS. NO LOC.

## 2022-06-30 NOTE — Consult Note (Signed)
ORTHOPAEDIC CONSULTATION  REQUESTING PHYSICIAN: Buena Irish, MD  PCP:  [name], MD  Chief Complaint: Fall from ladder  HPI: Tyler Hendrix is a 67 y.o. male who complains of left femur pain and cramping as well as some deformity and swelling following a fall earlier today while doing some tree work on a ladder.  He states that the tree limb he was working on twisted in an awkward way and felt that collided with the left side of his thigh.  He felt a snap at that time.  He landed on the ground.  He did have his phone with him and called for help.  He otherwise denies any major medical comorbidity.  He does have right bundle branch block and has a pacemaker implanted.  Not currently on any anticoagulation.  Does have a remote history of atrial fibrillation and a flutter.  No recent or remote history of left leg trauma or surgery.  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  Denies diabetes or smoking.  Past Medical History:  Diagnosis Date   Abnormal TSH    Arthritis    ddd   Atrial fibrillation and flutter (HCC)    s/p DCCV 03/26/2018   CAD in native artery    a. 11/2015: s/p 2 vessel PCI of the first diagonal branch of the LAD in the mid left circumflex with DES. // s/p CABG 01/2018   Chronic combined systolic and diastolic CHF (congestive heart failure) (HCC)    sees Dr. Johney Frame    Complete heart block (HCC)    a. s/p Medtronic ppm 10/2015.   History of radiation therapy 2002   Hodgkin disease (HCC) 2002   a. s/p chest radiation and chemotherapy   Hypothyroidism    sees Dr. Elvera Lennox    Incidental pulmonary nodule 01/19/2018   Left apical opacity - possible scarring   Mitral regurgitation    s/p bioprosthetic MV replacement 01/2018   Pacemaker 10/2015   Medtronic   PVC's (premature ventricular contractions)    Right bundle branch block    S/P CABG x 2 01/27/2018   LIMA to LAD, SVG to RCA, EVH via right thigh   S/P mitral valve replacement with bioprosthetic  valve 01/27/2018   29 mm [facility] Mitral stented bovine pericardial tissue valve   Past Surgical History:  Procedure Laterality Date   A-FLUTTER ABLATION N/A 06/20/2021   Procedure: A-FLUTTER ABLATION;  Surgeon: Marinus Maw, MD;  Location: [facility] INVASIVE CV LAB;  Service: Cardiovascular;  Laterality: N/A;   BIV UPGRADE N/A 02/08/2019   Procedure: UPGRADE TO BIV PPM;  Surgeon: Hillis Range, MD;  Location: [facility] INVASIVE CV LAB;  Service: Cardiovascular;  Laterality: N/A;   CARDIAC CATHETERIZATION N/A 11/02/2015   Procedure: Left Heart Cath and Coronary Angiography;  Surgeon: Tonny Bollman, MD;  Location: [facility] INVASIVE CV LAB;  Service: Cardiovascular;  Laterality: N/A;   CARDIAC CATHETERIZATION N/A 11/21/2015   Procedure: Coronary Stent Intervention;  Surgeon: Tonny Bollman, MD;  Location: [facility] INVASIVE CV LAB;  Service: Cardiovascular;  Laterality: N/A;   CARDIAC CATHETERIZATION  11/2017   CARDIOVERSION N/A 03/26/2018   Procedure: CARDIOVERSION;  Surgeon: Parke Poisson, MD;  Location: [facility] ENDOSCOPY;  Service: Cardiovascular;  Laterality: N/A;   COLONOSCOPY  01/16/2015   per Dr. Christella Hartigan, benign polyps, repeat in 10 yrs    CORONARY ARTERY BYPASS GRAFT N/A 01/27/2018   Procedure: CORONARY ARTERY BYPASS GRAFTING (CABG) x two, using left internal mammary artery and right leg  greater saphenous vein harvested endoscopically;  Surgeon: Purcell Nails, MD;  Location: [facility] OR;  Service: Open Heart Surgery;  Laterality: N/A;   EP IMPLANTABLE DEVICE N/A 11/02/2015   MDT Adivisa MRI conditional dual chamber pacemaker implanted by Dr Johney Frame for complete heart block   KNEE SURGERY Right 1972   LUMBAR LAMINECTOMY  1996   MITRAL VALVE REPLACEMENT N/A 01/27/2018   Procedure: MITRAL VALVE (MV) REPLACEMENT using Magna Mitral Ease Valve Size ;  Surgeon: Purcell Nails, MD;  Location[facility] OR;  Service: Open Heart Surgery;  Laterality: N/A;   RIGHT/LEFT HEART CATH AND CORONARY ANGIOGRAPHY N/A 12/02/2017    Procedure: RIGHT/LEFT HEART CATH AND CORONARY ANGIOGRAPHY;  Surgeon: Corky Crafts, MD;  Location: [facility] INVASIVE CV LAB;  Service: Cardiovascular;  Laterality: N/A;   TEE WITHOUT CARDIOVERSION N/A 12/09/2017   Procedure: TRANSESOPHAGEAL ECHOCARDIOGRAM (TEE);  Surgeon: Jodelle Red, MD;  Location: [facility] ENDOSCOPY;  Service: Cardiovascular;  Laterality: N/A;   TEE WITHOUT CARDIOVERSION N/A 01/27/2018   Procedure: TRANSESOPHAGEAL ECHOCARDIOGRAM (TEE);  Surgeon: Purcell Nails, MD;  Location: [facility] OR;  Service: Open Heart Surgery;  Laterality: N/A;   Social History   Socioeconomic History   Marital status: Married    Spouse name: Not on file   Number of children: 2   Years of education: Masters   Highest education level: Not on file  Occupational History   Occupation: lawyer    Comment: [employer]  Tobacco Use   Smoking status: Never   Smokeless tobacco: Never  Vaping Use   Vaping Use: Never used  Substance and Sexual Activity   Alcohol use: Yes    Alcohol/week: 5.0 standard drinks of alcohol    Types: 5 Glasses of wine per week   Drug use: No   Sexual activity: Not on file  Other Topics Concern   Not on file  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[place]   Social Determinants of Health   Financial Resource Strain: Not on file  Food Insecurity: Not on file  Transportation Needs: Not on file  Physical Activity: Not on file  Stress: Not on file  Social Connections: Not on file   Family History  Problem Relation Age of Onset   Cancer Other        prostate father hx   Heart disease Mother    Heart disease Father    Colon cancer Neg Hx    Allergies  Allergen Reactions   Other     Pt reported allergic to dust mites that causes of sneezing, runny nose   Prior to Admission medications   Medication Sig Start Date End Date Taking? Authorizing Provider  aspirin EC 81 MG tablet Take 81 mg by mouth daily. 11/16/18  Yes [provider]  atorvastatin (LIPITOR) 40 MG tablet Take 1 tablet by mouth daily 07/05/21  Yes [name], MD  carvedilol (COREG) 6.25 MG tablet Take 1 tablet (6.25 mg total) by mouth 2 (two) times daily with a meal. 07/05/21  Yes [name], MD  levothyroxine (SYNTHROID) 75 MCG tablet TAKE 1 TABLET BY MOUTH DAILY BEFORE BREAKFAST. 05/24/21 07/27/22 Yes Carlus Pavlov, MD  Multiple Vitamin (MULTIVITAMIN WITH MINERALS) TABS tablet Take 1 tablet by mouth daily.   Yes [provider]  sacubitril-valsartan (ENTRESTO) 24-26 MG Take 1 tablet by mouth 2 (two) times daily. 02/07/22 02/07/23 Yes Hilty, Lisette Abu, MD  spironolactone (ALDACTONE) 25 MG tablet TAKE 1/2  TABLET BY MOUTH ONCE A DAY 07/05/21 07/27/22 Yes [name], MD  traZODone (DESYREL) 50 MG tablet Take 1 tablet (50 mg total) by mouth at bedtime. Patient taking differently: Take 100 mg by mouth at bedtime. 07/05/21  Yes [name], MD  sildenafil (VIAGRA) 50 MG tablet Take 1 tablet (50 mg total) by mouth daily as needed for erectile dysfunction. 05/24/20   [name], MD   DG Knee Left Port  Result Date: 06/30/2022 CLINICAL DATA:  161096 Closed fracture of left distal femur (HCC) 544450 EXAM: PORTABLE LEFT KNEE - 1-2 VIEW COMPARISON:  None Available. FINDINGS: No evidence of fracture, dislocation, or joint effusion. No evidence of arthropathy or other focal bone abnormality. Soft tissues are unremarkable. IMPRESSION: Negative. Electronically Signed   By: Helyn Numbers M.D.   On: 06/30/2022 21:33   DG Femur Portable Min 2 Views Left  Result Date: 06/30/2022 CLINICAL DATA:  Fall off ladder. EXAM: LEFT FEMUR PORTABLE 2 VIEWS COMPARISON:  None Available. FINDINGS: Severely displaced and angulated fracture is seen involving the midportion of the left femoral shaft. Overlapping fracture fragments are noted. IMPRESSION: Severely displaced and angulated left femoral midshaft fracture is noted. Electronically Signed   By: Lupita Raider M.D.   On: 06/30/2022 19:14   DG Pelvis Portable  Result Date: 06/30/2022 CLINICAL DATA:  Fall off ladder. EXAM: PORTABLE PELVIS 1-2 VIEWS COMPARISON:  None Available. FINDINGS: There is no evidence of pelvic fracture or diastasis. No pelvic bone lesions are seen. IMPRESSION: Negative. Electronically Signed   By: Lupita Raider M.D.   On: 06/30/2022 19:13   DG Chest Portable 1 View  Result Date: 06/30/2022 CLINICAL DATA:  Fall off ladder. EXAM: PORTABLE CHEST 1 VIEW COMPARISON:  February 08, 2019. FINDINGS: Mild cardiomegaly is noted. Status post coronary artery bypass graft and mitral valve repair. Left-sided pacemaker is in grossly good position. Lungs are clear. Bony thorax is unremarkable. IMPRESSION: No acute abnormality seen. Electronically Signed   By: Lupita Raider M.D.   On: 06/30/2022 19:11    Positive ROS: All other systems have been reviewed and were otherwise negative with the exception of those mentioned in the HPI and as above.  Physical Exam: General: Alert, no acute distress Cardiovascular: No pedal edema Respiratory: No cyanosis, no use of accessory musculature GI: No organomegaly, abdomen is soft and non-tender Skin: No lesions in the area of chief complaint Neurologic: Sensation intact distally Psychiatric: Patient is competent for consent with normal mood and affect Lymphatic: No axillary or cervical lymphadenopathy  MUSCULOSKELETAL: Left lower extremity demonstrates some fullness in the thigh consistent with fracture hematoma.  However the thigh and anterior and posterior is soft.  Calf soft nontender with no signs of compartment syndrome.  Neurovascular intact distally with EHL, FHL, tib ant and gastrocnemius.  Good 2+ dorsalis pedis pulse.  Assessment: Left closed midshaft femur fracture  Plan: Plan for Buck's traction tonight.  This will help with some muscle spasticity.  Also ice and muscle relaxation and narcotics as needed.  At this time he can have  a diet until midnight, will need surgical management tomorrow with the orthopedic trauma colleague Dr. Myrene Galas.  I discussed that recommendation in detail.  Plan for the orthopedic trauma team to assume care tomorrow.  Appreciate the medicine team.    Yolonda Kida, MD Cell [phone]    06/30/2022 10:27 PM

## 2022-06-30 NOTE — ED Notes (Signed)
Patient currently talking on cell phone, A&Ox4 no confusion noted. Wife at bedside. Denies pain. Will continue to monitor

## 2022-06-30 NOTE — Progress Notes (Signed)
Orthopedic Tech Progress Note Patient Details:  DIONYSIUS CAMBRAY April 09, 1955 [id number]  Level 2 trauma, not needed at  this moment   Patient ID: KILAN SUPAK, male   DOB: 1955/10/27, 67 y.o.   MRN: [id number]  Donald Pore 06/30/2022, 6:47 PM

## 2022-07-01 ENCOUNTER — Inpatient Hospital Stay (HOSPITAL_COMMUNITY): Payer: 59

## 2022-07-01 ENCOUNTER — Encounter: Payer: Self-pay | Admitting: Internal Medicine

## 2022-07-01 ENCOUNTER — Encounter (HOSPITAL_COMMUNITY)
Admission: EM | Disposition: A | Discharge status: Home Health | Payer: Self-pay | Source: Home / Self Care | Attending: Internal Medicine

## 2022-07-01 ENCOUNTER — Other Ambulatory Visit: Payer: Self-pay

## 2022-07-01 ENCOUNTER — Encounter (HOSPITAL_COMMUNITY): Payer: Self-pay | Admitting: Internal Medicine

## 2022-07-01 ENCOUNTER — Inpatient Hospital Stay (HOSPITAL_COMMUNITY): Payer: 59 | Admitting: Certified Registered"

## 2022-07-01 DIAGNOSIS — I519 Heart disease, unspecified: Secondary | ICD-10-CM | POA: Diagnosis not present

## 2022-07-01 DIAGNOSIS — Z951 Presence of aortocoronary bypass graft: Secondary | ICD-10-CM

## 2022-07-01 DIAGNOSIS — I427 Cardiomyopathy due to drug and external agent: Secondary | ICD-10-CM

## 2022-07-01 DIAGNOSIS — Z01818 Encounter for other preprocedural examination: Secondary | ICD-10-CM

## 2022-07-01 DIAGNOSIS — E039 Hypothyroidism, unspecified: Secondary | ICD-10-CM

## 2022-07-01 DIAGNOSIS — I2510 Atherosclerotic heart disease of native coronary artery without angina pectoris: Secondary | ICD-10-CM

## 2022-07-01 DIAGNOSIS — S7292XA Unspecified fracture of left femur, initial encounter for closed fracture: Secondary | ICD-10-CM | POA: Diagnosis not present

## 2022-07-01 DIAGNOSIS — I110 Hypertensive heart disease with heart failure: Secondary | ICD-10-CM | POA: Diagnosis not present

## 2022-07-01 DIAGNOSIS — I501 Left ventricular failure: Secondary | ICD-10-CM | POA: Diagnosis not present

## 2022-07-01 DIAGNOSIS — S72302A Unspecified fracture of shaft of left femur, initial encounter for closed fracture: Secondary | ICD-10-CM

## 2022-07-01 HISTORY — PX: FEMUR IM NAIL: SHX1597

## 2022-07-01 LAB — HIV ANTIBODY (ROUTINE TESTING W REFLEX): HIV Screen 4th Generation wRfx: NONREACTIVE

## 2022-07-01 LAB — CBC
HCT: 34 % — ABNORMAL LOW (ref 39.0–52.0)
Hemoglobin: 11.4 g/dL — ABNORMAL LOW (ref 13.0–17.0)
MCH: 32.2 pg (ref 26.0–34.0)
MCHC: 33.5 g/dL (ref 30.0–36.0)
MCV: 96 fL (ref 80.0–100.0)
Platelets: 293 10*3/uL (ref 150–400)
RBC: 3.54 MIL/uL — ABNORMAL LOW (ref 4.22–5.81)
RDW: 13.4 % (ref 11.5–15.5)
WBC: 19.6 10*3/uL — ABNORMAL HIGH (ref 4.0–10.5)
nRBC: 0 % (ref 0.0–0.2)

## 2022-07-01 LAB — CREATININE, SERUM
Creatinine, Ser: 1.41 mg/dL — ABNORMAL HIGH (ref 0.61–1.24)
GFR, Estimated: 55 mL/min — ABNORMAL LOW (ref >60)

## 2022-07-01 LAB — SURGICAL PCR SCREEN
MRSA, PCR: NEGATIVE
Staphylococcus aureus: POSITIVE — AB

## 2022-07-01 SURGERY — INSERTION, INTRAMEDULLARY ROD, FEMUR
Anesthesia: General | Site: Leg Upper | Laterality: Left

## 2022-07-01 MED ORDER — CEFAZOLIN SODIUM-DEXTROSE 2-4 GM/100ML-% IV SOLN
2.0000 g | INTRAVENOUS | Status: AC
  Administered 2022-07-01: 2 g via INTRAVENOUS
  Filled 2022-07-01: qty 100

## 2022-07-01 MED ORDER — FENTANYL CITRATE (PF) 100 MCG/2ML IJ SOLN
INTRAMUSCULAR | Status: DC | PRN
  Administered 2022-07-01 (×2): 50 ug via INTRAVENOUS

## 2022-07-01 MED ORDER — ONDANSETRON HCL 4 MG/2ML IJ SOLN
INTRAMUSCULAR | Status: AC
  Filled 2022-07-01: qty 2

## 2022-07-01 MED ORDER — OXYCODONE HCL 5 MG PO TABS
10.0000 mg | ORAL_TABLET | ORAL | Status: DC | PRN
  Administered 2022-07-02: 15 mg via ORAL
  Filled 2022-07-01 (×2): qty 3

## 2022-07-01 MED ORDER — CHLORHEXIDINE GLUCONATE 0.12 % MT SOLN
OROMUCOSAL | Status: AC
  Administered 2022-07-01: 15 mL via OROMUCOSAL
  Filled 2022-07-01: qty 15

## 2022-07-01 MED ORDER — SUCCINYLCHOLINE CHLORIDE 200 MG/10ML IV SOSY
PREFILLED_SYRINGE | INTRAVENOUS | Status: AC
  Filled 2022-07-01: qty 10

## 2022-07-01 MED ORDER — ONDANSETRON HCL 4 MG/2ML IJ SOLN: 4.0000 mg | Freq: Four times a day (QID) | INTRAMUSCULAR | Status: DC | PRN

## 2022-07-01 MED ORDER — ENOXAPARIN SODIUM 40 MG/0.4ML IJ SOSY
40.0000 mg | PREFILLED_SYRINGE | INTRAMUSCULAR | Status: DC
  Administered 2022-07-02 – 2022-07-03 (×2): 40 mg via SUBCUTANEOUS
  Filled 2022-07-01 (×2): qty 0.4

## 2022-07-01 MED ORDER — EPHEDRINE SULFATE-NACL 50-0.9 MG/10ML-% IV SOSY
PREFILLED_SYRINGE | INTRAVENOUS | Status: DC | PRN
  Administered 2022-07-01: 10 mg via INTRAVENOUS
  Administered 2022-07-01: 5 mg via INTRAVENOUS

## 2022-07-01 MED ORDER — CEFAZOLIN SODIUM-DEXTROSE 2-4 GM/100ML-% IV SOLN
2.0000 g | Freq: Four times a day (QID) | INTRAVENOUS | Status: AC
  Administered 2022-07-01 – 2022-07-02 (×2): 2 g via INTRAVENOUS
  Filled 2022-07-01 (×2): qty 100

## 2022-07-01 MED ORDER — CHLORHEXIDINE GLUCONATE 0.12 % MT SOLN: 15.0000 mL | Freq: Once | OROMUCOSAL | Status: AC

## 2022-07-01 MED ORDER — 0.9 % SODIUM CHLORIDE (POUR BTL) OPTIME
TOPICAL | Status: DC | PRN
  Administered 2022-07-01: 1000 mL

## 2022-07-01 MED ORDER — PHENYLEPHRINE 80 MCG/ML (10ML) SYRINGE FOR IV PUSH (FOR BLOOD PRESSURE SUPPORT)
PREFILLED_SYRINGE | INTRAVENOUS | Status: DC | PRN
  Administered 2022-07-01 (×4): 160 ug via INTRAVENOUS

## 2022-07-01 MED ORDER — EPHEDRINE 5 MG/ML INJ
INTRAVENOUS | Status: AC
  Filled 2022-07-01: qty 5

## 2022-07-01 MED ORDER — MENTHOL 3 MG MT LOZG: 1.0000 | LOZENGE | OROMUCOSAL | Status: DC | PRN

## 2022-07-01 MED ORDER — PHENOL 1.4 % MT LIQD: 1.0000 | OROMUCOSAL | Status: DC | PRN

## 2022-07-01 MED ORDER — TRANEXAMIC ACID-NACL 1000-0.7 MG/100ML-% IV SOLN
1000.0000 mg | INTRAVENOUS | Status: AC
  Administered 2022-07-01: 1000 mg via INTRAVENOUS
  Filled 2022-07-01: qty 100

## 2022-07-01 MED ORDER — PROPOFOL 10 MG/ML IV BOLUS
INTRAVENOUS | Status: DC | PRN
  Administered 2022-07-01: 20 mg via INTRAVENOUS
  Administered 2022-07-01: 90 mg via INTRAVENOUS

## 2022-07-01 MED ORDER — ONDANSETRON HCL 4 MG PO TABS: 4.0000 mg | ORAL_TABLET | Freq: Four times a day (QID) | ORAL | Status: DC | PRN

## 2022-07-01 MED ORDER — CARVEDILOL 3.125 MG PO TABS: 6.2500 mg | ORAL_TABLET | Freq: Once | ORAL | Status: AC

## 2022-07-01 MED ORDER — ROCURONIUM BROMIDE 10 MG/ML (PF) SYRINGE
PREFILLED_SYRINGE | INTRAVENOUS | Status: DC | PRN
  Administered 2022-07-01: 80 mg via INTRAVENOUS
  Administered 2022-07-01: 20 mg via INTRAVENOUS

## 2022-07-01 MED ORDER — MIDAZOLAM HCL 2 MG/2ML IJ SOLN
INTRAMUSCULAR | Status: AC
  Filled 2022-07-01: qty 2

## 2022-07-01 MED ORDER — METHOCARBAMOL 1000 MG/10ML IJ SOLN
1000.0000 mg | Freq: Three times a day (TID) | INTRAVENOUS | Status: DC
  Administered 2022-07-01: 1000 mg via INTRAVENOUS
  Filled 2022-07-01: qty 1000

## 2022-07-01 MED ORDER — LIDOCAINE 2% (20 MG/ML) 5 ML SYRINGE
INTRAMUSCULAR | Status: AC
  Filled 2022-07-01: qty 5

## 2022-07-01 MED ORDER — ACETAMINOPHEN 325 MG PO TABS
325.0000 mg | ORAL_TABLET | Freq: Four times a day (QID) | ORAL | Status: DC | PRN
  Administered 2022-07-02: 650 mg via ORAL
  Filled 2022-07-01 (×2): qty 2

## 2022-07-01 MED ORDER — CARVEDILOL 3.125 MG PO TABS
ORAL_TABLET | ORAL | Status: AC
  Administered 2022-07-01: 6.25 mg via ORAL
  Filled 2022-07-01: qty 2

## 2022-07-01 MED ORDER — MIDAZOLAM HCL 2 MG/2ML IJ SOLN
INTRAMUSCULAR | Status: DC | PRN
  Administered 2022-07-01: 2 mg via INTRAVENOUS

## 2022-07-01 MED ORDER — ACETAMINOPHEN 10 MG/ML IV SOLN
INTRAVENOUS | Status: AC
  Filled 2022-07-01: qty 100

## 2022-07-01 MED ORDER — METHOCARBAMOL 500 MG PO TABS
1000.0000 mg | ORAL_TABLET | Freq: Three times a day (TID) | ORAL | Status: DC
  Administered 2022-07-01 – 2022-07-03 (×5): 1000 mg via ORAL
  Filled 2022-07-01 (×6): qty 2

## 2022-07-01 MED ORDER — ONDANSETRON HCL 4 MG/2ML IJ SOLN
INTRAMUSCULAR | Status: DC | PRN
  Administered 2022-07-01: 4 mg via INTRAVENOUS

## 2022-07-01 MED ORDER — HYDROMORPHONE HCL 1 MG/ML IJ SOLN
1.0000 mg | INTRAMUSCULAR | Status: DC | PRN
  Administered 2022-07-01: 1 mg via INTRAVENOUS
  Filled 2022-07-01: qty 1

## 2022-07-01 MED ORDER — DEXAMETHASONE SODIUM PHOSPHATE 10 MG/ML IJ SOLN
INTRAMUSCULAR | Status: AC
  Filled 2022-07-01: qty 1

## 2022-07-01 MED ORDER — PHENYLEPHRINE HCL-NACL 20-0.9 MG/250ML-% IV SOLN
INTRAVENOUS | Status: DC | PRN
  Administered 2022-07-01: 25 ug/min via INTRAVENOUS

## 2022-07-01 MED ORDER — ORAL CARE MOUTH RINSE: 15.0000 mL | Freq: Once | OROMUCOSAL | Status: AC

## 2022-07-01 MED ORDER — SUGAMMADEX SODIUM 200 MG/2ML IV SOLN
INTRAVENOUS | Status: DC | PRN
  Administered 2022-07-01: 200 mg via INTRAVENOUS

## 2022-07-01 MED ORDER — PHENYLEPHRINE 80 MCG/ML (10ML) SYRINGE FOR IV PUSH (FOR BLOOD PRESSURE SUPPORT)
PREFILLED_SYRINGE | INTRAVENOUS | Status: AC
  Filled 2022-07-01: qty 10

## 2022-07-01 MED ORDER — FENTANYL CITRATE (PF) 250 MCG/5ML IJ SOLN
INTRAMUSCULAR | Status: AC
  Filled 2022-07-01: qty 5

## 2022-07-01 MED ORDER — METOCLOPRAMIDE HCL 5 MG/ML IJ SOLN: 5.0000 mg | Freq: Three times a day (TID) | INTRAMUSCULAR | Status: DC | PRN

## 2022-07-01 MED ORDER — ACETAMINOPHEN 10 MG/ML IV SOLN
INTRAVENOUS | Status: DC | PRN
  Administered 2022-07-01: 1000 mg via INTRAVENOUS

## 2022-07-01 MED ORDER — LIDOCAINE 2% (20 MG/ML) 5 ML SYRINGE
INTRAMUSCULAR | Status: DC | PRN
  Administered 2022-07-01: 40 mg via INTRAVENOUS

## 2022-07-01 MED ORDER — DOCUSATE SODIUM 100 MG PO CAPS
100.0000 mg | ORAL_CAPSULE | Freq: Two times a day (BID) | ORAL | Status: DC
  Administered 2022-07-01 – 2022-07-03 (×4): 100 mg via ORAL
  Filled 2022-07-01 (×4): qty 1

## 2022-07-01 MED ORDER — LACTATED RINGERS IV SOLN: INTRAVENOUS | Status: DC

## 2022-07-01 MED ORDER — ROCURONIUM BROMIDE 10 MG/ML (PF) SYRINGE
PREFILLED_SYRINGE | INTRAVENOUS | Status: AC
  Filled 2022-07-01: qty 10

## 2022-07-01 MED ORDER — DEXAMETHASONE SODIUM PHOSPHATE 10 MG/ML IJ SOLN
INTRAMUSCULAR | Status: DC | PRN
  Administered 2022-07-01: 8 mg via INTRAVENOUS

## 2022-07-01 MED ORDER — PROPOFOL 10 MG/ML IV BOLUS
INTRAVENOUS | Status: AC
  Filled 2022-07-01: qty 20

## 2022-07-01 MED ORDER — HYDROMORPHONE HCL 1 MG/ML IJ SOLN
1.0000 mg | INTRAMUSCULAR | Status: DC | PRN
  Administered 2022-07-02: 1 mg via INTRAVENOUS
  Filled 2022-07-01: qty 1

## 2022-07-01 MED ORDER — OXYCODONE HCL 5 MG PO TABS
5.0000 mg | ORAL_TABLET | ORAL | Status: DC | PRN
  Administered 2022-07-01 – 2022-07-03 (×6): 10 mg via ORAL
  Filled 2022-07-01 (×6): qty 2

## 2022-07-01 MED ORDER — METOCLOPRAMIDE HCL 5 MG PO TABS: 5.0000 mg | ORAL_TABLET | Freq: Three times a day (TID) | ORAL | Status: DC | PRN

## 2022-07-01 SURGICAL SUPPLY — 58 items
BAG COUNTER SPONGE SURGICOUNT (BAG) ×1 IMPLANT
BAG SPNG CNTER NS LX DISP (BAG) ×1
BIT DRILL CANN FLEX 14 (BIT) IMPLANT
BIT DRILL LONG 4.2 (BIT) IMPLANT
BIT DRILL SHORT 4.2 (BIT) IMPLANT
BNDG CMPR 5X6 CHSV STRCH STRL (GAUZE/BANDAGES/DRESSINGS) ×2
BNDG COHESIVE 6X5 TAN ST LF (GAUZE/BANDAGES/DRESSINGS) IMPLANT
BRUSH SCRUB EZ PLAIN DRY (MISCELLANEOUS) ×2 IMPLANT
COVER PERINEAL POST (MISCELLANEOUS) ×1 IMPLANT
COVER SURGICAL LIGHT HANDLE (MISCELLANEOUS) ×2 IMPLANT
DRAPE C-ARM 42X72 X-RAY (DRAPES) IMPLANT
DRAPE C-ARMOR (DRAPES) ×1 IMPLANT
DRAPE HALF SHEET 40X57 (DRAPES) IMPLANT
DRAPE INCISE IOBAN 66X45 STRL (DRAPES) IMPLANT
DRAPE ORTHO SPLIT 77X108 STRL (DRAPES) ×2
DRAPE SURG ORHT 6 SPLT 77X108 (DRAPES) ×2 IMPLANT
DRAPE U-SHAPE 47X51 STRL (DRAPES) ×1 IMPLANT
DRESSING MEPILEX FLEX 4X4 (GAUZE/BANDAGES/DRESSINGS) ×1 IMPLANT
DRILL BIT SHORT 4.2 (BIT) ×2
DRSG MEPILEX FLEX 4X4 (GAUZE/BANDAGES/DRESSINGS) ×3
DRSG MEPILEX POST OP 4X8 (GAUZE/BANDAGES/DRESSINGS) ×1 IMPLANT
DRSG MEPITEL 4X7.2 (GAUZE/BANDAGES/DRESSINGS) IMPLANT
ELECT REM PT RETURN 9FT ADLT (ELECTROSURGICAL) ×1
ELECTRODE REM PT RTRN 9FT ADLT (ELECTROSURGICAL) ×1 IMPLANT
GAUZE SPONGE 4X4 12PLY STRL (GAUZE/BANDAGES/DRESSINGS) IMPLANT
GLOVE BIO SURGEON STRL SZ7.5 (GLOVE) ×1 IMPLANT
GLOVE BIO SURGEON STRL SZ8 (GLOVE) ×1 IMPLANT
GLOVE BIOGEL PI IND STRL 7.5 (GLOVE) ×1 IMPLANT
GLOVE BIOGEL PI IND STRL 8 (GLOVE) ×1 IMPLANT
GLOVE SURG ORTHO LTX SZ7.5 (GLOVE) ×2 IMPLANT
GOWN STRL REUS W/ TWL LRG LVL3 (GOWN DISPOSABLE) ×2 IMPLANT
GOWN STRL REUS W/ TWL XL LVL3 (GOWN DISPOSABLE) ×1 IMPLANT
GOWN STRL REUS W/TWL LRG LVL3 (GOWN DISPOSABLE) ×2
GOWN STRL REUS W/TWL XL LVL3 (GOWN DISPOSABLE) ×1
GUIDEWIRE 3.2X400 (WIRE) IMPLANT
KIT BASIN OR (CUSTOM PROCEDURE TRAY) ×1 IMPLANT
KIT TURNOVER KIT B (KITS) ×1 IMPLANT
MANIFOLD NEPTUNE II (INSTRUMENTS) ×1 IMPLANT
NAIL CANN FRN TI 11X400 (Nail) IMPLANT
NS IRRIG 1000ML POUR BTL (IV SOLUTION) ×1 IMPLANT
PACK GENERAL/GYN (CUSTOM PROCEDURE TRAY) ×1 IMPLANT
PAD ARMBOARD 7.5X6 YLW CONV (MISCELLANEOUS) ×2 IMPLANT
REAMER ROD DEEP FLUTE 2.5X950 (INSTRUMENTS) IMPLANT
SCREW LOCK IM 5X44 (Screw) IMPLANT
SCREW LOCK IM TI 5X40 STRL (Screw) IMPLANT
STAPLER VISISTAT 35W (STAPLE) ×1 IMPLANT
STOCKINETTE IMPERVIOUS LG (DRAPES) IMPLANT
SUT ETHILON 2 0 FS 18 (SUTURE) ×1 IMPLANT
SUT VIC AB 0 CT1 27 (SUTURE) ×1
SUT VIC AB 0 CT1 27XBRD ANBCTR (SUTURE) ×1 IMPLANT
SUT VIC AB 1 CT1 27 (SUTURE) ×1
SUT VIC AB 1 CT1 27XBRD ANBCTR (SUTURE) ×1 IMPLANT
SUT VIC AB 2-0 CT1 27 (SUTURE) ×2
SUT VIC AB 2-0 CT1 TAPERPNT 27 (SUTURE) ×1 IMPLANT
TAPE CLOTH SURG 4X10 WHT LF (GAUZE/BANDAGES/DRESSINGS) IMPLANT
TOWEL GREEN STERILE (TOWEL DISPOSABLE) ×2 IMPLANT
TOWEL GREEN STERILE FF (TOWEL DISPOSABLE) ×1 IMPLANT
WATER STERILE IRR 1000ML POUR (IV SOLUTION) ×1 IMPLANT

## 2022-07-01 NOTE — Consult Note (Addendum)
Cardiology Consultation   Patient ID: Tyler Hendrix MRN: [id number]; DOB: [birth year]  Admit date: 06/30/2022 Date of Consult: 07/01/2022  PCP:  [name], MD   [facility] Providers Cardiologist:  Chrystie Nose, MD  Electrophysiologist:  Hillis Range, MD (Inactive)  Dr Ladona Ridgel     Patient Profile:   Tyler Hendrix is a 67 y.o. male with a complex hx of chemotherapy induced cardiomyopathy, chronic systolic heart failure with reduced EF, complete heart block s/p Medtronic PPM 2017 and biventricular CRT-P upgrade 2020,  CAD s/p stent to diagonal and circumflex 2017 and CABG x2 (LIMA to LAD, SVG to RCA ) on 01/2018, mitral regurgitation s/p bioprosthetic mitral valve replacement on 01/2018, remote Hodgkin's lymphoma s/p chemoradiation 2002, paroxysmal atrial fibrillation/flutter s/p cardioversion 2020 and ablation 06/2021, hypertension, dyslipidemia, hypothyroidism,  who is being seen 07/01/2022 for the evaluation of preop evaluation at the request of Dr Dr Tyson Babinski.  History of Present Illness:   Mr. Brincefield with above PMH presented to ER on 07/01/22 after a fall from 6 feet height. He was cutting a tree, possibly got hit by the tree branch or the ladder, resulting the fall.He developed immediate left leg pain. He was sent here and seen by trauma and ortho service, found to have acute closed left femoral shaft fracture, placed in Buck's traction, planned for antegrade intramedullary nailing. Cardiology is consulted for pre-op evaluation today.   Per chart review, he had a history of non-Hodgkin lymphoma requiring chemoradiation back in 2002 and is currently in remission. Patient follows Dr. Rennis Golden and Dr. Ladona Ridgel for cardiology.  He has longstanding history of chemotherapy-induced/ischemic cardiomyopathy and chronic systolic heart failure. R/L heart cath 11/2017 showed patent diag and Cx stents, 90% second diagonal, 50% mid RCA, 80% mid LAD, LVEF 45 to 50%, LVEDP normal, no aortic  stenosis, mild mitral regurgitation. Ao sat 90%, PA sat 70%, mean PA 37/15 mm Hg, mean PA 21 mm Hg; mean PCWP 13 mm Hg; CO 6.9 L/min; CI 3.35; no prominent V waves on wedge tracing.   In 01/2018, he underwent bioprosthetic mitral valve replacement for mitral regurgitation as well as CABG x 2 with LIMA to LAD, SVG to RCA for CAD. He was on coumadin for a while and now remains on ASA 81mg . He has been doing well since his CABG, had no ischemic evaluation since.  Most recent Echo was completed 11/01/2021 with LVEF 30 to 35%, no wall hypokinesis, moderate LVH, unable to assess diastolic function, low normal RV, moderate LAE, mild RAE, mitral valve status post replacement with trivial MR mean gradient 4 mmHg, aortic sclerosis, overall no significant change from 09/2020 Echo. He is maintained on ASA 81mg , lipitor 40 mg, carvedilol 6.25 mg twice daily and Entresto 24 to 26 mg twice daily, spironolactone 12.5 mg daily for GDMT.   He had hx of complete heart block and required Medtronic PPM in 9/ 2017.  He underwent biventricular CRT-P upgrade on 02/08/2019 by Dr. Johney Frame.  He also had a paroxysmal A-fib/flutter required cardioversion in 2020 and a flutter ablation 06/2021. He was last seen by Dr. Ladona Ridgel 06/19/2022, reportedly having complaints from diaphragmatic stimulation as well as pacing induced left bundle branch block.  Pacemaker was reprogrammed with LV to 1@0 .8 position and DDDR.  He was ultimately scheduled for lead extraction and replacement of ALB area lead and a CS lead in June 2024, in order to improve biventricular pacing.  Last device check was 06/19/2022 revealing normal device function, device  heart failure diagnostics were within normal limits and stable, estimated longevity 5.8 years.   Upon encounter, patient states he has been doing well, has chronic shortness of breath with exertion, felt heart failure symptoms had been stable over the past 6 months to a year.  He is able to tolerate dumbbell weight  lifting exercise and work at baseline. He denied any resting SOB, rapid weight gain, leg edema,  chest pain, syncope. He had severe ear infection with bleeding a years ago that cause him to continue having some intermittent vertigo. He is complaint with his medication, not taking or needing any lasix at baseline. He believes he was hit by the tree branches which caused his fall.  He is able to walk up to 2 flights of stairs if needed and remains active in life.   Admission diagnostic today revealed hemoglobin 12.9, otherwise grossly unremarkable BMP and CBC.  INR 1.1.  MRSA nasal swab positive for Staph aureus.  Chest x-ray revealed no acute abnormality.  \    Past Medical History:  Diagnosis Date   Abnormal TSH    Arthritis    ddd   Atrial fibrillation and flutter (HCC)    s/p DCCV 03/26/2018   CAD in native artery    a. 11/2015: s/p 2 vessel PCI of the first diagonal branch of the LAD in the mid left circumflex with DES. // s/p CABG 01/2018   Chronic combined systolic and diastolic CHF (congestive heart failure) (HCC)    sees Dr. Johney Frame    Complete heart block (HCC)    a. s/p Medtronic ppm 10/2015.   History of radiation therapy 2002   Hodgkin disease (HCC) 2002   a. s/p chest radiation and chemotherapy   Hypothyroidism    sees Dr. 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/27/2018   29 mm [facility] Mitral stented bovine pericardial tissue valve    Past Surgical History:  Procedure Laterality Date   A-FLUTTER ABLATION N/A 06/20/2021   Procedure: A-FLUTTER ABLATION;  Surgeon: Marinus Maw, MD;  Location: [facility] INVASIVE CV  LAB;  Service: Cardiovascular;  Laterality: N/A;   BIV UPGRADE N/A 02/08/2019   Procedure: UPGRADE TO BIV PPM;  Surgeon: Hillis Range, MD;  Location: [facility] INVASIVE CV LAB;  Service: Cardiovascular;  Laterality: N/A;   CARDIAC CATHETERIZATION N/A 11/02/2015   Procedure: Left Heart Cath and Coronary Angiography;  Surgeon: Tonny Bollman, MD;  Location: [facility] INVASIVE CV LAB;  Service: Cardiovascular;  Laterality: N/A;   CARDIAC CATHETERIZATION N/A 11/21/2015   Procedure: Coronary Stent Intervention;  Surgeon: Tonny Bollman, MD;  Location: [facility] INVASIVE CV LAB;  Service: Cardiovascular;  Laterality: N/A;   CARDIAC CATHETERIZATION  11/2017   CARDIOVERSION N/A 03/26/2018   Procedure: CARDIOVERSION;  Surgeon: Parke Poisson, MD;  Location: [facility] ENDOSCOPY;  Service: Cardiovascular;  Laterality: N/A;   COLONOSCOPY  01/16/2015   per Dr. Christella Hartigan, benign polyps, repeat in 10 yrs    CORONARY ARTERY BYPASS GRAFT N/A 01/27/2018   Procedure: CORONARY ARTERY BYPASS GRAFTING (CABG) x two, using left internal mammary artery and right leg greater  saphenous vein harvested endoscopically;  Surgeon: Purcell Nails, MD;  Location: [facility] OR;  Service: Open Heart Surgery;  Laterality: N/A;   EP IMPLANTABLE DEVICE N/A 11/02/2015   MDT Adivisa MRI conditional dual chamber pacemaker implanted by Dr Johney Frame for complete heart block   KNEE SURGERY Right 1972   LUMBAR LAMINECTOMY  1996   MITRAL VALVE REPLACEMENT N/A 01/27/2018   Procedure: MITRAL VALVE (MV) REPLACEMENT using Magna Mitral Ease Valve Size ;  Surgeon: Purcell Nails, MD;  Location: [facility] OR;  Service: Open Heart Surgery;  Laterality: N/A;   RIGHT/LEFT HEART CATH AND CORONARY ANGIOGRAPHY N/A 12/02/2017   Procedure: RIGHT/LEFT HEART CATH AND CORONARY ANGIOGRAPHY;  Surgeon: Corky Crafts, MD;  Location: [facility] INVASIVE CV LAB;  Service: Cardiovascular;  Laterality: N/A;   TEE WITHOUT CARDIOVERSION N/A 12/09/2017   Procedure: TRANSESOPHAGEAL ECHOCARDIOGRAM (TEE);   Surgeon: Jodelle Red, MD;  Location: [facility] ENDOSCOPY;  Service: Cardiovascular;  Laterality: N/A;   TEE WITHOUT CARDIOVERSION N/A 01/27/2018   Procedure: TRANSESOPHAGEAL ECHOCARDIOGRAM (TEE);  Surgeon: Purcell Nails, MD;  Location: [facility] OR;  Service: Open Heart Surgery;  Laterality: N/A;     Home Medications:  Prior to Admission medications   Medication Sig Start Date End Date Taking? Authorizing Provider  aspirin EC 81 MG tablet Take 81 mg by mouth daily. 11/16/18  Yes [provider]  atorvastatin (LIPITOR) 40 MG tablet Take 1 tablet by mouth daily 07/05/21  Yes [name], MD  carvedilol (COREG) 6.25 MG tablet Take 1 tablet (6.25 mg total) by mouth 2 (two) times daily with a meal. 07/05/21  Yes [name], MD  levothyroxine (SYNTHROID) 75 MCG tablet TAKE 1 TABLET BY MOUTH DAILY BEFORE BREAKFAST. 05/24/21 07/27/22 Yes Carlus Pavlov, MD  Multiple Vitamin (MULTIVITAMIN WITH MINERALS) TABS tablet Take 1 tablet by mouth daily.   Yes [provider]  sacubitril-valsartan (ENTRESTO) 24-26 MG Take 1 tablet by mouth 2 (two) times daily. 02/07/22 02/07/23 Yes Chrystie Nose, MD  spironolactone (ALDACTONE) 25 MG tablet TAKE 1/2 TABLET BY MOUTH ONCE A DAY 07/05/21 07/27/22 Yes [name], MD  traZODone (DESYREL) 50 MG tablet Take 1 tablet (50 mg total) by mouth at bedtime. Patient taking differently: Take 100 mg by mouth at bedtime. 07/05/21  Yes [name], MD  sildenafil (VIAGRA) 50 MG tablet Take 1 tablet (50 mg total) by mouth daily as needed for erectile dysfunction. 05/24/20   [name], MD    Inpatient Medications: Scheduled Meds:  [MAR Hold] bacitracin   Topical BID   [MAR Hold] mupirocin ointment  1 Application Nasal BID   Continuous Infusions:   ceFAZolin (ANCEF) IV     lactated ringers 10 mL/hr at 07/01/22 1306   tranexamic acid     PRN Meds: [MAR Hold] acetaminophen **OR** [MAR Hold] acetaminophen, [MAR Hold] bisacodyl, [MAR Hold]   HYDROmorphone (DILAUDID) injection, [MAR Hold] ondansetron **OR** [MAR Hold] ondansetron (ZOFRAN) IV  Allergies:    Allergies  Allergen Reactions   Other     Pt reported allergic to dust mites that causes of sneezing, runny nose    Social History:   Social History   Socioeconomic History   Marital status: Married    Spouse name: Not on file   Number of children: 2   Years of education: Masters   Highest education level: Not on file  Occupational History   Occupation: lawyer    Comment: [employer]  Tobacco Use  Smoking status: Never   Smokeless tobacco: Never  Vaping Use   Vaping Use: Never used  Substance and Sexual Activity   Alcohol use: Yes    Alcohol/week: 5.0 standard drinks of alcohol    Types: 5 Glasses of wine per week   Drug use: No   Sexual activity: Not on file  Other Topics Concern   Not on file  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[place]   Social Determinants of Health   Financial Resource Strain: Not on file  Food Insecurity: No Food Insecurity (06/30/2022)   Hunger Vital Sign    Worried About Running Out of Food in the Last Year: Never true    Ran Out of Food in the Last Year: Never true  Transportation Needs: No Transportation Needs (06/30/2022)   PRAPARE - Administrator, Civil Service (Medical): No    Lack of Transportation (Non-Medical): No  Physical Activity: Not on file  Stress: Not on file  Social Connections: Not on file  Intimate Partner Violence: Not At Risk (06/30/2022)   Humiliation, Afraid, Rape, and Kick questionnaire    Fear of Current or Ex-Partner: No    Emotionally Abused: No    Physically Abused: No    Sexually Abused: No    Family History:    Family History  Problem Relation Age of Onset   Cancer Other        prostate father hx   Heart disease Mother    Heart disease Father    Colon cancer Neg Hx      ROS:  Constitutional: Denied fever, chills, malaise, night  sweats Eyes: Denied vision change or loss Ears/Nose/Mouth/Throat: Denied ear ache, sore throat, coughing, sinus pain Cardiovascular: see HPI  Respiratory: see HPI  Gastrointestinal: Denied nausea, vomiting, abdominal pain, diarrhea Genital/Urinary: Denied dysuria, hematuria, urinary frequency/urgency Musculoskeletal: see HPI  Skin: Denied rash, wound Neuro: vertigo  Psych: Denied history of depression/anxiety  Endocrine: Denied history of diabetes   Physical Exam/Data:   Vitals:   07/01/22 0438 07/01/22 0818 07/01/22 1230 07/01/22 1304  BP: 116/62 122/70  (!) 148/60  Pulse: 61 64    Resp: 18 16  17   Temp: 98.5 F (36.9 C) 98.2 F (36.8 C)  98.5 F (36.9 C)  TempSrc: Oral Oral  Oral  SpO2: 95% 94% (!) 87% 95%  Weight:      Height:        Intake/Output Summary (Last 24 hours) at 07/01/2022 1403 Last data filed at 07/01/2022 0346 Gross per 24 hour  Intake --  Output 600 ml  Net -600 ml      06/30/2022    6:30 PM 06/19/2022    9:35 AM 03/19/2022   11:07 AM  Last 3 Weights  Weight (lbs) 191 lb 196 lb 3.2 oz 196 lb 3.2 oz  Weight (kg) 86.637 kg 88.996 kg 88.996 kg     Body mass index is 26.64 kg/m.   Vitals:  Vitals:   07/01/22 1230 07/01/22 1304  BP:  (!) 148/60  Pulse:    Resp:  17  Temp:  98.5 F (36.9 C)  SpO2: (!) 87% 95%   General Appearance: In no apparent distress, laying in bed HEENT: Normocephalic, atraumatic.  Neck: Supple, trachea midline, no JVD Cardiovascular: Regular rate and rhythm, normal S1-S2,  no murmur Respiratory: Resting breathing unlabored, lungs sounds clear to auscultation bilaterally, no use of accessory muscles. On room air.  No wheezes, rales or rhonchi.  Gastrointestinal: Bowel sounds positive, abdomen soft Extremities: LLE in traction/not removed for exam; RLE pedal pulse 2+, no edema  Musculoskeletal: Normal muscle bulk and tone Skin: Intact, warm, dry. No rashes or petechiae noted in exposed areas.  Neurologic: Alert, oriented  to person, place and time. Fluent speech, no facial droop, no cognitive deficit, no gross focal neuro deficit Psychiatric: Normal affect. Mood is appropriate.    EKG:  The EKG was personally reviewed and demonstrates:  N/A this admission Telemetry:  Telemetry was personally reviewed and demonstrates:  paced rhythm   Relevant CV Studies:   Pacemaker device check 06/19/2022:  CRT-P device check in clinic. Normal device function. Thresholds, sensing, impedance consistent with previous measurements. Histograms appropriate for patient and level of activity. No mode switches or ventricular high rate episodes. LV lead programmed  off due to diaphrag. stim.  Not BiV pacing at present.  In today for discussion around upgrade and repositioning of the LV lead for better contact.   Device programmed with appropriate safety margins. Device heart failure diagnostics are within normal limits and stable over time. Estimated longevity 5.8 years. Patient enrolled in remote follow-up. Plan to check device remotely in 3 months and every 6  months in office.   PROGRAMMING CHANGES TODAY:  CRT-P programming adjusted to re-initiate LV-RV CRT pacing support.  1.  LV programmed to 1.0@0 .8 (LV 1- 2) position, did not produce stim.  2.  Changed programming to DDDR  See EPIC encounter for discussion around device upgrade plans.   Echocardiogram 11/01/2021:   1. Left ventricular ejection fraction, by estimation, is 30 to 35%. The  left ventricle has moderately decreased function. The left ventricle  demonstrates global hypokinesis. The left ventricular internal cavity size  was mildly dilated. There is moderate   left ventricular hypertrophy. Left ventricular diastolic function could  not be evaluated.   2. Right ventricular systolic function is low normal. The right  ventricular size is mildly enlarged.   3. Left atrial size was moderately dilated.   4. Right atrial size was mildly dilated.   5. The mitral  valve has been repaired/replaced. Trivial mitral valve  regurgitation. The mean mitral valve gradient is 4.0 mmHg. There is a 29  mm Edwards Magna Mitral Bovine present in the mitral position. Procedure  Date: 01/27/18.   6. The aortic valve is tricuspid. There is mild calcification of the  aortic valve. Aortic valve regurgitation is not visualized. Aortic valve  sclerosis is present, with no evidence of aortic valve stenosis.   7. The inferior vena cava is normal in size with greater than 50%  respiratory variability, suggesting right atrial pressure of 3 mmHg.   Comparison(s): No significant change from prior study. 09/18/20 EF 30-35%.   Laboratory Data:  High Sensitivity Troponin:  No results for input(s): "TROPONINIHS" in the last 720 hours.   Chemistry Recent Labs  Lab 06/30/22 1845  NA 136  K 4.3  CL 104  CO2 24  GLUCOSE 108*  BUN 18  CREATININE 1.19  CALCIUM 9.0  GFRNONAA >60  ANIONGAP 8    No results for input(s): "PROT", "ALBUMIN", "AST", "ALT", "ALKPHOS", "BILITOT" in the last 168 hours. Lipids No results for input(s): "CHOL", "TRIG", "HDL", "LABVLDL", "LDLCALC", "CHOLHDL" in the last 168 hours.  Hematology Recent Labs  Lab 06/30/22 1845  WBC 8.8  RBC 4.03*  HGB 12.9*  HCT 38.4*  MCV 95.3  MCH 32.0  MCHC 33.6  RDW 13.1  PLT 312   Thyroid No  results for input(s): "TSH", "FREET4" in the last 168 hours.  BNPNo results for input(s): "BNP", "PROBNP" in the last 168 hours.  DDimer No results for input(s): "DDIMER" in the last 168 hours.   Radiology/Studies:  DG Knee Left Port  Result Date: 06/30/2022 CLINICAL DATA:  308657 Closed fracture of left distal femur (HCC) 544450 EXAM: PORTABLE LEFT KNEE - 1-2 VIEW COMPARISON:  None Available. FINDINGS: No evidence of fracture, dislocation, or joint effusion. No evidence of arthropathy or other focal bone abnormality. Soft tissues are unremarkable. IMPRESSION: Negative. Electronically Signed   By: Helyn Numbers M.D.    On: 06/30/2022 21:33   DG Femur Portable Min 2 Views Left  Result Date: 06/30/2022 CLINICAL DATA:  Fall off ladder. EXAM: LEFT FEMUR PORTABLE 2 VIEWS COMPARISON:  None Available. FINDINGS: Severely displaced and angulated fracture is seen involving the midportion of the left femoral shaft. Overlapping fracture fragments are noted. IMPRESSION: Severely displaced and angulated left femoral midshaft fracture is noted. Electronically Signed   By: Lupita Raider M.D.   On: 06/30/2022 19:14   DG Pelvis Portable  Result Date: 06/30/2022 CLINICAL DATA:  Fall off ladder. EXAM: PORTABLE PELVIS 1-2 VIEWS COMPARISON:  None Available. FINDINGS: There is no evidence of pelvic fracture or diastasis. No pelvic bone lesions are seen. IMPRESSION: Negative. Electronically Signed   By: Lupita Raider M.D.   On: 06/30/2022 19:13   DG Chest Portable 1 View  Result Date: 06/30/2022 CLINICAL DATA:  Fall off ladder. EXAM: PORTABLE CHEST 1 VIEW COMPARISON:  February 08, 2019. FINDINGS: Mild cardiomegaly is noted. Status post coronary artery bypass graft and mitral valve repair. Left-sided pacemaker is in grossly good position. Lungs are clear. Bony thorax is unremarkable. IMPRESSION: No acute abnormality seen. Electronically Signed   By: Lupita Raider M.D.   On: 06/30/2022 19:11     Assessment and Plan:   Preop risk evaluation - RCRI is 2, 6.6% perioperative risk of major cardiac event - DASI score 58.2, 9.89  METS - Will check pre-op EKG today  - He has complex past cardiac history with LVEF 30%, he is at least moderate risk for peri-operative decompensation, he is stable from CHF standpoint and no absolute contraindication for planned surgery at this time   Chronic systolic heart failure - Echo from 11/01/21 showed LVEF 30-35%, nieces, moderate LVH, low normal RV, moderate LAE, mild RAE, status post mitral valve replacement with mean gradient 4 mmHg, aortic sclerosis. - CHF symptoms has been stable over the past  6-month; clinically euvolemic  - Chest x-ray and labs without any acute finding, POA  - GDMT: Continue PTA regimen including carvedilol 6.25 mg twice daily, Entresto 24-26 twice daily, spironolactone 12.5mg  daily, he did not want SGLT2I in the past/may re-visit this topic in the future  - follow up postoperatively, close monitor for CHF decompensation  History of complete heart block Cardiomyopathy - s/p  Medtronic PPM 2017 and CRT-P upgrade 01/2019, is now planned for lead extraction and replacement per Dr. Ladona Ridgel for improving biventricular pacing in June 2024   CAD with history of PCI and CABG 2019 -No angina with good functioning level -Continue aspirin 81 mg (if OK per surgery), Lipitor 40 mg daily, and carvedilol 6.25 mg twice daily  Mitral regurgitation s/p bioprosthetic mitral valve replacement 2019 - Echo from 10/2021 stable   Paroxysmal atrial flutter/fibrillation s/p cardioversion and ablation 06/20/21 - currently paced rhythm on telemetry, AV dual paced on 06/19/22, will check EKG today  -  No longer on anticoagulation PTA - would monitor on telemetry post-operatively      Risk Assessment/Risk Scores:   New York Heart Association (NYHA) Functional Class NYHA Class II  CHA2DS2-VASc Score = 4  his indicates a 4.8% annual risk of stroke. The patient's score is based upon: CHF History: 1 HTN History: 1 Diabetes History: 0 Stroke History: 0 Vascular Disease History: 1 Age Score: 1 Gender Score: 0        For questions or updates, please contact [facility] Please consult [email or web address] for contact info under    Signed, Cyndi Bender, NP  07/01/2022 2:03 PM  Agree with note by Lethea Killings NP  We are asked to see this 67 year old Caucasian male for preoperative cardiovascular clearance before hip pinning.  The patient of Dr. Blanchie Dessert.  His history is notable for CABG 12/19, mitral valve replacement with bioprosthetic mitral valve at the same time.  He had A-fib  ablation.  He has had a CRT device placed and is followed by Dr. Ladona Ridgel who is planning on repositioning his ventricular lead on June 10.  He is very active.  He does have known LV dysfunction with an EF in the 30% range with chronic shortness of breath which has not changed in severity.  Apparently he was up in a tree cutting limbs, fell and broke his hip.  He is very active and works out in [employer]  He denies chest pain.  His EKG in the past that showed AV sequential pacing.  His exam is benign.  He has not mild to moderately elevated risk for surgery because of his LV dysfunction but acceptable.  I discussed this with Dr. Dr. Carola Frost, the orthopedic surgeon.  Per Dr. Carola Frost he will need anticoagulation postop.  It is not clear whether or not this will affect the timing of his lead repositioning.  Runell Gess, M.D., FACP, [facility], [place] [facility] [address] [place], [place]  [address]  [phone] 07/01/2022 2:35 PM

## 2022-07-01 NOTE — Anesthesia Preprocedure Evaluation (Addendum)
Anesthesia Evaluation  Patient identified by MRN, date of birth, ID band Patient awake    Reviewed: Allergy & Precautions, H&P , NPO status , Patient's Chart, lab work & pertinent test results  History of Anesthesia Complications Negative for: history of anesthetic complications  Airway Mallampati: III  TM Distance: >3 FB     Dental  (+) Dental Advisory Given   Pulmonary neg pulmonary ROS   breath sounds clear to auscultation       Cardiovascular hypertension, Pt. on medications (-) angina + CAD, + CABG (CABG x 2 2019) and +CHF (LVEF 30-35%)  + dysrhythmias (CHB s/p ppm 2017) Atrial Fibrillation + pacemaker + Valvular Problems/Murmurs (s/p MVR 2019)  Rhythm:Regular  2023 1. Left ventricular ejection fraction, by estimation, is 30 to 35%. The  left ventricle has moderately decreased function. The left ventricle  demonstrates global hypokinesis. The left ventricular internal cavity size  was mildly dilated. There is moderate   left ventricular hypertrophy. Left ventricular diastolic function could  not be evaluated.   2. Right ventricular systolic function is low normal. The right  ventricular size is mildly enlarged.   3. Left atrial size was moderately dilated.   4. Right atrial size was mildly dilated.   5. The mitral valve has been repaired/replaced. Trivial mitral valve  regurgitation. The mean mitral valve gradient is 4.0 mmHg. There is a 29  mm Edwards Magna Mitral Bovine present in the mitral position. Procedure  Date: 01/27/18.   6. The aortic valve is tricuspid. There is mild calcification of the  aortic valve. Aortic valve regurgitation is not visualized. Aortic valve  sclerosis is present, with no evidence of aortic valve stenosis.   7. The inferior vena cava is normal in size with greater than 50%  respiratory variability, suggesting right atrial pressure of 3 mmHg.      Neuro/Psych negative neurological ROS   negative psych ROS   GI/Hepatic negative GI ROS, Neg liver ROS,,,  Endo/Other  Hypothyroidism    Renal/GU negative Renal ROSLab Results      Component                Value               Date                      CREATININE               1.19                06/30/2022             negative genitourinary   Musculoskeletal  (+) Arthritis , Osteoarthritis,    Abdominal   Peds  Hematology negative hematology ROS (+) Hb 12.9, plt 312   Anesthesia Other Findings   Reproductive/Obstetrics                             Anesthesia Physical Anesthesia Plan  ASA: 3  Anesthesia Plan:    Post-op Pain Management: Ofirmev IV (intra-op)*   Induction: Intravenous  PONV Risk Score and Plan: 2 and Ondansetron and Dexamethasone  Airway Management Planned: Oral ETT  Additional Equipment: None  Intra-op Plan:   Post-operative Plan: Extubation in OR  Informed Consent: I have reviewed the patients History and Physical, chart, labs and discussed the procedure including the risks, benefits and alternatives for the proposed anesthesia with the patient or authorized  representative who has indicated his/her understanding and acceptance.     Dental advisory given  Plan Discussed with: CRNA  Anesthesia Plan Comments:        Anesthesia Quick Evaluation

## 2022-07-01 NOTE — Anesthesia Procedure Notes (Signed)
Procedure Name: Intubation Date/Time: 07/01/2022 2:37 PM  Performed by: De Nurse, CRNAPre-anesthesia Checklist: Patient identified, Emergency Drugs available, Suction available and Patient being monitored Patient Re-evaluated:Patient Re-evaluated prior to induction Oxygen Delivery Method: Circle System Utilized Preoxygenation: Pre-oxygenation with 100% oxygen Induction Type: IV induction and Cricoid Pressure applied Ventilation: Mask ventilation without difficulty Laryngoscope Size: Mac and 4 Grade View: Grade III Tube type: Oral Tube size: 7.5 mm Number of attempts: 1 Airway Equipment and Method: Stylet and Oral airway Placement Confirmation: ETT inserted through vocal cords under direct vision, positive ETCO2 and breath sounds checked- equal and bilateral Secured at: 22 cm Tube secured with: Tape Dental Injury: Teeth and Oropharynx as per pre-operative assessment  Difficulty Due To: Difficulty was anticipated and Difficult Airway- due to anterior larynx

## 2022-07-01 NOTE — H&P (Signed)
History and Physical    Patient: Tyler Hendrix PPI:[id number] DOB: 1955/08/20 DOA: 06/30/2022 DOS: the patient was seen and examined on 07/01/2022 PCP: [name], MD  Patient coming from: Home  Chief Complaint:  Chief Complaint  Patient presents with   Trauma   Fall   HPI: Tyler Hendrix is a 67 y.o. 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 He was out cutting a branch from a fallen tree with a chainsaw when the main tree trunk rotated.  He jumped backwards off the ladder but the tree trunk crashed the ladder into the side of his leg.  He immediately knew it was broken.  He immediately called his wife and 911 was called.  Emergency department the patient had an x-ray which revealed the left femur fracture.  Orthopedics was consulted and will be taking the patient to the OR. At baseline the patient does not have chest pain.  He goes to the gym frequently. He is able to walk up and down a flight of stairs continues to work.   Review of Systems: As mentioned in the history of present illness. All other systems reviewed and are negative. Past Medical History:  Diagnosis Date   Abnormal TSH    Arthritis    ddd   Atrial fibrillation and flutter (HCC)    s/p DCCV 03/26/2018   CAD in native artery    a. 11/2015: s/p 2 vessel PCI of the first diagonal branch of the LAD in the mid left circumflex with DES. // s/p CABG 01/2018   Chronic combined systolic and diastolic CHF (congestive heart failure) (HCC)    sees Dr. Johney Frame    Complete heart block (HCC)    a. s/p Medtronic ppm 10/2015.   History of radiation therapy 2002   Hodgkin disease (HCC) 2002   a. s/p chest radiation and chemotherapy    Hypothyroidism    sees Dr. 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/27/2018   29 mm [facility] Mitral stented bovine pericardial tissue valve   Past Surgical History:  Procedure Laterality Date   A-FLUTTER ABLATION N/A 06/20/2021   Procedure: A-FLUTTER ABLATION;  Surgeon: Marinus Maw, MD;  Location: [facility] INVASIVE CV LAB;  Service: Cardiovascular;  Laterality: N/A;   BIV UPGRADE N/A 02/08/2019   Procedure: UPGRADE TO BIV PPM;  Surgeon: Hillis Range, MD;  Location: [facility] INVASIVE CV LAB;  Service: Cardiovascular;  Laterality: N/A;   CARDIAC CATHETERIZATION N/A 11/02/2015   Procedure: Left Heart Cath and Coronary Angiography;  Surgeon: Tonny Bollman, MD;  Location: [facility] INVASIVE CV LAB;  Service: Cardiovascular;  Laterality: N/A;   CARDIAC CATHETERIZATION N/A 11/21/2015   Procedure: Coronary Stent Intervention;  Surgeon: Tonny Bollman, MD;  Location: [facility] INVASIVE CV LAB;  Service: Cardiovascular;  Laterality: N/A;   CARDIAC CATHETERIZATION  11/2017   CARDIOVERSION N/A 03/26/2018   Procedure:  CARDIOVERSION;  Surgeon: Parke Poisson, MD;  Location: [facility] ENDOSCOPY;  Service: Cardiovascular;  Laterality: N/A;   COLONOSCOPY  01/16/2015   per Dr. Christella Hartigan, benign polyps, repeat in 10 yrs    CORONARY ARTERY BYPASS GRAFT N/A 01/27/2018   Procedure: CORONARY ARTERY BYPASS GRAFTING (CABG) x two, using left internal mammary artery and right leg greater saphenous vein harvested endoscopically;  Surgeon: Purcell Nails, MD;  Location: [facility] OR;  Service: Open Heart Surgery;  Laterality: N/A;   EP IMPLANTABLE DEVICE N/A 11/02/2015   MDT Adivisa MRI  conditional dual chamber pacemaker implanted by Dr Johney Frame for complete heart block   KNEE SURGERY Right 1972   LUMBAR LAMINECTOMY  1996   MITRAL VALVE REPLACEMENT N/A 01/27/2018   Procedure: MITRAL VALVE (MV) REPLACEMENT using Magna Mitral Ease Valve Size ;  Surgeon: Purcell Nails, MD;  Location: [facility] OR;  Service: Open Heart Surgery;  Laterality: N/A;   RIGHT/LEFT HEART CATH AND CORONARY ANGIOGRAPHY N/A 12/02/2017   Procedure: RIGHT/LEFT HEART CATH AND CORONARY ANGIOGRAPHY;  Surgeon: Corky Crafts, MD;  Location: [facility] INVASIVE CV LAB;  Service: Cardiovascular;  Laterality: N/A;   TEE WITHOUT CARDIOVERSION N/A 12/09/2017   Procedure: TRANSESOPHAGEAL ECHOCARDIOGRAM (TEE);  Surgeon: Jodelle Red, MD;  Location: [facility] ENDOSCOPY;  Service: Cardiovascular;  Laterality: N/A;   TEE WITHOUT CARDIOVERSION N/A 01/27/2018   Procedure: TRANSESOPHAGEAL ECHOCARDIOGRAM (TEE);  Surgeon: Purcell Nails, MD;  Location: [facility] OR;  Service: Open Heart Surgery;  Laterality: N/A;   Social History:  reports that he has never smoked. He has never used smokeless tobacco. He reports current alcohol use of about 5.0 standard drinks of alcohol per week. He reports that he does not use drugs.  Allergies  Allergen Reactions   Other     Pt reported allergic to dust mites that causes of sneezing, runny nose    Family History  Problem Relation Age of Onset   Cancer Other        prostate father hx   Heart disease Mother    Heart disease Father    Colon cancer Neg Hx     Prior to Admission medications   Medication Sig Start Date End Date Taking? Authorizing Provider  aspirin EC 81 MG tablet Take 81 mg by mouth daily. 11/16/18  Yes [provider]  atorvastatin (LIPITOR) 40 MG tablet Take 1 tablet by mouth daily 07/05/21  Yes [name], MD  carvedilol (COREG) 6.25 MG tablet Take 1 tablet (6.25 mg total) by mouth 2 (two) times daily with a meal. 07/05/21  Yes [name], MD  levothyroxine  (SYNTHROID) 75 MCG tablet TAKE 1 TABLET BY MOUTH DAILY BEFORE BREAKFAST. 05/24/21 07/27/22 Yes Carlus Pavlov, MD  Multiple Vitamin (MULTIVITAMIN WITH MINERALS) TABS tablet Take 1 tablet by mouth daily.   Yes [provider]  sacubitril-valsartan (ENTRESTO) 24-26 MG Take 1 tablet by mouth 2 (two) times daily. 02/07/22 02/07/23 Yes Chrystie Nose, MD  spironolactone (ALDACTONE) 25 MG tablet TAKE 1/2 TABLET BY MOUTH ONCE A DAY 07/05/21 07/27/22 Yes [name], MD  traZODone (DESYREL) 50 MG tablet Take 1 tablet (50 mg total) by mouth at bedtime. Patient taking differently: Take 100 mg by mouth at bedtime. 07/05/21  Yes [name], MD  sildenafil (VIAGRA) 50 MG tablet Take 1 tablet (50 mg total) by mouth daily as needed for erectile dysfunction. 05/24/20   [name], MD    Physical Exam: Vitals:   06/30/22 1827 06/30/22 1830  06/30/22 1830 06/30/22 2226  BP: (!) 143/76 126/70  125/67  Pulse: (!) 56 (!) 57  (!) 57  Resp:  18  18  Temp:    98.4 F (36.9 C)  TempSrc:    Oral  SpO2: 96% 96%  94%  Weight:   86.6 kg   Height:   5\' 11"  (1.803 m)    Physical Exam:  General: No acute distress, well developed, well nourished HEENT: Normocephalic, atraumatic, PERRL Cardiovascular: Normal rate and rhythm. Distal pulses intact. Pulmonary: Normal pulmonary effort, normal breath sounds Gastrointestinal: Nondistended abdomen, soft, non-tender, normoactive bowel sounds, no organomegaly Musculoskeletal: left leg in traction Lymphadenopathy: No cervical LAD. Skin: Skin is warm and dry. Neuro: No focal deficits noted, AAOx3. PSYCH: Attentive and cooperative   Data Reviewed:  Results for orders placed or performed during the hospital encounter of 06/30/22 (from the past 24 hour(s))  Basic metabolic panel     Status: Abnormal   Collection Time: 06/30/22  6:45 PM  Result Value Ref Range   Sodium 136 135 - 145 mmol/L   Potassium 4.3 3.5 - 5.1 mmol/L   Chloride 104 98 - 111 mmol/L    CO2 24 22 - 32 mmol/L   Glucose, Bld 108 (H) 70 - 99 mg/dL   BUN 18 8 - 23 mg/dL   Creatinine, Ser 1.61 0.61 - 1.24 mg/dL   Calcium 9.0 8.9 - 09.6 mg/dL   GFR, Estimated >04 >54 mL/min   Anion gap 8 5 - 15  CBC     Status: Abnormal   Collection Time: 06/30/22  6:45 PM  Result Value Ref Range   WBC 8.8 4.0 - 10.5 K/uL   RBC 4.03 (L) 4.22 - 5.81 MIL/uL   Hemoglobin 12.9 (L) 13.0 - 17.0 g/dL   HCT 09.8 (L) 11.9 - 14.7 %   MCV 95.3 80.0 - 100.0 fL   MCH 32.0 26.0 - 34.0 pg   MCHC 33.6 30.0 - 36.0 g/dL   RDW 82.9 56.2 - 13.0 %   Platelets 312 150 - 400 K/uL   nRBC 0.0 0.0 - 0.2 %  Protime-INR     Status: None   Collection Time: 06/30/22  6:45 PM  Result Value Ref Range   Prothrombin Time 14.6 11.4 - 15.2 seconds   INR 1.1 0.8 - 1.2     Assessment and Plan: Left Femur fracture -management per orthopedic surgery - pain conrol - DVT prophyllaxis per ortho Compensated systolic heart failure.  EF equal to 35% - patient appears euvolemic S/p pacemaker placement- He is scheduled for an upgrade to a biV pacemaker on June 10, approximately 3 weeks from now.    Advance Care Planning:   Code Status: Full Code   Consults: [facility].  He follows with Dr. Lewayne Bunting of [facility]  Family Communication: none  Severity of Illness: The appropriate patient status for this patient is INPATIENT. Inpatient status is judged to be reasonable and necessary in order to provide the required intensity of service to ensure the patient's safety. The patient's presenting symptoms, physical exam findings, and initial radiographic and laboratory data in the context of their chronic comorbidities is felt to place them at high risk for further clinical deterioration. Furthermore, it is not anticipated that the patient will be medically stable for discharge from the hospital within 2 midnights of admission.   * I certify that at the point of admission it is my clinical judgment that the patient  will require inpatient  hospital care spanning beyond 2 midnights from the point of admission due to high intensity of service, high risk for further deterioration and high frequency of surveillance required.*  Author: Buena Irish, MD 07/01/2022 1:22 AM  For on call review [email or web address].

## 2022-07-01 NOTE — Op Note (Addendum)
NAME: Tyler Hendrix MEDICAL RECORD JW:[id number] DATE OF BIRTH:06/09/1955 PHYSICIAN:Wilmot Quevedo Rexene Edison Murphy Duzan, MD  OPERATIVE REPORT  DATE OF PROCEDURE:  07/01/2022  PREOPERATIVE DIAGNOSIS:   1. LEFT FEMORAL SHAFT FRACTURE 2. LEFT KNEE AND THIGH ECCHYMOSIS AND ABRASIONS  POSTOPERATIVE DIAGNOSIS:   1. LEFT FEMORAL SHAFT FRACTURE, ISOLATED 2. STABLE LEFT KNEE  PROCEDURES: 1.  ANTEGRADE INTRAMEDULLARY NAILING OF THE LEFT FEMUR with 11 X 400  mm statically locked nail. 2.  Stress fluoroscopy of the LEFT FEMORAL NECK. 3.  EXAMINATION OF THE LEFT KNEE UNDER ANESTHESIA  SURGEON:  Myrene Galas, MD  ASSISTANT:  Montez Morita, PA-C  ANESTHESIA:  General.  COMPLICATIONS:  None.  TOURNIQUET:  None.  SPECIMENS:  None.    INS AND OUTS:  Please refer to the anesthetic record.  DISPOSITION:  To PACU.  CONDITION:  Stable.  CONTRAINDICATIONS TO DEEP VEIN THROMBOSIS PROPHYLAXIS:  None.  BRIEF SUMMARY OF INDICATIONS FOR PROCEDURE:  The patient is a 67 y.o. involved in a tree cutting injury during which a left femur fracture was sustained.  I discussed the risks and benefits of intramedullary  nailing including the possibility of identifying a femoral neck fracture, malunion, especially rotational deformity, nonunion, DVT, PE, infection, nerve injury, vessel injury and need for further surgery, among others.  We also specifically discussed the  potential for symptomatic hardware and possible subsequent removal.  After acknowledging these risks consent was provided to proceed.  BRIEF SUMMARY OF PROCEDURE:  The patient received preoperative antibiotics, was taken to the operating room where general anesthesia was induced.  Patient was positioned supine on a radiolucent table with a blanket used for a bump under the hip.  A chlorhexidine wash Betadine scrub and paint was performed of the entire lower extremity, and then standard draping.  My assistant, Montez Morita pulled adduction and internal rotation to  facilitate C-arm identification on AP and lateral images of the appropriate starting point. A 2.5 cm incision was made.  The curved cannulated awl advanced into the piriformis fossa and then the threaded guidewire into the center-center position of the proximal femur.  Starting reamer was then used with soft tissue protecting guide.  A ball tip guidewire was then advanced into the proximal femur and across the fracture site while my assistant helped to dial in the alignment and rotation with the assistance of towel bumps.  Once this was across it was placed into a centered position in the  distal femur and then measured for appropriate length.  The femur was then sequentially reamed, encountering chatter at 12.5 mm, reaming to 12.5 mm, and placing a 11 x 400 mm nail.  We then placed a transverse dynamic locking bolt off the jig and 2 lateral to medial distal locking bolts.  I was careful to evaluate for rotation throughout.  I then removed the towel bump and further examined radiographic markers of the lesser trochanter and knee and comparing these to the other side in order to make certain that the rotation was as accurate as possible.  This was followed by using x-ray to evaluate the femoral neck while the hip was brought from extension and internal rotation up to flexion and abduction.  I did not identify a femoral neck fracture on the C-arm images.   Following repair, I also did not identify ligamentous knee injury when applying manual stress under fluoroscopy to the knee.  The wounds were irrigated thoroughly and closed in standard layered fashion.  A sterile, gently compressive dressing was  applied  from foot to thigh.  The patient was then taken to the PACU in stable condition.  Montez Morita, PA-C, was present and assisting throughout.  An assistant was necessary to obtain reduction and to control the leg during intramedullary nailing for  successful fracture repair.  PROGNOSIS:  The patient will be  weightbearing as tolerated on the operative lower extremity with unrestricted range of motion of the hip and knee, and will also be on formal pharmacologic DVT prophylaxis.

## 2022-07-01 NOTE — Consult Note (Signed)
Orthopaedic Trauma Service (OTS) Consult   Patient ID: [name] MRN: [id number] DOB/AGE: Dec 04, 1955 67 y.o.   Reason for Consult: Closed left femur fracture Referring Physician: Duwayne Heck, MD (orthopaedics)   HPI: Tyler Hendrix is an 67 y.o. male who sustained an injury yesterday cutting a tree.  Patient's not exactly sure what exactly happened.  Either the large tree branch hit him or he hit the ladder.  As soon as he hit the ground he knew that his leg was broken.  He was approximately 6 feet up in the air.  Patient was brought to [facility] found to have an isolated midshaft left femur fracture.  Orthopedics was consulted.  Patient was seen and evaluated in the emergency room and placed into pounds of Buck's traction.  He denies any new onset numbness or tingling.  Denies any additional injuries elsewhere other than some superficial abrasions to his extremities.  Pain is relieved with rest and pain medication.  Currently has IV Dilaudid is only lasting 3 hours.  Pain has not gotten any worse in his thigh since his injury.  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  He is scheduled to have some changes to to his pacemaker done in about 3 weeks or so.  History of A-fib with multiple ablations.  Remote history of lymphoma.  History of CAD status post bypass surgery and MVR.  No chronic anticoagulation  Patient is active.  He does go to the gym regularly.  Does not use any ambulatory devices at baseline.  Lives with his wife.  Social alcohol use  No drugs or nicotine, no vaping  Patient works as a Information systems manager in [employer]  Past Medical History:  Diagnosis Date   Abnormal TSH    Arthritis    ddd   Atrial fibrillation and flutter (HCC)    s/p DCCV 03/26/2018   CAD in native artery    a. 11/2015: s/p 2 vessel PCI of the first diagonal branch of the LAD in the mid left circumflex with DES. // s/p  CABG 01/2018   Chronic combined systolic and diastolic CHF (congestive heart failure) (HCC)    sees Dr. Johney Frame    Complete heart block (HCC)    a. s/p Medtronic ppm 10/2015.   History of radiation therapy 2002   Hodgkin disease (HCC) 2002   a. s/p chest radiation and chemotherapy   Hypothyroidism    sees Dr. 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/27/2018   29 mm [facility] Mitral stented bovine pericardial tissue valve    Past Surgical History:  Procedure Laterality Date   A-FLUTTER ABLATION N/A 06/20/2021   Procedure: A-FLUTTER ABLATION;  Surgeon: Marinus Maw, MD;  Location: [facility] INVASIVE CV LAB;  Service: Cardiovascular;  Laterality: N/A;   BIV UPGRADE N/A 02/08/2019   Procedure: UPGRADE TO BIV PPM;  Surgeon: Hillis Range, MD;  Location: [facility] INVASIVE CV LAB;  Service: Cardiovascular;  Laterality: N/A;   CARDIAC CATHETERIZATION N/A 11/02/2015   Procedure:  Left Heart Cath and Coronary Angiography;  Surgeon: Tonny Bollman, MD;  Location: [facility] INVASIVE CV LAB;  Service: Cardiovascular;  Laterality: N/A;   CARDIAC CATHETERIZATION N/A 11/21/2015   Procedure: Coronary Stent Intervention;  Surgeon: Tonny Bollman, MD;  Location: [facility] INVASIVE CV LAB;  Service: Cardiovascular;  Laterality: N/A;   CARDIAC CATHETERIZATION  11/2017   CARDIOVERSION N/A 03/26/2018   Procedure: CARDIOVERSION;  Surgeon: Parke Poisson, MD;  Location: [facility] ENDOSCOPY;  Service: Cardiovascular;  Laterality: N/A;   COLONOSCOPY  01/16/2015   per Dr. Christella Hartigan, benign polyps, repeat in 10 yrs    CORONARY ARTERY BYPASS GRAFT N/A 01/27/2018   Procedure: CORONARY  ARTERY BYPASS GRAFTING (CABG) x two, using left internal mammary artery and right leg greater saphenous vein harvested endoscopically;  Surgeon: Purcell Nails, MD;  Location: [facility] OR;  Service: Open Heart Surgery;  Laterality: N/A;   EP IMPLANTABLE DEVICE N/A 11/02/2015   MDT Adivisa MRI conditional dual chamber pacemaker implanted by Dr Johney Frame for complete heart block   KNEE SURGERY Right 1972   LUMBAR LAMINECTOMY  1996   MITRAL VALVE REPLACEMENT N/A 01/27/2018   Procedure: MITRAL VALVE (MV) REPLACEMENT using Magna Mitral Ease Valve Size ;  Surgeon: Purcell Nails, MD;  Location: [facility] OR;  Service: Open Heart Surgery;  Laterality: N/A;   RIGHT/LEFT HEART CATH AND CORONARY ANGIOGRAPHY N/A 12/02/2017   Procedure: RIGHT/LEFT HEART CATH AND CORONARY ANGIOGRAPHY;  Surgeon: Corky Crafts, MD;  Location: [facility] INVASIVE CV LAB;  Service: Cardiovascular;  Laterality: N/A;   TEE WITHOUT CARDIOVERSION N/A 12/09/2017   Procedure: TRANSESOPHAGEAL ECHOCARDIOGRAM (TEE);  Surgeon: Jodelle Red, MD;  Location: [facility] ENDOSCOPY;  Service: Cardiovascular;  Laterality: N/A;   TEE WITHOUT CARDIOVERSION N/A 01/27/2018   Procedure: TRANSESOPHAGEAL ECHOCARDIOGRAM (TEE);  Surgeon: Purcell Nails, MD;  Location: [facility] OR;  Service: Open Heart Surgery;  Laterality: N/A;    Family History  Problem Relation Age of Onset   Cancer Other        prostate father hx   Heart disease Mother    Heart disease Father    Colon cancer Neg Hx     Social History:  reports that he has never smoked. He has never used smokeless tobacco. He reports current alcohol use of about 5.0 standard drinks of alcohol per week. He reports that he does not use drugs.  Allergies:  Allergies  Allergen Reactions   Other     Pt reported allergic to dust mites that causes of sneezing, runny nose    Medications: Current Outpatient Medications  Medication Instructions   aspirin EC 81 mg, Oral, Daily   atorvastatin (LIPITOR) 40 MG  tablet Take 1 tablet by mouth daily   carvedilol (COREG) 6.25 mg, Oral, 2 times daily with meals   levothyroxine (SYNTHROID) 75 MCG tablet TAKE 1 TABLET BY MOUTH DAILY BEFORE BREAKFAST.   Multiple Vitamin (MULTIVITAMIN WITH MINERALS) TABS tablet 1 tablet, Oral, Daily   sacubitril-valsartan (ENTRESTO) 24-26 MG 1 tablet, Oral, 2 times daily   sildenafil (VIAGRA) 50 mg, Oral, Daily PRN   spironolactone (ALDACTONE) 25 MG tablet TAKE 1/2 TABLET BY MOUTH ONCE A DAY   traZODone (DESYREL) 50 mg, Oral, Daily at bedtime     Results for orders placed or performed during the hospital encounter of 06/30/22 (from the past 48 hour(s))  Basic metabolic panel     Status: Abnormal   Collection Time: 06/30/22  6:45 PM  Result Value Ref Range   Sodium 136 135 - 145  mmol/L   Potassium 4.3 3.5 - 5.1 mmol/L   Chloride 104 98 - 111 mmol/L   CO2 24 22 - 32 mmol/L   Glucose, Bld 108 (H) 70 - 99 mg/dL    Comment: Glucose reference range applies only to samples taken after fasting for at least 8 hours.   BUN 18 8 - 23 mg/dL   Creatinine, Ser 9.56 0.61 - 1.24 mg/dL   Calcium 9.0 8.9 - 38.7 mg/dL   GFR, Estimated >56 >43 mL/min    Comment: (NOTE) Calculated using the CKD-EPI Creatinine Equation (2021)    Anion gap 8 5 - 15    Comment: Performed at [facility], [address] [place], [place] [address]  CBC     Status: Abnormal   Collection Time: 06/30/22  6:45 PM  Result Value Ref Range   WBC 8.8 4.0 - 10.5 K/uL   RBC 4.03 (L) 4.22 - 5.81 MIL/uL   Hemoglobin 12.9 (L) 13.0 - 17.0 g/dL   HCT 88.4 (L) 16.6 - 06.3 %   MCV 95.3 80.0 - 100.0 fL   MCH 32.0 26.0 - 34.0 pg   MCHC 33.6 30.0 - 36.0 g/dL   RDW 01.6 01.0 - 93.2 %   Platelets 312 150 - 400 K/uL   nRBC 0.0 0.0 - 0.2 %    Comment: Performed at [facility], [address], [place], [place] [address]  Protime-INR     Status: None   Collection Time: 06/30/22  6:45 PM  Result Value Ref Range   Prothrombin Time 14.6 11.4 - 15.2 seconds    INR 1.1 0.8 - 1.2    Comment: (NOTE) INR goal varies based on device and disease states. Performed at [facility], [address] [place], [place] [address]   Surgical PCR screen     Status: Abnormal   Collection Time: 06/30/22 11:24 PM   Specimen: Nasal Mucosa; Nasal Swab  Result Value Ref Range   MRSA, PCR NEGATIVE NEGATIVE   Staphylococcus aureus POSITIVE (A) NEGATIVE    Comment: (NOTE) The Xpert SA Assay (FDA approved for NASAL specimens in patients 67 years of age and older), is one component of a comprehensive surveillance program. It is not intended to diagnose infection nor to guide or monitor treatment. Performed at [facility], [address] [place], [place] [address]     DG Knee Left Port  Result Date: 06/30/2022 CLINICAL DATA:  237628 Closed fracture of left distal femur (HCC) 544450 EXAM: PORTABLE LEFT KNEE - 1-2 VIEW COMPARISON:  None Available. FINDINGS: No evidence of fracture, dislocation, or joint effusion. No evidence of arthropathy or other focal bone abnormality. Soft tissues are unremarkable. IMPRESSION: Negative. Electronically Signed   By: Helyn Numbers M.D.   On: 06/30/2022 21:33   DG Femur Portable Min 2 Views Left  Result Date: 06/30/2022 CLINICAL DATA:  Fall off ladder. EXAM: LEFT FEMUR PORTABLE 2 VIEWS COMPARISON:  None Available. FINDINGS: Severely displaced and angulated fracture is seen involving the midportion of the left femoral shaft. Overlapping fracture fragments are noted. IMPRESSION: Severely displaced and angulated left femoral midshaft fracture is noted. Electronically Signed   By: Lupita Raider M.D.   On: 06/30/2022 19:14   DG Pelvis Portable  Result Date: 06/30/2022 CLINICAL DATA:  Fall off ladder. EXAM: PORTABLE PELVIS 1-2 VIEWS COMPARISON:  None Available. FINDINGS: There is no evidence of pelvic fracture or diastasis. No pelvic bone lesions are seen. IMPRESSION: Negative.  Electronically Signed   By: Lupita Raider M.D.    On: 06/30/2022 19:13   DG Chest Portable 1 View  Result Date: 06/30/2022 CLINICAL DATA:  Fall off ladder. EXAM: PORTABLE CHEST 1 VIEW COMPARISON:  February 08, 2019. FINDINGS: Mild cardiomegaly is noted. Status post coronary artery bypass graft and mitral valve repair. Left-sided pacemaker is in grossly good position. Lungs are clear. Bony thorax is unremarkable. IMPRESSION: No acute abnormality seen. Electronically Signed   By: Lupita Raider M.D.   On: 06/30/2022 19:11    Intake/Output      05/20 0701 05/21 0700 05/21 0701 05/22 0700   Urine (mL/kg/hr) 600    Total Output 600    Net -600            Review of Systems  Constitutional:  Negative for chills and fever.  Respiratory:  Negative for shortness of breath.   Cardiovascular:  Negative for chest pain.  Gastrointestinal:  Negative for nausea and vomiting.  Genitourinary:  Negative for dysuria.  Musculoskeletal:        Left thigh pain   Neurological:  Positive for sensory change (some numbness noted at fracture site left thigh). Negative for tingling.   Blood pressure 122/70, pulse 64, temperature 98.2 F (36.8 C), temperature source Oral, resp. rate 16, height 5\' 11"  (1.803 m), weight 86.6 kg, SpO2 94 %. Physical Exam Vitals reviewed.  Constitutional:      General: He is awake. He is not in acute distress.    Appearance: Normal appearance. He is well-groomed and normal weight.     Comments: Very pleasant 67 year old male.  HENT:     Head: Normocephalic and atraumatic.     Mouth/Throat:     Mouth: Mucous membranes are moist.  Eyes:     Extraocular Movements: Extraocular movements intact.  Cardiovascular:     Pulses: Normal pulses.  Pulmonary:     Effort: Pulmonary effort is normal. No accessory muscle usage or respiratory distress.  Musculoskeletal:     Comments: Pelvis No instability with manipulation  Left lower extremity Inspection: 10 pounds of Buck's traction is in place Mild swelling to the left thigh  is noted. Abrasions noted to left thigh.  No active bleeding or drainage noted from his wounds Did not remove Buck's traction to look at the skin on his lower leg Foot is unremarkable  Bony eval: Tenderness to left thigh is noted with gentle palpation Knee is nontender Foot and ankle are nontender  Soft tissue: Swelling to left thigh is noted but the compartments are soft No significant swelling distal to the knee No swelling to the foot  ROM: Not assessed due to acute fracture Sensation: DPN, SPN, TN sensory functions are intact Motor: EHL, FHL, lesser toe motor function intact Ankle flexion, extension, inversion eversion grossly intact  Vascular: Extremity is warm + DP pulse and symmetric to contralateral side Again compartments are soft to his thigh   Right lower extremity             Superficial abrasions noted but no significant traumatic wounds  Nontender hip, knee, ankle and foot             No crepitus or gross motion noted with manipulation of the R leg  No knee or ankle effusion             No pain with axial loading or logrolling of the hip. Negative Stinchfield test   Knee stable to varus/ valgus and anterior/posterior  stress             No pain with manipulation of the ankle or foot             No blocks to motion noted  Sens DPN, SPN, TN intact  Motor EHL, FHL, lesser toe motor, Ext, flex, evers 5/5  DP 2+, No edema             Compartments are soft and nontender, no pain with passive stretching   Bilateral upper extremities UEx shoulder, elbow, wrist, digits-abrasions noted but nothing severe or deep, nontender, no instability, no blocks to motion  Sens  Ax/R/M/U intact  Mot   Ax/ R/ PIN/ M/ AIN/ U intact  Rad 2+    Skin:    General: Skin is warm.     Capillary Refill: Capillary refill takes less than 2 seconds.     Comments: Scattered abrasions noted throughout his upper extremities.  All superficial  Neurological:     General: No focal deficit  present.     Mental Status: He is alert and oriented to person, place, and time.     Comments: Did not assess coordination or gait as patient is in Buck's traction  Psychiatric:        Attention and Perception: Attention and perception normal.        Mood and Affect: Mood and affect normal.        Speech: Speech normal.        Behavior: Behavior normal. Behavior is cooperative.        Thought Content: Thought content normal.        Cognition and Memory: Cognition normal.        Judgment: Judgment normal.     Assessment/Plan:  67 year old male with acute closed left femoral shaft fracture  - struck by tree or fall of ladder with L femur fracture   -Closed left midshaft femur fracture  Patient will need surgical intervention to address  Plan for antegrade intramedullary nailing today  Will likely allow patient to be weightbearing as tolerated postoperatively along with no formal range of motion restrictions   PT and OT evaluations postoperatively  Patient can resume gym activities as he feels comfortable doing so   Titrate pain medication postoperatively  - Pain management:  Multimodal  Transition to orals postoperatively - ABL anemia/Hemodynamics  Monitor - Medical issues   Per primary   I have communicated directly with cardiology for preoperative stratification  - DVT/PE prophylaxis:  Lovenox post op  Will discuss with cardiology given his upcoming pacemaker surgery.  I think it would be fine to do Eliquis or Xarelto for 2 weeks postop and then stop a week before his cardiology procedure  - ID:   Periop abx   - Metabolic Bone Disease:  Check vitamin D levels - Activity:  Activity as tolerated postoperatively - FEN/GI prophylaxis/Foley/Lines:  Currently n.p.o. for surgery and advance diet postop  - Impediments to fracture healing:  Thyroid disease - Dispo:  OR later this afternoon for intramedullary nailing of left femur fracture  Risks and benefits of  operative versus nonoperative treatment reviewed with patient and wish to proceed with surgery    Mearl Latin, PA-C [phone] (C) 07/01/2022, 9:44 AM  [facility] [address] [place] [address] [phone] [phone] (F)    After 5pm and on the weekends please log on to Amion, go to orthopaedics and the look under the [facility] Call for  the provider(s) on call. You can also call our office at [phone] and then follow the prompts to be connected to the call team.

## 2022-07-01 NOTE — Progress Notes (Signed)
Same day note Tyler Hendrix is a 67 y.o. 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  He subsequently had severe left lower extremity pain and was brought into the hospital.  X-ray showed left femur fracture.  Orthopedics was consulted and plan is surgical intervention today.  Patient seen and examined at bedside.  Patient was admitted to the hospital for fall and femur fracture.  At the time of my evaluation, patient complains of pain in the fracture site and requesting pain medication every 3 hourly.  Denies any chest pain, shortness of breath, dyspnea.  Physical examination reveals male not in obvious distress, left chest wall pacemaker in place.  Left lower extremity on traction.  Distal pulses are positive.  Laboratory data and imaging was reviewed  Assessment and Plan:   Left Femur fracture -status post mechanical fall.  Continue traction, pain control.  Orthopedics has been consulted.  Likely operative intervention.  Compensated chronic systolic heart failure.  Last known LV EF equal to 35% - patient appears euvolemic, history of nonischemic cardiomyopathy.  S/p pacemaker placement- He is scheduled for an upgrade to a biV pacemaker on June 10, approximately 3 weeks from now.   No Charge  Signed,  Tenny Craw, MD [facility]

## 2022-07-01 NOTE — Transfer of Care (Signed)
Immediate Anesthesia Transfer of Care Note  Patient: Tyler Hendrix  Procedure(s) Performed: INTRAMEDULLARY (IM) NAIL FEMORAL LEFT ANTEGRADE (Left: Leg Upper)  Patient Location: PACU  Anesthesia Type:General  Level of Consciousness: lethargic and responds to stimulation  Airway & Oxygen Therapy: Patient Spontanous Breathing and Patient connected to nasal cannula oxygen  Post-op Assessment: Report given to RN  Post vital signs: Reviewed and stable  Last Vitals:  Vitals Value Taken Time  BP 102/31 07/01/22 1631  Temp    Pulse 53 07/01/22 1633  Resp 18 07/01/22 1633  SpO2 90 % 07/01/22 1633  Vitals shown include unvalidated device data.  Last Pain:  Vitals:   07/01/22 1304  TempSrc: Oral  PainSc:       Patients Stated Pain Goal: 0 (07/01/22 2841)  Complications:  Encounter Notable Events  Notable Event Outcome Phase Comment  Difficult to intubate - expected  Intraprocedure Filed from anesthesia note documentation.

## 2022-07-02 ENCOUNTER — Ambulatory Visit: Payer: 59

## 2022-07-02 ENCOUNTER — Other Ambulatory Visit (HOSPITAL_COMMUNITY): Payer: Self-pay

## 2022-07-02 ENCOUNTER — Telehealth (HOSPITAL_COMMUNITY): Payer: Self-pay | Admitting: Pharmacy Technician

## 2022-07-02 DIAGNOSIS — S72302A Unspecified fracture of shaft of left femur, initial encounter for closed fracture: Secondary | ICD-10-CM | POA: Diagnosis not present

## 2022-07-02 LAB — BASIC METABOLIC PANEL
Anion gap: 7 (ref 5–15)
BUN: 26 mg/dL — ABNORMAL HIGH (ref 8–23)
CO2: 25 mmol/L (ref 22–32)
Calcium: 8.2 mg/dL — ABNORMAL LOW (ref 8.9–10.3)
Chloride: 99 mmol/L (ref 98–111)
Creatinine, Ser: 1.51 mg/dL — ABNORMAL HIGH (ref 0.61–1.24)
GFR, Estimated: 51 mL/min — ABNORMAL LOW (ref >60)
Glucose, Bld: 146 mg/dL — ABNORMAL HIGH (ref 70–99)
Potassium: 4.8 mmol/L (ref 3.5–5.1)
Sodium: 131 mmol/L — ABNORMAL LOW (ref 135–145)

## 2022-07-02 LAB — CBC
HCT: 31.1 % — ABNORMAL LOW (ref 39.0–52.0)
Hemoglobin: 10.7 g/dL — ABNORMAL LOW (ref 13.0–17.0)
MCH: 32.1 pg (ref 26.0–34.0)
MCHC: 34.4 g/dL (ref 30.0–36.0)
MCV: 93.4 fL (ref 80.0–100.0)
Platelets: 251 10*3/uL (ref 150–400)
RBC: 3.33 MIL/uL — ABNORMAL LOW (ref 4.22–5.81)
RDW: 13.5 % (ref 11.5–15.5)
WBC: 20.6 10*3/uL — ABNORMAL HIGH (ref 4.0–10.5)
nRBC: 0 % (ref 0.0–0.2)

## 2022-07-02 LAB — MAGNESIUM: Magnesium: 2.2 mg/dL (ref 1.7–2.4)

## 2022-07-02 LAB — VITAMIN D 25 HYDROXY (VIT D DEFICIENCY, FRACTURES): Vit D, 25-Hydroxy: 30.19 ng/mL (ref 30–100)

## 2022-07-02 MED ORDER — SODIUM CHLORIDE 0.9 % IV SOLN: INTRAVENOUS | Status: DC

## 2022-07-02 MED ORDER — SODIUM CHLORIDE 0.9 % IV SOLN: INTRAVENOUS | Status: AC

## 2022-07-02 MED ORDER — ACETAMINOPHEN 325 MG PO TABS
650.0000 mg | ORAL_TABLET | Freq: Four times a day (QID) | ORAL | Status: DC
  Administered 2022-07-02 – 2022-07-03 (×4): 650 mg via ORAL
  Filled 2022-07-02 (×5): qty 2

## 2022-07-02 NOTE — TOC Initial Note (Signed)
Transition of Care [facility]) - Initial/Assessment Note    Patient Details  Name: Tyler Hendrix MRN: [id number] Date of Birth: 1955/04/20  Transition of Care [facility]) CM/SW Contact:    Lawerance Sabal, RN Phone Number: 07/02/2022, 11:00 AM  Clinical Narrative:                  Sherron Monday w patient and wife at bedside.  Patient admitted after femur fracture, will work with PT this morning.  He states that he has crutches at home, we discussed DME and they feel a RW would be beneficial, order placed and it will be delivered to the room this morning throgh [employer].  We don't anticipate HH needs at this time, I will continue to follow for PT recs and address if recommended.    Expected Discharge Plan: Home/Self Care Barriers to Discharge: Continued Medical Work up   Patient Goals and CMS Choice Patient states their goals for this hospitalization and ongoing recovery are:: to go home          Expected Discharge Plan and Services   Discharge Planning Services: CM Consult Post Acute Care Choice: Durable Medical Equipment Living arrangements for the past 2 months: Single Family Home                 DME Arranged: Walker rolling DME Agency: [employer] Date DME Agency Contacted: 07/02/22 Time DME Agency Contacted: 1100 Representative spoke with at DME Agency: Orvan July            Prior Living Arrangements/Services Living arrangements for the past 2 months: Single Family Home Lives with:: Spouse              Current home services: DME (crutches)    Activities of Daily Living Home Assistive Devices/Equipment: None ADL Screening (condition at time of admission) Patient's cognitive ability adequate to safely complete daily activities?: Yes Is the patient deaf or have difficulty hearing?: No Does the patient have difficulty seeing, even when wearing glasses/contacts?: No Does the patient have difficulty concentrating, remembering, or making decisions?: No Patient able to  express need for assistance with ADLs?: Yes Does the patient have difficulty dressing or bathing?: Yes Independently performs ADLs?: No Communication: Independent Dressing (OT): Needs assistance Is this a change from baseline?: Change from baseline, expected to last >3 days Grooming: Independent Feeding: Independent Bathing: Needs assistance Is this a change from baseline?: Change from baseline, expected to last >3 days Toileting: Needs assistance Is this a change from baseline?: Change from baseline, expected to last >3days In/Out Bed: Needs assistance Is this a change from baseline?: Change from baseline, expected to last >3 days Walks in Home: Needs assistance Is this a change from baseline?: Change from baseline, expected to last >3 days Does the patient have difficulty walking or climbing stairs?: Yes Weakness of Legs: Left Weakness of Arms/Hands: None  Permission Sought/Granted                  Emotional Assessment              Admission diagnosis:  Femur fracture (HCC) [S72.90XA] Closed fracture of left femur, unspecified fracture morphology, unspecified portion of femur, initial encounter (HCC) [S72.92XA] Patient Active Problem List   Diagnosis Date Noted   Femur fracture (HCC) 06/30/2022   Biventricular pacemaker check 06/19/2022   Atrial fibrillation and flutter (HCC)    COVID-19 virus infection 02/01/2021   Nonischemic cardiomyopathy (HCC) 05/20/2019   Partial tear of right rotator cuff 09/29/2018  AC (acromioclavicular) arthritis 09/29/2018   Anterior dislocation of right shoulder 07/26/2018   Closed fracture of humerus, greater tuberosity 07/26/2018   Acute on chronic combined systolic (congestive) and diastolic (congestive) heart failure (HCC) 03/27/2018   Paroxysmal atrial fibrillation (HCC)    S/P mitral valve replacement with bioprosthetic valve 01/27/2018   S/P CABG x 2 01/27/2018   Incidental pulmonary nodule 01/19/2018   Severe mitral  regurgitation    Acute systolic heart failure (HCC)    Status post coronary artery stent placement 10/17/2016   Hyperlipidemia 04/17/2016   Hypothyroidism 01/01/2016   Biventricular cardiac pacemaker in situ 01/01/2016   Exertional dyspnea 11/21/2015   Coronary artery disease involving native coronary artery of native heart without angina pectoris    Complete heart block (HCC) 10/04/2015   PVCs (premature ventricular contractions) 10/04/2015   Essential hypertension 06/22/2015   Junctional bradycardia 11/29/2014   History of radiation therapy 06/15/2014   RBBB 10/26/2013   OTHER ACUTE SINUSITIS 02/09/2007   History of Hodgkin's disease 11/04/2006   PCP:  [name], MD Pharmacy:   [facility] [address], [place] - [address] [place] [address] Phone: [phone] Fax: [phone]  [facility] - [place], [place] - [address] AT [facility] [address] [place] [address] Phone: [phone] Fax: [phone]  [place] [facility] [address] [place] [address] Phone: [phone] Fax: [phone]     Social Determinants of Health (SDOH) Social History: SDOH Screenings   Food Insecurity: No Food Insecurity (06/30/2022)  Housing: Low Risk  (06/30/2022)  Transportation Needs: No Transportation Needs (06/30/2022)  Utilities: Not At Risk (06/30/2022)  Depression (PHQ2-9): Low Risk  (03/19/2022)  Tobacco Use: Low Risk  (07/01/2022)   SDOH Interventions:     Readmission Risk Interventions     No data to display

## 2022-07-02 NOTE — Progress Notes (Addendum)
PROGRESS NOTE    Tyler Hendrix  WGN:[id number] DOB: 24-May-1955 DOA: 06/30/2022 PCP: [name], MD    Brief Narrative:  Tyler Hendrix is a 67 y.o. 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  He subsequently had severe left lower extremity pain and was brought into the hospital.  X-ray showed left femur fracture.  Orthopedics was consulted and patient underwent surgical intervention.  Assessment and Plan:   Left Femur fracture -status post mechanical fall.  Patient was initially on Plavix and and subsequently underwent IM nailing of the femur by orthopedics 07/01/2022.  PT OT consultation.  Further plan as per orthopedic.     chronic systolic heart failure.  Last known LV EF equal to 35% -compensated at this time.  History of nonischemic cardiomyopathy.  Seen by cardiology during this hospitalization.  S/p pacemaker placement- She is scheduled for an upgrade to a biV pacemaker on June 10, approximately 3 weeks from now.   Leukocytosis.  Could be reactive.  Unlikely to be infected.  Check CBC in AM.  Mild hyponatremia.  Will closely monitor.  Continue with normal saline.  Check BMP in AM.  Elevated creatinine likely acute kidney injury..  Creatinine of 1.5 today.  Will continue with IV fluids 75 mill per hour for 10 hours..  Creatinine on presentation at 1.1.   DVT prophylaxis: enoxaparin (LOVENOX) injection 40 mg Start: 07/02/22 0800 SCDs Start: 07/01/22 1717   Code Status:     Code Status: Full Code  Disposition: Likely home with home health likely in 1 to 2 days.  PT OT pending.  Status is: Inpatient  Remains inpatient appropriate because: Status post  surgical intervention, need for PT OT evaluation,   Family Communication: Spoke with the patient's wife at bedside.  Consultants:  Orthopedics  Procedures:  Antegrade intramedullary nailing of the left femur on 07/01/2022.  Antimicrobials:  Prophylactic antibiotic  Anti-infectives (From admission, onward)    Start     Dose/Rate Route Frequency Ordered Stop   07/01/22 2000  ceFAZolin (ANCEF) IVPB 2g/100 mL premix        2 g 200 mL/hr over 30 Minutes Intravenous Every 6 hours 07/01/22 1716 07/02/22 0302   07/01/22 0800  ceFAZolin (ANCEF) IVPB 2g/100 mL premix        2 g 200 mL/hr over 30 Minutes Intravenous On call to O.R. 07/01/22 0141 07/01/22 1443       Subjective: Today, patient was seen and examined at bedside.  Complains of pain in the leg when movement.  Awaiting for PT OT evaluation.  Denies any shortness of breath chest pain fever chills or rigors.  Objective: Vitals:   07/01/22 2029 07/02/22 0017 07/02/22 0504 07/02/22 0823  BP: (!) 98/54 (!) 104/56 109/60 (!) 115/55  Pulse: (!) 56  80 63  Resp: 18 17 17 16   Temp: 98.8 F (37.1 C) 98.4 F (36.9 C) 98 F (36.7 C) 97.6 F (36.4 C)  TempSrc: Oral   Oral  SpO2: 90% 98% 98% 90%  Weight:      Height:        Intake/Output Summary (Last 24 hours) at 07/02/2022 1000 Last data filed at 07/02/2022 0350 Gross per 24 hour  Intake 906.03 ml  Output  30 ml  Net 876.03 ml   Filed Weights   06/30/22 1830  Weight: 86.6 kg    Physical Examination: Body mass index is 26.64 kg/m.   General:  Average built, not in obvious distress HENT:   No scleral pallor or icterus noted. Oral mucosa is moist.  Chest:  Clear breath sounds.  Diminished breath sounds bilaterally. No crackles or wheezes.  Left chest wall pacemaker in place. CVS: S1 &S2 heard. No murmur.  Regular rate and rhythm. Abdomen: Soft, nontender, nondistended.  Bowel sounds are heard.   Extremities: No cyanosis, clubbing or edema.  Peripheral pulses are  palpable.  Left lower extremity status post surgical intervention. Psych: Alert, awake and oriented, normal mood CNS:  No cranial nerve deficits.  Power equal in all extremities.   Skin: Warm and dry.    Data Reviewed:   CBC: Recent Labs  Lab 06/30/22 1845 07/01/22 1759 07/02/22 0431  WBC 8.8 19.6* 20.6*  HGB 12.9* 11.4* 10.7*  HCT 38.4* 34.0* 31.1*  MCV 95.3 96.0 93.4  PLT 312 293 251    Basic Metabolic Panel: Recent Labs  Lab 06/30/22 1845 07/01/22 1759 07/02/22 0431  NA 136  --  131*  K 4.3  --  4.8  CL 104  --  99  CO2 24  --  25  GLUCOSE 108*  --  146*  BUN 18  --  26*  CREATININE 1.19 1.41* 1.51*  CALCIUM 9.0  --  8.2*  MG  --   --  2.2    Liver Function Tests: No results for input(s): "AST", "ALT", "ALKPHOS", "BILITOT", "PROT", "ALBUMIN" in the last 168 hours.   Radiology Studies: DG FEMUR PORT MIN 2 VIEWS LEFT  Result Date: 07/01/2022 CLINICAL DATA:  Postop EXAM: LEFT FEMUR PORTABLE 2 VIEWS COMPARISON:  Radiographs dated Jun 30, 2018 FINDINGS: Status post ORIF with intramedullary nail and proximal and distal transfixing screws in near anatomical alignment. The hardware is intact. Generalized soft tissue swelling about the left thigh. IMPRESSION: Status post ORIF with intramedullary nail and proximal and distal transfixing screws in near anatomical alignment. Electronically Signed   By: Larose Hires D.O.   On: 07/01/2022 19:28   DG FEMUR MIN 2 VIEWS LEFT  Result Date: 07/01/2022 CLINICAL DATA:  Left femur fracture repair EXAM: LEFT FEMUR 2 VIEWS COMPARISON:  06/30/2022 FINDINGS: Nine fluoroscopic images are obtained during the performance of the procedure and are provided for interpretation only. Intramedullary rod with proximal and distal interlocking screws traverses a femoral diaphyseal fracture. Alignment is anatomic. Please refer to operative report. Fluoroscopy time: 102.4 seconds, 17.98 mGy IMPRESSION: 1. ORIF mid left femur fracture as above.  Anatomic  alignment. Electronically Signed   By: Sharlet Salina M.D.   On: 07/01/2022 16:29   DG C-Arm 1-60 Min-No Report  Result Date: 07/01/2022 Fluoroscopy was utilized by the requesting physician.  No radiographic interpretation.   DG C-Arm 1-60 Min-No Report  Result Date: 07/01/2022 Fluoroscopy was utilized by the requesting physician.  No radiographic interpretation.   DG Knee Left Port  Result Date: 06/30/2022 CLINICAL DATA:  [id number] Closed fracture of left distal femur (HCC) 544450 EXAM: PORTABLE LEFT KNEE - 1-2 VIEW COMPARISON:  None Available. FINDINGS: No evidence of fracture, dislocation, or joint effusion. No evidence of arthropathy or other focal bone abnormality. Soft tissues are unremarkable. IMPRESSION: Negative. Electronically Signed   By: Helyn Numbers M.D.   On: 06/30/2022 21:33   DG Femur Portable Min 2 Views Left  Result Date: 06/30/2022 CLINICAL DATA:  Fall off ladder. EXAM: LEFT FEMUR PORTABLE 2 VIEWS COMPARISON:  None Available. FINDINGS: Severely displaced and angulated fracture is seen involving the midportion of the left femoral shaft. Overlapping fracture fragments are noted. IMPRESSION: Severely displaced and angulated left femoral midshaft fracture is noted. Electronically Signed   By: Lupita Raider M.D.   On: 06/30/2022 19:14   DG Pelvis Portable  Result Date: 06/30/2022 CLINICAL DATA:  Fall off ladder. EXAM: PORTABLE PELVIS 1-2 VIEWS COMPARISON:  None Available. FINDINGS: There is no evidence of pelvic fracture or diastasis. No pelvic bone lesions are seen. IMPRESSION: Negative. Electronically Signed   By: Lupita Raider M.D.   On: 06/30/2022 19:13   DG Chest Portable 1 View  Result Date: 06/30/2022 CLINICAL DATA:  Fall off ladder. EXAM: PORTABLE CHEST 1 VIEW COMPARISON:  February 08, 2019. FINDINGS: Mild cardiomegaly is noted. Status post coronary artery bypass graft and mitral valve repair. Left-sided pacemaker is in grossly good position. Lungs are clear. Bony  thorax is unremarkable. IMPRESSION: No acute abnormality seen. Electronically Signed   By: Lupita Raider M.D.   On: 06/30/2022 19:11      LOS: 2 days     Joycelyn Das, MD [facility] Available via Epic secure chat 7am-7pm After these hours, please refer to coverage provider listed on [email or web address] 07/02/2022, 10:00 AM

## 2022-07-02 NOTE — Evaluation (Signed)
Occupational Therapy Evaluation Patient Details Name: Tyler Hendrix MRN: [id number] DOB: [birth year] Today's Date: 07/02/2022   History of Present Illness Patient is a 67 year old male presenting after trauma involving a ladder. Left femoral shaft fracture s/p IM nail. History of pacemaker placement, CAD and CABG, cardiomyopathy, lymphoma, atrial fibrillation.   Clinical Impression   This 67 yo male admitted and underwent above presents to acute OT with PLOF of being totally independent with basic ADLs, IADLs, working, and driving. Currently he is setup/S-max A for basic ADLs and min-min guard A for all mobility from a RW level. He will continue to benefit from acute OT without need for follow up.     Recommendations for follow up therapy are one component of a multi-disciplinary discharge planning process, led by the attending physician.  Recommendations may be updated based on patient status, additional functional criteria and insurance authorization.   Assistance Recommended at Discharge Frequent or constant Supervision/Assistance  Patient can return home with the following A little help with walking and/or transfers;A lot of help with bathing/dressing/bathroom;Assistance with cooking/housework;Assist for transportation;Help with stairs or ramp for entrance    Functional Status Assessment  Patient has had a recent decline in their functional status and demonstrates the ability to make significant improvements in function in a reasonable and predictable amount of time.  Equipment Recommendations  Other (comment) (may need 3n1 (TBD tomorrow))       Precautions / Restrictions Precautions Precautions: Fall Restrictions Weight Bearing Restrictions: No LLE Weight Bearing: Weight bearing as tolerated      Mobility Bed Mobility Overal bed mobility: Needs Assistance Bed Mobility: Sit to Supine       Sit to supine: Mod assist   General bed mobility comments: A for legs     Transfers Overall transfer level: Needs assistance Equipment used: Rolling walker (2 wheels) Transfers: Sit to/from Stand Sit to Stand: Min assist, From elevated surface           General transfer comment: verbal cues for hand placement for safety and LLE positioning for comfort with transfers and increased time      Balance Overall balance assessment: Needs assistance Sitting-balance support: Feet supported, No upper extremity supported Sitting balance-Leahy Scale: Fair     Standing balance support: During functional activity, Single extremity supported Standing balance-Leahy Scale: Poor Standing balance comment: standing at sink to shave and wash face                           ADL either performed or assessed with clinical judgement   ADL Overall ADL's : Needs assistance/impaired Eating/Feeding: Independent;Sitting   Grooming: Min guard;Standing;Wash/dry face Grooming Details (indicate cue type and reason): shave at sink in bathroom Upper Body Bathing: Set up;Sitting   Lower Body Bathing: Moderate assistance Lower Body Bathing Details (indicate cue type and reason): min A sit<>stand with increased time from raised bed Upper Body Dressing : Set up;Sitting   Lower Body Dressing: Maximal assistance Lower Body Dressing Details (indicate cue type and reason): min A sit<>stand with increased time from raised bed Toilet Transfer: Minimal assistance;Ambulation;Rolling walker (2 wheels) Toilet Transfer Details (indicate cue type and reason): simulated bed>door>sink in bathroom>sit EOB; We discussed the possible need for either a 3n1 or toilet handles for current toliet. He is suppose to try toilet transfer later today with staff with him to go into bathroom and wife stablizing the walker for the sit<>stand to/from toilet Toileting- Clothing Manipulation and  Hygiene: Minimal assistance;Sit to/from stand     Tub/Shower Transfer Details (indicate cue type and reason):  We discussed the transfer into a shower stall (in with the good, out with the bad) and using the RW in the shower stall with him         Vision Patient Visual Report: No change from baseline              Pertinent Vitals/Pain Pain Assessment Pain Assessment: 0-10 Pain Score: 7  Pain Location: L hip with certain movements, no pain at rest Pain Descriptors / Indicators: Jabbing, Spasm Pain Intervention(s): Limited activity within patient's tolerance, Monitored during session, Repositioned     Hand Dominance Right   Extremity/Trunk Assessment Upper Extremity Assessment Upper Extremity Assessment: Overall WFL for tasks assessed           Communication Communication Communication: No difficulties   Cognition Arousal/Alertness: Awake/alert   Overall Cognitive Status: Within Functional Limits for tasks assessed                                                  Home Living Family/patient expects to be discharged to:: Private residence Living Arrangements: Spouse/significant other;Children Available Help at Discharge: Family;Available 24 hours/day Type of Home: House Home Access: Stairs to enter [employer] of Steps: 3 Entrance Stairs-Rails: None Home Layout: Able to live on main level with bedroom/bathroom     Bathroom Shower/Tub: Door;Walk-in Human resources officer: Standard     Home Equipment: Crutches          Prior Functioning/Environment Prior Level of Function : Independent/Modified Independent;Working/employed;Driving                        OT Problem List: Decreased strength;Decreased range of motion;Impaired balance (sitting and/or standing);Decreased activity tolerance;Pain      OT Treatment/Interventions: Self-care/ADL training;DME and/or AE instruction;Patient/family education;Balance training    OT Goals(Current goals can be found in the care plan section) Acute Rehab OT Goals Patient Stated Goal: to  hopefully go home tomorrow OT Goal Formulation: With patient/family Time For Goal Achievement: 07/15/22 Potential to Achieve Goals: Good  OT Frequency: Min 2X/week    Co-evaluation PT/OT/SLP Co-Evaluation/Treatment:  (partial)            AM-PAC OT "6 Clicks" Daily Activity     Outcome Measure Help from another person eating meals?: None Help from another person taking care of personal grooming?: A Little Help from another person toileting, which includes using toliet, bedpan, or urinal?: A Little Help from another person bathing (including washing, rinsing, drying)?: A Lot Help from another person to put on and taking off regular upper body clothing?: A Little Help from another person to put on and taking off regular lower body clothing?: A Lot 6 Click Score: 17   End of Session Equipment Utilized During Treatment: Gait belt;Rolling walker (2 wheels) Nurse Communication: Mobility status  Activity Tolerance: Patient tolerated treatment well Patient left: in bed;with call bell/phone within reach;with family/visitor present  OT Visit Diagnosis: Unsteadiness on feet (R26.81);Other abnormalities of gait and mobility (R26.89);Muscle weakness (generalized) (M62.81);Pain Pain - Right/Left: Left Pain - part of body: Hip                Time: 1610-9604 OT Time Calculation (min): 48 min Charges:  OT General Charges $OT Visit:  1 Visit OT Evaluation $OT Eval Moderate Complexity: 1 Mod OT Treatments $Self Care/Home Management : 8-22 mins Lindon Romp OT Acute Rehabilitation Services Office [phone]    Evette Georges 07/02/2022, 9:56 PM

## 2022-07-02 NOTE — Progress Notes (Signed)
Orthopaedic Trauma Service Progress Note  Patient ID: Tyler Hendrix MRN: [id number] DOB/AGE: 1955-03-02 67 y.o.  Subjective:  Doing ok  Pain controlled with meds Exacerbated with movement   No CP or SOB   Ate breakfast this am + void + flatus No BM but no abd pain   3 stairs to get into house Master bedroom on main level   ROS As above  Objective:   VITALS:   Vitals:   07/01/22 2029 07/02/22 0017 07/02/22 0504 07/02/22 0823  BP: (!) 98/54 (!) 104/56 109/60 (!) 115/55  Pulse: (!) 56  80 63  Resp: 18 17 17 16   Temp: 98.8 F (37.1 C) 98.4 F (36.9 C) 98 F (36.7 C) 97.6 F (36.4 C)  TempSrc: Oral   Oral  SpO2: 90% 98% 98% 90%  Weight:      Height:        Estimated body mass index is 26.64 kg/m as calculated from the following:   Height as of this encounter: 5\' 11"  (1.803 m).   Weight as of this encounter: 86.6 kg.   Intake/Output      05/21 0701 05/22 0700 05/22 0701 05/23 0700   I.V. (mL/kg) 600 (6.9)    IV Piggyback 306    Total Intake(mL/kg) 906 (10.5)    Urine (mL/kg/hr)     Blood 30    Total Output 30    Net +876           LABS  Results for orders placed or performed during the hospital encounter of 06/30/22 (from the past 24 hour(s))  CBC     Status: Abnormal   Collection Time: 07/01/22  5:59 PM  Result Value Ref Range   WBC 19.6 (H) 4.0 - 10.5 K/uL   RBC 3.54 (L) 4.22 - 5.81 MIL/uL   Hemoglobin 11.4 (L) 13.0 - 17.0 g/dL   HCT 40.9 (L) 81.1 - 91.4 %   MCV 96.0 80.0 - 100.0 fL   MCH 32.2 26.0 - 34.0 pg   MCHC 33.5 30.0 - 36.0 g/dL   RDW 78.2 95.6 - 21.3 %   Platelets 293 150 - 400 K/uL   nRBC 0.0 0.0 - 0.2 %  Creatinine, serum     Status: Abnormal   Collection Time: 07/01/22  5:59 PM  Result Value Ref Range   Creatinine, Ser 1.41 (H) 0.61 - 1.24 mg/dL   GFR, Estimated 55 (L) >60 mL/min  Basic metabolic panel     Status: Abnormal   Collection Time:  07/02/22  4:31 AM  Result Value Ref Range   Sodium 131 (L) 135 - 145 mmol/L   Potassium 4.8 3.5 - 5.1 mmol/L   Chloride 99 98 - 111 mmol/L   CO2 25 22 - 32 mmol/L   Glucose, Bld 146 (H) 70 - 99 mg/dL   BUN 26 (H) 8 - 23 mg/dL   Creatinine, Ser 0.86 (H) 0.61 - 1.24 mg/dL   Calcium 8.2 (L) 8.9 - 10.3 mg/dL   GFR, Estimated 51 (L) >60 mL/min   Anion gap 7 5 - 15  CBC     Status: Abnormal   Collection Time: 07/02/22  4:31 AM  Result Value Ref Range   WBC 20.6 (H) 4.0 - 10.5 K/uL   RBC 3.33 (L) 4.22 - 5.81 MIL/uL  [phone] (F)    After 5pm and on the weekends please log on to Amion, go to orthopaedics and the look under the [facility] Call for the provider(s) on call. You can also call our office at [phone] and then follow the prompts to be connected to the call team.  Patient ID: Tyler Hendrix, male   DOB: [birth year], 67 y.o.   MRN: [id number]  Orthopaedic Trauma Service Progress Note  Patient ID: Tyler Hendrix MRN: [id number] DOB/AGE: 1955-03-02 67 y.o.  Subjective:  Doing ok  Pain controlled with meds Exacerbated with movement   No CP or SOB   Ate breakfast this am + void + flatus No BM but no abd pain   3 stairs to get into house Master bedroom on main level   ROS As above  Objective:   VITALS:   Vitals:   07/01/22 2029 07/02/22 0017 07/02/22 0504 07/02/22 0823  BP: (!) 98/54 (!) 104/56 109/60 (!) 115/55  Pulse: (!) 56  80 63  Resp: 18 17 17 16   Temp: 98.8 F (37.1 C) 98.4 F (36.9 C) 98 F (36.7 C) 97.6 F (36.4 C)  TempSrc: Oral   Oral  SpO2: 90% 98% 98% 90%  Weight:      Height:        Estimated body mass index is 26.64 kg/m as calculated from the following:   Height as of this encounter: 5\' 11"  (1.803 m).   Weight as of this encounter: 86.6 kg.   Intake/Output      05/21 0701 05/22 0700 05/22 0701 05/23 0700   I.V. (mL/kg) 600 (6.9)    IV Piggyback 306    Total Intake(mL/kg) 906 (10.5)    Urine (mL/kg/hr)     Blood 30    Total Output 30    Net +876           LABS  Results for orders placed or performed during the hospital encounter of 06/30/22 (from the past 24 hour(s))  CBC     Status: Abnormal   Collection Time: 07/01/22  5:59 PM  Result Value Ref Range   WBC 19.6 (H) 4.0 - 10.5 K/uL   RBC 3.54 (L) 4.22 - 5.81 MIL/uL   Hemoglobin 11.4 (L) 13.0 - 17.0 g/dL   HCT 40.9 (L) 81.1 - 91.4 %   MCV 96.0 80.0 - 100.0 fL   MCH 32.2 26.0 - 34.0 pg   MCHC 33.5 30.0 - 36.0 g/dL   RDW 78.2 95.6 - 21.3 %   Platelets 293 150 - 400 K/uL   nRBC 0.0 0.0 - 0.2 %  Creatinine, serum     Status: Abnormal   Collection Time: 07/01/22  5:59 PM  Result Value Ref Range   Creatinine, Ser 1.41 (H) 0.61 - 1.24 mg/dL   GFR, Estimated 55 (L) >60 mL/min  Basic metabolic panel     Status: Abnormal   Collection Time:  07/02/22  4:31 AM  Result Value Ref Range   Sodium 131 (L) 135 - 145 mmol/L   Potassium 4.8 3.5 - 5.1 mmol/L   Chloride 99 98 - 111 mmol/L   CO2 25 22 - 32 mmol/L   Glucose, Bld 146 (H) 70 - 99 mg/dL   BUN 26 (H) 8 - 23 mg/dL   Creatinine, Ser 0.86 (H) 0.61 - 1.24 mg/dL   Calcium 8.2 (L) 8.9 - 10.3 mg/dL   GFR, Estimated 51 (L) >60 mL/min   Anion gap 7 5 - 15  CBC     Status: Abnormal   Collection Time: 07/02/22  4:31 AM  Result Value Ref Range   WBC 20.6 (H) 4.0 - 10.5 K/uL   RBC 3.33 (L) 4.22 - 5.81 MIL/uL

## 2022-07-02 NOTE — Telephone Encounter (Signed)
Pharmacy Patient Advocate Encounter  Insurance verification completed.    The patient is insured through [employer]   The patient is currently admitted and ran test claims for the following: Farxiga, Jardiance.  Copays and coinsurance results were relayed to Inpatient clinical team.

## 2022-07-02 NOTE — Progress Notes (Signed)
Postop day 1 ORIF by Dr. Carola Frost.  Tolerated procedure well.  No cardiac issues.  Okay for discharge from our point of view.  Will need to decide about his l pacemaker lead repositioning already scheduled on 6/10 by Dr. Ladona Ridgel.  Runell Gess, M.D., FACP, [facility], Earl Lagos [facility] [address] [place], [place]  [address]  [phone] 07/02/2022 10:10 AM

## 2022-07-02 NOTE — Evaluation (Signed)
Physical Therapy Evaluation Patient Details Name: Tyler Hendrix MRN: [id number] DOB: October 17, 1955 Today's Date: 07/02/2022  History of Present Illness  Patient is a 67 year old male presenting after trauma involving a ladder. Left femoral shaft fracture s/p IM nail. History of pacemaker placement, CAD and CABG, cardiomyopathy, lymphoma, atrial fibrillation.  Clinical Impression  Patient agreeable to PT evaluation and had been pre-medicated prior to arrival. Patient reports no pain at rest with moderate pain in the left hip with walking. Patient is usually independent at lives with his spouse. There are three steps to enter and he is able to live on the main floor.  Today, the patient required physical assistance for bed mobility and transfers. Safety tips and techniques to increased independence with mobility provided. Gait training initiated with rolling walker with cues for improved gait kinematics. Increased time and effort required with all mobility. Anticipate patient will require intermittent physical assistance at discharge with mobility tasks initially. Recommend to continue PT to maximize independence and decrease caregiver burden.      Recommendations for follow up therapy are one component of a multi-disciplinary discharge planning process, led by the attending physician.  Recommendations may be updated based on patient status, additional functional criteria and insurance authorization.  Follow Up Recommendations       Assistance Recommended at Discharge Intermittent Supervision/Assistance  Patient can return home with the following  A little help with walking and/or transfers;A little help with bathing/dressing/bathroom;Help with stairs or ramp for entrance;Assist for transportation;Assistance with cooking/housework    Equipment Recommendations Rolling walker (2 wheels)  Recommendations for Other Services       Functional Status Assessment Patient has had a recent decline in  their functional status and demonstrates the ability to make significant improvements in function in a reasonable and predictable amount of time.     Precautions / Restrictions Precautions Precautions: Fall Restrictions Weight Bearing Restrictions: Yes LLE Weight Bearing: Weight bearing as tolerated      Mobility  Bed Mobility Overal bed mobility: Needs Assistance Bed Mobility: Supine to Sit     Supine to sit: Mod assist     General bed mobility comments: assistance for LLE and trunk support to exit left side of bed. verbal cues for technique. increased time and effort required    Transfers Overall transfer level: Needs assistance Equipment used: Rolling walker (2 wheels) Transfers: Sit to/from Stand Sit to Stand: Min assist, From elevated surface           General transfer comment: verbal cues for hand placement for safety and LLE positioning for comfort with transfers    Ambulation/Gait Ambulation/Gait assistance: Min assist, Min guard Gait Distance (Feet): 25 Feet Assistive device: Rolling walker (2 wheels) Gait Pattern/deviations: Step-to pattern, Decreased step length - left, Decreased stance time - left, Wide base of support, Trunk flexed Gait velocity: decreased     General Gait Details: cues for rolling walker and BLE sequencing. steadying assistance provided initially. cues also provide for step length and to decrease wide base of support. several standing rest breaks required with significant extra time to complete walking bout with UE fatigue reported. no dizziness  Stairs            Wheelchair Mobility    Modified Rankin (Stroke Patients Only)       Balance Overall balance assessment: Needs assistance Sitting-balance support: Feet supported Sitting balance-Leahy Scale: Fair     Standing balance support: Bilateral upper extremity supported, During functional activity Standing balance-Leahy Scale: Poor  Standing balance comment: patient  relying on rolling walker for support with ambulation or at least unilateral support when standing to shave with electric razor. Min guard provided for safety                             Pertinent Vitals/Pain Pain Assessment Pain Assessment: Faces Faces Pain Scale: Hurts even more Pain Location: L hip with movement, no pain at rest Pain Descriptors / Indicators: Discomfort, Grimacing, Guarding Pain Intervention(s): Limited activity within patient's tolerance, Monitored during session, Repositioned, Premedicated before session    Home Living Family/patient expects to be discharged to:: Private residence Living Arrangements: Spouse/significant other;Children Available Help at Discharge: Family Type of Home: House Home Access: Stairs to enter Entrance Stairs-Rails: None Entrance Stairs-Number of Steps: 3   Home Layout: Able to live on main level with bedroom/bathroom Home Equipment: Crutches      Prior Function Prior Level of Function : Independent/Modified Independent;Working/employed;Driving                     Hand Dominance        Extremity/Trunk Assessment   Upper Extremity Assessment Upper Extremity Assessment: Overall WFL for tasks assessed    Lower Extremity Assessment Lower Extremity Assessment: LLE deficits/detail LLE Deficits / Details: patient able to activate hip, knee, ankle movement in gravity eliminated position with no knee buckling with weight bearing LLE Sensation: WNL       Communication   Communication: No difficulties  Cognition Arousal/Alertness: Awake/alert Behavior During Therapy: WFL for tasks assessed/performed Overall Cognitive Status: Within Functional Limits for tasks assessed                                          General Comments      Exercises     Assessment/Plan    PT Assessment Patient needs continued PT services  PT Problem List Decreased strength;Decreased range of motion;Decreased  activity tolerance;Decreased safety awareness;Decreased knowledge of use of DME;Decreased mobility;Decreased balance       PT Treatment Interventions DME instruction;Gait training;Stair training;Functional mobility training;Therapeutic activities;Therapeutic exercise;Cognitive remediation;Neuromuscular re-education;Balance training;Patient/family education;Wheelchair mobility training    PT Goals (Current goals can be found in the Care Plan section)  Acute Rehab PT Goals Patient Stated Goal: to be able to walk PT Goal Formulation: With patient Time For Goal Achievement: 07/09/22 Potential to Achieve Goals: Good    Frequency Min 4X/week     Co-evaluation               AM-PAC PT "6 Clicks" Mobility  Outcome Measure Help needed turning from your back to your side while in a flat bed without using bedrails?: A Little Help needed moving from lying on your back to sitting on the side of a flat bed without using bedrails?: A Lot Help needed moving to and from a bed to a chair (including a wheelchair)?: A Little Help needed standing up from a chair using your arms (e.g., wheelchair or bedside chair)?: A Little Help needed to walk in hospital room?: A Little Help needed climbing 3-5 steps with a railing? : A Lot 6 Click Score: 16    End of Session Equipment Utilized During Treatment: Gait belt Activity Tolerance: Patient limited by fatigue;Patient limited by pain Patient left: in bed;with call bell/phone within reach;with family/visitor present (seated on edge of  bed, OT and spouse at the bedside) Nurse Communication: Mobility status PT Visit Diagnosis: Other abnormalities of gait and mobility (R26.89);Difficulty in walking, not elsewhere classified (R26.2)    Time: 8657-8469 PT Time Calculation (min) (ACUTE ONLY): 43 min   Charges:   PT Evaluation $PT Eval Low Complexity: 1 Low PT Treatments $Gait Training: 8-22 mins       Donna Bernard, PT, MPT   Ina Homes 07/02/2022, 12:42 PM

## 2022-07-02 NOTE — Hospital Course (Signed)
Postop day 1 ORIF.  Patient was cleared for procedure from cardiology perspective.  He is stable from cardiology point of view.  Okay for discharge from our perspective.  Will need to decide about rescheduling his left lead repositionings already scheduled with Dr. Ladona Ridgel on 6/10.  Runell Gess, M.D., FACP, [facility], Earl Lagos [facility] [address] [place], [place]  [address]  [phone] 07/02/2022 10:09 AM

## 2022-07-02 NOTE — TOC Benefit Eligibility Note (Signed)
Patient Product/process development scientist completed.    The patient is currently admitted and upon discharge could be taking Jardiance 10 mg.  The current 30 day co-pay is $88.42 due to a $3000 deductible.   The patient is currently admitted and upon discharge could be taking Farxiga  10 mg.  The current 30 day co-pay is $108.01 due to a $3000 deductible.   The patient is insured through [employer]   This test claim was processed through [facility]- copay amounts may vary at other pharmacies due to pharmacy/plan contracts, or as the patient moves through the different stages of their insurance plan.  Roland Earl, CPHT Pharmacy Patient Advocate Specialist [facility] Patient Advocate Team Direct Number: [phone]  Fax: [phone]

## 2022-07-03 ENCOUNTER — Encounter (HOSPITAL_COMMUNITY): Payer: Self-pay | Admitting: Orthopedic Surgery

## 2022-07-03 ENCOUNTER — Other Ambulatory Visit (HOSPITAL_COMMUNITY): Payer: Self-pay

## 2022-07-03 DIAGNOSIS — I10 Essential (primary) hypertension: Secondary | ICD-10-CM

## 2022-07-03 DIAGNOSIS — S72302A Unspecified fracture of shaft of left femur, initial encounter for closed fracture: Secondary | ICD-10-CM | POA: Diagnosis not present

## 2022-07-03 DIAGNOSIS — Z950 Presence of cardiac pacemaker: Secondary | ICD-10-CM | POA: Diagnosis not present

## 2022-07-03 DIAGNOSIS — E039 Hypothyroidism, unspecified: Secondary | ICD-10-CM

## 2022-07-03 DIAGNOSIS — Z951 Presence of aortocoronary bypass graft: Secondary | ICD-10-CM

## 2022-07-03 LAB — BASIC METABOLIC PANEL
Anion gap: 10 (ref 5–15)
BUN: 34 mg/dL — ABNORMAL HIGH (ref 8–23)
CO2: 23 mmol/L (ref 22–32)
Calcium: 8.4 mg/dL — ABNORMAL LOW (ref 8.9–10.3)
Chloride: 101 mmol/L (ref 98–111)
Creatinine, Ser: 1.44 mg/dL — ABNORMAL HIGH (ref 0.61–1.24)
GFR, Estimated: 54 mL/min — ABNORMAL LOW (ref >60)
Glucose, Bld: 121 mg/dL — ABNORMAL HIGH (ref 70–99)
Potassium: 4.6 mmol/L (ref 3.5–5.1)
Sodium: 134 mmol/L — ABNORMAL LOW (ref 135–145)

## 2022-07-03 LAB — CBC
HCT: 29.8 % — ABNORMAL LOW (ref 39.0–52.0)
Hemoglobin: 10 g/dL — ABNORMAL LOW (ref 13.0–17.0)
MCH: 31.8 pg (ref 26.0–34.0)
MCHC: 33.6 g/dL (ref 30.0–36.0)
MCV: 94.9 fL (ref 80.0–100.0)
Platelets: 250 10*3/uL (ref 150–400)
RBC: 3.14 MIL/uL — ABNORMAL LOW (ref 4.22–5.81)
RDW: 13.7 % (ref 11.5–15.5)
WBC: 17.3 10*3/uL — ABNORMAL HIGH (ref 4.0–10.5)
nRBC: 0 % (ref 0.0–0.2)

## 2022-07-03 LAB — MAGNESIUM: Magnesium: 2 mg/dL (ref 1.7–2.4)

## 2022-07-03 MED ORDER — OXYCODONE-ACETAMINOPHEN 5-325 MG PO TABS
1.0000 | ORAL_TABLET | ORAL | 0 refills | Status: DC | PRN
  Filled 2022-07-03: qty 20, 4d supply, fill #0

## 2022-07-03 MED ORDER — DOCUSATE SODIUM 100 MG PO CAPS
100.0000 mg | ORAL_CAPSULE | Freq: Two times a day (BID) | ORAL | 0 refills | Status: DC
  Filled 2022-07-03 – 2022-10-14 (×2): qty 10, 5d supply, fill #0

## 2022-07-03 MED ORDER — CHOLECALCIFEROL 125 MCG (5000 UT) PO TABS
ORAL_TABLET | Freq: Every day | ORAL | 6 refills | Status: AC
  Filled 2022-07-03 – 2022-10-14 (×2): qty 30, 30d supply, fill #0
  Filled 2022-12-28: qty 30, 30d supply, fill #1
  Filled 2023-05-12: qty 30, 30d supply, fill #2

## 2022-07-03 MED ORDER — METHOCARBAMOL 500 MG PO TABS
500.0000 mg | ORAL_TABLET | Freq: Four times a day (QID) | ORAL | 0 refills | Status: DC | PRN
  Filled 2022-07-03: qty 60, 8d supply, fill #0

## 2022-07-03 MED ORDER — METHOCARBAMOL 1000 MG PO TABS
1000.0000 mg | ORAL_TABLET | Freq: Three times a day (TID) | ORAL | 0 refills | Status: DC
  Filled 2022-07-03: qty 15, 5d supply, fill #0

## 2022-07-03 MED ORDER — APIXABAN 2.5 MG PO TABS
2.5000 mg | ORAL_TABLET | Freq: Two times a day (BID) | ORAL | 0 refills | Status: DC
  Filled 2022-07-03: qty 42, 21d supply, fill #0

## 2022-07-03 NOTE — Anesthesia Postprocedure Evaluation (Signed)
Anesthesia Post Note  Patient: Tyler Hendrix  Procedure(s) Performed: INTRAMEDULLARY (IM) NAIL FEMORAL LEFT ANTEGRADE (Left: Leg Upper)     Patient location during evaluation: PACU Anesthesia Type: General Level of consciousness: patient cooperative and awake Pain management: pain level controlled Vital Signs Assessment: post-procedure vital signs reviewed and stable Respiratory status: spontaneous breathing, nonlabored ventilation, respiratory function stable and patient connected to nasal cannula oxygen Cardiovascular status: blood pressure returned to baseline and stable Postop Assessment: no apparent nausea or vomiting Anesthetic complications: yes   Encounter Notable Events  Notable Event Outcome Phase Comment  Difficult to intubate - expected  Intraprocedure Filed from anesthesia note documentation.    Last Vitals:  Vitals:   07/02/22 2148 07/03/22 0400  BP: (!) 108/51 (!) 115/56  Pulse: 64 88  Resp: 16 17  Temp: 36.8 C 36.6 C  SpO2: 93% 98%    Last Pain:  Vitals:   07/03/22 0634  TempSrc:   PainSc: 4                  Lamone Ferrelli

## 2022-07-03 NOTE — Progress Notes (Signed)
Physical Therapy Treatment Patient Details Name: Tyler Hendrix MRN: [id number] DOB: 09-Oct-1955 Today's Date: 07/03/2022   History of Present Illness Patient is a 67 year old male presenting after trauma involving a ladder. Left femoral shaft fracture s/p IM nail. History of pacemaker placement, CAD and CABG, cardiomyopathy, lymphoma, atrial fibrillation.    PT Comments    Pt received standing at EOB with wife present and supportive throughout session. Pt performing static marching with difficulty elevating BLE that improved with increased repetitions and cues. Pt able to tolerate short gait distance in the room, but continues to be limited by impaired activity tolerance and LLE pain. Pt able to perform stair trial with wife present and educated on guarding techniques. Discussed home set up and safe navigation to reduce fall risk. Anticipate pt and family will be able to navigate pt's mobility needs at home.    Recommendations for follow up therapy are one component of a multi-disciplinary discharge planning process, led by the attending physician.  Recommendations may be updated based on patient status, additional functional criteria and insurance authorization.     Assistance Recommended at Discharge Intermittent Supervision/Assistance  Patient can return home with the following A little help with walking and/or transfers;A little help with bathing/dressing/bathroom;Help with stairs or ramp for entrance;Assist for transportation;Assistance with cooking/housework   Equipment Recommendations  Rolling walker (2 wheels)    Recommendations for Other Services       Precautions / Restrictions Precautions Precautions: Fall Restrictions Weight Bearing Restrictions: Yes LLE Weight Bearing: Weight bearing as tolerated     Mobility  Bed Mobility               General bed mobility comments: Pt OOB upon entry    Transfers Overall transfer level: Needs assistance Equipment used:  Rolling walker (2 wheels) Transfers: Sit to/from Stand Sit to Stand: Min assist           General transfer comment: Min A for power up and education on LLE placement for decreased pain    Ambulation/Gait Ambulation/Gait assistance: Min guard Gait Distance (Feet): 15 Feet Assistive device: Rolling walker (2 wheels) Gait Pattern/deviations: Step-to pattern, Trunk flexed, Decreased stance time - left, Antalgic Gait velocity: decreased   Pre-gait activities: marching at EOB General Gait Details: Pt demonstrating slow step-to pattern with difficulty clearing BLE. Cues for L knee extension during stance phase and upright posture.   Stairs Stairs: Yes Stairs assistance: Min guard Stair Management: With walker, Backwards Number of Stairs: 2 General stair comments: Pt instructed in technique with RW and pt able to demo with min guard and assist for blocking the RW. Pt's wife instructed in safe guarding/assist technique with her able to demonstrate      Balance Overall balance assessment: Needs assistance Sitting-balance support: Feet supported, No upper extremity supported Sitting balance-Leahy Scale: Fair Sitting balance - Comments: sitting in recliner   Standing balance support: During functional activity, Bilateral upper extremity supported Standing balance-Leahy Scale: Poor Standing balance comment: with RW support                            Cognition Arousal/Alertness: Awake/alert Behavior During Therapy: WFL for tasks assessed/performed Overall Cognitive Status: Within Functional Limits for tasks assessed  Exercises      General Comments General comments (skin integrity, edema, etc.): Pt's wife present and supportive throughout session      Pertinent Vitals/Pain Pain Assessment Pain Assessment: Faces Faces Pain Scale: Hurts whole lot Pain Location: L hip with movement Pain Descriptors /  Indicators: Aching, Jabbing Pain Intervention(s): Monitored during session, Limited activity within patient's tolerance     PT Goals (current goals can now be found in the care plan section) Acute Rehab PT Goals Patient Stated Goal: to be able to walk PT Goal Formulation: With patient Time For Goal Achievement: 07/09/22 Potential to Achieve Goals: Good Progress towards PT goals: Progressing toward goals    Frequency    Min 4X/week      PT Plan Current plan remains appropriate       AM-PAC PT "6 Clicks" Mobility   Outcome Measure  Help needed turning from your back to your side while in a flat bed without using bedrails?: A Little Help needed moving from lying on your back to sitting on the side of a flat bed without using bedrails?: A Lot Help needed moving to and from a bed to a chair (including a wheelchair)?: A Little Help needed standing up from a chair using your arms (e.g., wheelchair or bedside chair)?: A Little Help needed to walk in hospital room?: A Little Help needed climbing 3-5 steps with a railing? : A Little 6 Click Score: 17    End of Session Equipment Utilized During Treatment: Gait belt Activity Tolerance: Patient tolerated treatment well Patient left: in chair;with nursing/sitter in room;with call bell/phone within reach Nurse Communication: Mobility status PT Visit Diagnosis: Other abnormalities of gait and mobility (R26.89);Difficulty in walking, not elsewhere classified (R26.2)     Time: 1610-9604 PT Time Calculation (min) (ACUTE ONLY): 27 min  Charges:  $Gait Training: 23-37 mins                     Johny Shock, PTA Acute Rehabilitation Services Secure Chat Preferred  [phone]    Johny Shock 07/03/2022, 1:23 PM

## 2022-07-03 NOTE — Progress Notes (Signed)
Occupational Therapy Treatment Patient Details Name: Tyler Hendrix MRN: [id number] DOB: 1956-02-09 Today's Date: 07/03/2022   History of present illness Patient is a 67 year old male presenting after trauma involving a ladder. Left femoral shaft fracture s/p IM nail. History of pacemaker placement, CAD and CABG, cardiomyopathy, lymphoma, atrial fibrillation.   OT comments  Pt continuing to progress in patient focused goals, pt needing more physical assist in today's session due to pain with LLE movement. Educated pt on the use of gait belt as leg lifter, pt reports less pain but bed mobility still painful and slow. Pt needing Mod A for STS with cues for hand placement and verbal cues for proper gait stride with impaired LLE. Educated pt on the use of AE to complete lower body dressing and bathing, pt demonstrated ability to don/doff socks with setup supervision assist + AE. OT will attempt to follow-up with patient later in the day when wife is available due to increased need for caregiver assist to provide training. DC plans remain appropriate at this time, no follow-up OT recommended pending progression with pain tolerance.    Recommendations for follow up therapy are one component of a multi-disciplinary discharge planning process, led by the attending physician.  Recommendations may be updated based on patient status, additional functional criteria and insurance authorization.    Assistance Recommended at Discharge Frequent or constant Supervision/Assistance  Patient can return home with the following  A little help with walking and/or transfers;A lot of help with bathing/dressing/bathroom;Assistance with cooking/housework;Assist for transportation;Help with stairs or ramp for entrance   Equipment Recommendations  BSC/3in1;Other (comment) (sockaid, reacher, long handle sponge, shoe horn)    Recommendations for Other Services      Precautions / Restrictions Precautions Precautions:  Fall Restrictions Weight Bearing Restrictions: Yes LLE Weight Bearing: Weight bearing as tolerated       Mobility Bed Mobility Overal bed mobility: Needs Assistance Bed Mobility: Sit to Supine, Sidelying to Sit   Sidelying to sit: Mod assist   Sit to supine: Mod assist   General bed mobility comments: assist for LLE. Educated pt on the use of gait belt as leg lifter, not providing pt much success at this time    Transfers Overall transfer level: Needs assistance Equipment used: Rolling walker (2 wheels) Transfers: Sit to/from Stand Sit to Stand: Mod assist           General transfer comment: verbal cues for hand placement for safety. Mod A STS from [facility]     Balance Overall balance assessment: Needs assistance Sitting-balance support: Feet supported, No upper extremity supported Sitting balance-Leahy Scale: Fair     Standing balance support: During functional activity, Single extremity supported Standing balance-Leahy Scale: Poor                             ADL either performed or assessed with clinical judgement   ADL Overall ADL's : Needs assistance/impaired     Grooming: Standing;Min guard Grooming Details (indicate cue type and reason): shave at sink in bathroom             Lower Body Dressing: Set up;Sitting/lateral leans;With adaptive equipment Lower Body Dressing Details (indicate cue type and reason): donning/doffing R sock using sockaid and reacher Toilet Transfer: Ambulation;Rolling walker (2 wheels);Min guard Toilet Transfer Details (indicate cue type and reason): slow gait pattern, Mod A STS with cues to push off using hand rails  Functional mobility during ADLs: Min guard;Rolling walker (2 wheels) General ADL Comments: Educated pt on the use of AE to complete lower body dressing and bathing.    Extremity/Trunk Assessment              Vision       Perception     Praxis      Cognition Arousal/Alertness:  Awake/alert Behavior During Therapy: WFL for tasks assessed/performed Overall Cognitive Status: Within Functional Limits for tasks assessed                                          Exercises      Shoulder Instructions       General Comments VSS on RA    Pertinent Vitals/ Pain       Pain Assessment Pain Assessment: Faces Faces Pain Scale: Hurts whole lot Pain Location: L hip with movement Pain Descriptors / Indicators: Aching, Jabbing Pain Intervention(s): Monitored during session, Limited activity within patient's tolerance, Repositioned  Home Living                                          Prior Functioning/Environment              Frequency  Min 2X/week        Progress Toward Goals  OT Goals(current goals can now be found in the care plan section)     Acute Rehab OT Goals Patient Stated Goal: to go home OT Goal Formulation: With patient/family Time For Goal Achievement: 07/15/22 Potential to Achieve Goals: Good  Plan      Co-evaluation                 AM-PAC OT "6 Clicks" Daily Activity     Outcome Measure   Help from another person eating meals?: None Help from another person taking care of personal grooming?: A Little Help from another person toileting, which includes using toliet, bedpan, or urinal?: A Little Help from another person bathing (including washing, rinsing, drying)?: A Lot Help from another person to put on and taking off regular upper body clothing?: A Little Help from another person to put on and taking off regular lower body clothing?: A Little (with AE) 6 Click Score: 18    End of Session Equipment Utilized During Treatment: Gait belt;Rolling walker (2 wheels)  OT Visit Diagnosis: Unsteadiness on feet (R26.81);Other abnormalities of gait and mobility (R26.89);Muscle weakness (generalized) (M62.81);Pain Pain - Right/Left: Left Pain - part of body: Hip   Activity Tolerance Patient  tolerated treatment well   Patient Left in bed;with call bell/phone within reach   Nurse Communication Mobility status        Time: 4098-1191 OT Time Calculation (min): 51 min  Charges: OT General Charges $OT Visit: 1 Visit OT Treatments $Self Care/Home Management : 23-37 mins $Therapeutic Activity: 8-22 mins  07/03/2022  AB, OTR/L  Acute Rehabilitation Services  Office: [phone]   Tristan Schroeder 07/03/2022, 9:49 AM

## 2022-07-03 NOTE — Progress Notes (Signed)
Pt education completed to include future appointments, current prescriptions and medications, and doctors discharge instructions. Pt alert and oriented, vital signs stable. Pt discharged with nursing staff via wheel chair. All questions answered to patient satisfaction. Patient and wife feel comfortable discharging home. Temp: 98 F (36.7 C) (05/23 0811) BP: 126/64 (05/23 0811) Pulse Rate: 75 (05/23 0811)  Jessica Priest 07/03/2022 1:19 PM

## 2022-07-03 NOTE — TOC Transition Note (Addendum)
Transition of Care [facility]) - CM/SW Discharge Note   Patient Details  Name: Tyler Hendrix MRN: [id number] Date of Birth: 1955/05/22  [facility] of [facility]) CM/SW Contact:  Leone Haven, RN Phone Number: 07/03/2022, 10:23 AM   Clinical Narrative:    Patient is for dc today, NCM offered choice for HHPT, he states he does not have a preference, NCM made referral to [facility] with Frances Furbish , he is able to take referral.  Soc will begin 24 to 48 hrs post dc.  He has a walker and will need 3 n 1 added to his DME.  NCM made referral to [facility] for the 3 n 1, which will be brought up to his room. TOC to fill medications.    Final next level of care: Home w Home Health Services Barriers to Discharge: No Barriers Identified   Patient Goals and CMS Choice CMS Medicare.gov Compare Post Acute Care list provided to:: Patient Choice offered to / list presented to : Patient  Discharge Placement                         Discharge Plan and Services Additional resources added to the After Visit Summary for     Discharge Planning Services: CM Consult Post Acute Care Choice: Durable Medical Equipment          DME Arranged: 3-N-1, Walker rolling DME Agency: [employer] Date DME Agency Contacted: 07/02/22 Time DME Agency Contacted: 1100 Representative spoke with at DME Agency: Vaughan Basta HH Arranged: PT HH Agency: [facility] Date [facility] Agency Contacted: 07/03/22 Time HH Agency Contacted: 1022 Representative spoke with at [facility] Agency: Kandee Keen  Social Determinants of Health (SDOH) Interventions SDOH Screenings   Food Insecurity: No Food Insecurity (06/30/2022)  Housing: Low Risk  (06/30/2022)  Transportation Needs: No Transportation Needs (06/30/2022)  Utilities: Not At Risk (06/30/2022)  Depression (PHQ2-9): Low Risk  (03/19/2022)  Tobacco Use: Low Risk  (07/01/2022)     Readmission Risk Interventions     No data to display

## 2022-07-03 NOTE — Discharge Summary (Signed)
Physician Discharge Summary  Tyler Hendrix:[id number] DOB: 06-Aug-1955 DOA: 06/30/2022  PCP: [name], MD  Admit date: 06/30/2022 Discharge date: 07/03/2022  Admitted From: Home  Discharge disposition: Home with home PT  Recommendations for Outpatient Follow-Up:   Follow up with your primary care provider in one week.  Check CBC, BMP, magnesium in 3 to 5 days. Follow-up with orthopedics in 10 days. Follow-up with cardiology as outpatient for pacemaker upgrading on July 20, 2020.  Discharge Diagnosis:   Principal Problem:   Femur fracture (HCC) Active Problems:   Essential hypertension   Hypothyroidism   Biventricular cardiac pacemaker in situ   S/P CABG x 2  Discharge Condition: Improved.  Diet recommendation: Low sodium, heart healthy.   Wound care: None.  Code status: Full.   History of Present Illness:   Tyler Hendrix is a 67 y.o. 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  Patient subsequently had severe left lower extremity pain and was brought into the hospital.  X-ray showed left femur fracture.  Orthopedics was consulted and patient underwent surgical intervention.   Hospital Course:   Following conditions were addressed during hospitalization as listed below,  Left Femur fracture status post mechanical fall.   Patient seen by orthopedics and subsequently underwent IM nailing of the femur by orthopedics 07/01/2022.  Physical therapy and Occupational Therapy has seen the patient today and recommend home health PT on discharge.   chronic systolic heart failure.  Last known LV EF equal to 35% -compensated at this time.  History of  nonischemic cardiomyopathy.  Seen by cardiology during this hospitalization.  Will follow-up with cardiology as outpatient.   S/p pacemaker placement-patient is scheduled for an upgrade to a biV pacemaker on June 10   Leukocytosis.  Likely reactive.  No signs of infection at this time.  Would recommend CBC in few days.   Mild hyponatremia.  Sodium level of 134, check BMP in AM.   Mild elevated creatinine likely acute kidney injury.  Creatinine of 1. 1.4 today.  Received normal saline yesterday.  Creatinine on presentation at 1.1.  With need outpatient follow-up with BMP.  Disposition.  At this time, patient is stable for disposition home with outpatient PCP and orthopedic follow-up.  Medical Consultants:   Orthopedics   Procedures:    Antegrade intramedullary nailing of the left femur on 07/01/2022.  Subjective:   Today, patient was seen and examined at bedside.  Denies any shortness of breath dizziness lightheadedness chest pain.  Was able to ambulate some with some assistance.  Discharge Exam:   Vitals:   07/03/22 0400 07/03/22 0811  BP: (!) 115/56 126/64  Pulse: 88 75  Resp: 17 18  Temp: 97.9 F (36.6 C) 98 F (36.7 C)  SpO2: 98% 91%   Vitals:   07/02/22 1154 07/02/22 2148 07/03/22 0400 07/03/22 0811  BP: 116/60 (!) 108/51 (!) 115/56 126/64  Pulse: 68 64 88 75  Resp: 18 16 17 18   Temp: 98.8 F (37.1 C) 98.3 F (36.8 C) 97.9 F (36.6 C) 98 F (36.7 C)  TempSrc:  Oral    SpO2: 90% 93% 98% 91%  Weight:      Height:       Body mass  index is 26.64 kg/m.  General: Alert awake, not in obvious distress HENT: pupils equally reacting to light,  No scleral pallor or icterus noted. Oral mucosa is moist.  Chest:  Clear breath sounds. No crackles or wheezes.  Left chest wall pacer in place. CVS: S1 &S2 heard. No murmur.  Regular rate and rhythm. Abdomen: Soft, nontender, nondistended.  Bowel sounds are heard.   Extremities: No cyanosis, clubbing or edema.  Left lower  extremity status post surgical intervention Psych: Alert, awake and oriented, normal mood CNS:  No cranial nerve deficits.  Power equal in all extremities.   Skin: Warm and dry.  No rashes noted.  The results of significant diagnostics from this hospitalization (including imaging, microbiology, ancillary and laboratory) are listed below for reference.     Diagnostic Studies:   DG FEMUR PORT MIN 2 VIEWS LEFT  Result Date: 07/01/2022 CLINICAL DATA:  Postop EXAM: LEFT FEMUR PORTABLE 2 VIEWS COMPARISON:  Radiographs dated Jun 30, 2018 FINDINGS: Status post ORIF with intramedullary nail and proximal and distal transfixing screws in near anatomical alignment. The hardware is intact. Generalized soft tissue swelling about the left thigh. IMPRESSION: Status post ORIF with intramedullary nail and proximal and distal transfixing screws in near anatomical alignment. Electronically Signed   By: Larose Hires D.O.   On: 07/01/2022 19:28   DG FEMUR MIN 2 VIEWS LEFT  Result Date: 07/01/2022 CLINICAL DATA:  Left femur fracture repair EXAM: LEFT FEMUR 2 VIEWS COMPARISON:  06/30/2022 FINDINGS: Nine fluoroscopic images are obtained during the performance of the procedure and are provided for interpretation only. Intramedullary rod with proximal and distal interlocking screws traverses a femoral diaphyseal fracture. Alignment is anatomic. Please refer to operative report. Fluoroscopy time: 102.4 seconds, 17.98 mGy IMPRESSION: 1. ORIF mid left femur fracture as above.  Anatomic alignment. Electronically Signed   By: Sharlet Salina M.D.   On: 07/01/2022 16:29   DG C-Arm 1-60 Min-No Report  Result Date: 07/01/2022 Fluoroscopy was utilized by the requesting physician.  No radiographic interpretation.   DG C-Arm 1-60 Min-No Report  Result Date: 07/01/2022 Fluoroscopy was utilized by the requesting physician.  No radiographic interpretation.   DG Knee Left Port  Result Date: 06/30/2022 CLINICAL DATA:  [id number] Closed  fracture of left distal femur (HCC) [id number] EXAM: PORTABLE LEFT KNEE - 1-2 VIEW COMPARISON:  None Available. FINDINGS: No evidence of fracture, dislocation, or joint effusion. No evidence of arthropathy or other focal bone abnormality. Soft tissues are unremarkable. IMPRESSION: Negative. Electronically Signed   By: Helyn Numbers M.D.   On: 06/30/2022 21:33   DG Femur Portable Min 2 Views Left  Result Date: 06/30/2022 CLINICAL DATA:  Fall off ladder. EXAM: LEFT FEMUR PORTABLE 2 VIEWS COMPARISON:  None Available. FINDINGS: Severely displaced and angulated fracture is seen involving the midportion of the left femoral shaft. Overlapping fracture fragments are noted. IMPRESSION: Severely displaced and angulated left femoral midshaft fracture is noted. Electronically Signed   By: Lupita Raider M.D.   On: 06/30/2022 19:14   DG Pelvis Portable  Result Date: 06/30/2022 CLINICAL DATA:  Fall off ladder. EXAM: PORTABLE PELVIS 1-2 VIEWS COMPARISON:  None Available. FINDINGS: There is no evidence of pelvic fracture or diastasis. No pelvic bone lesions are seen. IMPRESSION: Negative. Electronically Signed   By: Lupita Raider M.D.   On: 06/30/2022 19:13   DG Chest Portable 1 View  Result Date: 06/30/2022 CLINICAL DATA:  Fall off ladder. EXAM: PORTABLE CHEST 1 VIEW COMPARISON:  February 08, 2019. FINDINGS: Mild cardiomegaly is noted. Status post coronary artery bypass graft and mitral valve repair. Left-sided pacemaker is in grossly good position. Lungs are clear. Bony thorax is unremarkable. IMPRESSION: No acute abnormality seen. Electronically Signed   By: Lupita Raider M.D.   On: 06/30/2022 19:11     Labs:   Basic Metabolic Panel: Recent Labs  Lab 06/30/22 1845 07/01/22 1759 07/02/22 0431 07/03/22 0134  NA 136  --  131* 134*  K 4.3  --  4.8 4.6  CL 104  --  99 101  CO2 24  --  25 23  GLUCOSE 108*  --  146* 121*  BUN 18  --  26* 34*  CREATININE 1.19 1.41* 1.51* 1.44*  CALCIUM 9.0  --  8.2*  8.4*  MG  --   --  2.2 2.0   GFR Estimated Creatinine Clearance: 53.7 mL/min (A) (by C-G formula based on SCr of 1.44 mg/dL (H)). Liver Function Tests: No results for input(s): "AST", "ALT", "ALKPHOS", "BILITOT", "PROT", "ALBUMIN" in the last 168 hours. No results for input(s): "LIPASE", "AMYLASE" in the last 168 hours. No results for input(s): "AMMONIA" in the last 168 hours. Coagulation profile Recent Labs  Lab 06/30/22 1845  INR 1.1    CBC: Recent Labs  Lab 06/30/22 1845 07/01/22 1759 07/02/22 0431 07/03/22 0134  WBC 8.8 19.6* 20.6* 17.3*  HGB 12.9* 11.4* 10.7* 10.0*  HCT 38.4* 34.0* 31.1* 29.8*  MCV 95.3 96.0 93.4 94.9  PLT 312 293 251 250   Cardiac Enzymes: No results for input(s): "CKTOTAL", "CKMB", "CKMBINDEX", "TROPONINI" in the last 168 hours. BNP: Invalid input(s): "POCBNP" CBG: No results for input(s): "GLUCAP" in the last 168 hours. D-Dimer No results for input(s): "DDIMER" in the last 72 hours. Hgb A1c No results for input(s): "HGBA1C" in the last 72 hours. Lipid Profile No results for input(s): "CHOL", "HDL", "LDLCALC", "TRIG", "CHOLHDL", "LDLDIRECT" in the last 72 hours. Thyroid function studies No results for input(s): "TSH", "T4TOTAL", "T3FREE", "THYROIDAB" in the last 72 hours.  Invalid input(s): "FREET3" Anemia work up No results for input(s): "VITAMINB12", "FOLATE", "FERRITIN", "TIBC", "IRON", "RETICCTPCT" in the last 72 hours. Microbiology Recent Results (from the past 240 hour(s))  Surgical PCR screen     Status: Abnormal   Collection Time: 06/30/22 11:24 PM   Specimen: Nasal Mucosa; Nasal Swab  Result Value Ref Range Status   MRSA, PCR NEGATIVE NEGATIVE Final   Staphylococcus aureus POSITIVE (A) NEGATIVE Final    Comment: (NOTE) The Xpert SA Assay (FDA approved for NASAL specimens in patients 71 years of age and older), is one component of a comprehensive surveillance program. It is not intended to diagnose infection nor to guide or  monitor treatment. Performed at [facility], [address] [place], [place] [address]      Discharge Instructions:   Discharge Instructions     Call MD for:  redness, tenderness, or signs of infection (pain, swelling, redness, odor or green/yellow discharge around incision site)   Complete by: As directed    Call MD for:  severe uncontrolled pain   Complete by: As directed    Call MD for:  temperature >100.4   Complete by: As directed    Diet general   Complete by: As directed    Discharge instructions   Complete by: As directed    Follow-up with your primary care provider in 1 week.  Check blood work at that time.  Follow-up with orthopedics as outpatient  for postsurgical follow-up/wound checkup.  Seek medical attention for worsening symptoms.  Weightbearing as tolerated.  No driving until cleared by orthopaedics   Increase activity slowly   Complete by: As directed       Allergies as of 07/03/2022       Reactions   Other    Pt reported allergic to dust mites that causes of sneezing, runny nose        Medication List     TAKE these medications    apixaban 2.5 MG Tabs tablet Commonly known as: Eliquis Take 1 tablet (2.5 mg total) by mouth 2 (two) times daily for 21 days.   aspirin EC 81 MG tablet Take 81 mg by mouth daily.   atorvastatin 40 MG tablet Commonly known as: LIPITOR Take 1 tablet by mouth daily   carvedilol 6.25 MG tablet Commonly known as: COREG Take 1 tablet (6.25 mg total) by mouth 2 (two) times daily with a meal.   Cholecalciferol 125 MCG (5000 UT) Tabs Take 1 tablet by mouth daily.   docusate sodium 100 MG capsule Commonly known as: COLACE Take 1 capsule (100 mg total) by mouth 2 (two) times daily.   Entresto 24-26 MG Generic drug: sacubitril-valsartan Take 1 tablet by mouth 2 (two) times daily.   levothyroxine 75 MCG tablet Commonly known as: SYNTHROID TAKE 1 TABLET BY MOUTH DAILY BEFORE BREAKFAST.   methocarbamol 500  MG tablet Commonly known as: ROBAXIN Take 1-2 tablets (500-1,000 mg total) by mouth every 6 (six) hours as needed for muscle spasms.   multivitamin with minerals Tabs tablet Take 1 tablet by mouth daily.   oxyCODONE-acetaminophen 5-325 MG tablet Commonly known as: Percocet Take 1 tablet by mouth every 4 (four) hours as needed for severe pain.   sildenafil 50 MG tablet Commonly known as: Viagra Take 1 tablet (50 mg total) by mouth daily as needed for erectile dysfunction.   spironolactone 25 MG tablet Commonly known as: ALDACTONE TAKE 1/2 TABLET BY MOUTH ONCE A DAY   traZODone 50 MG tablet Commonly known as: DESYREL Take 1 tablet (50 mg total) by mouth at bedtime. What changed: how much to take               Durable Medical Equipment  (From admission, onward)           Start     Ordered   07/03/22 0911  For home use only DME Bedside commode  Once       Question:  Patient needs a bedside commode to treat with the following condition  Answer:  Weakness   07/03/22 0911   07/02/22 1100  For home use only DME Walker rolling  Once       Question Answer Comment  Walker: With 5 Inch Wheels   Patient needs a walker to treat with the following condition Weakness      07/02/22 1059            Follow-up Information     [name], MD Follow up in 1 week(s).   Specialty: Family Medicine Contact information: [address] [place] [address] [phone]         Myrene Galas, MD Follow up in 10 day(s).   Specialty: Orthopedic Surgery Why: post op followup Contact information: [address] [place] [address] [phone]         [facility] Follow up.   Specialty: Home Health Services Why: Agency will call you to set  up apt times Contact information: [address] [place] [address] [phone]         [facility] Follow up.   Why: rolling walker, bedside commode Contact information: [phone]  [phone]                 Time coordinating discharge: 39 minutes  Signed:  Dorn Hartshorne  [facility] 07/03/2022, 10:39 AM

## 2022-07-03 NOTE — Progress Notes (Signed)
Orthopaedic Trauma Service Progress Note  Patient ID: Tyler Hendrix MRN: [id number] DOB/AGE: 1955/08/19 67 y.o.  Subjective:  Doing well Pain controlled Working with therapy  Wants to go home today   Labs and vitals look good    ROS As above Objective:   VITALS:   Vitals:   07/02/22 1154 07/02/22 2148 07/03/22 0400 07/03/22 0811  BP: 116/60 (!) 108/51 (!) 115/56 126/64  Pulse: 68 64 88 75  Resp: 18 16 17 18   Temp: 98.8 F (37.1 C) 98.3 F (36.8 C) 97.9 F (36.6 C) 98 F (36.7 C)  TempSrc:  Oral    SpO2: 90% 93% 98% 91%  Weight:      Height:        Estimated body mass index is 26.64 kg/m as calculated from the following:   Height as of this encounter: 5\' 11"  (1.803 m).   Weight as of this encounter: 86.6 kg.   Intake/Output      05/22 0701 05/23 0700 05/23 0701 05/24 0700   P.O. 360    I.V. (mL/kg)     IV Piggyback     Total Intake(mL/kg) 360 (4.2)    Urine (mL/kg/hr) 500 (0.2)    Blood     Total Output 500    Net -140           LABS  Results for orders placed or performed during the hospital encounter of 06/30/22 (from the past 24 hour(s))  CBC     Status: Abnormal   Collection Time: 07/03/22  1:34 AM  Result Value Ref Range   WBC 17.3 (H) 4.0 - 10.5 K/uL   RBC 3.14 (L) 4.22 - 5.81 MIL/uL   Hemoglobin 10.0 (L) 13.0 - 17.0 g/dL   HCT 40.9 (L) 81.1 - 91.4 %   MCV 94.9 80.0 - 100.0 fL   MCH 31.8 26.0 - 34.0 pg   MCHC 33.6 30.0 - 36.0 g/dL   RDW 78.2 95.6 - 21.3 %   Platelets 250 150 - 400 K/uL   nRBC 0.0 0.0 - 0.2 %  Magnesium     Status: None   Collection Time: 07/03/22  1:34 AM  Result Value Ref Range   Magnesium 2.0 1.7 - 2.4 mg/dL  Basic metabolic panel     Status: Abnormal   Collection Time: 07/03/22  1:34 AM  Result Value Ref Range   Sodium 134 (L) 135 - 145 mmol/L   Potassium 4.6 3.5 - 5.1 mmol/L   Chloride 101 98 - 111 mmol/L   CO2 23 22 - 32 mmol/L    Glucose, Bld 121 (H) 70 - 99 mg/dL   BUN 34 (H) 8 - 23 mg/dL   Creatinine, Ser 0.86 (H) 0.61 - 1.24 mg/dL   Calcium 8.4 (L) 8.9 - 10.3 mg/dL   GFR, Estimated 54 (L) >60 mL/min   Anion gap 10 5 - 15    Latest Reference Range & Units 07/02/22 04:31  Vitamin D, 25-Hydroxy 30 - 100 ng/mL 30.19    PHYSICAL EXAM:   Gen: Working with OT and transferring back to the bed Lungs:  unlabored Ext:       Left Lower extremity  Orthopaedic Trauma Service Progress Note  Patient ID: Tyler Hendrix MRN: [id number] DOB/AGE: 1955/08/19 67 y.o.  Subjective:  Doing well Pain controlled Working with therapy  Wants to go home today   Labs and vitals look good    ROS As above Objective:   VITALS:   Vitals:   07/02/22 1154 07/02/22 2148 07/03/22 0400 07/03/22 0811  BP: 116/60 (!) 108/51 (!) 115/56 126/64  Pulse: 68 64 88 75  Resp: 18 16 17 18   Temp: 98.8 F (37.1 C) 98.3 F (36.8 C) 97.9 F (36.6 C) 98 F (36.7 C)  TempSrc:  Oral    SpO2: 90% 93% 98% 91%  Weight:      Height:        Estimated body mass index is 26.64 kg/m as calculated from the following:   Height as of this encounter: 5\' 11"  (1.803 m).   Weight as of this encounter: 86.6 kg.   Intake/Output      05/22 0701 05/23 0700 05/23 0701 05/24 0700   P.O. 360    I.V. (mL/kg)     IV Piggyback     Total Intake(mL/kg) 360 (4.2)    Urine (mL/kg/hr) 500 (0.2)    Blood     Total Output 500    Net -140           LABS  Results for orders placed or performed during the hospital encounter of 06/30/22 (from the past 24 hour(s))  CBC     Status: Abnormal   Collection Time: 07/03/22  1:34 AM  Result Value Ref Range   WBC 17.3 (H) 4.0 - 10.5 K/uL   RBC 3.14 (L) 4.22 - 5.81 MIL/uL   Hemoglobin 10.0 (L) 13.0 - 17.0 g/dL   HCT 40.9 (L) 81.1 - 91.4 %   MCV 94.9 80.0 - 100.0 fL   MCH 31.8 26.0 - 34.0 pg   MCHC 33.6 30.0 - 36.0 g/dL   RDW 78.2 95.6 - 21.3 %   Platelets 250 150 - 400 K/uL   nRBC 0.0 0.0 - 0.2 %  Magnesium     Status: None   Collection Time: 07/03/22  1:34 AM  Result Value Ref Range   Magnesium 2.0 1.7 - 2.4 mg/dL  Basic metabolic panel     Status: Abnormal   Collection Time: 07/03/22  1:34 AM  Result Value Ref Range   Sodium 134 (L) 135 - 145 mmol/L   Potassium 4.6 3.5 - 5.1 mmol/L   Chloride 101 98 - 111 mmol/L   CO2 23 22 - 32 mmol/L    Glucose, Bld 121 (H) 70 - 99 mg/dL   BUN 34 (H) 8 - 23 mg/dL   Creatinine, Ser 0.86 (H) 0.61 - 1.24 mg/dL   Calcium 8.4 (L) 8.9 - 10.3 mg/dL   GFR, Estimated 54 (L) >60 mL/min   Anion gap 10 5 - 15    Latest Reference Range & Units 07/02/22 04:31  Vitamin D, 25-Hydroxy 30 - 100 ng/mL 30.19    PHYSICAL EXAM:   Gen: Working with OT and transferring back to the bed Lungs:  unlabored Ext:       Left Lower extremity  KNEE REST IN FLEXION!!!!     - Pain management:             Multimodal              Discharge medication sent to the [facility] care pharmacy                          - ABL anemia/Hemodynamics             Stable              Monitor   - Medical issues              Per primary    - DVT/PE prophylaxis:             Lovenox              Eliquis 2.5 mg p.o. twice daily for 21 days, start tomorrow.  Prescription sent to [facility] pharmacy - ID:              Periop abx completed   - Metabolic Bone Disease:             Vitamin d levels in normal range  Recommend vitamin d3 5000 IUs daily  - Activity:             As above   - FEN/GI prophylaxis/Foley/Lines:             Reg diet             Bowel regimen    - Dispo:             Ortho issues stable             ok to dc home from our standpoint  Follow up with ortho in 10-14 days for suture removal, follow up xrays and transition to outpt PT    Mearl Latin, PA-C [phone] (C) 07/03/2022, 9:14 AM  [facility] [address] [place] [address] [phone] [phone] (F)    After 5pm and on the weekends please log on to Amion, go to orthopaedics and the look under the [facility]  Call for the provider(s) on call. You can also call our office at [phone] and then follow the prompts to be connected to the call team.  Patient ID: Tyler Hendrix, male   DOB: 28-Jul-1955, 67 y.o.   MRN: [id number]

## 2022-07-03 NOTE — Plan of Care (Signed)
  Problem: Education: Goal: Knowledge of cardiac device and self-care will improve Outcome: Progressing   Problem: Education: Goal: Knowledge of General Education information will improve Description: Including pain rating scale, medication(s)/side effects and non-pharmacologic comfort measures Outcome: Progressing   Problem: Health Behavior/Discharge Planning: Goal: Ability to manage health-related needs will improve Outcome: Progressing   Problem: Clinical Measurements: Goal: Ability to maintain clinical measurements within normal limits will improve Outcome: Progressing   Problem: Activity: Goal: Risk for activity intolerance will decrease Outcome: Progressing   Problem: Pain Managment: Goal: General experience of comfort will improve Outcome: Progressing

## 2022-07-03 NOTE — Progress Notes (Signed)
Occupational Therapy Treatment Patient Details Name: Tyler Hendrix MRN: [id number] DOB: March 30, 1955 Today's Date: 07/03/2022   History of present illness Patient is a 67 year old male presenting after trauma involving a ladder. Left femoral shaft fracture s/p IM nail. History of pacemaker placement, CAD and CABG, cardiomyopathy, lymphoma, atrial fibrillation.   OT comments  OT following up with patient while wife is present to complete family education. Educated wife on the use of gait belt and need for assist to help patient with mobility and transfers. Pt verbalized and demonstrated understanding of gait belt use and trialed STS x2 with OT, Min A +2 for STS and Mod A individually. Pt needing Min guard to take steps at bedside. OT to continue to progress patient as able and educate prn to allow for safe DC home. PT notified of patient concern to complete stairs at home.     Recommendations for follow up therapy are one component of a multi-disciplinary discharge planning process, led by the attending physician.  Recommendations may be updated based on patient status, additional functional criteria and insurance authorization.    Assistance Recommended at Discharge Frequent or constant Supervision/Assistance  Patient can return home with the following  A little help with walking and/or transfers;A lot of help with bathing/dressing/bathroom;Assistance with cooking/housework;Assist for transportation;Help with stairs or ramp for entrance   Equipment Recommendations  BSC/3in1;Other (comment) (sockaid, reacher, long handle sponge, shoe horn)    Recommendations for Other Services      Precautions / Restrictions Precautions Precautions: Fall Restrictions Weight Bearing Restrictions: Yes LLE Weight Bearing: Weight bearing as tolerated       Mobility Bed Mobility Overal bed mobility: Needs Assistance Bed Mobility: Sidelying to Sit   Sidelying to sit: Mod assist   Sit to supine: Mod  assist   General bed mobility comments: Wife assisting patient with push up upon sidelying to sit    Transfers Overall transfer level: Needs assistance Equipment used: Rolling walker (2 wheels) Transfers: Sit to/from Stand Sit to Stand: +2 physical assistance, Min assist           General transfer comment: Educated wife on hand placement for gait belt and holding RW in position     Balance Overall balance assessment: Needs assistance Sitting-balance support: Feet supported, No upper extremity supported Sitting balance-Leahy Scale: Fair     Standing balance support: During functional activity, Single extremity supported Standing balance-Leahy Scale: Poor                             ADL either performed or assessed with clinical judgement   ADL                                       Functional mobility during ADLs: Min guard;Rolling walker (2 wheels) General ADL Comments: Focused session on family education of bed mobility and need for assist with transfers    Extremity/Trunk Assessment              Vision       Perception     Praxis      Cognition Arousal/Alertness: Awake/alert Behavior During Therapy: WFL for tasks assessed/performed Overall Cognitive Status: Within Functional Limits for tasks assessed  Exercises      Shoulder Instructions       General Comments Wife present during OT session, provided wife with education on gait belt and pt's need for level of assist    Pertinent Vitals/ Pain       Pain Assessment Pain Assessment: Faces Faces Pain Scale: Hurts whole lot Pain Location: L hip with movement Pain Descriptors / Indicators: Aching, Jabbing Pain Intervention(s): Monitored during session, Repositioned  Home Living                                          Prior Functioning/Environment              Frequency  Min 2X/week         Progress Toward Goals  OT Goals(current goals can now be found in the care plan section)  Progress towards OT goals: Progressing toward goals  Acute Rehab OT Goals Patient Stated Goal: to go home OT Goal Formulation: With patient/family Time For Goal Achievement: 07/15/22 Potential to Achieve Goals: Good  Plan Discharge plan remains appropriate;Frequency remains appropriate    Co-evaluation                 AM-PAC OT "6 Clicks" Daily Activity     Outcome Measure   Help from another person eating meals?: None Help from another person taking care of personal grooming?: A Little Help from another person toileting, which includes using toliet, bedpan, or urinal?: A Little Help from another person bathing (including washing, rinsing, drying)?: A Lot Help from another person to put on and taking off regular upper body clothing?: A Little Help from another person to put on and taking off regular lower body clothing?: A Little (with AE) 6 Click Score: 18    End of Session Equipment Utilized During Treatment: Gait belt;Rolling walker (2 wheels)  OT Visit Diagnosis: Unsteadiness on feet (R26.81);Other abnormalities of gait and mobility (R26.89);Muscle weakness (generalized) (M62.81);Pain Pain - Right/Left: Left Pain - part of body: Hip   Activity Tolerance Patient tolerated treatment well   Patient Left in bed;with call bell/phone within reach;with family/visitor present (sitting EOB)   Nurse Communication Mobility status        Time: 8295-6213 OT Time Calculation (min): 20 min  Charges: OT General Charges $OT Visit: 1 Visit OT Treatments $Self Care/Home Management : 23-37 mins $Therapeutic Activity: 8-22 mins  07/03/2022  AB, OTR/L  Acute Rehabilitation Services  Office: [phone]   Tristan Schroeder 07/03/2022, 1:04 PM

## 2022-07-03 NOTE — Discharge Instructions (Signed)
Orthopaedic Trauma Service Discharge Instructions   General Discharge Instructions  Orthopaedic Injuries:  Left femur fracture treated with intramedullary nailing  WEIGHT BEARING STATUS: Weight-bear as tolerated left leg.  Use crutches or walker  RANGE OF MOTION/ACTIVITY: Unrestricted range of motion of left hip and knee.  Slowly increase activity level.  Bone health: Vitamin D levels during this hospitalization looked okay but would still recommend 5000 IUs of vitamin D3 daily  Review the following resource for additional information regarding bone health  [email or web address].cy  Wound Care: Okay to remove dressings from left leg.  Okay to shower and clean with soap and water only.  Can leave open to the air if there is no drainage otherwise cover with Mepilex dressing (silicone foam dressing).  These are the beige dressings you currently have on Discharge Wound Care Instructions  Do NOT apply any ointments, solutions or lotions to pin sites or surgical wounds.  These prevent needed drainage and even though solutions like hydrogen peroxide kill bacteria, they also damage cells lining the pin sites that help fight infection.  Applying lotions or ointments can keep the wounds moist and can cause them to breakdown and open up as well. This can increase the risk for infection. When in doubt call the office.  Surgical incisions should be dressed daily.  If any drainage is noted, use one layer of adaptic or Mepitel, then gauze and tape.  Alternatively you can use the silicone foam dressings such as those that you have on.  These are known as Mepilex dressings.  NetCamper.cz [email or web address]-soto.com/?pd_rd_[email or web address]&[email or web address]  [email or web address].com/  These dressing supplies should be  available at local medical supply stores ([employer], [facility], etc). They are not usually carried at places like [facility], [facility], [facility], etc  Once the incision is completely dry and without drainage, it may be left open to air out.  Showering may begin 36-48 hours later.  Cleaning gently with soap and water.   DVT/PE prophylaxis: Eliquis 2.5 mg by mouth every 12 hours for 21 days.  Also continue to be active and walk around is much as possible .  Diet: as you were eating previously.  Can use over the counter stool softeners and bowel preparations, such as Miralax, to help with bowel movements.  Narcotics can be constipating.  Be sure to drink plenty of fluids  PAIN MEDICATION USE AND EXPECTATIONS  You have likely been given narcotic medications to help control your pain.  After a traumatic event that results in an fracture (broken bone) with or without surgery, it is ok to use narcotic pain medications to help control one's pain.  We understand that everyone responds to pain differently and each individual patient will be evaluated on a regular basis for the continued need for narcotic medications. Ideally, narcotic medication use should last no more than 6-8 weeks (coinciding with fracture healing).   As a patient it is your responsibility as well to monitor narcotic medication use and report the amount and frequency you use these medications when you come to your office visit.   We would also advise that if you are using narcotic medications, you should take a dose prior to therapy to maximize you participation.  IF YOU ARE ON NARCOTIC MEDICATIONS IT IS NOT PERMISSIBLE TO OPERATE A MOTOR VEHICLE (MOTORCYCLE/CAR/TRUCK/MOPED) OR HEAVY MACHINERY DO NOT MIX NARCOTICS WITH OTHER CNS (CENTRAL NERVOUS SYSTEM) DEPRESSANTS SUCH AS ALCOHOL   POST-OPERATIVE OPIOID TAPER INSTRUCTIONS: It is important to wean off of your opioid medication as soon  as possible. If you do not need pain medication  after your surgery it is ok to stop day one. Opioids include: Codeine, Hydrocodone(Norco, Vicodin), Oxycodone(Percocet, oxycontin) and hydromorphone amongst others.  Long term and even short term use of opiods can cause: Increased pain response Dependence Constipation Depression Respiratory depression And more.  Withdrawal symptoms can include Flu like symptoms Nausea, vomiting And more Techniques to manage these symptoms Hydrate well Eat regular healthy meals Stay active Use relaxation techniques(deep breathing, meditating, yoga) Do Not substitute Alcohol to help with tapering If you have been on opioids for less than two weeks and do not have pain than it is ok to stop all together.  Plan to wean off of opioids This plan should start within one week post op of your fracture surgery  Maintain the same interval or time between taking each dose and first decrease the dose.  Cut the total daily intake of opioids by one tablet each day Next start to increase the time between doses. The last dose that should be eliminated is the evening dose.    STOP SMOKING OR USING NICOTINE PRODUCTS!!!!  As discussed nicotine severely impairs your body's ability to heal surgical and traumatic wounds but also impairs bone healing.  Wounds and bone heal by forming microscopic blood vessels (angiogenesis) and nicotine is a vasoconstrictor (essentially, shrinks blood vessels).  Therefore, if vasoconstriction occurs to these microscopic blood vessels they essentially disappear and are unable to deliver necessary nutrients to the healing tissue.  This is one modifiable factor that you can do to dramatically increase your chances of healing your injury.    (This means no smoking, no nicotine gum, patches, etc)  DO NOT USE NONSTEROIDAL ANTI-INFLAMMATORY DRUGS (NSAID'S)  Using products such as Advil (ibuprofen), Aleve (naproxen), Motrin (ibuprofen) for additional pain control during fracture healing can delay  and/or prevent the healing response.  If you would like to take over the counter (OTC) medication, Tylenol (acetaminophen) is ok.  However, some narcotic medications that are given for pain control contain acetaminophen as well. Therefore, you should not exceed more than 4000 mg of tylenol in a day if you do not have liver disease.  Also note that there are may OTC medicines, such as cold medicines and allergy medicines that my contain tylenol as well.  If you have any questions about medications and/or interactions please ask your doctor/PA or your pharmacist.      ICE AND ELEVATE INJURED/OPERATIVE EXTREMITY  Using ice and elevating the injured extremity above your heart can help with swelling and pain control.  Icing in a pulsatile fashion, such as 20 minutes on and 20 minutes off, can be followed.    Do not place ice directly on skin. Make sure there is a barrier between to skin and the ice pack.    Using frozen items such as frozen peas works well as the conform nicely to the are that needs to be iced.  USE AN ACE WRAP OR TED HOSE FOR SWELLING CONTROL  In addition to icing and elevation, Ace wraps or TED hose are used to help limit and resolve swelling.  It is recommended to use Ace wraps or TED hose until you are informed to stop.    When using Ace Wraps start the wrapping distally (farthest away from the body) and wrap proximally (closer to the body)   Example: If you had surgery on your leg or thing and you do not have a splint on, start the ace  wrap at the toes and work your way up to the thigh        If you had surgery on your upper extremity and do not have a splint on, start the ace wrap at your fingers and work your way up to the upper arm  IF YOU ARE IN A SPLINT OR CAST DO NOT REMOVE IT FOR ANY REASON   If your splint gets wet for any reason please contact the office immediately. You may shower in your splint or cast as long as you keep it dry.  This can be done by wrapping in a cast  cover or garbage back (or similar)  Do Not stick any thing down your splint or cast such as pencils, money, or hangers to try and scratch yourself with.  If you feel itchy take benadryl as prescribed on the bottle for itching  IF YOU ARE IN A CAM BOOT (BLACK BOOT)  You may remove boot periodically. Perform daily dressing changes as noted below.  Wash the liner of the boot regularly and wear a sock when wearing the boot. It is recommended that you sleep in the boot until told otherwise    Call office for the following: Temperature greater than 101F Persistent nausea and vomiting Severe uncontrolled pain Redness, tenderness, or signs of infection (pain, swelling, redness, odor or green/yellow discharge around the site) Difficulty breathing, headache or visual disturbances Hives Persistent dizziness or light-headedness Extreme fatigue Any other questions or concerns you may have after discharge  In an emergency, call 911 or go to an Emergency Department at a nearby hospital  HELPFUL INFORMATION  If you had a block, it will wear off between 8-24 hrs postop typically.  This is period when your pain may go from nearly zero to the pain you would have had postop without the block.  This is an abrupt transition but nothing dangerous is happening.  You may take an extra dose of narcotic when this happens.  You should wean off your narcotic medicines as soon as you are able.  Most patients will be off or using minimal narcotics before their first postop appointment.   We suggest you use the pain medication the first night prior to going to bed, in order to ease any pain when the anesthesia wears off. You should avoid taking pain medications on an empty stomach as it will make you nauseous.  Do not drink alcoholic beverages or take illicit drugs when taking pain medications.  In most states it is against the law to drive while you are in a splint or sling.  And certainly against the law to drive  while taking narcotics.  You may return to work/school in the next couple of days when you feel up to it.   Pain medication may make you constipated.  Below are a few solutions to try in this order: Decrease the amount of pain medication if you aren't having pain. Drink lots of decaffeinated fluids. Drink prune juice and/or each dried prunes  If the first 3 don't work start with additional solutions Take Colace - an over-the-counter stool softener Take Senokot - an over-the-counter laxative Take Miralax - a stronger over-the-counter laxative     CALL THE OFFICE WITH ANY QUESTIONS OR CONCERNS: [phone]   VISIT OUR WEBSITE FOR ADDITIONAL INFORMATION: [email or web address]

## 2022-07-04 ENCOUNTER — Other Ambulatory Visit: Payer: Self-pay

## 2022-07-04 DIAGNOSIS — I4891 Unspecified atrial fibrillation: Secondary | ICD-10-CM | POA: Diagnosis not present

## 2022-07-04 DIAGNOSIS — I5022 Chronic systolic (congestive) heart failure: Secondary | ICD-10-CM | POA: Diagnosis not present

## 2022-07-04 DIAGNOSIS — I110 Hypertensive heart disease with heart failure: Secondary | ICD-10-CM | POA: Diagnosis not present

## 2022-07-04 DIAGNOSIS — S7292XD Unspecified fracture of left femur, subsequent encounter for closed fracture with routine healing: Secondary | ICD-10-CM | POA: Diagnosis not present

## 2022-07-04 DIAGNOSIS — I2510 Atherosclerotic heart disease of native coronary artery without angina pectoris: Secondary | ICD-10-CM | POA: Diagnosis not present

## 2022-07-08 ENCOUNTER — Telehealth: Payer: Self-pay

## 2022-07-08 DIAGNOSIS — I2510 Atherosclerotic heart disease of native coronary artery without angina pectoris: Secondary | ICD-10-CM | POA: Diagnosis not present

## 2022-07-08 DIAGNOSIS — I5022 Chronic systolic (congestive) heart failure: Secondary | ICD-10-CM | POA: Diagnosis not present

## 2022-07-08 DIAGNOSIS — I110 Hypertensive heart disease with heart failure: Secondary | ICD-10-CM | POA: Diagnosis not present

## 2022-07-08 DIAGNOSIS — S7292XD Unspecified fracture of left femur, subsequent encounter for closed fracture with routine healing: Secondary | ICD-10-CM | POA: Diagnosis not present

## 2022-07-08 DIAGNOSIS — I4891 Unspecified atrial fibrillation: Secondary | ICD-10-CM | POA: Diagnosis not present

## 2022-07-08 NOTE — Transitions of Care (Post Inpatient/ED Visit) (Signed)
07/08/2022  Name: Tyler Hendrix MRN: [id number] DOB: November 19, 1955  Today's TOC FU Call Status: Today's TOC FU Call Status:: Successful TOC FU Call Competed TOC FU Call Complete Date: 07/08/22  Transition Care Management Follow-up Telephone Call Date of Discharge: 07/03/22 Discharge Facility: [facility]) Type of Discharge: Inpatient Admission Primary Inpatient Discharge Diagnosis:: fracture fermur Reason for ED Visit: Other:, Orthopedic Conditions Orthopedic/Injury Diagnosis: Fracture (femur) How have you been since you were released from the hospital?: Same Any questions or concerns?: No  Items Reviewed: Did you receive and understand the discharge instructions provided?: Yes Any new allergies since your discharge?: No Dietary orders reviewed?: No Do you have support at home?: Yes People in Home: spouse  Medications Reviewed Today: Medications Reviewed Today     Reviewed by Annabell Sabal, CMA (Certified Medical Assistant) on 07/08/22 at 1140  Med List Status: <None>   Medication Order Taking? Sig Documenting Provider Last Dose Status Informant  apixaban (ELIQUIS) 2.5 MG TABS tablet [id number] Yes Take 1 tablet (2.5 mg total) by mouth 2 (two) times daily for 21 days. Montez Morita, PA-C Taking Active   aspirin EC 81 MG tablet [id number] Yes Take 81 mg by mouth daily. [provider] Taking Active Self  atorvastatin (LIPITOR) 40 MG tablet [id number] Yes Take 1 tablet by mouth daily [name], MD Taking Active Self  carvedilol (COREG) 6.25 MG tablet [id number] Yes Take 1 tablet (6.25 mg total) by mouth 2 (two) times daily with a meal. [name], MD Taking Active Self  Cholecalciferol 125 MCG (5000 UT) TABS [id number] Yes Take 1 tablet by mouth daily. Montez Morita, PA-C Taking Active   docusate sodium (COLACE) 100 MG capsule [id number] Yes Take 1 capsule (100 mg total) by mouth 2 (two) times daily. Pokhrel, Laxman, MD Taking Active   levothyroxine (SYNTHROID) 75  MCG tablet [id number] Yes TAKE 1 TABLET BY MOUTH DAILY BEFORE BREAKFAST. Carlus Pavlov, MD Taking Active Self  methocarbamol (ROBAXIN) 500 MG tablet [id number] Yes Take 1-2 tablets (500-1,000 mg total) by mouth every 6 (six) hours as needed for muscle spasms. Montez Morita, PA-C Taking Active   Multiple Vitamin (MULTIVITAMIN WITH MINERALS) TABS tablet [id number] Yes Take 1 tablet by mouth daily. [provider] Taking Active Self  oxyCODONE-acetaminophen (PERCOCET) 5-325 MG tablet [id number] Yes Take 1 tablet by mouth every 4 (four) hours as needed for severe pain. Montez Morita, PA-C Taking Active   sacubitril-valsartan (ENTRESTO) 24-26 MG [id number] Yes Take 1 tablet by mouth 2 (two) times daily. Chrystie Nose, MD Taking Active Self  sildenafil (VIAGRA) 50 MG tablet [id number] Yes Take 1 tablet (50 mg total) by mouth daily as needed for erectile dysfunction. [name], MD Taking Active Self  spironolactone (ALDACTONE) 25 MG tablet [id number] Yes TAKE 1/2 TABLET BY MOUTH ONCE A DAY [name], MD Taking Active Self  traZODone (DESYREL) 50 MG tablet [id number] Yes Take 1 tablet (50 mg total) by mouth at bedtime.  Patient taking differently: Take 100 mg by mouth at bedtime.   [name], MD Taking Active Self            Home Care and Equipment/Supplies: Were Home Health Services Ordered?: Yes Name of [facility] Health Agency[facility] Has Agency set up a time to come to your home?: Yes First Home Health Visit Date: 07/08/22 Any new equipment or medical supplies ordered?: No  Functional Questionnaire: Do you need assistance with bathing/showering or dressing?: No Do you need assistance with meal  preparation?: No Do you need assistance with eating?: No Do you have difficulty maintaining continence: No Do you need assistance with getting out of bed/getting out of a chair/moving?: No Do you have difficulty managing or taking your medications?: No  Follow up appointments  reviewed: PCP Follow-up appointment confirmed?: No MD Provider Line Number:[phone] Given: No Date of PCP follow-up appointment?:  (patient declined at this time) Specialist Hospital Follow-up appointment confirmed?: No Reason Specialist Follow-Up Not Confirmed: Patient has Specialist Provider Number and will Call for Appointment Do you need transportation to your follow-up appointment?: No Do you understand care options if your condition(s) worsen?: Yes-patient verbalized understanding    SIGNATURE Museum/gallery exhibitions officer- [employer]

## 2022-07-10 ENCOUNTER — Telehealth: Payer: Self-pay | Admitting: Internal Medicine

## 2022-07-10 ENCOUNTER — Other Ambulatory Visit (HOSPITAL_COMMUNITY): Payer: Self-pay

## 2022-07-10 DIAGNOSIS — I5022 Chronic systolic (congestive) heart failure: Secondary | ICD-10-CM | POA: Diagnosis not present

## 2022-07-10 DIAGNOSIS — I2510 Atherosclerotic heart disease of native coronary artery without angina pectoris: Secondary | ICD-10-CM | POA: Diagnosis not present

## 2022-07-10 DIAGNOSIS — I4891 Unspecified atrial fibrillation: Secondary | ICD-10-CM | POA: Diagnosis not present

## 2022-07-10 DIAGNOSIS — I110 Hypertensive heart disease with heart failure: Secondary | ICD-10-CM | POA: Diagnosis not present

## 2022-07-10 DIAGNOSIS — S7292XD Unspecified fracture of left femur, subsequent encounter for closed fracture with routine healing: Secondary | ICD-10-CM | POA: Diagnosis not present

## 2022-07-10 NOTE — Telephone Encounter (Signed)
Pt would like a callback regarding cancelling upcoming procedure on 6/10 due to recently being discharged from the hospital and now having to use a walker. Please advise

## 2022-07-14 NOTE — Telephone Encounter (Signed)
Per Dr Ladona Ridgel, ok to reschedule patient. He has an appointment on Wednesday for a f/u with the surgeon and will call me after that to give me an update of when he may be able to reschedule.

## 2022-07-14 NOTE — Telephone Encounter (Signed)
Pt would like to post pone this procedure due to him having to use a walker. He broke his femur and had surgery.  Message sent to GT for his input. Pt is aware that I will not cancel procedure until I hear back from GT. I will call pt with his recommendations.

## 2022-07-15 ENCOUNTER — Other Ambulatory Visit (HOSPITAL_COMMUNITY): Payer: Self-pay

## 2022-07-15 DIAGNOSIS — I110 Hypertensive heart disease with heart failure: Secondary | ICD-10-CM | POA: Diagnosis not present

## 2022-07-15 DIAGNOSIS — I4891 Unspecified atrial fibrillation: Secondary | ICD-10-CM | POA: Diagnosis not present

## 2022-07-15 DIAGNOSIS — I5022 Chronic systolic (congestive) heart failure: Secondary | ICD-10-CM | POA: Diagnosis not present

## 2022-07-15 DIAGNOSIS — S7292XD Unspecified fracture of left femur, subsequent encounter for closed fracture with routine healing: Secondary | ICD-10-CM | POA: Diagnosis not present

## 2022-07-15 DIAGNOSIS — I2510 Atherosclerotic heart disease of native coronary artery without angina pectoris: Secondary | ICD-10-CM | POA: Diagnosis not present

## 2022-07-15 MED ORDER — OXYCODONE-ACETAMINOPHEN 5-325 MG PO TABS
1.0000 | ORAL_TABLET | Freq: Three times a day (TID) | ORAL | 0 refills | Status: DC | PRN
  Filled 2022-07-15: qty 40, 7d supply, fill #0

## 2022-07-17 ENCOUNTER — Telehealth: Payer: Self-pay | Admitting: Internal Medicine

## 2022-07-17 DIAGNOSIS — S7292XD Unspecified fracture of left femur, subsequent encounter for closed fracture with routine healing: Secondary | ICD-10-CM | POA: Diagnosis not present

## 2022-07-17 DIAGNOSIS — I5022 Chronic systolic (congestive) heart failure: Secondary | ICD-10-CM | POA: Diagnosis not present

## 2022-07-17 DIAGNOSIS — I110 Hypertensive heart disease with heart failure: Secondary | ICD-10-CM | POA: Diagnosis not present

## 2022-07-17 DIAGNOSIS — I4891 Unspecified atrial fibrillation: Secondary | ICD-10-CM | POA: Diagnosis not present

## 2022-07-17 DIAGNOSIS — I2510 Atherosclerotic heart disease of native coronary artery without angina pectoris: Secondary | ICD-10-CM | POA: Diagnosis not present

## 2022-07-17 DIAGNOSIS — Z45018 Encounter for adjustment and management of other part of cardiac pacemaker: Secondary | ICD-10-CM

## 2022-07-17 DIAGNOSIS — I442 Atrioventricular block, complete: Secondary | ICD-10-CM

## 2022-07-17 NOTE — Telephone Encounter (Signed)
Wife canceled Wound Check visit on 6/20.

## 2022-07-21 ENCOUNTER — Other Ambulatory Visit (HOSPITAL_COMMUNITY): Payer: Self-pay

## 2022-07-21 ENCOUNTER — Other Ambulatory Visit: Payer: Self-pay | Admitting: Family Medicine

## 2022-07-21 ENCOUNTER — Other Ambulatory Visit: Payer: Self-pay

## 2022-07-21 ENCOUNTER — Encounter (HOSPITAL_COMMUNITY): Admission: RE | Payer: Self-pay | Source: Home / Self Care

## 2022-07-21 ENCOUNTER — Ambulatory Visit (HOSPITAL_COMMUNITY): Admission: RE | Admit: 2022-07-21 | Payer: 59 | Source: Home / Self Care | Admitting: Internal Medicine

## 2022-07-21 ENCOUNTER — Other Ambulatory Visit: Payer: Self-pay | Admitting: Internal Medicine

## 2022-07-21 DIAGNOSIS — I110 Hypertensive heart disease with heart failure: Secondary | ICD-10-CM | POA: Diagnosis not present

## 2022-07-21 DIAGNOSIS — S7292XD Unspecified fracture of left femur, subsequent encounter for closed fracture with routine healing: Secondary | ICD-10-CM | POA: Diagnosis not present

## 2022-07-21 DIAGNOSIS — T82111A Breakdown (mechanical) of cardiac pulse generator (battery), initial encounter: Secondary | ICD-10-CM

## 2022-07-21 DIAGNOSIS — I5022 Chronic systolic (congestive) heart failure: Secondary | ICD-10-CM | POA: Diagnosis not present

## 2022-07-21 DIAGNOSIS — I2510 Atherosclerotic heart disease of native coronary artery without angina pectoris: Secondary | ICD-10-CM | POA: Diagnosis not present

## 2022-07-21 DIAGNOSIS — I4891 Unspecified atrial fibrillation: Secondary | ICD-10-CM | POA: Diagnosis not present

## 2022-07-21 SURGERY — LEAD EXTRACTION
Anesthesia: General

## 2022-07-21 MED ORDER — TRAZODONE HCL 50 MG PO TABS
50.0000 mg | ORAL_TABLET | Freq: Every day | ORAL | 3 refills | Status: DC
  Filled 2022-07-21: qty 90, 90d supply, fill #0
  Filled 2022-10-22: qty 90, 90d supply, fill #1

## 2022-07-21 MED ORDER — SPIRONOLACTONE 25 MG PO TABS
12.5000 mg | ORAL_TABLET | Freq: Every day | ORAL | 3 refills | Status: DC
  Filled 2022-07-21: qty 45, 90d supply, fill #0
  Filled 2022-10-14: qty 45, 90d supply, fill #1
  Filled 2022-12-28 – 2023-02-12 (×2): qty 45, 90d supply, fill #2
  Filled 2023-05-11: qty 45, 90d supply, fill #3

## 2022-07-21 MED ORDER — LEVOTHYROXINE SODIUM 75 MCG PO TABS
75.0000 ug | ORAL_TABLET | Freq: Every day | ORAL | 0 refills | Status: DC
  Filled 2022-07-21: qty 30, 30d supply, fill #0

## 2022-07-24 DIAGNOSIS — S7292XD Unspecified fracture of left femur, subsequent encounter for closed fracture with routine healing: Secondary | ICD-10-CM | POA: Diagnosis not present

## 2022-07-24 DIAGNOSIS — I110 Hypertensive heart disease with heart failure: Secondary | ICD-10-CM | POA: Diagnosis not present

## 2022-07-24 DIAGNOSIS — I5022 Chronic systolic (congestive) heart failure: Secondary | ICD-10-CM | POA: Diagnosis not present

## 2022-07-24 DIAGNOSIS — I4891 Unspecified atrial fibrillation: Secondary | ICD-10-CM | POA: Diagnosis not present

## 2022-07-24 DIAGNOSIS — I2510 Atherosclerotic heart disease of native coronary artery without angina pectoris: Secondary | ICD-10-CM | POA: Diagnosis not present

## 2022-07-28 DIAGNOSIS — I2510 Atherosclerotic heart disease of native coronary artery without angina pectoris: Secondary | ICD-10-CM | POA: Diagnosis not present

## 2022-07-28 DIAGNOSIS — I5022 Chronic systolic (congestive) heart failure: Secondary | ICD-10-CM | POA: Diagnosis not present

## 2022-07-28 DIAGNOSIS — I110 Hypertensive heart disease with heart failure: Secondary | ICD-10-CM | POA: Diagnosis not present

## 2022-07-28 DIAGNOSIS — S7292XD Unspecified fracture of left femur, subsequent encounter for closed fracture with routine healing: Secondary | ICD-10-CM | POA: Diagnosis not present

## 2022-07-28 DIAGNOSIS — I4891 Unspecified atrial fibrillation: Secondary | ICD-10-CM | POA: Diagnosis not present

## 2022-07-30 ENCOUNTER — Other Ambulatory Visit (HOSPITAL_COMMUNITY): Payer: Self-pay

## 2022-07-30 DIAGNOSIS — I4891 Unspecified atrial fibrillation: Secondary | ICD-10-CM | POA: Diagnosis not present

## 2022-07-30 DIAGNOSIS — I110 Hypertensive heart disease with heart failure: Secondary | ICD-10-CM | POA: Diagnosis not present

## 2022-07-30 DIAGNOSIS — I5022 Chronic systolic (congestive) heart failure: Secondary | ICD-10-CM | POA: Diagnosis not present

## 2022-07-30 DIAGNOSIS — I2510 Atherosclerotic heart disease of native coronary artery without angina pectoris: Secondary | ICD-10-CM | POA: Diagnosis not present

## 2022-07-30 DIAGNOSIS — S7292XD Unspecified fracture of left femur, subsequent encounter for closed fracture with routine healing: Secondary | ICD-10-CM | POA: Diagnosis not present

## 2022-07-30 MED ORDER — OXYCODONE-ACETAMINOPHEN 5-325 MG PO TABS
1.0000 | ORAL_TABLET | Freq: Three times a day (TID) | ORAL | 0 refills | Status: DC | PRN
  Filled 2022-07-30: qty 40, 7d supply, fill #0

## 2022-07-31 ENCOUNTER — Other Ambulatory Visit (HOSPITAL_COMMUNITY): Payer: Self-pay

## 2022-07-31 ENCOUNTER — Ambulatory Visit: Payer: 59

## 2022-08-01 ENCOUNTER — Other Ambulatory Visit (HOSPITAL_COMMUNITY): Payer: Self-pay

## 2022-08-12 ENCOUNTER — Other Ambulatory Visit (HOSPITAL_COMMUNITY): Payer: Self-pay

## 2022-08-13 DIAGNOSIS — S72302D Unspecified fracture of shaft of left femur, subsequent encounter for closed fracture with routine healing: Secondary | ICD-10-CM | POA: Diagnosis not present

## 2022-08-14 DIAGNOSIS — I4891 Unspecified atrial fibrillation: Secondary | ICD-10-CM | POA: Diagnosis not present

## 2022-08-14 DIAGNOSIS — S7292XD Unspecified fracture of left femur, subsequent encounter for closed fracture with routine healing: Secondary | ICD-10-CM | POA: Diagnosis not present

## 2022-08-14 DIAGNOSIS — I2510 Atherosclerotic heart disease of native coronary artery without angina pectoris: Secondary | ICD-10-CM | POA: Diagnosis not present

## 2022-08-14 DIAGNOSIS — I5022 Chronic systolic (congestive) heart failure: Secondary | ICD-10-CM | POA: Diagnosis not present

## 2022-08-14 DIAGNOSIS — I110 Hypertensive heart disease with heart failure: Secondary | ICD-10-CM | POA: Diagnosis not present

## 2022-08-19 ENCOUNTER — Telehealth: Payer: Self-pay | Admitting: Internal Medicine

## 2022-08-19 ENCOUNTER — Ambulatory Visit (INDEPENDENT_AMBULATORY_CARE_PROVIDER_SITE_OTHER): Payer: 59 | Admitting: Family Medicine

## 2022-08-19 ENCOUNTER — Encounter: Payer: 59 | Admitting: Family Medicine

## 2022-08-19 ENCOUNTER — Encounter: Payer: Self-pay | Admitting: Family Medicine

## 2022-08-19 ENCOUNTER — Other Ambulatory Visit (HOSPITAL_COMMUNITY): Payer: Self-pay

## 2022-08-19 ENCOUNTER — Other Ambulatory Visit: Payer: Self-pay

## 2022-08-19 VITALS — BP 118/76 | HR 75 | Temp 98.2°F | Ht 71.0 in | Wt 176.2 lb

## 2022-08-19 DIAGNOSIS — Z0000 Encounter for general adult medical examination without abnormal findings: Secondary | ICD-10-CM

## 2022-08-19 DIAGNOSIS — E039 Hypothyroidism, unspecified: Secondary | ICD-10-CM | POA: Diagnosis not present

## 2022-08-19 DIAGNOSIS — R972 Elevated prostate specific antigen [PSA]: Secondary | ICD-10-CM

## 2022-08-19 DIAGNOSIS — K4000 Bilateral inguinal hernia, with obstruction, without gangrene, not specified as recurrent: Secondary | ICD-10-CM | POA: Diagnosis not present

## 2022-08-19 DIAGNOSIS — E782 Mixed hyperlipidemia: Secondary | ICD-10-CM

## 2022-08-19 LAB — T4, FREE: Free T4: 0.87 ng/dL (ref 0.60–1.60)

## 2022-08-19 LAB — CBC WITH DIFFERENTIAL/PLATELET
Basophils Absolute: 0.1 10*3/uL (ref 0.0–0.1)
Basophils Relative: 0.7 % (ref 0.0–3.0)
Eosinophils Absolute: 0.2 10*3/uL (ref 0.0–0.7)
Eosinophils Relative: 2.2 % (ref 0.0–5.0)
HCT: 40.1 % (ref 39.0–52.0)
Hemoglobin: 13.5 g/dL (ref 13.0–17.0)
Lymphocytes Relative: 11.9 % — ABNORMAL LOW (ref 12.0–46.0)
Lymphs Abs: 1 10*3/uL (ref 0.7–4.0)
MCHC: 33.6 g/dL (ref 30.0–36.0)
MCV: 94 fl (ref 78.0–100.0)
Monocytes Absolute: 0.4 10*3/uL (ref 0.1–1.0)
Monocytes Relative: 5.2 % (ref 3.0–12.0)
Neutro Abs: 6.7 10*3/uL (ref 1.4–7.7)
Neutrophils Relative %: 80 % — ABNORMAL HIGH (ref 43.0–77.0)
Platelets: 317 10*3/uL (ref 150.0–400.0)
RBC: 4.26 Mil/uL (ref 4.22–5.81)
RDW: 13.7 % (ref 11.5–15.5)
WBC: 8.4 10*3/uL (ref 4.0–10.5)

## 2022-08-19 LAB — BASIC METABOLIC PANEL
BUN: 16 mg/dL (ref 6–23)
CO2: 30 mEq/L (ref 19–32)
Calcium: 10.2 mg/dL (ref 8.4–10.5)
Chloride: 103 mEq/L (ref 96–112)
Creatinine, Ser: 1.09 mg/dL (ref 0.40–1.50)
GFR: 70.82 mL/min (ref >60.00)
Glucose, Bld: 106 mg/dL — ABNORMAL HIGH (ref 70–99)
Potassium: 4.9 mEq/L (ref 3.5–5.1)
Sodium: 140 mEq/L (ref 135–145)

## 2022-08-19 LAB — LIPID PANEL
Cholesterol: 113 mg/dL (ref 0–200)
HDL: 36.3 mg/dL — ABNORMAL LOW (ref >39.00)
LDL Cholesterol: 51 mg/dL (ref 0–99)
NonHDL: 76.79
Total CHOL/HDL Ratio: 3
Triglycerides: 130 mg/dL (ref 0.0–149.0)
VLDL: 26 mg/dL (ref 0.0–40.0)

## 2022-08-19 LAB — HEPATIC FUNCTION PANEL
ALT: 13 U/L (ref 0–53)
AST: 16 U/L (ref 0–37)
Albumin: 4.6 g/dL (ref 3.5–5.2)
Alkaline Phosphatase: 142 U/L — ABNORMAL HIGH (ref 39–117)
Bilirubin, Direct: 0.1 mg/dL (ref 0.0–0.3)
Total Bilirubin: 0.6 mg/dL (ref 0.2–1.2)
Total Protein: 7.1 g/dL (ref 6.0–8.3)

## 2022-08-19 LAB — TSH: TSH: 4.18 u[IU]/mL (ref 0.35–5.50)

## 2022-08-19 LAB — T3, FREE: T3, Free: 3.3 pg/mL (ref 2.3–4.2)

## 2022-08-19 LAB — HEMOGLOBIN A1C: Hgb A1c MFr Bld: 5 % (ref 4.6–6.5)

## 2022-08-19 LAB — PSA: PSA: 4.71 ng/mL — ABNORMAL HIGH (ref 0.10–4.00)

## 2022-08-19 MED ORDER — CARVEDILOL 6.25 MG PO TABS
6.2500 mg | ORAL_TABLET | Freq: Two times a day (BID) | ORAL | 3 refills | Status: DC
  Filled 2022-08-19: qty 180, 90d supply, fill #0
  Filled 2022-11-12: qty 180, 90d supply, fill #1
  Filled 2022-12-28 – 2023-02-10 (×2): qty 180, 90d supply, fill #2
  Filled 2023-05-11: qty 180, 90d supply, fill #3

## 2022-08-19 MED ORDER — LEVOTHYROXINE SODIUM 75 MCG PO TABS
75.0000 ug | ORAL_TABLET | Freq: Every day | ORAL | 0 refills | Status: DC
  Filled 2022-08-19: qty 30, 30d supply, fill #0

## 2022-08-19 MED ORDER — ATORVASTATIN CALCIUM 40 MG PO TABS
40.0000 mg | ORAL_TABLET | Freq: Every day | ORAL | 3 refills | Status: DC
Start: 2022-08-19 — End: 2023-08-22
  Filled 2022-08-19: qty 90, 90d supply, fill #0
  Filled 2022-11-12: qty 90, 90d supply, fill #1
  Filled 2022-12-28 – 2023-02-10 (×2): qty 90, 90d supply, fill #2
  Filled 2023-05-11: qty 90, 90d supply, fill #3

## 2022-08-19 NOTE — Telephone Encounter (Signed)
Levothyroxine sent to [facility]

## 2022-08-19 NOTE — Progress Notes (Signed)
Subjective:    Patient ID: Tyler Hendrix, male    DOB: Feb 21, 1955, 67 y.o.   MRN: [id number]  HPI Here for a well exam. He has had several issues to deal with lately. Last month while he was cutting up a tree that fell in his yard, he sustained a closed fracture of the left femur. On 06-19-22 he had surgery per Dr. Myrene Galas to place an intramedullary nail to repair this. The surgery went well, and he is almost finished with PT. He still has some pain, but he gets around without limitations using a cane. He is also scheduledto have the leads replaced on his biventricular pacer per Dr. Sharrell Ku on 10-07-22. His only complaint today is he noticed 2 bulges appear in his groin areas a few months ago. They do not bother him in any way. He sees Dr. Elvera Lennox yearly for his hypothyroidism, and he is a little past due for an appt.    Review of Systems  Constitutional: Negative.   HENT: Negative.    Eyes: Negative.   Respiratory: Negative.    Cardiovascular: Negative.   Gastrointestinal: Negative.   Genitourinary: Negative.   Musculoskeletal:  Positive for arthralgias.  Skin: Negative.   Neurological: Negative.   Psychiatric/Behavioral: Negative.         Objective:   Physical Exam Constitutional:      General: He is not in acute distress.    Appearance: Normal appearance. He is well-developed. He is not diaphoretic.     Comments: Walks with a cane  HENT:     Head: Normocephalic and atraumatic.     Right Ear: External ear normal.     Left Ear: External ear normal.     Nose: Nose normal.     Mouth/Throat:     Pharynx: No oropharyngeal exudate.  Eyes:     General: No scleral icterus.       Right eye: No discharge.        Left eye: No discharge.     Conjunctiva/sclera: Conjunctivae normal.     Pupils: Pupils are equal, round, and reactive to light.  Neck:     Thyroid: No thyromegaly.     Vascular: No JVD.     Trachea: No tracheal deviation.  Cardiovascular:     Rate and  Rhythm: Normal rate and regular rhythm.     Pulses: Normal pulses.     Heart sounds: Normal heart sounds. No murmur heard.    No friction rub. No gallop.  Pulmonary:     Effort: Pulmonary effort is normal. No respiratory distress.     Breath sounds: Normal breath sounds. No wheezing or rales.  Chest:     Chest wall: No tenderness.  Abdominal:     General: Bowel sounds are normal. There is no distension.     Palpations: Abdomen is soft. There is no mass.     Tenderness: There is no abdominal tenderness. There is no guarding or rebound.     Comments: He has bilateral moderate sized inguinal hernias which are not tender but which are not reducible   Genitourinary:    Penis: Normal. No tenderness.      Testes: Normal.     Prostate: Normal.     Rectum: Normal. Guaiac result negative.  Musculoskeletal:        General: No tenderness. Normal range of motion.     Cervical back: Neck supple.  Lymphadenopathy:     Cervical: No cervical adenopathy.  Skin:  General: Skin is warm and dry.     Coloration: Skin is not pale.     Findings: No erythema or rash.  Neurological:     General: No focal deficit present.     Mental Status: He is alert and oriented to person, place, and time.     Cranial Nerves: No cranial nerve deficit.     Motor: No abnormal muscle tone.     Coordination: Coordination normal.     Deep Tendon Reflexes: Reflexes are normal and symmetric. Reflexes normal.  Psychiatric:        Mood and Affect: Mood normal.        Behavior: Behavior normal.        Thought Content: Thought content normal.        Judgment: Judgment normal.           Assessment & Plan:  Well exam. We discussed diet and exercise. Get fasting labs. We will go ahead and check thyroid levels, but he needs to see Dr. Elvera Lennox sometime soon. He also has bilateral inguinal hernias, so we will refer hm to Surgery to evaluate these.  Gershon Crane, MD

## 2022-08-19 NOTE — Telephone Encounter (Signed)
Patient advising he need a refill on his Levothyroxine 75mg  call in to [facility]

## 2022-08-20 DIAGNOSIS — I4891 Unspecified atrial fibrillation: Secondary | ICD-10-CM | POA: Diagnosis not present

## 2022-08-20 DIAGNOSIS — I110 Hypertensive heart disease with heart failure: Secondary | ICD-10-CM | POA: Diagnosis not present

## 2022-08-20 DIAGNOSIS — I5022 Chronic systolic (congestive) heart failure: Secondary | ICD-10-CM | POA: Diagnosis not present

## 2022-08-20 DIAGNOSIS — I2510 Atherosclerotic heart disease of native coronary artery without angina pectoris: Secondary | ICD-10-CM | POA: Diagnosis not present

## 2022-08-20 DIAGNOSIS — S7292XD Unspecified fracture of left femur, subsequent encounter for closed fracture with routine healing: Secondary | ICD-10-CM | POA: Diagnosis not present

## 2022-08-20 NOTE — Addendum Note (Signed)
Addended by: Gershon Crane A on: 08/20/2022 01:11 PM   Modules accepted: Orders

## 2022-09-01 NOTE — Telephone Encounter (Signed)
Pt is requesting a callback regarding his 6/10 Lead Extraction Surgery being canceled. He stated he spoke with someone and doesn't recall who but they told him his new surgery date would be 8/27. He's not seeing it scheduled on his MyChart so now he's wanting to know if the surgery is going forward on that day due to him still having the f/u scheduled from 9/12. Please advise.

## 2022-09-01 NOTE — Telephone Encounter (Signed)
I spoke with pt and he is rescheduled for 10/20/22 at 10:30 AM. He is aware that if we don't get a 7:30 AM case his will be moved up.   He will have labs done on 10/14/22..  Instruction letters will be mailed to pt per his request.

## 2022-09-11 ENCOUNTER — Telehealth: Payer: Self-pay | Admitting: *Deleted

## 2022-09-11 ENCOUNTER — Ambulatory Visit: Payer: Self-pay | Admitting: Surgery

## 2022-09-11 DIAGNOSIS — K4020 Bilateral inguinal hernia, without obstruction or gangrene, not specified as recurrent: Secondary | ICD-10-CM | POA: Diagnosis not present

## 2022-09-11 NOTE — Telephone Encounter (Signed)
Name: Tyler Hendrix  DOB: 1955-04-28  MRN: [id number]  Primary Cardiologist: Chrystie Nose, MD   Preoperative team, please contact this patient and set up a phone call appointment for further preoperative risk assessment. Please obtain consent and complete medication review. Thank you for your help.  Patient is scheduled for a lead revision for his device. Please schedule virtual visit for a couple of weeks after his surgery date (9/9) to ensure he can be properly assessed.   I confirm that guidance regarding antiplatelet and oral anticoagulation therapy has been completed and, if necessary, noted below.  Ideally aspirin should be continued without interruption, however if the bleeding risk is too great, aspirin may be held for 5-7 days prior to surgery. Please resume aspirin post operatively when it is felt to be safe from a bleeding standpoint.     Carlos Levering, NP 09/11/2022, 4:58 PM [facility]

## 2022-09-11 NOTE — Telephone Encounter (Signed)
Pre-operative Risk Assessment    Patient Name: Tyler Hendrix  DOB: January 11, 1956 MRN: [id number]      Request for Surgical Clearance    Procedure:   HERNIA SURGERY  Date of Surgery:  Clearance TBD (DOES SAY OCTOBER / NOVEMBER)                                Surgeon:  Twana First, MD Surgeon's Group or Practice Name:  [facility] Phone number:  [phone] Fax number:  [phone]   Type of Clearance Requested:   - Pharmacy:  Hold Aspirin NOT INDICATED HOW LONG   Type of Anesthesia:  General    Additional requests/questions:    Wilhemina Cash   09/11/2022, 12:25 PM

## 2022-09-11 NOTE — H&P (Signed)
Tyler Hendrix [id number]   Referring Provider:  Dwaine Deter, MD   Subjective   Chief Complaint: New Consultation (bilateral inguinal hernia)     History of Present Illness:    This is a very pleasant 67 year old male with history of CAD status post CABG x 2, mitral valve replacement, A-fib/pleat heart block status post pacemaker subsequently upgraded to BiV, hypothyroidism, Hodgkin's disease status post radiation and chemo (one of the great sources for his cardiac issues now), chronic combined systolic and diastolic congestive heart failure (EF 30-35% Sept 2023), and arthritis presents for evaluation of bilateral inguinal hernias.  No previous abdominal surgery, but he did just have a left femoral IM nail for femur fracture sustained while doing yard work in the last few months).  He has upcoming lead replacement of his BiV pacer at the end of this month.  He noted bulges in bilateral groins a few months ago, these are asymptomatic at this point.  He is an Pensions consultant.  He is fairly active, now especially with the physical therapy for his leg.  He does note that he lost about 14 pounds and had appetite issues after the femur fracture, which seems to be getting better.  Review of Systems: A complete review of systems was obtained from the patient.  I have reviewed this information and discussed as appropriate with the patient.  See HPI as well for other ROS.   Medical History: Past Medical History:  Diagnosis Date   Arrhythmia    Arthritis    CHF (congestive heart failure) (CMS/HHS-HCC)    Heart valve disease    History of cancer    Hypertension    Thyroid disease     There is no problem list on file for this patient.   Past Surgical History:  Procedure Laterality Date   A-flutter ablation     back surgery     Cardiac catheterization     Cardioversion     Coronary artery bypass graft surgery     pacemaker insertion surgery     REPLACEMENT MITRAL VALVE     right knee  surgery       No Known Allergies  Current Outpatient Medications on File Prior to Visit  Medication Sig Dispense Refill   aspirin 81 MG EC tablet Take 81 mg by mouth once daily     atorvastatin (LIPITOR) 40 MG tablet Take 40 mg by mouth once daily     carvediloL (COREG) 6.25 MG tablet Take 6.25 mg by mouth 2 (two) times daily with meals     cholecalciferol, vitamin D3, (VITAMIN D3) 125 mcg (5,000 unit) tablet Take 1 tablet by mouth once daily     ENTRESTO 24-26 mg tablet Take 1 tablet by mouth 2 (two) times daily     levothyroxine (SYNTHROID) 75 MCG tablet Take 75 mcg by mouth once daily     multivitamin with minerals tablet Take 1 tablet by mouth once daily     spironolactone (ALDACTONE) 25 MG tablet Take 12.5 mg by mouth once daily     traZODone (DESYREL) 50 MG tablet Take 50 mg by mouth at bedtime     No current facility-administered medications on file prior to visit.    Family History  Problem Relation Age of Onset   Stroke Mother    Coronary Artery Disease (Blocked arteries around heart) Mother    Skin cancer Father    Coronary Artery Disease (Blocked arteries around heart) Father    Obesity Sister  Skin cancer Brother      Social History   Tobacco Use  Smoking Status Never  Smokeless Tobacco Never     Social History   Socioeconomic History   Marital status: Married  Tobacco Use   Smoking status: Never   Smokeless tobacco: Never  Vaping Use   Vaping status: Never Used  Substance and Sexual Activity   Alcohol use: Yes   Drug use: Never   Social Determinants of Health   Food Insecurity: No Food Insecurity (06/30/2022)   Received from [facility]   Hunger Vital Sign    Worried About Running Out of Food in the Last Year: Never true    Ran Out of Food in the Last Year: Never true  Transportation Needs: No Transportation Needs (06/30/2022)   Received from [facility]    Lack of Transportation (Medical): No    Lack of  Transportation (Non-Medical): No    Objective:    Vitals:   09/11/22 1001  BP: 122/76  Pulse: (!) 113  Temp: 36.7 C (98 F)  SpO2: 98%  Weight: 81.3 kg (179 lb 3.2 oz)  Height: 179.1 cm (5' 10.5")  PainSc: 0-No pain    Body mass index is 25.35 kg/m.  Gen: A&Ox3, no distress  Unlabored respirations There are bilateral likely direct inguinal hernias which are easily reducible  Assessment and Plan:  Diagnoses and all orders for this visit:  Non-recurrent bilateral inguinal hernia without obstruction or gangrene    We discussed the relevant anatomy and we discussed options for repair.  I recommend a robotic approach and went over the technique of the procedure.  Discussed risks of bleeding, infection, pain, scarring, injury to structures in the area including nerves, blood vessels, bowel, bladder, risk of chronic pain, hernia recurrence, risk of seroma or hematoma, urinary retention, and risks of general anesthesia including cardiovascular, pulmonary, and thromboembolic complications.  We discussed typical postop recovery, timeline, and activity limitations.  We also discussed the option of ongoing observation, with high rate of ultimately returning for surgery and risk of increasing size/symptoms from the hernia as well as incarceration/strangulation and went over symptoms that should prompt him to seek emergency treatment.  Questions were come and answered to the patient's satisfaction.  Given his cardiac issues and upcoming procedure, I recommend and he prefers to defer hernia surgery until that is done and he has somewhat recovered.  Additionally discussed that we will request cardiac clearance prior to this surgery.  Patient wishes to proceed with scheduling and anticipate surgery in the fall.     Carrel Leather Carlye Grippe, MD

## 2022-09-12 ENCOUNTER — Telehealth: Payer: Self-pay

## 2022-09-12 NOTE — Telephone Encounter (Signed)
Pt is scheduled for a tele appt 8/26 at 2:40pm. Med rec and consent done

## 2022-09-12 NOTE — Telephone Encounter (Signed)
Pt scheduled 8/26 at 2:40 pm. Med rec and consent done    Patient Consent for Virtual Visit        [name] has provided verbal consent on 09/12/2022 for a virtual visit (video or telephone).   CONSENT FOR VIRTUAL VISIT FOR:  Tyler Hendrix  By participating in this virtual visit I agree to the following:  I hereby voluntarily request, consent and authorize [facility] and its employed or contracted physicians, physician assistants, nurse practitioners or other licensed health care professionals (the Practitioner), to provide me with telemedicine health care services (the "Services") as deemed necessary by the treating Practitioner. I acknowledge and consent to receive the Services by the Practitioner via telemedicine. I understand that the telemedicine visit will involve communicating with the Practitioner through live audiovisual communication technology and the disclosure of certain medical information by electronic transmission. I acknowledge that I have been given the opportunity to request an in-person assessment or other available alternative prior to the telemedicine visit and am voluntarily participating in the telemedicine visit.  I understand that I have the right to withhold or withdraw my consent to the use of telemedicine in the course of my care at any time, without affecting my right to future care or treatment, and that the Practitioner or I may terminate the telemedicine visit at any time. I understand that I have the right to inspect all information obtained and/or recorded in the course of the telemedicine visit and may receive copies of available information for a reasonable fee.  I understand that some of the potential risks of receiving the Services via telemedicine include:  Delay or interruption in medical evaluation due to technological equipment failure or disruption; Information transmitted may not be sufficient (e.g. poor resolution of images) to allow  for appropriate medical decision making by the Practitioner; and/or  In rare instances, security protocols could fail, causing a breach of personal health information.  Furthermore, I acknowledge that it is my responsibility to provide information about my medical history, conditions and care that is complete and accurate to the best of my ability. I acknowledge that Practitioner's advice, recommendations, and/or decision may be based on factors not within their control, such as incomplete or inaccurate data provided by me or distortions of diagnostic images or specimens that may result from electronic transmissions. I understand that the practice of medicine is not an exact science and that Practitioner makes no warranties or guarantees regarding treatment outcomes. I acknowledge that a copy of this consent can be made available to me via my patient portal [facility] MyChart), or I can request a printed copy by calling the office of [facility].    I understand that my insurance will be billed for this visit.   I have read or had this consent read to me. I understand the contents of this consent, which adequately explains the benefits and risks of the Services being provided via telemedicine.  I have been provided ample opportunity to ask questions regarding this consent and the Services and have had my questions answered to my satisfaction. I give my informed consent for the services to be provided through the use of telemedicine in my medical care

## 2022-09-13 ENCOUNTER — Other Ambulatory Visit: Payer: Self-pay | Admitting: Internal Medicine

## 2022-09-14 MED ORDER — LEVOTHYROXINE SODIUM 75 MCG PO TABS
75.0000 ug | ORAL_TABLET | Freq: Every day | ORAL | 3 refills | Status: DC
  Filled 2022-09-14: qty 30, 30d supply, fill #0
  Filled 2022-10-14: qty 30, 30d supply, fill #1

## 2022-09-15 ENCOUNTER — Other Ambulatory Visit (HOSPITAL_COMMUNITY): Payer: Self-pay

## 2022-09-15 ENCOUNTER — Other Ambulatory Visit: Payer: Self-pay

## 2022-09-19 ENCOUNTER — Telehealth: Payer: Self-pay | Admitting: Family Medicine

## 2022-09-19 NOTE — Telephone Encounter (Signed)
error 

## 2022-09-24 DIAGNOSIS — S72302D Unspecified fracture of shaft of left femur, subsequent encounter for closed fracture with routine healing: Secondary | ICD-10-CM | POA: Diagnosis not present

## 2022-10-06 ENCOUNTER — Ambulatory Visit (INDEPENDENT_AMBULATORY_CARE_PROVIDER_SITE_OTHER): Payer: 59

## 2022-10-06 DIAGNOSIS — Z01810 Encounter for preprocedural cardiovascular examination: Secondary | ICD-10-CM

## 2022-10-06 NOTE — Telephone Encounter (Signed)
I s/w the pt per pre op APP today to reschedule tele pre op appt for today as pt is scheduled for a lead revision on 10/20/22. Pt is agreeable to plan of care. Pt has been rescheduled to 10/24/22 for tele pre op appt.

## 2022-10-06 NOTE — Progress Notes (Signed)
    Patient is pending lead revision 10/20/2022. He is rescheduled for virtual visit preoperative risk assessment to ensure there are no changes post surgery.   Carlos Levering, NP  10/06/2022, 7:53 AM

## 2022-10-10 ENCOUNTER — Encounter: Payer: Self-pay | Admitting: Family Medicine

## 2022-10-10 NOTE — Pre-Procedure Instructions (Signed)
Surgical Instructions   Your procedure is scheduled on Monday, September 9th. Report to [facility] Main Entrance "A" at 08:30 A.M., then check in with the Admitting office. Any questions or running late day of surgery: call [phone]  Questions prior to your surgery date: call [phone], Monday-Friday, 8am-4pm. If you experience any cold or flu symptoms such as cough, fever, chills, shortness of breath, etc. between now and your scheduled surgery, please notify us at the above number.      One week prior to surgery, STOP taking any Aspirin (unless otherwise instructed by your surgeon) Aleve, Naproxen, Ibuprofen, Motrin, Advil, Goody's, BC's, all herbal medications, fish oil, and non-prescription vitamins.                     Do NOT Smoke (Tobacco/Vaping) for 24 hours prior to your procedure.  If you use a CPAP at night, you may bring your mask/headgear for your overnight stay.   You will be asked to remove any contacts, glasses, piercing's, hearing aid's, dentures/partials prior to surgery. Please bring cases for these items if needed.    Patients discharged the day of surgery will not be allowed to drive home, and someone needs to stay with them for 24 hours.  SURGICAL WAITING ROOM VISITATION Patients may have no more than 2 support people in the waiting area - these visitors may rotate.   Pre-op nurse will coordinate an appropriate time for 1 ADULT support person, who may not rotate, to accompany patient in pre-op.  Children under the age of 17 must have an adult with them who is not the patient and must remain in the main waiting area with an adult.  If the patient needs to stay at the hospital during part of their recovery, the visitor guidelines for inpatient rooms apply.  Please refer to the [facility] website for the visitor guidelines for any additional information.   If you received a COVID test during your pre-op visit  it is requested that you wear a mask when out  in public, stay away from anyone that may not be feeling well and notify your surgeon if you develop symptoms. If you have been in contact with anyone that has tested positive in the last 10 days please notify you surgeon.

## 2022-10-14 ENCOUNTER — Encounter (HOSPITAL_COMMUNITY)
Admission: RE | Admit: 2022-10-14 | Discharge: 2022-10-14 | Disposition: A | Discharge status: Home / Self Care | Payer: 59 | Source: Ambulatory Visit | Attending: Internal Medicine | Admitting: Internal Medicine

## 2022-10-14 ENCOUNTER — Other Ambulatory Visit: Payer: Self-pay

## 2022-10-14 ENCOUNTER — Encounter (HOSPITAL_COMMUNITY): Payer: Self-pay

## 2022-10-14 ENCOUNTER — Other Ambulatory Visit (HOSPITAL_COMMUNITY): Payer: Self-pay

## 2022-10-14 VITALS — BP 142/73 | HR 83 | Temp 98.3°F | Resp 18 | Ht 71.0 in | Wt 180.5 lb

## 2022-10-14 DIAGNOSIS — I4892 Unspecified atrial flutter: Secondary | ICD-10-CM | POA: Insufficient documentation

## 2022-10-14 DIAGNOSIS — K4090 Unilateral inguinal hernia, without obstruction or gangrene, not specified as recurrent: Secondary | ICD-10-CM

## 2022-10-14 DIAGNOSIS — Z950 Presence of cardiac pacemaker: Secondary | ICD-10-CM | POA: Diagnosis not present

## 2022-10-14 DIAGNOSIS — Y828 Other medical devices associated with adverse incidents: Secondary | ICD-10-CM | POA: Diagnosis not present

## 2022-10-14 DIAGNOSIS — I4891 Unspecified atrial fibrillation: Secondary | ICD-10-CM

## 2022-10-14 DIAGNOSIS — T82111A Breakdown (mechanical) of cardiac pulse generator (battery), initial encounter: Secondary | ICD-10-CM | POA: Diagnosis not present

## 2022-10-14 DIAGNOSIS — Z1152 Encounter for screening for COVID-19: Secondary | ICD-10-CM | POA: Insufficient documentation

## 2022-10-14 DIAGNOSIS — I5021 Acute systolic (congestive) heart failure: Secondary | ICD-10-CM | POA: Diagnosis not present

## 2022-10-14 DIAGNOSIS — Z01812 Encounter for preprocedural laboratory examination: Secondary | ICD-10-CM | POA: Diagnosis not present

## 2022-10-14 DIAGNOSIS — Z01818 Encounter for other preprocedural examination: Secondary | ICD-10-CM

## 2022-10-14 HISTORY — DX: Essential (primary) hypertension: I10

## 2022-10-14 HISTORY — DX: Unilateral inguinal hernia, without obstruction or gangrene, not specified as recurrent: K40.90

## 2022-10-14 LAB — BASIC METABOLIC PANEL
Anion gap: 10 (ref 5–15)
BUN: 13 mg/dL (ref 8–23)
CO2: 26 mmol/L (ref 22–32)
Calcium: 9.4 mg/dL (ref 8.9–10.3)
Chloride: 102 mmol/L (ref 98–111)
Creatinine, Ser: 1.13 mg/dL (ref 0.61–1.24)
GFR, Estimated: >60 mL/min (ref >60)
Glucose, Bld: 111 mg/dL — ABNORMAL HIGH (ref 70–99)
Potassium: 4.5 mmol/L (ref 3.5–5.1)
Sodium: 138 mmol/L (ref 135–145)

## 2022-10-14 LAB — SURGICAL PCR SCREEN
MRSA, PCR: NEGATIVE
Staphylococcus aureus: NEGATIVE

## 2022-10-14 LAB — CBC
HCT: 37.5 % — ABNORMAL LOW (ref 39.0–52.0)
Hemoglobin: 12.4 g/dL — ABNORMAL LOW (ref 13.0–17.0)
MCH: 31.2 pg (ref 26.0–34.0)
MCHC: 33.1 g/dL (ref 30.0–36.0)
MCV: 94.2 fL (ref 80.0–100.0)
Platelets: 282 10*3/uL (ref 150–400)
RBC: 3.98 MIL/uL — ABNORMAL LOW (ref 4.22–5.81)
RDW: 13.7 % (ref 11.5–15.5)
WBC: 8.1 10*3/uL (ref 4.0–10.5)
nRBC: 0 % (ref 0.0–0.2)

## 2022-10-14 LAB — SARS CORONAVIRUS 2 (TAT 6-24 HRS): SARS Coronavirus 2: NEGATIVE

## 2022-10-14 NOTE — Progress Notes (Signed)
PCP - Dr. Gershon Crane Cardiologist - Dr. Zoila Shutter EP- Dr. Lewayne Bunting Endocrinologist- Dr. Elvera Lennox  PPM/ICD - Medtronic PPM (PAT does not need to request device orders or notify rep for this procedure)   Chest x-ray - 06/30/22 EKG - 06/19/22 Stress Test - 08/09/14 ECHO - 11/01/21 Cardiac Cath - 12/02/17  Sleep Study - denies   DM- denies  Blood Thinner Instructions: n/a Aspirin Instructions: just hold on DOS  ERAS Protcol - no, NPO   COVID TEST- 10/14/22   Anesthesia review: yes, cardiac hx, EP, Endocrinologist  Patient denies shortness of breath, fever, cough and chest pain at PAT appointment   All instructions explained to the patient, with a verbal understanding of the material. Patient agrees to go over the instructions while at home for a better understanding. Patient also instructed to wear a mask in public after being tested for COVID-19. The opportunity to ask questions was provided.

## 2022-10-16 NOTE — Telephone Encounter (Signed)
The last PSA was drawn on July 10, so it is too soon to get another one. I sent in a referral to Urology that same day. Please contact [facility] and see what is going on

## 2022-10-20 ENCOUNTER — Other Ambulatory Visit: Payer: Self-pay

## 2022-10-20 ENCOUNTER — Ambulatory Visit (HOSPITAL_COMMUNITY)
Admission: RE | Disposition: A | Discharge status: Home / Self Care | Payer: 59 | Source: Home / Self Care | Attending: Internal Medicine

## 2022-10-20 ENCOUNTER — Ambulatory Visit (HOSPITAL_COMMUNITY): Payer: 59 | Admitting: Physician Assistant

## 2022-10-20 ENCOUNTER — Ambulatory Visit (HOSPITAL_COMMUNITY)
Admission: RE | Admit: 2022-10-20 | Discharge: 2022-10-21 | Disposition: A | Discharge status: Home / Self Care | Payer: 59 | Attending: Internal Medicine | Admitting: Internal Medicine

## 2022-10-20 ENCOUNTER — Ambulatory Visit (HOSPITAL_BASED_OUTPATIENT_CLINIC_OR_DEPARTMENT_OTHER): Payer: 59 | Admitting: Certified Registered Nurse Anesthetist

## 2022-10-20 ENCOUNTER — Ambulatory Visit (HOSPITAL_COMMUNITY): Payer: 59

## 2022-10-20 ENCOUNTER — Encounter (HOSPITAL_COMMUNITY): Payer: Self-pay | Admitting: Internal Medicine

## 2022-10-20 DIAGNOSIS — I442 Atrioventricular block, complete: Secondary | ICD-10-CM | POA: Diagnosis not present

## 2022-10-20 DIAGNOSIS — T82111A Breakdown (mechanical) of cardiac pulse generator (battery), initial encounter: Secondary | ICD-10-CM

## 2022-10-20 DIAGNOSIS — T82110A Breakdown (mechanical) of cardiac electrode, initial encounter: Secondary | ICD-10-CM | POA: Insufficient documentation

## 2022-10-20 DIAGNOSIS — E039 Hypothyroidism, unspecified: Secondary | ICD-10-CM | POA: Diagnosis not present

## 2022-10-20 DIAGNOSIS — I493 Ventricular premature depolarization: Secondary | ICD-10-CM | POA: Diagnosis not present

## 2022-10-20 DIAGNOSIS — Y831 Surgical operation with implant of artificial internal device as the cause of abnormal reaction of the patient, or of later complication, without mention of misadventure at the time of the procedure: Secondary | ICD-10-CM | POA: Diagnosis not present

## 2022-10-20 DIAGNOSIS — Z950 Presence of cardiac pacemaker: Secondary | ICD-10-CM

## 2022-10-20 DIAGNOSIS — I4891 Unspecified atrial fibrillation: Secondary | ICD-10-CM | POA: Insufficient documentation

## 2022-10-20 DIAGNOSIS — I110 Hypertensive heart disease with heart failure: Secondary | ICD-10-CM | POA: Insufficient documentation

## 2022-10-20 DIAGNOSIS — I2510 Atherosclerotic heart disease of native coronary artery without angina pectoris: Secondary | ICD-10-CM | POA: Insufficient documentation

## 2022-10-20 DIAGNOSIS — Z8249 Family history of ischemic heart disease and other diseases of the circulatory system: Secondary | ICD-10-CM | POA: Insufficient documentation

## 2022-10-20 DIAGNOSIS — Z951 Presence of aortocoronary bypass graft: Secondary | ICD-10-CM | POA: Diagnosis not present

## 2022-10-20 DIAGNOSIS — Z45018 Encounter for adjustment and management of other part of cardiac pacemaker: Secondary | ICD-10-CM

## 2022-10-20 DIAGNOSIS — Z4502 Encounter for adjustment and management of automatic implantable cardiac defibrillator: Secondary | ICD-10-CM

## 2022-10-20 DIAGNOSIS — I480 Paroxysmal atrial fibrillation: Secondary | ICD-10-CM | POA: Diagnosis not present

## 2022-10-20 DIAGNOSIS — I5043 Acute on chronic combined systolic (congestive) and diastolic (congestive) heart failure: Secondary | ICD-10-CM | POA: Diagnosis not present

## 2022-10-20 DIAGNOSIS — I5022 Chronic systolic (congestive) heart failure: Secondary | ICD-10-CM | POA: Insufficient documentation

## 2022-10-20 DIAGNOSIS — T82118A Breakdown (mechanical) of other cardiac electronic device, initial encounter: Secondary | ICD-10-CM

## 2022-10-20 DIAGNOSIS — Z953 Presence of xenogenic heart valve: Secondary | ICD-10-CM | POA: Diagnosis not present

## 2022-10-20 DIAGNOSIS — I5021 Acute systolic (congestive) heart failure: Secondary | ICD-10-CM

## 2022-10-20 HISTORY — PX: LEAD EXTRACTION: EP1211

## 2022-10-20 LAB — ECHO INTRAOPERATIVE TEE
Height: 71 in
Weight: 2880 [oz_av]

## 2022-10-20 LAB — PREPARE RBC (CROSSMATCH)

## 2022-10-20 SURGERY — LEAD EXTRACTION
Anesthesia: General

## 2022-10-20 MED ORDER — CEFAZOLIN SODIUM-DEXTROSE 2-4 GM/100ML-% IV SOLN
2.0000 g | INTRAVENOUS | Status: AC
  Administered 2022-10-20: 2 g via INTRAVENOUS

## 2022-10-20 MED ORDER — FENTANYL CITRATE (PF) 100 MCG/2ML IJ SOLN: 25.0000 ug | INTRAMUSCULAR | Status: DC | PRN

## 2022-10-20 MED ORDER — CEFAZOLIN SODIUM-DEXTROSE 1-4 GM/50ML-% IV SOLN
1.0000 g | Freq: Four times a day (QID) | INTRAVENOUS | Status: AC
  Administered 2022-10-20 – 2022-10-21 (×3): 1 g via INTRAVENOUS
  Filled 2022-10-20 (×4): qty 50

## 2022-10-20 MED ORDER — SODIUM CHLORIDE 0.9 % IV SOLN: 80.0000 mg | INTRAVENOUS | Status: AC

## 2022-10-20 MED ORDER — OXYCODONE HCL 5 MG PO TABS
ORAL_TABLET | ORAL | Status: AC
  Administered 2022-10-20: 5 mg via ORAL
  Filled 2022-10-20: qty 1

## 2022-10-20 MED ORDER — FENTANYL CITRATE (PF) 100 MCG/2ML IJ SOLN
INTRAMUSCULAR | Status: AC
  Administered 2022-10-20: 25 ug via INTRAVENOUS
  Filled 2022-10-20: qty 2

## 2022-10-20 MED ORDER — SODIUM CHLORIDE 0.9% IV SOLUTION: Freq: Once | INTRAVENOUS | Status: DC

## 2022-10-20 MED ORDER — HEPARIN (PORCINE) IN NACL 1000-0.9 UT/500ML-% IV SOLN
INTRAVENOUS | Status: DC | PRN
  Administered 2022-10-20: 500 mL

## 2022-10-20 MED ORDER — CEFAZOLIN SODIUM-DEXTROSE 2-4 GM/100ML-% IV SOLN
INTRAVENOUS | Status: AC
  Filled 2022-10-20: qty 100

## 2022-10-20 MED ORDER — SODIUM CHLORIDE 0.9 % IV SOLN
INTRAVENOUS | Status: DC | PRN
  Administered 2022-10-20: 80 mg

## 2022-10-20 MED ORDER — FENTANYL CITRATE (PF) 100 MCG/2ML IJ SOLN
INTRAMUSCULAR | Status: AC
  Filled 2022-10-20: qty 2

## 2022-10-20 MED ORDER — PHENYLEPHRINE 80 MCG/ML (10ML) SYRINGE FOR IV PUSH (FOR BLOOD PRESSURE SUPPORT)
PREFILLED_SYRINGE | INTRAVENOUS | Status: DC | PRN
  Administered 2022-10-20: 80 ug via INTRAVENOUS

## 2022-10-20 MED ORDER — SODIUM CHLORIDE 0.9 % IV SOLN: INTRAVENOUS | Status: DC

## 2022-10-20 MED ORDER — DROPERIDOL 2.5 MG/ML IJ SOLN: 0.6250 mg | Freq: Once | INTRAMUSCULAR | Status: DC | PRN

## 2022-10-20 MED ORDER — POVIDONE-IODINE 10 % EX SWAB
2.0000 | Freq: Once | CUTANEOUS | Status: AC
  Administered 2022-10-20: 2 via TOPICAL

## 2022-10-20 MED ORDER — ORAL CARE MOUTH RINSE: 15.0000 mL | Freq: Once | OROMUCOSAL | Status: AC

## 2022-10-20 MED ORDER — CARVEDILOL 3.125 MG PO TABS
ORAL_TABLET | ORAL | Status: AC
  Filled 2022-10-20: qty 2

## 2022-10-20 MED ORDER — CARVEDILOL 6.25 MG PO TABS: 6.2500 mg | ORAL_TABLET | Freq: Once | ORAL | Status: DC

## 2022-10-20 MED ORDER — CHLORHEXIDINE GLUCONATE 0.12 % MT SOLN
15.0000 mL | Freq: Once | OROMUCOSAL | Status: AC
  Administered 2022-10-20: 15 mL via OROMUCOSAL

## 2022-10-20 MED ORDER — ROCURONIUM BROMIDE 10 MG/ML (PF) SYRINGE
PREFILLED_SYRINGE | INTRAVENOUS | Status: DC | PRN
  Administered 2022-10-20: 20 mg via INTRAVENOUS
  Administered 2022-10-20: 60 mg via INTRAVENOUS
  Administered 2022-10-20: 20 mg via INTRAVENOUS

## 2022-10-20 MED ORDER — LACTATED RINGERS IV SOLN: INTRAVENOUS | Status: DC

## 2022-10-20 MED ORDER — FENTANYL CITRATE (PF) 250 MCG/5ML IJ SOLN
INTRAMUSCULAR | Status: DC | PRN
  Administered 2022-10-20 (×2): 50 ug via INTRAVENOUS

## 2022-10-20 MED ORDER — DEXAMETHASONE SODIUM PHOSPHATE 10 MG/ML IJ SOLN
INTRAMUSCULAR | Status: DC | PRN
  Administered 2022-10-20: 5 mg via INTRAVENOUS

## 2022-10-20 MED ORDER — SODIUM CHLORIDE 0.9 % IV SOLN
INTRAVENOUS | Status: AC
  Filled 2022-10-20: qty 2

## 2022-10-20 MED ORDER — LIDOCAINE 2% (20 MG/ML) 5 ML SYRINGE
INTRAMUSCULAR | Status: DC | PRN
  Administered 2022-10-20: 80 mg via INTRAVENOUS

## 2022-10-20 MED ORDER — PROPOFOL 10 MG/ML IV BOLUS
INTRAVENOUS | Status: DC | PRN
  Administered 2022-10-20: 120 mg via INTRAVENOUS

## 2022-10-20 MED ORDER — IOHEXOL 350 MG/ML SOLN
INTRAVENOUS | Status: DC | PRN
  Administered 2022-10-20: 10 mL

## 2022-10-20 MED ORDER — SUGAMMADEX SODIUM 200 MG/2ML IV SOLN
INTRAVENOUS | Status: DC | PRN
  Administered 2022-10-20 (×2): 100 mg via INTRAVENOUS

## 2022-10-20 MED ORDER — CHLORHEXIDINE GLUCONATE 4 % EX SOLN: 4.0000 | Freq: Once | CUTANEOUS | Status: DC

## 2022-10-20 MED ORDER — PHENYLEPHRINE HCL-NACL 20-0.9 MG/250ML-% IV SOLN
INTRAVENOUS | Status: DC | PRN
  Administered 2022-10-20: 20 ug/min via INTRAVENOUS

## 2022-10-20 MED ORDER — OXYCODONE-ACETAMINOPHEN 5-325 MG PO TABS: 1.0000 | ORAL_TABLET | Freq: Four times a day (QID) | ORAL | Status: DC | PRN

## 2022-10-20 MED ORDER — SODIUM CHLORIDE 0.9 % IV SOLN
INTRAVENOUS | Status: AC
  Administered 2022-10-20: 80 mg
  Filled 2022-10-20: qty 2

## 2022-10-20 MED ORDER — ONDANSETRON HCL 4 MG/2ML IJ SOLN
INTRAMUSCULAR | Status: DC | PRN
  Administered 2022-10-20: 4 mg via INTRAVENOUS

## 2022-10-20 MED ORDER — ACETAMINOPHEN 10 MG/ML IV SOLN: 1000.0000 mg | Freq: Once | INTRAVENOUS | Status: DC | PRN

## 2022-10-20 MED ORDER — LIDOCAINE HCL (PF) 1 % IJ SOLN
INTRAMUSCULAR | Status: DC | PRN
  Administered 2022-10-20: 60 mL

## 2022-10-20 MED ORDER — OXYCODONE HCL 5 MG/5ML PO SOLN: 5.0000 mg | Freq: Once | ORAL | Status: AC | PRN

## 2022-10-20 MED ORDER — CHLORHEXIDINE GLUCONATE 0.12 % MT SOLN
OROMUCOSAL | Status: AC
  Filled 2022-10-20: qty 15

## 2022-10-20 MED ORDER — OXYCODONE HCL 5 MG PO TABS: 5.0000 mg | ORAL_TABLET | Freq: Once | ORAL | Status: AC | PRN

## 2022-10-20 MED ORDER — ACETAMINOPHEN 325 MG PO TABS
325.0000 mg | ORAL_TABLET | ORAL | Status: DC | PRN
  Administered 2022-10-21 (×2): 650 mg via ORAL
  Filled 2022-10-20 (×2): qty 2

## 2022-10-20 MED ORDER — ONDANSETRON HCL 4 MG/2ML IJ SOLN
4.0000 mg | Freq: Four times a day (QID) | INTRAMUSCULAR | Status: DC | PRN
  Administered 2022-10-21: 4 mg via INTRAVENOUS
  Filled 2022-10-20: qty 2

## 2022-10-20 SURGICAL SUPPLY — 28 items
CABLE SURGICAL S-101-97-12 (CABLE) ×1 IMPLANT
CATH ATTAIN COM SURV 6250V-MB2 (CATHETERS) IMPLANT
CATH ATTAIN COMMAND 6250-EH (CATHETERS) IMPLANT
CATH JOSEPH QUAD ALLRED 6F REP (CATHETERS) IMPLANT
CATH RIGHTSITE C315HIS02 (CATHETERS) IMPLANT
COIL ONE TIE COMPRESSION (VASCULAR PRODUCTS) IMPLANT
DEVICE LCKNG LEAD CARDIAC (CATHETERS) IMPLANT
FELT TEFLON 1X6 (MISCELLANEOUS) ×1 IMPLANT
GUIDEWIRE ANGLED .035X150CM (WIRE) IMPLANT
KIT ESSENTIALS PG (KITS) IMPLANT
KIT WRENCH (KITS) IMPLANT
LEAD SELECT SECURE 3830 383069 (Lead) IMPLANT
PACEMAKER PRCT MRI CRTP W1TR01 (Pacemaker) IMPLANT
PAD DEFIB RADIO PHYSIO CONN (PAD) ×1 IMPLANT
POUCH AIGIS-R ANTIBACT PPM (Mesh General) ×1 IMPLANT
POUCH AIGIS-R ANTIBACT PPM MED (Mesh General) IMPLANT
PPM PRECEPTA MRI CRT-P W1TR01 (Pacemaker) ×1 IMPLANT
REMOVAL LLD CARDIAC LEAD EZ (CATHETERS) ×1
SELECT SECURE 3830 383069 (Lead) ×1 IMPLANT
SHEATH 11 SUB-C ROTATE DILATOR (SHEATH) IMPLANT
SHEATH 9FR PRELUDE SNAP 13 (SHEATH) IMPLANT
SHEATH PINNACLE 6F 10CM (SHEATH) IMPLANT
SHEATH TIGHTRAIL MECH 11F (SHEATH) IMPLANT
SLITTER 6232ADJ (MISCELLANEOUS) IMPLANT
STYLET LIBERATOR LOCKING (MISCELLANEOUS) IMPLANT
TRAY PACEMAKER INSERTION (PACKS) ×1 IMPLANT
WIRE HI TORQ VERSACORE-J 145CM (WIRE) IMPLANT
WIRE MAILMAN 182CM (WIRE) IMPLANT

## 2022-10-20 NOTE — Anesthesia Preprocedure Evaluation (Signed)
Anesthesia Evaluation  Patient identified by MRN, date of birth, ID band Patient awake    Reviewed: Allergy & Precautions, H&P , NPO status , Patient's Chart, lab work & pertinent test results  Airway Mallampati: III  TM Distance: >3 FB Neck ROM: Full    Dental no notable dental hx.    Pulmonary neg pulmonary ROS   Pulmonary exam normal breath sounds clear to auscultation       Cardiovascular hypertension, + CAD and +CHF  Normal cardiovascular exam+ dysrhythmias Atrial Fibrillation + pacemaker + Cardiac Defibrillator  Rhythm:Regular Rate:Normal  S/p MVR CABGx2  TTE from 2023:   1. Left ventricular ejection fraction, by estimation, is 30 to 35%. The  left ventricle has moderately decreased function. The left ventricle  demonstrates global hypokinesis. The left ventricular internal cavity size  was mildly dilated. There is moderate   left ventricular hypertrophy. Left ventricular diastolic function could  not be evaluated.   2. Right ventricular systolic function is low normal. The right  ventricular size is mildly enlarged.   3. Left atrial size was moderately dilated.   4. Right atrial size was mildly dilated.   5. The mitral valve has been repaired/replaced. Trivial mitral valve  regurgitation. The mean mitral valve gradient is 4.0 mmHg. There is a 29  mm Edwards Magna Mitral Bovine present in the mitral position. Procedure  Date: 01/27/18.   6. The aortic valve is tricuspid. There is mild calcification of the  aortic valve. Aortic valve regurgitation is not visualized. Aortic valve  sclerosis is present, with no evidence of aortic valve stenosis.   7. The inferior vena cava is normal in size with greater than 50%  respiratory variability, suggesting right atrial pressure of 3 mm     Neuro/Psych negative neurological ROS  negative psych ROS   GI/Hepatic negative GI ROS, Neg liver ROS,,,  Endo/Other  Hypothyroidism     Renal/GU negative Renal ROS  negative genitourinary   Musculoskeletal  (+) Arthritis ,    Abdominal   Peds negative pediatric ROS (+)  Hematology negative hematology ROS (+)   Anesthesia Other Findings   Reproductive/Obstetrics negative OB ROS                              Anesthesia Physical Anesthesia Plan  ASA: 4  Anesthesia Plan: General   Post-op Pain Management:    Induction: Intravenous  PONV Risk Score and Plan: Ondansetron and Dexamethasone  Airway Management Planned: Oral ETT  Additional Equipment: Arterial line and TEE  Intra-op Plan:   Post-operative Plan: Extubation in OR  Informed Consent: I have reviewed the patients History and Physical, chart, labs and discussed the procedure including the risks, benefits and alternatives for the proposed anesthesia with the patient or authorized representative who has indicated his/her understanding and acceptance.     Dental advisory given  Plan Discussed with: CRNA  Anesthesia Plan Comments:          Anesthesia Quick Evaluation

## 2022-10-20 NOTE — Anesthesia Procedure Notes (Signed)
Procedure Name: Intubation Date/Time: 10/20/2022 3:17 PM  Performed by: Laruth Bouchard., CRNAPre-anesthesia Checklist: Patient identified, Emergency Drugs available, Suction available, Patient being monitored and Timeout performed Patient Re-evaluated:Patient Re-evaluated prior to induction Oxygen Delivery Method: Circle system utilized Preoxygenation: Pre-oxygenation with 100% oxygen Induction Type: IV induction Ventilation: Mask ventilation without difficulty and Oral airway inserted - appropriate to patient size Laryngoscope Size: Glidescope and 4 Grade View: Grade I Tube type: Oral Tube size: 7.5 mm Number of attempts: 1 Airway Equipment and Method: Rigid stylet and Video-laryngoscopy Placement Confirmation: ETT inserted through vocal cords under direct vision and breath sounds checked- equal and bilateral Secured at: 22 cm Tube secured with: Tape Dental Injury: Teeth and Oropharynx as per pre-operative assessment  Comments: Intubation performed by Dorna Leitz Haroldine Laws)

## 2022-10-20 NOTE — H&P (Signed)
HPI [name] returns today for followup. He is a pleasant 67 yo man with a h/o chronic systolic heart failure, EF 35%, CHB, who underwent biv PPM insertion several years ago. He was bothered by diaghragmatic stimulation. He has continued to have class 3 symptoms and pacing induced LBBB.  Allergies       Allergies  Allergen Reactions   Other        Pt reported allergic to dust mites that causes of sneezing, runny nose                Current Outpatient Medications  Medication Sig Dispense Refill   aspirin EC 81 MG tablet Take 81 mg by mouth daily.       atorvastatin (LIPITOR) 40 MG tablet Take 1 tablet by mouth daily 90 tablet 3   carvedilol (COREG) 6.25 MG tablet Take 1 tablet (6.25 mg total) by mouth 2 (two) times daily with a meal. 180 tablet 3   levothyroxine (SYNTHROID) 75 MCG tablet TAKE 1 TABLET BY MOUTH DAILY BEFORE BREAKFAST. 90 tablet 3   Multiple Vitamin (MULTIVITAMIN WITH MINERALS) TABS tablet Take 1 tablet by mouth daily.       sacubitril-valsartan (ENTRESTO) 24-26 MG Take 1 tablet by mouth 2 (two) times daily. 180 tablet 3   sildenafil (VIAGRA) 50 MG tablet Take 1 tablet (50 mg total) by mouth daily as needed for erectile dysfunction. 30 tablet 3   spironolactone (ALDACTONE) 25 MG tablet TAKE 1/2 TABLET BY MOUTH ONCE A DAY 45 tablet 3   traZODone (DESYREL) 50 MG tablet Take 1 tablet (50 mg total) by mouth at bedtime. 90 tablet 3      No current facility-administered medications for this visit.              Past Medical History:  Diagnosis Date   Abnormal TSH     Arthritis      ddd   Atrial fibrillation and flutter (HCC)      s/p DCCV 03/26/2018   CAD in native artery      a. 11/2015: s/p 2 vessel PCI of the first diagonal branch of the LAD in the mid left circumflex with DES. // s/p CABG 01/2018   Chronic combined systolic and diastolic CHF (congestive heart failure) (HCC)      sees Dr. Johney Frame    Complete heart block (HCC)      a. s/p Medtronic  ppm 10/2015.   History of radiation therapy 2002   Hodgkin disease (HCC) 2002    a. s/p chest radiation and chemotherapy   Hypothyroidism      sees Dr. Elvera Lennox    Incidental pulmonary nodule 01/19/2018    Left apical opacity - possible scarring   Mitral regurgitation      s/p bioprosthetic MV replacement 01/2018   Pacemaker 10/2015    Medtronic   PVC's (premature ventricular contractions)     Right bundle branch block     S/P CABG x 2 01/27/2018    LIMA to LAD, SVG to RCA, EVH via right thigh   S/P mitral valve replacement with bioprosthetic valve 01/27/2018    29 mm [facility] Mitral stented bovine pericardial tissue valve          ROS:    All systems reviewed and negative except as noted in the HPI.          Past Surgical History:  Procedure Laterality Date   A-FLUTTER ABLATION  N/A 06/20/2021    Procedure: A-FLUTTER ABLATION;  Surgeon: Marinus Maw, MD;  Location: [facility] INVASIVE CV LAB;  Service: Cardiovascular;  Laterality: N/A;   BIV UPGRADE N/A 02/08/2019    Procedure: UPGRADE TO BIV PPM;  Surgeon: Hillis Range, MD;  Location: [facility] INVASIVE CV LAB;  Service: Cardiovascular;  Laterality: N/A;   CARDIAC CATHETERIZATION N/A 11/02/2015    Procedure: Left Heart Cath and Coronary Angiography;  Surgeon: Tonny Bollman, MD;  Location[facility] INVASIVE CV LAB;  Service: Cardiovascular;  Laterality: N/A;   CARDIAC CATHETERIZATION N/A 11/21/2015    Procedure: Coronary Stent Intervention;  Surgeon: Tonny Bollman, MD;  Location: [facility] INVASIVE CV LAB;  Service: Cardiovascular;  Laterality: N/A;   CARDIAC CATHETERIZATION   11/2017   CARDIOVERSION N/A 03/26/2018    Procedure: CARDIOVERSION;  Surgeon: Parke Poisson, MD;  Location: [facility] ENDOSCOPY;  Service: Cardiovascular;  Laterality: N/A;   COLONOSCOPY   01/16/2015    per Dr. Christella Hartigan, benign polyps, repeat in 10 yrs    CORONARY ARTERY BYPASS GRAFT N/A 01/27/2018    Procedure: CORONARY ARTERY BYPASS GRAFTING (CABG) x two, using left  internal mammary artery and right leg greater saphenous vein harvested endoscopically;  Surgeon: Purcell Nails, MD;  Location: [facility] OR;  Service: Open Heart Surgery;  Laterality: N/A;   EP IMPLANTABLE DEVICE N/A 11/02/2015    MDT Adivisa MRI conditional dual chamber pacemaker implanted by Dr Johney Frame for complete heart block   KNEE SURGERY Right 1972   LUMBAR LAMINECTOMY   1996   MITRAL VALVE REPLACEMENT N/A 01/27/2018    Procedure: MITRAL VALVE (MV) REPLACEMENT using Magna Mitral Ease Valve Size ;  Surgeon: Purcell Nails, MD;  Location: [facility] OR;  Service: Open Heart Surgery;  Laterality: N/A;   RIGHT/LEFT HEART CATH AND CORONARY ANGIOGRAPHY N/A 12/02/2017    Procedure: RIGHT/LEFT HEART CATH AND CORONARY ANGIOGRAPHY;  Surgeon: Corky Crafts, MD;  Location: [facility] INVASIVE CV LAB;  Service: Cardiovascular;  Laterality: N/A;   TEE WITHOUT CARDIOVERSION N/A 12/09/2017    Procedure: TRANSESOPHAGEAL ECHOCARDIOGRAM (TEE);  Surgeon: Jodelle Red, MD;  Location: [facility] ENDOSCOPY;  Service: Cardiovascular;  Laterality: N/A;   TEE WITHOUT CARDIOVERSION N/A 01/27/2018    Procedure: TRANSESOPHAGEAL ECHOCARDIOGRAM (TEE);  Surgeon: Purcell Nails, MD;  Location: [facility] OR;  Service: Open Heart Surgery;  Laterality: N/A;                 Family History  Problem Relation Age of Onset   Cancer Other          prostate father hx   Heart disease Mother     Heart disease Father     Colon cancer Neg Hx              Social History         Socioeconomic History   Marital status: Married      Spouse name: Not on file   Number of children: 2   Years of education: Masters   Highest education level: Not on file  Occupational History   Occupation: lawyer      Comment: [employer]  Tobacco Use   Smoking status: Never   Smokeless tobacco: Never  Vaping Use   Vaping Use: Never used  Substance and Sexual Activity   Alcohol use: Yes      Alcohol/week: 5.0 standard  drinks of alcohol      Types: 5 Glasses of wine per week   Drug use: No  Sexual activity: Not on file  Other Topics Concern   Not on file  Social History Narrative    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 in [place]    Social Determinants of Health    Financial Resource Strain: Not on file  Food Insecurity: Not on file  Transportation Needs: Not on file  Physical Activity: Not on file  Stress: Not on file  Social Connections: Not on file  Intimate Partner Violence: Not on file        BP 114/74   Pulse 81   Ht 5\' 11"  (1.803 m)   Wt 196 lb 3.2 oz (89 kg)   SpO2 98%   BMI 27.36 kg/m    Physical Exam:   Well appearing NAD HEENT: Unremarkable Neck:  No JVD, no thyromegally Lymphatics:  No adenopathy Back:  No CVA tenderness Lungs:  Clear with basilar rales HEART:  Regular rate rhythm, no murmurs, no rubs, no clicks Abd:  soft, positive bowel sounds, no organomegally, no rebound, no guarding Ext:  2 plus pulses, no edema, no cyanosis, no clubbing Skin:  No rashes no nodules Neuro:  CN II through XII intact, motor grossly intact   EKG - nsr with pacing induced LBBB, QRS 170 ms   DEVICE  Normal device function.  See PaceArt for details.    Assess/Plan: Chronic systolic heart failure - I have discussed the treatment options. If he is not better with reprogramming of his device then I will have him undergo extraction and biv upgrade. CHB - he has an escape today at 31/min. 3. PPM - we were able to reprogram his PPM with the LV lead turned back on and a quarter volt safety margin. If this does not make him feel better, I will have him come in for a lead extraction and insertion of a LB area lead and a CS lead.I have reviewed the indications/risks/benefits/goals/expectations of biv PPM insertion and extraction of the RV lead and he wishes to proceed if he does not feel better. 4. HTN - his bp is well controlled.     Lewayne Bunting, MD

## 2022-10-20 NOTE — Plan of Care (Signed)
  Problem: Education: Goal: Knowledge of cardiac device and self-care will improve Outcome: Progressing Goal: Ability to safely manage health related needs after discharge will improve Outcome: Progressing Goal: Individualized Educational Video(s) Outcome: Progressing   

## 2022-10-20 NOTE — Transfer of Care (Signed)
Immediate Anesthesia Transfer of Care Note  Patient: Tyler Hendrix  Procedure(s) Performed: LEAD EXTRACTION  Patient Location: PACU  Anesthesia Type:General  Level of Consciousness: awake, alert , and oriented  Airway & Oxygen Therapy: Patient Spontanous Breathing  Post-op Assessment: Report given to RN and Post -op Vital signs reviewed and stable  Post vital signs: Reviewed and stable  Last Vitals:  Vitals Value Taken Time  BP 141/76 10/20/22 1830  Temp    Pulse 50 10/20/22 1839  Resp 17 10/20/22 1839  SpO2 95 % 10/20/22 1839  Vitals shown include unfiled device data.  Last Pain:  Vitals:   10/20/22 0851  TempSrc: Oral         Complications: No notable events documented.

## 2022-10-20 NOTE — Anesthesia Procedure Notes (Signed)
Arterial Line Insertion Start/End9/10/2022 10:06 AM, 10/20/2022 10:06 AM Performed by: [place] Nation, MD, anesthesiologist  Patient location: Pre-op. Preanesthetic checklist: patient identified, IV checked, site marked, risks and benefits discussed, surgical consent, monitors and equipment checked, pre-op evaluation, timeout performed and anesthesia consent Lidocaine 1% used for infiltration Right, radial was placed Catheter size: 20 G Hand hygiene performed  and maximum sterile barriers used   Attempts: 1 Procedure performed using ultrasound guided technique. Following insertion, dressing applied. Post procedure assessment: normal and unchanged  Patient tolerated the procedure well with no immediate complications.

## 2022-10-21 ENCOUNTER — Encounter (HOSPITAL_COMMUNITY): Payer: Self-pay | Admitting: Internal Medicine

## 2022-10-21 ENCOUNTER — Ambulatory Visit (HOSPITAL_COMMUNITY): Payer: 59

## 2022-10-21 DIAGNOSIS — I4891 Unspecified atrial fibrillation: Secondary | ICD-10-CM | POA: Diagnosis not present

## 2022-10-21 DIAGNOSIS — Z951 Presence of aortocoronary bypass graft: Secondary | ICD-10-CM | POA: Diagnosis not present

## 2022-10-21 DIAGNOSIS — T82110A Breakdown (mechanical) of cardiac electrode, initial encounter: Secondary | ICD-10-CM | POA: Diagnosis not present

## 2022-10-21 DIAGNOSIS — Z8249 Family history of ischemic heart disease and other diseases of the circulatory system: Secondary | ICD-10-CM | POA: Diagnosis not present

## 2022-10-21 DIAGNOSIS — R918 Other nonspecific abnormal finding of lung field: Secondary | ICD-10-CM | POA: Diagnosis not present

## 2022-10-21 DIAGNOSIS — Z953 Presence of xenogenic heart valve: Secondary | ICD-10-CM | POA: Diagnosis not present

## 2022-10-21 DIAGNOSIS — E039 Hypothyroidism, unspecified: Secondary | ICD-10-CM | POA: Diagnosis not present

## 2022-10-21 DIAGNOSIS — Z950 Presence of cardiac pacemaker: Secondary | ICD-10-CM | POA: Diagnosis not present

## 2022-10-21 DIAGNOSIS — I442 Atrioventricular block, complete: Secondary | ICD-10-CM | POA: Diagnosis not present

## 2022-10-21 DIAGNOSIS — I517 Cardiomegaly: Secondary | ICD-10-CM | POA: Diagnosis not present

## 2022-10-21 DIAGNOSIS — I110 Hypertensive heart disease with heart failure: Secondary | ICD-10-CM | POA: Diagnosis not present

## 2022-10-21 DIAGNOSIS — I2510 Atherosclerotic heart disease of native coronary artery without angina pectoris: Secondary | ICD-10-CM | POA: Diagnosis not present

## 2022-10-21 DIAGNOSIS — I5022 Chronic systolic (congestive) heart failure: Secondary | ICD-10-CM | POA: Diagnosis not present

## 2022-10-21 MED FILL — Fentanyl Citrate Soln Prefilled Syringe 100 MCG/2ML: INTRAMUSCULAR | Qty: 2 | Status: AC

## 2022-10-21 NOTE — Discharge Summary (Addendum)
ELECTROPHYSIOLOGY PROCEDURE DISCHARGE SUMMARY    Patient ID: Tyler Hendrix,  MRN: [id number], DOB/AGE: April 08, 1955 67 y.o.  Admit date: 10/20/2022 Discharge date: 10/21/2022  Primary Care Physician: [name], MD  Primary Cardiologist: Chrystie Nose, MD  Electrophysiologist: Dr. Ladona Ridgel   Primary Discharge Diagnosis:  Symptomatic bradycardia status post pacemaker implantation this admission  Secondary Discharge Diagnosis:  Chronic Systolic CHF with LVEF 35%  Allergies  Allergen Reactions   Other     Pt reported allergic to dust mites that causes of sneezing, runny nose     Procedures This Admission:  1.  Successful extraction of prior left ventricular pacing lead.   2. Implantation of a Medtronic CRT PPM on 10/20/2022 by Dr. Ladona Ridgel. The patient received a Medtronic Percepta CRT-P [phone]  with preexisting right atrial lead, right ventricular lead, and a Medtronic SelectSure [id number] left ventricular lead.  There were no immediate post procedure complications.   2.  CXR on 10/21/2022 demonstrated no pneumothorax status post device implantation. Mild consolidation in LUL adjacent to aortic arch.  No PTX.       Brief HPI: ILAI Hendrix is a 67 y.o. male with chronic systolic HF (LVEF 35%), CHB who underwent prior BiV PPM.  He was bothered by doaphragmatic stimulation and continued to have NYHA Class III symptoms, & pacing induced LBBB.  He was admitted for extraction of prior left ventricular pacing lead.     Past medical history includes HTN, CAD s/p CABG, chronic combined systolic & diastolic CHF, Hodgkin's Lymphoma s/p chest XRT / chemo, MR s/p bioprosthetic MVR, PVC's, CHB s/p PPM, AF/AFL s/p DCCV.  The patient has had symptomatic bradycardia without reversible causes identified.  Risks, benefits, and alternatives to PPM implantation were reviewed with the patient who wished to proceed.   Hospital Course:  The patient was admitted and underwent implantation of a  Medtronic CRT-P with details as outlined above.  He was monitored on telemetry overnight which demonstrated appropriate pacing.  Left chest was without hematoma or ecchymosis.  The device was interrogated and found to be functioning normally.  CXR was obtained and demonstrated no pneumothorax status post device implantation - note LUL opacity & will need follow up 2V CXR to assess LUL infiltrate. Wound care, arm mobility, and restrictions were reviewed with the patient.  The patient was examined and considered stable for discharge to home.    Anticoagulation resumption This patient is not on anticoagulation - can resume oral ASA at discharge.    Physical Exam: Vitals:   10/20/22 2030 10/21/22 0006 10/21/22 0311 10/21/22 0733  BP: 138/65 111/61 124/61 (!) 102/54  Pulse: 62 (!) 51 (!) 55 62  Resp: 16 16 18 16   Temp: 98.3 F (36.8 C) 98 F (36.7 C) 98.3 F (36.8 C) 98.4 F (36.9 C)  TempSrc: Oral Oral  Oral  SpO2: 96% 95% 95% 95%  Weight:      Height:        GEN- NAD. A&O x 3.  HEENT: Normocephalic, atraumatic, MM pink/moist  Lungs- CTAB, Normal effort.  Heart- RRR, No M/G/R. GI- Soft, NT, ND.  Extremities- No clubbing, cyanosis, or edema Skin- warm and dry, no rash or lesion, left chest without hematoma/ecchymosis  Discharge Medications:  Allergies as of 10/21/2022       Reactions   Other    Pt reported allergic to dust mites that causes of sneezing, runny nose        Medication List  TAKE these medications    aspirin EC 81 MG tablet Take 81 mg by mouth daily.   atorvastatin 40 MG tablet Commonly known as: LIPITOR Take 1 tablet (40 mg total) by mouth daily.   carvedilol 6.25 MG tablet Commonly known as: COREG Take 1 tablet (6.25 mg total) by mouth 2 (two) times daily with a meal.   Cholecalciferol 125 MCG (5000 UT) Tabs Take 1 tablet by mouth daily.   docusate sodium 100 MG capsule Commonly known as: COLACE Take 1 capsule (100 mg total) by mouth 2 (two)  times daily.   Entresto 24-26 MG Generic drug: sacubitril-valsartan Take 1 tablet by mouth 2 (two) times daily.   levothyroxine 75 MCG tablet Commonly known as: SYNTHROID Take 1 tablet (75 mcg total) by mouth daily before breakfast.   methocarbamol 500 MG tablet Commonly known as: ROBAXIN Take 1-2 tablets (500-1,000 mg total) by mouth every 6 (six) hours as needed for muscle spasms.   multivitamin with minerals Tabs tablet Take 1 tablet by mouth daily.   oxyCODONE-acetaminophen 5-325 MG tablet Commonly known as: PERCOCET/ROXICET Take 1-2 tablets by mouth every 8 (eight) hours as needed for pain   sildenafil 50 MG tablet Commonly known as: Viagra Take 1 tablet (50 mg total) by mouth daily as needed for erectile dysfunction.   spironolactone 25 MG tablet Commonly known as: ALDACTONE Take one-half tablet (12.5 mg total) by mouth daily.   traZODone 50 MG tablet Commonly known as: DESYREL Take 1 tablet (50 mg total) by mouth at bedtime. What changed: how much to take        Disposition: Home.  Discharge Instructions     Call MD for:  extreme fatigue   Complete by: As directed    Call MD for:  persistant dizziness or light-headedness   Complete by: As directed    Call MD for:  redness, tenderness, or signs of infection (pain, swelling, redness, odor or green/yellow discharge around incision site)   Complete by: As directed    Call MD for:  temperature >100.4   Complete by: As directed    Diet - low sodium heart healthy   Complete by: As directed    Driving Restrictions   Complete by: As directed    No driving until seen in device clinic.   Increase activity slowly   Complete by: As directed         Duration of Discharge Encounter: Greater than 30 minutes including physician time.  Signed, Canary Brim, MSN, APRN, NP-C, AGACNP-BC [facility] - Electrophysiology  10/21/2022, 12:01 PM  EP Attending  Patient seen and examined. Agree with above. The  patient is stable after extraction of his LV lead and insertion of a left bundle lead and new biv PPM. His CXR, ECG and incision look good. He can be discharged home with the usual followup.   Sharlot Gowda Auria Mckinlay,MD

## 2022-10-21 NOTE — Addendum Note (Signed)
Addendum  created 10/21/22 2352 by Laruth Bouchard., CRNA   Clinical Note Signed, Intraprocedure Blocks edited, SmartForm saved

## 2022-10-21 NOTE — Discharge Instructions (Signed)
After Your Pacemaker   You have a Medtronic Pacemaker  ACTIVITY Do not lift your arm above shoulder height for 1 week after your procedure. After 7 days, you may progress as below.  You should remove your sling 24 hours after your procedure, unless otherwise instructed by your provider.     Tuesday October 28, 2022  Wednesday October 29, 2022 Thursday October 30, 2022 Friday October 31, 2022   Do not lift, push, pull, or carry anything over 10 pounds with the affected arm until 6 weeks (Tuesday December 02, 2022 ) after your procedure.   You may drive AFTER your wound check, unless you have been told otherwise by your provider.   Ask your healthcare provider when you can go back to work   INCISION/Dressing If you are on a blood thinner such as Coumadin, Xarelto, Eliquis, Plavix, or Pradaxa please confirm with your provider when this should be resumed.   If large square, outer bandage is left in place, this can be removed after 24 hours from your procedure. Do not remove steri-strips or glue as below.   If a PRESSURE DRESSING (a bulky dressing that usually goes up over your shoulder) was applied or left in place, please follow instructions given by your provider on when to return to have this removed.   Monitor your Pacemaker site for redness, swelling, and drainage. Call the device clinic at [phone] if you experience these symptoms or fever/chills.  If your incision is sealed with Steri-strips or staples, you may shower 7 days after your procedure or when told by your provider. Do not remove the steri-strips or let the shower hit directly on your site. You may wash around your site with soap and water.    If you were discharged in a sling, please do not wear this during the day more than 48 hours after your surgery unless otherwise instructed. This may increase the risk of stiffness and soreness in your shoulder.   Avoid lotions, ointments, or perfumes over your incision  until it is well-healed.  You may use a hot tub or a pool AFTER your wound check appointment if the incision is completely closed.  Pacemaker Alerts:  Some alerts are vibratory and others beep. These are NOT emergencies. Please call our office to let us know. If this occurs at night or on weekends, it can wait until the next business day. Send a remote transmission.  If your device is capable of reading fluid status (for heart failure), you will be offered monthly monitoring to review this with you.   DEVICE MANAGEMENT Remote monitoring is used to monitor your pacemaker from home. This monitoring is scheduled every 91 days by our office. It allows [place] to keep an eye on the functioning of your device to ensure it is working properly. You will routinely see your Electrophysiologist annually (more often if necessary).   You should receive your ID card for your new device in 4-8 weeks. Keep this card with you at all times once received. Consider wearing a medical alert bracelet or necklace.  Your Pacemaker may be MRI compatible. This will be discussed at your next office visit/wound check.  You should avoid contact with strong electric or magnetic fields.   Do not use amateur (ham) radio equipment or electric (arc) welding torches. MP3 player headphones with magnets should not be used. Some devices are safe to use if held at least 12 inches (30 cm) from your Pacemaker. These include power tools, lawn  mowers, and speakers. If you are unsure if something is safe to use, ask your health care provider.  When using your cell phone, hold it to the ear that is on the opposite side from the Pacemaker. Do not leave your cell phone in a pocket over the Pacemaker.  You may safely use electric blankets, heating pads, computers, and microwave ovens.  Call the office right away if: You have chest pain. You feel more short of breath than you have felt before. You feel more light-headed than you have felt  before. Your incision starts to open up.  This information is not intended to replace advice given to you by your health care provider. Make sure you discuss any questions you have with your health care provider.

## 2022-10-21 NOTE — Anesthesia Postprocedure Evaluation (Signed)
Anesthesia Post Note  Patient: Tyler Hendrix  Procedure(s) Performed: LEAD EXTRACTION     Patient location during evaluation: PACU Anesthesia Type: General Level of consciousness: sedated and patient cooperative Pain management: pain level controlled Vital Signs Assessment: post-procedure vital signs reviewed and stable Respiratory status: spontaneous breathing Cardiovascular status: stable Anesthetic complications: no   No notable events documented.  Last Vitals:  Vitals:   10/21/22 0311 10/21/22 0733  BP: 124/61 (!) 102/54  Pulse: (!) 55 62  Resp: 18 16  Temp: 36.8 C 36.9 C  SpO2: 95% 95%    Last Pain:  Vitals:   10/21/22 1125  TempSrc:   PainSc: 2                  Lewie Loron

## 2022-10-22 ENCOUNTER — Other Ambulatory Visit (HOSPITAL_COMMUNITY): Payer: Self-pay

## 2022-10-22 ENCOUNTER — Encounter: Payer: Self-pay | Admitting: Internal Medicine

## 2022-10-22 NOTE — Telephone Encounter (Signed)
Tyler Hendrix,  Per patient, he was told you would see him in clinic (9/17, only 7 days) post op Lead Extraction and Revision to check his wound and release him for driving and showering.   Patient is not on your schedule and you have no openings.  Also, he is not scheduled with device clinic until the following week per our 10-14 day protocol.   Do you remember this patient? Is there something special we should know or is it okay for him to have wound check at 7 days? Can we release him to drive at Day 7 and discuss waterproof covering options if needed for showering?    Let me know your thoughts and I will follow up with patient.  Thanks.

## 2022-10-23 ENCOUNTER — Ambulatory Visit: Payer: 59 | Admitting: Internal Medicine

## 2022-10-23 LAB — BPAM RBC
Blood Product Expiration Date: 202410072359
Blood Product Expiration Date: 202410072359
Blood Product Expiration Date: 202410082359
Blood Product Expiration Date: 202410082359
ISSUE DATE / TIME: 202409101219
ISSUE DATE / TIME: 202409101705
Unit Type and Rh: 5100
Unit Type and Rh: 5100
Unit Type and Rh: 5100
Unit Type and Rh: 5100

## 2022-10-23 LAB — TYPE AND SCREEN
ABO/RH(D): O POS
Antibody Screen: NEGATIVE
Unit division: 0
Unit division: 0
Unit division: 0
Unit division: 0

## 2022-10-24 ENCOUNTER — Ambulatory Visit: Payer: 59 | Attending: Cardiology | Admitting: Cardiology

## 2022-10-24 DIAGNOSIS — Z01810 Encounter for preprocedural cardiovascular examination: Secondary | ICD-10-CM | POA: Diagnosis not present

## 2022-10-24 NOTE — Progress Notes (Signed)
Virtual Visit via Telephone Note   Because of Tyler Hendrix's co-morbid illnesses, he is at least at moderate risk for complications without adequate follow up.  This format is felt to be most appropriate for this patient at this time.  The patient did not have access to video technology/had technical difficulties with video requiring transitioning to audio format only (telephone).  All issues noted in this document were discussed and addressed.  No physical exam could be performed with this format.  Please refer to the patient's chart for his consent to telehealth for [name].  Evaluation Performed:  Preoperative cardiovascular risk assessment _____________   Date:  10/24/2022   Patient ID:  Tyler Hendrix, DOB [birth year], MRN [id number] Patient Location:  Home Provider location:   Office  Primary Care Provider:  [name], MD Primary Cardiologist:  Chrystie Nose, MD  Chief Complaint / Patient Profile   67 y.o. y/o male with a h/o chronic systolic heart failure, EF 35%, CHB, CAD s/p CABG who is pending hernia surgery (date TBD) and presents today for telephonic preoperative cardiovascular risk assessment.  History of Present Illness    Tyler Hendrix is a 67 y.o. male who presents via audio/video conferencing for a telehealth visit today.  Pt was last seen in cardiology clinic on 06/19/22 by Dr. Ladona Ridgel. He has since undergone lead revision/implantation of Medtronic CRT-P and extraction of prior LV pacing lead. At time of discharge from the hospital on 10/21/22 Tyler Hendrix was doing well.  The patient is now pending procedure as outlined above.   At baseline, patient exertionally limited now secondary to orthopedic restrictions but has not had chest pain or new shortness of breath with recent activity. Even today he went to the gym and did a light leg workout without symptoms. He also denies hematuria, weakness, presyncope, syncope, orthopnea, and PND.    With regard to recovery s/p lead revision/device implantation, patient denies swelling, redness, severe pain at site of incision.  He reports mild soreness at area of incision on chest. He confirms his wound dressing is still in place. He is aware of/following arm movement restrictions.   Past Medical History    Past Medical History:  Diagnosis Date   Abnormal TSH    Arthritis    ddd   Atrial fibrillation and flutter (HCC)    s/p DCCV 03/26/2018   CAD in native artery    a. 11/2015: s/p 2 vessel PCI of the first diagonal branch of the LAD in the mid left circumflex with DES. // s/p CABG 01/2018   Chronic combined systolic and diastolic CHF (congestive heart failure) (HCC)    sees Dr. Johney Frame    Complete heart block (HCC)    a. s/p Medtronic ppm 10/2015.   Dyspnea    with exertion   History of radiation therapy 2002   Hodgkin disease (HCC) 2002   a. s/p chest radiation and chemotherapy   Hypertension    Hypothyroidism    sees Dr. Elvera Lennox    Incidental pulmonary nodule 01/19/2018   Left apical opacity - possible scarring   Inguinal hernia 10/14/2022   bilateral   Mitral regurgitation    s/p bioprosthetic MV replacement 01/2018   Pacemaker 10/2015   Medtronic   PVC's (premature ventricular contractions)    Right bundle branch block    S/P CABG x 2 01/27/2018   LIMA to LAD, SVG to RCA, EVH via right thigh   S/P mitral valve  replacement with bioprosthetic valve 01/27/2018   29 mm [facility] Mitral stented bovine pericardial tissue valve   Past Surgical History:  Procedure Laterality Date   A-FLUTTER ABLATION N/A 06/20/2021   Procedure: A-FLUTTER ABLATION;  Surgeon: Marinus Maw, MD;  Location: [facility] INVASIVE CV LAB;  Service: Cardiovascular;  Laterality: N/A;   BIV UPGRADE N/A 02/08/2019   Procedure: UPGRADE TO BIV PPM;  Surgeon: Hillis Range, MD;  Location: [facility] INVASIVE CV LAB;  Service: Cardiovascular;  Laterality: N/A;   CARDIAC CATHETERIZATION N/A 11/02/2015    Procedure: Left Heart Cath and Coronary Angiography;  Surgeon: Tonny Bollman, MD;  Location: [facility] INVASIVE CV LAB;  Service: Cardiovascular;  Laterality: N/A;   CARDIAC CATHETERIZATION N/A 11/21/2015   Procedure: Coronary Stent Intervention;  Surgeon: Tonny Bollman, MD;  Location: [facility] INVASIVE CV LAB;  Service: Cardiovascular;  Laterality: N/A;   CARDIAC CATHETERIZATION  11/2017   CARDIOVERSION N/A 03/26/2018   Procedure: CARDIOVERSION;  Surgeon: Parke Poisson, MD;  Location: [facility] ENDOSCOPY;  Service: Cardiovascular;  Laterality: N/A;   COLONOSCOPY  01/16/2015   per Dr. Christella Hartigan, benign polyps, repeat in 10 yrs    CORONARY ARTERY BYPASS GRAFT N/A 01/27/2018   Procedure: CORONARY ARTERY BYPASS GRAFTING (CABG) x two, using left internal mammary artery and right leg greater saphenous vein harvested endoscopically;  Surgeon: Purcell Nails, MD;  Location: [facility] OR;  Service: Open Heart Surgery;  Laterality: N/A;   EP IMPLANTABLE DEVICE N/A 11/02/2015   MDT Adivisa MRI conditional dual chamber pacemaker implanted by Dr Johney Frame for complete heart block   FEMUR IM NAIL Left 07/01/2022   Procedure: INTRAMEDULLARY (IM) NAIL FEMORAL LEFT ANTEGRADE;  Surgeon: Myrene Galas, MD;  Location: [facility] OR;  Service: Orthopedics;  Laterality: Left;   KNEE SURGERY Right 1972   LEAD EXTRACTION N/A 10/20/2022   Procedure: LEAD EXTRACTION;  Surgeon: Marinus Maw, MD;  Location: [facility] INVASIVE CV LAB;  Service: Cardiovascular;  Laterality: N/A;   LUMBAR LAMINECTOMY  1996   MITRAL VALVE REPLACEMENT N/A 01/27/2018   Procedure: MITRAL VALVE (MV) REPLACEMENT using Magna Mitral Ease Valve Size ;  Surgeon: Purcell Nails, MD;  Location: [facility] OR;  Service: Open Heart Surgery;  Laterality: N/A;   RIGHT/LEFT HEART CATH AND CORONARY ANGIOGRAPHY N/A 12/02/2017   Procedure: RIGHT/LEFT HEART CATH AND CORONARY ANGIOGRAPHY;  Surgeon: Corky Crafts, MD;  Location: [facility] INVASIVE CV LAB;  Service: Cardiovascular;  Laterality: N/A;   TEE  WITHOUT CARDIOVERSION N/A 12/09/2017   Procedure: TRANSESOPHAGEAL ECHOCARDIOGRAM (TEE);  Surgeon: Jodelle Red, MD;  Location: [facility] ENDOSCOPY;  Service: Cardiovascular;  Laterality: N/A;   TEE WITHOUT CARDIOVERSION N/A 01/27/2018   Procedure: TRANSESOPHAGEAL ECHOCARDIOGRAM (TEE);  Surgeon: Purcell Nails, MD;  Location: [facility] OR;  Service: Open Heart Surgery;  Laterality: N/A;    Allergies  Allergies  Allergen Reactions   Other     Pt reported allergic to dust mites that causes of sneezing, runny nose    Home Medications    Prior to Admission medications   Medication Sig Start Date End Date Taking? Authorizing Provider  aspirin EC 81 MG tablet Take 81 mg by mouth daily. 11/16/18   [provider]  atorvastatin (LIPITOR) 40 MG tablet Take 1 tablet (40 mg total) by mouth daily. 08/19/22   [name], MD  carvedilol (COREG) 6.25 MG tablet Take 1 tablet (6.25 mg total) by mouth 2 (two) times daily with a meal. 08/19/22   [name], MD  Cholecalciferol 125 MCG (5000  UT) TABS Take 1 tablet by mouth daily. 07/03/22   Montez Morita, PA-C  docusate sodium (COLACE) 100 MG capsule Take 1 capsule (100 mg total) by mouth 2 (two) times daily. Patient not taking: Reported on 10/03/2022 07/03/22   Joycelyn Das, MD  levothyroxine (SYNTHROID) 75 MCG tablet Take 1 tablet (75 mcg total) by mouth daily before breakfast. 09/14/22   Carlus Pavlov, MD  methocarbamol (ROBAXIN) 500 MG tablet Take 1-2 tablets (500-1,000 mg total) by mouth every 6 (six) hours as needed for muscle spasms. Patient not taking: Reported on 10/03/2022 07/03/22   Montez Morita, PA-C  Multiple Vitamin (MULTIVITAMIN WITH MINERALS) TABS tablet Take 1 tablet by mouth daily.    [provider]  oxyCODONE-acetaminophen (PERCOCET/ROXICET) 5-325 MG tablet Take 1-2 tablets by mouth every 8 (eight) hours as needed for pain Patient not taking: Reported on 10/03/2022 07/30/22     sacubitril-valsartan (ENTRESTO) 24-26 MG Take  1 tablet by mouth 2 (two) times daily. 02/07/22 02/07/23  Chrystie Nose, MD  sildenafil (VIAGRA) 50 MG tablet Take 1 tablet (50 mg total) by mouth daily as needed for erectile dysfunction. 05/24/20   [name], MD  spironolactone (ALDACTONE) 25 MG tablet Take one-half tablet (12.5 mg total) by mouth daily. 07/21/22 07/21/23  [name], MD  traZODone (DESYREL) 50 MG tablet Take 1 tablet (50 mg total) by mouth at bedtime. Patient taking differently: Take 50-75 mg by mouth at bedtime. 07/21/22   [name], MD    Physical Exam    Vital Signs:  Tyler Hendrix does not have vital signs available for review today.  Given telephonic nature of communication, physical exam is limited. AAOx3. NAD. Normal affect.  Speech and respirations are unlabored.  Accessory Clinical Findings    None  Assessment & Plan    1.  Preoperative Cardiovascular Risk Assessment:  The patient was advised that if he develops new symptoms prior to surgery to contact our office to arrange for a follow-up visit, and he verbalized understanding.  According to the Revised Cardiac Risk Index (RCRI), his Perioperative Risk of Major Cardiac Event is (%): 6.6  His Functional Capacity in METs is: 7.99 according to the [facility] Activity Status Index (DASI).  Therefore, based on ACC/AHA guidelines, patient would be at acceptable risk for the planned procedure without further cardiovascular testing. I will route this recommendation to the requesting party via Epic fax function.   Per office protocol, if patient is without any new symptoms or concerns at the time of their virtual visit, he/she may hold Aspirin for 5-7 days prior to procedure. Please resume Aspirin as soon as possible postprocedure, at the discretion of the surgeon.    I will forward this note to the device clinic along with original clearance request for review of additional recommendation for intraoperative device management.   A copy of this note  will be routed to requesting surgeon.  Time:   Today, I have spent 10 minutes with the patient with telehealth technology discussing medical history, symptoms, and management plan.     Perlie Gold, PA-C  10/24/2022, 3:03 PM

## 2022-10-30 ENCOUNTER — Ambulatory Visit: Payer: 59 | Attending: Cardiovascular Disease

## 2022-10-30 ENCOUNTER — Ambulatory Visit
Admission: RE | Admit: 2022-10-30 | Discharge: 2022-10-30 | Disposition: A | Discharge status: Home / Self Care | Payer: 59 | Source: Ambulatory Visit | Attending: Internal Medicine | Admitting: Internal Medicine

## 2022-10-30 DIAGNOSIS — R918 Other nonspecific abnormal finding of lung field: Secondary | ICD-10-CM

## 2022-10-30 DIAGNOSIS — Z45018 Encounter for adjustment and management of other part of cardiac pacemaker: Secondary | ICD-10-CM | POA: Diagnosis not present

## 2022-10-30 DIAGNOSIS — I442 Atrioventricular block, complete: Secondary | ICD-10-CM

## 2022-10-30 LAB — CUP PACEART INCLINIC DEVICE CHECK
Battery Remaining Longevity: 114 mo
Battery Voltage: 3.14 V
Brady Statistic AP VP Percent: 57.28 %
Brady Statistic AP VS Percent: 0.02 %
Brady Statistic AS VP Percent: 42.39 %
Brady Statistic AS VS Percent: 0.31 %
Brady Statistic RA Percent Paced: 57.47 %
Brady Statistic RV Percent Paced: 99.67 %
Date Time Interrogation Session: 20240919121227
Implantable Lead Connection Status: 753985
Implantable Lead Connection Status: 753985
Implantable Lead Connection Status: 753985
Implantable Lead Implant Date: 20170922
Implantable Lead Implant Date: 20170922
Implantable Lead Implant Date: 20240909
Implantable Lead Location: 753859
Implantable Lead Location: 753860
Implantable Lead Location: 753862
Implantable Lead Model: 3830
Implantable Lead Model: 5076
Implantable Lead Model: 5076
Implantable Pulse Generator Implant Date: 20240909
Lead Channel Impedance Value: 228 Ohm
Lead Channel Impedance Value: 2584 Ohm
Lead Channel Impedance Value: 323 Ohm
Lead Channel Impedance Value: 323 Ohm
Lead Channel Impedance Value: 361 Ohm
Lead Channel Impedance Value: 437 Ohm
Lead Channel Impedance Value: 513 Ohm
Lead Channel Impedance Value: 608 Ohm
Lead Channel Impedance Value: 665 Ohm
Lead Channel Pacing Threshold Amplitude: 0.375 V
Lead Channel Pacing Threshold Amplitude: 0.625 V
Lead Channel Pacing Threshold Amplitude: 0.75 V
Lead Channel Pacing Threshold Pulse Width: 0.4 ms
Lead Channel Pacing Threshold Pulse Width: 0.4 ms
Lead Channel Pacing Threshold Pulse Width: 0.4 ms
Lead Channel Sensing Intrinsic Amplitude: 20.875 mV
Lead Channel Sensing Intrinsic Amplitude: 3 mV
Lead Channel Sensing Intrinsic Amplitude: 4.375 mV
Lead Channel Setting Pacing Amplitude: 1.5 V
Lead Channel Setting Pacing Amplitude: 2.5 V
Lead Channel Setting Pacing Amplitude: 2.5 V
Lead Channel Setting Pacing Pulse Width: 0.4 ms
Lead Channel Setting Pacing Pulse Width: 0.4 ms
Lead Channel Setting Sensing Sensitivity: 2.8 mV
Zone Setting Status: 755011
Zone Setting Status: 755011

## 2022-10-30 NOTE — Patient Instructions (Signed)
You will get a chest xray.   [facility] Address: [address] [place], [place], [place] [address] Phone: [phone]  After Your Pacemaker   Monitor your pacemaker site for redness, swelling, and drainage. Call the device clinic at [phone] if you experience these symptoms or fever/chills.  Your incision was closed with Steri-strips or staples:  You may shower 7 days after your procedure and wash your incision with soap and water. Avoid lotions, ointments, or perfumes over your incision until it is well-healed.  You may use a hot tub or a pool after your wound check appointment if the incision is completely closed.  Do not lift, push or pull greater than 10 pounds with the affected arm until 6 weeks after your procedure. There are no other restrictions in arm movement after your wound check appointment.  You may drive, unless driving has been restricted by your healthcare providers.  Remote monitoring is used to monitor your pacemaker from home. This monitoring is scheduled every 91 days by our office. It allows [place] to keep an eye on the functioning of your device to ensure it is working properly. You will routinely see your Electrophysiologist annually (more often if necessary).

## 2022-10-30 NOTE — Progress Notes (Signed)
Wound check appointment. Steri-strips removed. Wound without redness or edema. Incision edges approximated, wound well healed. Normal device function. Thresholds, sensing, and impedances consistent with implant measurements. Device programmed at 3.5V/auto capture programmed on for extra safety margin until 3 month visit. Histogram distribution appropriate for patient and level of activity. No mode switches or high ventricular rates noted. Patient educated about wound care, arm mobility, lifting restrictions. ROV in 3 months with implanting physician.  Sent for chest Xray per Dr. Ladona Ridgel.

## 2022-10-31 ENCOUNTER — Telehealth: Payer: Self-pay

## 2022-10-31 DIAGNOSIS — R918 Other nonspecific abnormal finding of lung field: Secondary | ICD-10-CM

## 2022-10-31 NOTE — Telephone Encounter (Signed)
Per Dr. Burnett Kanaris to Dr. Delton Coombes for continued LUL infiltrate.

## 2022-11-03 ENCOUNTER — Ambulatory Visit: Payer: 59 | Admitting: Family Medicine

## 2022-11-03 ENCOUNTER — Other Ambulatory Visit: Payer: Self-pay

## 2022-11-03 ENCOUNTER — Encounter: Payer: Self-pay | Admitting: Family Medicine

## 2022-11-03 ENCOUNTER — Other Ambulatory Visit (HOSPITAL_COMMUNITY): Payer: Self-pay

## 2022-11-03 VITALS — BP 124/68 | HR 59 | Temp 98.3°F | Wt 177.0 lb

## 2022-11-03 DIAGNOSIS — R053 Chronic cough: Secondary | ICD-10-CM

## 2022-11-03 DIAGNOSIS — R972 Elevated prostate specific antigen [PSA]: Secondary | ICD-10-CM

## 2022-11-03 DIAGNOSIS — J189 Pneumonia, unspecified organism: Secondary | ICD-10-CM

## 2022-11-03 LAB — PSA: PSA: 5.15 ng/mL — ABNORMAL HIGH (ref 0.10–4.00)

## 2022-11-03 LAB — BASIC METABOLIC PANEL
BUN: 19 mg/dL (ref 6–23)
CO2: 28 mEq/L (ref 19–32)
Calcium: 9.3 mg/dL (ref 8.4–10.5)
Chloride: 102 mEq/L (ref 96–112)
Creatinine, Ser: 1.12 mg/dL (ref 0.40–1.50)
GFR: 68.45 mL/min (ref >60.00)
Glucose, Bld: 119 mg/dL — ABNORMAL HIGH (ref 70–99)
Potassium: 4.6 mEq/L (ref 3.5–5.1)
Sodium: 138 mEq/L (ref 135–145)

## 2022-11-03 LAB — CBC WITH DIFFERENTIAL/PLATELET
Basophils Absolute: 0.1 10*3/uL (ref 0.0–0.1)
Basophils Relative: 0.8 % (ref 0.0–3.0)
Eosinophils Absolute: 0.2 10*3/uL (ref 0.0–0.7)
Eosinophils Relative: 2 % (ref 0.0–5.0)
HCT: 32.9 % — ABNORMAL LOW (ref 39.0–52.0)
Hemoglobin: 11.1 g/dL — ABNORMAL LOW (ref 13.0–17.0)
Lymphocytes Relative: 12 % (ref 12.0–46.0)
Lymphs Abs: 1 10*3/uL (ref 0.7–4.0)
MCHC: 33.7 g/dL (ref 30.0–36.0)
MCV: 94.3 fl (ref 78.0–100.0)
Monocytes Absolute: 0.5 10*3/uL (ref 0.1–1.0)
Monocytes Relative: 5.7 % (ref 3.0–12.0)
Neutro Abs: 6.9 10*3/uL (ref 1.4–7.7)
Neutrophils Relative %: 79.5 % — ABNORMAL HIGH (ref 43.0–77.0)
Platelets: 388 10*3/uL (ref 150.0–400.0)
RBC: 3.48 Mil/uL — ABNORMAL LOW (ref 4.22–5.81)
RDW: 14.1 % (ref 11.5–15.5)
WBC: 8.7 10*3/uL (ref 4.0–10.5)

## 2022-11-03 MED ORDER — LEVOTHYROXINE SODIUM 75 MCG PO TABS
75.0000 ug | ORAL_TABLET | Freq: Every day | ORAL | 3 refills | Status: DC
  Filled 2022-11-03: qty 90, 90d supply, fill #0
  Filled 2022-12-28 – 2023-01-25 (×2): qty 90, 90d supply, fill #1
  Filled 2023-04-19: qty 90, 90d supply, fill #2
  Filled 2023-05-11 – 2023-07-15 (×2): qty 90, 90d supply, fill #3

## 2022-11-03 MED ORDER — AMOXICILLIN-POT CLAVULANATE 875-125 MG PO TABS
1.0000 | ORAL_TABLET | Freq: Two times a day (BID) | ORAL | 0 refills | Status: AC
  Filled 2022-11-03: qty 20, 10d supply, fill #0

## 2022-11-03 NOTE — Telephone Encounter (Signed)
Referral placed.

## 2022-11-03 NOTE — Progress Notes (Signed)
Subjective:    Patient ID: Tyler Hendrix, male    DOB: 1955-03-04, 67 y.o.   MRN: [id number]  HPI Here for a cough that started about 10 weeks ago. The cough produces sputum that ranges from green to yellow to clear in color. No blood in the sputum. No fever or SOB or chest pain. He had a pacemaker lead extraction on 10-20-22 per Dr. Ladona Ridgel, and a CXR the next day showed an opacity in the LUL. A follow up CXR on 10-30-22 again showed this opacity. No antibiotics have been prescribed to him. He has never smoked, but he has a hx of Hodgkin's lymphoma. He continues to cough and he is hoarse most of the time. He feels fatigued as well. He has CHF but he has no ankle edema. He also mentions the elevated PSA of 4.71 we found on 08-19-22. He has an appt to see Dr. Marlou Porch in Urology in late October for this.    Review of Systems  Constitutional:  Positive for fatigue. Negative for diaphoresis and fever.  HENT:  Positive for voice change. Negative for postnasal drip, sinus pressure and sore throat.   Respiratory:  Positive for cough. Negative for shortness of breath and wheezing.   Cardiovascular: Negative.        Objective:   Physical Exam Constitutional:      Appearance: He is not ill-appearing.     Comments: He coughs frequently and he is hoarse   HENT:     Right Ear: Tympanic membrane, ear canal and external ear normal.     Left Ear: Tympanic membrane, ear canal and external ear normal.     Nose: Nose normal.     Mouth/Throat:     Pharynx: Oropharynx is clear.  Eyes:     Conjunctiva/sclera: Conjunctivae normal.  Cardiovascular:     Rate and Rhythm: Normal rate and regular rhythm.     Pulses: Normal pulses.     Heart sounds: Normal heart sounds.  Pulmonary:     Effort: Pulmonary effort is normal.     Breath sounds: No wheezing, rhonchi or rales.     Comments: His breath sounds reveal some LUL consolidation  Musculoskeletal:     Right lower leg: No edema.     Left lower leg: No  edema.  Lymphadenopathy:     Cervical: No cervical adenopathy.  Neurological:     Mental Status: He is alert.           Assessment & Plan:  Chronic cough, with a LUL opacity on CXR. We will treat with 10 days of Augmentin. Check a CBC and BMET today. Schedule a chest CT in the next 2 weeks. We spent a total of (31   ) minutes reviewing records and discussing these issues. We will recheck a PSA today also.  Gershon Crane, MD

## 2022-11-05 ENCOUNTER — Telehealth: Payer: Self-pay | Admitting: Family Medicine

## 2022-11-05 ENCOUNTER — Ambulatory Visit: Payer: 59

## 2022-11-05 NOTE — Telephone Encounter (Signed)
Pt returned call

## 2022-11-11 DIAGNOSIS — C3490 Malignant neoplasm of unspecified part of unspecified bronchus or lung: Secondary | ICD-10-CM

## 2022-11-11 HISTORY — DX: Malignant neoplasm of unspecified part of unspecified bronchus or lung: C34.90

## 2022-11-12 ENCOUNTER — Other Ambulatory Visit: Payer: Self-pay

## 2022-11-17 ENCOUNTER — Ambulatory Visit: Payer: Self-pay | Admitting: Surgery

## 2022-11-17 ENCOUNTER — Ambulatory Visit (INDEPENDENT_AMBULATORY_CARE_PROVIDER_SITE_OTHER): Payer: 59 | Admitting: Internal Medicine

## 2022-11-17 ENCOUNTER — Encounter: Payer: Self-pay | Admitting: Internal Medicine

## 2022-11-17 VITALS — BP 128/80 | HR 82 | Resp 20 | Ht 71.0 in | Wt 177.6 lb

## 2022-11-17 DIAGNOSIS — E039 Hypothyroidism, unspecified: Secondary | ICD-10-CM

## 2022-11-17 NOTE — H&P (Signed)
Tyler Hendrix   Referring Provider:  Dwaine Deter, MD   Subjective   Chief Complaint: New Consultation (bilateral inguinal hernia)     History of Present Illness:    This is a very pleasant 67 year old male with history of CAD status post CABG x 2, mitral valve replacement, A-fib/pleat heart block status post pacemaker subsequently upgraded to BiV, hypothyroidism, Hodgkin's disease status post radiation and chemo (one of the great sources for his cardiac issues now), chronic combined systolic and diastolic congestive heart failure (EF 30-35% Sept 2023), and arthritis presents for evaluation of bilateral inguinal hernias.  No previous abdominal surgery, but he did just have a left femoral IM nail for femur fracture sustained while doing yard work in the last few months).  He has upcoming lead replacement of his BiV pacer at the end of this month.  He noted bulges in bilateral groins a few months ago, these are asymptomatic at this point.  He is an P[employer] consultant.  He is fairly active, now especially with the physical therapy for his leg.  He does note that he lost about 14 pounds and had appetite issues after the femur fracture, which seems to be getting better.  Review of Systems: A complete review of systems was obtained from the patient.  I have reviewed this information and discussed as appropriate with the patient.  See HPI as well for other ROS.   Medical History: Past Medical History:  Diagnosis Date   Arrhythmia    Arthritis    CHF (congestive heart failure) (CMS/HHS-HCC)    Heart valve disease    History of cancer    Hypertension    Thyroid disease     There is no problem list on file for this patient.   Past Surgical History:  Procedure Laterality Date   A-flutter ablation     back surgery     Cardiac catheterization     Cardioversion     Coronary artery bypass graft surgery     pacemaker insertion surgery     REPLACEMENT MITRAL VALVE     right knee  surgery       No Known Allergies  Current Outpatient Medications on File Prior to Visit  Medication Sig Dispense Refill   aspirin 81 MG EC tablet Take 81 mg by mouth once daily     atorvastatin (LIPITOR) 40 MG tablet Take 40 mg by mouth once daily     carvediloL (COREG) 6.25 MG tablet Take 6.25 mg by mouth 2 (two) times daily with meals     cholecalciferol, vitamin D3, (VITAMIN D3) 125 mcg (5,000 unit) tablet Take 1 tablet by mouth once daily     ENTRESTO 24-26 mg tablet Take 1 tablet by mouth 2 (two) times daily     levothyroxine (SYNTHROID) 75 MCG tablet Take 75 mcg by mouth once daily     multivitamin with minerals tablet Take 1 tablet by mouth once daily     spironolactone (ALDACTONE) 25 MG tablet Take 12.5 mg by mouth once daily     traZODone (DESYREL) 50 MG tablet Take 50 mg by mouth at bedtime     No current facility-administered medications on file prior to visit.    Family History  Problem Relation Age of Onset   Stroke Mother    Coronary Artery Disease (Blocked arteries around heart) Mother    Skin cancer Father    Coronary Artery Disease (Blocked arteries around heart) Father    Obesity Sister  Skin cancer Brother      Social History   Tobacco Use  Smoking Status Never  Smokeless Tobacco Never     Social History   Socioeconomic History   Marital status: Married  Tobacco Use   Smoking status: Never   Smokeless tobacco: Never  Vaping Use   Vaping status: Never Used  Substance and Sexual Activity   Alcohol use: Yes   Drug use: Never   Social Determinants of Health   Food Insecurity: No Food Insecurity (06/30/2022)   Received from [facility]   Hunger Vital Sign    Worried About Running Out of Food in the Last Year: Never true    Ran Out of Food in the Last Year: Never true  Transportation Needs: No Transportation Needs (06/30/2022)   Received from [facility]    Lack of Transportation (Medical): No    Lack of  Transportation (Non-Medical): No    Objective:    Vitals:   09/11/22 1001  BP: 122/76  Pulse: (!) 113  Temp: 36.7 C (98 F)  SpO2: 98%  Weight: 81.3 kg (179 lb 3.2 oz)  Height: 179.1 cm (5' 10.5")  PainSc: 0-No pain    Body mass index is 25.35 kg/m.  Gen: A&Ox3, no distress  Unlabored respirations There are bilateral likely direct inguinal hernias which are easily reducible  Assessment and Plan:  Diagnoses and all orders for this visit:  Non-recurrent bilateral inguinal hernia without obstruction or gangrene    We discussed the relevant anatomy and we discussed options for repair.  I recommend a robotic approach and went over the technique of the procedure.  Discussed risks of bleeding, infection, pain, scarring, injury to structures in the area including nerves, blood vessels, bowel, bladder, risk of chronic pain, hernia recurrence, risk of seroma or hematoma, urinary retention, and risks of general anesthesia including cardiovascular, pulmonary, and thromboembolic complications.  We discussed typical postop recovery, timeline, and activity limitations.  We also discussed the option of ongoing observation, with high rate of ultimately returning for surgery and risk of increasing size/symptoms from the hernia as well as incarceration/strangulation and went over symptoms that should prompt him to seek emergency treatment.  Questions were come and answered to the patient's satisfaction.  Given his cardiac issues and upcoming procedure, I recommend and he prefers to defer hernia surgery until that is done and he has somewhat recovered.  Additionally discussed that we will request cardiac clearance prior to this surgery.  Patient wishes to proceed with scheduling and anticipate surgery in the fall.     Carrel Leather Carlye Grippe, MD

## 2022-11-17 NOTE — Patient Instructions (Addendum)
Please continue Levothyroxine 75 mcg daily.  Take the thyroid hormone every day, with water, at least 30 minutes before breakfast, separated by at least 4 hours from: - acid reflux medications - calcium - iron - multivitamins  Please come back for labs in 1.5 months.  Please come back for a follow-up appointment in 1 year.

## 2022-11-17 NOTE — Progress Notes (Signed)
Patient ID: Tyler Hendrix, male   DOB: December 13, 1955, 67 y.o.   MRN: [id number]   HPI  Tyler Hendrix is a 67 y.o.-year-old male, returning for follow-up for hypothyroidism.  Last visit 1.5 years ago.  Interim history: He had a femoral fracture in 06/2022 - had an intramedullary rod placed. He had a pacemaker implanted 10/2022. He has hoarseness and had productive sputum, which has cleared up. He was found to have a LUL opacity on CXR that was initially checked for pacemaker placement >> has a CT scan scheduled tomorrow. He was treated with ABx.  He will have B inguinal hernia sx 12/2022. He had a high PSA since last visit >> will see urology.  Reviewed history: Pt. has been dx with hypothyroidism in ~2010. He has been found to have a high TSH during investigation for AVB.  He started to feel better after starting levothyroxine.  Reviewed his TFTs: Lab Results  Component Value Date   TSH 4.18 08/19/2022   TSH 3.95 07/05/2021   TSH 2.49 05/24/2021   TSH 3.40 05/18/2020   TSH 2.84 05/18/2019   TSH 4.17 03/30/2019   TSH 4.630 (H) 03/27/2018   TSH 2.50 05/12/2017   TSH 4.10 09/11/2016   TSH 5.55 (H) 07/03/2016   FREET4 0.87 08/19/2022   FREET4 0.99 05/24/2021   FREET4 0.84 05/18/2020   FREET4 0.86 05/18/2019   FREET4 0.92 03/30/2019   FREET4 0.96 05/12/2017   FREET4 1.14 09/11/2016   FREET4 0.79 07/03/2016   FREET4 0.87 04/16/2016   FREET4 1.0 01/01/2016    Pt is on levothyroxine 75 mcg daily, taken: - in am (7 am)  - fasting - at least 30 min from b'fast - no Ca, Fe, PPIs - + MVI and herbal + mineral suplements in pm >> in am, ~30 min after b'fast - not on Biotin  Pt denies: - feeling nodules in neck - hoarseness - dysphagia - choking  Of note, he does have a history of radiation treatment in the head and neck area in 2002. No FH of thyroid disease or thyroid cancer.  + herbal + mineral supplements. No Biotin use. No recent steroids use.   Pt. also has a history  of RBBB, then AVB in 2017 >> had a pacemaker placed. He had RxTx and ChTx in 2002 for Hodgkin Lymphoma. He had MV replacement, CABG 01/2018, then admitted with acute on chronic CHF on 03/27/2018. EF 25-30%.   He has a history of A. fib/a flutter and had cardioversions in the past.  He has a biventricular pacemaker. 2D Echo 2021: 30-35%, prev. 40% in 2020.  ROS: + see HPI  I reviewed pt's medications, allergies, PMH, social hx, family hx, and changes were documented in the history of present illness. Otherwise, unchanged from my initial visit note.  Past Medical History:  Diagnosis Date   Abnormal TSH    Arthritis    ddd   Atrial fibrillation and flutter (HCC)    s/p DCCV 03/26/2018   CAD in native artery    a. 11/2015: s/p 2 vessel PCI of the first diagonal branch of the LAD in the mid left circumflex with DES. // s/p CABG 01/2018   Chronic combined systolic and diastolic CHF (congestive heart failure) (HCC)    sees Dr. Johney Frame    Complete heart block (HCC)    a. s/p Medtronic ppm 10/2015.   Dyspnea    with exertion   History of radiation therapy 2002   Hodgkin disease (  HCC) 2002   a. s/p chest radiation and chemotherapy   Hypertension    Hypothyroidism    sees Dr. Elvera Lennox    Incidental pulmonary nodule 01/19/2018   Left apical opacity - possible scarring   Inguinal hernia 10/14/2022   bilateral   Mitral regurgitation    s/p bioprosthetic MV replacement 01/2018   Pacemaker 10/2015   Medtronic   PVC's (premature ventricular contractions)    Right bundle branch block    S/P CABG x 2 01/27/2018   LIMA to LAD, SVG to RCA, EVH via right thigh   S/P mitral valve replacement with bioprosthetic valve 01/27/2018   29 mm [facility] Mitral stented bovine pericardial tissue valve   Past Surgical History:  Procedure Laterality Date   A-FLUTTER ABLATION N/A 06/20/2021   Procedure: A-FLUTTER ABLATION;  Surgeon: Marinus Maw, MD;  Location: [facility] INVASIVE CV LAB;  Service:  Cardiovascular;  Laterality: N/A;   BIV UPGRADE N/A 02/08/2019   Procedure: UPGRADE TO BIV PPM;  Surgeon: Hillis Range, MD;  Location: [facility] INVASIVE CV LAB;  Service: Cardiovascular;  Laterality: N/A;   CARDIAC CATHETERIZATION N/A 11/02/2015   Procedure: Left Heart Cath and Coronary Angiography;  Surgeon: Tonny Bollman, MD;  Location: [facility] INVASIVE CV LAB;  Service: Cardiovascular;  Laterality: N/A;   CARDIAC CATHETERIZATION N/A 11/21/2015   Procedure: Coronary Stent Intervention;  Surgeon: Tonny Bollman, MD;  Location: [facility] INVASIVE CV LAB;  Service: Cardiovascular;  Laterality: N/A;   CARDIAC CATHETERIZATION  11/2017   CARDIOVERSION N/A 03/26/2018   Procedure: CARDIOVERSION;  Surgeon: Parke Poisson, MD;  Location: [facility] ENDOSCOPY;  Service: Cardiovascular;  Laterality: N/A;   COLONOSCOPY  01/16/2015   per Dr. Christella Hartigan, benign polyps, repeat in 10 yrs    CORONARY ARTERY BYPASS GRAFT N/A 01/27/2018   Procedure: CORONARY ARTERY BYPASS GRAFTING (CABG) x two, using left internal mammary artery and right leg greater saphenous vein harvested endoscopically;  Surgeon: Purcell Nails, MD;  Location: [facility] OR;  Service: Open Heart Surgery;  Laterality: N/A;   EP IMPLANTABLE DEVICE N/A 11/02/2015   MDT Adivisa MRI conditional dual chamber pacemaker implanted by Dr Johney Frame for complete heart block   FEMUR IM NAIL Left 07/01/2022   Procedure: INTRAMEDULLARY (IM) NAIL FEMORAL LEFT ANTEGRADE;  Surgeon: Myrene Galas, MD;  Location: [facility] OR;  Service: Orthopedics;  Laterality: Left;   KNEE SURGERY Right 1972   LEAD EXTRACTION N/A 10/20/2022   Procedure: LEAD EXTRACTION;  Surgeon: Marinus Maw, MD;  Location: [facility] INVASIVE CV LAB;  Service: Cardiovascular;  Laterality: N/A;   LUMBAR LAMINECTOMY  1996   MITRAL VALVE REPLACEMENT N/A 01/27/2018   Procedure: MITRAL VALVE (MV) REPLACEMENT using Magna Mitral Ease Valve Size ;  Surgeon: Purcell Nails, MD;  Location: [facility] OR;  Service: Open Heart Surgery;  Laterality:  N/A;   RIGHT/LEFT HEART CATH AND CORONARY ANGIOGRAPHY N/A 12/02/2017   Procedure: RIGHT/LEFT HEART CATH AND CORONARY ANGIOGRAPHY;  Surgeon: Corky Crafts, MD;  Location: [facility] INVASIVE CV LAB;  Service: Cardiovascular;  Laterality: N/A;   TEE WITHOUT CARDIOVERSION N/A 12/09/2017   Procedure: TRANSESOPHAGEAL ECHOCARDIOGRAM (TEE);  Surgeon: Jodelle Red, MD;  Location: [facility] ENDOSCOPY;  Service: Cardiovascular;  Laterality: N/A;   TEE WITHOUT CARDIOVERSION N/A 01/27/2018   Procedure: TRANSESOPHAGEAL ECHOCARDIOGRAM (TEE);  Surgeon: Purcell Nails, MD;  Location: [facility] OR;  Service: Open Heart Surgery;  Laterality: N/A;   Social History   Socioeconomic History   Marital status: Married    Spouse name: Not on file  Number of children: 3   Years of education: Masters   Highest education level: Not on file  Occupational History   Occupation: lawyer    Comment: [employer]  Tobacco Use   Smoking status: Never   Smokeless tobacco: Never  Vaping Use   Vaping status: Never Used  Substance and Sexual Activity   Alcohol use: Yes    Alcohol/week: 3.0 standard drinks of alcohol    Types: 3 Glasses of wine per week   Drug use: No   Sexual activity: Not on file  Other Topics Concern   Not on file  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[place]   Social Determinants of Health   Financial Resource Strain: Not on file  Food Insecurity: No Food Insecurity (10/20/2022)   Hunger Vital Sign    Worried About Running Out of Food in the Last Year: Never true    Ran Out of Food in the Last Year: Never true  Transportation Needs: No Transportation Needs (10/20/2022)   PRAPARE - Administrator, Civil Service (Medical): No    Lack of Transportation (Non-Medical): No  Physical Activity: Not on file  Stress: Not on file  Social Connections: Not on file  Intimate Partner Violence: Not At Risk (10/20/2022)   Humiliation, Afraid, Rape, and Kick  questionnaire    Fear of Current or Ex-Partner: No    Emotionally Abused: No    Physically Abused: No    Sexually Abused: No   Current Outpatient Medications on File Prior to Visit  Medication Sig Dispense Refill   aspirin EC 81 MG tablet Take 81 mg by mouth daily.     atorvastatin (LIPITOR) 40 MG tablet Take 1 tablet (40 mg total) by mouth daily. 90 tablet 3   carvedilol (COREG) 6.25 MG tablet Take 1 tablet (6.25 mg total) by mouth 2 (two) times daily with a meal. 180 tablet 3   Cholecalciferol 125 MCG (5000 UT) TABS Take 1 tablet by mouth daily. 30 tablet 6   levothyroxine (SYNTHROID) 75 MCG tablet Take 1 tablet (75 mcg total) by mouth daily before breakfast. 90 tablet 3   Multiple Vitamin (MULTIVITAMIN WITH MINERALS) TABS tablet Take 1 tablet by mouth daily.     sacubitril-valsartan (ENTRESTO) 24-26 MG Take 1 tablet by mouth 2 (two) times daily. 180 tablet 3   sildenafil (VIAGRA) 50 MG tablet Take 1 tablet (50 mg total) by mouth daily as needed for erectile dysfunction. 30 tablet 3   spironolactone (ALDACTONE) 25 MG tablet Take one-half tablet (12.5 mg total) by mouth daily. 45 tablet 3   traZODone (DESYREL) 50 MG tablet Take 1 tablet (50 mg total) by mouth at bedtime. (Patient taking differently: Take 50-75 mg by mouth at bedtime.) 90 tablet 3   No current facility-administered medications on file prior to visit.   Allergies  Allergen Reactions   Other     Pt reported allergic to dust mites that causes of sneezing, runny nose   Family History  Problem Relation Age of Onset   Cancer Other        prostate father hx   Heart disease Mother    Heart disease Father    Colon cancer Neg Hx    PE: BP 128/80 (BP Location: Right Arm, Patient Position: Sitting, Cuff Size: Normal)   Pulse 82   Resp 20   Ht 5\' 11"  (1.803 m)   Wt 177 lb 9.6 oz (80.6 kg)  SpO2 98%   BMI 24.77 kg/m  Wt Readings from Last 3 Encounters:  11/17/22 177 lb 9.6 oz (80.6 kg)  11/03/22 177 lb (80.3 kg)   10/20/22 180 lb (81.6 kg)   Constitutional: overweight, in NAD Eyes:  EOMI, no exophthalmos ENT: no neck masses, no cervical lymphadenopathy Cardiovascular: RRR, No MRG Respiratory: CTA B but change (more acute tonality) of breath sounds in the left upper lobe Musculoskeletal: no deformities Skin:no rashes Neurological: no tremor with outstretched hands  ASSESSMENT: 1.  Acquired hypothyroidism  PLAN:  1. Patient with longstanding hypothyroidism, on levothyroxine - latest thyroid labs reviewed with pt. >> normal: Lab Results  Component Value Date   TSH 4.18 08/19/2022  - he continues on LT4 75 mcg daily - pt feels good on this dose. - we discussed about taking the thyroid hormone every day, with water, >30 minutes before breakfast, separated by >4 hours from acid reflux medications, calcium, iron, multivitamins. Pt. is taking it correctly now.  At last visit, he was sometimes taking it around 1 to 2 AM, which could have been too close to dinner.  I advised him to try to take it after 3 AM.  He is currently taking it at 7 AM.  However, he moved his multivitamins from afternoon to morning, separated by approximately 1-2 hour from breakfast.  I advised him to move these at least 4 hours later. - will check thyroid tests in 1.5 months after the above change: TSH and fT4 - He will need refills at that time - I will see him back in a year.  Orders Placed This Encounter  Procedures   TSH   T4, free   Carlus Pavlov, MD PhD [facility]

## 2022-11-18 ENCOUNTER — Inpatient Hospital Stay
Admission: RE | Admit: 2022-11-18 | Discharge: 2022-11-18 | Disposition: A | Discharge status: Home / Self Care | Payer: 59 | Source: Ambulatory Visit | Attending: Family Medicine | Admitting: Family Medicine

## 2022-11-18 DIAGNOSIS — C3412 Malignant neoplasm of upper lobe, left bronchus or lung: Secondary | ICD-10-CM | POA: Diagnosis not present

## 2022-11-18 DIAGNOSIS — R053 Chronic cough: Secondary | ICD-10-CM

## 2022-11-18 DIAGNOSIS — C781 Secondary malignant neoplasm of mediastinum: Secondary | ICD-10-CM | POA: Diagnosis not present

## 2022-11-18 MED ORDER — IOPAMIDOL (ISOVUE-300) INJECTION 61%
75.0000 mL | Freq: Once | INTRAVENOUS | Status: AC | PRN
  Administered 2022-11-18: 75 mL via INTRAVENOUS

## 2022-11-19 ENCOUNTER — Other Ambulatory Visit: Payer: Self-pay

## 2022-11-19 DIAGNOSIS — S72302D Unspecified fracture of shaft of left femur, subsequent encounter for closed fracture with routine healing: Secondary | ICD-10-CM | POA: Diagnosis not present

## 2022-11-20 ENCOUNTER — Encounter: Payer: Self-pay | Admitting: Family Medicine

## 2022-11-21 NOTE — Telephone Encounter (Signed)
Pt is aware md worked half day on Thursday and sometimes is take a wk or so before results or back

## 2022-11-25 NOTE — Telephone Encounter (Signed)
There is a nodule slot for tomorrow that could be used  I called the pt and there was no answer- LMTCB

## 2022-11-25 NOTE — Telephone Encounter (Signed)
Please confirm that we are working on getting this patient in to be seen -   OK to use any of my blocked slots. Thank you

## 2022-11-26 ENCOUNTER — Encounter: Payer: Self-pay | Admitting: Emergency Medicine

## 2022-11-26 ENCOUNTER — Ambulatory Visit (INDEPENDENT_AMBULATORY_CARE_PROVIDER_SITE_OTHER): Payer: 59 | Admitting: Emergency Medicine

## 2022-11-26 VITALS — BP 112/61 | HR 54 | Temp 97.6°F | Ht 71.0 in | Wt 181.2 lb

## 2022-11-26 DIAGNOSIS — R918 Other nonspecific abnormal finding of lung field: Secondary | ICD-10-CM | POA: Diagnosis not present

## 2022-11-26 NOTE — Progress Notes (Signed)
Subjective:    Patient ID: Tyler Hendrix, male    DOB: 11/19/1955, 67 y.o.   MRN: [id number]  HPI 67 year old man, never smoker, with a history of CAD/CABG and pacemaker for complete heart block.  Atrial fibrillation/flutter with a prior DCCV, hypertension, mitral regurgitation with a mechanical mitral valve, Hodgkin's lymphoma treated with chest radiation and chemotherapy.  He is referred today for unresolving cough that began about 3 months ago.  He reports that he may have had a URI in the beginning that started it. Still happens w deep breathing. Has UA irritation and some vocal changed, hoarseness.  Part of his evaluation has included chest x-rays initially following a pacemaker lead exchange on 10/20/2022 that showed a left upper lobe opacity, still present 10 days later.  He was treated with Augmentin for 10 days in late September. He is still having the cough   CT chest 11/18/2022 reviewed by me shows a left suprahilar masslike opacity lateral to the left pulmonary artery with some slight mixed density, question central clearing.  I do not see any other lymphadenopathy in the chest.  The official read is pending   Review of Systems As per HPI  Past Medical History:  Diagnosis Date   Abnormal TSH    Arthritis    ddd   Atrial fibrillation and flutter (HCC)    s/p DCCV 03/26/2018   CAD in native artery    a. 11/2015: s/p 2 vessel PCI of the first diagonal branch of the LAD in the mid left circumflex with DES. // s/p CABG 01/2018   Chronic combined systolic and diastolic CHF (congestive heart failure) (HCC)    sees Dr. Johney Frame    Complete heart block (HCC)    a. s/p Medtronic ppm 10/2015.   Dyspnea    with exertion   History of radiation therapy 2002   Hodgkin disease (HCC) 2002   a. s/p chest radiation and chemotherapy   Hypertension    Hypothyroidism    sees Dr. Elvera Lennox    Incidental pulmonary nodule 01/19/2018   Left apical opacity - possible scarring   Inguinal hernia  10/14/2022   bilateral   Mitral regurgitation    s/p bioprosthetic MV replacement 01/2018   Pacemaker 10/2015   Medtronic   PVC's (premature ventricular contractions)    Right bundle branch block    S/P CABG x 2 01/27/2018   LIMA to LAD, SVG to RCA, EVH via right thigh   S/P mitral valve replacement with bioprosthetic valve 01/27/2018   29 mm [facility] Mitral stented bovine pericardial tissue valve     Family History  Problem Relation Age of Onset   Cancer Other        prostate father hx   Heart disease Mother    Heart disease Father    Colon cancer Neg Hx      Social History   Socioeconomic History   Marital status: Married    Spouse name: Not on file   Number of children: 3   Years of education: Masters   Highest education level: Not on file  Occupational History   Occupation: Clinical research associate    Comment: [employer]  Tobacco Use   Smoking status: Never   Smokeless tobacco: Never  Vaping Use   Vaping status: Never Used  Substance and Sexual Activity   Alcohol use: Yes    Alcohol/week: 3.0 standard drinks of alcohol    Types: 3 Glasses of wine per week  Drug use: No   Sexual activity: Not on file  Other Topics Concern   Not on file  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[place]   Social Determinants of Health   Financial Resource Strain: Not on file  Food Insecurity: No Food Insecurity (10/20/2022)   Hunger Vital Sign    Worried About Running Out of Food in the Last Year: Never true    Ran Out of Food in the Last Year: Never true  Transportation Needs: No Transportation Needs (10/20/2022)   PRAPARE - Administrator, Civil Service (Medical): No    Lack of Transportation (Non-Medical): No  Physical Activity: Not on file  Stress: Not on file  Social Connections: Not on file  Intimate Partner Violence: Not At Risk (10/20/2022)   Humiliation, Afraid, Rape, and Kick questionnaire    Fear of Current or Ex-Partner: No     Emotionally Abused: No    Physically Abused: No    Sexually Abused: No    Never smoker.  No inhaled exposures.  From [facility]  Allergies  Allergen Reactions   Other     Pt reported allergic to dust mites that causes of sneezing, runny nose     Outpatient Medications Prior to Visit  Medication Sig Dispense Refill   aspirin EC 81 MG tablet Take 81 mg by mouth daily.     atorvastatin (LIPITOR) 40 MG tablet Take 1 tablet (40 mg total) by mouth daily. 90 tablet 3   carvedilol (COREG) 6.25 MG tablet Take 1 tablet (6.25 mg total) by mouth 2 (two) times daily with a meal. 180 tablet 3   Cholecalciferol 125 MCG (5000 UT) TABS Take 1 tablet by mouth daily. 30 tablet 6   levothyroxine (SYNTHROID) 75 MCG tablet Take 1 tablet (75 mcg total) by mouth daily before breakfast. 90 tablet 3   Multiple Vitamin (MULTIVITAMIN WITH MINERALS) TABS tablet Take 1 tablet by mouth daily.     sacubitril-valsartan (ENTRESTO) 24-26 MG Take 1 tablet by mouth 2 (two) times daily. 180 tablet 3   sildenafil (VIAGRA) 50 MG tablet Take 1 tablet (50 mg total) by mouth daily as needed for erectile dysfunction. 30 tablet 3   traZODone (DESYREL) 50 MG tablet Take 1 tablet (50 mg total) by mouth at bedtime. (Patient taking differently: Take 50-75 mg by mouth at bedtime.) 90 tablet 3   spironolactone (ALDACTONE) 25 MG tablet Take one-half tablet (12.5 mg total) by mouth daily. (Patient not taking: Reported on 11/26/2022) 45 tablet 3   No facility-administered medications prior to visit.        Objective:   Physical Exam Vitals:   11/26/22 0945  BP: 112/61  Pulse: (!) 54  Temp: 97.6 F (36.4 C)  TempSrc: Temporal  SpO2: 100%  Weight: 181 lb 3.2 oz (82.2 kg)  Height: 5\' 11"  (1.803 m)   Gen: Pleasant, well-nourished, in no distress,  normal affect  ENT: No lesions,  mouth clear,  oropharynx clear, no postnasal drip  Neck: No JVD, no stridor  Lungs: No use of accessory muscles, no crackles or wheezing on  normal respiration, no wheeze on forced expiration  Cardiovascular: RRR, heart sounds normal, no murmur or gallops, no peripheral edema  Musculoskeletal: No deformities, no cyanosis or clubbing  Neuro: alert, awake, non focal  Skin: Warm, no lesions or rash       Assessment & Plan:  Lung mass Left upper lobe mass lesion in the left suprahilar region that  I do not see on the CT chest from 2020.  Concerning for possible malignancy.  Consider also resolving bacterial or atypical infection.  He has persistent cough.  We talked about the diagnostic options and we have decided to pursue navigational bronchoscopy.  I will also order a PET scan to better characterize and to look for any evidence of associated disease.  I will try to get this set up for 12/02/2022.   Levy Pupa, MD, PhD 11/26/2022, 10:25 AM [facility] [phone] or if no answer before 7:00PM call [phone] For any issues after 7:00PM please call eLink [phone]

## 2022-11-26 NOTE — Patient Instructions (Signed)
We reviewed your CT scan of the chest today. We will arrange for bronchoscopy to evaluate a left upper lobe opacity noted on your CT scan.  This would be done as an outpatient under general anesthesia at [facility] endoscopy.  You will need to stop your aspirin 2 days prior.  You will need a designated driver and someone to watch you at home on that day after the procedure.  Will try to get this set up for 12/02/2022 or 12/01/2022. We will arrange for a PET scan Please follow with either Dr. Delton Coombes or Andrey Campanile, NP.  in about 2 weeks or next available

## 2022-11-26 NOTE — Assessment & Plan Note (Signed)
Left upper lobe mass lesion in the left suprahilar region that I do not see on the CT chest from 2020.  Concerning for possible malignancy.  Consider also resolving bacterial or atypical infection.  He has persistent cough.  We talked about the diagnostic options and we have decided to pursue navigational bronchoscopy.  I will also order a PET scan to better characterize and to look for any evidence of associated disease.  I will try to get this set up for 12/02/2022.

## 2022-11-26 NOTE — H&P (View-Only) (Signed)
Subjective:    Patient ID: Tyler Hendrix, male    DOB: 11/19/1955, 67 y.o.   MRN: [id number]  HPI 67 year old man, never smoker, with a history of CAD/CABG and pacemaker for complete heart block.  Atrial fibrillation/flutter with a prior DCCV, hypertension, mitral regurgitation with a mechanical mitral valve, Hodgkin's lymphoma treated with chest radiation and chemotherapy.  He is referred today for unresolving cough that began about 3 months ago.  He reports that he may have had a URI in the beginning that started it. Still happens w deep breathing. Has UA irritation and some vocal changed, hoarseness.  Part of his evaluation has included chest x-rays initially following a pacemaker lead exchange on 10/20/2022 that showed a left upper lobe opacity, still present 10 days later.  He was treated with Augmentin for 10 days in late September. He is still having the cough   CT chest 11/18/2022 reviewed by me shows a left suprahilar masslike opacity lateral to the left pulmonary artery with some slight mixed density, question central clearing.  I do not see any other lymphadenopathy in the chest.  The official read is pending   Review of Systems As per HPI  Past Medical History:  Diagnosis Date   Abnormal TSH    Arthritis    ddd   Atrial fibrillation and flutter (HCC)    s/p DCCV 03/26/2018   CAD in native artery    a. 11/2015: s/p 2 vessel PCI of the first diagonal branch of the LAD in the mid left circumflex with DES. // s/p CABG 01/2018   Chronic combined systolic and diastolic CHF (congestive heart failure) (HCC)    sees Dr. Johney Frame    Complete heart block (HCC)    a. s/p Medtronic ppm 10/2015.   Dyspnea    with exertion   History of radiation therapy 2002   Hodgkin disease (HCC) 2002   a. s/p chest radiation and chemotherapy   Hypertension    Hypothyroidism    sees Dr. Elvera Lennox    Incidental pulmonary nodule 01/19/2018   Left apical opacity - possible scarring   Inguinal hernia  10/14/2022   bilateral   Mitral regurgitation    s/p bioprosthetic MV replacement 01/2018   Pacemaker 10/2015   Medtronic   PVC's (premature ventricular contractions)    Right bundle branch block    S/P CABG x 2 01/27/2018   LIMA to LAD, SVG to RCA, EVH via right thigh   S/P mitral valve replacement with bioprosthetic valve 01/27/2018   29 mm [facility] Mitral stented bovine pericardial tissue valve     Family History  Problem Relation Age of Onset   Cancer Other        prostate father hx   Heart disease Mother    Heart disease Father    Colon cancer Neg Hx      Social History   Socioeconomic History   Marital status: Married    Spouse name: Not on file   Number of children: 3   Years of education: Masters   Highest education level: Not on file  Occupational History   Occupation: Clinical research associate    Comment: [employer]  Tobacco Use   Smoking status: Never   Smokeless tobacco: Never  Vaping Use   Vaping status: Never Used  Substance and Sexual Activity   Alcohol use: Yes    Alcohol/week: 3.0 standard drinks of alcohol    Types: 3 Glasses of wine per week  Drug use: No   Sexual activity: Not on file  Other Topics Concern   Not on file  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[place]   Social Determinants of Health   Financial Resource Strain: Not on file  Food Insecurity: No Food Insecurity (10/20/2022)   Hunger Vital Sign    Worried About Running Out of Food in the Last Year: Never true    Ran Out of Food in the Last Year: Never true  Transportation Needs: No Transportation Needs (10/20/2022)   PRAPARE - Administrator, Civil Service (Medical): No    Lack of Transportation (Non-Medical): No  Physical Activity: Not on file  Stress: Not on file  Social Connections: Not on file  Intimate Partner Violence: Not At Risk (10/20/2022)   Humiliation, Afraid, Rape, and Kick questionnaire    Fear of Current or Ex-Partner: No     Emotionally Abused: No    Physically Abused: No    Sexually Abused: No    Never smoker.  No inhaled exposures.  From [facility]  Allergies  Allergen Reactions   Other     Pt reported allergic to dust mites that causes of sneezing, runny nose     Outpatient Medications Prior to Visit  Medication Sig Dispense Refill   aspirin EC 81 MG tablet Take 81 mg by mouth daily.     atorvastatin (LIPITOR) 40 MG tablet Take 1 tablet (40 mg total) by mouth daily. 90 tablet 3   carvedilol (COREG) 6.25 MG tablet Take 1 tablet (6.25 mg total) by mouth 2 (two) times daily with a meal. 180 tablet 3   Cholecalciferol 125 MCG (5000 UT) TABS Take 1 tablet by mouth daily. 30 tablet 6   levothyroxine (SYNTHROID) 75 MCG tablet Take 1 tablet (75 mcg total) by mouth daily before breakfast. 90 tablet 3   Multiple Vitamin (MULTIVITAMIN WITH MINERALS) TABS tablet Take 1 tablet by mouth daily.     sacubitril-valsartan (ENTRESTO) 24-26 MG Take 1 tablet by mouth 2 (two) times daily. 180 tablet 3   sildenafil (VIAGRA) 50 MG tablet Take 1 tablet (50 mg total) by mouth daily as needed for erectile dysfunction. 30 tablet 3   traZODone (DESYREL) 50 MG tablet Take 1 tablet (50 mg total) by mouth at bedtime. (Patient taking differently: Take 50-75 mg by mouth at bedtime.) 90 tablet 3   spironolactone (ALDACTONE) 25 MG tablet Take one-half tablet (12.5 mg total) by mouth daily. (Patient not taking: Reported on 11/26/2022) 45 tablet 3   No facility-administered medications prior to visit.        Objective:   Physical Exam Vitals:   11/26/22 0945  BP: 112/61  Pulse: (!) 54  Temp: 97.6 F (36.4 C)  TempSrc: Temporal  SpO2: 100%  Weight: 181 lb 3.2 oz (82.2 kg)  Height: 5\' 11"  (1.803 m)   Gen: Pleasant, well-nourished, in no distress,  normal affect  ENT: No lesions,  mouth clear,  oropharynx clear, no postnasal drip  Neck: No JVD, no stridor  Lungs: No use of accessory muscles, no crackles or wheezing on  normal respiration, no wheeze on forced expiration  Cardiovascular: RRR, heart sounds normal, no murmur or gallops, no peripheral edema  Musculoskeletal: No deformities, no cyanosis or clubbing  Neuro: alert, awake, non focal  Skin: Warm, no lesions or rash       Assessment & Plan:  Lung mass Left upper lobe mass lesion in the left suprahilar region that  I do not see on the CT chest from 2020.  Concerning for possible malignancy.  Consider also resolving bacterial or atypical infection.  He has persistent cough.  We talked about the diagnostic options and we have decided to pursue navigational bronchoscopy.  I will also order a PET scan to better characterize and to look for any evidence of associated disease.  I will try to get this set up for 12/02/2022.   Levy Pupa, MD, PhD 11/26/2022, 10:25 AM [facility] [phone] or if no answer before 7:00PM call [phone] For any issues after 7:00PM please call eLink [phone]

## 2022-11-26 NOTE — Telephone Encounter (Signed)
Pt was seen 11/26/22

## 2022-12-01 ENCOUNTER — Other Ambulatory Visit: Payer: Self-pay

## 2022-12-01 ENCOUNTER — Other Ambulatory Visit (HOSPITAL_COMMUNITY): Payer: Self-pay

## 2022-12-01 ENCOUNTER — Encounter (HOSPITAL_COMMUNITY): Payer: Self-pay | Admitting: Emergency Medicine

## 2022-12-01 ENCOUNTER — Encounter: Payer: Self-pay | Admitting: Internal Medicine

## 2022-12-01 DIAGNOSIS — Z125 Encounter for screening for malignant neoplasm of prostate: Secondary | ICD-10-CM | POA: Diagnosis not present

## 2022-12-01 DIAGNOSIS — R3912 Poor urinary stream: Secondary | ICD-10-CM | POA: Diagnosis not present

## 2022-12-01 DIAGNOSIS — N5201 Erectile dysfunction due to arterial insufficiency: Secondary | ICD-10-CM | POA: Diagnosis not present

## 2022-12-01 DIAGNOSIS — N401 Enlarged prostate with lower urinary tract symptoms: Secondary | ICD-10-CM | POA: Diagnosis not present

## 2022-12-01 MED ORDER — TAMSULOSIN HCL 0.4 MG PO CAPS
0.4000 mg | ORAL_CAPSULE | Freq: Every day | ORAL | 1 refills | Status: DC | PRN
  Filled 2022-12-01 (×2): qty 30, 30d supply, fill #0
  Filled 2022-12-28: qty 30, 30d supply, fill #1

## 2022-12-01 MED ORDER — MELOXICAM 15 MG PO TABS
15.0000 mg | ORAL_TABLET | ORAL | 1 refills | Status: DC | PRN
  Filled 2022-12-01 (×2): qty 30, 30d supply, fill #0
  Filled 2022-12-28: qty 30, 30d supply, fill #1

## 2022-12-01 NOTE — Pre-Procedure Instructions (Signed)
PCP - [name], MD Cardiologist - Chrystie Nose, MD Electrophysiology -   PPM/ICD - Pacemaker-Medtronic Device Orders - in notes dated 12/01/22 Rep Notified - Shari Prows e-mailed  Chest x-ray - DOS EKG - 10/21/22 ECHO - 10/20/22 Cardiac Cath - 12/02/17  Aspirin Instructions: Stop taking on 10/20. Last dose:  Anesthesia review: Y  Patient verbally denies any shortness of breath, fever, cough and chest pain during phone call   -------------  SDW INSTRUCTIONS given:  Your procedure is scheduled on Tuesday, October 22nd.  Report to [facility] Main Entrance "A" at 0615 A.M., and check in at the Admitting office.  Call this number if you have problems the morning of surgery:  [phone]   Remember:  Do not eat or drink after midnight the night before your surgery    Take these medicines the morning of surgery with A SIP OF WATER  carvedilol (COREG)  levothyroxine (SYNTHROID)   As of today, STOP taking any Aspirin (unless otherwise instructed by your surgeon) Aleve, Naproxen, Ibuprofen, Motrin, Advil, Goody's, BC's, all herbal medications, fish oil, and all vitamins.                      Do not wear jewelry, make up, or nail polish            Do not wear lotions, powders, perfumes/colognes, or deodorant.            Do not shave 48 hours prior to surgery.  Men may shave face and neck.            Do not bring valuables to the hospital.            [facility] is not responsible for any belongings or valuables.  Do NOT Smoke (Tobacco/Vaping) 24 hours prior to your procedure If you use a CPAP at night, you may bring all equipment for your overnight stay.   Contacts, glasses, dentures or bridgework may not be worn into surgery.      For patients admitted to the hospital, discharge time will be determined by your treatment team.   Patients discharged the day of surgery will not be allowed to drive home, and someone needs to stay with them for 24 hours.    Special  instructions:   [facility]- Preparing For Surgery  Before surgery, you can play an important role. Because skin is not sterile, your skin needs to be as free of germs as possible. You can reduce the number of germs on your skin by washing with CHG (chlorahexidine gluconate) Soap before surgery.  CHG is an antiseptic cleaner which kills germs and bonds with the skin to continue killing germs even after washing.    Oral Hygiene is also important to reduce your risk of infection.  Remember - BRUSH YOUR TEETH THE MORNING OF SURGERY WITH YOUR REGULAR TOOTHPASTE  Please do not use if you have an allergy to CHG or antibacterial soaps. If your skin becomes reddened/irritated stop using the CHG.  Do not shave (including legs and underarms) for at least 48 hours prior to first CHG shower. It is OK to shave your face.  Please follow these instructions carefully.   Shower the NIGHT BEFORE SURGERY and the MORNING OF SURGERY with DIAL Soap.   Pat yourself dry with a CLEAN TOWEL.  Wear CLEAN PAJAMAS to bed the night before surgery  Place CLEAN SHEETS on your bed the night of your first shower and DO NOT  SLEEP WITH PETS.   Day of Surgery: Please shower morning of surgery  Wear Clean/Comfortable clothing the morning of surgery Do not apply any deodorants/lotions.   Remember to brush your teeth WITH YOUR REGULAR TOOTHPASTE.   Questions were answered. Patient verbalized understanding of instructions.

## 2022-12-01 NOTE — Anesthesia Preprocedure Evaluation (Addendum)
Anesthesia Evaluation  Patient identified by MRN, date of birth, ID band Patient awake    Reviewed: Allergy & Precautions, H&P , NPO status , Patient's Chart, lab work & pertinent test results  Airway Mallampati: II  TM Distance: >3 FB Neck ROM: Full    Dental no notable dental hx. (+) Teeth Intact, Dental Advisory Given   Pulmonary shortness of breath   Pulmonary exam normal breath sounds clear to auscultation       Cardiovascular Exercise Tolerance: Good hypertension, Pt. on medications and Pt. on home beta blockers + CAD, + Cardiac Stents, + CABG and +CHF  + dysrhythmias Atrial Fibrillation + pacemaker  Rhythm:Regular Rate:Normal     Neuro/Psych negative neurological ROS  negative psych ROS   GI/Hepatic negative GI ROS, Neg liver ROS,,,  Endo/Other  Hypothyroidism    Renal/GU negative Renal ROS  negative genitourinary   Musculoskeletal  (+) Arthritis , Osteoarthritis,    Abdominal   Peds  Hematology negative hematology ROS (+)   Anesthesia Other Findings   Reproductive/Obstetrics negative OB ROS                             Anesthesia Physical Anesthesia Plan  ASA: 4  Anesthesia Plan: General   Post-op Pain Management: Tylenol PO (pre-op)* and Minimal or no pain anticipated   Induction: Intravenous  PONV Risk Score and Plan: 2 and Ondansetron and Dexamethasone  Airway Management Planned: Oral ETT  Additional Equipment:   Intra-op Plan:   Post-operative Plan: Extubation in OR  Informed Consent: I have reviewed the patients History and Physical, chart, labs and discussed the procedure including the risks, benefits and alternatives for the proposed anesthesia with the patient or authorized representative who has indicated his/her understanding and acceptance.     Dental advisory given  Plan Discussed with: CRNA  Anesthesia Plan Comments: (PAT note by Antionette Poles,  PA-C:  67 year old male follows with cardiology for history of chronic combined heart failure, EF 35%, MR s/p bioprosthetic MVR, AF/AFL s/p DCCV.  CHB, CAD s/p CABG.  Recently underwent lead revision/implantation of Medtronic CRT-P and extraction of prior LV pacing lead by Dr. Ladona Ridgel on 10/20/2022.  He was seen in follow-up by Perlie Gold, PA-C on 10/24/2022 for recheck as well as preop evaluation and was noted to be doing well at that time.  Per note, "The patient was advised that if he develops new symptoms prior to surgery to contact our office to arrange for a follow-up visit, and he verbalized understanding. According to the Revised Cardiac Risk Index (RCRI), his Perioperative Risk of Major Cardiac Event is (%): 6.6. His Functional Capacity in METs is: 7.99 according to the Duke Activity Status Index (DASI). Therefore, based on ACC/AHA guidelines, patient would be at acceptable risk for the planned procedure without further cardiovascular testing. I will route this recommendation to the requesting party via Epic fax function. Per office protocol, if patient is without any new symptoms or concerns at the time of their virtual visit, he/she may hold Aspirin for 5-7 days prior to procedure. Please resume Aspirin as soon as possible postprocedure, at the discretion of the surgeon."  Patient was recently seen by Dr. Delton Coombes for evaluation of persistent cough.  CT scan noted left upper lobe mass lesion in the left suprahilar region that was not present on CT from 2020.  Concern for possible latency.  Bronchoscopy recommended.  History of Hodgkin's lymphoma s/p chest XRT/chemo.  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  GlideScope used for intubation 10/20/2022.  Preop labs reviewed, unremarkable.   EKG 10/21/22: Atrial-sensed ventricular-paced rhythm. Rate 55.  Perioperative prescription for implanted cardiac device programming per progress note 12/01/2022: Device Information:  Clinic EP  Physician:  Lewayne Bunting, MD   Device Type:  Pacemaker Manufacturer and Phone #:  Medtronic: [phone] Pacemaker Dependent?:  Yes.   Date of Last Device Check:  10/30/22         Normal Device Function?:  Yes.    Electrophysiologist's Recommendations:   Have magnet available.  Provide continuous ECG monitoring when magnet is used or reprogramming is to be performed.   Procedure may interfere with device function.  Magnet should be placed over device during procedure.  TTE 11/01/2021: 1. Left ventricular ejection fraction, by estimation, is 30 to 35%. The  left ventricle has moderately decreased function. The left ventricle  demonstrates global hypokinesis. The left ventricular internal cavity size  was mildly dilated. There is moderate  left ventricular hypertrophy. Left ventricular diastolic function could  not be evaluated.  2. Right ventricular systolic function is low normal. The right  ventricular size is mildly enlarged.  3. Left atrial size was moderately dilated.  4. Right atrial size was mildly dilated.  5. The mitral valve has been repaired/replaced. Trivial mitral valve  regurgitation. The mean mitral valve gradient is 4.0 mmHg. There is a 29  mm Edwards Magna Mitral Bovine present in the mitral position. Procedure  Date: 01/27/18.  6. The aortic valve is tricuspid. There is mild calcification of the  aortic valve. Aortic valve regurgitation is not visualized. Aortic valve  sclerosis is present, with no evidence of aortic valve stenosis.  7. The inferior vena cava is normal in size with greater than 50%  respiratory variability, suggesting right atrial pressure of 3 mmHg.   Comparison(s): No significant change from prior study. 09/18/20 EF 30-35%.      )        Anesthesia Quick Evaluation

## 2022-12-01 NOTE — Progress Notes (Signed)
Anesthesia Chart Review:  67 year old male follows with cardiology for history of chronic combined heart failure, EF 35%, MR s/p bioprosthetic MVR, AF/AFL s/p DCCV.  CHB, CAD s/p CABG.  Recently underwent lead revision/implantation of Medtronic CRT-P and extraction of prior LV pacing lead by Dr. Ladona Ridgel on 10/20/2022.  He was seen in follow-up by Perlie Gold, PA-C on 10/24/2022 for recheck as well as preop evaluation and was noted to be doing well at that time.  Per note, "The patient was advised that if he develops new symptoms prior to surgery to contact our office to arrange for a follow-up visit, and he verbalized understanding. According to the Revised Cardiac Risk Index (RCRI), his Perioperative Risk of Major Cardiac Event is (%): 6.6. His Functional Capacity in METs is: 7.99 according to the Duke Activity Status Index (DASI). Therefore, based on ACC/AHA guidelines, patient would be at acceptable risk for the planned procedure without further cardiovascular testing. I will route this recommendation to the requesting party via Epic fax function. Per office protocol, if patient is without any new symptoms or concerns at the time of their virtual visit, he/she may hold Aspirin for 5-7 days prior to procedure. Please resume Aspirin as soon as possible postprocedure, at the discretion of the surgeon."   Patient was recently seen by Dr. Delton Coombes for evaluation of persistent cough.  CT scan noted left upper lobe mass lesion in the left suprahilar region that was not present on CT from 2020.  Concern for possible latency.  Bronchoscopy recommended.  History of Hodgkin's lymphoma s/p chest XRT/chemo.  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  GlideScope used for intubation 10/20/2022.  Preop labs reviewed, unremarkable.   EKG 10/21/22: Atrial-sensed ventricular-paced rhythm. Rate 55.  Perioperative prescription for implanted cardiac device programming per progress note 12/01/2022: Device  Information:   Clinic EP Physician:  Lewayne Bunting, MD    Device Type:  Pacemaker Manufacturer and Phone #:  Medtronic: [phone] Pacemaker Dependent?:  Yes.   Date of Last Device Check:  10/30/22         Normal Device Function?:  Yes.     Electrophysiologist's Recommendations:   Have magnet available. Provide continuous ECG monitoring when magnet is used or reprogramming is to be performed.  Procedure may interfere with device function.  Magnet should be placed over device during procedure.  TTE 11/01/2021:  1. Left ventricular ejection fraction, by estimation, is 30 to 35%. The  left ventricle has moderately decreased function. The left ventricle  demonstrates global hypokinesis. The left ventricular internal cavity size  was mildly dilated. There is moderate   left ventricular hypertrophy. Left ventricular diastolic function could  not be evaluated.   2. Right ventricular systolic function is low normal. The right  ventricular size is mildly enlarged.   3. Left atrial size was moderately dilated.   4. Right atrial size was mildly dilated.   5. The mitral valve has been repaired/replaced. Trivial mitral valve  regurgitation. The mean mitral valve gradient is 4.0 mmHg. There is a 29  mm Edwards Magna Mitral Bovine present in the mitral position. Procedure  Date: 01/27/18.   6. The aortic valve is tricuspid. There is mild calcification of the  aortic valve. Aortic valve regurgitation is not visualized. Aortic valve  sclerosis is present, with no evidence of aortic valve stenosis.   7. The inferior vena cava is normal in size with greater than 50%  respiratory variability, suggesting right atrial pressure of 3 mmHg.  Comparison(s): No significant change from prior study. 09/18/20 EF 30-35%.      Zannie Cove [facility] Short Stay Center/Anesthesiology Phone [phone] 12/01/2022 2:53 PM

## 2022-12-01 NOTE — Progress Notes (Signed)
PERIOPERATIVE PRESCRIPTION FOR IMPLANTED CARDIAC DEVICE PROGRAMMING  Patient Information: Name:  Tyler Hendrix  DOB:  Apr 30, 1955  MRN:  [id number]    Planned Procedure:  Robotic Assisted Navigational Bronchoscopy  Surgeon:  Dr. Levy Pupa  Date of Procedure:  12/02/22  Cautery will be used.  Position during surgery:  Supine   Please send documentation back to:  [facility] (Fax # [phone])  Device Information:  Clinic EP Physician:  Lewayne Bunting, MD   Device Type:  Pacemaker Manufacturer and Phone #:  Medtronic: [phone] Pacemaker Dependent?:  Yes.   Date of Last Device Check:  10/30/22 Normal Device Function?:  Yes.    Electrophysiologist's Recommendations:  Have magnet available. Provide continuous ECG monitoring when magnet is used or reprogramming is to be performed.  Procedure may interfere with device function.  Magnet should be placed over device during procedure.  Per Device Clinic Standing Orders, Lenor Coffin, RN  2:12 PM 12/01/2022

## 2022-12-02 ENCOUNTER — Ambulatory Visit (HOSPITAL_BASED_OUTPATIENT_CLINIC_OR_DEPARTMENT_OTHER): Payer: Self-pay | Admitting: Physician Assistant

## 2022-12-02 ENCOUNTER — Other Ambulatory Visit: Payer: Self-pay

## 2022-12-02 ENCOUNTER — Ambulatory Visit (HOSPITAL_COMMUNITY): Payer: 59

## 2022-12-02 ENCOUNTER — Ambulatory Visit (HOSPITAL_COMMUNITY)
Admission: RE | Admit: 2022-12-02 | Discharge: 2022-12-02 | Disposition: A | Discharge status: Home / Self Care | Payer: 59 | Attending: Emergency Medicine | Admitting: Emergency Medicine

## 2022-12-02 ENCOUNTER — Encounter (HOSPITAL_COMMUNITY)
Admission: RE | Disposition: A | Discharge status: Home / Self Care | Payer: Self-pay | Source: Home / Self Care | Attending: Emergency Medicine

## 2022-12-02 ENCOUNTER — Encounter (HOSPITAL_COMMUNITY): Payer: Self-pay | Admitting: Emergency Medicine

## 2022-12-02 ENCOUNTER — Ambulatory Visit (HOSPITAL_COMMUNITY): Payer: 59 | Admitting: Physician Assistant

## 2022-12-02 DIAGNOSIS — I4891 Unspecified atrial fibrillation: Secondary | ICD-10-CM | POA: Insufficient documentation

## 2022-12-02 DIAGNOSIS — Z8571 Personal history of Hodgkin lymphoma: Secondary | ICD-10-CM | POA: Diagnosis not present

## 2022-12-02 DIAGNOSIS — R918 Other nonspecific abnormal finding of lung field: Secondary | ICD-10-CM | POA: Diagnosis not present

## 2022-12-02 DIAGNOSIS — I517 Cardiomegaly: Secondary | ICD-10-CM | POA: Diagnosis not present

## 2022-12-02 DIAGNOSIS — Z950 Presence of cardiac pacemaker: Secondary | ICD-10-CM | POA: Insufficient documentation

## 2022-12-02 DIAGNOSIS — E039 Hypothyroidism, unspecified: Secondary | ICD-10-CM | POA: Diagnosis not present

## 2022-12-02 DIAGNOSIS — I110 Hypertensive heart disease with heart failure: Secondary | ICD-10-CM | POA: Insufficient documentation

## 2022-12-02 DIAGNOSIS — I5042 Chronic combined systolic (congestive) and diastolic (congestive) heart failure: Secondary | ICD-10-CM | POA: Diagnosis not present

## 2022-12-02 DIAGNOSIS — Z9221 Personal history of antineoplastic chemotherapy: Secondary | ICD-10-CM | POA: Diagnosis not present

## 2022-12-02 DIAGNOSIS — I2510 Atherosclerotic heart disease of native coronary artery without angina pectoris: Secondary | ICD-10-CM | POA: Diagnosis not present

## 2022-12-02 DIAGNOSIS — Z48813 Encounter for surgical aftercare following surgery on the respiratory system: Secondary | ICD-10-CM | POA: Diagnosis not present

## 2022-12-02 DIAGNOSIS — Z953 Presence of xenogenic heart valve: Secondary | ICD-10-CM | POA: Insufficient documentation

## 2022-12-02 DIAGNOSIS — Z79899 Other long term (current) drug therapy: Secondary | ICD-10-CM | POA: Insufficient documentation

## 2022-12-02 DIAGNOSIS — Z951 Presence of aortocoronary bypass graft: Secondary | ICD-10-CM | POA: Insufficient documentation

## 2022-12-02 DIAGNOSIS — M1990 Unspecified osteoarthritis, unspecified site: Secondary | ICD-10-CM | POA: Diagnosis not present

## 2022-12-02 DIAGNOSIS — R846 Abnormal cytological findings in specimens from respiratory organs and thorax: Secondary | ICD-10-CM | POA: Diagnosis not present

## 2022-12-02 DIAGNOSIS — Z955 Presence of coronary angioplasty implant and graft: Secondary | ICD-10-CM | POA: Diagnosis not present

## 2022-12-02 HISTORY — PX: BRONCHIAL WASHINGS: SHX5105

## 2022-12-02 HISTORY — PX: VIDEO BRONCHOSCOPY WITH RADIAL ENDOBRONCHIAL ULTRASOUND: SHX6849

## 2022-12-02 HISTORY — PX: BRONCHIAL BIOPSY: SHX5109

## 2022-12-02 HISTORY — PX: BRONCHIAL BRUSHINGS: SHX5108

## 2022-12-02 HISTORY — PX: BRONCHIAL NEEDLE ASPIRATION BIOPSY: SHX5106

## 2022-12-02 SURGERY — BRONCHOSCOPY, WITH BIOPSY USING ELECTROMAGNETIC NAVIGATION
Anesthesia: General

## 2022-12-02 MED ORDER — TRAZODONE HCL 50 MG PO TABS: 50.0000 mg | ORAL_TABLET | Freq: Every evening | ORAL | Status: DC | PRN

## 2022-12-02 MED ORDER — ONDANSETRON HCL 4 MG/2ML IJ SOLN
INTRAMUSCULAR | Status: DC | PRN
  Administered 2022-12-02: 4 mg via INTRAVENOUS

## 2022-12-02 MED ORDER — ACETAMINOPHEN 500 MG PO TABS
1000.0000 mg | ORAL_TABLET | Freq: Once | ORAL | Status: AC
  Administered 2022-12-02: 1000 mg via ORAL
  Filled 2022-12-02: qty 2

## 2022-12-02 MED ORDER — CHLORHEXIDINE GLUCONATE 0.12 % MT SOLN
OROMUCOSAL | Status: AC
  Filled 2022-12-02: qty 15

## 2022-12-02 MED ORDER — PHENYLEPHRINE HCL-NACL 20-0.9 MG/250ML-% IV SOLN
INTRAVENOUS | Status: DC | PRN
  Administered 2022-12-02: 30 ug/min via INTRAVENOUS

## 2022-12-02 MED ORDER — SODIUM CHLORIDE 0.9 % IV SOLN: INTRAVENOUS | Status: DC | PRN

## 2022-12-02 MED ORDER — PROPOFOL 10 MG/ML IV BOLUS
INTRAVENOUS | Status: DC | PRN
  Administered 2022-12-02: 100 mg via INTRAVENOUS

## 2022-12-02 MED ORDER — PHENYLEPHRINE 80 MCG/ML (10ML) SYRINGE FOR IV PUSH (FOR BLOOD PRESSURE SUPPORT)
PREFILLED_SYRINGE | INTRAVENOUS | Status: DC | PRN
  Administered 2022-12-02: 80 ug via INTRAVENOUS
  Administered 2022-12-02: 160 ug via INTRAVENOUS

## 2022-12-02 MED ORDER — FENTANYL CITRATE (PF) 100 MCG/2ML IJ SOLN
25.0000 ug | INTRAMUSCULAR | Status: DC | PRN
Start: 2022-12-02 — End: 2022-12-02

## 2022-12-02 MED ORDER — SUGAMMADEX SODIUM 200 MG/2ML IV SOLN
INTRAVENOUS | Status: DC | PRN
  Administered 2022-12-02: 200 mg via INTRAVENOUS

## 2022-12-02 MED ORDER — DEXAMETHASONE SODIUM PHOSPHATE 10 MG/ML IJ SOLN
INTRAMUSCULAR | Status: DC | PRN
  Administered 2022-12-02: 10 mg via INTRAVENOUS

## 2022-12-02 MED ORDER — ROCURONIUM BROMIDE 10 MG/ML (PF) SYRINGE
PREFILLED_SYRINGE | INTRAVENOUS | Status: DC | PRN
  Administered 2022-12-02: 50 mg via INTRAVENOUS
  Administered 2022-12-02: 20 mg via INTRAVENOUS
  Administered 2022-12-02: 10 mg via INTRAVENOUS

## 2022-12-02 NOTE — Transfer of Care (Signed)
Immediate Anesthesia Transfer of Care Note  Patient: Tyler Hendrix  Procedure(s) Performed: ROBOTIC ASSISTED NAVIGATIONAL BRONCHOSCOPY BRONCHIAL WASHINGS BRONCHIAL BIOPSIES BRONCHIAL NEEDLE ASPIRATION BIOPSIES BRONCHIAL BRUSHINGS VIDEO BRONCHOSCOPY WITH RADIAL ENDOBRONCHIAL ULTRASOUND  Patient Location: PACU  Anesthesia Type:General  Level of Consciousness: awake and alert   Airway & Oxygen Therapy: Patient Spontanous Breathing  Post-op Assessment: Report given to RN and Post -op Vital signs reviewed and stable  Post vital signs: Reviewed and stable  Last Vitals:  Vitals Value Taken Time  BP 144/75 12/02/22 1023  Temp    Pulse 53 12/02/22 1023  Resp 27 12/02/22 1023  SpO2 92 % 12/02/22 1023  Vitals shown include unfiled device data.  Last Pain:  Vitals:   12/02/22 0750  PainSc: 0-No pain         Complications: No notable events documented.

## 2022-12-02 NOTE — Op Note (Signed)
Video Bronchoscopy with Robotic Assisted Bronchoscopic Navigation   Date of Operation: 12/02/2022   Pre-op Diagnosis: Left upper lobe mass  Post-op Diagnosis: Same  Surgeon: Levy Pupa  Assistants: None  Anesthesia: General endotracheal anesthesia  Operation: Flexible video fiberoptic bronchoscopy with robotic assistance and biopsies.  Estimated Blood Loss: Minimal  Complications: None  Indications and History: Tyler Hendrix is a 67 y.o. male with history of remote lymphoma.  He was experiencing cough and underwent chest x-ray then CT chest that identified a left suprahilar mass and left upper lobe.  Recommendation made to achieve a tissue diagnosis and culture data via robotic assisted navigational bronchoscopy. The risks, benefits, complications, treatment options and expected outcomes were discussed with the patient.  The possibilities of pneumothorax, pneumonia, reaction to medication, pulmonary aspiration, perforation of a viscus, bleeding, failure to diagnose a condition and creating a complication requiring transfusion or operation were discussed with the patient who freely signed the consent.    Description of Procedure: The patient was seen in the Preoperative Area, was examined and was deemed appropriate to proceed.  The patient was taken to [facility] endoscopy room 3, identified as Tyler Hendrix and the procedure verified as Flexible Video Fiberoptic Bronchoscopy.  A Time Out was held and the above information confirmed.   Prior to the date of the procedure a high-resolution CT scan of the chest was performed. Utilizing ION software program a virtual tracheobronchial tree was generated to allow the creation of distinct navigation pathways to the patient's parenchymal abnormalities. After being taken to the operating room general anesthesia was initiated and the patient  was orally intubated. The video fiberoptic bronchoscope was introduced via the endotracheal tube and a general  inspection was performed which showed normal right lung anatomy.  There was a small raised pale endobronchial lesion at the orifice to the left upper lobe.  Endobronchial biopsies were performed at this location for pathology/cytology.  Aspiration of the bilateral mainstems was completed to remove any remaining secretions. Robotic catheter inserted into patient's endotracheal tube.   Target #1 left upper lobe mass: The distinct navigation pathways prepared prior to this procedure were then utilized to navigate to patient's lesion identified on CT scan. The robotic catheter was secured into place and the vision probe was withdrawn.  Lesion location was approximated using fluoroscopy and radial endobronchial ultrasound for peripheral targeting.  Local registration and targeting was also performed using Cios three-dimensional imaging.  Under fluoroscopic guidance transbronchial needle brushings, transbronchial needle biopsies, and transbronchial forceps biopsies were performed to be sent for cytology and pathology. A bronchioalveolar lavage was performed in the left upper lobe and sent for microbiology  At the end of the procedure a general airway inspection was performed and there was no evidence of active bleeding. The bronchoscope was removed.  The patient tolerated the procedure well. There was no significant blood loss and there were no obvious complications. A post-procedural chest x-ray is pending.  Samples Target #1: 1. Transbronchial needle brushings from left upper lobe mass 2. Transbronchial Wang needle biopsies from left upper lobe mass 3. Transbronchial forceps biopsies from left upper lobe mass 4. Bronchoalveolar lavage from left upper lobe 5. Endobronchial biopsies from left upper lobe airway endobronchial lesion   Plans:  The patient will be discharged from the PACU to home when recovered from anesthesia and after chest x-ray is reviewed. We will review the cytology, pathology and  microbiology results with the patient when they become available. Outpatient followup will be with  Dr. Delton Coombes.   Levy Pupa, MD, PhD 12/02/2022, 10:22 AM [facility] [phone] or if no answer before 7:00PM call [phone] For any issues after 7:00PM please call eLink [phone]

## 2022-12-02 NOTE — Anesthesia Postprocedure Evaluation (Signed)
Anesthesia Post Note  Patient: Tyler Hendrix  Procedure(s) Performed: ROBOTIC ASSISTED NAVIGATIONAL BRONCHOSCOPY BRONCHIAL WASHINGS BRONCHIAL BIOPSIES BRONCHIAL NEEDLE ASPIRATION BIOPSIES BRONCHIAL BRUSHINGS VIDEO BRONCHOSCOPY WITH RADIAL ENDOBRONCHIAL ULTRASOUND     Patient location during evaluation: PACU Anesthesia Type: General Level of consciousness: awake and alert Pain management: pain level controlled Vital Signs Assessment: post-procedure vital signs reviewed and stable Respiratory status: spontaneous breathing, nonlabored ventilation and respiratory function stable Cardiovascular status: blood pressure returned to baseline and stable Postop Assessment: no apparent nausea or vomiting Anesthetic complications: no  No notable events documented.  Last Vitals:  Vitals:   12/02/22 1045 12/02/22 1053  BP: 124/61 (!) 118/56  Pulse: (!) 51 (!) 50  Resp: 16 20  Temp:  36.8 C  SpO2: 92% 93%    Last Pain:  Vitals:   12/02/22 1053  PainSc: 0-No pain                 Devika Dragovich,W. EDMOND

## 2022-12-02 NOTE — Discharge Instructions (Addendum)
Flexible Bronchoscopy, Care After This sheet gives you information about how to care for yourself after your test. Your doctor may also give you more specific instructions. If you have problems or questions, contact your doctor. Follow these instructions at home: Eating and drinking When your numbness is gone and your cough and gag reflexes have come back, you may: Eat only soft foods. Slowly drink liquids. The day after the test, go back to your normal diet. Driving Do not drive for 24 hours if you were given a medicine to help you relax (sedative). Do not drive or use heavy machinery while taking prescription pain medicine. General instructions  Take over-the-counter and prescription medicines only as told by your doctor. Return to your normal activities as told. Ask what activities are safe for you. Do not use any products that have nicotine or tobacco in them. This includes cigarettes and e-cigarettes. If you need help quitting, ask your doctor. Keep all follow-up visits as told by your doctor. This is important. It is very important if you had a tissue sample (biopsy) taken. Get help right away if: You have shortness of breath that gets worse. You get light-headed. You feel like you are going to pass out (faint). You have chest pain. You cough up: More than a little blood. More blood than before. Summary Do not eat or drink anything (not even water) for 2 hours after your test, or until your numbing medicine wears off. Do not use cigarettes. Do not use e-cigarettes. Get help right away if you have chest pain.  Please call our office for any questions or concerns.  [phone].  This information is not intended to replace advice given to you by your health care provider. Make sure you discuss any questions you have with your health care provider. Document Released: 11/24/2008 Document Revised: 01/09/2017 Document Reviewed: 02/15/2016 Elsevier Patient Education  2020 Elsevier  Inc.  

## 2022-12-02 NOTE — Anesthesia Procedure Notes (Signed)
Procedure Name: Intubation Date/Time: 12/02/2022 9:02 AM  Performed by: Sandie Ano, CRNAPre-anesthesia Checklist: Patient identified, Emergency Drugs available, Suction available and Patient being monitored Patient Re-evaluated:Patient Re-evaluated prior to induction Oxygen Delivery Method: Circle System Utilized Preoxygenation: Pre-oxygenation with 100% oxygen Induction Type: IV induction Ventilation: Mask ventilation without difficulty Laryngoscope Size: Mac, 4 and Glidescope Grade View: Grade III Tube type: Oral Tube size: 8.5 mm Number of attempts: 1 Airway Equipment and Method: Stylet and Oral airway Placement Confirmation: ETT inserted through vocal cords under direct vision, positive ETCO2 and breath sounds checked- equal and bilateral Secured at: 22 cm Tube secured with: Tape Dental Injury: Teeth and Oropharynx as per pre-operative assessment  Difficulty Due To: 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

## 2022-12-02 NOTE — Interval H&P Note (Signed)
History and Physical Interval Note:  12/02/2022 7:41 AM  Tyler Hendrix  has presented today for surgery, with the diagnosis of left upper lobe mass.  The various methods of treatment have been discussed with the patient and family. After consideration of risks, benefits and other options for treatment, the patient has consented to  Procedure(s): ROBOTIC ASSISTED NAVIGATIONAL BRONCHOSCOPY (N/A) as a surgical intervention.  The patient's history has been reviewed, patient examined, no change in status, stable for surgery.  I have reviewed the patient's chart and labs.  Questions were answered to the patient's satisfaction.     Leslye Peer

## 2022-12-03 ENCOUNTER — Telehealth: Payer: Self-pay | Admitting: Family Medicine

## 2022-12-03 ENCOUNTER — Encounter: Payer: Self-pay | Admitting: Emergency Medicine

## 2022-12-03 DIAGNOSIS — C3490 Malignant neoplasm of unspecified part of unspecified bronchus or lung: Secondary | ICD-10-CM

## 2022-12-03 NOTE — Telephone Encounter (Signed)
Patient is calling in reference to his results that were posted on MyChart. He would like a referral to an oncologist and a PET scan. Please call and advise [phone]

## 2022-12-03 NOTE — Telephone Encounter (Signed)
I called the patient to discuss neck step in his evaluation.  No answer but I left him a detailed voice message.  I agree that he needs a PET scan and we will go ahead and order this.  He has plans to follow-up with Kandice Robinsons in our office and I want him to keep that appointment. His left upper lobe lesion was difficult to reach on bronchoscopy.  Waiting for pathology results but if these are negative then I would favor alternative biopsy, possibly adrenal biopsy versus referral to thoracic surgery for possible Chamberlain procedure.

## 2022-12-03 NOTE — Telephone Encounter (Signed)
Recently diagnosed with Left lung cancer, requesting referral to oncologist

## 2022-12-04 LAB — CULTURE, BAL-QUANTITATIVE W GRAM STAIN: Culture: 2000 — AB

## 2022-12-04 LAB — ACID FAST SMEAR (AFB, MYCOBACTERIA): Acid Fast Smear: NEGATIVE

## 2022-12-04 NOTE — Addendum Note (Signed)
Addended by: Leslye Peer on: 12/04/2022 12:07 PM   Modules accepted: Orders

## 2022-12-05 NOTE — Telephone Encounter (Signed)
Dr. Delton Coombes has already done this referral

## 2022-12-05 NOTE — Telephone Encounter (Signed)
Pt notified via My Chart

## 2022-12-06 ENCOUNTER — Encounter (HOSPITAL_COMMUNITY): Payer: Self-pay | Admitting: Emergency Medicine

## 2022-12-06 NOTE — Progress Notes (Unsigned)
[facility] [phone]   [phone]  CONSULT NOTE  REFERRING PHYSICIAN:  REASON FOR CONSULTATION:  ***  HPI Tyler Hendrix is a 67 y.o. male who is a never smoker with a history of CAD, CABG, and pacemaker placement for complete heart block, atrial fibrillation/flutter, hypertension, mitral valve regurg, and mechanical mitral valve, Hodgkin's lymphoma treated with chest radiation and chemotherapy and ***is referred to the clinic for possible lung cancer.   ***Ask more about when lymphoma was ***Saw ennever?  The patient had a cough for approximately 3 months prior to his workup.  Therefore he had a chest x-ray on 10/20/2022 that showed a left upper lobe opacity for which he was treated with Augmentin.  Due to the persistent cough he had a CT of the chest on 11/18/2022 revealed a central left upper lobe pulmonary lesion with mediastinal invasion concerning for primary bronchogenic carcinoma.  The scan also showed bilateral adrenal masses consistent with metastasis and a nonspecific right lower lobe 5 mm nodule there was mild left infrahilar adenopathy for which isolated nodal metastasis was a concern.  There was also a new T4 7 mm sclerotic lesion that could be a bone island or isolated osseous metastasis.  He was then referred to Dr. Delton Coombes and establish care on 11/26/2022.  The patient had a prior chest CT from 2020 and these findings were not present at that time.  Due to possible malignancy versus bacterial or atypical infection, Dr. Delton Coombes arrange for navigational bronchoscopy which was performed on 12/02/2022.  He also had a PET scan for the staging workup.  During his bronchoscopy, Dr. Delton Coombes noted that the area was difficult to navigate 2.  The final pathology showed ***.   He had a PET scan on 12/08/2022 that showed ***  If the final pathology is negative then Dr. Delton Coombes would recommend consideration of adrenal biopsy referral to thoracic surgery  for possible [facility] procedure.  Today, the patient is feeling ***    HPI  Past Medical History:  Diagnosis Date   Abnormal TSH    Arthritis    ddd   Atrial fibrillation and flutter (HCC)    s/p DCCV 03/26/2018   CAD in native artery    a. 11/2015: s/p 2 vessel PCI of the first diagonal branch of the LAD in the mid left circumflex with DES. // s/p CABG 01/2018   Chronic combined systolic and diastolic CHF (congestive heart failure) (HCC)    sees Dr. Johney Frame    Complete heart block (HCC)    a. s/p Medtronic ppm 10/2015.   Dyspnea    with exertion   History of radiation therapy 2002   Hodgkin disease (HCC) 2002   a. s/p chest radiation and chemotherapy   Hypertension    Hypothyroidism    sees Dr. Elvera Lennox    Incidental pulmonary nodule 01/19/2018   Left apical opacity - possible scarring   Inguinal hernia 10/14/2022   bilateral   Mitral regurgitation    s/p bioprosthetic MV replacement 01/2018   Pacemaker 10/2015   Medtronic   PVC's (premature ventricular contractions)    Right bundle branch block    S/P CABG x 2 01/27/2018   LIMA to LAD, SVG to RCA, EVH via right thigh   S/P mitral valve replacement with bioprosthetic valve 01/27/2018   29 mm [facility] Mitral stented bovine pericardial tissue valve    Past Surgical History:  Procedure Laterality Date   A-FLUTTER ABLATION N/A 06/20/2021  Procedure: A-FLUTTER ABLATION;  Surgeon: Marinus Maw, MD;  Location: [facility] INVASIVE CV LAB;  Service: Cardiovascular;  Laterality: N/A;   BIV UPGRADE N/A 02/08/2019   Procedure: UPGRADE TO BIV PPM;  Surgeon: Hillis Range, MD;  Location: [facility] INVASIVE CV LAB;  Service: Cardiovascular;  Laterality: N/A;   CARDIAC CATHETERIZATION N/A 11/02/2015   Procedure: Left Heart Cath and Coronary Angiography;  Surgeon: Tonny Bollman, MD;  Location: [facility] INVASIVE CV LAB;  Service: Cardiovascular;  Laterality: N/A;   CARDIAC CATHETERIZATION N/A 11/21/2015   Procedure: Coronary Stent  Intervention;  Surgeon: Tonny Bollman, MD;  Location: [facility] INVASIVE CV LAB;  Service: Cardiovascular;  Laterality: N/A;   CARDIAC CATHETERIZATION  11/2017   CARDIOVERSION N/A 03/26/2018   Procedure: CARDIOVERSION;  Surgeon: Parke Poisson, MD;  Location: [facility] ENDOSCOPY;  Service: Cardiovascular;  Laterality: N/A;   COLONOSCOPY  01/16/2015   per Dr. Christella Hartigan, benign polyps, repeat in 10 yrs    CORONARY ARTERY BYPASS GRAFT N/A 01/27/2018   Procedure: CORONARY ARTERY BYPASS GRAFTING (CABG) x two, using left internal mammary artery and right leg greater saphenous vein harvested endoscopically;  Surgeon: Purcell Nails, MD;  Location: [facility] OR;  Service: Open Heart Surgery;  Laterality: N/A;   EP IMPLANTABLE DEVICE N/A 11/02/2015   MDT Adivisa MRI conditional dual chamber pacemaker implanted by Dr Johney Frame for complete heart block   FEMUR IM NAIL Left 07/01/2022   Procedure: INTRAMEDULLARY (IM) NAIL FEMORAL LEFT ANTEGRADE;  Surgeon: Myrene Galas, MD;  Location: [facility] OR;  Service: Orthopedics;  Laterality: Left;   KNEE SURGERY Right 1972   LEAD EXTRACTION N/A 10/20/2022   Procedure: LEAD EXTRACTION;  Surgeon: Marinus Maw, MD;  Location: [facility] INVASIVE CV LAB;  Service: Cardiovascular;  Laterality: N/A;   LUMBAR LAMINECTOMY  1996   MITRAL VALVE REPLACEMENT N/A 01/27/2018   Procedure: MITRAL VALVE (MV) REPLACEMENT using Magna Mitral Ease Valve Size ;  Surgeon: Purcell Nails, MD;  Location: [facility] OR;  Service: Open Heart Surgery;  Laterality: N/A;   RIGHT/LEFT HEART CATH AND CORONARY ANGIOGRAPHY N/A 12/02/2017   Procedure: RIGHT/LEFT HEART CATH AND CORONARY ANGIOGRAPHY;  Surgeon: Corky Crafts, MD;  Location: [facility] INVASIVE CV LAB;  Service: Cardiovascular;  Laterality: N/A;   TEE WITHOUT CARDIOVERSION N/A 12/09/2017   Procedure: TRANSESOPHAGEAL ECHOCARDIOGRAM (TEE);  Surgeon: Jodelle Red, MD;  Location: [facility] ENDOSCOPY;  Service: Cardiovascular;  Laterality: N/A;   TEE WITHOUT CARDIOVERSION N/A  01/27/2018   Procedure: TRANSESOPHAGEAL ECHOCARDIOGRAM (TEE);  Surgeon: Purcell Nails, MD;  Location: [facility] OR;  Service: Open Heart Surgery;  Laterality: N/A;    Family History  Problem Relation Age of Onset   Cancer Other        prostate father hx   Heart disease Mother    Heart disease Father    Colon cancer Neg Hx     Social History Social History   Tobacco Use   Smoking status: Never   Smokeless tobacco: Never  Vaping Use   Vaping status: Never Used  Substance Use Topics   Alcohol use: Yes    Alcohol/week: 3.0 standard drinks of alcohol    Types: 3 Glasses of wine per week   Drug use: No    Allergies  Allergen Reactions   Other     Pt reported allergic to dust mites that causes of sneezing, runny nose    Current Outpatient Medications  Medication Sig Dispense Refill   aspirin EC 81 MG tablet Take 81 mg by mouth daily.  atorvastatin (LIPITOR) 40 MG tablet Take 1 tablet (40 mg total) by mouth daily. 90 tablet 3   carvedilol (COREG) 6.25 MG tablet Take 1 tablet (6.25 mg total) by mouth 2 (two) times daily with a meal. 180 tablet 3   Cholecalciferol 125 MCG (5000 UT) TABS Take 1 tablet by mouth daily. 30 tablet 6   levothyroxine (SYNTHROID) 75 MCG tablet Take 1 tablet (75 mcg total) by mouth daily before breakfast. 90 tablet 3   meloxicam (MOBIC) 15 MG tablet Take 1 tablet (15 mg total) by mouth every day as needed 30 tablet 1   Multiple Vitamin (MULTIVITAMIN WITH MINERALS) TABS tablet Take 1 tablet by mouth daily.     sacubitril-valsartan (ENTRESTO) 24-26 MG Take 1 tablet by mouth 2 (two) times daily. 180 tablet 3   sildenafil (VIAGRA) 50 MG tablet Take 1 tablet (50 mg total) by mouth daily as needed for erectile dysfunction. 30 tablet 3   spironolactone (ALDACTONE) 25 MG tablet Take one-half tablet (12.5 mg total) by mouth daily. 45 tablet 3   tamsulosin (FLOMAX) 0.4 MG CAPS capsule Take 1 capsule (0.4 mg total) by mouth daily as needed. 30 capsule 1   traZODone  (DESYREL) 50 MG tablet Take 1 tablet (50 mg total) by mouth at bedtime as needed for sleep.     No current facility-administered medications for this visit.    REVIEW OF SYSTEMS:   Review of Systems  Constitutional: Negative for appetite change, chills, fatigue, fever and unexpected weight change.  HENT:   Negative for mouth sores, nosebleeds, sore throat and trouble swallowing.   Eyes: Negative for eye problems and icterus.  Respiratory: Negative for cough, hemoptysis, shortness of breath and wheezing.   Cardiovascular: Negative for chest pain and leg swelling.  Gastrointestinal: Negative for abdominal pain, constipation, diarrhea, nausea and vomiting.  Genitourinary: Negative for bladder incontinence, difficulty urinating, dysuria, frequency and hematuria.   Musculoskeletal: Negative for back pain, gait problem, neck pain and neck stiffness.  Skin: Negative for itching and rash.  Neurological: Negative for dizziness, extremity weakness, gait problem, headaches, light-headedness and seizures.  Hematological: Negative for adenopathy. Does not bruise/bleed easily.  Psychiatric/Behavioral: Negative for confusion, depression and sleep disturbance. The patient is not nervous/anxious.     PHYSICAL EXAMINATION:  There were no vitals taken for this visit.  ECOG PERFORMANCE STATUS: {CHL ONC ECOG [id number]  Physical Exam  Constitutional: Oriented to person, place, and time and well-developed, well-nourished, and in no distress. No distress.  HENT:  Head: Normocephalic and atraumatic.  Mouth/Throat: Oropharynx is clear and moist. No oropharyngeal exudate.  Eyes: Conjunctivae are normal. Right eye exhibits no discharge. Left eye exhibits no discharge. No scleral icterus.  Neck: Normal range of motion. Neck supple.  Cardiovascular: Normal rate, regular rhythm, normal heart sounds and intact distal pulses.   Pulmonary/Chest: Effort normal and breath sounds normal. No respiratory distress.  No wheezes. No rales.  Abdominal: Soft. Bowel sounds are normal. Exhibits no distension and no mass. There is no tenderness.  Musculoskeletal: Normal range of motion. Exhibits no edema.  Lymphadenopathy:    No cervical adenopathy.  Neurological: Alert and oriented to person, place, and time. Exhibits normal muscle tone. Gait normal. Coordination normal.  Skin: Skin is warm and dry. No rash noted. Not diaphoretic. No erythema. No pallor.  Psychiatric: Mood, memory and judgment normal.  Vitals reviewed.  LABORATORY DATA: Lab Results  Component Value Date   WBC 8.7 11/03/2022   HGB 11.1 (L) 11/03/2022   HCT  32.9 (L) 11/03/2022   MCV 94.3 11/03/2022   PLT 388.0 11/03/2022      Chemistry      Component Value Date/Time   NA 138 11/03/2022 0902   NA 137 06/06/2021 0919   K 4.6 11/03/2022 0902   CL 102 11/03/2022 0902   CO2 28 11/03/2022 0902   BUN 19 11/03/2022 0902   BUN 19 06/06/2021 0919   CREATININE 1.12 11/03/2022 0902   CREATININE 1.04 01/01/2016 0801      Component Value Date/Time   CALCIUM 9.3 11/03/2022 0902   ALKPHOS 142 (H) 08/19/2022 0839   AST 16 08/19/2022 0839   ALT 13 08/19/2022 0839   BILITOT 0.6 08/19/2022 0839       RADIOGRAPHIC STUDIES: CT Chest W Contrast  Result Date: 12/02/2022 CLINICAL DATA:  Chronic cough. Chronic left upper lobe lung opacity. History of CHF. Hodgkin's lymphoma 22 years ago with radiation therapy and chemotherapy. * Tracking Code: BO * EXAM: CT CHEST WITH CONTRAST TECHNIQUE: Multidetector CT imaging of the chest was performed during intravenous contrast administration. RADIATION DOSE REDUCTION: This exam was performed according to the departmental dose-optimization program which includes automated exposure control, adjustment of the mA and/or kV according to patient size and/or use of iterative reconstruction technique. CONTRAST:  75mL ISOVUE-300 IOPAMIDOL (ISOVUE-300) INJECTION 61% COMPARISON:  Plain films including 10/30/2022. Chest  CT of 04/23/2018 also reviewed. FINDINGS: Cardiovascular: Aortic atherosclerosis. Median sternotomy for CABG. Mitral valve repair. Pacer. Moderate cardiomegaly. No pericardial effusion. No central pulmonary embolism, on this non-dedicated study. Mediastinum/Nodes: No supraclavicular adenopathy. Left infrahilar node is mildly enlarged at 8 mm on 66/2. No separate mediastinal adenopathy. Lungs/Pleura: No pleural fluid. Scattered millimetric pulmonary nodules, primarily similar and some calcified, considered benign. There is a right lower lobe 5 mm nodule on 123/5 which is new since the prior CT. Corresponding to the plain film abnormality, within the central left upper lobe, is a spiculated lung mass which measures 4.1 x 3.2 cm on 48/5. This invades the anterior mediastinum, intimately associated with the descending aorta and pulmonary outflow tract including on images 48/2 and 52/2 respectively. Upper Abdomen: Normal imaged portions of the liver, spleen, stomach, pancreas, gallbladder, kidneys. Development of left-greater-than-right adrenal masses. The left-sided mass is heterogeneous and necrotic. Example 5.0 x 3.1 cm on 153/2. Gastrohepatic ligament adenopathy is mild at 9 mm on 146/2, improved from 14 mm on 04/23/2018. Musculoskeletal: New T4 sclerotic lesion of 7 mm on sagittal image 108. IMPRESSION: 1. 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 2. Development of bilateral adrenal masses, most consistent with metastasis. 3. Nonspecific right lower lobe 5 mm nodule, new since 2020. 4. Mild left infrahilar adenopathy for which isolated nodal metastasis is a concern. 5. Mild gastrohepatic ligament adenopathy, improved since 04/23/2018. Indeterminate. 6. New T4 7 mm sclerotic lesion could represent a bone island or isolated osseous metastasis. Recommend attention on follow-up. Electronically Signed   By: Jeronimo Greaves M.D.   On: 12/02/2022 13:55   DG CHEST PORT 1 VIEW  Result  Date: 12/02/2022 CLINICAL DATA:  Status post bronchoscopy EXAM: PORTABLE CHEST 1 VIEW COMPARISON:  Plain film of earlier today.  CT 11/18/2022. FINDINGS: Pacer. Median sternotomy for CABG. Mitral valve repair. Midline trachea. Mild cardiomegaly. No pleural effusion or pneumothorax. Left suprahilar lung mass again identified. Clear right lung. No congestive failure. IMPRESSION: No pneumothorax or other acute complication after bronchoscopy. Left suprahilar lung mass, as before. Electronically Signed   By: Hosie Spangle.D.  On: 12/02/2022 13:42   DG Chest Port 1 View  Result Date: 12/02/2022 CLINICAL DATA:  Status post bronchoscopy. EXAM: PORTABLE CHEST 1 VIEW COMPARISON:  Radiograph 10/30/2022, CT 11/18/2022 FINDINGS: No pneumothorax evident post bronchoscopy. LEFT suprahilar mass again noted. No pleural fluid.  No atelectasis. LEFT-sided pacer IMPRESSION: 1. No pneumothorax post bronchoscopy. 2. LEFT suprahilar mass. Electronically Signed   By: Genevive Bi M.D.   On: 12/02/2022 11:06   DG C-ARM BRONCHOSCOPY  Result Date: 12/02/2022 C-ARM BRONCHOSCOPY: Fluoroscopy was utilized by the requesting physician.  No radiographic interpretation.   DG C-Arm 1-60 Min-No Report  Result Date: 12/02/2022 Fluoroscopy was utilized by the requesting physician.  No radiographic interpretation.    ASSESSMENT: This is a very pleasant 67 year old never smoker Caucasian male referred to the clinic for suspicious stage IV lung cancer(T***, N1***, M1).  He was diagnosed in October 2024.  Molecular studies **  The patient was seen Dr. Arbutus Ped today.  Dr. Arbutus Ped had lengthy discussion with the patient today about his current condition and workup.  We will arrange for molecular studies with ***.   ***or repeat biopsy with adrenal ***??  If he is negative for any targetable mutations, then Dr. Arbutus Ped would recommend treatment with systemic chemotherapy with carboplatin for an AUC of 5, Alimta 500 mg/m, and  Keytruda 200 mg IV every 3 weeks. *** The patient is interested in proceeding with systemic chemotherapy.  She is expected to start her first dose of this treatment on __.  We discussed the adverse side effects of treatment including but not limited to alopecia, myelosuppression, nausea and vomiting, peripheral neuropathy, liver or renal dysfunction as well as immunotherapy mediated adverse effects.   I will arrange for the patient to have a chemoeducation class prior to receiving her first cycle of chemotherapy.   We will arrange for the patient to have a B12 injection while in the clinic today.     I sent prescriptions for 1 mg folic acid p.o. daily as well as Compazine 10 mg every 6 hours as needed for nausea.   The patient will follow-up in 2 weeks for a one-week follow-up visit after completing her first cycle of chemotherapy.  The patient was advised to call immediately if she has any concerning symptoms in the interval. The patient voices understanding of current disease status and treatment options and is in agreement with the current care plan. All questions were answered. The patient knows to call the clinic with any problems, questions or concerns. We can certainly see the patient much sooner if necessary *  The patient voices understanding of current disease status and treatment options and is in agreement with the current care plan.  All questions were answered. The patient knows to call the clinic with any problems, questions or concerns. We can certainly see the patient much sooner if necessary.  Thank you so much for allowing me to participate in the care of Tyler Hendrix. I will continue to follow up the patient with you and assist in his care.  I spent {CHL ONC TIME VISIT - ZOXWR:[id number]} counseling the patient face to face. The total time spent in the appointment was {CHL ONC TIME VISIT - BJYNW:[id number]}.  Disclaimer: This note was dictated with voice recognition  software. Similar sounding words can inadvertently be transcribed and may not be corrected upon review.   Lane Kjos L Hollister Wessler December 06, 2022, 6:11 PM

## 2022-12-07 LAB — AEROBIC/ANAEROBIC CULTURE W GRAM STAIN (SURGICAL/DEEP WOUND): Culture: NORMAL

## 2022-12-08 ENCOUNTER — Encounter (HOSPITAL_COMMUNITY): Payer: 59

## 2022-12-08 ENCOUNTER — Other Ambulatory Visit: Payer: Self-pay | Admitting: Physician Assistant

## 2022-12-08 ENCOUNTER — Ambulatory Visit
Admission: RE | Admit: 2022-12-08 | Discharge: 2022-12-08 | Disposition: A | Discharge status: Home / Self Care | Payer: 59 | Source: Ambulatory Visit | Attending: Emergency Medicine | Admitting: Emergency Medicine

## 2022-12-08 DIAGNOSIS — I700 Atherosclerosis of aorta: Secondary | ICD-10-CM | POA: Diagnosis not present

## 2022-12-08 DIAGNOSIS — C3490 Malignant neoplasm of unspecified part of unspecified bronchus or lung: Secondary | ICD-10-CM | POA: Insufficient documentation

## 2022-12-08 DIAGNOSIS — Z8571 Personal history of Hodgkin lymphoma: Secondary | ICD-10-CM | POA: Diagnosis not present

## 2022-12-08 DIAGNOSIS — R918 Other nonspecific abnormal finding of lung field: Secondary | ICD-10-CM

## 2022-12-08 LAB — GLUCOSE, CAPILLARY: Glucose-Capillary: 82 mg/dL (ref 70–99)

## 2022-12-08 MED ORDER — FLUDEOXYGLUCOSE F - 18 (FDG) INJECTION
9.9000 | Freq: Once | INTRAVENOUS | Status: AC | PRN
  Administered 2022-12-08: 9.9 via INTRAVENOUS

## 2022-12-09 ENCOUNTER — Ambulatory Visit (INDEPENDENT_AMBULATORY_CARE_PROVIDER_SITE_OTHER): Payer: 59 | Admitting: Acute Care

## 2022-12-09 ENCOUNTER — Other Ambulatory Visit: Payer: Self-pay | Admitting: Physician Assistant

## 2022-12-09 ENCOUNTER — Other Ambulatory Visit (HOSPITAL_COMMUNITY): Payer: Self-pay

## 2022-12-09 ENCOUNTER — Encounter: Payer: Self-pay | Admitting: Acute Care

## 2022-12-09 VITALS — BP 122/70 | HR 72 | Ht 71.0 in | Wt 183.4 lb

## 2022-12-09 DIAGNOSIS — R062 Wheezing: Secondary | ICD-10-CM

## 2022-12-09 DIAGNOSIS — C3490 Malignant neoplasm of unspecified part of unspecified bronchus or lung: Secondary | ICD-10-CM

## 2022-12-09 MED ORDER — ALBUTEROL SULFATE HFA 108 (90 BASE) MCG/ACT IN AERS
1.0000 | INHALATION_SPRAY | Freq: Four times a day (QID) | RESPIRATORY_TRACT | 2 refills | Status: DC | PRN
  Filled 2022-12-09: qty 13.4, 50d supply, fill #0
  Filled 2023-01-27: qty 6.7, 25d supply, fill #1

## 2022-12-09 NOTE — Progress Notes (Signed)
History of Present Illness/ Synopsis Tyler Hendrix is a 67 y.o. male never smoker with  a history of CAD/CABG and pacemaker for complete heart block.  Atrial fibrillation/flutter with a prior DCCV, hypertension, mitral regurgitation with a mechanical mitral valve, Hodgkin's lymphoma treated with chest radiation and chemotherapy.  He was referred to Dr. Delton Coombes 11/26/2022 for unresolving cough that began about 3 months prior.  Part of his evaluation has included chest x-rays initially following a pacemaker lead exchange on 10/20/2022 that showed a left upper lobe opacity, still present 10 days later.  He was treated with Augmentin for 10 days in late September.    Dr. Delton Coombes reviewed the CT Chest with Contrast 11/18/2022  showed a central left upper lobe mass concerning for primary bronchogenic carcinoma with mediastinal invasion. Bilateral adrenal masses concerning for metastasis, a non specific right lower lobe 5mm nodule that is new since 2020, and a new T4 7 mm sclerotic lesion could represent a bone island or isolated osseous metastasis. He ordered a PET scan ( See results below) concerning for metastatic disease, and planned  for a left upper lobe bronchoscopy with biopsies on 12/02/2022. Pt is here today to review cytology and determine net best steps for care. .    12/09/2022 Patient presents for follow-up after left upper lobe bronchoscopy with biopsies on 12/02/2022.  Patient states he did well after the procedure.  He denies hemoptysis, sore throat, fever, shortness of breath, or adverse reaction to anesthesia. Unfortunately his cytology results are still in process.  I called and spoke to pathology who explained that the slide had been sent to Dr. Luisa Hart to see if he agreed with suspected diagnosis.  Pathology hope to have the final result in epic tomorrow morning. I discussed this with the patient.  Per Dr. Kavin Leech note the left upper lobe lesion was difficult to reach on bronchoscopy.  Plan  is to wait for pathology results and if these are negative plan will be to biopsy an alternative site, possibly adrenal versus referral to thoracic surgery for possible [facility] procedure. Patient is in agreement with this plan however he is anxious to get treatment started and does not want there to be long delays between determining whether we need to do additional biopsy and getting it scheduled. I told the patient that I would call him as soon as I see the results of the biopsy. Patient does endorse some weight loss about 6 pounds, and states he has noted that he has wheezing when he lays down for the last 2 to 3 weeks.  He states his cough is better and his secretion burden is decreased. I have sent in an albuterol inhaler for him to use to see if that helps with the wheezing he is noted.  Test Results: PET scan 12/02/2022 Hypermetabolic central left upper lobe lung mass invading the mediastinum consistent with known neoplasm. 2. Small hypermetabolic right hilar lymph node likely represents metastatic disease. 3. Bilateral adrenal gland masses and splenic lesions consistent with metastatic disease. 4. Hypermetabolic focus in the right gluteus maximus muscle is likely a muscle metastasis. 5. No findings for osseous metastatic disease.  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 2. Development of bilateral adrenal masses, most consistent with metastasis. 3. Nonspecific right lower lobe 5 mm nodule, new since 2020. 4. Mild left infrahilar adenopathy for which isolated nodal metastasis is a concern. 5. Mild gastrohepatic ligament adenopathy, improved since 04/23/2018. Indeterminate.  6. New T4 7 mm sclerotic lesion could represent a bone island or isolated osseous metastasis. Recommend attention on follow-up.       Latest Ref Rng & Units 11/03/2022    9:02 AM 10/14/2022    9:59 AM 08/19/2022    8:39 AM  CBC   WBC 4.0 - 10.5 K/uL 8.7  8.1  8.4   Hemoglobin 13.0 - 17.0 g/dL 16.1  09.6  04.5   Hematocrit 39.0 - 52.0 % 32.9  37.5  40.1   Platelets 150.0 - 400.0 K/uL 388.0  282  317.0        Latest Ref Rng & Units 11/03/2022    9:02 AM 10/14/2022    9:59 AM 08/19/2022    8:39 AM  BMP  Glucose 70 - 99 mg/dL 409  811  914   BUN 6 - 23 mg/dL 19  13  16    Creatinine 0.40 - 1.50 mg/dL 7.82  9.56  2.13   Sodium 135 - 145 mEq/L 138  138  140   Potassium 3.5 - 5.1 mEq/L 4.6  4.5  4.9   Chloride 96 - 112 mEq/L 102  102  103   CO2 19 - 32 mEq/L 28  26  30    Calcium 8.4 - 10.5 mg/dL 9.3  9.4  08.6     BNP    Component Value Date/Time   BNP 1,710.6 (H) 03/27/2018 1232    ProBNP No results found for: "PROBNP"  PFT    Component Value Date/Time   FEV1PRE 2.03 01/25/2018 1255   FEV1POST 2.33 01/25/2018 1255   FVCPRE 2.80 01/25/2018 1255   FVCPOST 3.05 01/25/2018 1255   TLC 4.79 01/25/2018 1255   DLCOUNC 21.21 01/25/2018 1255   PREFEV1FVCRT 72 01/25/2018 1255   PSTFEV1FVCRT 76 01/25/2018 1255    NM PET Image Initial (PI) Skull Base To Thigh  Result Date: 12/08/2022 CLINICAL DATA:  Initial treatment strategy for left upper lobe lung mass. Non-small cell lung cancer. EXAM: NUCLEAR MEDICINE PET SKULL BASE TO THIGH TECHNIQUE: 9.9 mCi F-18 FDG was injected intravenously. Full-ring PET imaging was performed from the skull base to thigh after the radiotracer. CT data was obtained and used for attenuation correction and anatomic localization. Fasting blood glucose: 82 mg/dl COMPARISON:  Chest CT 57/84/6962 FINDINGS: Mediastinal blood pool activity: SUV max 2.32 Liver activity: SUV max NA NECK: No hypermetabolic lymph nodes in the neck. Incidental CT findings: Bilateral carotid artery calcifications. CHEST: The central left upper lobe lung mass invading the mediastinum demonstrates hypermetabolism with SUV max of 19.93 and consistent with known neoplasm. Small right hilar lymph node has an SUV max of 5.24  and likely represents metastatic disease. No hypermetabolic supraclavicular or axillary nodes. No pulmonary nodules to suggest pulmonary metastatic disease. Incidental CT findings: Stable marked cardiac enlargement. Stable pacer wires and vascular calcifications. ABDOMEN/PELVIS: Bilateral adrenal gland masses are hypermetabolic and consistent with metastatic disease. The right adrenal gland lesion has an SUV max of 15.22 and the left 20.35. Hypermetabolic splenic lesions, difficult to identify on the CT scan. The upper pole lesion has an SUV max of 13.58 and the lateral and more inferior lesion has an SUV max of 20.35. No hepatic metastatic lesions. The pancreas and kidneys are. No hypermetabolic abdominal pelvic lymph nodes. Incidental CT findings: Cholelithiasis noted. Scattered aortic and iliac artery calcifications but no aneurysm. Mild prostate gland enlargement. Small bilateral inguinal hernias containing fat. SKELETON: Hypermetabolic focus in the right gluteus maximus muscle has an SUV max  of 10.56 and is likely a muscle metastasis. No hypermetabolic bone lesions to suggest osseous metastatic disease. Incidental CT findings: None. IMPRESSION: 1. Hypermetabolic central left upper lobe lung mass invading the mediastinum consistent with known neoplasm. 2. Small hypermetabolic right hilar lymph node likely represents metastatic disease. 3. Bilateral adrenal gland masses and splenic lesions consistent with metastatic disease. 4. Hypermetabolic focus in the right gluteus maximus muscle is likely a muscle metastasis. 5. No findings for osseous metastatic disease. Aortic Atherosclerosis (ICD10-I70.0). Electronically Signed   By: Rudie Meyer M.D.   On: 12/08/2022 12:12   CT Chest W Contrast  Result Date: 12/02/2022 CLINICAL DATA:  Chronic cough. Chronic left upper lobe lung opacity. History of CHF. Hodgkin's lymphoma 22 years ago with radiation therapy and chemotherapy. * Tracking Code: BO * EXAM: CT CHEST WITH  CONTRAST TECHNIQUE: Multidetector CT imaging of the chest was performed during intravenous contrast administration. RADIATION DOSE REDUCTION: This exam was performed according to the departmental dose-optimization program which includes automated exposure control, adjustment of the mA and/or kV according to patient size and/or use of iterative reconstruction technique. CONTRAST:  75mL ISOVUE-300 IOPAMIDOL (ISOVUE-300) INJECTION 61% COMPARISON:  Plain films including 10/30/2022. Chest CT of 04/23/2018 also reviewed. FINDINGS: Cardiovascular: Aortic atherosclerosis. Median sternotomy for CABG. Mitral valve repair. Pacer. Moderate cardiomegaly. No pericardial effusion. No central pulmonary embolism, on this non-dedicated study. Mediastinum/Nodes: No supraclavicular adenopathy. Left infrahilar node is mildly enlarged at 8 mm on 66/2. No separate mediastinal adenopathy. Lungs/Pleura: No pleural fluid. Scattered millimetric pulmonary nodules, primarily similar and some calcified, considered benign. There is a right lower lobe 5 mm nodule on 123/5 which is new since the prior CT. Corresponding to the plain film abnormality, within the central left upper lobe, is a spiculated lung mass which measures 4.1 x 3.2 cm on 48/5. This invades the anterior mediastinum, intimately associated with the descending aorta and pulmonary outflow tract including on images 48/2 and 52/2 respectively. Upper Abdomen: Normal imaged portions of the liver, spleen, stomach, pancreas, gallbladder, kidneys. Development of left-greater-than-right adrenal masses. The left-sided mass is heterogeneous and necrotic. Example 5.0 x 3.1 cm on 153/2. Gastrohepatic ligament adenopathy is mild at 9 mm on 146/2, improved from 14 mm on 04/23/2018. Musculoskeletal: New T4 sclerotic lesion of 7 mm on sagittal image 108. IMPRESSION: 1. 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 2. Development of bilateral adrenal masses, most  consistent with metastasis. 3. Nonspecific right lower lobe 5 mm nodule, new since 2020. 4. Mild left infrahilar adenopathy for which isolated nodal metastasis is a concern. 5. Mild gastrohepatic ligament adenopathy, improved since 04/23/2018. Indeterminate. 6. New T4 7 mm sclerotic lesion could represent a bone island or isolated osseous metastasis. Recommend attention on follow-up. Electronically Signed   By: Jeronimo Greaves M.D.   On: 12/02/2022 13:55   DG CHEST PORT 1 VIEW  Result Date: 12/02/2022 CLINICAL DATA:  Status post bronchoscopy EXAM: PORTABLE CHEST 1 VIEW COMPARISON:  Plain film of earlier today.  CT 11/18/2022. FINDINGS: Pacer. Median sternotomy for CABG. Mitral valve repair. Midline trachea. Mild cardiomegaly. No pleural effusion or pneumothorax. Left suprahilar lung mass again identified. Clear right lung. No congestive failure. IMPRESSION: No pneumothorax or other acute complication after bronchoscopy. Left suprahilar lung mass, as before. Electronically Signed   By: Jeronimo Greaves M.D.   On: 12/02/2022 13:42   DG Chest Port 1 View  Result Date: 12/02/2022 CLINICAL DATA:  Status post bronchoscopy. EXAM: PORTABLE CHEST 1 VIEW COMPARISON:  Radiograph  10/30/2022, CT 11/18/2022 FINDINGS: No pneumothorax evident post bronchoscopy. LEFT suprahilar mass again noted. No pleural fluid.  No atelectasis. LEFT-sided pacer IMPRESSION: 1. No pneumothorax post bronchoscopy. 2. LEFT suprahilar mass. Electronically Signed   By: Genevive Bi M.D.   On: 12/02/2022 11:06   DG C-ARM BRONCHOSCOPY  Result Date: 12/02/2022 C-ARM BRONCHOSCOPY: Fluoroscopy was utilized by the requesting physician.  No radiographic interpretation.   DG C-Arm 1-60 Min-No Report  Result Date: 12/02/2022 Fluoroscopy was utilized by the requesting physician.  No radiographic interpretation.     Past medical hx Past Medical History:  Diagnosis Date   Abnormal TSH    Arthritis    ddd   Atrial fibrillation and flutter  (HCC)    s/p DCCV 03/26/2018   CAD in native artery    a. 11/2015: s/p 2 vessel PCI of the first diagonal branch of the LAD in the mid left circumflex with DES. // s/p CABG 01/2018   Chronic combined systolic and diastolic CHF (congestive heart failure) (HCC)    sees Dr. Johney Frame    Complete heart block (HCC)    a. s/p [employer]tr[employer] ppm 10/2015.   Dyspnea    with exertion   History of radiation therapy 2002   Hodgkin disease (HCC) 2002   a. s/p chest radiation and chemotherapy   Hypertension    Hypothyroidism    sees Dr. Elvera Lennox    Incidental pulmonary nodule 01/19/2018   Left apical opacity - possible scarring   Inguinal hernia 10/14/2022   bilateral   Mitral regurgitation    s/p bioprosthetic MV replacement 01/2018   Pacemaker 10/2015   Medtronic   PVC's (premature ventricular contractions)    Right bundle branch block    S/P CABG x 2 01/27/2018   LIMA to LAD, SVG to RCA, EVH via right thigh   S/P mitral valve replacement with bioprosthetic valve 01/27/2018   29 mm [facility] Mitral stented bovine pericardial tissue valve     Social History   Tobacco Use   Smoking status: Never   Smokeless tobacco: Never  Vaping Use   Vaping status: Never Used  Substance Use Topics   Alcohol use: Yes    Alcohol/week: 3.0 standard drinks of alcohol    Types: 3 Glasses of wine per week   Drug use: No    Mr.Seals reports that he has never smoked. He has never used smokeless tobacco. He reports current alcohol use of about 3.0 standard drinks of alcohol per week. He reports that he does not use drugs.  Tobacco Cessation: Never smoker    Past surgical hx, Family hx, Social hx all reviewed.  Current Outpatient Medications on File Prior to Visit  Medication Sig   aspirin EC 81 MG tablet Take 81 mg by mouth daily.   atorvastatin (LIPITOR) 40 MG tablet Take 1 tablet (40 mg total) by mouth daily.   carvedilol (COREG) 6.25 MG tablet Take 1 tablet (6.25 mg total) by mouth 2 (two) times  daily with a meal.   Cholecalciferol 125 MCG (5000 UT) TABS Take 1 tablet by mouth daily.   levothyroxine (SYNTHROID) 75 MCG tablet Take 1 tablet (75 mcg total) by mouth daily before breakfast.   meloxicam (MOBIC) 15 MG tablet Take 1 tablet (15 mg total) by mouth every day as needed   Multiple Vitamin (MULTIVITAMIN WITH MINERALS) TABS tablet Take 1 tablet by mouth daily.   sacubitril-valsartan (ENTRESTO) 24-26 MG Take 1 tablet by mouth 2 (two) times daily.   sildenafil (  VIAGRA) 50 MG tablet Take 1 tablet (50 mg total) by mouth daily as needed for erectile dysfunction.   spironolactone (ALDACTONE) 25 MG tablet Take one-half tablet (12.5 mg total) by mouth daily.   tamsulosin (FLOMAX) 0.4 MG CAPS capsule Take 1 capsule (0.4 mg total) by mouth daily as needed.   traZODone (DESYREL) 50 MG tablet Take 1 tablet (50 mg total) by mouth at bedtime as needed for sleep. (Patient taking differently: Take 50 mg by mouth at bedtime as needed for sleep. 2 po q night)   No current facility-administered medications on file prior to visit.     Allergies  Allergen Reactions   Other     Pt reported allergic to dust mites that causes of sneezing, runny nose    Review Of Systems:  Constitutional:   No  weight loss, night sweats,  Fevers, chills, fatigue, or  lassitude.  HEENT:   No headaches,  Difficulty swallowing,  Tooth/dental problems, or  Sore throat,                No sneezing, itching, ear ache, nasal congestion, post nasal drip,   CV:  No chest pain,  Orthopnea, PND, swelling in lower extremities, anasarca, dizziness, palpitations, syncope.   GI  No heartburn, indigestion, abdominal pain, nausea, vomiting, diarrhea, change in bowel habits, loss of appetite, bloody stools.   Resp: + shortness of breath with exertion less at rest.  +excess mucus, + productive cough,  No non-productive cough,  No coughing up of blood.  No change in color of mucus.  + wheezing when supine.  No chest wall  deformity  Skin: no rash or lesions.  GU: no dysuria, change in color of urine, no urgency or frequency.  No flank pain, no hematuria   MS:  No joint pain or swelling.  No decreased range of motion.  No back pain.  Psych:  No change in mood or affect. No depression or anxiety.  No memory loss.   Vital Signs BP 122/70 (BP Location: Right Arm, Cuff Size: Normal)   Pulse 72   Ht 5\' 11"  (1.803 m)   Wt 183 lb 6.4 oz (83.2 kg)   SpO2 97%   BMI 25.58 kg/m    Physical Exam:  General- No distress,  A&Ox3, pleasant and appropriately concerned ENT: No sinus tenderness, TM clear, pale nasal mucosa, no oral exudate,no post nasal drip, no LAN Cardiac: S1, S2, regular rate and rhythm, no murmur Chest: No wheeze/ rales/ dullness; no accessory muscle use, no nasal flaring, no sternal retractions, diminished per bases Abd.: Soft Non-tender, ND, BS +, Body mass index is 25.58 kg/m.  Ext: No clubbing cyanosis, edema Neuro:  normal strength, MAE x 4, A&O x 3 Skin: No rashes, warm and dry, No lesions  Psych: normal mood and behavior   Assessment/Plan Never smoker  Left Upper Lobe nodule Post bronchoscopy with biopsies  11/2020 Cytology pending>> per pathology should be finalized 10/30 PET scan with concern for metastasis  Plan I am glad you have done well after your procedure.  I will call you once we get the results of the pathology. If results are indeterminate, we will plan on an alternative biopsy site based on PET scan results. Follow up with medical oncology tomorrow as is scheduled. If you need an additional biopsy we will get this scheduled as soon as possible. I have sent in an albuterol inhaler to see if this helps with your wheezing.  It has been sent to  the [facility].  Please contact office for sooner follow up if symptoms do not improve or worsen or seek emergency care    I spent 35 minutes dedicated to the care of this patient on the date of this  encounter to include pre-visit review of records, face-to-face time with the patient discussing conditions above, post visit ordering of testing, clinical documentation with the electronic health record, making appropriate referrals as documented, and communicating necessary information to the patient's healthcare team.      Bevelyn Ngo, NP 12/09/2022  5:35 PM

## 2022-12-09 NOTE — Patient Instructions (Addendum)
It is good to see you today. I am glad you have done well after your procedure.  I will call you once we get the results of the pathology. If results are indeterminate, we will plan on an alternative biopsy site based on PET scan results. Follow up with medical oncology tomorrow as is scheduled. If you need an additional biopsy we will get this scheduled as soon as possible. I have sent in an albuterol inhaler to see if this helps with your wheezing.  It has been sent to the [facility].  Please contact office for sooner follow up if symptoms do not improve or worsen or seek emergency care

## 2022-12-10 ENCOUNTER — Other Ambulatory Visit: Payer: Self-pay

## 2022-12-10 ENCOUNTER — Inpatient Hospital Stay: Payer: 59

## 2022-12-10 ENCOUNTER — Inpatient Hospital Stay: Payer: 59 | Attending: Physician Assistant

## 2022-12-10 ENCOUNTER — Inpatient Hospital Stay (HOSPITAL_BASED_OUTPATIENT_CLINIC_OR_DEPARTMENT_OTHER): Payer: 59 | Admitting: Physician Assistant

## 2022-12-10 VITALS — BP 120/76 | HR 84 | Temp 98.1°F | Resp 19 | Ht 71.0 in | Wt 181.0 lb

## 2022-12-10 DIAGNOSIS — C3412 Malignant neoplasm of upper lobe, left bronchus or lung: Secondary | ICD-10-CM | POA: Diagnosis not present

## 2022-12-10 DIAGNOSIS — C3490 Malignant neoplasm of unspecified part of unspecified bronchus or lung: Secondary | ICD-10-CM

## 2022-12-10 DIAGNOSIS — E039 Hypothyroidism, unspecified: Secondary | ICD-10-CM | POA: Diagnosis not present

## 2022-12-10 DIAGNOSIS — Z79899 Other long term (current) drug therapy: Secondary | ICD-10-CM | POA: Diagnosis not present

## 2022-12-10 DIAGNOSIS — Z923 Personal history of irradiation: Secondary | ICD-10-CM | POA: Diagnosis not present

## 2022-12-10 DIAGNOSIS — Z8571 Personal history of Hodgkin lymphoma: Secondary | ICD-10-CM | POA: Diagnosis not present

## 2022-12-10 DIAGNOSIS — C799 Secondary malignant neoplasm of unspecified site: Secondary | ICD-10-CM

## 2022-12-10 DIAGNOSIS — C7971 Secondary malignant neoplasm of right adrenal gland: Secondary | ICD-10-CM

## 2022-12-10 DIAGNOSIS — Z9221 Personal history of antineoplastic chemotherapy: Secondary | ICD-10-CM | POA: Diagnosis not present

## 2022-12-10 DIAGNOSIS — R918 Other nonspecific abnormal finding of lung field: Secondary | ICD-10-CM

## 2022-12-10 LAB — CMP (CANCER CENTER ONLY)
ALT: 16 U/L (ref 0–44)
AST: 17 U/L (ref 15–41)
Albumin: 4.4 g/dL (ref 3.5–5.0)
Alkaline Phosphatase: 76 U/L (ref 38–126)
Anion gap: 6 (ref 5–15)
BUN: 18 mg/dL (ref 8–23)
CO2: 28 mmol/L (ref 22–32)
Calcium: 9.7 mg/dL (ref 8.9–10.3)
Chloride: 106 mmol/L (ref 98–111)
Creatinine: 1.22 mg/dL (ref 0.61–1.24)
GFR, Estimated: >60 mL/min (ref >60)
Glucose, Bld: 104 mg/dL — ABNORMAL HIGH (ref 70–99)
Potassium: 5 mmol/L (ref 3.5–5.1)
Sodium: 140 mmol/L (ref 135–145)
Total Bilirubin: 0.6 mg/dL (ref 0.3–1.2)
Total Protein: 7 g/dL (ref 6.5–8.1)

## 2022-12-10 LAB — CBC WITH DIFFERENTIAL (CANCER CENTER ONLY)
Abs Immature Granulocytes: 0.03 10*3/uL (ref 0.00–0.07)
Basophils Absolute: 0 10*3/uL (ref 0.0–0.1)
Basophils Relative: 1 %
Eosinophils Absolute: 0.2 10*3/uL (ref 0.0–0.5)
Eosinophils Relative: 3 %
HCT: 36.3 % — ABNORMAL LOW (ref 39.0–52.0)
Hemoglobin: 12.2 g/dL — ABNORMAL LOW (ref 13.0–17.0)
Immature Granulocytes: 0 %
Lymphocytes Relative: 15 %
Lymphs Abs: 1 10*3/uL (ref 0.7–4.0)
MCH: 31.9 pg (ref 26.0–34.0)
MCHC: 33.6 g/dL (ref 30.0–36.0)
MCV: 94.8 fL (ref 80.0–100.0)
Monocytes Absolute: 0.5 10*3/uL (ref 0.1–1.0)
Monocytes Relative: 8 %
Neutro Abs: 5.2 10*3/uL (ref 1.7–7.7)
Neutrophils Relative %: 73 %
Platelet Count: 287 10*3/uL (ref 150–400)
RBC: 3.83 MIL/uL — ABNORMAL LOW (ref 4.22–5.81)
RDW: 13.6 % (ref 11.5–15.5)
WBC Count: 7.1 10*3/uL (ref 4.0–10.5)
nRBC: 0 % (ref 0.0–0.2)

## 2022-12-10 LAB — CYTOLOGY - NON PAP

## 2022-12-11 ENCOUNTER — Encounter: Payer: Self-pay | Admitting: Physician Assistant

## 2022-12-11 ENCOUNTER — Other Ambulatory Visit: Payer: Self-pay | Admitting: Physician Assistant

## 2022-12-11 DIAGNOSIS — C799 Secondary malignant neoplasm of unspecified site: Secondary | ICD-10-CM

## 2022-12-11 NOTE — Progress Notes (Signed)
Met pt today for the first time at his med onc consult with Tyler Hendrix, Onc PA And Tyler Hendrix. Pt was accompanied by his wife, Tyler Hendrix. I introduced myself and explained the role of a NN. The plan for the pt to have a biopsy of the PET avid node in his right gluteal muscle and a head CT w/ and w/o contrast as pt's pacemaker prevents him from getting an MRI. Pt also needs to return to the lab to have a [facility] blood draw done. Paper order already in place at the lab.  I provided my business card to the pt and his wife and encouraged them to call me with any questions or concerns.  At 4:30 I reached out to scheduling to arrange for the pt's biopsy. Order for biopsy being reviewed by radiology. I reached out to the pt at the suggestion of the radiology schedulers and let him know he should hold his aspirin for now as he may be able to get the biopsy early next week and the aspirin puts him at risk for bleeding. The pt understood the reasoning for holding the aspirin and has agreed to do so.

## 2022-12-12 ENCOUNTER — Telehealth: Payer: Self-pay | Admitting: Physician Assistant

## 2022-12-12 ENCOUNTER — Ambulatory Visit (HOSPITAL_COMMUNITY)
Admission: RE | Admit: 2022-12-12 | Discharge: 2022-12-12 | Disposition: A | Discharge status: Home / Self Care | Payer: 59 | Source: Ambulatory Visit | Attending: Physician Assistant

## 2022-12-12 DIAGNOSIS — C799 Secondary malignant neoplasm of unspecified site: Secondary | ICD-10-CM | POA: Diagnosis not present

## 2022-12-12 DIAGNOSIS — Z0389 Encounter for observation for other suspected diseases and conditions ruled out: Secondary | ICD-10-CM | POA: Diagnosis not present

## 2022-12-12 MED ORDER — IOHEXOL 300 MG/ML  SOLN
75.0000 mL | Freq: Once | INTRAMUSCULAR | Status: AC | PRN
  Administered 2022-12-12: 75 mL via INTRAVENOUS

## 2022-12-12 NOTE — Telephone Encounter (Signed)
Left patient a message in regards to scheduled appointment times/dates for follow up appointment; left callback if needed for rescheduling

## 2022-12-12 NOTE — Progress Notes (Signed)
Irish Lack, MD  [facility] PROCEDURE / BIOPSY REVIEW Date: 12/11/22  Requested Biopsy site: Left adrenal gland mass. Reason for request: Nondiagnostic lung bx. Gluteal lesion unlikely to be seen by imaging. Imaging review: Best seen on PET  Decision: Approved Imaging modality to perform: Ultrasound and CT Schedule with: Moderate Sedation Schedule for: Any VIR  Additional comments:   Please contact me with questions, concerns, or if issue pertaining to this request arise.  Reola Calkins, MD [facility]

## 2022-12-15 ENCOUNTER — Other Ambulatory Visit: Payer: Self-pay | Admitting: Physician Assistant

## 2022-12-15 DIAGNOSIS — Z01818 Encounter for other preprocedural examination: Secondary | ICD-10-CM

## 2022-12-16 ENCOUNTER — Ambulatory Visit (HOSPITAL_COMMUNITY)
Admission: RE | Admit: 2022-12-16 | Discharge: 2022-12-16 | Disposition: A | Discharge status: Home / Self Care | Payer: 59 | Source: Ambulatory Visit | Attending: Physician Assistant | Admitting: Physician Assistant

## 2022-12-16 ENCOUNTER — Encounter (HOSPITAL_COMMUNITY): Payer: Self-pay

## 2022-12-16 ENCOUNTER — Other Ambulatory Visit: Payer: Self-pay

## 2022-12-16 DIAGNOSIS — C799 Secondary malignant neoplasm of unspecified site: Secondary | ICD-10-CM | POA: Insufficient documentation

## 2022-12-16 DIAGNOSIS — C801 Malignant (primary) neoplasm, unspecified: Secondary | ICD-10-CM | POA: Diagnosis not present

## 2022-12-16 DIAGNOSIS — E279 Disorder of adrenal gland, unspecified: Secondary | ICD-10-CM | POA: Diagnosis not present

## 2022-12-16 DIAGNOSIS — C7972 Secondary malignant neoplasm of left adrenal gland: Secondary | ICD-10-CM | POA: Diagnosis not present

## 2022-12-16 DIAGNOSIS — Z01818 Encounter for other preprocedural examination: Secondary | ICD-10-CM | POA: Diagnosis not present

## 2022-12-16 LAB — CBC
HCT: 36 % — ABNORMAL LOW (ref 39.0–52.0)
Hemoglobin: 11.9 g/dL — ABNORMAL LOW (ref 13.0–17.0)
MCH: 31.1 pg (ref 26.0–34.0)
MCHC: 33.1 g/dL (ref 30.0–36.0)
MCV: 94 fL (ref 80.0–100.0)
Platelets: 252 10*3/uL (ref 150–400)
RBC: 3.83 MIL/uL — ABNORMAL LOW (ref 4.22–5.81)
RDW: 13.6 % (ref 11.5–15.5)
WBC: 7.4 10*3/uL (ref 4.0–10.5)
nRBC: 0 % (ref 0.0–0.2)

## 2022-12-16 LAB — PROTIME-INR
INR: 1 (ref 0.8–1.2)
Prothrombin Time: 12.9 s (ref 11.4–15.2)

## 2022-12-16 MED ORDER — LIDOCAINE HCL 1 % IJ SOLN
10.0000 mL | Freq: Once | INTRAMUSCULAR | Status: AC
  Administered 2022-12-16: 10 mL

## 2022-12-16 MED ORDER — FENTANYL CITRATE (PF) 100 MCG/2ML IJ SOLN
INTRAMUSCULAR | Status: AC
  Filled 2022-12-16: qty 2

## 2022-12-16 MED ORDER — MIDAZOLAM HCL 2 MG/2ML IJ SOLN
INTRAMUSCULAR | Status: AC
  Filled 2022-12-16: qty 2

## 2022-12-16 MED ORDER — MIDAZOLAM HCL 2 MG/2ML IJ SOLN
INTRAMUSCULAR | Status: AC | PRN
  Administered 2022-12-16: 1 mg via INTRAVENOUS

## 2022-12-16 MED ORDER — FENTANYL CITRATE (PF) 100 MCG/2ML IJ SOLN
INTRAMUSCULAR | Status: AC | PRN
  Administered 2022-12-16: 25 ug via INTRAVENOUS
  Administered 2022-12-16: 50 ug via INTRAVENOUS

## 2022-12-16 NOTE — H&P (Signed)
Chief Complaint: Patient was seen in consultation today for left adrenal mass biopsy at the request of Dr. Ardelle Anton   Supervising Physician: Gilmer Mor  Patient Status: [facility] - Out-pt  History of Present Illness: Tyler Hendrix is a 67 y.o. male   FULL code per patient and chart  History of Hodgkin lymphoma in 2002 Never smoker, CAD, CABg, pacemaker, HTN, MVR Dr. Delton Coombes performed broncospoy 12/02/22 which was non-diagnostic. Further studies included PET on 12/08/22 IMPRESSION: 1. Hypermetabolic central left upper lobe lung mass invading the mediastinum consistent with known neoplasm. 2. Small hypermetabolic right hilar lymph node likely represents metastatic disease. 3. Bilateral adrenal gland masses and splenic lesions consistent with metastatic disease. 4. Hypermetabolic focus in the right gluteus maximus muscle is likely a muscle metastasis. 5. No findings for osseous metastatic disease.  Dr. Delton Coombes requesting tissue diagnosis  Patient presents for left adrenal mass biopsy today  Past Medical History:  Diagnosis Date   Abnormal TSH    Arthritis    ddd   Atrial fibrillation and flutter (HCC)    s/p DCCV 03/26/2018   CAD in native artery    a. 11/2015: s/p 2 vessel PCI of the first diagonal branch of the LAD in the mid left circumflex with DES. // s/p CABG 01/2018   Chronic combined systolic and diastolic CHF (congestive heart failure) (HCC)    sees Dr. Johney Frame    Complete heart block (HCC)    a. s/p Medtronic ppm 10/2015.   Dyspnea    with exertion   History of radiation therapy 2002   Hodgkin disease (HCC) 2002   a. s/p chest radiation and chemotherapy   Hypertension    Hypothyroidism    sees Dr. Elvera Lennox    Incidental pulmonary nodule 01/19/2018   Left apical opacity - possible scarring   Inguinal hernia 10/14/2022   bilateral   Mitral regurgitation    s/p bioprosthetic MV replacement 01/2018   Pacemaker 10/2015   Medtronic   PVC's (premature  ventricular contractions)    Right bundle branch block    S/P CABG x 2 01/27/2018   LIMA to LAD, SVG to RCA, EVH via right thigh   S/P mitral valve replacement with bioprosthetic valve 01/27/2018   29 mm [facility] Mitral stented bovine pericardial tissue valve    Past Surgical History:  Procedure Laterality Date   A-FLUTTER ABLATION N/A 06/20/2021   Procedure: A-FLUTTER ABLATION;  Surgeon: Marinus Maw, MD;  Location: [facility] INVASIVE CV LAB;  Service: Cardiovascular;  Laterality: N/A;   BIV UPGRADE N/A 02/08/2019   Procedure: UPGRADE TO BIV PPM;  Surgeon: Hillis Range, MD;  Location: [facility] INVASIVE CV LAB;  Service: Cardiovascular;  Laterality: N/A;   BRONCHIAL BIOPSY  12/02/2022   Procedure: BRONCHIAL BIOPSIES;  Surgeon: Leslye Peer, MD;  Location: [facility] ENDOSCOPY;  Service: Pulmonary;;   BRONCHIAL BRUSHINGS  12/02/2022   Procedure: BRONCHIAL BRUSHINGS;  Surgeon: Leslye Peer, MD;  Location: [facility] ENDOSCOPY;  Service: Pulmonary;;   BRONCHIAL NEEDLE ASPIRATION BIOPSY  12/02/2022   Procedure: BRONCHIAL NEEDLE ASPIRATION BIOPSIES;  Surgeon: Leslye Peer, MD;  Location: [facility] ENDOSCOPY;  Service: Pulmonary;;   BRONCHIAL WASHINGS  12/02/2022   Procedure: BRONCHIAL WASHINGS;  Surgeon: Leslye Peer, MD;  Location: [facility] ENDOSCOPY;  Service: Pulmonary;;   CARDIAC CATHETERIZATION N/A 11/02/2015   Procedure: Left Heart Cath and Coronary Angiography;  Surgeon: Tonny Bollman, MD;  Location: [facility] INVASIVE CV LAB;  Service: Cardiovascular;  Laterality: N/A;   CARDIAC  CATHETERIZATION N/A 11/21/2015   Procedure: Coronary Stent Intervention;  Surgeon: Tonny Bollman, MD;  Location: [facility] INVASIVE CV LAB;  Service: Cardiovascular;  Laterality: N/A;   CARDIAC CATHETERIZATION  11/2017   CARDIOVERSION N/A 03/26/2018   Procedure: CARDIOVERSION;  Surgeon: Parke Poisson, MD;  Location: [facility] ENDOSCOPY;  Service: Cardiovascular;  Laterality: N/A;   COLONOSCOPY  01/16/2015   per Dr. Christella Hartigan, benign polyps, repeat  in 10 yrs    CORONARY ARTERY BYPASS GRAFT N/A 01/27/2018   Procedure: CORONARY ARTERY BYPASS GRAFTING (CABG) x two, using left internal mammary artery and right leg greater saphenous vein harvested endoscopically;  Surgeon: Purcell Nails, MD;  Location: [facility] OR;  Service: Open Heart Surgery;  Laterality: N/A;   EP IMPLANTABLE DEVICE N/A 11/02/2015   MDT Adivisa MRI conditional dual chamber pacemaker implanted by Dr Johney Frame for complete heart block   FEMUR IM NAIL Left 07/01/2022   Procedure: INTRAMEDULLARY (IM) NAIL FEMORAL LEFT ANTEGRADE;  Surgeon: Myrene Galas, MD;  Location: [facility] OR;  Service: Orthopedics;  Laterality: Left;   KNEE SURGERY Right 1972   LEAD EXTRACTION N/A 10/20/2022   Procedure: LEAD EXTRACTION;  Surgeon: Marinus Maw, MD;  Location: [facility] INVASIVE CV LAB;  Service: Cardiovascular;  Laterality: N/A;   LUMBAR LAMINECTOMY  1996   MITRAL VALVE REPLACEMENT N/A 01/27/2018   Procedure: MITRAL VALVE (MV) REPLACEMENT using Magna Mitral Ease Valve Size ;  Surgeon: Purcell Nails, MD;  Location: [facility] OR;  Service: Open Heart Surgery;  Laterality: N/A;   RIGHT/LEFT HEART CATH AND CORONARY ANGIOGRAPHY N/A 12/02/2017   Procedure: RIGHT/LEFT HEART CATH AND CORONARY ANGIOGRAPHY;  Surgeon: Corky Crafts, MD;  Location: [facility] INVASIVE CV LAB;  Service: Cardiovascular;  Laterality: N/A;   TEE WITHOUT CARDIOVERSION N/A 12/09/2017   Procedure: TRANSESOPHAGEAL ECHOCARDIOGRAM (TEE);  Surgeon: Jodelle Red, MD;  Location: [facility] ENDOSCOPY;  Service: Cardiovascular;  Laterality: N/A;   TEE WITHOUT CARDIOVERSION N/A 01/27/2018   Procedure: TRANSESOPHAGEAL ECHOCARDIOGRAM (TEE);  Surgeon: Purcell Nails, MD;  Location: [facility] OR;  Service: Open Heart Surgery;  Laterality: N/A;   VIDEO BRONCHOSCOPY WITH RADIAL ENDOBRONCHIAL ULTRASOUND  12/02/2022   Procedure: VIDEO BRONCHOSCOPY WITH RADIAL ENDOBRONCHIAL ULTRASOUND;  Surgeon: Leslye Peer, MD;  Location: [facility] ENDOSCOPY;  Service: Pulmonary;;     Allergies: Other  Medications: Prior to Admission medications   Medication Sig Start Date End Date Taking? Authorizing Provider  albuterol (VENTOLIN HFA) 108 (90 Base) MCG/ACT inhaler Inhale 1-2 puffs into the lungs every 6 (six) hours as needed for wheezing or shortness of breath. 12/09/22  Yes Bevelyn Ngo, NP  atorvastatin (LIPITOR) 40 MG tablet Take 1 tablet (40 mg total) by mouth daily. 08/19/22  Yes [name], MD  carvedilol (COREG) 6.25 MG tablet Take 1 tablet (6.25 mg total) by mouth 2 (two) times daily with a meal. 08/19/22  Yes [name], MD  levothyroxine (SYNTHROID) 75 MCG tablet Take 1 tablet (75 mcg total) by mouth daily before breakfast. 11/03/22  Yes [name], MD  meloxicam (MOBIC) 15 MG tablet Take 1 tablet (15 mg total) by mouth every day as needed 12/01/22  Yes   Multiple Vitamin (MULTIVITAMIN WITH MINERALS) TABS tablet Take 1 tablet by mouth daily.   Yes [provider]  sacubitril-valsartan (ENTRESTO) 24-26 MG Take 1 tablet by mouth 2 (two) times daily. 02/07/22 02/07/23 Yes Hilty, Lisette Abu, MD  spironolactone (ALDACTONE) 25 MG tablet Take one-half tablet (12.5 mg total) by mouth daily. 07/21/22 07/21/23 Yes Clent Ridges,  Tera Mater, MD  tamsulosin (FLOMAX) 0.4 MG CAPS capsule Take 1 capsule (0.4 mg total) by mouth daily as needed. 12/01/22  Yes   traZODone (DESYREL) 50 MG tablet Take 1 tablet (50 mg total) by mouth at bedtime as needed for sleep. Patient taking differently: Take 50 mg by mouth at bedtime as needed for sleep. 1.5 or 2 po q night 12/02/22  Yes Byrum, Les Pou, MD  aspirin EC 81 MG tablet Take 81 mg by mouth daily. 11/16/18   [provider]  Cholecalciferol 125 MCG (5000 UT) TABS Take 1 tablet by mouth daily. 07/03/22   Montez Morita, PA-C  sildenafil (VIAGRA) 50 MG tablet Take 1 tablet (50 mg total) by mouth daily as needed for erectile dysfunction. 05/24/20   [name], MD     Family History  Problem Relation Age of Onset    Cancer Other        prostate father hx   Heart disease Mother    Heart disease Father    Colon cancer Neg Hx     Social History   Socioeconomic History   Marital status: Married    Spouse name: Not on file   Number of children: 3   Years of education: Masters   Highest education level: Not on file  Occupational History   Occupation: lawyer    Comment: [employer]  Tobacco Use   Smoking status: Never   Smokeless tobacco: Never  Vaping Use   Vaping status: Never Used  Substance and Sexual Activity   Alcohol use: Yes    Alcohol/week: 3.0 standard drinks of alcohol    Types: 3 Glasses of wine per week   Drug use: No   Sexual activity: Not on file  Other Topics Concern   Not on file  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[place]   Social Determinants of Health   Financial Resource Strain: Not on file  Food Insecurity: No Food Insecurity (10/20/2022)   Hunger Vital Sign    Worried About Running Out of Food in the Last Year: Never true    Ran Out of Food in the Last Year: Never true  Transportation Needs: No Transportation Needs (10/20/2022)   PRAPARE - Administrator, Civil Service (Medical): No    Lack of Transportation (Non-Medical): No  Physical Activity: Not on file  Stress: Not on file  Social Connections: Not on file    Review of Systems: A 12 point ROS discussed and pertinent positives are indicated in the HPI above.  All other systems are negative.  Review of Systems  Constitutional:  Negative for activity change, appetite change and fatigue.  Respiratory:  Negative for chest tightness, shortness of breath and wheezing.   Gastrointestinal:  Negative for abdominal pain.  Psychiatric/Behavioral:  Negative for behavioral problems and confusion.     Vital Signs: BP 135/83   Pulse 79   Temp 97.6 F (36.4 C) (Temporal)   Resp 19   Ht 5\' 11"  (1.803 m)   Wt 180 lb (81.6 kg)   SpO2 98%   BMI 25.10 kg/m    Advance Care Plan: The advanced care plan/surrogate decision maker was discussed at the time of visit and documented in the medical record.    Physical Exam Constitutional:      General: He is not in acute distress.    Appearance: Normal appearance.  HENT:     Mouth/Throat:  Mouth: Mucous membranes are moist.  Cardiovascular:     Rate and Rhythm: Normal rate and regular rhythm.     Heart sounds: No murmur heard. Pulmonary:     Breath sounds: Normal breath sounds. No wheezing.  Skin:    General: Skin is warm and dry.  Neurological:     Mental Status: He is oriented to person, place, and time.  Psychiatric:        Behavior: Behavior normal.        Judgment: Judgment normal.     Imaging: NM PET Image Initial (PI) Skull Base To Thigh  Result Date: 12/08/2022 CLINICAL DATA:  Initial treatment strategy for left upper lobe lung mass. Non-small cell lung cancer. EXAM: NUCLEAR MEDICINE PET SKULL BASE TO THIGH TECHNIQUE: 9.9 mCi F-18 FDG was injected intravenously. Full-ring PET imaging was performed from the skull base to thigh after the radiotracer. CT data was obtained and used for attenuation correction and anatomic localization. Fasting blood glucose: 82 mg/dl COMPARISON:  Chest CT 81/19/1478 FINDINGS: Mediastinal blood pool activity: SUV max 2.32 Liver activity: SUV max NA NECK: No hypermetabolic lymph nodes in the neck. Incidental CT findings: Bilateral carotid artery calcifications. CHEST: The central left upper lobe lung mass invading the mediastinum demonstrates hypermetabolism with SUV max of 19.93 and consistent with known neoplasm. Small right hilar lymph node has an SUV max of 5.24 and likely represents metastatic disease. No hypermetabolic supraclavicular or axillary nodes. No pulmonary nodules to suggest pulmonary metastatic disease. Incidental CT findings: Stable marked cardiac enlargement. Stable pacer wires and vascular calcifications. ABDOMEN/PELVIS: Bilateral adrenal  gland masses are hypermetabolic and consistent with metastatic disease. The right adrenal gland lesion has an SUV max of 15.22 and the left 20.35. Hypermetabolic splenic lesions, difficult to identify on the CT scan. The upper pole lesion has an SUV max of 13.58 and the lateral and more inferior lesion has an SUV max of 20.35. No hepatic metastatic lesions. The pancreas and kidneys are. No hypermetabolic abdominal pelvic lymph nodes. Incidental CT findings: Cholelithiasis noted. Scattered aortic and iliac artery calcifications but no aneurysm. Mild prostate gland enlargement. Small bilateral inguinal hernias containing fat. SKELETON: Hypermetabolic focus in the right gluteus maximus muscle has an SUV max of 10.56 and is likely a muscle metastasis. No hypermetabolic bone lesions to suggest osseous metastatic disease. Incidental CT findings: None. IMPRESSION: 1. Hypermetabolic central left upper lobe lung mass invading the mediastinum consistent with known neoplasm. 2. Small hypermetabolic right hilar lymph node likely represents metastatic disease. 3. Bilateral adrenal gland masses and splenic lesions consistent with metastatic disease. 4. Hypermetabolic focus in the right gluteus maximus muscle is likely a muscle metastasis. 5. No findings for osseous metastatic disease. Aortic Atherosclerosis (ICD10-I70.0). Electronically Signed   By: Rudie Meyer M.D.   On: 12/08/2022 12:12   CT Chest W Contrast  Result Date: 12/02/2022 CLINICAL DATA:  Chronic cough. Chronic left upper lobe lung opacity. History of CHF. Hodgkin's lymphoma 22 years ago with radiation therapy and chemotherapy. * Tracking Code: BO * EXAM: CT CHEST WITH CONTRAST TECHNIQUE: Multidetector CT imaging of the chest was performed during intravenous contrast administration. RADIATION DOSE REDUCTION: This exam was performed according to the departmental dose-optimization program which includes automated exposure control, adjustment of the mA and/or kV  according to patient size and/or use of iterative reconstruction technique. CONTRAST:  75mL ISOVUE-300 IOPAMIDOL (ISOVUE-300) INJECTION 61% COMPARISON:  Plain films including 10/30/2022. Chest CT of 04/23/2018 also reviewed. FINDINGS: Cardiovascular: Aortic atherosclerosis. Median sternotomy for CABG.  Mitral valve repair. Pacer. Moderate cardiomegaly. No pericardial effusion. No central pulmonary embolism, on this non-dedicated study. Mediastinum/Nodes: No supraclavicular adenopathy. Left infrahilar node is mildly enlarged at 8 mm on 66/2. No separate mediastinal adenopathy. Lungs/Pleura: No pleural fluid. Scattered millimetric pulmonary nodules, primarily similar and some calcified, considered benign. There is a right lower lobe 5 mm nodule on 123/5 which is new since the prior CT. Corresponding to the plain film abnormality, within the central left upper lobe, is a spiculated lung mass which measures 4.1 x 3.2 cm on 48/5. This invades the anterior mediastinum, intimately associated with the descending aorta and pulmonary outflow tract including on images 48/2 and 52/2 respectively. Upper Abdomen: Normal imaged portions of the liver, spleen, stomach, pancreas, gallbladder, kidneys. Development of left-greater-than-right adrenal masses. The left-sided mass is heterogeneous and necrotic. Example 5.0 x 3.1 cm on 153/2. Gastrohepatic ligament adenopathy is mild at 9 mm on 146/2, improved from 14 mm on 04/23/2018. Musculoskeletal: New T4 sclerotic lesion of 7 mm on sagittal image 108. IMPRESSION: 1. 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 2. Development of bilateral adrenal masses, most consistent with metastasis. 3. Nonspecific right lower lobe 5 mm nodule, new since 2020. 4. Mild left infrahilar adenopathy for which isolated nodal metastasis is a concern. 5. Mild gastrohepatic ligament adenopathy, improved since 04/23/2018. Indeterminate. 6. New T4 7 mm sclerotic lesion could  represent a bone island or isolated osseous metastasis. Recommend attention on follow-up. Electronically Signed   By: Jeronimo Greaves M.D.   On: 12/02/2022 13:55   DG CHEST PORT 1 VIEW  Result Date: 12/02/2022 CLINICAL DATA:  Status post bronchoscopy EXAM: PORTABLE CHEST 1 VIEW COMPARISON:  Plain film of earlier today.  CT 11/18/2022. FINDINGS: Pacer. Median sternotomy for CABG. Mitral valve repair. Midline trachea. Mild cardiomegaly. No pleural effusion or pneumothorax. Left suprahilar lung mass again identified. Clear right lung. No congestive failure. IMPRESSION: No pneumothorax or other acute complication after bronchoscopy. Left suprahilar lung mass, as before. Electronically Signed   By: Jeronimo Greaves M.D.   On: 12/02/2022 13:42   DG Chest Port 1 View  Result Date: 12/02/2022 CLINICAL DATA:  Status post bronchoscopy. EXAM: PORTABLE CHEST 1 VIEW COMPARISON:  Radiograph 10/30/2022, CT 11/18/2022 FINDINGS: No pneumothorax evident post bronchoscopy. LEFT suprahilar mass again noted. No pleural fluid.  No atelectasis. LEFT-sided pacer IMPRESSION: 1. No pneumothorax post bronchoscopy. 2. LEFT suprahilar mass. Electronically Signed   By: Genevive Bi M.D.   On: 12/02/2022 11:06   DG C-ARM BRONCHOSCOPY  Result Date: 12/02/2022 C-ARM BRONCHOSCOPY: Fluoroscopy was utilized by the requesting physician.  No radiographic interpretation.   DG C-Arm 1-60 Min-No Report  Result Date: 12/02/2022 Fluoroscopy was utilized by the requesting physician.  No radiographic interpretation.    Labs:  CBC: Recent Labs    10/14/22 0959 11/03/22 0902 12/10/22 1302 12/16/22 0659  WBC 8.1 8.7 7.1 7.4  HGB 12.4* 11.1* 12.2* 11.9*  HCT 37.5* 32.9* 36.3* 36.0*  PLT 282 388.0 287 252    COAGS: Recent Labs    06/30/22 1845  INR 1.1    BMP: Recent Labs    07/02/22 0431 07/03/22 0134 08/19/22 0839 10/14/22 0959 11/03/22 0902 12/10/22 1302  NA 131* 134* 140 138 138 140  K 4.8 4.6 4.9 4.5 4.6 5.0   CL 99 101 103 102 102 106  CO2 25 23 30 26 28 28   GLUCOSE 146* 121* 106* 111* 119* 104*  BUN 26* 34* 16 13 19  18  CALCIUM 8.2* 8.4* 10.2 9.4 9.3 9.7  CREATININE 1.51* 1.44* 1.09 1.13 1.12 1.22  GFRNONAA 51* 54*  --  >60  --  >60    LIVER FUNCTION TESTS: Recent Labs    08/19/22 0839 12/10/22 1302  BILITOT 0.6 0.6  AST 16 17  ALT 13 16  ALKPHOS 142* 76  PROT 7.1 7.0  ALBUMIN 4.6 4.4    TUMOR MARKERS: No results for input(s): "AFPTM", "CEA", "CA199", "CHROMGRNA" in the last 8760 hours.  Assessment and Plan:  Patient presents for left adrenal mass biopsy in IR today Risks and benefits of left adrenal mass biopsy was discussed with the patient and/or patient's family including, but not limited to bleeding, infection, damage to adjacent structures or low yield requiring additional tests.  All of the questions were answered and there is agreement to proceed.  Consent signed and in chart.   Thank you for this interesting consult.  I greatly enjoyed meeting Tyler Hendrix and look forward to participating in their care.  A copy of this report was sent to the requesting provider on this date.  Electronically Signed: Robet Leu, PA-C 12/16/2022, 7:12 AM   I spent a total of  30 Minutes   in face to face in clinical consultation, greater than 50% of which was counseling/coordinating care for left adrenal mass biopsy

## 2022-12-16 NOTE — Procedures (Signed)
Interventional Radiology Procedure Note  Procedure: CT guided biopsy of left adrenal mass.  Complications: None EBL: None Recommendations: - Bedrest 1 hours.   - Routine wound care - Follow up pathology - Advance diet  - ok to restart home meds - no heavy lifting x 48 hours (10lbs)   Signed,  Gilmer Mor, DO

## 2022-12-17 ENCOUNTER — Ambulatory Visit (HOSPITAL_COMMUNITY): Admit: 2022-12-17 | Payer: 59 | Admitting: Surgery

## 2022-12-17 SURGERY — XI ROBOTIC ASSISTED INGUINAL HERNIA
Anesthesia: General | Laterality: Bilateral

## 2022-12-19 ENCOUNTER — Encounter: Payer: Self-pay | Admitting: Internal Medicine

## 2022-12-19 LAB — SURGICAL PATHOLOGY

## 2022-12-19 NOTE — Progress Notes (Unsigned)
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 He was diagnosed in October 2024.  He presented with metabolic central left upper lobe lung mass invading the mediastinum, small hypermetabolic right hilar lymph nodes, bilateral adrenal gland masses and splenic lesion consistent metastatic disease, hypermetabolic focus in the right gluteus maximus muscle.   Molecular studies by [employer] show MSI high.   [facility] requested on 12/22/22  PRIOR THERAPY: None  CURRENT THERAPY: Immunotherapy with Keytruda 200 mg IV every 3 weeks.  First dose expected on 12/29/22  INTERVAL HISTORY: Tyler Hendrix 67 y.o. male returns to the clinic today for follow-up visit accompanied by his wife.  The patient is a never smoker was recently found to have metastatic cancer pending tissue diagnosis.  He establish care in the clinic on 12/10/2022.  He underwent a CT-guided biopsy of the adrenal gland and the final pathology showed poorly differentiated carcinoma, felt to be non-small cell lung cancer.  He is here today to discuss his current condition and treatment options.  Since last being seen he denies any major changes in his health except he feels he may be mildly more short of breath and weaker but overall is feeling fair.  Denies any fever, chills, or night sweats.  He did lose approximately 15 pounds since May 2024 but he also had underwent surgery for the fracture in the femur status post rod placement.  He gained a few pounds since last being seen. At his last appointment, he was reporting taking a deep breath would trigger a coughing spell.  Overall, his cough is better than his last appointment. He also has some shortness of breath. He denies any chest pain.  He denies any hemoptysis.  He denies any nausea, vomiting, diarrhea, or constipation.  Denies any  headache or visual changes.  Denies any unusual back or abdominal pain.  He had a head CT for staging and the results are not available at this time.  He is here today for evaluation and to review his pathology results and for more detailed discussion about his treatment options.    MEDICAL HISTORY: Past Medical History:  Diagnosis Date   Abnormal TSH    Arthritis    ddd   Atrial fibrillation and flutter (HCC)    s/p DCCV 03/26/2018   CAD in native artery    a. 11/2015: s/p 2 vessel PCI of the first diagonal branch of the LAD in the mid left circumflex with DES. // s/p CABG 01/2018   Chronic combined systolic and diastolic CHF (congestive heart failure) (HCC)    sees Dr. Johney Frame    Complete heart block (HCC)    a. s/p Medtronic ppm 10/2015.   Dyspnea    with exertion   History of radiation therapy 2002   Hodgkin disease (HCC) 2002   a. s/p chest radiation and chemotherapy   Hypertension    Hypothyroidism    sees Dr. 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  MEDICATIONS:  Current Outpatient Medications  Medication Sig Dispense Refill   albuterol (VENTOLIN HFA) 108 (90 Base) MCG/ACT inhaler Inhale 1-2 puffs into the lungs every 6 (six) hours as needed for wheezing or shortness of breath. 8 g 2   aspirin EC 81 MG tablet Take 81 mg by mouth daily.     atorvastatin (LIPITOR) 40 MG tablet Take 1 tablet (40 mg total) by mouth daily. 90 tablet 3   carvedilol (COREG) 6.25 MG  tablet Take 1 tablet (6.25 mg total) by mouth 2 (two) times daily with a meal. 180 tablet 3   Cholecalciferol 125 MCG (5000 UT) TABS Take 1 tablet by mouth daily. 30 tablet 6   levothyroxine (SYNTHROID) 75 MCG tablet Take 1 tablet (75 mcg total) by mouth daily before breakfast. 90 tablet 3   meloxicam (MOBIC) 15 MG tablet Take 1 tablet (15 mg total) by mouth every day as needed 30 tablet 1   Multiple Vitamin (MULTIVITAMIN WITH MINERALS) TABS tablet Take 1 tablet by mouth daily.     sacubitril-valsartan (ENTRESTO) 24-26 MG Take 1 tablet by mouth 2 (two) times daily. 180 tablet 3   sildenafil (VIAGRA) 50 MG tablet Take 1 tablet (50 mg total) by mouth daily as needed for erectile dysfunction. 30 tablet 3   spironolactone (ALDACTONE) 25 MG tablet Take one-half tablet (12.5 mg total) by mouth daily. 45 tablet 3   tamsulosin (FLOMAX) 0.4 MG CAPS capsule Take 1 capsule (0.4 mg total) by mouth daily as needed. 30 capsule 1   traZODone (DESYREL) 50 MG tablet Take 1 tablet (50 mg total) by mouth at bedtime as needed for sleep. (Patient taking differently: Take 50 mg by mouth at bedtime as needed for sleep. 1.5 or 2 po q night)     No current facility-administered medications for this visit.    SURGICAL HISTORY:  Past Surgical History:  Procedure Laterality Date   A-FLUTTER ABLATION N/A 06/20/2021   Procedure: A-FLUTTER ABLATION;  Surgeon: Marinus Maw, MD;  Location: [facility] INVASIVE CV LAB;  Service: Cardiovascular;  Laterality: N/A;   BIV UPGRADE N/A 02/08/2019   Procedure: UPGRADE TO BIV PPM;  Surgeon: Hillis Range, MD;  Location: [facility] INVASIVE CV LAB;  Service: Cardiovascular;  Laterality: N/A;   BRONCHIAL BIOPSY  12/02/2022   Procedure: BRONCHIAL BIOPSIES;  Surgeon: Leslye Peer, MD;  Location: [facility] ENDOSCOPY;  Service: Pulmonary;;   BRONCHIAL BRUSHINGS  12/02/2022   Procedure: BRONCHIAL BRUSHINGS;  Surgeon: Leslye Peer, MD;  Location: [facility] ENDOSCOPY;  Service: Pulmonary;;   BRONCHIAL NEEDLE  ASPIRATION BIOPSY  12/02/2022   Procedure: BRONCHIAL NEEDLE ASPIRATION BIOPSIES;  Surgeon: Leslye Peer, MD;  Location: [facility] ENDOSCOPY;  Service: Pulmonary;;   BRONCHIAL WASHINGS  12/02/2022   Procedure: BRONCHIAL WASHINGS;  Surgeon: Leslye Peer, MD;  Location: [facility] ENDOSCOPY;  Service: Pulmonary;;   CARDIAC CATHETERIZATION N/A 11/02/2015   Procedure: Left Heart Cath and Coronary Angiography;  Surgeon: Tonny Bollman, MD;  Location: [facility] INVASIVE CV LAB;  Service: Cardiovascular;  Laterality: N/A;   CARDIAC CATHETERIZATION N/A 11/21/2015   Procedure: Coronary Stent Intervention;  Surgeon: Tonny Bollman, MD;  Location: [facility] INVASIVE CV LAB;  Service: Cardiovascular;  Laterality: N/A;   CARDIAC CATHETERIZATION  11/2017   CARDIOVERSION N/A 03/26/2018   Procedure: CARDIOVERSION;  Surgeon: Weston Brass  A, MD;  Location: [facility] ENDOSCOPY;  Service: Cardiovascular;  Laterality: N/A;   COLONOSCOPY  01/16/2015   per Dr. Christella Hartigan, benign polyps, repeat in 10 yrs    CORONARY ARTERY BYPASS GRAFT N/A 01/27/2018   Procedure: CORONARY ARTERY BYPASS GRAFTING (CABG) x two, using left internal mammary artery and right leg greater saphenous vein harvested endoscopically;  Surgeon: Purcell Nails, MD;  Location: [facility] OR;  Service: Open Heart Surgery;  Laterality: N/A;   EP IMPLANTABLE DEVICE N/A 11/02/2015   MDT Adivisa MRI conditional dual chamber pacemaker implanted by Dr Johney Frame for complete heart block   FEMUR IM NAIL Left 07/01/2022   Procedure: INTRAMEDULLARY (IM) NAIL FEMORAL LEFT ANTEGRADE;  Surgeon: Myrene Galas, MD;  Location: [facility] OR;  Service: Orthopedics;  Laterality: Left;   KNEE SURGERY Right 1972   LEAD EXTRACTION N/A 10/20/2022   Procedure: LEAD EXTRACTION;  Surgeon: Marinus Maw, MD;  Location: [facility] INVASIVE CV LAB;  Service: Cardiovascular;  Laterality: N/A;   LUMBAR LAMINECTOMY  1996   MITRAL VALVE REPLACEMENT N/A 01/27/2018   Procedure: MITRAL VALVE (MV) REPLACEMENT using Magna Mitral Ease Valve  Size ;  Surgeon: Purcell Nails, MD;  Location: [facility] OR;  Service: Open Heart Surgery;  Laterality: N/A;   RIGHT/LEFT HEART CATH AND CORONARY ANGIOGRAPHY N/A 12/02/2017   Procedure: RIGHT/LEFT HEART CATH AND CORONARY ANGIOGRAPHY;  Surgeon: Corky Crafts, MD;  Location: [facility] INVASIVE CV LAB;  Service: Cardiovascular;  Laterality: N/A;   TEE WITHOUT CARDIOVERSION N/A 12/09/2017   Procedure: TRANSESOPHAGEAL ECHOCARDIOGRAM (TEE);  Surgeon: Jodelle Red, MD;  Location: [facility] ENDOSCOPY;  Service: Cardiovascular;  Laterality: N/A;   TEE WITHOUT CARDIOVERSION N/A 01/27/2018   Procedure: TRANSESOPHAGEAL ECHOCARDIOGRAM (TEE);  Surgeon: Purcell Nails, MD;  Location: [facility] OR;  Service: Open Heart Surgery;  Laterality: N/A;   VIDEO BRONCHOSCOPY WITH RADIAL ENDOBRONCHIAL ULTRASOUND  12/02/2022   Procedure: VIDEO BRONCHOSCOPY WITH RADIAL ENDOBRONCHIAL ULTRASOUND;  Surgeon: Leslye Peer, MD;  Location: [facility] ENDOSCOPY;  Service: Pulmonary;;    REVIEW OF SYSTEMS:   Constitutional: Positive for weight loss since May following surgery but gained weight since last being seen. Negative for chills, fatigue, or fever. HENT: Negative for mouth sores, nosebleeds, sore throat and trouble swallowing.   Eyes: Negative for eye problems and icterus.  Respiratory: Positive for shortness of breath. Improved cough. Negative for hemoptysis and wheezing.   Cardiovascular: Negative for chest pain and leg swelling.  Gastrointestinal: Negative for abdominal pain, constipation, diarrhea, nausea and vomiting.  Genitourinary: Negative for bladder incontinence, difficulty urinating, dysuria, frequency and hematuria.   Musculoskeletal: Negative for back pain, gait problem, neck pain and neck stiffness.  Skin: Negative for itching and rash.  Neurological: Negative for dizziness, extremity weakness, gait problem, headaches, light-headedness and seizures.  Hematological: Negative for adenopathy. Does not bruise/bleed  easily.  Psychiatric/Behavioral: Negative for confusion, depression and sleep disturbance. The patient is not nervous/anxious.     PHYSICAL EXAMINATION:  There were no vitals taken for this visit.  ECOG PERFORMANCE STATUS: 1  Physical Exam  Constitutional: Oriented to person, place, and time and well-developed, well-nourished, and in no distress.  HENT:  Head: Normocephalic and atraumatic.  Mouth/Throat: Oropharynx is clear and moist. No oropharyngeal exudate.  Eyes: Conjunctivae are normal. Right eye exhibits no discharge. Left eye exhibits no discharge. No scleral icterus.  Neck: Normal range of motion. Neck supple.  Cardiovascular: Normal rate, regular rhythm, normal heart sounds and intact distal pulses.   Pulmonary/Chest: Effort normal and breath sounds normal.  No respiratory distress. No wheezes. No rales.  Abdominal: Soft. Bowel sounds are normal. Exhibits no distension and no mass. There is no tenderness.  Musculoskeletal: Normal range of motion. Exhibits no edema.  Lymphadenopathy:    No cervical adenopathy.  Neurological: Alert and oriented to person, place, and time. Exhibits normal muscle tone. Gait normal. Coordination normal.  Skin: Skin is warm and dry. No rash noted. Not diaphoretic. No erythema. No pallor.  Psychiatric: Mood, memory and judgment normal.  Vitals reviewed.  LABORATORY DATA: Lab Results  Component Value Date   WBC 7.4 12/16/2022   HGB 11.9 (L) 12/16/2022   HCT 36.0 (L) 12/16/2022   MCV 94.0 12/16/2022   PLT 252 12/16/2022      Chemistry      Component Value Date/Time   NA 140 12/10/2022 1302   NA 137 06/06/2021 0919   K 5.0 12/10/2022 1302   CL 106 12/10/2022 1302   CO2 28 12/10/2022 1302   BUN 18 12/10/2022 1302   BUN 19 06/06/2021 0919   CREATININE 1.22 12/10/2022 1302   CREATININE 1.04 01/01/2016 0801      Component Value Date/Time   CALCIUM 9.7 12/10/2022 1302   ALKPHOS 76 12/10/2022 1302   AST 17 12/10/2022 1302   ALT 16  12/10/2022 1302   BILITOT 0.6 12/10/2022 1302       RADIOGRAPHIC STUDIES:  CT US GUIDED BIOPSY  Result Date: 12/16/2022 INDICATION: 67 year old male presents for biopsy of FDG avid left adrenal gland mass EXAM: LEFT ADRENAL GLAND MASS BIOPSY, CT-GUIDED MEDICATIONS: None. ANESTHESIA/SEDATION: Moderate (conscious) sedation was employed during this procedure. A total of Versed 1.0 mg and Fentanyl 75 mcg was administered intravenously by the radiology nurse. Total intra-service moderate Sedation Time: 12 minutes. The patient's level of consciousness and vital signs were monitored continuously by radiology nursing throughout the procedure under my direct supervision. COMPLICATIONS: None PROCEDURE: Informed written consent was obtained from the patient after a thorough discussion of the procedural risks, benefits and alternatives. All questions were addressed. Maximal Sterile Barrier Technique was utilized including caps, mask, sterile gowns, sterile gloves, sterile drape, hand hygiene and skin antiseptic. A timeout was performed prior to the initiation of the procedure. Patient was positioned left decubitus on the CT gantry table. Scout CT acquired for planning purposes. The patient was prepped and draped in the usual sterile fashion. 1% lidocaine was used for local anesthesia. Using CT guidance, 17 gauge guide needle was advanced into the edge of the left adrenal mass. Once we confirmed needle tip position multiple 18 gauge core biopsy were acquired. Needle was removed a after a single Gel-Foam pledget, nd a final CT was acquired. Patient tolerated the procedure well and remained hemodynamically stable throughout. No complications were encountered and no significant blood loss. IMPRESSION: Status post CT-guided biopsy of left adrenal mass. Signed, Yvone Neu. Miachel Roux, RPVI [facility] Radiology Electronically Signed   By: Gilmer Mor D.O.   On:  12/16/2022 10:10   NM PET Image Initial (PI) Skull Base To Thigh  Result Date: 12/08/2022 CLINICAL DATA:  Initial treatment strategy for left upper lobe lung mass. Non-small cell lung cancer. EXAM: NUCLEAR MEDICINE PET SKULL BASE TO THIGH TECHNIQUE: 9.9 mCi F-18 FDG was injected intravenously. Full-ring PET imaging was performed from the skull base to thigh after the radiotracer. CT data was obtained and used for attenuation correction and anatomic localization. Fasting blood glucose: 82 mg/dl COMPARISON:  Chest CT 16/11/9602 FINDINGS: Mediastinal blood  pool activity: SUV max 2.32 Liver activity: SUV max NA NECK: No hypermetabolic lymph nodes in the neck. Incidental CT findings: Bilateral carotid artery calcifications. CHEST: The central left upper lobe lung mass invading the mediastinum demonstrates hypermetabolism with SUV max of 19.93 and consistent with known neoplasm. Small right hilar lymph node has an SUV max of 5.24 and likely represents metastatic disease. No hypermetabolic supraclavicular or axillary nodes. No pulmonary nodules to suggest pulmonary metastatic disease. Incidental CT findings: Stable marked cardiac enlargement. Stable pacer wires and vascular calcifications. ABDOMEN/PELVIS: Bilateral adrenal gland masses are hypermetabolic and consistent with metastatic disease. The right adrenal gland lesion has an SUV max of 15.22 and the left 20.35. Hypermetabolic splenic lesions, difficult to identify on the CT scan. The upper pole lesion has an SUV max of 13.58 and the lateral and more inferior lesion has an SUV max of 20.35. No hepatic metastatic lesions. The pancreas and kidneys are. No hypermetabolic abdominal pelvic lymph nodes. Incidental CT findings: Cholelithiasis noted. Scattered aortic and iliac artery calcifications but no aneurysm. Mild prostate gland enlargement. Small bilateral inguinal hernias containing fat. SKELETON: Hypermetabolic focus in the right gluteus maximus muscle has an  SUV max of 10.56 and is likely a muscle metastasis. No hypermetabolic bone lesions to suggest osseous metastatic disease. Incidental CT findings: None. IMPRESSION: 1. Hypermetabolic central left upper lobe lung mass invading the mediastinum consistent with known neoplasm. 2. Small hypermetabolic right hilar lymph node likely represents metastatic disease. 3. Bilateral adrenal gland masses and splenic lesions consistent with metastatic disease. 4. Hypermetabolic focus in the right gluteus maximus muscle is likely a muscle metastasis. 5. No findings for osseous metastatic disease. Aortic Atherosclerosis (ICD10-I70.0). Electronically Signed   By: Rudie Meyer M.D.   On: 12/08/2022 12:12   DG CHEST PORT 1 VIEW  Result Date: 12/02/2022 CLINICAL DATA:  Status post bronchoscopy EXAM: PORTABLE CHEST 1 VIEW COMPARISON:  Plain film of earlier today.  CT 11/18/2022. FINDINGS: Pacer. Median sternotomy for CABG. Mitral valve repair. Midline trachea. Mild cardiomegaly. No pleural effusion or pneumothorax. Left suprahilar lung mass again identified. Clear right lung. No congestive failure. IMPRESSION: No pneumothorax or other acute complication after bronchoscopy. Left suprahilar lung mass, as before. Electronically Signed   By: Jeronimo Greaves M.D.   On: 12/02/2022 13:42   DG Chest Port 1 View  Result Date: 12/02/2022 CLINICAL DATA:  Status post bronchoscopy. EXAM: PORTABLE CHEST 1 VIEW COMPARISON:  Radiograph 10/30/2022, CT 11/18/2022 FINDINGS: No pneumothorax evident post bronchoscopy. LEFT suprahilar mass again noted. No pleural fluid.  No atelectasis. LEFT-sided pacer IMPRESSION: 1. No pneumothorax post bronchoscopy. 2. LEFT suprahilar mass. Electronically Signed   By: Genevive Bi M.D.   On: 12/02/2022 11:06   DG C-ARM BRONCHOSCOPY  Result Date: 12/02/2022 C-ARM BRONCHOSCOPY: Fluoroscopy was utilized by the requesting physician.  No radiographic interpretation.   DG C-Arm 1-60 Min-No Report  Result Date:  12/02/2022 Fluoroscopy was utilized by the requesting physician.  No radiographic interpretation.     ASSESSMENT/PLAN:  This is a very pleasant 67 year old never smoker Caucasian male with stage IV lung cancer, poorly differentiated carcinoma, felt to be non-small cell lung cancer. He was diagnosed in October 2024.  He presented with metabolic central left upper lobe lung mass invading the mediastinum, small hypermetabolic right hilar lymph nodes, bilateral adrenal gland masses and splenic lesion consistent metastatic disease, hypermetabolic focus in the right gluteus maximus muscle.  His molecular studies show he is MSI high  The patient was seen with Dr.  Mohamed today.  Dr. Arbutus Ped had a lengthy discussion with the patient today about his current condition and recommended treatment options.  Dr. Arbutus Ped recommended immunotherapy given his MSI high with keytruda 200 mg IV every 3 weeks. He is expected to undergo treatment next week on 12/29/2022.  The adverse side effects of treatment were discussed including but not limited to myelosuppression, fatigue, risk of infection, nausea, vomiting, diarrhea, kidney, or liver dysfunction.  I will arrange for a chemo education class prior to starting his first cycle of treatment.   I sent a prescription for Compazine 10 mg p.o. every 6 hours as needed to the patient's pharmacy for nausea and vomiting. I also sent EMLA cream for his port-a-cath  We will see him back for a follow-up visit in 4 weeks for evaluation and repeat blood work before undergoing cycle #2.   Patient also has a history of lymphoma and he did have radiation to the chest for Hodgkin's lymphoma in 2002 and chemotherapy.  He was previously followed by Dr. Myna Hidalgo.  His head CT scan is pending.  I will call him if there are any abnormalities.  He is interested in a port-a-cath.   The patient was advised to call immediately if he has any concerning symptoms in the interval. The patient  voices understanding of current disease status and treatment options and is in agreement with the current care plan. All questions were answered. The patient knows to call the clinic with any problems, questions or concerns. We can certainly see the patient much sooner if necessary    No orders of the defined types were placed in this encounter.     Ronin Rehfeldt L Page Pucciarelli, PA-C 12/19/22  ADDENDUM: Hematology/Oncology Attending: I had a face-to-face encounter with the patient today.  I reviewed her records, lab, biopsy results and imaging studies and recommended his care plan.  This is a very pleasant 67 years old white male recently diagnosed with stage IV (T2b, N3, M1c) non-small cell lung cancer, poorly differentiated carcinoma presented with central left upper lobe lung mass invading the mediastinum in addition to right hilar and metastatic disease to the bilateral adrenal glands as well as suspicious hypermetabolic focus in the right gluteus maximus muscle diagnosed in October 2024. The patient had molecular studies by [employer] that showed no actionable mutations but positive MSI high.  Patient had CT scan of the head with and without contrast that showed no evidence of metastatic disease to the brain. I had a lengthy discussion with the patient and his wife today about his current condition and treatment options.  We will send his tissue biopsy to [facility] for molecular studies and PD-L1 expression. I discussed with the patient his treatment options and in the presence of MSI high he will be a good candidate for treatment with single agent immunotherapy with pembrolizumab 200 Mg IV every 3 weeks. I discussed with the patient the benefit as well as the adverse effect of the treatment with immunotherapy with pembrolizumab. He was reminded of the adverse effect of this treatment including but not limited to immunotherapy mediated skin rash, diarrhea, inflammation of the lung, kidney,  liver, thyroid or other endocrine dysfunction.  We will provide the patient with handout about pembrolizumab and the highly expected adverse effects. He is expected to have the first cycle of this treatment next week. He will have a chemotherapy education class before the first dose of his treatment. The patient will come back for follow-up visit in 4 weeks  for evaluation before the start of cycle #2. He was advised to call immediately if he has any other concerning symptoms in the interval. The total time spent in the appointment was 40 minutes. Disclaimer: This note was dictated with voice recognition software. Similar sounding words can inadvertently be transcribed and may be missed upon review. Lajuana Matte, MD

## 2022-12-22 ENCOUNTER — Other Ambulatory Visit: Payer: Self-pay | Admitting: Physician Assistant

## 2022-12-22 DIAGNOSIS — C799 Secondary malignant neoplasm of unspecified site: Secondary | ICD-10-CM

## 2022-12-22 LAB — GUARDANT 360

## 2022-12-23 ENCOUNTER — Other Ambulatory Visit (HOSPITAL_COMMUNITY): Payer: Self-pay

## 2022-12-23 ENCOUNTER — Encounter: Payer: Self-pay | Admitting: Internal Medicine

## 2022-12-23 ENCOUNTER — Other Ambulatory Visit: Payer: Self-pay

## 2022-12-23 ENCOUNTER — Inpatient Hospital Stay: Payer: 59 | Attending: Physician Assistant

## 2022-12-23 ENCOUNTER — Inpatient Hospital Stay (HOSPITAL_BASED_OUTPATIENT_CLINIC_OR_DEPARTMENT_OTHER): Payer: 59 | Admitting: Physician Assistant

## 2022-12-23 ENCOUNTER — Telehealth: Payer: Self-pay

## 2022-12-23 VITALS — BP 143/93 | HR 81 | Temp 98.1°F | Resp 16 | Wt 182.3 lb

## 2022-12-23 DIAGNOSIS — C3412 Malignant neoplasm of upper lobe, left bronchus or lung: Secondary | ICD-10-CM | POA: Insufficient documentation

## 2022-12-23 DIAGNOSIS — C7972 Secondary malignant neoplasm of left adrenal gland: Secondary | ICD-10-CM | POA: Insufficient documentation

## 2022-12-23 DIAGNOSIS — C7971 Secondary malignant neoplasm of right adrenal gland: Secondary | ICD-10-CM | POA: Insufficient documentation

## 2022-12-23 DIAGNOSIS — Z8571 Personal history of Hodgkin lymphoma: Secondary | ICD-10-CM | POA: Insufficient documentation

## 2022-12-23 DIAGNOSIS — Z9221 Personal history of antineoplastic chemotherapy: Secondary | ICD-10-CM | POA: Insufficient documentation

## 2022-12-23 DIAGNOSIS — Z7189 Other specified counseling: Secondary | ICD-10-CM

## 2022-12-23 DIAGNOSIS — C799 Secondary malignant neoplasm of unspecified site: Secondary | ICD-10-CM | POA: Diagnosis not present

## 2022-12-23 DIAGNOSIS — C3492 Malignant neoplasm of unspecified part of left bronchus or lung: Secondary | ICD-10-CM | POA: Insufficient documentation

## 2022-12-23 DIAGNOSIS — Z5112 Encounter for antineoplastic immunotherapy: Secondary | ICD-10-CM | POA: Diagnosis present

## 2022-12-23 LAB — CBC WITH DIFFERENTIAL (CANCER CENTER ONLY)
Abs Immature Granulocytes: 0.03 10*3/uL (ref 0.00–0.07)
Basophils Absolute: 0.1 10*3/uL (ref 0.0–0.1)
Basophils Relative: 1 %
Eosinophils Absolute: 0.2 10*3/uL (ref 0.0–0.5)
Eosinophils Relative: 3 %
HCT: 39.6 % (ref 39.0–52.0)
Hemoglobin: 13.1 g/dL (ref 13.0–17.0)
Immature Granulocytes: 0 %
Lymphocytes Relative: 14 %
Lymphs Abs: 1 10*3/uL (ref 0.7–4.0)
MCH: 31.6 pg (ref 26.0–34.0)
MCHC: 33.1 g/dL (ref 30.0–36.0)
MCV: 95.7 fL (ref 80.0–100.0)
Monocytes Absolute: 0.5 10*3/uL (ref 0.1–1.0)
Monocytes Relative: 6 %
Neutro Abs: 5.4 10*3/uL (ref 1.7–7.7)
Neutrophils Relative %: 76 %
Platelet Count: 272 10*3/uL (ref 150–400)
RBC: 4.14 MIL/uL — ABNORMAL LOW (ref 4.22–5.81)
RDW: 13.4 % (ref 11.5–15.5)
WBC Count: 7.2 10*3/uL (ref 4.0–10.5)
nRBC: 0 % (ref 0.0–0.2)

## 2022-12-23 LAB — CMP (CANCER CENTER ONLY)
ALT: 15 U/L (ref 0–44)
AST: 18 U/L (ref 15–41)
Albumin: 4.6 g/dL (ref 3.5–5.0)
Alkaline Phosphatase: 79 U/L (ref 38–126)
Anion gap: 5 (ref 5–15)
BUN: 19 mg/dL (ref 8–23)
CO2: 29 mmol/L (ref 22–32)
Calcium: 9.8 mg/dL (ref 8.9–10.3)
Chloride: 105 mmol/L (ref 98–111)
Creatinine: 1.17 mg/dL (ref 0.61–1.24)
GFR, Estimated: >60 mL/min (ref >60)
Glucose, Bld: 99 mg/dL (ref 70–99)
Potassium: 4.8 mmol/L (ref 3.5–5.1)
Sodium: 139 mmol/L (ref 135–145)
Total Bilirubin: 0.6 mg/dL (ref <1.2)
Total Protein: 7.2 g/dL (ref 6.5–8.1)

## 2022-12-23 MED ORDER — ONDANSETRON HCL 8 MG PO TABS
8.0000 mg | ORAL_TABLET | Freq: Three times a day (TID) | ORAL | 2 refills | Status: DC | PRN
  Filled 2022-12-23: qty 30, 10d supply, fill #0

## 2022-12-23 MED ORDER — LIDOCAINE-PRILOCAINE 2.5-2.5 % EX CREA
1.0000 | TOPICAL_CREAM | CUTANEOUS | 2 refills | Status: DC | PRN
  Filled 2022-12-23: qty 30, 30d supply, fill #0

## 2022-12-23 NOTE — Telephone Encounter (Signed)
Spoke with pt in regards to below.  Pt verbalized understanding and voiced happiness with the news.

## 2022-12-23 NOTE — Patient Instructions (Addendum)
Summary:  -There are two main categories of lung cancer, they are named based on the size of the cancer cell. One is called Non-Small cell lung cancer. 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 This is the most common type of lung cancer.  -We covered a lot of important information at your appointment today regarding what the treatment plan is moving forward. Here are the the main points that were discussed at your office visit with [place] today:  -The blood test came back showing MSI high. We are also running another test on the tissue from the adrenal gland biopsy  -The treatment with one immunotherapy drug called Keytruda (pembrolizumab) given the MSI high.  -We are planning on starting your treatment next week on 12/29/22 but before your start your treatment, I would like you to attend a Chemotherapy Education Class. This involves having you sit down with one of our nurse educators. She will discuss with your one-on-one more details about your treatment as well as general information about resources here at the cancer center.  -Your treatment will be given once every 3 weeks.  -We will get a CT scan after 3 treatments to check on the progress of treatment -Adverse effect of the immunotherapy including but not limited to immunotherapy mediated skin rash, diarrhea, inflammation of the lung, kidney, liver, thyroid or other endocrine dysfunction   Medications:  -I have sent a few important medication prescriptions to your pharmacy.  -Compazine was sent to your pharmacy. This medication is for nausea. You may take this every 6 hours as needed if you feel nauseous.  -I send numbing cream to the pharmacy for the port   Referrals or Imaging: -The results of the head CT scan are pending. If any concerning findings, then we will call you.  -I placed referral for a port-a-cath.  They will call you from the IR department to schedule this. You do NOT need to have the port before the first treatment. Therefore, they may schedule this after the first infusion based on availability and we do not need to delay starting treatment.    Follow up:  -We will see you back for a follow up visit 4 weeks with the second cycle of treatment  -If you need to reach [place] at any time, the main office number to the cancer center is [phone], when you call, ask to speak to either Cassie's or Dr. Asa Lente nurse.

## 2022-12-23 NOTE — Progress Notes (Signed)
START OFF PATHWAY REGIMEN - Non-Small Cell Lung   [id number]:Pembrolizumab 200 mg IV D1 q21 Days:   A cycle is every 21 days:     Pembrolizumab   **Always confirm dose/schedule in your pharmacy ordering system**  Patient Characteristics: Stage IV Metastatic, Nonsquamous, Molecular Analysis Completed, Molecular Alteration Present and Targeted Therapy Exhausted OR KRAS G12C+ or HER2+ Present and No Prior Chemo/Immunotherapy OR No Alteration Present, Initial Chemotherapy/Immunotherapy, PS =  0, 1, No Alteration Present, No Alteration Present, Candidate for Immunotherapy, PD-L1 Expression Positive 1-49% (TPS) / Negative / Not Tested / Awaiting Test Results and Immunotherapy Candidate Therapeutic Status: Stage IV Metastatic Histology: Nonsquamous Cell Broad Molecular Profiling Status: Molecular Analysis Completed Molecular Analysis Results: No Alteration Present ECOG Performance Status: 1 Chemotherapy/Immunotherapy Line of Therapy: Initial Chemotherapy/Immunotherapy EGFR Exons 18-21 Mutation Testing Status: Completed and Negative ALK Fusion/Rearrangement Testing Status: Completed and Negative BRAF V600 Mutation Testing Status: Completed and Negative KRAS G12C Mutation Testing Status: Completed and Negative MET Exon 14 Mutation Testing Status: Completed and Negative RET Fusion/Rearrangement Testing Status: Completed and Negative HER2 Mutation Testing Status: Completed and Negative NTRK Fusion/Rearrangement Testing Status: Completed and Negative ROS1 Fusion/Rearrangement Testing Status: Completed and Negative Immunotherapy Candidate Status: Candidate for Immunotherapy PD-L1 Expression Status: Awaiting Test Results Intent of Therapy: Non-Curative / Palliative Intent, Discussed with Patient

## 2022-12-23 NOTE — Telephone Encounter (Signed)
-----   Message from Cassandra L Heilingoetter sent at 12/23/2022 10:41 AM EST ----- Regarding: FW: Can you call him and let him know his CT head is negative. ----- Message ----- From: Interface, Rad Results In Sent: 12/23/2022   9:19 AM EST To: Cassandra L Heilingoetter, PA-C

## 2022-12-23 NOTE — Progress Notes (Signed)
Request for tissue to be sent to [facility] for molecular studies and PD-L1 testing emailed to [facility] and Rudean Curt, [facility] pathology techs.

## 2022-12-24 ENCOUNTER — Encounter: Payer: Self-pay | Admitting: Internal Medicine

## 2022-12-24 ENCOUNTER — Other Ambulatory Visit: Payer: Self-pay | Admitting: Physician Assistant

## 2022-12-24 ENCOUNTER — Inpatient Hospital Stay: Payer: 59

## 2022-12-25 ENCOUNTER — Other Ambulatory Visit: Payer: Self-pay

## 2022-12-29 ENCOUNTER — Encounter: Payer: Self-pay | Admitting: Internal Medicine

## 2022-12-29 ENCOUNTER — Inpatient Hospital Stay: Payer: 59

## 2022-12-29 ENCOUNTER — Other Ambulatory Visit: Payer: Self-pay

## 2022-12-29 VITALS — BP 133/71 | HR 80 | Temp 99.0°F | Resp 17 | Wt 184.0 lb

## 2022-12-29 DIAGNOSIS — C3492 Malignant neoplasm of unspecified part of left bronchus or lung: Secondary | ICD-10-CM

## 2022-12-29 DIAGNOSIS — Z5112 Encounter for antineoplastic immunotherapy: Secondary | ICD-10-CM | POA: Diagnosis not present

## 2022-12-29 LAB — CMP (CANCER CENTER ONLY)
ALT: 15 U/L (ref 0–44)
AST: 18 U/L (ref 15–41)
Albumin: 4.3 g/dL (ref 3.5–5.0)
Alkaline Phosphatase: 67 U/L (ref 38–126)
Anion gap: 6 (ref 5–15)
BUN: 21 mg/dL (ref 8–23)
CO2: 26 mmol/L (ref 22–32)
Calcium: 9.5 mg/dL (ref 8.9–10.3)
Chloride: 105 mmol/L (ref 98–111)
Creatinine: 1.12 mg/dL (ref 0.61–1.24)
GFR, Estimated: >60 mL/min (ref >60)
Glucose, Bld: 169 mg/dL — ABNORMAL HIGH (ref 70–99)
Potassium: 4.1 mmol/L (ref 3.5–5.1)
Sodium: 137 mmol/L (ref 135–145)
Total Bilirubin: 0.5 mg/dL (ref <1.2)
Total Protein: 7 g/dL (ref 6.5–8.1)

## 2022-12-29 LAB — CBC WITH DIFFERENTIAL (CANCER CENTER ONLY)
Abs Immature Granulocytes: 0.02 10*3/uL (ref 0.00–0.07)
Basophils Absolute: 0 10*3/uL (ref 0.0–0.1)
Basophils Relative: 1 %
Eosinophils Absolute: 0.3 10*3/uL (ref 0.0–0.5)
Eosinophils Relative: 4 %
HCT: 37.6 % — ABNORMAL LOW (ref 39.0–52.0)
Hemoglobin: 12.9 g/dL — ABNORMAL LOW (ref 13.0–17.0)
Immature Granulocytes: 0 %
Lymphocytes Relative: 17 %
Lymphs Abs: 1.2 10*3/uL (ref 0.7–4.0)
MCH: 32 pg (ref 26.0–34.0)
MCHC: 34.3 g/dL (ref 30.0–36.0)
MCV: 93.3 fL (ref 80.0–100.0)
Monocytes Absolute: 0.4 10*3/uL (ref 0.1–1.0)
Monocytes Relative: 6 %
Neutro Abs: 5.1 10*3/uL (ref 1.7–7.7)
Neutrophils Relative %: 72 %
Platelet Count: 257 10*3/uL (ref 150–400)
RBC: 4.03 MIL/uL — ABNORMAL LOW (ref 4.22–5.81)
RDW: 13.2 % (ref 11.5–15.5)
WBC Count: 7 10*3/uL (ref 4.0–10.5)
nRBC: 0 % (ref 0.0–0.2)

## 2022-12-29 LAB — TSH: TSH: 2.951 u[IU]/mL (ref 0.350–4.500)

## 2022-12-29 MED ORDER — SODIUM CHLORIDE 0.9 % IV SOLN: INTRAVENOUS | Status: DC

## 2022-12-29 MED ORDER — SODIUM CHLORIDE 0.9 % IV SOLN
200.0000 mg | Freq: Once | INTRAVENOUS | Status: AC
  Administered 2022-12-29: 200 mg via INTRAVENOUS
  Filled 2022-12-29: qty 200

## 2022-12-29 NOTE — Patient Instructions (Signed)
 [facility] - A DEPT OF [facility]  Discharge Instructions: Thank you for choosing [facility] to provide your oncology and hematology care.   If you have a lab appointment with the Cancer Center, please go directly to the Cancer Center and check in at the registration area.   Wear comfortable clothing and clothing appropriate for easy access to any Portacath or PICC line.   We strive to give you quality time with your provider. You may need to reschedule your appointment if you arrive late (15 or more minutes).  Arriving late affects you and other patients whose appointments are after yours.  Also, if you miss three or more appointments without notifying the office, you may be dismissed from the clinic at the provider's discretion.      For prescription refill requests, have your pharmacy contact our office and allow 72 hours for refills to be completed.    Today you received the following chemotherapy and/or immunotherapy agents Rande Lawman      To help prevent nausea and vomiting after your treatment, we encourage you to take your nausea medication as directed.  BELOW ARE SYMPTOMS THAT SHOULD BE REPORTED IMMEDIATELY: *FEVER GREATER THAN 100.4 F (38 C) OR HIGHER *CHILLS OR SWEATING *NAUSEA AND VOMITING THAT IS NOT CONTROLLED WITH YOUR NAUSEA MEDICATION *UNUSUAL SHORTNESS OF BREATH *UNUSUAL BRUISING OR BLEEDING *URINARY PROBLEMS (pain or burning when urinating, or frequent urination) *BOWEL PROBLEMS (unusual diarrhea, constipation, pain near the anus) TENDERNESS IN MOUTH AND THROAT WITH OR WITHOUT PRESENCE OF ULCERS (sore throat, sores in mouth, or a toothache) UNUSUAL RASH, SWELLING OR PAIN  UNUSUAL VAGINAL DISCHARGE OR ITCHING   Items with * indicate a potential emergency and should be followed up as soon as possible or go to the Emergency Department if any problems should occur.  Please show the CHEMOTHERAPY ALERT CARD or IMMUNOTHERAPY  ALERT CARD at check-in to the Emergency Department and triage nurse.  Should you have questions after your visit or need to cancel or reschedule your appointment, please contact [facility] - A DEPT OF [facility]  Dept: [phone]  and follow the prompts.  Office hours are 8:00 a.m. to 4:30 p.m. Monday - Friday. Please note that voicemails left after 4:00 p.m. may not be returned until the following business day.  We are closed weekends and major holidays. You have access to a nurse at all times for urgent questions. Please call the main number to the clinic Dept: [phone] and follow the prompts.   For any non-urgent questions, you may also contact your provider using MyChart. We now offer e-Visits for anyone 42 and older to request care online for non-urgent symptoms. For details visit [email or web address].   Also download the MyChart app! Go to the app store, search "MyChart", open the app, select [place], and log in with your MyChart username and password.

## 2022-12-30 ENCOUNTER — Encounter (HOSPITAL_COMMUNITY): Payer: Self-pay

## 2022-12-30 ENCOUNTER — Other Ambulatory Visit: Payer: Self-pay | Admitting: Urology

## 2022-12-30 DIAGNOSIS — C3490 Malignant neoplasm of unspecified part of unspecified bronchus or lung: Secondary | ICD-10-CM | POA: Diagnosis not present

## 2022-12-30 LAB — T4: T4, Total: 6.6 ug/dL (ref 4.5–12.0)

## 2022-12-31 ENCOUNTER — Other Ambulatory Visit (INDEPENDENT_AMBULATORY_CARE_PROVIDER_SITE_OTHER): Payer: 59

## 2022-12-31 ENCOUNTER — Other Ambulatory Visit: Payer: Self-pay | Admitting: Urology

## 2022-12-31 DIAGNOSIS — E039 Hypothyroidism, unspecified: Secondary | ICD-10-CM

## 2022-12-31 LAB — T4, FREE: Free T4: 0.84 ng/dL (ref 0.60–1.60)

## 2022-12-31 LAB — TSH: TSH: 3.99 u[IU]/mL (ref 0.35–5.50)

## 2022-12-31 MED ORDER — SODIUM CHLORIDE 0.45 % IV SOLN: INTRAVENOUS | Status: AC

## 2023-01-01 LAB — FUNGUS CULTURE RESULT

## 2023-01-01 LAB — FUNGAL ORGANISM REFLEX

## 2023-01-01 LAB — FUNGUS CULTURE WITH STAIN

## 2023-01-01 NOTE — H&P (Signed)
Chief Complaint: Patient was seen in consultation today for Port-A-Cath placement to initiate chemotherapy, at the request of Dr. Ardelle Anton.  Supervising Physician: Richarda Overlie  Patient Status: [facility] - Out-pt  History of Present Illness: Tyler Hendrix is a 67 y.o. male   FULL Code status per patient.  Per Medical Oncology note on 11/12: This is a very pleasant 67 year old never smoker Caucasian male with stage IV lung cancer, poorly differentiated carcinoma, felt to be non-small cell lung cancer. He was diagnosed in October 2024.  He presented with metabolic central left upper lobe lung mass invading the mediastinum, small hypermetabolic right hilar lymph nodes, bilateral adrenal gland masses and splenic lesion consistent metastatic disease, hypermetabolic focus in the right gluteus maximus muscle.  His molecular studies show he is MSI high   Dr. Arbutus Ped had a lengthy discussion with the patient today about his current condition and  recommended immunotherapy given his MSI high with keytruda 200 mg IV every 3 weeks. He is expected to undergo treatment next week on 12/29/2022.  Per patient request, IR was requested for Port-A-Cath placement for immunotherapy administration. He has had one in the past.    Past Medical History:  Diagnosis Date   Abnormal TSH    Arthritis    ddd   Atrial fibrillation and flutter (HCC)    s/p DCCV 03/26/2018   CAD in native artery    a. 11/2015: s/p 2 vessel PCI of the first diagonal branch of the LAD in the mid left circumflex with DES. // s/p CABG 01/2018   Chronic combined systolic and diastolic CHF (congestive heart failure) (HCC)    sees Dr. Johney Frame    Complete heart block (HCC)    a. s/p Medtronic ppm 10/2015.   Dyspnea    with exertion   History of radiation therapy 2002   Hodgkin disease (HCC) 2002   a. s/p chest radiation and chemotherapy   Hypertension    Hypothyroidism    sees Dr. 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/27/2018   29 mm [facility] Mitral stented bovine pericardial tissue valve    Past Surgical History:  Procedure Laterality Date   A-FLUTTER ABLATION N/A 06/20/2021   Procedure: A-FLUTTER ABLATION;  Surgeon: Marinus Maw, MD;  Location: [facility] INVASIVE CV LAB;  Service: Cardiovascular;  Laterality: N/A;   BIV UPGRADE N/A 02/08/2019   Procedure: UPGRADE TO BIV PPM;  Surgeon: Hillis Range, MD;  Location: [facility] INVASIVE CV LAB;  Service: Cardiovascular;  Laterality: N/A;   BRONCHIAL BIOPSY  12/02/2022   Procedure: BRONCHIAL BIOPSIES;  Surgeon: Leslye Peer, MD;  Location: [facility] ENDOSCOPY;  Service: Pulmonary;;   BRONCHIAL BRUSHINGS  12/02/2022   Procedure: BRONCHIAL BRUSHINGS;  Surgeon: Leslye Peer, MD;  Location: [facility] ENDOSCOPY;  Service: Pulmonary;;   BRONCHIAL NEEDLE ASPIRATION BIOPSY  12/02/2022   Procedure: BRONCHIAL NEEDLE ASPIRATION BIOPSIES;  Surgeon: Leslye Peer, MD;  Location: [facility] ENDOSCOPY;  Service: Pulmonary;;   BRONCHIAL WASHINGS  12/02/2022   Procedure: BRONCHIAL WASHINGS;  Surgeon: Delton Coombes,  Les Pou, MD;  Location: [facility] ENDOSCOPY;  Service: Pulmonary;;   CARDIAC CATHETERIZATION N/A 11/02/2015   Procedure: Left Heart Cath and Coronary Angiography;  Surgeon: Tonny Bollman, MD;  Location: [facility] INVASIVE CV LAB;  Service: Cardiovascular;  Laterality: N/A;   CARDIAC CATHETERIZATION N/A 11/21/2015   Procedure: Coronary Stent Intervention;  Surgeon: Tonny Bollman, MD;  Location: [facility] INVASIVE CV LAB;  Service: Cardiovascular;  Laterality: N/A;   CARDIAC CATHETERIZATION  11/2017   CARDIOVERSION N/A  03/26/2018   Procedure: CARDIOVERSION;  Surgeon: Parke Poisson, MD;  Location: [facility] ENDOSCOPY;  Service: Cardiovascular;  Laterality: N/A;   COLONOSCOPY  01/16/2015   per Dr. Christella Hartigan, benign polyps, repeat in 10 yrs    CORONARY ARTERY BYPASS GRAFT N/A 01/27/2018   Procedure: CORONARY ARTERY BYPASS GRAFTING (CABG) x two, using left internal mammary artery and right leg greater saphenous vein harvested endoscopically;  Surgeon: Purcell Nails, MD;  Location: [facility] OR;  Service: Open Heart Surgery;  Laterality: N/A;   EP IMPLANTABLE DEVICE N/A 11/02/2015   MDT Adivisa MRI conditional dual chamber pacemaker implanted by Dr Johney Frame for complete heart block   FEMUR IM NAIL Left 07/01/2022   Procedure: INTRAMEDULLARY (IM) NAIL FEMORAL LEFT ANTEGRADE;  Surgeon: Myrene Galas, MD;  Location: [facility] OR;  Service: Orthopedics;  Laterality: Left;   KNEE SURGERY Right 1972   LEAD EXTRACTION N/A 10/20/2022   Procedure: LEAD EXTRACTION;  Surgeon: Marinus Maw, MD;  Location: [facility] INVASIVE CV LAB;  Service: Cardiovascular;  Laterality: N/A;   LUMBAR LAMINECTOMY  1996   MITRAL VALVE REPLACEMENT N/A 01/27/2018   Procedure: MITRAL VALVE (MV) REPLACEMENT using Magna Mitral Ease Valve Size ;  Surgeon: Purcell Nails, MD;  Location: [facility] OR;  Service: Open Heart Surgery;  Laterality: N/A;   RIGHT/LEFT HEART CATH AND CORONARY ANGIOGRAPHY N/A 12/02/2017   Procedure: RIGHT/LEFT HEART CATH AND CORONARY ANGIOGRAPHY;  Surgeon: Corky Crafts, MD;  Location: [facility] INVASIVE CV LAB;  Service: Cardiovascular;  Laterality: N/A;   TEE WITHOUT CARDIOVERSION N/A 12/09/2017   Procedure: TRANSESOPHAGEAL ECHOCARDIOGRAM (TEE);  Surgeon: Jodelle Red, MD;  Location: [facility] ENDOSCOPY;  Service: Cardiovascular;  Laterality: N/A;   TEE WITHOUT CARDIOVERSION N/A 01/27/2018   Procedure: TRANSESOPHAGEAL ECHOCARDIOGRAM (TEE);  Surgeon: Purcell Nails, MD;  Location: [facility] OR;  Service: Open Heart Surgery;  Laterality: N/A;   VIDEO  BRONCHOSCOPY WITH RADIAL ENDOBRONCHIAL ULTRASOUND  12/02/2022   Procedure: VIDEO BRONCHOSCOPY WITH RADIAL ENDOBRONCHIAL ULTRASOUND;  Surgeon: Leslye Peer, MD;  Location: [facility] ENDOSCOPY;  Service: Pulmonary;;    Allergies: Other  Medications: Prior to Admission medications   Medication Sig Start Date End Date Taking? Authorizing Provider  albuterol (VENTOLIN HFA) 108 (90 Base) MCG/ACT inhaler Inhale 1-2 puffs into the lungs every 6 (six) hours as needed for wheezing or shortness of breath. 12/09/22   Bevelyn Ngo, NP  aspirin EC 81 MG tablet Take 81 mg by mouth daily. 11/16/18   [provider]  atorvastatin (LIPITOR) 40 MG tablet Take 1 tablet (40 mg total) by mouth daily. 08/19/22   [name], MD  carvedilol (COREG) 6.25 MG tablet Take 1 tablet (6.25 mg total) by mouth 2 (two) times daily with a meal. 08/19/22   [name], MD  Cholecalciferol 125 MCG (5000 UT) TABS Take 1 tablet by mouth daily. 07/03/22   Montez Morita, PA-C  levothyroxine (SYNTHROID) 75 MCG tablet Take 1 tablet (75 mcg total) by mouth daily before breakfast. 11/03/22   [name], MD  lidocaine-prilocaine (EMLA) cream Apply 1 Application topically as needed. 12/23/22   Heilingoetter, Cassandra L, PA-C  meloxicam (MOBIC) 15 MG tablet Take 1 tablet (15 mg total) by mouth every day as needed 12/01/22     Multiple Vitamin (MULTIVITAMIN WITH MINERALS) TABS tablet Take 1 tablet by mouth daily.    [provider]  ondansetron (ZOFRAN) 8 MG tablet Take 1 tablet (8 mg total) by mouth every 8 (eight) hours as needed for nausea or vomiting. 12/23/22   Heilingoetter, Cassandra L, PA-C  sacubitril-valsartan (ENTRESTO) 24-26 MG Take 1 tablet by mouth 2 (two) times daily. 02/07/22 02/07/23  Chrystie Nose, MD  sildenafil (VIAGRA) 50 MG tablet Take 1 tablet (50 mg total) by mouth daily as needed for erectile dysfunction. 05/24/20   [name], MD  spironolactone (ALDACTONE) 25 MG tablet Take one-half tablet  (12.5 mg total) by mouth daily. 07/21/22 07/21/23  [name], MD  tamsulosin (FLOMAX) 0.4 MG CAPS capsule Take 1 capsule (0.4 mg total) by mouth daily as needed. 12/01/22     traZODone (DESYREL) 50 MG tablet Take 1 tablet (50 mg total) by mouth at bedtime as needed for sleep. Patient taking differently: Take 50 mg by mouth at bedtime as needed for sleep. 1.5 or 2 po q night 12/02/22   Byrum, Les Pou, MD     Family History  Problem Relation Age of Onset   Cancer Other        prostate father hx   Heart disease Mother    Heart disease Father    Colon cancer Neg Hx     Social History   Socioeconomic History   Marital status: Married    Spouse name: Not on file   Number of children: 3   Years of education: Masters   Highest education level: Not on file  Occupational History   Occupation: lawyer    Comment: [employer]  Tobacco Use   Smoking status: Never   Smokeless tobacco: Never  Vaping Use   Vaping status: Never Used  Substance and Sexual Activity   Alcohol use: Yes    Alcohol/week: 3.0 standard drinks of alcohol    Types: 3 Glasses of wine per week   Drug use: No   Sexual activity: Not on file  Other Topics Concern   Not on file  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[place]   Social Determinants of Health   Financial Resource Strain: Not on file  Food Insecurity: No Food Insecurity (10/20/2022)   Hunger Vital Sign    Worried About Running Out of Food in the Last Year: Never true    Ran Out of Food in the Last Year: Never true  Transportation Needs: No Transportation Needs (10/20/2022)   PRAPARE - Administrator, Civil Service (Medical): No    Lack of Transportation (Non-Medical): No  Physical Activity: Not on file  Stress: Not on file  Social Connections: Not on file    Review of Systems: A 12 point ROS discussed and pertinent positives are indicated in the HPI above.  All other systems are negative.  Review of  Systems  Constitutional:  Negative for activity change, appetite change, fatigue and fever.  Respiratory:  Negative for cough, chest tightness, shortness of breath and wheezing.   Cardiovascular:  Negative for chest pain.  Gastrointestinal:  Negative for abdominal distention.  Skin:  Negative for color change.  Psychiatric/Behavioral:  Negative for behavioral  problems and confusion.     Vital Signs: There were no vitals taken for this visit.  Advance Care Plan: The advanced care plan/surrogate decision maker was discussed at the time of visit and documented in the medical record.    Physical Exam Constitutional:      General: He is not in acute distress.    Appearance: Normal appearance.  HENT:     Mouth/Throat:     Mouth: Mucous membranes are moist.  Cardiovascular:     Rate and Rhythm: Normal rate and regular rhythm.     Pulses: Normal pulses.     Heart sounds: No murmur heard. Pulmonary:     Effort: Pulmonary effort is normal. No respiratory distress.     Breath sounds: Normal breath sounds. No wheezing.  Abdominal:     Palpations: Abdomen is soft.  Musculoskeletal:        General: Normal range of motion.  Skin:    General: Skin is warm and dry.  Neurological:     Mental Status: He is alert and oriented to person, place, and time.  Psychiatric:        Mood and Affect: Mood normal.        Behavior: Behavior normal.        Thought Content: Thought content normal.        Judgment: Judgment normal.     Imaging: CT HEAD W & WO CONTRAST ( )  Result Date: 12/23/2022 CLINICAL DATA:  Metastatic disease evaluation. EXAM: CT HEAD WITHOUT AND WITH CONTRAST TECHNIQUE: Contiguous axial images were obtained from the base of the skull through the vertex without and with intravenous contrast. RADIATION DOSE REDUCTION: This exam was performed according to the departmental dose-optimization program which includes automated exposure control, adjustment of the mA and/or kV according  to patient size and/or use of iterative reconstruction technique. CONTRAST:  75mL OMNIPAQUE IOHEXOL 300 MG/ML  SOLN COMPARISON:  None Available. FINDINGS: Brain: No evidence of acute infarction, hemorrhage, hydrocephalus, or mass lesion/mass effect. No abnormal enhancement. Benign and mild enlargement of the supra vermian cistern. Vascular: No hyperdense vessel or unexpected calcification. Visible vessels are patent. Skull: Normal. Negative for fracture or focal lesion. Sinuses/Orbits: No acute finding. IMPRESSION: No evidence of metastatic disease. Electronically Signed   By: Tiburcio Pea M.D.   On: 12/23/2022 09:16   CT US GUIDED BIOPSY  Result Date: 12/16/2022 INDICATION: 67 year old male presents for biopsy of FDG avid left adrenal gland mass EXAM: LEFT ADRENAL GLAND MASS BIOPSY, CT-GUIDED MEDICATIONS: None. ANESTHESIA/SEDATION: Moderate (conscious) sedation was employed during this procedure. A total of Versed 1.0 mg and Fentanyl 75 mcg was administered intravenously by the radiology nurse. Total intra-service moderate Sedation Time: 12 minutes. The patient's level of consciousness and vital signs were monitored continuously by radiology nursing throughout the procedure under my direct supervision. COMPLICATIONS: None PROCEDURE: Informed written consent was obtained from the patient after a thorough discussion of the procedural risks, benefits and alternatives. All questions were addressed. Maximal Sterile Barrier Technique was utilized including caps, mask, sterile gowns, sterile gloves, sterile drape, hand hygiene and skin antiseptic. A timeout was performed prior to the initiation of the procedure. Patient was positioned left decubitus on the CT gantry table. Scout CT acquired for planning purposes. The patient was prepped and draped in the usual sterile fashion. 1% lidocaine was used for local anesthesia. Using CT guidance, 17 gauge guide needle was advanced into the edge of the left adrenal mass.  Once we confirmed needle tip position multiple  18 gauge core biopsy were acquired. Needle was removed a after a single Gel-Foam pledget, nd a final CT was acquired. Patient tolerated the procedure well and remained hemodynamically stable throughout. No complications were encountered and no significant blood loss. IMPRESSION: Status post CT-guided biopsy of left adrenal mass. Signed, Yvone Neu. Miachel Roux, RPVI [facility] Electronically Signed   By: Gilmer Mor D.O.   On: 12/16/2022 10:10   NM PET Image Initial (PI) Skull Base To Thigh  Result Date: 12/08/2022 CLINICAL DATA:  Initial treatment strategy for left upper lobe lung mass. Non-small cell lung cancer. EXAM: NUCLEAR MEDICINE PET SKULL BASE TO THIGH TECHNIQUE: 9.9 mCi F-18 FDG was injected intravenously. Full-ring PET imaging was performed from the skull base to thigh after the radiotracer. CT data was obtained and used for attenuation correction and anatomic localization. Fasting blood glucose: 82 mg/dl COMPARISON:  Chest CT 16/11/9602 FINDINGS: Mediastinal blood pool activity: SUV max 2.32 Liver activity: SUV max NA NECK: No hypermetabolic lymph nodes in the neck. Incidental CT findings: Bilateral carotid artery calcifications. CHEST: The central left upper lobe lung mass invading the mediastinum demonstrates hypermetabolism with SUV max of 19.93 and consistent with known neoplasm. Small right hilar lymph node has an SUV max of 5.24 and likely represents metastatic disease. No hypermetabolic supraclavicular or axillary nodes. No pulmonary nodules to suggest pulmonary metastatic disease. Incidental CT findings: Stable marked cardiac enlargement. Stable pacer wires and vascular calcifications. ABDOMEN/PELVIS: Bilateral adrenal gland masses are hypermetabolic and consistent with metastatic disease. The right adrenal gland lesion has an SUV max of 15.22 and the left 20.35. Hypermetabolic  splenic lesions, difficult to identify on the CT scan. The upper pole lesion has an SUV max of 13.58 and the lateral and more inferior lesion has an SUV max of 20.35. No hepatic metastatic lesions. The pancreas and kidneys are. No hypermetabolic abdominal pelvic lymph nodes. Incidental CT findings: Cholelithiasis noted. Scattered aortic and iliac artery calcifications but no aneurysm. Mild prostate gland enlargement. Small bilateral inguinal hernias containing fat. SKELETON: Hypermetabolic focus in the right gluteus maximus muscle has an SUV max of 10.56 and is likely a muscle metastasis. No hypermetabolic bone lesions to suggest osseous metastatic disease. Incidental CT findings: None. IMPRESSION: 1. Hypermetabolic central left upper lobe lung mass invading the mediastinum consistent with known neoplasm. 2. Small hypermetabolic right hilar lymph node likely represents metastatic disease. 3. Bilateral adrenal gland masses and splenic lesions consistent with metastatic disease. 4. Hypermetabolic focus in the right gluteus maximus muscle is likely a muscle metastasis. 5. No findings for osseous metastatic disease. Aortic Atherosclerosis (ICD10-I70.0). Electronically Signed   By: Rudie Meyer M.D.   On: 12/08/2022 12:12    Labs:  CBC: Recent Labs    12/10/22 1302 12/16/22 0659 12/23/22 0814 12/29/22 1433  WBC 7.1 7.4 7.2 7.0  HGB 12.2* 11.9* 13.1 12.9*  HCT 36.3* 36.0* 39.6 37.6*  PLT 287 252 272 257    COAGS: Recent Labs    06/30/22 1845 12/16/22 0659  INR 1.1 1.0    BMP: Recent Labs    10/14/22 0959 11/03/22 0902 12/10/22 1302 12/23/22 0814 12/29/22 1433  NA 138 138 140 139 137  K 4.5 4.6 5.0 4.8 4.1  CL 102 102 106 105 105  CO2 26 28 28 29 26   GLUCOSE 111* 119* 104* 99 169*  BUN 13 19 18 19 21   CALCIUM 9.4 9.3 9.7 9.8 9.5  CREATININE 1.13 1.12 1.22 1.17 1.12  GFRNONAA >60  --  >60 >60 >60    LIVER FUNCTION TESTS: Recent Labs    08/19/22 0839 12/10/22 1302  12/23/22 0814 12/29/22 1433  BILITOT 0.6 0.6 0.6 0.5  AST 16 17 18 18   ALT 13 16 15 15   ALKPHOS 142* 76 79 67  PROT 7.1 7.0 7.2 7.0  ALBUMIN 4.6 4.4 4.6 4.3    TUMOR MARKERS: No results for input(s): "AFPTM", "CEA", "CA199", "CHROMGRNA" in the last 8760 hours.  Assessment and Plan:  Pt with stage IV lung cancer, poorly differentiated carcinoma, felt to be non-small cell lung cancer. He was diagnosed in October 2024. His molecular studies show he is MSI high   Dr. Arbutus Ped recommended immunotherapy given his MSI high with keytruda 200 mg IV every 3 weeks. He is expected to undergo treatment next week on 12/29/2022.  Per patient request, IR was requested for Port-A-Cath placement for immunotherapy administration. He has had one in the past.  Risks and benefits of image guided port-a-catheter placement was discussed with the patient including, but not limited to bleeding, infection, pneumothorax, or fibrin sheath development and need for additional procedures.  All of the patient's questions were answered, patient is agreeable to proceed. Consent signed and in chart.   Thank you for this interesting consult.  I greatly enjoyed meeting Tyler Hendrix and look forward to participating in their care.  A copy of this report was sent to the requesting provider on this date.  Electronically Signed: Sable Feil, PA-C 01/01/2023, 1:36 PM   I spent a total of  30 Minutes   in face to face in clinical consultation, greater than 50% of which was counseling/coordinating care for Port-A-Cath placement.

## 2023-01-02 ENCOUNTER — Other Ambulatory Visit: Payer: Self-pay

## 2023-01-02 ENCOUNTER — Encounter (HOSPITAL_COMMUNITY): Payer: Self-pay

## 2023-01-02 ENCOUNTER — Ambulatory Visit (HOSPITAL_COMMUNITY)
Admission: RE | Admit: 2023-01-02 | Discharge: 2023-01-02 | Disposition: A | Discharge status: Home / Self Care | Payer: 59 | Source: Ambulatory Visit | Attending: Physician Assistant | Admitting: Physician Assistant

## 2023-01-02 DIAGNOSIS — C7971 Secondary malignant neoplasm of right adrenal gland: Secondary | ICD-10-CM | POA: Insufficient documentation

## 2023-01-02 DIAGNOSIS — C799 Secondary malignant neoplasm of unspecified site: Secondary | ICD-10-CM

## 2023-01-02 DIAGNOSIS — C3490 Malignant neoplasm of unspecified part of unspecified bronchus or lung: Secondary | ICD-10-CM | POA: Diagnosis not present

## 2023-01-02 HISTORY — PX: IR IMAGING GUIDED PORT INSERTION: IMG5740

## 2023-01-02 MED ORDER — LIDOCAINE HCL 1 % IJ SOLN
20.0000 mL | Freq: Once | INTRAMUSCULAR | Status: AC
  Administered 2023-01-02: 10 mL via INTRADERMAL

## 2023-01-02 MED ORDER — SODIUM CHLORIDE 0.9% FLUSH
10.0000 mL | Freq: Two times a day (BID) | INTRAVENOUS | Status: DC
Start: 2023-01-02 — End: 2023-01-03

## 2023-01-02 MED ORDER — HEPARIN SOD (PORK) LOCK FLUSH 100 UNIT/ML IV SOLN
INTRAVENOUS | Status: AC
  Filled 2023-01-02: qty 5

## 2023-01-02 MED ORDER — FENTANYL CITRATE (PF) 100 MCG/2ML IJ SOLN
INTRAMUSCULAR | Status: AC
  Filled 2023-01-02: qty 2

## 2023-01-02 MED ORDER — HEPARIN SOD (PORK) LOCK FLUSH 100 UNIT/ML IV SOLN
500.0000 [IU] | Freq: Once | INTRAVENOUS | Status: AC
  Administered 2023-01-02: 500 [IU] via INTRAVENOUS

## 2023-01-02 MED ORDER — MIDAZOLAM HCL 2 MG/2ML IJ SOLN
INTRAMUSCULAR | Status: AC | PRN
  Administered 2023-01-02: 1 mg via INTRAVENOUS
  Administered 2023-01-02 (×2): .5 mg via INTRAVENOUS

## 2023-01-02 MED ORDER — FENTANYL CITRATE (PF) 100 MCG/2ML IJ SOLN
INTRAMUSCULAR | Status: AC | PRN
Start: 2023-01-02 — End: 2023-01-02
  Administered 2023-01-02 (×2): 25 ug via INTRAVENOUS
  Administered 2023-01-02: 50 ug via INTRAVENOUS

## 2023-01-02 MED ORDER — LIDOCAINE-EPINEPHRINE 1 %-1:100000 IJ SOLN
INTRAMUSCULAR | Status: AC
  Filled 2023-01-02: qty 1

## 2023-01-02 MED ORDER — SODIUM CHLORIDE 0.9 % IV SOLN: INTRAVENOUS | Status: DC

## 2023-01-02 MED ORDER — LIDOCAINE-EPINEPHRINE 1 %-1:100000 IJ SOLN
20.0000 mL | Freq: Once | INTRAMUSCULAR | Status: AC
  Administered 2023-01-02: 20 mL via INTRADERMAL

## 2023-01-02 MED ORDER — LIDOCAINE HCL 1 % IJ SOLN
INTRAMUSCULAR | Status: AC
  Filled 2023-01-02: qty 20

## 2023-01-02 MED ORDER — MIDAZOLAM HCL 2 MG/2ML IJ SOLN
INTRAMUSCULAR | Status: AC
Start: 2023-01-02 — End: ?
  Filled 2023-01-02: qty 2

## 2023-01-02 NOTE — Discharge Instructions (Signed)

## 2023-01-02 NOTE — Procedures (Signed)
Interventional Radiology Procedure:   Indications: Metastatic lung cancer  Procedure: Port placement  Findings: Right jugular port, tip at SVC/RA junction  Complications: None     EBL: Minimal, less than 10 ml  Plan: Discharge in one hour.  Keep port site and incisions dry for at least 24 hours.     Finnleigh Marchetti R. Lowella Dandy, MD  Pager: [phone]

## 2023-01-02 NOTE — Progress Notes (Signed)
1040 20 G. Right wrist placed 01-02-23 , removed at 1430, 01-02-23.

## 2023-01-05 ENCOUNTER — Ambulatory Visit: Payer: 59

## 2023-01-05 ENCOUNTER — Other Ambulatory Visit: Payer: 59

## 2023-01-05 ENCOUNTER — Encounter (HOSPITAL_COMMUNITY): Payer: Self-pay

## 2023-01-12 ENCOUNTER — Encounter (HOSPITAL_COMMUNITY): Payer: Self-pay

## 2023-01-14 ENCOUNTER — Institutional Professional Consult (permissible substitution): Payer: 59 | Admitting: Emergency Medicine

## 2023-01-15 NOTE — Progress Notes (Signed)
[facility] OFFICE PROGRESS NOTE  Tyler Salisbury, MD [address] 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 He was diagnosed in October 2024.  He presented with metabolic central left upper lobe lung mass invading the mediastinum, small hypermetabolic right hilar lymph nodes, bilateral adrenal gland masses and splenic lesion consistent metastatic disease, hypermetabolic focus in the right gluteus maximus muscle.    Molecular studies by [employer] show MSI high.    Foundation One requested on 12/22/22  PRIOR THERAPY: None  CURRENT THERAPY:  Immunotherapy with Keytruda 200 mg IV every 3 weeks.  First dose expected on 12/29/22. Status post 1 cycle.   INTERVAL HISTORY: Tyler Hendrix 67 y.o. male returns to the clinic today for a follow-up visit accompanied by his wife.  The patient is a never smoker was recently found to have metastatic cancer, felt to be poorly differentiated non-small cell lung cancer. He was found to have MSI high. Therefore, he started on treatment with immunotherapy with Keytruda. He underwent his first cycle of treatment and tolerated it well. He had some mild fatigue following treatment. He also had 45 minutes of itching over his pacemaker which subsided.   In the interval since last being seen, he had a port-a-cath placed which he tolerated it fine on 01/02/23.   Denies any fever, chills, or night sweats.  His weight is stable.  At his last appointment, he was reporting taking a deep breath would trigger a coughing spell. Today, he states his breathing is "fine". He states he is not coughing that much "at all" except he may have a mild cough after eating. He denies any chest pain.  He denies any hemoptysis.  He denies any nausea, vomiting, or constipation.  He had one episode of diarrhea this morning, otherwise, he denies other diarrhea. Denies any headaches. He has mild anemia on labs. He  takes a multivitamin. He wonders if this needs to be managed. He is here for evaluation and repeat blood work before undergoing cycle #2.    MEDICAL HISTORY: Past Medical History:  Diagnosis Date   Abnormal TSH    Arthritis    ddd   Atrial fibrillation and flutter (HCC)    s/p DCCV 03/26/2018   CAD in native artery    a. 11/2015: s/p 2 vessel PCI of the first diagonal branch of the LAD in the mid left circumflex with DES. // s/p CABG 01/2018   Chronic combined systolic and diastolic CHF (congestive heart failure) (HCC)    sees Dr. Johney Frame    Complete heart block (HCC)    a. s/p Medtronic ppm 10/2015.   Dyspnea    with exertion   History of radiation therapy 2002   Hodgkin disease (HCC) 2002   a. s/p chest radiation and chemotherapy   Hypertension    Hypothyroidism    sees Dr. Elvera Lennox    Incidental pulmonary nodule 01/19/2018   Left apical opacity - possible scarring   Inguinal hernia 10/14/2022   bilateral   Mitral regurgitation    s/p bioprosthetic MV replacement 01/2018   Pacemaker 10/2015   Medtronic   PVC's (premature ventricular contractions)    Right bundle branch block    S/P CABG x 2 01/27/2018   LIMA to LAD, SVG to RCA, EVH via right thigh   S/P mitral valve replacement with bioprosthetic valve 01/27/2018   29 mm [facility] Mitral stented bovine pericardial tissue valve  ALLERGIES:  is allergic to other.  MEDICATIONS:  Current Outpatient Medications  Medication Sig Dispense Refill   albuterol (VENTOLIN HFA) 108 (90 Base) MCG/ACT inhaler Inhale 1-2 puffs into the lungs every 6 (six) hours as needed for wheezing or shortness of breath. 8 g 2   aspirin EC 81 MG tablet Take 81 mg by mouth daily.     atorvastatin (LIPITOR) 40 MG tablet Take 1 tablet (40 mg total) by mouth daily. 90 tablet 3   carvedilol (COREG) 6.25 MG tablet Take 1 tablet (6.25 mg total) by mouth 2 (two) times daily with a meal. 180 tablet 3   Cholecalciferol 125 MCG (5000 UT) TABS Take 1  tablet by mouth daily. 30 tablet 6   levothyroxine (SYNTHROID) 75 MCG tablet Take 1 tablet (75 mcg total) by mouth daily before breakfast. 90 tablet 3   lidocaine-prilocaine (EMLA) cream Apply 1 Application topically as needed. 30 g 2   meloxicam (MOBIC) 15 MG tablet Take 1 tablet (15 mg total) by mouth every day as needed 30 tablet 1   Multiple Vitamin (MULTIVITAMIN WITH MINERALS) TABS tablet Take 1 tablet by mouth daily.     ondansetron (ZOFRAN) 8 MG tablet Take 1 tablet (8 mg total) by mouth every 8 (eight) hours as needed for nausea or vomiting. 30 tablet 2   sacubitril-valsartan (ENTRESTO) 24-26 MG Take 1 tablet by mouth 2 (two) times daily. 180 tablet 3   sildenafil (VIAGRA) 50 MG tablet Take 1 tablet (50 mg total) by mouth daily as needed for erectile dysfunction. 30 tablet 3   spironolactone (ALDACTONE) 25 MG tablet Take one-half tablet (12.5 mg total) by mouth daily. 45 tablet 3   tamsulosin (FLOMAX) 0.4 MG CAPS capsule Take 1 capsule (0.4 mg total) by mouth daily as needed. 30 capsule 1   traZODone (DESYREL) 50 MG tablet Take 1 tablet (50 mg total) by mouth at bedtime as needed for sleep. (Patient taking differently: Take 50 mg by mouth at bedtime as needed for sleep. 1.5 or 2 po q night)     No current facility-administered medications for this visit.    SURGICAL HISTORY:  Past Surgical History:  Procedure Laterality Date   A-FLUTTER ABLATION N/A 06/20/2021   Procedure: A-FLUTTER ABLATION;  Surgeon: Marinus Maw, MD;  Location: [facility] INVASIVE CV LAB;  Service: Cardiovascular;  Laterality: N/A;   BIV UPGRADE N/A 02/08/2019   Procedure: UPGRADE TO BIV PPM;  Surgeon: Hillis Range, MD;  Location: [facility] INVASIVE CV LAB;  Service: Cardiovascular;  Laterality: N/A;   BRONCHIAL BIOPSY  12/02/2022   Procedure: BRONCHIAL BIOPSIES;  Surgeon: Leslye Peer, MD;  Location: [facility] ENDOSCOPY;  Service: Pulmonary;;   BRONCHIAL BRUSHINGS  12/02/2022   Procedure: BRONCHIAL BRUSHINGS;  Surgeon: Leslye Peer, MD;  Location: [facility] ENDOSCOPY;  Service: Pulmonary;;   BRONCHIAL NEEDLE ASPIRATION BIOPSY  12/02/2022   Procedure: BRONCHIAL NEEDLE ASPIRATION BIOPSIES;  Surgeon: Leslye Peer, MD;  Location: [facility] ENDOSCOPY;  Service: Pulmonary;;   BRONCHIAL WASHINGS  12/02/2022   Procedure: BRONCHIAL WASHINGS;  Surgeon: Leslye Peer, MD;  Location: [facility] ENDOSCOPY;  Service: Pulmonary;;   CARDIAC CATHETERIZATION N/A 11/02/2015   Procedure: Left Heart Cath and Coronary Angiography;  Surgeon: Tonny Bollman, MD;  Location: [facility] INVASIVE CV LAB;  Service: Cardiovascular;  Laterality: N/A;   CARDIAC CATHETERIZATION N/A 11/21/2015   Procedure: Coronary Stent Intervention;  Surgeon: Tonny Bollman, MD;  Location: [facility] INVASIVE CV LAB;  Service: Cardiovascular;  Laterality: N/A;   CARDIAC CATHETERIZATION  11/2017   CARDIOVERSION N/A 03/26/2018   Procedure: CARDIOVERSION;  Surgeon: Parke Poisson, MD;  Location: [facility] ENDOSCOPY;  Service: Cardiovascular;  Laterality: N/A;   COLONOSCOPY  01/16/2015   per Dr. Christella Hartigan, benign polyps, repeat in 10 yrs    CORONARY ARTERY BYPASS GRAFT N/A 01/27/2018   Procedure: CORONARY ARTERY BYPASS GRAFTING (CABG) x two, using left internal mammary artery and right leg greater saphenous vein harvested endoscopically;  Surgeon: Purcell Nails, MD;  Location: [facility] OR;  Service: Open Heart Surgery;  Laterality: N/A;   EP IMPLANTABLE DEVICE N/A 11/02/2015   MDT Adivisa MRI conditional dual chamber pacemaker implanted by Dr Johney Frame for complete heart block   FEMUR IM NAIL Left 07/01/2022   Procedure: INTRAMEDULLARY (IM) NAIL FEMORAL LEFT ANTEGRADE;  Surgeon: Myrene Galas, MD;  Location: [facility] OR;  Service: Orthopedics;  Laterality: Left;   IR IMAGING GUIDED PORT INSERTION  01/02/2023   KNEE SURGERY Right 1972   LEAD EXTRACTION N/A 10/20/2022   Procedure: LEAD EXTRACTION;  Surgeon: Marinus Maw, MD;  Location: [facility] INVASIVE CV LAB;  Service: Cardiovascular;  Laterality: N/A;   LUMBAR  LAMINECTOMY  1996   MITRAL VALVE REPLACEMENT N/A 01/27/2018   Procedure: MITRAL VALVE (MV) REPLACEMENT using Magna Mitral Ease Valve Size ;  Surgeon: Purcell Nails, MD;  Location: [facility] OR;  Service: Open Heart Surgery;  Laterality: N/A;   RIGHT/LEFT HEART CATH AND CORONARY ANGIOGRAPHY N/A 12/02/2017   Procedure: RIGHT/LEFT HEART CATH AND CORONARY ANGIOGRAPHY;  Surgeon: Corky Crafts, MD;  Location: [facility] INVASIVE CV LAB;  Service: Cardiovascular;  Laterality: N/A;   TEE WITHOUT CARDIOVERSION N/A 12/09/2017   Procedure: TRANSESOPHAGEAL ECHOCARDIOGRAM (TEE);  Surgeon: Jodelle Red, MD;  Location: [facility] ENDOSCOPY;  Service: Cardiovascular;  Laterality: N/A;   TEE WITHOUT CARDIOVERSION N/A 01/27/2018   Procedure: TRANSESOPHAGEAL ECHOCARDIOGRAM (TEE);  Surgeon: Purcell Nails, MD;  Location: [facility] OR;  Service: Open Heart Surgery;  Laterality: N/A;   VIDEO BRONCHOSCOPY WITH RADIAL ENDOBRONCHIAL ULTRASOUND  12/02/2022   Procedure: VIDEO BRONCHOSCOPY WITH RADIAL ENDOBRONCHIAL ULTRASOUND;  Surgeon: Leslye Peer, MD;  Location: [facility] ENDOSCOPY;  Service: Pulmonary;;    REVIEW OF SYSTEMS:   Review of Systems  Constitutional: Positive for fatigue the day following treatment. Negative for appetite change, chills, fever and unexpected weight change.  HENT: Negative for mouth sores, nosebleeds, sore throat and trouble swallowing.   Eyes: Positive for some increased difficulty reading the paper. Negative for eye problems and icterus.  Respiratory: Negative for significant cough (improved from prior), hemoptysis, shortness of breath and wheezing.   Cardiovascular: Negative for chest pain and leg swelling.  Gastrointestinal: Negative for abdominal pain, constipation, diarrhea (except one episode), nausea and vomiting.  Genitourinary: Negative for bladder incontinence, difficulty urinating, dysuria, frequency and hematuria.   Musculoskeletal: Negative for back pain, gait problem, neck pain and  neck stiffness.  Skin: Negative for itching and rash.  Neurological: Negative for dizziness, extremity weakness, gait problem, headaches, light-headedness and seizures.  Hematological: Negative for adenopathy. Does not bruise/bleed easily.  Psychiatric/Behavioral: Negative for confusion, depression and sleep disturbance. The patient is not nervous/anxious.     PHYSICAL EXAMINATION:  There were no vitals taken for this visit.  ECOG PERFORMANCE STATUS: 1  Physical Exam  Constitutional: Oriented to person, place, and time and well-developed, well-nourished, and in no distress.  HENT:  Head: Normocephalic and atraumatic.  Mouth/Throat: Oropharynx is clear and moist. No oropharyngeal exudate.  Eyes: Conjunctivae are normal. Right eye exhibits no discharge. Left eye  exhibits no discharge. No scleral icterus.  Neck: Normal range of motion. Neck supple.  Cardiovascular: Normal rate, regular rhythm, normal heart sounds and intact distal pulses.   Pulmonary/Chest: Effort normal and breath sounds normal. No respiratory distress. No wheezes. No rales.  Abdominal: Soft. Bowel sounds are normal. Exhibits no distension and no mass. There is no tenderness.  Musculoskeletal: Normal range of motion. Exhibits no edema.  Lymphadenopathy:    No cervical adenopathy.  Neurological: Alert and oriented to person, place, and time. Exhibits normal muscle tone. Gait normal. Coordination normal.  Skin: Skin is warm and dry. No rash noted. Not diaphoretic. No erythema. No pallor.  Psychiatric: Mood, memory and judgment normal.  Vitals reviewed.  LABORATORY DATA: Lab Results  Component Value Date   WBC 7.0 12/29/2022   HGB 12.9 (L) 12/29/2022   HCT 37.6 (L) 12/29/2022   MCV 93.3 12/29/2022   PLT 257 12/29/2022      Chemistry      Component Value Date/Time   NA 137 12/29/2022 1433   NA 137 06/06/2021 0919   K 4.1 12/29/2022 1433   CL 105 12/29/2022 1433   CO2 26 12/29/2022 1433   BUN 21 12/29/2022  1433   BUN 19 06/06/2021 0919   CREATININE 1.12 12/29/2022 1433   CREATININE 1.04 01/01/2016 0801      Component Value Date/Time   CALCIUM 9.5 12/29/2022 1433   ALKPHOS 67 12/29/2022 1433   AST 18 12/29/2022 1433   ALT 15 12/29/2022 1433   BILITOT 0.5 12/29/2022 1433       RADIOGRAPHIC STUDIES:  IR IMAGING GUIDED PORT INSERTION  Result Date: 01/02/2023 INDICATION: Metastatic lung cancer and Port-A-Cath needed for treatment. EXAM: FLUOROSCOPIC AND ULTRASOUND GUIDED PLACEMENT OF A SUBCUTANEOUS PORT COMPARISON:  None Available. MEDICATIONS: Moderate sedation ANESTHESIA/SEDATION: Moderate (conscious) sedation was employed during this procedure. A total of Versed 2 mg and fentanyl 100 mcg was administered intravenously at the order of the provider performing the procedure. Total intra-service moderate sedation time: 23 minutes. Patient's level of consciousness and vital signs were monitored continuously by radiology nurse throughout the procedure under the supervision of the provider performing the procedure. FLUOROSCOPY TIME:  Radiation Exposure Index (as provided by the fluoroscopic device): 1 mGy Kerma COMPLICATIONS: None immediate. PROCEDURE: The procedure, risks, benefits, and alternatives were explained to the patient. Questions regarding the procedure were encouraged and answered. The patient understands and consents to the procedure. Patient was placed supine on the interventional table. Ultrasound confirmed a patent right internal jugular vein. Ultrasound image was saved for documentation. The right chest and neck were cleaned with a skin antiseptic and a sterile drape was placed. Maximal barrier sterile technique was utilized including caps, mask, sterile gowns, sterile gloves, sterile drape, hand hygiene and skin antiseptic. The right neck was anesthetized with 1% lidocaine. Small incision was made in the right neck with a blade. Micropuncture set was placed in the right internal jugular  vein with ultrasound guidance. The micropuncture wire was used for measurement purposes. The right chest was anesthetized with 1% lidocaine with epinephrine. #15 blade was used to make an incision and a subcutaneous port pocket was formed. 8 french Power Port was assembled. Subcutaneous tunnel was formed with a stiff tunneling device. The port catheter was brought through the subcutaneous tunnel. The port was placed in the subcutaneous pocket. The micropuncture set was exchanged for a peel-away sheath. The catheter was placed through the peel-away sheath and the tip was positioned at the superior cavoatrial  junction. Catheter placement was confirmed with fluoroscopy. The port was accessed and flushed with heparinized saline. The port pocket was closed using two layers of absorbable sutures and Dermabond. The vein skin site was closed using a single layer of absorbable suture and Dermabond. Sterile dressings were applied. Patient tolerated the procedure well without an immediate complication. Ultrasound and fluoroscopic images were taken and saved for this procedure. IMPRESSION: Placement of a subcutaneous power-injectable port device. Catheter tip at the superior cavoatrial junction. Electronically Signed   By: Richarda Overlie M.D.   On: 01/02/2023 13:59   CT US GUIDED BIOPSY  Result Date: 12/16/2022 INDICATION: 67 year old male presents for biopsy of FDG avid left adrenal gland mass EXAM: LEFT ADRENAL GLAND MASS BIOPSY, CT-GUIDED MEDICATIONS: None. ANESTHESIA/SEDATION: Moderate (conscious) sedation was employed during this procedure. A total of Versed 1.0 mg and Fentanyl 75 mcg was administered intravenously by the radiology nurse. Total intra-service moderate Sedation Time: 12 minutes. The patient's level of consciousness and vital signs were monitored continuously by radiology nursing throughout the procedure under my direct supervision. COMPLICATIONS: None PROCEDURE: Informed written consent was obtained from the  patient after a thorough discussion of the procedural risks, benefits and alternatives. All questions were addressed. Maximal Sterile Barrier Technique was utilized including caps, mask, sterile gowns, sterile gloves, sterile drape, hand hygiene and skin antiseptic. A timeout was performed prior to the initiation of the procedure. Patient was positioned left decubitus on the CT gantry table. Scout CT acquired for planning purposes. The patient was prepped and draped in the usual sterile fashion. 1% lidocaine was used for local anesthesia. Using CT guidance, 17 gauge guide needle was advanced into the edge of the left adrenal mass. Once we confirmed needle tip position multiple 18 gauge core biopsy were acquired. Needle was removed a after a single Gel-Foam pledget, nd a final CT was acquired. Patient tolerated the procedure well and remained hemodynamically stable throughout. No complications were encountered and no significant blood loss. IMPRESSION: Status post CT-guided biopsy of left adrenal mass. Signed, Yvone Neu. Miachel Roux, RPVI [facility] Electronically Signed   By: Gilmer Mor D.O.   On: 12/16/2022 10:10     ASSESSMENT/PLAN:  This is a very pleasant 67 year old never smoker Caucasian male with stage IV lung cancer, poorly differentiated carcinoma, felt to be non-small cell lung cancer. He was diagnosed in October 2024.  He presented with metabolic central left upper lobe lung mass invading the mediastinum, small hypermetabolic right hilar lymph nodes, bilateral adrenal gland masses and splenic lesion consistent metastatic disease, hypermetabolic focus in the right gluteus maximus muscle.  His molecular studies show he is MSI high   He is currently undergoing palliative immunotherapy with Keytruda 200 mg IV every 3 weeks. He is status post 1 cycle and tolerated it well.   Labs were reviewed. Recommend that he proceed with cycle #2  today as scheduled.   We will see him back in 3 weeks for evaluation and repeat blood work before undergoing cycle #3  Patient also has a history of lymphoma and he did have radiation to the chest for Hodgkin's lymphoma in 2002 and chemotherapy.  He was previously followed by Dr. Myna Hidalgo.  He will need a CT CAP after C3. I have placed the order today. I expect this around 02/23/23.   If he ever has localized areas of itching, he can use hydrocortisone cream.   I will add on iron studies and ferritin at his  next appointment due to his mild anemia. He will continue his multivitamin. Overall, his anemia is mild and we can monitor.   He is wondering if he can have a dental cleaning. That is fine with his immunotherapy.   The patient was advised to call immediately if he has any concerning symptoms in the interval. The patient voices understanding of current disease status and treatment options and is in agreement with the current care plan. All questions were answered. The patient knows to call the clinic with any problems, questions or concerns. We can certainly see the patient much sooner if necessary     No orders of the defined types were placed in this encounter.   The total time spent in the appointment was 20-29 minutes  Bert Ptacek L Jermisha Hoffart, PA-C 01/15/23

## 2023-01-16 LAB — ACID FAST CULTURE WITH REFLEXED SENSITIVITIES (MYCOBACTERIA): Acid Fast Culture: NEGATIVE

## 2023-01-19 ENCOUNTER — Inpatient Hospital Stay: Payer: 59 | Attending: Physician Assistant

## 2023-01-19 ENCOUNTER — Inpatient Hospital Stay: Payer: 59

## 2023-01-19 ENCOUNTER — Inpatient Hospital Stay (HOSPITAL_BASED_OUTPATIENT_CLINIC_OR_DEPARTMENT_OTHER): Payer: 59 | Admitting: Physician Assistant

## 2023-01-19 VITALS — BP 120/72 | HR 81 | Temp 98.0°F | Resp 15 | Wt 182.5 lb

## 2023-01-19 DIAGNOSIS — Z5112 Encounter for antineoplastic immunotherapy: Secondary | ICD-10-CM | POA: Insufficient documentation

## 2023-01-19 DIAGNOSIS — C3492 Malignant neoplasm of unspecified part of left bronchus or lung: Secondary | ICD-10-CM

## 2023-01-19 DIAGNOSIS — Z79899 Other long term (current) drug therapy: Secondary | ICD-10-CM | POA: Diagnosis not present

## 2023-01-19 DIAGNOSIS — D649 Anemia, unspecified: Secondary | ICD-10-CM | POA: Insufficient documentation

## 2023-01-19 DIAGNOSIS — Z8571 Personal history of Hodgkin lymphoma: Secondary | ICD-10-CM | POA: Insufficient documentation

## 2023-01-19 DIAGNOSIS — C7971 Secondary malignant neoplasm of right adrenal gland: Secondary | ICD-10-CM | POA: Insufficient documentation

## 2023-01-19 DIAGNOSIS — C3412 Malignant neoplasm of upper lobe, left bronchus or lung: Secondary | ICD-10-CM | POA: Insufficient documentation

## 2023-01-19 DIAGNOSIS — Z7982 Long term (current) use of aspirin: Secondary | ICD-10-CM | POA: Diagnosis not present

## 2023-01-19 LAB — CBC WITH DIFFERENTIAL (CANCER CENTER ONLY)
Abs Immature Granulocytes: 0.01 10*3/uL (ref 0.00–0.07)
Basophils Absolute: 0 10*3/uL (ref 0.0–0.1)
Basophils Relative: 1 %
Eosinophils Absolute: 0.2 10*3/uL (ref 0.0–0.5)
Eosinophils Relative: 3 %
HCT: 35.9 % — ABNORMAL LOW (ref 39.0–52.0)
Hemoglobin: 12.2 g/dL — ABNORMAL LOW (ref 13.0–17.0)
Immature Granulocytes: 0 %
Lymphocytes Relative: 15 %
Lymphs Abs: 0.9 10*3/uL (ref 0.7–4.0)
MCH: 31.8 pg (ref 26.0–34.0)
MCHC: 34 g/dL (ref 30.0–36.0)
MCV: 93.5 fL (ref 80.0–100.0)
Monocytes Absolute: 0.4 10*3/uL (ref 0.1–1.0)
Monocytes Relative: 6 %
Neutro Abs: 4.9 10*3/uL (ref 1.7–7.7)
Neutrophils Relative %: 75 %
Platelet Count: 244 10*3/uL (ref 150–400)
RBC: 3.84 MIL/uL — ABNORMAL LOW (ref 4.22–5.81)
RDW: 13.6 % (ref 11.5–15.5)
WBC Count: 6.5 10*3/uL (ref 4.0–10.5)
nRBC: 0 % (ref 0.0–0.2)

## 2023-01-19 LAB — CMP (CANCER CENTER ONLY)
ALT: 15 U/L (ref 0–44)
AST: 19 U/L (ref 15–41)
Albumin: 4.4 g/dL (ref 3.5–5.0)
Alkaline Phosphatase: 73 U/L (ref 38–126)
Anion gap: 5 (ref 5–15)
BUN: 19 mg/dL (ref 8–23)
CO2: 28 mmol/L (ref 22–32)
Calcium: 9.3 mg/dL (ref 8.9–10.3)
Chloride: 103 mmol/L (ref 98–111)
Creatinine: 1.18 mg/dL (ref 0.61–1.24)
GFR, Estimated: >60 mL/min (ref >60)
Glucose, Bld: 111 mg/dL — ABNORMAL HIGH (ref 70–99)
Potassium: 4.7 mmol/L (ref 3.5–5.1)
Sodium: 136 mmol/L (ref 135–145)
Total Bilirubin: 0.9 mg/dL (ref <1.2)
Total Protein: 7 g/dL (ref 6.5–8.1)

## 2023-01-19 MED ORDER — HEPARIN SOD (PORK) LOCK FLUSH 100 UNIT/ML IV SOLN
500.0000 [IU] | Freq: Once | INTRAVENOUS | Status: AC | PRN
Start: 2023-01-19 — End: 2023-01-19
  Administered 2023-01-19: 500 [IU]

## 2023-01-19 MED ORDER — SODIUM CHLORIDE 0.9% FLUSH
10.0000 mL | INTRAVENOUS | Status: DC | PRN
  Administered 2023-01-19: 10 mL

## 2023-01-19 MED ORDER — SODIUM CHLORIDE 0.9 % IV SOLN: INTRAVENOUS | Status: DC

## 2023-01-19 MED ORDER — SODIUM CHLORIDE 0.9 % IV SOLN
200.0000 mg | Freq: Once | INTRAVENOUS | Status: AC
  Administered 2023-01-19: 200 mg via INTRAVENOUS
  Filled 2023-01-19: qty 200

## 2023-01-19 NOTE — Patient Instructions (Signed)

## 2023-01-20 ENCOUNTER — Encounter: Payer: Self-pay | Admitting: Internal Medicine

## 2023-01-20 ENCOUNTER — Ambulatory Visit (INDEPENDENT_AMBULATORY_CARE_PROVIDER_SITE_OTHER): Payer: 59

## 2023-01-20 ENCOUNTER — Other Ambulatory Visit: Payer: Self-pay

## 2023-01-20 ENCOUNTER — Ambulatory Visit: Payer: 59 | Attending: Internal Medicine | Admitting: Internal Medicine

## 2023-01-20 VITALS — BP 122/78 | HR 80 | Ht 71.0 in | Wt 182.0 lb

## 2023-01-20 DIAGNOSIS — I442 Atrioventricular block, complete: Secondary | ICD-10-CM

## 2023-01-20 DIAGNOSIS — I5022 Chronic systolic (congestive) heart failure: Secondary | ICD-10-CM

## 2023-01-20 LAB — CUP PACEART INCLINIC DEVICE CHECK
Battery Remaining Longevity: 121 mo
Battery Voltage: 3.09 V
Brady Statistic AP VP Percent: 86.18 %
Brady Statistic AP VS Percent: 0.03 %
Brady Statistic AS VP Percent: 13.15 %
Brady Statistic AS VS Percent: 0.64 %
Brady Statistic RA Percent Paced: 86.61 %
Brady Statistic RV Percent Paced: 99.32 %
Date Time Interrogation Session: 20241210144520
Implantable Lead Connection Status: 753985
Implantable Lead Connection Status: 753985
Implantable Lead Connection Status: 753985
Implantable Lead Implant Date: 20170922
Implantable Lead Implant Date: 20170922
Implantable Lead Implant Date: 20240909
Implantable Lead Location: 753859
Implantable Lead Location: 753860
Implantable Lead Location: 753860
Implantable Lead Model: 3830
Implantable Lead Model: 5076
Implantable Lead Model: 5076
Implantable Pulse Generator Implant Date: 20240909
Lead Channel Impedance Value: 2717 Ohm
Lead Channel Impedance Value: 342 Ohm
Lead Channel Impedance Value: 361 Ohm
Lead Channel Impedance Value: 380 Ohm
Lead Channel Impedance Value: 475 Ohm
Lead Channel Impedance Value: 494 Ohm
Lead Channel Impedance Value: 494 Ohm
Lead Channel Impedance Value: 608 Ohm
Lead Channel Impedance Value: 760 Ohm
Lead Channel Pacing Threshold Amplitude: 0.375 V
Lead Channel Pacing Threshold Amplitude: 0.5 V
Lead Channel Pacing Threshold Amplitude: 0.5 V
Lead Channel Pacing Threshold Amplitude: 0.875 V
Lead Channel Pacing Threshold Pulse Width: 0.4 ms
Lead Channel Pacing Threshold Pulse Width: 0.4 ms
Lead Channel Pacing Threshold Pulse Width: 0.4 ms
Lead Channel Pacing Threshold Pulse Width: 0.5 ms
Lead Channel Sensing Intrinsic Amplitude: 0 mV
Lead Channel Sensing Intrinsic Amplitude: 1.875 mV
Lead Channel Sensing Intrinsic Amplitude: 20.875 mV
Lead Channel Sensing Intrinsic Amplitude: 5.625 mV
Lead Channel Setting Pacing Amplitude: 1 V
Lead Channel Setting Pacing Amplitude: 1.5 V
Lead Channel Setting Pacing Amplitude: 2.5 V
Lead Channel Setting Pacing Pulse Width: 0.4 ms
Lead Channel Setting Pacing Pulse Width: 0.4 ms
Lead Channel Setting Sensing Sensitivity: 2.8 mV
Zone Setting Status: 755011
Zone Setting Status: 755011

## 2023-01-20 NOTE — Patient Instructions (Signed)

## 2023-01-20 NOTE — Progress Notes (Signed)
HPI [name] returns today for followup. He is a pleasant 67 yo man with a h/o chronic systolic heart failure, EF 35%, CHB, who underwent biv PPM insertion several years ago. He was bothered by diaghragmatic stimulation. He has continued to have class 3 symptoms and pacing induced LBBB. He underwent extraction and insertion of a new biv system 3 months ago. In the interim he has been diagnosed with non-small cell lung CA. He is undergoing chemotherapy. Immunotherapy. 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 Allergies  Allergen Reactions   Other     Pt reported allergic to dust mites that causes of sneezing, runny nose     Current Outpatient Medications  Medication Sig Dispense Refill   albuterol (VENTOLIN HFA) 108 (90 Base) MCG/ACT inhaler Inhale 1-2 puffs into the lungs every 6 (six) hours as needed for wheezing or shortness of breath. 8 g 2   aspirin EC 81 MG tablet Take 81 mg by mouth daily.     atorvastatin (LIPITOR) 40 MG tablet Take 1 tablet (40 mg total) by mouth daily. 90 tablet 3   carvedilol (COREG) 6.25 MG tablet Take 1 tablet (6.25 mg total) by mouth 2 (two) times daily with a meal. 180 tablet 3   Cholecalciferol 125 MCG (5000 UT) TABS Take 1 tablet by mouth daily. 30 tablet 6   levothyroxine (SYNTHROID) 75 MCG tablet Take 1 tablet (75 mcg total) by mouth daily before breakfast. 90 tablet 3   lidocaine-prilocaine (EMLA) cream Apply 1 Application topically as needed. 30 g 2   meloxicam (MOBIC) 15 MG tablet Take 1 tablet (15 mg total) by mouth every day as needed 30 tablet 1   Multiple Vitamin (MULTIVITAMIN WITH MINERALS) TABS tablet Take 1 tablet by mouth daily.     ondansetron (ZOFRAN) 8 MG tablet Take 1 tablet (8 mg total) by mouth every 8 (eight) hours as needed for nausea or vomiting. 30 tablet 2   sacubitril-valsartan (ENTRESTO) 24-26 MG Take 1 tablet by mouth 2 (two) times daily. 180 tablet 3   sildenafil (VIAGRA) 50 MG tablet Take 1 tablet  (50 mg total) by mouth daily as needed for erectile dysfunction. 30 tablet 3   spironolactone (ALDACTONE) 25 MG tablet Take one-half tablet (12.5 mg total) by mouth daily. 45 tablet 3   tamsulosin (FLOMAX) 0.4 MG CAPS capsule Take 1 capsule (0.4 mg total) by mouth daily as needed. 30 capsule 1   traZODone (DESYREL) 50 MG tablet Take 1 tablet (50 mg total) by mouth at bedtime as needed for sleep. (Patient taking differently: Take 50 mg by mouth at bedtime as needed for sleep. 1.5 or 2 po q night)     No current facility-administered medications for this visit.     Past Medical History:  Diagnosis Date   Abnormal TSH    Arthritis    ddd   Atrial fibrillation and flutter (HCC)    s/p DCCV 03/26/2018   CAD in native artery    a. 11/2015: s/p 2 vessel PCI of the first diagonal branch of the LAD in the mid left circumflex with DES. // s/p CABG 01/2018   Chronic combined systolic and diastolic CHF (congestive heart failure) (HCC)    sees Dr. Johney Frame    Complete heart block (HCC)    a. s/p Medtronic ppm 10/2015.   Dyspnea    with exertion   History of radiation therapy 2002   Hodgkin disease (HCC) 2002  a. s/p chest radiation and chemotherapy   Hypertension    Hypothyroidism    sees Dr. Elvera Lennox    Incidental pulmonary nodule 01/19/2018   Left apical opacity - possible scarring   Inguinal hernia 10/14/2022   bilateral   Mitral regurgitation    s/p bioprosthetic MV replacement 01/2018   Pacemaker 10/2015   Medtronic   PVC's (premature ventricular contractions)    Right bundle branch block    S/P CABG x 2 01/27/2018   LIMA to LAD, SVG to RCA, EVH via right thigh   S/P mitral valve replacement with bioprosthetic valve 01/27/2018   29 mm [facility] Mitral stented bovine pericardial tissue valve    ROS:   All systems reviewed and negative except as noted in the HPI.   Past Surgical History:  Procedure Laterality Date   A-FLUTTER ABLATION N/A 06/20/2021   Procedure: A-FLUTTER  ABLATION;  Surgeon: Marinus Maw, MD;  Location: [facility] INVASIVE CV LAB;  Service: Cardiovascular;  Laterality: N/A;   BIV UPGRADE N/A 02/08/2019   Procedure: UPGRADE TO BIV PPM;  Surgeon: Hillis Range, MD;  Location: [facility] INVASIVE CV LAB;  Service: Cardiovascular;  Laterality: N/A;   BRONCHIAL BIOPSY  12/02/2022   Procedure: BRONCHIAL BIOPSIES;  Surgeon: Leslye Peer, MD;  Location: [facility] ENDOSCOPY;  Service: Pulmonary;;   BRONCHIAL BRUSHINGS  12/02/2022   Procedure: BRONCHIAL BRUSHINGS;  Surgeon: Leslye Peer, MD;  Location: [facility] ENDOSCOPY;  Service: Pulmonary;;   BRONCHIAL NEEDLE ASPIRATION BIOPSY  12/02/2022   Procedure: BRONCHIAL NEEDLE ASPIRATION BIOPSIES;  Surgeon: Leslye Peer, MD;  Location: [facility] ENDOSCOPY;  Service: Pulmonary;;   BRONCHIAL WASHINGS  12/02/2022   Procedure: BRONCHIAL WASHINGS;  Surgeon: Leslye Peer, MD;  Location: [facility] ENDOSCOPY;  Service: Pulmonary;;   CARDIAC CATHETERIZATION N/A 11/02/2015   Procedure: Left Heart Cath and Coronary Angiography;  Surgeon: Tonny Bollman, MD;  Location: [facility] INVASIVE CV LAB;  Service: Cardiovascular;  Laterality: N/A;   CARDIAC CATHETERIZATION N/A 11/21/2015   Procedure: Coronary Stent Intervention;  Surgeon: Tonny Bollman, MD;  Location: [facility] INVASIVE CV LAB;  Service: Cardiovascular;  Laterality: N/A;   CARDIAC CATHETERIZATION  11/2017   CARDIOVERSION N/A 03/26/2018   Procedure: CARDIOVERSION;  Surgeon: Parke Poisson, MD;  Location: [facility] ENDOSCOPY;  Service: Cardiovascular;  Laterality: N/A;   COLONOSCOPY  01/16/2015   per Dr. Christella Hartigan, benign polyps, repeat in 10 yrs    CORONARY ARTERY BYPASS GRAFT N/A 01/27/2018   Procedure: CORONARY ARTERY BYPASS GRAFTING (CABG) x two, using left internal mammary artery and right leg greater saphenous vein harvested endoscopically;  Surgeon: Purcell Nails, MD;  Location: [facility] OR;  Service: Open Heart Surgery;  Laterality: N/A;   EP IMPLANTABLE DEVICE N/A 11/02/2015   MDT Adivisa MRI conditional dual  chamber pacemaker implanted by Dr Johney Frame for complete heart block   FEMUR IM NAIL Left 07/01/2022   Procedure: INTRAMEDULLARY (IM) NAIL FEMORAL LEFT ANTEGRADE;  Surgeon: Myrene Galas, MD;  Location: [facility] OR;  Service: Orthopedics;  Laterality: Left;   IR IMAGING GUIDED PORT INSERTION  01/02/2023   KNEE SURGERY Right 1972   LEAD EXTRACTION N/A 10/20/2022   Procedure: LEAD EXTRACTION;  Surgeon: Marinus Maw, MD;  Location: [facility] INVASIVE CV LAB;  Service: Cardiovascular;  Laterality: N/A;   LUMBAR LAMINECTOMY  1996   MITRAL VALVE REPLACEMENT N/A 01/27/2018   Procedure: MITRAL VALVE (MV) REPLACEMENT using Magna Mitral Ease Valve Size ;  Surgeon: Purcell Nails, MD;  Location: [facility] OR;  Service: Open  Heart Surgery;  Laterality: N/A;   RIGHT/LEFT HEART CATH AND CORONARY ANGIOGRAPHY N/A 12/02/2017   Procedure: RIGHT/LEFT HEART CATH AND CORONARY ANGIOGRAPHY;  Surgeon: Corky Crafts, MD;  Location: [facility] INVASIVE CV LAB;  Service: Cardiovascular;  Laterality: N/A;   TEE WITHOUT CARDIOVERSION N/A 12/09/2017   Procedure: TRANSESOPHAGEAL ECHOCARDIOGRAM (TEE);  Surgeon: Jodelle Red, MD;  Location: [facility] ENDOSCOPY;  Service: Cardiovascular;  Laterality: N/A;   TEE WITHOUT CARDIOVERSION N/A 01/27/2018   Procedure: TRANSESOPHAGEAL ECHOCARDIOGRAM (TEE);  Surgeon: Purcell Nails, MD;  Location: [facility] OR;  Service: Open Heart Surgery;  Laterality: N/A;   VIDEO BRONCHOSCOPY WITH RADIAL ENDOBRONCHIAL ULTRASOUND  12/02/2022   Procedure: VIDEO BRONCHOSCOPY WITH RADIAL ENDOBRONCHIAL ULTRASOUND;  Surgeon: Leslye Peer, MD;  Location: [facility] ENDOSCOPY;  Service: Pulmonary;;     Family History  Problem Relation Age of Onset   Cancer Other        prostate father hx   Heart disease Mother    Heart disease Father    Colon cancer Neg Hx      Social History   Socioeconomic History   Marital status: Married    Spouse name: Not on file   Number of children: 3   Years of education: Masters   Highest  education level: Not on file  Occupational History   Occupation: lawyer    Comment: [employer]  Tobacco Use   Smoking status: Never   Smokeless tobacco: Never  Vaping Use   Vaping status: Never Used  Substance and Sexual Activity   Alcohol use: Yes    Alcohol/week: 3.0 standard drinks of alcohol    Types: 3 Glasses of wine per week   Drug use: No   Sexual activity: Not on file  Other Topics Concern   Not on file  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[place]   Social Determinants of Health   Financial Resource Strain: Not on file  Food Insecurity: No Food Insecurity (10/20/2022)   Hunger Vital Sign    Worried About Running Out of Food in the Last Year: Never true    Ran Out of Food in the Last Year: Never true  Transportation Needs: No Transportation Needs (10/20/2022)   PRAPARE - Administrator, Civil Service (Medical): No    Lack of Transportation (Non-Medical): No  Physical Activity: Not on file  Stress: Not on file  Social Connections: Not on file  Intimate Partner Violence: Not At Risk (10/20/2022)   Humiliation, Afraid, Rape, and Kick questionnaire    Fear of Current or Ex-Partner: No    Emotionally Abused: No    Physically Abused: No    Sexually Abused: No     BP 122/78   Pulse 80   Ht 5\' 11"  (1.803 m)   Wt 182 lb (82.6 kg)   SpO2 99%   BMI 25.38 kg/m   Physical Exam:  Well appearing NAD HEENT: Unremarkable Neck:  No JVD, no thyromegally Lymphatics:  No adenopathy Back:  No CVA tenderness Lungs:  Clear with no wheezes HEART:  Regular rate rhythm, no murmurs, no rubs, no clicks Abd:  soft, positive bowel sounds, no organomegally, no rebound, no guarding Ext:  2 plus pulses, no edema, no cyanosis, no clubbing Skin:  No rashes no nodules Neuro:  CN II through XII intact, motor grossly intact  EKG - nsr with left bundle pacing  DEVICE  Normal device function.  See PaceArt for details.  Assess/Plan:  Chronic systolic heart failure -He is s/p biv upgrade. We could not access the CS after the old dislodged LV lead was extracted. He has a nice left bundle area lead. CHB - he has an escape today at 31/min. 3. PPM - His Medtronic biv pacing system is working well. We have adjusted his pacing.  4. HTN - his bp is well controlled. 5. Lung CA - he is pending followup next month. He has an MRI compatible pacing system and can undergo MRI as needed.     Lewayne Bunting, MD

## 2023-01-21 LAB — CUP PACEART REMOTE DEVICE CHECK
Battery Remaining Longevity: 118 mo
Battery Voltage: 3.09 V
Brady Statistic AP VP Percent: 86.1 %
Brady Statistic AP VS Percent: 0.03 %
Brady Statistic AS VP Percent: 13.23 %
Brady Statistic AS VS Percent: 0.65 %
Brady Statistic RA Percent Paced: 86.54 %
Brady Statistic RV Percent Paced: 99.32 %
Date Time Interrogation Session: 20241210023402
Implantable Lead Connection Status: 753985
Implantable Lead Connection Status: 753985
Implantable Lead Connection Status: 753985
Implantable Lead Implant Date: 20170922
Implantable Lead Implant Date: 20170922
Implantable Lead Implant Date: 20240909
Implantable Lead Location: 753859
Implantable Lead Location: 753860
Implantable Lead Location: 753860
Implantable Lead Model: 3830
Implantable Lead Model: 5076
Implantable Lead Model: 5076
Implantable Pulse Generator Implant Date: 20240909
Lead Channel Impedance Value: 2679 Ohm
Lead Channel Impedance Value: 304 Ohm
Lead Channel Impedance Value: 323 Ohm
Lead Channel Impedance Value: 361 Ohm
Lead Channel Impedance Value: 437 Ohm
Lead Channel Impedance Value: 437 Ohm
Lead Channel Impedance Value: 456 Ohm
Lead Channel Impedance Value: 551 Ohm
Lead Channel Impedance Value: 665 Ohm
Lead Channel Pacing Threshold Amplitude: 0.375 V
Lead Channel Pacing Threshold Amplitude: 0.5 V
Lead Channel Pacing Threshold Amplitude: 0.875 V
Lead Channel Pacing Threshold Pulse Width: 0.4 ms
Lead Channel Pacing Threshold Pulse Width: 0.4 ms
Lead Channel Pacing Threshold Pulse Width: 0.4 ms
Lead Channel Sensing Intrinsic Amplitude: 2.875 mV
Lead Channel Sensing Intrinsic Amplitude: 2.875 mV
Lead Channel Sensing Intrinsic Amplitude: 20.875 mV
Lead Channel Setting Pacing Amplitude: 1 V
Lead Channel Setting Pacing Amplitude: 1.5 V
Lead Channel Setting Pacing Amplitude: 2.5 V
Lead Channel Setting Pacing Pulse Width: 0.4 ms
Lead Channel Setting Pacing Pulse Width: 0.4 ms
Lead Channel Setting Sensing Sensitivity: 2.8 mV
Zone Setting Status: 755011
Zone Setting Status: 755011

## 2023-01-26 ENCOUNTER — Ambulatory Visit: Payer: 59 | Admitting: Internal Medicine

## 2023-01-26 ENCOUNTER — Other Ambulatory Visit: Payer: 59

## 2023-01-26 ENCOUNTER — Ambulatory Visit: Payer: 59

## 2023-01-26 ENCOUNTER — Other Ambulatory Visit: Payer: Self-pay

## 2023-01-27 ENCOUNTER — Other Ambulatory Visit: Payer: Self-pay

## 2023-01-27 ENCOUNTER — Other Ambulatory Visit: Payer: Self-pay | Admitting: Emergency Medicine

## 2023-01-27 ENCOUNTER — Other Ambulatory Visit (HOSPITAL_COMMUNITY): Payer: Self-pay

## 2023-01-27 MED ORDER — TAMSULOSIN HCL 0.4 MG PO CAPS
0.4000 mg | ORAL_CAPSULE | Freq: Every day | ORAL | 1 refills | Status: DC
  Filled 2023-01-27: qty 30, 30d supply, fill #0
  Filled 2023-02-22: qty 30, 30d supply, fill #1

## 2023-01-28 ENCOUNTER — Other Ambulatory Visit: Payer: Self-pay

## 2023-01-28 ENCOUNTER — Other Ambulatory Visit (HOSPITAL_COMMUNITY): Payer: Self-pay

## 2023-01-28 MED ORDER — MELOXICAM 15 MG PO TABS
15.0000 mg | ORAL_TABLET | Freq: Every day | ORAL | 1 refills | Status: DC | PRN
  Filled 2023-01-28: qty 30, 30d supply, fill #0
  Filled 2023-02-22: qty 30, 30d supply, fill #1

## 2023-01-30 ENCOUNTER — Other Ambulatory Visit: Payer: Self-pay

## 2023-01-30 MED ORDER — TRAZODONE HCL 50 MG PO TABS
50.0000 mg | ORAL_TABLET | Freq: Every evening | ORAL | 1 refills | Status: DC | PRN
  Filled 2023-01-30: qty 90, 90d supply, fill #0
  Filled 2023-05-11: qty 90, 90d supply, fill #1

## 2023-01-30 NOTE — Telephone Encounter (Signed)
Done

## 2023-01-30 NOTE — Addendum Note (Signed)
Addended by: Gershon Crane A on: 01/30/2023 01:12 PM   Modules accepted: Orders

## 2023-02-08 NOTE — Progress Notes (Unsigned)
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 He was diagnosed in October 2024.  He presented with metabolic central left upper lobe lung mass invading the mediastinum, small hypermetabolic right hilar lymph nodes, bilateral adrenal gland masses and splenic lesion consistent metastatic disease, hypermetabolic focus in the right gluteus maximus muscle.    Molecular studies by [employer] show MSI high.    Foundation One requested on 12/22/22   PRIOR THERAPY: None   CURRENT THERAPY:  Immunotherapy with Keytruda 200 mg IV every 3 weeks.  First dose expected on 12/29/22. S/p C2  INTERVAL HISTORY Tyler Hendrix returns for follow up and treatment. Last seen by Corbin Ade, PA 12/9 with cycle 2 Pembro.  After cycle 1 he had a localized area of itching over the pacemaker which lasted 45 minutes.  After cycle 2 he had itching on his leg which lasted a little longer but has resolved.  Denies other rash.  He is eating and drinking, remains mobile and goes to the gym.  Denies cough, chest pain, dyspnea, bowel issues, bleeding, new pain or other specific complaints.  ROS  All other systems reviewed and negative  Past Medical History:  Diagnosis Date   Abnormal TSH    Arthritis    ddd   Atrial fibrillation and flutter (HCC)    s/p DCCV 03/26/2018   CAD in native artery    a. 11/2015: s/p 2 vessel PCI of the first diagonal branch of the LAD in the mid left circumflex with DES. // s/p CABG 01/2018   Chronic combined systolic and diastolic CHF (congestive heart failure) (HCC)    sees Dr. Johney Frame    Complete heart block (HCC)    a. s/p Medtronic ppm 10/2015.   Dyspnea    with exertion   History of radiation therapy 2002    Hodgkin disease (HCC) 2002   a. s/p chest radiation and chemotherapy   Hypertension    Hypothyroidism    sees Dr. Elvera Lennox    Incidental pulmonary nodule 01/19/2018   Left apical opacity - possible scarring   Inguinal hernia 10/14/2022   bilateral   Mitral regurgitation    s/p bioprosthetic MV replacement 01/2018   Pacemaker 10/2015   Medtronic   PVC's (premature ventricular contractions)    Right bundle branch block    S/P CABG x 2 01/27/2018   LIMA to LAD, SVG to RCA, EVH via right thigh   S/P mitral valve replacement with bioprosthetic valve 01/27/2018   29 mm [facility] Mitral stented bovine pericardial tissue valve     Past Surgical History:  Procedure Laterality Date   A-FLUTTER ABLATION N/A 06/20/2021   Procedure: A-FLUTTER ABLATION;  Surgeon: Marinus Maw, MD;  Location: [facility] INVASIVE CV LAB;  Service: Cardiovascular;  Laterality: N/A;   BIV UPGRADE N/A 02/08/2019   Procedure: UPGRADE TO BIV PPM;  Surgeon: Hillis Range, MD;  Location: [facility] INVASIVE CV LAB;  Service: Cardiovascular;  Laterality: N/A;   BRONCHIAL BIOPSY  12/02/2022   Procedure: BRONCHIAL BIOPSIES;  Surgeon: Leslye Peer, MD;  Location: [facility] ENDOSCOPY;  Service: Pulmonary;;   BRONCHIAL BRUSHINGS  12/02/2022   Procedure: BRONCHIAL BRUSHINGS;  Surgeon: Leslye Peer, MD;  Location: [facility] ENDOSCOPY;  Service: Pulmonary;;   BRONCHIAL NEEDLE ASPIRATION BIOPSY  12/02/2022   Procedure: BRONCHIAL NEEDLE ASPIRATION BIOPSIES;  Surgeon: Leslye Peer, MD;  Location: [facility] ENDOSCOPY;  Service: Pulmonary;;   BRONCHIAL WASHINGS  12/02/2022   Procedure: BRONCHIAL WASHINGS;  Surgeon: Leslye Peer, MD;  Location: [facility] ENDOSCOPY;  Service: Pulmonary;;   CARDIAC CATHETERIZATION N/A 11/02/2015   Procedure: Left Heart Cath and Coronary Angiography;  Surgeon: Tonny Bollman, MD;  Location: [facility] INVASIVE CV LAB;  Service: Cardiovascular;  Laterality: N/A;   CARDIAC CATHETERIZATION N/A 11/21/2015   Procedure: Coronary Stent  Intervention;  Surgeon: Tonny Bollman, MD;  Location: [facility] INVASIVE CV LAB;  Service: Cardiovascular;  Laterality: N/A;   CARDIAC CATHETERIZATION  11/2017   CARDIOVERSION N/A 03/26/2018   Procedure: CARDIOVERSION;  Surgeon: Parke Poisson, MD;  Location: [facility] ENDOSCOPY;  Service: Cardiovascular;  Laterality: N/A;   COLONOSCOPY  01/16/2015   per Dr. Christella Hartigan, benign polyps, repeat in 10 yrs    CORONARY ARTERY BYPASS GRAFT N/A 01/27/2018   Procedure: CORONARY ARTERY BYPASS GRAFTING (CABG) x two, using left internal mammary artery and right leg greater saphenous vein harvested endoscopically;  Surgeon: Purcell Nails, MD;  Location: [facility] OR;  Service: Open Heart Surgery;  Laterality: N/A;   EP IMPLANTABLE DEVICE N/A 11/02/2015   MDT Adivisa MRI conditional dual chamber pacemaker implanted by Dr Johney Frame for complete heart block   FEMUR IM NAIL Left 07/01/2022   Procedure: INTRAMEDULLARY (IM) NAIL FEMORAL LEFT ANTEGRADE;  Surgeon: Myrene Galas, MD;  Location: [facility] OR;  Service: Orthopedics;  Laterality: Left;   IR IMAGING GUIDED PORT INSERTION  01/02/2023   KNEE SURGERY Right 1972   LEAD EXTRACTION N/A 10/20/2022   Procedure: LEAD EXTRACTION;  Surgeon: Marinus Maw, MD;  Location: [facility] INVASIVE CV LAB;  Service: Cardiovascular;  Laterality: N/A;   LUMBAR LAMINECTOMY  1996   MITRAL VALVE REPLACEMENT N/A 01/27/2018   Procedure: MITRAL VALVE (MV) REPLACEMENT using Magna Mitral Ease Valve Size ;  Surgeon: Purcell Nails, MD;  Location: [facility] OR;  Service: Open Heart Surgery;  Laterality: N/A;   RIGHT/LEFT HEART CATH AND CORONARY ANGIOGRAPHY N/A 12/02/2017   Procedure: RIGHT/LEFT HEART CATH AND CORONARY ANGIOGRAPHY;  Surgeon: Corky Crafts, MD;  Location: [facility] INVASIVE CV LAB;  Service: Cardiovascular;  Laterality: N/A;   TEE WITHOUT CARDIOVERSION N/A 12/09/2017   Procedure: TRANSESOPHAGEAL ECHOCARDIOGRAM (TEE);  Surgeon: Jodelle Red, MD;  Location: [facility] ENDOSCOPY;  Service: Cardiovascular;   Laterality: N/A;   TEE WITHOUT CARDIOVERSION N/A 01/27/2018   Procedure: TRANSESOPHAGEAL ECHOCARDIOGRAM (TEE);  Surgeon: Purcell Nails, MD;  Location: [facility] OR;  Service: Open Heart Surgery;  Laterality: N/A;   VIDEO BRONCHOSCOPY WITH RADIAL ENDOBRONCHIAL ULTRASOUND  12/02/2022   Procedure: VIDEO BRONCHOSCOPY WITH RADIAL ENDOBRONCHIAL ULTRASOUND;  Surgeon: Leslye Peer, MD;  Location: [facility] ENDOSCOPY;  Service: Pulmonary;;     Outpatient Encounter Medications as of 02/09/2023  Medication Sig Note   albuterol (VENTOLIN HFA) 108 (90 Base) MCG/ACT inhaler Inhale 1-2 puffs into the lungs every 6 (six) hours as needed for wheezing or shortness of breath.    aspirin EC 81 MG tablet Take 81 mg by mouth daily.    atorvastatin (LIPITOR) 40 MG tablet Take 1 tablet (40 mg total) by mouth daily.    carvedilol (COREG) 6.25 MG tablet Take 1 tablet (6.25 mg total) by mouth 2 (two) times daily with a meal.    Cholecalciferol 125 MCG (5000 UT) TABS Take 1 tablet by mouth daily.  levothyroxine (SYNTHROID) 75 MCG tablet Take 1 tablet (75 mcg total) by mouth daily before breakfast.    lidocaine-prilocaine (EMLA) cream Apply 1 Application topically as needed.    meloxicam (MOBIC) 15 MG tablet Take 1 tablet (15 mg total) by mouth daily as needed.    Multiple Vitamin (MULTIVITAMIN WITH MINERALS) TABS tablet Take 1 tablet by mouth daily.    spironolactone (ALDACTONE) 25 MG tablet Take one-half tablet (12.5 mg total) by mouth daily. 11/28/2022: Pt ran out, waiting on refill   tamsulosin (FLOMAX) 0.4 MG CAPS capsule Take 1 capsule (0.4 mg total) by mouth daily.    traZODone (DESYREL) 50 MG tablet Take 1 tablet (50 mg total) by mouth at bedtime as needed for sleep.    ondansetron (ZOFRAN) 8 MG tablet Take 1 tablet (8 mg total) by mouth every 8 (eight) hours as needed for nausea or vomiting. 01/02/2023: Hasn't started   sildenafil (VIAGRA) 50 MG tablet Take 1 tablet (50 mg total) by mouth daily as needed for erectile  dysfunction. (Patient not taking: Reported on 02/09/2023)    No facility-administered encounter medications on file as of 02/09/2023.     Today's Vitals   02/09/23 1003 02/09/23 1007  BP: 121/78   Pulse: 76   Resp: 17   Temp: 98.3 F (36.8 C)   TempSrc: Temporal   SpO2: 100%   Weight: 185 lb 1.6 oz (84 kg)   PainSc:  0-No pain   Body mass index is 25.82 kg/m.   PHYSICAL EXAM GENERAL:alert, no distress and comfortable SKIN: no rash  EYES: sclera clear NECK: without mass LUNGS: clear with normal breathing effort HEART: regular rate & rhythm, no lower extremity edema ABDOMEN: abdomen soft, non-tender and normal bowel sounds NEURO: alert & oriented x 3 with fluent speech, no focal motor/sensory deficits PAC without erythema    CBC    Component Value Date/Time   WBC 6.8 02/09/2023 0947   WBC 7.4 12/16/2022 0659   RBC 3.84 (L) 02/09/2023 0947   HGB 12.5 (L) 02/09/2023 0947   HGB 14.0 06/06/2021 0919   HGB 15.3 03/16/2009 0907   HGB 15.1 03/12/2007 0923   HCT 35.9 (L) 02/09/2023 0947   HCT 42.0 06/06/2021 0919   HCT 44.8 03/16/2009 0907   HCT 42.7 03/12/2007 0923   PLT 219 02/09/2023 0947   PLT 353 06/06/2021 0919   MCV 93.5 02/09/2023 0947   MCV 96 06/06/2021 0919   MCV 93 03/16/2009 0907   MCV 89.3 03/12/2007 0923   MCH 32.6 02/09/2023 0947   MCHC 34.8 02/09/2023 0947   RDW 13.9 02/09/2023 0947   RDW 13.4 06/06/2021 0919   RDW 11.9 03/16/2009 0907   RDW 13.1 03/12/2007 0923   LYMPHSABS 1.0 02/09/2023 0947   LYMPHSABS 1.2 06/06/2021 0919   LYMPHSABS 1.2 03/16/2009 0907   LYMPHSABS 1.1 03/12/2007 0923   MONOABS 0.4 02/09/2023 0947   MONOABS 0.3 03/12/2007 0923   EOSABS 0.2 02/09/2023 0947   EOSABS 0.2 06/06/2021 0919   EOSABS 0.2 03/16/2009 0907   BASOSABS 0.1 02/09/2023 0947   BASOSABS 0.0 06/06/2021 0919   BASOSABS 0.0 03/16/2009 0907   BASOSABS 0.0 03/12/2007 0923     CMP     Component Value Date/Time   NA 137 02/09/2023 0947   NA 137 06/06/2021  0919   K 4.8 02/09/2023 0947   CL 106 02/09/2023 0947   CO2 25 02/09/2023 0947   GLUCOSE 127 (H) 02/09/2023 0947   BUN 25 (H) 02/09/2023 6270  BUN 19 06/06/2021 0919   CREATININE 1.20 02/09/2023 0947   CREATININE 1.04 01/01/2016 0801   CALCIUM 9.2 02/09/2023 0947   PROT 6.6 02/09/2023 0947   ALBUMIN 4.2 02/09/2023 0947   AST 17 02/09/2023 0947   ALT 16 02/09/2023 0947   ALKPHOS 54 02/09/2023 0947   BILITOT 0.8 02/09/2023 0947   GFRNONAA >60 02/09/2023 0947   GFRNONAA 78 01/01/2016 0801   GFRAA 68 01/28/2019 1413   GFRAA >89 01/01/2016 0801     ASSESSMENT & PLAN:67 year old never smoker Caucasian male   NSCLC, stage IV lung cancer, poorly differentiated, MSI-H  -Diagnosed 10/ 2024 after presenting with metabolic central left upper lobe lung mass invading the mediastinum, small hypermetabolic right hilar lymph nodes, bilateral adrenal gland masses and splenic lesion consistent metastatic disease, hypermetabolic focus in the right gluteus maximus muscle.   -Began palliative immunotherapy with Keytruda q3 weeks starting 12/29/22 -Mr. Reppert appears stable, s/p C2 Pembro, tolerating well mild pruritus without rash, otherwise no significant toxicities -SEs are well-managed with supportive care at home.  He is able to recover and function well with good performance status.  There is no clinical evidence of disease progression -Labs reviewed, we will follow-up the pending iron studies and thyroid panel from today -Stay with cycle 3 Pembro today and restaging CT 1/13 as scheduled -F/up and C4 in 3 weeks  H/o Lymphoma   -S/p chemo and radiation to the chest for Hodgkin's lymphoma in 2002  -Followed by Dr. Myna Hidalgo.  Genetics -Due to his MSI-H lung cancer, germline testing is recommended. He agrees and I referred him to genetics   PLAN: -Labs reviewed, TSH and iron studies are pending -Proceed with cycle 3 Pembro today as scheduled -Reviewed symptom management for rash/itching  (topical benadryl or hydrocortisone PRN) -Restaging CT 1/13 as scheduled -Follow-up with image results and cycle 4 in 3 weeks -Referral to genetics   Orders Placed This Encounter  Procedures   Ambulatory referral to Genetics    Referral Priority:   Routine    Referral Type:   Consultation    Referral Reason:   Specialty Services Required    Number of Visits Requested:   1      All questions were answered. The patient knows to call the clinic with any problems, questions or concerns. No barriers to learning were detected. I spent 20 minutes counseling the patient face to face. The total time spent in the appointment was 30 minutes and more than 50% was on counseling, review of test results, and coordination of care.   Santiago Glad, NP-C 02/09/2023

## 2023-02-09 ENCOUNTER — Encounter: Payer: Self-pay | Admitting: Physician Assistant

## 2023-02-09 ENCOUNTER — Inpatient Hospital Stay (HOSPITAL_BASED_OUTPATIENT_CLINIC_OR_DEPARTMENT_OTHER): Payer: 59 | Admitting: Nurse Practitioner

## 2023-02-09 ENCOUNTER — Encounter: Payer: Self-pay | Admitting: Nurse Practitioner

## 2023-02-09 ENCOUNTER — Inpatient Hospital Stay: Payer: 59

## 2023-02-09 VITALS — BP 121/78 | HR 76 | Temp 98.3°F | Resp 17 | Wt 185.1 lb

## 2023-02-09 DIAGNOSIS — C3492 Malignant neoplasm of unspecified part of left bronchus or lung: Secondary | ICD-10-CM

## 2023-02-09 DIAGNOSIS — C3412 Malignant neoplasm of upper lobe, left bronchus or lung: Secondary | ICD-10-CM | POA: Diagnosis not present

## 2023-02-09 DIAGNOSIS — D649 Anemia, unspecified: Secondary | ICD-10-CM | POA: Diagnosis not present

## 2023-02-09 DIAGNOSIS — Z79899 Other long term (current) drug therapy: Secondary | ICD-10-CM | POA: Diagnosis not present

## 2023-02-09 DIAGNOSIS — C7971 Secondary malignant neoplasm of right adrenal gland: Secondary | ICD-10-CM | POA: Diagnosis not present

## 2023-02-09 DIAGNOSIS — Z5112 Encounter for antineoplastic immunotherapy: Secondary | ICD-10-CM | POA: Diagnosis not present

## 2023-02-09 DIAGNOSIS — Z7982 Long term (current) use of aspirin: Secondary | ICD-10-CM | POA: Diagnosis not present

## 2023-02-09 DIAGNOSIS — Z8571 Personal history of Hodgkin lymphoma: Secondary | ICD-10-CM | POA: Diagnosis not present

## 2023-02-09 LAB — CMP (CANCER CENTER ONLY)
ALT: 16 U/L (ref 0–44)
AST: 17 U/L (ref 15–41)
Albumin: 4.2 g/dL (ref 3.5–5.0)
Alkaline Phosphatase: 54 U/L (ref 38–126)
Anion gap: 6 (ref 5–15)
BUN: 25 mg/dL — ABNORMAL HIGH (ref 8–23)
CO2: 25 mmol/L (ref 22–32)
Calcium: 9.2 mg/dL (ref 8.9–10.3)
Chloride: 106 mmol/L (ref 98–111)
Creatinine: 1.2 mg/dL (ref 0.61–1.24)
GFR, Estimated: >60 mL/min (ref >60)
Glucose, Bld: 127 mg/dL — ABNORMAL HIGH (ref 70–99)
Potassium: 4.8 mmol/L (ref 3.5–5.1)
Sodium: 137 mmol/L (ref 135–145)
Total Bilirubin: 0.8 mg/dL (ref 0.0–1.2)
Total Protein: 6.6 g/dL (ref 6.5–8.1)

## 2023-02-09 LAB — CBC WITH DIFFERENTIAL (CANCER CENTER ONLY)
Abs Immature Granulocytes: 0.02 10*3/uL (ref 0.00–0.07)
Basophils Absolute: 0.1 10*3/uL (ref 0.0–0.1)
Basophils Relative: 1 %
Eosinophils Absolute: 0.2 10*3/uL (ref 0.0–0.5)
Eosinophils Relative: 3 %
HCT: 35.9 % — ABNORMAL LOW (ref 39.0–52.0)
Hemoglobin: 12.5 g/dL — ABNORMAL LOW (ref 13.0–17.0)
Immature Granulocytes: 0 %
Lymphocytes Relative: 15 %
Lymphs Abs: 1 10*3/uL (ref 0.7–4.0)
MCH: 32.6 pg (ref 26.0–34.0)
MCHC: 34.8 g/dL (ref 30.0–36.0)
MCV: 93.5 fL (ref 80.0–100.0)
Monocytes Absolute: 0.4 10*3/uL (ref 0.1–1.0)
Monocytes Relative: 6 %
Neutro Abs: 5.1 10*3/uL (ref 1.7–7.7)
Neutrophils Relative %: 75 %
Platelet Count: 219 10*3/uL (ref 150–400)
RBC: 3.84 MIL/uL — ABNORMAL LOW (ref 4.22–5.81)
RDW: 13.9 % (ref 11.5–15.5)
WBC Count: 6.8 10*3/uL (ref 4.0–10.5)
nRBC: 0 % (ref 0.0–0.2)

## 2023-02-09 LAB — IRON AND IRON BINDING CAPACITY (CC-WL,HP ONLY)
Iron: 98 ug/dL (ref 45–182)
Saturation Ratios: 24 % (ref 17.9–39.5)
TIBC: 410 ug/dL (ref 250–450)
UIBC: 312 ug/dL (ref 117–376)

## 2023-02-09 LAB — TSH: TSH: 4.782 u[IU]/mL — ABNORMAL HIGH (ref 0.350–4.500)

## 2023-02-09 LAB — FERRITIN: Ferritin: 63 ng/mL (ref 24–336)

## 2023-02-09 MED ORDER — PEMBROLIZUMAB CHEMO INJECTION 100 MG/4ML
200.0000 mg | Freq: Once | INTRAVENOUS | Status: AC
  Administered 2023-02-09: 200 mg via INTRAVENOUS
  Filled 2023-02-09: qty 200

## 2023-02-09 MED ORDER — SODIUM CHLORIDE 0.9% FLUSH
10.0000 mL | INTRAVENOUS | Status: DC | PRN
Start: 2023-02-09 — End: 2023-02-09
  Administered 2023-02-09: 10 mL

## 2023-02-09 MED ORDER — HEPARIN SOD (PORK) LOCK FLUSH 100 UNIT/ML IV SOLN
500.0000 [IU] | Freq: Once | INTRAVENOUS | Status: AC | PRN
  Administered 2023-02-09: 500 [IU]

## 2023-02-09 MED ORDER — SODIUM CHLORIDE 0.9 % IV SOLN: INTRAVENOUS | Status: DC

## 2023-02-09 NOTE — Patient Instructions (Signed)

## 2023-02-10 ENCOUNTER — Telehealth: Payer: Self-pay | Admitting: Genetic Counselor

## 2023-02-10 ENCOUNTER — Other Ambulatory Visit (HOSPITAL_COMMUNITY): Payer: Self-pay

## 2023-02-10 LAB — T4: T4, Total: 6.7 ug/dL (ref 4.5–12.0)

## 2023-02-10 NOTE — Telephone Encounter (Signed)
Scheduled patient per scheduling message. Patient was made aware of future appointments and will be mailed an appointment reminder.

## 2023-02-12 ENCOUNTER — Other Ambulatory Visit (HOSPITAL_COMMUNITY): Payer: Self-pay

## 2023-02-16 ENCOUNTER — Other Ambulatory Visit: Payer: 59

## 2023-02-16 ENCOUNTER — Ambulatory Visit: Payer: 59 | Admitting: Physician Assistant

## 2023-02-16 ENCOUNTER — Ambulatory Visit: Payer: 59

## 2023-02-18 DIAGNOSIS — C3490 Malignant neoplasm of unspecified part of unspecified bronchus or lung: Secondary | ICD-10-CM | POA: Diagnosis not present

## 2023-02-23 ENCOUNTER — Ambulatory Visit (HOSPITAL_COMMUNITY)
Admission: RE | Admit: 2023-02-23 | Discharge: 2023-02-23 | Disposition: A | Discharge status: Home / Self Care | Payer: 59 | Source: Ambulatory Visit | Attending: Physician Assistant | Admitting: Physician Assistant

## 2023-02-23 ENCOUNTER — Other Ambulatory Visit: Payer: Self-pay

## 2023-02-23 ENCOUNTER — Encounter (HOSPITAL_COMMUNITY): Payer: Self-pay

## 2023-02-23 DIAGNOSIS — C3492 Malignant neoplasm of unspecified part of left bronchus or lung: Secondary | ICD-10-CM | POA: Insufficient documentation

## 2023-02-23 DIAGNOSIS — I700 Atherosclerosis of aorta: Secondary | ICD-10-CM | POA: Diagnosis not present

## 2023-02-23 DIAGNOSIS — C3490 Malignant neoplasm of unspecified part of unspecified bronchus or lung: Secondary | ICD-10-CM | POA: Diagnosis not present

## 2023-02-23 MED ORDER — HEPARIN SOD (PORK) LOCK FLUSH 100 UNIT/ML IV SOLN
INTRAVENOUS | Status: AC
  Filled 2023-02-23: qty 5

## 2023-02-23 MED ORDER — HEPARIN SOD (PORK) LOCK FLUSH 100 UNIT/ML IV SOLN
500.0000 [IU] | Freq: Once | INTRAVENOUS | Status: AC
  Administered 2023-02-23: 500 [IU] via INTRAVENOUS

## 2023-02-23 MED ORDER — IOHEXOL 300 MG/ML  SOLN
30.0000 mL | Freq: Once | INTRAMUSCULAR | Status: AC | PRN
  Administered 2023-02-23: 30 mL via ORAL

## 2023-02-23 MED ORDER — IOHEXOL 300 MG/ML  SOLN
100.0000 mL | Freq: Once | INTRAMUSCULAR | Status: AC | PRN
  Administered 2023-02-23: 100 mL via INTRAVENOUS

## 2023-02-26 ENCOUNTER — Telehealth: Payer: Self-pay | Admitting: Medical Oncology

## 2023-02-26 NOTE — Telephone Encounter (Signed)
Pt will bring [employer] papers to appt on Monday.

## 2023-02-26 NOTE — Progress Notes (Signed)
I reached out to the pt list morning after receiving message from Vincent Peyer, RN that the pt sent her a message about a very large bill from [facility] for the genetic testing that was done on his tissue. I called Bradd Burner, customer experience representative, at [facility] and let her know about the situation. Shanda Bumps tells me that she will reach out to her billing department, as Stage IV cancer is usually covered for tissue testing. I called the pt after speaking to Shanda Bumps, who recommended a financial assistance application be sent to [facility]. I also asked that the pt bring any documentation that [employer] and [employer] medicine sent him. Pt verbalized understanding. I received verbal permission from the pt to fill out the form on his behalf. FAA form faxed to [facility] at 12:27.

## 2023-03-02 ENCOUNTER — Inpatient Hospital Stay: Payer: 59 | Attending: Physician Assistant

## 2023-03-02 ENCOUNTER — Inpatient Hospital Stay: Payer: 59

## 2023-03-02 ENCOUNTER — Inpatient Hospital Stay: Payer: 59 | Attending: Physician Assistant | Admitting: Internal Medicine

## 2023-03-02 ENCOUNTER — Other Ambulatory Visit: Payer: Self-pay | Admitting: Internal Medicine

## 2023-03-02 VITALS — BP 129/64 | HR 70 | Temp 97.9°F | Resp 16 | Ht 71.0 in | Wt 183.7 lb

## 2023-03-02 DIAGNOSIS — C3492 Malignant neoplasm of unspecified part of left bronchus or lung: Secondary | ICD-10-CM

## 2023-03-02 DIAGNOSIS — Z7982 Long term (current) use of aspirin: Secondary | ICD-10-CM | POA: Diagnosis not present

## 2023-03-02 DIAGNOSIS — C7951 Secondary malignant neoplasm of bone: Secondary | ICD-10-CM | POA: Diagnosis not present

## 2023-03-02 DIAGNOSIS — C7971 Secondary malignant neoplasm of right adrenal gland: Secondary | ICD-10-CM | POA: Diagnosis not present

## 2023-03-02 DIAGNOSIS — C3412 Malignant neoplasm of upper lobe, left bronchus or lung: Secondary | ICD-10-CM | POA: Diagnosis not present

## 2023-03-02 DIAGNOSIS — Z79899 Other long term (current) drug therapy: Secondary | ICD-10-CM | POA: Diagnosis not present

## 2023-03-02 DIAGNOSIS — Z5112 Encounter for antineoplastic immunotherapy: Secondary | ICD-10-CM | POA: Diagnosis not present

## 2023-03-02 DIAGNOSIS — C7972 Secondary malignant neoplasm of left adrenal gland: Secondary | ICD-10-CM | POA: Diagnosis not present

## 2023-03-02 DIAGNOSIS — Z95828 Presence of other vascular implants and grafts: Secondary | ICD-10-CM | POA: Insufficient documentation

## 2023-03-02 LAB — CBC WITH DIFFERENTIAL (CANCER CENTER ONLY)
Abs Immature Granulocytes: 0.02 10*3/uL (ref 0.00–0.07)
Basophils Absolute: 0 10*3/uL (ref 0.0–0.1)
Basophils Relative: 1 %
Eosinophils Absolute: 0.2 10*3/uL (ref 0.0–0.5)
Eosinophils Relative: 3 %
HCT: 34.5 % — ABNORMAL LOW (ref 39.0–52.0)
Hemoglobin: 12 g/dL — ABNORMAL LOW (ref 13.0–17.0)
Immature Granulocytes: 0 %
Lymphocytes Relative: 13 %
Lymphs Abs: 1 10*3/uL (ref 0.7–4.0)
MCH: 32.2 pg (ref 26.0–34.0)
MCHC: 34.8 g/dL (ref 30.0–36.0)
MCV: 92.5 fL (ref 80.0–100.0)
Monocytes Absolute: 0.4 10*3/uL (ref 0.1–1.0)
Monocytes Relative: 6 %
Neutro Abs: 5.4 10*3/uL (ref 1.7–7.7)
Neutrophils Relative %: 77 %
Platelet Count: 239 10*3/uL (ref 150–400)
RBC: 3.73 MIL/uL — ABNORMAL LOW (ref 4.22–5.81)
RDW: 13.4 % (ref 11.5–15.5)
WBC Count: 7.1 10*3/uL (ref 4.0–10.5)
nRBC: 0 % (ref 0.0–0.2)

## 2023-03-02 LAB — CMP (CANCER CENTER ONLY)
ALT: 15 U/L (ref 0–44)
AST: 17 U/L (ref 15–41)
Albumin: 4.1 g/dL (ref 3.5–5.0)
Alkaline Phosphatase: 58 U/L (ref 38–126)
Anion gap: 4 — ABNORMAL LOW (ref 5–15)
BUN: 28 mg/dL — ABNORMAL HIGH (ref 8–23)
CO2: 27 mmol/L (ref 22–32)
Calcium: 9.1 mg/dL (ref 8.9–10.3)
Chloride: 107 mmol/L (ref 98–111)
Creatinine: 1.1 mg/dL (ref 0.61–1.24)
GFR, Estimated: >60 mL/min (ref >60)
Glucose, Bld: 103 mg/dL — ABNORMAL HIGH (ref 70–99)
Potassium: 4.7 mmol/L (ref 3.5–5.1)
Sodium: 138 mmol/L (ref 135–145)
Total Bilirubin: 0.8 mg/dL (ref 0.0–1.2)
Total Protein: 6.3 g/dL — ABNORMAL LOW (ref 6.5–8.1)

## 2023-03-02 MED ORDER — PEMBROLIZUMAB CHEMO INJECTION 100 MG/4ML
200.0000 mg | Freq: Once | INTRAVENOUS | Status: AC
  Administered 2023-03-02: 200 mg via INTRAVENOUS
  Filled 2023-03-02: qty 200

## 2023-03-02 MED ORDER — SODIUM CHLORIDE 0.9 % IV SOLN: INTRAVENOUS | Status: DC

## 2023-03-02 MED ORDER — SODIUM CHLORIDE 0.9% FLUSH
10.0000 mL | Freq: Once | INTRAVENOUS | Status: AC
  Administered 2023-03-02: 10 mL

## 2023-03-02 MED ORDER — HEPARIN SOD (PORK) LOCK FLUSH 100 UNIT/ML IV SOLN
500.0000 [IU] | Freq: Once | INTRAVENOUS | Status: AC | PRN
  Administered 2023-03-02: 500 [IU]

## 2023-03-02 MED ORDER — SODIUM CHLORIDE 0.9% FLUSH
10.0000 mL | INTRAVENOUS | Status: DC | PRN
Start: 2023-03-02 — End: 2023-03-02
  Administered 2023-03-02: 10 mL

## 2023-03-02 NOTE — Progress Notes (Signed)
Remote pacemaker transmission.   

## 2023-03-02 NOTE — Patient Instructions (Signed)

## 2023-03-02 NOTE — Addendum Note (Signed)
Addended by: Elease Etienne A on: 03/02/2023 11:15 AM   Modules accepted: Orders

## 2023-03-02 NOTE — Progress Notes (Signed)
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 He was diagnosed in October 2024.  He presented with metabolic central left upper lobe lung mass invading the mediastinum, small hypermetabolic right hilar lymph nodes, bilateral adrenal gland masses and splenic lesion consistent metastatic disease, hypermetabolic focus in the right gluteus maximus muscle.    Molecular studies by [employer] show MSI high.  Biomarker Findings Microsatellite status - MSI-High Tumor Mutational Burden - 68 Muts/Mb HRD signature - HRDsig Negative Genomic Findings For a complete list of the genes assayed, please refer to the Appendix. PALB2 F492fs*12 KRAS G12A NF1 I642fs*21 PIK3CA P17S - subclonal, H1047R? ASXL1 G663fs*58, K669fs*1 - subclonal? MSH2 loss exons 9-16 RAC1 P29H TP53 M237I 7 Disease relevant genes with no reportable alterations: ALK, BRAF, EGFR, ERBB2, MET, RET, ROS1   PDL1 98%.   PRIOR THERAPY: None   CURRENT THERAPY: Immunotherapy with Keytruda 200 mg IV every 3 weeks.  First dose on 12/29/22.  Status post 3 cycles.  INTERVAL HISTORY: ARMAN EASTRIDGE 68 y.o. male returns to the clinic today for follow-up visit accompanied by his wife.Discussed the use of AI scribe software for clinical note transcription with the patient, who gave verbal consent to proceed.  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 He was diagnosed in October 2024.  He presented with metabolic central left upper lobe lung mass invading the mediastinum, small hypermetabolic right hilar lymph nodes, bilateral adrenal gland masses and splenic lesion consistent metastatic disease, hypermetabolic focus in the right gluteus maximus muscle with PD-L1 positive  with a high expression level of 98% and MSI high. The patient was started on a regimen of Keytruda, receiving 200mg  every three weeks. He has completed three cycles of this treatment to date.  The patient reports minimal side effects from the treatment, with only transient itching and a small rash noted after the second infusion. He did not require any medication for these symptoms. There have been no reports of significant weight loss, night sweats, or other systemic symptoms. The patient notes a slight improvement in his hoarseness, although it has not resolved completely.  Recent imaging has shown a reduction in the size of the tumor in the left lung and other areas.       MEDICAL HISTORY: Past Medical History:  Diagnosis Date   Abnormal TSH    Arthritis    ddd   Atrial fibrillation and flutter (HCC)    s/p DCCV 03/26/2018   CAD in native artery    a. 11/2015: s/p 2 vessel PCI of the first diagonal branch of the LAD in the mid left circumflex with DES. // s/p CABG 01/2018   Chronic combined systolic and diastolic CHF (congestive heart failure) (HCC)    sees Dr. Johney Frame    Complete heart block (HCC)    a. s/p Medtronic ppm 10/2015.   Dyspnea    with exertion   History of radiation therapy 2002   Hodgkin disease (HCC) 2002   a. s/p chest radiation and chemotherapy   Hypertension    Hypothyroidism    sees Dr. Elvera Lennox    Incidental pulmonary nodule 01/19/2018   Left apical opacity - possible scarring   Inguinal hernia 10/14/2022   bilateral  Mitral regurgitation    s/p bioprosthetic MV replacement 01/2018   Non-small cell lung cancer (NSCLC) (HCC) 11/2022   Pacemaker 10/2015   Medtronic   PVC's (premature ventricular contractions)    Right bundle branch block    S/P CABG x 2 01/27/2018   LIMA to LAD, SVG to RCA, EVH via right thigh   S/P mitral valve replacement with bioprosthetic valve 01/27/2018   29 mm [facility] Mitral stented bovine pericardial tissue valve     ALLERGIES:  is allergic to other.  MEDICATIONS:  Current Outpatient Medications  Medication Sig Dispense Refill   albuterol (VENTOLIN HFA) 108 (90 Base) MCG/ACT inhaler Inhale 1-2 puffs into the lungs every 6 (six) hours as needed for wheezing or shortness of breath. 8 g 2   aspirin EC 81 MG tablet Take 81 mg by mouth daily.     atorvastatin (LIPITOR) 40 MG tablet Take 1 tablet (40 mg total) by mouth daily. 90 tablet 3   carvedilol (COREG) 6.25 MG tablet Take 1 tablet (6.25 mg total) by mouth 2 (two) times daily with a meal. 180 tablet 3   Cholecalciferol 125 MCG (5000 UT) TABS Take 1 tablet by mouth daily. 30 tablet 6   levothyroxine (SYNTHROID) 75 MCG tablet Take 1 tablet (75 mcg total) by mouth daily before breakfast. 90 tablet 3   lidocaine-prilocaine (EMLA) cream Apply 1 Application topically as needed. 30 g 2   meloxicam (MOBIC) 15 MG tablet Take 1 tablet (15 mg total) by mouth daily as needed. 30 tablet 1   Multiple Vitamin (MULTIVITAMIN WITH MINERALS) TABS tablet Take 1 tablet by mouth daily.     ondansetron (ZOFRAN) 8 MG tablet Take 1 tablet (8 mg total) by mouth every 8 (eight) hours as needed for nausea or vomiting. 30 tablet 2   sildenafil (VIAGRA) 50 MG tablet Take 1 tablet (50 mg total) by mouth daily as needed for erectile dysfunction. (Patient not taking: Reported on 02/09/2023) 30 tablet 3   spironolactone (ALDACTONE) 25 MG tablet Take one-half tablet (12.5 mg total) by mouth daily. 45 tablet 3   tamsulosin (FLOMAX) 0.4 MG CAPS capsule Take 1 capsule (0.4 mg total) by mouth daily. 30 capsule 1   traZODone (DESYREL) 50 MG tablet Take 1 tablet (50 mg total) by mouth at bedtime as needed for sleep. 90 tablet 1   No current facility-administered medications for this visit.    SURGICAL HISTORY:  Past Surgical History:  Procedure Laterality Date   A-FLUTTER ABLATION N/A 06/20/2021   Procedure: A-FLUTTER ABLATION;  Surgeon: Marinus Maw, MD;  Location: [facility] INVASIVE CV LAB;   Service: Cardiovascular;  Laterality: N/A;   BIV UPGRADE N/A 02/08/2019   Procedure: UPGRADE TO BIV PPM;  Surgeon: Hillis Range, MD;  Location: [facility] INVASIVE CV LAB;  Service: Cardiovascular;  Laterality: N/A;   BRONCHIAL BIOPSY  12/02/2022   Procedure: BRONCHIAL BIOPSIES;  Surgeon: Leslye Peer, MD;  Location: [facility] ENDOSCOPY;  Service: Pulmonary;;   BRONCHIAL BRUSHINGS  12/02/2022   Procedure: BRONCHIAL BRUSHINGS;  Surgeon: Leslye Peer, MD;  Location: [facility] ENDOSCOPY;  Service: Pulmonary;;   BRONCHIAL NEEDLE ASPIRATION BIOPSY  12/02/2022   Procedure: BRONCHIAL NEEDLE ASPIRATION BIOPSIES;  Surgeon: Leslye Peer, MD;  Location: [facility] ENDOSCOPY;  Service: Pulmonary;;   BRONCHIAL WASHINGS  12/02/2022   Procedure: BRONCHIAL WASHINGS;  Surgeon: Leslye Peer, MD;  Location: [facility] ENDOSCOPY;  Service: Pulmonary;;   CARDIAC CATHETERIZATION N/A 11/02/2015   Procedure: Left Heart Cath and Coronary Angiography;  Surgeon: Tonny Bollman, MD;  Location: [facility] INVASIVE CV LAB;  Service: Cardiovascular;  Laterality: N/A;   CARDIAC CATHETERIZATION N/A 11/21/2015   Procedure: Coronary Stent Intervention;  Surgeon: Tonny Bollman, MD;  Location: [facility] INVASIVE CV LAB;  Service: Cardiovascular;  Laterality: N/A;   CARDIAC CATHETERIZATION  11/2017   CARDIOVERSION N/A 03/26/2018   Procedure: CARDIOVERSION;  Surgeon: Parke Poisson, MD;  Location: [facility] ENDOSCOPY;  Service: Cardiovascular;  Laterality: N/A;   COLONOSCOPY  01/16/2015   per Dr. Christella Hartigan, benign polyps, repeat in 10 yrs    CORONARY ARTERY BYPASS GRAFT N/A 01/27/2018   Procedure: CORONARY ARTERY BYPASS GRAFTING (CABG) x two, using left internal mammary artery and right leg greater saphenous vein harvested endoscopically;  Surgeon: Purcell Nails, MD;  Location: [facility] OR;  Service: Open Heart Surgery;  Laterality: N/A;   EP IMPLANTABLE DEVICE N/A 11/02/2015   MDT Adivisa MRI conditional dual chamber pacemaker implanted by Dr Johney Frame for complete heart block    FEMUR IM NAIL Left 07/01/2022   Procedure: INTRAMEDULLARY (IM) NAIL FEMORAL LEFT ANTEGRADE;  Surgeon: Myrene Galas, MD;  Location: [facility] OR;  Service: Orthopedics;  Laterality: Left;   IR IMAGING GUIDED PORT INSERTION  01/02/2023   KNEE SURGERY Right 1972   LEAD EXTRACTION N/A 10/20/2022   Procedure: LEAD EXTRACTION;  Surgeon: Marinus Maw, MD;  Location: [facility] INVASIVE CV LAB;  Service: Cardiovascular;  Laterality: N/A;   LUMBAR LAMINECTOMY  1996   MITRAL VALVE REPLACEMENT N/A 01/27/2018   Procedure: MITRAL VALVE (MV) REPLACEMENT using Magna Mitral Ease Valve Size ;  Surgeon: Purcell Nails, MD;  Location: [facility] OR;  Service: Open Heart Surgery;  Laterality: N/A;   RIGHT/LEFT HEART CATH AND CORONARY ANGIOGRAPHY N/A 12/02/2017   Procedure: RIGHT/LEFT HEART CATH AND CORONARY ANGIOGRAPHY;  Surgeon: Corky Crafts, MD;  Location: [facility] INVASIVE CV LAB;  Service: Cardiovascular;  Laterality: N/A;   TEE WITHOUT CARDIOVERSION N/A 12/09/2017   Procedure: TRANSESOPHAGEAL ECHOCARDIOGRAM (TEE);  Surgeon: Jodelle Red, MD;  Location: [facility] ENDOSCOPY;  Service: Cardiovascular;  Laterality: N/A;   TEE WITHOUT CARDIOVERSION N/A 01/27/2018   Procedure: TRANSESOPHAGEAL ECHOCARDIOGRAM (TEE);  Surgeon: Purcell Nails, MD;  Location: [facility] OR;  Service: Open Heart Surgery;  Laterality: N/A;   VIDEO BRONCHOSCOPY WITH RADIAL ENDOBRONCHIAL ULTRASOUND  12/02/2022   Procedure: VIDEO BRONCHOSCOPY WITH RADIAL ENDOBRONCHIAL ULTRASOUND;  Surgeon: Leslye Peer, MD;  Location: [facility] ENDOSCOPY;  Service: Pulmonary;;    REVIEW OF SYSTEMS:  Constitutional: negative Eyes: negative Ears, nose, mouth, throat, and face: positive for hoarseness Respiratory: negative Cardiovascular: negative Gastrointestinal: negative Genitourinary:negative Integument/breast: negative Hematologic/lymphatic: negative Musculoskeletal:negative Neurological: negative Behavioral/Psych: negative Endocrine: negative Allergic/Immunologic:  negative   PHYSICAL EXAMINATION: General appearance: alert, cooperative, fatigued, and no distress Head: Normocephalic, without obvious abnormality, atraumatic Neck: no adenopathy, no JVD, supple, symmetrical, trachea midline, and thyroid not enlarged, symmetric, no tenderness/mass/nodules Lymph nodes: Cervical, supraclavicular, and axillary nodes normal. Resp: clear to auscultation bilaterally Back: symmetric, no curvature. ROM normal. No CVA tenderness. Cardio: regular rate and rhythm, S1, S2 normal, no murmur, click, rub or gallop GI: soft, non-tender; bowel sounds normal; no masses,  no organomegaly Extremities: extremities normal, atraumatic, no cyanosis or edema Neurologic: Alert and oriented X 3, normal strength and tone. Normal symmetric reflexes. Normal coordination and gait  ECOG PERFORMANCE STATUS: 1 - Symptomatic but completely ambulatory  Blood pressure 129/64, pulse 70, temperature 97.9 F (36.6 C), resp. rate 16, height 5\' 11"  (1.803 m), weight 183 lb 11.2 oz (83.3 kg), SpO2 100%.  LABORATORY DATA: Lab Results  Component Value Date   WBC 7.1 03/02/2023   HGB 12.0 (L) 03/02/2023   HCT 34.5 (L) 03/02/2023   MCV 92.5 03/02/2023   PLT 239 03/02/2023      Chemistry      Component Value Date/Time   NA 138 03/02/2023 0955   NA 137 06/06/2021 0919   K 4.7 03/02/2023 0955   CL 107 03/02/2023 0955   CO2 27 03/02/2023 0955   BUN 28 (H) 03/02/2023 0955   BUN 19 06/06/2021 0919   CREATININE 1.10 03/02/2023 0955   CREATININE 1.04 01/01/2016 0801      Component Value Date/Time   CALCIUM 9.1 03/02/2023 0955   ALKPHOS 58 03/02/2023 0955   AST 17 03/02/2023 0955   ALT 15 03/02/2023 0955   BILITOT 0.8 03/02/2023 0955       RADIOGRAPHIC STUDIES: CT CHEST ABDOMEN PELVIS W CONTRAST Result Date: 02/23/2023 CLINICAL DATA:  Metastatic non-small cell lung cancer restaging, ongoing immunotherapy, additional history of Hodgkin's lymphoma * Tracking Code: BO * EXAM: CT CHEST,  ABDOMEN, AND PELVIS WITH CONTRAST TECHNIQUE: Multidetector CT imaging of the chest, abdomen and pelvis was performed following the standard protocol during bolus administration of intravenous contrast. RADIATION DOSE REDUCTION: This exam was performed according to the departmental dose-optimization program which includes automated exposure control, adjustment of the mA and/or kV according to patient size and/or use of iterative reconstruction technique. CONTRAST:  30mL OMNIPAQUE IOHEXOL 300 MG/ML SOLN, OMNIPAQUE IOHEXOL 300 MG/ML SOLN additional oral enteric contrast COMPARISON:  PET-CT, 12/08/2022 FINDINGS: CT CHEST FINDINGS Cardiovascular: Right chest port catheter. Aortic atherosclerosis. Cardiomegaly. Left and right coronary artery calcifications status post median sternotomy and CABG. Mitral annular prosthesis. No pericardial effusion. Mediastinum/Nodes: Small right hilar lymph node, previously hypermetabolic not clearly appreciated on today's examination (series 2, image 32). Small benign calcified subcarinal lymph nodes (series 2, image 67). No persistently enlarged mediastinal, hilar, or axillary lymph nodes. Thyroid gland, trachea, and esophagus demonstrate no significant findings. Lungs/Pleura: Diminished size of a paramedian suprahilar left upper lobe mass measuring 2.6 x 2.2 cm, previously 4.1 x 3.2 cm with some residual soft tissue invasion of the adjacent prevascular space (series 2, image 26, series 6, image 63). New, adjacent small nodules in the dependent right lower lobe measuring 0.3 cm (series 6, image 135). New small nodule of the dependent left lung base measuring 0.3 cm (series 6, image 146). Multiple additional small calcified nodules in the left lung base, unchanged. No pleural effusion or pneumothorax. Musculoskeletal: No chest wall abnormality. No acute osseous findings. CT ABDOMEN PELVIS FINDINGS Hepatobiliary: No solid liver abnormality is seen. No gallstones, gallbladder wall  thickening, or biliary dilatation. Pancreas: Unremarkable. No pancreatic ductal dilatation or surrounding inflammatory changes. Spleen: Normal in size. No appreciable CT correlate hypermetabolic splenic metastasis identified by prior PET-CT (series 2, image 62). Adrenals/Urinary Tract: Significantly diminished size of bilateral adrenal metastases, measuring 1.9 x 1.2 cm on the left, previously 4.4 x 3.5 cm (series 2, image 70). No measurable residual right adrenal mass. Kidneys are normal, without renal calculi, solid lesion, or hydronephrosis. Bladder is unremarkable. Stomach/Bowel: Stomach is within normal limits. Appendix appears normal. No evidence of bowel wall thickening, distention, or inflammatory changes. Vascular/Lymphatic: Aortic atherosclerosis. No enlarged abdominal or pelvic lymph nodes. Reproductive: Mild prostatomegaly. Other: , Fat containing bilateral inguinal hernias.  No ascites. Musculoskeletal: No acute osseous findings. No CT correlate for previously FDG avid right gluteal or left diaphragmatic metastases (series 2, image 122, 68).  IMPRESSION: 1. Diminished size of a paramedian suprahilar left upper lobe mass with some residual soft tissue invasion of the adjacent prevascular space. 2. Significantly diminished size of bilateral adrenal metastases. 3. Small right hilar lymph node, previously hypermetabolic, not clearly appreciated on today's examination. No persistently enlarged mediastinal, hilar, or axillary lymph nodes. 4. No appreciable CT correlate for previously FDG avid splenic metastasis. 5. No CT correlate for previously FDG avid right gluteal or left diaphragmatic metastases. 6. New small nodules in the dependent right lower lobe measuring 0.3 cm and of the dependent left lung base measuring 0.3 cm, nonspecific and most likely infectious or inflammatory given distribution, although warranting attention on follow-up. 7. Cardiomegaly and coronary artery disease. Aortic Atherosclerosis  (ICD10-I70.0). Electronically Signed   By: Jearld Lesch M.D.   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 He was diagnosed in October 2024.  He presented with metabolic central left upper lobe lung mass invading the mediastinum, small hypermetabolic right hilar lymph nodes, bilateral adrenal gland masses and splenic lesion consistent metastatic disease, hypermetabolic focus in the right gluteus maximus muscle.  His molecular studies by [employer] showed MSI high and PD-L1 expression of 98%.  The patient is currently on treatment with immunotherapy with single agent Keytruda 200 Mg IV every 3 weeks status post 3 cycles. He has been tolerating this treatment fairly well. He had repeat CT scan of the chest, abdomen and pelvis performed recently.  I personally and independently reviewed the scans and discussed the result with the patient and his wife.  His scan showed significant improvement in his disease.  Non-Small Cell Lung Cancer (NSCLC) NSCLC diagnosed in October 2024 with MSI high and PD-L1 expression (98%). Currently on the fourth cycle of Pembrolizumab (Keytruda) 200 mg every three weeks. Tumor shrinkage observed in the left lung and other areas, with some areas showing better response than others. A small nodule noted on the recent scan, likely inflammation. No significant adverse effects reported from Pembrolizumab, only transient itching and rash. No recent weight loss, night sweats, or significant changes in breathing. Slight improvement in hoarseness. PET scans not routinely done unless concerning findings on CT scan. - Administer fourth cycle of Pembrolizumab 200 mg today - Schedule follow-up appointment in three weeks - Monitor the small nodule on subsequent scans  Financial Issues with Lab Bill Issues with insurance denial for [facility] testing. Insurance  Administrator) denied the claim, but the testing is standard of care and not experimental. - Connect with financial representative for assistance - Provide necessary documentation to financial representative for resolution.   The patient was advised to call immediately if he has any concerning symptoms in the interval. The patient voices understanding of current disease status and treatment options and is in agreement with the current care plan.  All questions were answered. The patient knows to call the clinic with any problems, questions or concerns. We can certainly see the patient much sooner if necessary.  The total time spent in the appointment was 30 minutes.  Disclaimer: This note was dictated with voice recognition software. Similar sounding words can inadvertently be transcribed and may not be corrected upon review.

## 2023-03-03 ENCOUNTER — Telehealth: Payer: Self-pay | Admitting: Internal Medicine

## 2023-03-03 NOTE — Telephone Encounter (Signed)
*  STAT* If patient is at the pharmacy, call can be transferred to refill team.   1. Which medications need to be refilled? (please list name of each medication and dose if known)  Entresto  24-26 MG  2. Which pharmacy/location (including street and city if local pharmacy) is medication to be sent to? [facility] [facility]  3. Do they need a 30 day or 90 day supply?  90 day supply

## 2023-03-03 NOTE — Progress Notes (Signed)
Paperwork from [employer] regarding payment for pt's molecular testing faxed to [facility] billing department for review

## 2023-03-03 NOTE — Telephone Encounter (Signed)
Patient identification verified by 2 forms. [name], RN    Called and spoke to patient  Patient states:   -Takes Entresto 24-26mg  BID per Dr. Rennis Golden   -has never been told to discontinue this medication  Informed patient:   -Per chart review Rx is not active on medication list   -will confirm with Dr. Rennis Golden if okay to refill  Patient has no further questions at this time

## 2023-03-03 NOTE — Telephone Encounter (Signed)
Pt's pharmacy is requesting a refill on entresto. This medication is no longer on pt's medication list. Does pt still suppose to be taking this medication? Please address

## 2023-03-03 NOTE — Telephone Encounter (Signed)
Called patient with no answer. Left message to call back. Need to clarify if taking Entresto.

## 2023-03-03 NOTE — Telephone Encounter (Signed)
Pt calling requesting a refill on entresto. A note was previously sent concerning this matter and still has not been address. Does pt still supposed to be taking this medication? Please addree

## 2023-03-04 ENCOUNTER — Other Ambulatory Visit (HOSPITAL_COMMUNITY): Payer: Self-pay

## 2023-03-04 MED ORDER — ENTRESTO 24-26 MG PO TABS
1.0000 | ORAL_TABLET | Freq: Two times a day (BID) | ORAL | 3 refills | Status: DC
  Filled 2023-03-04: qty 180, 90d supply, fill #0
  Filled 2023-05-11 – 2023-05-15 (×2): qty 180, 90d supply, fill #1
  Filled 2023-08-11: qty 180, 90d supply, fill #2
  Filled 2023-08-22: qty 180, 90d supply, fill #3

## 2023-03-04 NOTE — Telephone Encounter (Signed)
Chrystie Nose, MD  You2 minutes ago (1:58 PM)    Yes .Marland Kitchen He should be on Entresto 24-26 mg BID - please refill and instruct him to resume taking it if he has stopped.  Dr Rennis Golden

## 2023-03-04 NOTE — Telephone Encounter (Signed)
Patient identification verified by 2 forms. [name], RN    Called and spoke to patient  Informed patient new Rx for Entresto 24-26mg  BID send to preferred pharmacy  Patient verbalized understanding, no questions at this time

## 2023-03-09 ENCOUNTER — Encounter: Payer: Self-pay | Admitting: Family Medicine

## 2023-03-09 ENCOUNTER — Ambulatory Visit: Payer: 59 | Admitting: Internal Medicine

## 2023-03-09 ENCOUNTER — Ambulatory Visit: Payer: 59

## 2023-03-09 ENCOUNTER — Other Ambulatory Visit (HOSPITAL_BASED_OUTPATIENT_CLINIC_OR_DEPARTMENT_OTHER): Payer: Self-pay

## 2023-03-09 ENCOUNTER — Ambulatory Visit: Payer: 59 | Admitting: Family Medicine

## 2023-03-09 ENCOUNTER — Other Ambulatory Visit: Payer: Self-pay

## 2023-03-09 ENCOUNTER — Other Ambulatory Visit: Payer: 59

## 2023-03-09 ENCOUNTER — Ambulatory Visit: Payer: Self-pay | Admitting: Family Medicine

## 2023-03-09 VITALS — BP 110/68 | HR 78 | Temp 98.5°F | Wt 180.0 lb

## 2023-03-09 DIAGNOSIS — J40 Bronchitis, not specified as acute or chronic: Secondary | ICD-10-CM

## 2023-03-09 DIAGNOSIS — R0989 Other specified symptoms and signs involving the circulatory and respiratory systems: Secondary | ICD-10-CM

## 2023-03-09 LAB — POC COVID19 BINAXNOW: SARS Coronavirus 2 Ag: NEGATIVE

## 2023-03-09 LAB — POCT INFLUENZA A/B
Influenza A, POC: NEGATIVE
Influenza B, POC: NEGATIVE

## 2023-03-09 MED ORDER — AZITHROMYCIN 250 MG PO TABS
ORAL_TABLET | ORAL | 0 refills | Status: DC
  Filled 2023-03-09: qty 6, 5d supply, fill #0

## 2023-03-09 NOTE — Telephone Encounter (Signed)
   Chief Complaint: productive cough  Symptoms: thick yellow-green Frequency: worse in morning  Disposition: [] ED /[] Urgent Care (no appt availability in office) / [x] Appointment(In office/virtual)/ []  [facility]/ [] Home Care/ [] Refused Recommended Disposition /[] [facility]/ []  Follow-up with PCP Additional Notes: Pt complaining of productive cough for a few days now. Mucus is yellowish-green and thick. Pt states it is worse in the mornings. Pt has had sick kids in house and feels they shared the germs.Pt has stage 4 lung Ca. Pt states he always has some SOB, but this hasn't made it worse. Pt denies fever. Per protocol, pt to be seen today. Pt has appt with provider at 1300. RN gave care advice and pt verbalized understanding.        Copied from CRM [id number]. Topic: Clinical - Red Word Triage >> Mar 09, 2023  8:42 AM Pascal Lux wrote: Red Word that prompted transfer to Nurse Triage: Patient stated CT scanned showed he got a lung infection in his chest and green mucus coming out. Reason for Disposition  [1] MILD difficulty breathing (e.g., minimal/no SOB at rest, SOB with walking, pulse <100) AND [2] still present when not coughing  Answer Assessment - Initial Assessment Questions 1. ONSET: "When did the cough begin?"      A few days ago  2. SEVERITY: "How bad is the cough today?"      Producing mucus  3. SPUTUM: "Describe the color of your sputum" (none, dry cough; clear, white, yellow, green)     Green- yellow  4. HEMOPTYSIS: "Are you coughing up any blood?" If so ask: "How much?" (flecks, streaks, tablespoons, etc.)     Denies  5. DIFFICULTY BREATHING: "Are you having difficulty breathing?" If Yes, ask: "How bad is it?" (e.g., mild, moderate, severe)    - MILD: No SOB at rest, mild SOB with walking, speaks normally in sentences, can lie down, no retractions, pulse < 100.    - MODERATE: SOB at rest, SOB with minimal exertion and prefers to sit, cannot lie down flat,  speaks in phrases, mild retractions, audible wheezing, pulse 100-120.    - SEVERE: Very SOB at rest, speaks in single words, struggling to breathe, sitting hunched forward, retractions, pulse > 120      Lung cancer so SOB 6. FEVER: "Do you have a fever?" If Yes, ask: "What is your temperature, how was it measured, and when did it start?"     Denies  7. CARDIAC HISTORY: "Do you have any history of heart disease?" (e.g., heart attack, congestive heart failure)   Pacemaker  8. LUNG HISTORY: "Do you have any history of lung disease?"  (e.g., pulmonary embolus, asthma, emphysema)     Lung CA 9. PE RISK FACTORS: "Do you have a history of blood clots?" (or: recent major surgery, recent prolonged travel, bedridden)     Denies  10. OTHER SYMPTOMS: "Do you have any other symptoms?" (e.g., runny nose, wheezing, chest pain)       Runny nose  Protocols used: Cough - Acute Productive-A-AH

## 2023-03-09 NOTE — Progress Notes (Signed)
   Subjective:    Patient ID: Tyler Hendrix, male    DOB: 04-Jun-1955, 68 y.o.   MRN: [id number]  HPI Here for 2 weeks of stuffy head, PND, ST, chest congestion, and coughing up green sputum. No fever or SOB. Taking Dayquil and Nyquil.    Review of Systems  Constitutional: Negative.   HENT:  Positive for congestion, postnasal drip and sore throat. Negative for ear pain and sinus pain.   Eyes: Negative.   Respiratory:  Positive for cough. Negative for shortness of breath and wheezing.        Objective:   Physical Exam Constitutional:      Appearance: Normal appearance.  HENT:     Right Ear: Tympanic membrane, ear canal and external ear normal.     Left Ear: Tympanic membrane, ear canal and external ear normal.     Nose: Nose normal.     Mouth/Throat:     Pharynx: Oropharynx is clear.     Comments: Voice is hoarse  Eyes:     Conjunctiva/sclera: Conjunctivae normal.  Pulmonary:     Effort: Pulmonary effort is normal.     Breath sounds: Normal breath sounds.  Lymphadenopathy:     Cervical: No cervical adenopathy.  Neurological:     Mental Status: He is alert.           Assessment & Plan:  Bronchitis. Treat with a Zpack. Gershon Crane, MD

## 2023-03-09 NOTE — Addendum Note (Signed)
Addended by: Carola Rhine on: 03/09/2023 04:48 PM   Modules accepted: Orders

## 2023-03-09 NOTE — Telephone Encounter (Signed)
Pt was seen today.

## 2023-03-17 NOTE — Progress Notes (Signed)
 [facility] OFFICE PROGRESS NOTE  Tyler Furth, MD [address] 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 He was diagnosed in October 2024.  He presented with metabolic central left upper lobe lung mass invading the mediastinum, small hypermetabolic right hilar lymph nodes, bilateral adrenal gland masses and splenic lesion consistent metastatic disease, hypermetabolic focus in the right gluteus maximus muscle.    Molecular studies by [employer] show Tyler Hendrix.    [employer] requested on 12/22/22  PDL1: 98%  PRIOR THERAPY: None  CURRENT THERAPY: Immunotherapy with Keytruda  200 mg IV every 3 weeks.  First dose expected on 12/29/22. Status post 4 cycles.   INTERVAL HISTORY: DARL FRUEHAUF 68 y.o. male returns to the clinic today for a follow-up visit accompanied by his wife.  The patient is a never smoker was recently found to have metastatic cancer, felt to be poorly differentiated non-small cell lung cancer. He was found to have Tyler Hendrix. Therefore, he started on treatment with immunotherapy with Keytruda . He has been tolerating treatment well.   In the interval since being seen, he was seen by his PCP and treated for bronchitis. He is feeling much better at this time.   Denies any fever, chills, or night sweats. His weight is stable.  He reports a good appetite. Today, he states his breathing is "fine". He states he no longer has a cough. He denies any chest pain.  He denies any hemoptysis.  He denies any nausea, vomiting, diarrhea, or constipation. Denies any headaches. His potassium ended up being a little Hendrix today. He has been eating a lot of bananas and had 3 in the last 12 hours. He is here for evaluation and repeat blood work before undergoing cycle #5.   MEDICAL HISTORY: Past Medical History:  Diagnosis Date   Abnormal TSH    Arthritis    ddd   Atrial fibrillation and flutter (HCC)    s/p  DCCV 03/26/2018   CAD in native artery    a. 11/2015: s/p 2 vessel PCI of the first diagonal branch of the LAD in the mid left circumflex with DES. // s/p CABG 01/2018   Chronic combined systolic and diastolic CHF (congestive heart failure) (HCC)    sees Dr. Nunzio Belch    Complete heart block (HCC)    a. s/p Medtronic ppm 10/2015.   Dyspnea    with exertion   History of radiation therapy 2002   Hodgkin disease (HCC) 2002   a. s/p chest radiation and chemotherapy   Hypertension    Hypothyroidism    sees Dr. Aldona Amel    Incidental pulmonary nodule 01/19/2018   Left apical opacity - possible scarring   Inguinal hernia 10/14/2022   bilateral   Mitral regurgitation    s/p bioprosthetic MV replacement 01/2018   Non-small cell lung cancer (NSCLC) (HCC) 11/2022   Pacemaker 10/2015   Medtronic   PVC's (premature ventricular contractions)    Right bundle branch block    S/P CABG x 2 01/27/2018   LIMA to LAD, SVG to RCA, EVH via right thigh   S/P mitral valve replacement with bioprosthetic valve 01/27/2018   29 mm [facility] Mitral stented bovine pericardial tissue valve    ALLERGIES:  is allergic to other.  MEDICATIONS:  Current Outpatient Medications  Medication Sig Dispense Refill   albuterol  (VENTOLIN  HFA) 108 (90 Base) MCG/ACT inhaler Inhale 1-2 puffs into the lungs every 6 (  six) hours as needed for wheezing or shortness of breath. 8 g 2   aspirin  EC 81 MG tablet Take 81 mg by mouth daily.     atorvastatin  (LIPITOR ) 40 MG tablet Take 1 tablet (40 mg total) by mouth daily. 90 tablet 3   azithromycin  (ZITHROMAX  Z-PAK) 250 MG tablet Take as directed on packaging. 6 each 0   carvedilol  (COREG ) 6.25 MG tablet Take 1 tablet (6.25 mg total) by mouth 2 (two) times daily with a meal. 180 tablet 3   Cholecalciferol  125 MCG (5000 UT) TABS Take 1 tablet by mouth daily. 30 tablet 6   levothyroxine  (SYNTHROID ) 75 MCG tablet Take 1 tablet (75 mcg total) by mouth daily before breakfast. 90 tablet 3    lidocaine -prilocaine  (EMLA ) cream Apply 1 Application topically as needed. 30 g 2   meloxicam  (MOBIC ) 15 MG tablet Take 1 tablet (15 mg total) by mouth daily as needed. 30 tablet 1   Multiple Vitamin (MULTIVITAMIN WITH MINERALS) TABS tablet Take 1 tablet by mouth daily.     sacubitril -valsartan  (ENTRESTO ) 24-26 MG Take 1 tablet by mouth 2 (two) times daily. 180 tablet 3   sildenafil  (VIAGRA ) 50 MG tablet Take 1 tablet (50 mg total) by mouth daily as needed for erectile dysfunction. 30 tablet 3   spironolactone  (ALDACTONE ) 25 MG tablet Take one-half tablet (12.5 mg total) by mouth daily. 45 tablet 3   tamsulosin  (FLOMAX ) 0.4 MG CAPS capsule Take 1 capsule (0.4 mg total) by mouth daily. 30 capsule 1   traZODone  (DESYREL ) 50 MG tablet Take 1 tablet (50 mg total) by mouth at bedtime as needed for sleep. 90 tablet 1   No current facility-administered medications for this visit.    SURGICAL HISTORY:  Past Surgical History:  Procedure Laterality Date   A-FLUTTER ABLATION N/A 06/20/2021   Procedure: A-FLUTTER ABLATION;  Surgeon: Tammie Fall, MD;  Location: [facility] INVASIVE CV LAB;  Service: Cardiovascular;  Laterality: N/A;   BIV UPGRADE N/A 02/08/2019   Procedure: UPGRADE TO BIV PPM;  Surgeon: Jolly Needle, MD;  Location: [facility] INVASIVE CV LAB;  Service: Cardiovascular;  Laterality: N/A;   BRONCHIAL BIOPSY  12/02/2022   Procedure: BRONCHIAL BIOPSIES;  Surgeon: Denson Flake, MD;  Location: [facility] ENDOSCOPY;  Service: Pulmonary;;   BRONCHIAL BRUSHINGS  12/02/2022   Procedure: BRONCHIAL BRUSHINGS;  Surgeon: Denson Flake, MD;  Location: [facility] ENDOSCOPY;  Service: Pulmonary;;   BRONCHIAL NEEDLE ASPIRATION BIOPSY  12/02/2022   Procedure: BRONCHIAL NEEDLE ASPIRATION BIOPSIES;  Surgeon: Denson Flake, MD;  Location: [facility] ENDOSCOPY;  Service: Pulmonary;;   BRONCHIAL WASHINGS  12/02/2022   Procedure: BRONCHIAL WASHINGS;  Surgeon: Denson Flake, MD;  Location: [facility] ENDOSCOPY;  Service: Pulmonary;;   CARDIAC  CATHETERIZATION N/A 11/02/2015   Procedure: Left Heart Cath and Coronary Angiography;  Surgeon: Arnoldo Lapping, MD;  Location: [facility] INVASIVE CV LAB;  Service: Cardiovascular;  Laterality: N/A;   CARDIAC CATHETERIZATION N/A 11/21/2015   Procedure: Coronary Stent Intervention;  Surgeon: Arnoldo Lapping, MD;  Location: [facility] INVASIVE CV LAB;  Service: Cardiovascular;  Laterality: N/A;   CARDIAC CATHETERIZATION  11/2017   CARDIOVERSION N/A 03/26/2018   Procedure: CARDIOVERSION;  Surgeon: Euell Herrlich, MD;  Location: [facility] ENDOSCOPY;  Service: Cardiovascular;  Laterality: N/A;   COLONOSCOPY  01/16/2015   per Dr. Howard Macho, benign polyps, repeat in 10 yrs    CORONARY ARTERY BYPASS GRAFT N/A 01/27/2018   Procedure: CORONARY ARTERY BYPASS GRAFTING (CABG) x two, using left internal mammary artery and right  leg greater saphenous vein harvested endoscopically;  Surgeon: Gardenia Jump, MD;  Location: [facility] OR;  Service: Open Heart Surgery;  Laterality: N/A;   EP IMPLANTABLE DEVICE N/A 11/02/2015   MDT Adivisa MRI conditional dual chamber pacemaker implanted by Dr Nunzio Belch for complete heart block   FEMUR IM NAIL Left 07/01/2022   Procedure: INTRAMEDULLARY (IM) NAIL FEMORAL LEFT ANTEGRADE;  Surgeon: Hardy Lia, MD;  Location: [facility] OR;  Service: Orthopedics;  Laterality: Left;   IR IMAGING GUIDED PORT INSERTION  01/02/2023   KNEE SURGERY Right 1972   LEAD EXTRACTION N/A 10/20/2022   Procedure: LEAD EXTRACTION;  Surgeon: Tammie Fall, MD;  Location: [facility] INVASIVE CV LAB;  Service: Cardiovascular;  Laterality: N/A;   LUMBAR LAMINECTOMY  1996   MITRAL VALVE REPLACEMENT N/A 01/27/2018   Procedure: MITRAL VALVE (MV) REPLACEMENT using Magna Mitral Ease Valve Size ;  Surgeon: Gardenia Jump, MD;  Location: [facility] OR;  Service: Open Heart Surgery;  Laterality: N/A;   RIGHT/LEFT HEART CATH AND CORONARY ANGIOGRAPHY N/A 12/02/2017   Procedure: RIGHT/LEFT HEART CATH AND CORONARY ANGIOGRAPHY;  Surgeon: Lucendia Rusk, MD;   Location: [facility] INVASIVE CV LAB;  Service: Cardiovascular;  Laterality: N/A;   TEE WITHOUT CARDIOVERSION N/A 12/09/2017   Procedure: TRANSESOPHAGEAL ECHOCARDIOGRAM (TEE);  Surgeon: Sheryle Donning, MD;  Location: [facility] ENDOSCOPY;  Service: Cardiovascular;  Laterality: N/A;   TEE WITHOUT CARDIOVERSION N/A 01/27/2018   Procedure: TRANSESOPHAGEAL ECHOCARDIOGRAM (TEE);  Surgeon: Gardenia Jump, MD;  Location: [facility] OR;  Service: Open Heart Surgery;  Laterality: N/A;   VIDEO BRONCHOSCOPY WITH RADIAL ENDOBRONCHIAL ULTRASOUND  12/02/2022   Procedure: VIDEO BRONCHOSCOPY WITH RADIAL ENDOBRONCHIAL ULTRASOUND;  Surgeon: Denson Flake, MD;  Location: [facility] ENDOSCOPY;  Service: Pulmonary;;    REVIEW OF SYSTEMS:   Review of Systems  Constitutional: Negative for appetite change, chills, fatigue, fever and unexpected weight change.  HENT: Negative for mouth sores, nosebleeds, sore throat and trouble swallowing.   Eyes: Negative for eye problems and icterus.  Respiratory: Negative for cough, hemoptysis, shortness of breath and wheezing.   Cardiovascular: Negative for chest pain and leg swelling.  Gastrointestinal: Negative for abdominal pain, constipation, diarrhea, nausea and vomiting.  Genitourinary: Negative for bladder incontinence, difficulty urinating, dysuria, frequency and hematuria.   Musculoskeletal: Negative for back pain, gait problem, neck pain and neck stiffness.  Skin: Negative for itching and rash.  Neurological: Negative for dizziness, extremity weakness, gait problem, headaches, light-headedness and seizures.  Hematological: Negative for adenopathy. Does not bruise/bleed easily.  Psychiatric/Behavioral: Negative for confusion, depression and sleep disturbance. The patient is not nervous/anxious.     PHYSICAL EXAMINATION:  Blood pressure 121/75, pulse 80, temperature 98 F (36.7 C), temperature source Temporal, resp. rate 16, weight 181 lb 9.6 oz (82.4 kg), SpO2 100%.  ECOG PERFORMANCE  STATUS: 1  Physical Exam  Constitutional: Oriented to person, place, and time and well-developed, well-nourished, and in no distress.  HENT:  Head: Normocephalic and atraumatic.  Mouth/Throat: Oropharynx is clear and moist. No oropharyngeal exudate.  Eyes: Conjunctivae are normal. Right eye exhibits no discharge. Left eye exhibits no discharge. No scleral icterus.  Neck: Normal range of motion. Neck supple.  Cardiovascular: Normal rate, regular rhythm, normal heart sounds and intact distal pulses.   Pulmonary/Chest: Effort normal and breath sounds normal. No respiratory distress. No wheezes. No rales.  Abdominal: Soft. Bowel sounds are normal. Exhibits no distension and no mass. There is no tenderness.  Musculoskeletal: Normal range of motion. Exhibits no edema.  Lymphadenopathy:  No cervical adenopathy.  Neurological: Alert and oriented to person, place, and time. Exhibits normal muscle tone. Gait normal. Coordination normal.  Skin: Skin is warm and dry. No rash noted. Not diaphoretic. No erythema. No pallor.  Psychiatric: Mood, memory and judgment normal.  Vitals reviewed.  LABORATORY DATA: Lab Results  Component Value Date   WBC 7.2 03/23/2023   HGB 12.2 (L) 03/23/2023   HCT 35.5 (L) 03/23/2023   MCV 96.2 03/23/2023   PLT 237 03/23/2023      Chemistry      Component Value Date/Time   NA 138 03/02/2023 0955   NA 137 06/06/2021 0919   K 4.7 03/02/2023 0955   CL 107 03/02/2023 0955   CO2 27 03/02/2023 0955   BUN 28 (H) 03/02/2023 0955   BUN 19 06/06/2021 0919   CREATININE 1.10 03/02/2023 0955   CREATININE 1.04 01/01/2016 0801      Component Value Date/Time   CALCIUM  9.1 03/02/2023 0955   ALKPHOS 58 03/02/2023 0955   AST 17 03/02/2023 0955   ALT 15 03/02/2023 0955   BILITOT 0.8 03/02/2023 0955       RADIOGRAPHIC STUDIES:  CT CHEST ABDOMEN PELVIS W CONTRAST Result Date: 02/23/2023 CLINICAL DATA:  Metastatic non-small cell lung cancer restaging, ongoing  immunotherapy, additional history of Hodgkin's lymphoma * Tracking Code: BO * EXAM: CT CHEST, ABDOMEN, AND PELVIS WITH CONTRAST TECHNIQUE: Multidetector CT imaging of the chest, abdomen and pelvis was performed following the standard protocol during bolus administration of intravenous contrast. RADIATION DOSE REDUCTION: This exam was performed according to the departmental dose-optimization program which includes automated exposure control, adjustment of the mA and/or kV according to patient size and/or use of iterative reconstruction technique. CONTRAST:  30mL OMNIPAQUE  IOHEXOL  300 MG/ML SOLN, OMNIPAQUE  IOHEXOL  300 MG/ML SOLN additional oral enteric contrast COMPARISON:  PET-CT, 12/08/2022 FINDINGS: CT CHEST FINDINGS Cardiovascular: Right chest port catheter. Aortic atherosclerosis. Cardiomegaly. Left and right coronary artery calcifications status post median sternotomy and CABG. Mitral annular prosthesis. No pericardial effusion. Mediastinum/Nodes: Small right hilar lymph node, previously hypermetabolic not clearly appreciated on today's examination (series 2, image 32). Small benign calcified subcarinal lymph nodes (series 2, image 67). No persistently enlarged mediastinal, hilar, or axillary lymph nodes. Thyroid  gland, trachea, and esophagus demonstrate no significant findings. Lungs/Pleura: Diminished size of a paramedian suprahilar left upper lobe mass measuring 2.6 x 2.2 cm, previously 4.1 x 3.2 cm with some residual soft tissue invasion of the adjacent prevascular space (series 2, image 26, series 6, image 63). New, adjacent small nodules in the dependent right lower lobe measuring 0.3 cm (series 6, image 135). New small nodule of the dependent left lung base measuring 0.3 cm (series 6, image 146). Multiple additional small calcified nodules in the left lung base, unchanged. No pleural effusion or pneumothorax. Musculoskeletal: No chest wall abnormality. No acute osseous findings. CT ABDOMEN PELVIS  FINDINGS Hepatobiliary: No solid liver abnormality is seen. No gallstones, gallbladder wall thickening, or biliary dilatation. Pancreas: Unremarkable. No pancreatic ductal dilatation or surrounding inflammatory changes. Spleen: Normal in size. No appreciable CT correlate hypermetabolic splenic metastasis identified by prior PET-CT (series 2, image 62). Adrenals/Urinary Tract: Significantly diminished size of bilateral adrenal metastases, measuring 1.9 x 1.2 cm on the left, previously 4.4 x 3.5 cm (series 2, image 70). No measurable residual right adrenal mass. Kidneys are normal, without renal calculi, solid lesion, or hydronephrosis. Bladder is unremarkable. Stomach/Bowel: Stomach is within normal limits. Appendix appears normal. No evidence of bowel wall thickening, distention,  or inflammatory changes. Vascular/Lymphatic: Aortic atherosclerosis. No enlarged abdominal or pelvic lymph nodes. Reproductive: Mild prostatomegaly. Other: , Fat containing bilateral inguinal hernias.  No ascites. Musculoskeletal: No acute osseous findings. No CT correlate for previously FDG avid right gluteal or left diaphragmatic metastases (series 2, image 122, 68). IMPRESSION: 1. Diminished size of a paramedian suprahilar left upper lobe mass with some residual soft tissue invasion of the adjacent prevascular space. 2. Significantly diminished size of bilateral adrenal metastases. 3. Small right hilar lymph node, previously hypermetabolic, not clearly appreciated on today's examination. No persistently enlarged mediastinal, hilar, or axillary lymph nodes. 4. No appreciable CT correlate for previously FDG avid splenic metastasis. 5. No CT correlate for previously FDG avid right gluteal or left diaphragmatic metastases. 6. New small nodules in the dependent right lower lobe measuring 0.3 cm and of the dependent left lung base measuring 0.3 cm, nonspecific and most likely infectious or inflammatory given distribution, although warranting  attention on follow-up. 7. Cardiomegaly and coronary artery disease. Aortic Atherosclerosis (ICD10-I70.0). Electronically Signed   By: Fredricka Jenny M.D.   On: 02/23/2023 17:31     ASSESSMENT/PLAN:  This is a very pleasant 68 year old never smoker Caucasian male with stage IV lung cancer, poorly differentiated carcinoma, felt to be non-small cell lung cancer. He was diagnosed in October 2024.  He presented with metabolic central left upper lobe lung mass invading the mediastinum, small hypermetabolic right hilar lymph nodes, bilateral adrenal gland masses and splenic lesion consistent metastatic disease, hypermetabolic focus in the right gluteus maximus muscle.  His molecular studies show he is Tyler Hendrix    He is currently undergoing palliative immunotherapy with Keytruda  200 mg IV every 3 weeks. He is status post 4 cycles and tolerates it well.    Labs were reviewed. Recommend that he proceed with cycle #5 today as scheduled.   He was advised to avoid excessive potassium rich food. He is having his labs checked at his urologist office tomorrow. They likely are repeating a CMP but, just in the event they were not, we will give him a lab requisition form for BMP. He is on spironolactone . However, he has been on this for sometime and his hyperkalemia seems to be associated with his increase intake of potassium rich food and it is typically normal. If this continues to be elevated, then will reach out to PCP about consideration about adjustments of his spironolactone .    We will see him back in 3 weeks for evaluation and repeat blood work before undergoing cycle #6   Patient also has a history of lymphoma and he did have radiation to the chest for Hodgkin's lymphoma in 2002 and chemotherapy.  He was previously followed by Dr. Maria Shiner.    The patient was advised to call immediately if he has any concerning symptoms in the interval. The patient voices understanding of current disease status and  treatment options and is in agreement with the current care plan. All questions were answered. The patient knows to call the clinic with any problems, questions or concerns. We can certainly see the patient much sooner if necessary  No orders of the defined types were placed in this encounter.    The total time spent in the appointment was 20-29 minutes  Leni Pankonin L Vicky Mccanless, PA-C 03/23/23

## 2023-03-23 ENCOUNTER — Inpatient Hospital Stay: Payer: 59 | Attending: Physician Assistant

## 2023-03-23 ENCOUNTER — Inpatient Hospital Stay (HOSPITAL_BASED_OUTPATIENT_CLINIC_OR_DEPARTMENT_OTHER): Payer: 59 | Attending: Physician Assistant | Admitting: Physician Assistant

## 2023-03-23 ENCOUNTER — Inpatient Hospital Stay: Payer: 59

## 2023-03-23 VITALS — BP 121/75 | HR 80 | Temp 98.0°F | Resp 16 | Wt 181.6 lb

## 2023-03-23 DIAGNOSIS — Z8571 Personal history of Hodgkin lymphoma: Secondary | ICD-10-CM | POA: Insufficient documentation

## 2023-03-23 DIAGNOSIS — C7971 Secondary malignant neoplasm of right adrenal gland: Secondary | ICD-10-CM | POA: Insufficient documentation

## 2023-03-23 DIAGNOSIS — Z5112 Encounter for antineoplastic immunotherapy: Secondary | ICD-10-CM | POA: Diagnosis not present

## 2023-03-23 DIAGNOSIS — Z7982 Long term (current) use of aspirin: Secondary | ICD-10-CM | POA: Diagnosis not present

## 2023-03-23 DIAGNOSIS — C3412 Malignant neoplasm of upper lobe, left bronchus or lung: Secondary | ICD-10-CM | POA: Insufficient documentation

## 2023-03-23 DIAGNOSIS — C771 Secondary and unspecified malignant neoplasm of intrathoracic lymph nodes: Secondary | ICD-10-CM | POA: Insufficient documentation

## 2023-03-23 DIAGNOSIS — C781 Secondary malignant neoplasm of mediastinum: Secondary | ICD-10-CM | POA: Insufficient documentation

## 2023-03-23 DIAGNOSIS — Z79899 Other long term (current) drug therapy: Secondary | ICD-10-CM | POA: Insufficient documentation

## 2023-03-23 DIAGNOSIS — C7972 Secondary malignant neoplasm of left adrenal gland: Secondary | ICD-10-CM | POA: Insufficient documentation

## 2023-03-23 DIAGNOSIS — C3492 Malignant neoplasm of unspecified part of left bronchus or lung: Secondary | ICD-10-CM

## 2023-03-23 DIAGNOSIS — Z95828 Presence of other vascular implants and grafts: Secondary | ICD-10-CM

## 2023-03-23 LAB — CBC WITH DIFFERENTIAL (CANCER CENTER ONLY)
Abs Immature Granulocytes: 0.02 10*3/uL (ref 0.00–0.07)
Basophils Absolute: 0.1 10*3/uL (ref 0.0–0.1)
Basophils Relative: 1 %
Eosinophils Absolute: 0.2 10*3/uL (ref 0.0–0.5)
Eosinophils Relative: 3 %
HCT: 35.5 % — ABNORMAL LOW (ref 39.0–52.0)
Hemoglobin: 12.2 g/dL — ABNORMAL LOW (ref 13.0–17.0)
Immature Granulocytes: 0 %
Lymphocytes Relative: 15 %
Lymphs Abs: 1.1 10*3/uL (ref 0.7–4.0)
MCH: 33.1 pg (ref 26.0–34.0)
MCHC: 34.4 g/dL (ref 30.0–36.0)
MCV: 96.2 fL (ref 80.0–100.0)
Monocytes Absolute: 0.4 10*3/uL (ref 0.1–1.0)
Monocytes Relative: 6 %
Neutro Abs: 5.4 10*3/uL (ref 1.7–7.7)
Neutrophils Relative %: 75 %
Platelet Count: 237 10*3/uL (ref 150–400)
RBC: 3.69 MIL/uL — ABNORMAL LOW (ref 4.22–5.81)
RDW: 13.2 % (ref 11.5–15.5)
WBC Count: 7.2 10*3/uL (ref 4.0–10.5)
nRBC: 0 % (ref 0.0–0.2)

## 2023-03-23 LAB — CMP (CANCER CENTER ONLY)
ALT: 18 U/L (ref 0–44)
AST: 21 U/L (ref 15–41)
Albumin: 4.3 g/dL (ref 3.5–5.0)
Alkaline Phosphatase: 66 U/L (ref 38–126)
Anion gap: 5 (ref 5–15)
BUN: 26 mg/dL — ABNORMAL HIGH (ref 8–23)
CO2: 26 mmol/L (ref 22–32)
Calcium: 9 mg/dL (ref 8.9–10.3)
Chloride: 105 mmol/L (ref 98–111)
Creatinine: 1.12 mg/dL (ref 0.61–1.24)
GFR, Estimated: >60 mL/min (ref >60)
Glucose, Bld: 93 mg/dL (ref 70–99)
Potassium: 5.4 mmol/L — ABNORMAL HIGH (ref 3.5–5.1)
Sodium: 136 mmol/L (ref 135–145)
Total Bilirubin: 0.7 mg/dL (ref 0.0–1.2)
Total Protein: 6.4 g/dL — ABNORMAL LOW (ref 6.5–8.1)

## 2023-03-23 MED ORDER — HEPARIN SOD (PORK) LOCK FLUSH 100 UNIT/ML IV SOLN
500.0000 [IU] | Freq: Once | INTRAVENOUS | Status: AC | PRN
  Administered 2023-03-23: 500 [IU]

## 2023-03-23 MED ORDER — SODIUM CHLORIDE 0.9% FLUSH
10.0000 mL | INTRAVENOUS | Status: DC | PRN
  Administered 2023-03-23: 10 mL

## 2023-03-23 MED ORDER — SODIUM CHLORIDE 0.9% FLUSH
10.0000 mL | Freq: Once | INTRAVENOUS | Status: AC
  Administered 2023-03-23: 10 mL

## 2023-03-23 MED ORDER — SODIUM CHLORIDE 0.9 % IV SOLN: INTRAVENOUS | Status: DC

## 2023-03-23 MED ORDER — SODIUM CHLORIDE 0.9 % IV SOLN
200.0000 mg | Freq: Once | INTRAVENOUS | Status: AC
  Administered 2023-03-23: 200 mg via INTRAVENOUS
  Filled 2023-03-23: qty 200

## 2023-03-23 NOTE — Patient Instructions (Signed)

## 2023-03-24 DIAGNOSIS — R3912 Poor urinary stream: Secondary | ICD-10-CM | POA: Diagnosis not present

## 2023-03-24 DIAGNOSIS — N401 Enlarged prostate with lower urinary tract symptoms: Secondary | ICD-10-CM | POA: Diagnosis not present

## 2023-03-30 ENCOUNTER — Other Ambulatory Visit (HOSPITAL_COMMUNITY): Payer: Self-pay

## 2023-03-30 ENCOUNTER — Other Ambulatory Visit: Payer: Self-pay

## 2023-03-30 DIAGNOSIS — R972 Elevated prostate specific antigen [PSA]: Secondary | ICD-10-CM | POA: Diagnosis not present

## 2023-03-30 DIAGNOSIS — R3912 Poor urinary stream: Secondary | ICD-10-CM | POA: Diagnosis not present

## 2023-03-30 MED ORDER — TAMSULOSIN HCL 0.4 MG PO CAPS
0.4000 mg | ORAL_CAPSULE | Freq: Every day | ORAL | 3 refills | Status: DC
  Filled 2023-03-30: qty 90, 90d supply, fill #1
  Filled 2023-03-30: qty 90, 90d supply, fill #0
  Filled 2023-07-01: qty 90, 90d supply, fill #1
  Filled 2023-08-11 – 2023-09-24 (×3): qty 90, 90d supply, fill #2
  Filled 2023-11-10 – 2023-12-12 (×2): qty 90, 90d supply, fill #3

## 2023-03-31 ENCOUNTER — Other Ambulatory Visit: Payer: Self-pay

## 2023-04-07 ENCOUNTER — Encounter: Payer: Self-pay | Admitting: Urology

## 2023-04-07 NOTE — Progress Notes (Signed)
 [facility] OFFICE PROGRESS NOTE  Tyler Salisbury, MD [address] 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 He was diagnosed in October 2024.  He presented with metabolic central left upper lobe lung mass invading the mediastinum, small hypermetabolic right hilar lymph nodes, bilateral adrenal gland masses and splenic lesion consistent metastatic disease, hypermetabolic focus in the right gluteus maximus muscle.    Molecular studies by [employer] show MSI high.    Foundation One requested on 12/22/22   PDL1: 98%  PRIOR THERAPY: None  CURRENT THERAPY: Immunotherapy with Keytruda 200 mg IV every 3 weeks.  First dose expected on 12/29/22. Status post 6 cycles.   INTERVAL HISTORY: Tyler Hendrix 68 y.o. male returns to the clinic today for a follow-up visit accompanied by his wife.  The patient is a never smoker was recently found to have metastatic cancer, felt to be poorly differentiated non-small cell lung cancer. He was found to have MSI high. Therefore, he started on treatment with immunotherapy with Keytruda. He has been tolerating treatment well. He saw his urologist in the interval to monitor his PSA. His last PSA was in the ~5 range. His recheck PSA was in the ~4's. As of now, he is scheduled for follow up every 6 months. They also received a letter in the mail notifying him that he is enrolled in drug assistance for his treatment. They brought a copy to the clinic today to scan in the chart.   Denies any fever, chills, or night sweats. His weight is stable.  He reports a good appetite. Today, he states his breathing is "fine". He denies unusual cough. He denies any chest pain.  He denies any hemoptysis.  He denies any nausea, vomiting, diarrhea, or constipation. Denies any headaches. He continues to have itching but no rashes. His potassium was a little high at his last appointment due to eating a lot of  bananas. His potassium is normal today. He is here for evaluation and repeat blood work before undergoing cycle #6.     MEDICAL HISTORY: Past Medical History:  Diagnosis Date   Abnormal TSH    Arthritis    ddd   Atrial fibrillation and flutter (HCC)    s/p DCCV 03/26/2018   CAD in native artery    a. 11/2015: s/p 2 vessel PCI of the first diagonal branch of the LAD in the mid left circumflex with DES. // s/p CABG 01/2018   Chronic combined systolic and diastolic CHF (congestive heart failure) (HCC)    sees Dr. Johney Frame    Complete heart block (HCC)    a. s/p Medtronic ppm 10/2015.   Dyspnea    with exertion   History of radiation therapy 2002   Hodgkin disease (HCC) 2002   a. s/p chest radiation and chemotherapy   Hypertension    Hypothyroidism    sees Dr. 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  29 mm [facility] Mitral stented bovine pericardial tissue valve    ALLERGIES:  is allergic to other.  MEDICATIONS:  Current Outpatient Medications  Medication Sig Dispense Refill   aspirin EC 81 MG tablet Take 81 mg by mouth daily.     atorvastatin (LIPITOR) 40 MG tablet Take 1 tablet (40 mg total) by mouth daily. 90 tablet 3   azithromycin (ZITHROMAX Z-PAK) 250 MG tablet Take as directed on packaging. 6 each 0   carvedilol (COREG) 6.25 MG tablet Take 1 tablet (6.25 mg total) by mouth 2 (two) times daily with a meal. 180 tablet 3   Cholecalciferol 125 MCG (5000 UT) TABS Take 1 tablet by mouth daily. 30 tablet 6    levothyroxine (SYNTHROID) 75 MCG tablet Take 1 tablet (75 mcg total) by mouth daily before breakfast. 90 tablet 3   sacubitril-valsartan (ENTRESTO) 24-26 MG Take 1 tablet by mouth 2 (two) times daily. 180 tablet 3   spironolactone (ALDACTONE) 25 MG tablet Take one-half tablet (12.5 mg total) by mouth daily. 45 tablet 3   traZODone (DESYREL) 50 MG tablet Take 1 tablet (50 mg total) by mouth at bedtime as needed for sleep. 90 tablet 1   albuterol (VENTOLIN HFA) 108 (90 Base) MCG/ACT inhaler Inhale 1-2 puffs into the lungs every 6 (six) hours as needed for wheezing or shortness of breath. (Patient not taking: Reported on 04/13/2023) 8 g 2   sildenafil (VIAGRA) 50 MG tablet Take 1 tablet (50 mg total) by mouth daily as needed for erectile dysfunction. (Patient not taking: Reported on 04/13/2023) 30 tablet 3   No current facility-administered medications for this visit.    SURGICAL HISTORY:  Past Surgical History:  Procedure Laterality Date   A-FLUTTER ABLATION N/A 06/20/2021   Procedure: A-FLUTTER ABLATION;  Surgeon: Marinus Maw, MD;  Location[facility] INVASIVE CV LAB;  Service: Cardiovascular;  Laterality: N/A;   BIV UPGRADE N/A 02/08/2019   Procedure: UPGRADE TO BIV PPM;  Surgeon: Hillis Range, MD;  Location: [facility] INVASIVE CV LAB;  Service: Cardiovascular;  Laterality: N/A;   BRONCHIAL BIOPSY  12/02/2022   Procedure: BRONCHIAL BIOPSIES;  Surgeon: Leslye Peer, MD;  Location: [facility] ENDOSCOPY;  Service: Pulmonary;;   BRONCHIAL BRUSHINGS  12/02/2022   Procedure: BRONCHIAL BRUSHINGS;  Surgeon: Leslye Peer, MD;  Location: [facility] ENDOSCOPY;  Service: Pulmonary;;   BRONCHIAL NEEDLE ASPIRATION BIOPSY  12/02/2022   Procedure: BRONCHIAL NEEDLE ASPIRATION BIOPSIES;  Surgeon: Leslye Peer, MD;  Location: [facility] ENDOSCOPY;  Service: Pulmonary;;   BRONCHIAL WASHINGS  12/02/2022   Procedure: BRONCHIAL WASHINGS;  Surgeon: Leslye Peer, MD;  Location: [facility] ENDOSCOPY;  Service: Pulmonary;;   CARDIAC CATHETERIZATION  N/A 11/02/2015   Procedure: Left Heart Cath and Coronary Angiography;  Surgeon: Tonny Bollman, MD;  Location: [facility] INVASIVE CV LAB;  Service: Cardiovascular;  Laterality: N/A;   CARDIAC CATHETERIZATION N/A 11/21/2015   Procedure: Coronary Stent Intervention;  Surgeon: Tonny Bollman, MD;  Location: [facility] INVASIVE CV LAB;  Service: Cardiovascular;  Laterality: N/A;   CARDIAC CATHETERIZATION  11/2017   CARDIOVERSION N/A 03/26/2018   Procedure: CARDIOVERSION;  Surgeon: Parke Poisson, MD;  Location: [facility] ENDOSCOPY;  Service: Cardiovascular;  Laterality: N/A;   COLONOSCOPY  01/16/2015   per Dr. Christella Hartigan, benign polyps, repeat in 10 yrs    CORONARY ARTERY BYPASS GRAFT N/A 01/27/2018   Procedure: CORONARY ARTERY BYPASS GRAFTING (CABG) x two, using left internal mammary artery and right leg greater saphenous vein harvested endoscopically;  Surgeon: Purcell Nails, MD;  Location: [facility] OR;  Service: Open Heart Surgery;  Laterality: N/A;   EP IMPLANTABLE DEVICE N/A 11/02/2015   MDT Adivisa MRI conditional dual chamber pacemaker implanted by Dr Johney Frame for complete heart block   FEMUR IM NAIL Left 07/01/2022   Procedure: INTRAMEDULLARY (IM) NAIL FEMORAL LEFT ANTEGRADE;  Surgeon: Myrene Galas, MD;  Location: [facility] OR;  Service: Orthopedics;  Laterality: Left;   IR IMAGING GUIDED PORT INSERTION  01/02/2023   KNEE SURGERY Right 1972   LEAD EXTRACTION N/A 10/20/2022   Procedure: LEAD EXTRACTION;  Surgeon: Marinus Maw, MD;  Location: [facility] INVASIVE CV LAB;  Service: Cardiovascular;  Laterality: N/A;   LUMBAR LAMINECTOMY  1996   MITRAL VALVE REPLACEMENT N/A 01/27/2018   Procedure: MITRAL VALVE (MV) REPLACEMENT using Magna Mitral Ease Valve Size ;  Surgeon: Purcell Nails, MD;  Location: [facility] OR;  Service: Open Heart Surgery;  Laterality: N/A;   RIGHT/LEFT HEART CATH AND CORONARY ANGIOGRAPHY N/A 12/02/2017   Procedure: RIGHT/LEFT HEART CATH AND CORONARY ANGIOGRAPHY;  Surgeon: Corky Crafts, MD;  Location: [facility]  INVASIVE CV LAB;  Service: Cardiovascular;  Laterality: N/A;   TEE WITHOUT CARDIOVERSION N/A 12/09/2017   Procedure: TRANSESOPHAGEAL ECHOCARDIOGRAM (TEE);  Surgeon: Jodelle Red, MD;  Location: [facility] ENDOSCOPY;  Service: Cardiovascular;  Laterality: N/A;   TEE WITHOUT CARDIOVERSION N/A 01/27/2018   Procedure: TRANSESOPHAGEAL ECHOCARDIOGRAM (TEE);  Surgeon: Purcell Nails, MD;  Location: [facility] OR;  Service: Open Heart Surgery;  Laterality: N/A;   VIDEO BRONCHOSCOPY WITH RADIAL ENDOBRONCHIAL ULTRASOUND  12/02/2022   Procedure: VIDEO BRONCHOSCOPY WITH RADIAL ENDOBRONCHIAL ULTRASOUND;  Surgeon: Leslye Peer, MD;  Location: [facility] ENDOSCOPY;  Service: Pulmonary;;    REVIEW OF SYSTEMS:   Constitutional: Negative for appetite change, chills, fatigue, fever and unexpected weight change.  HENT: Negative for mouth sores, nosebleeds, sore throat and trouble swallowing.   Eyes: Negative for eye problems and icterus.  Respiratory: Negative for cough, hemoptysis, shortness of breath and wheezing.   Cardiovascular: Negative for chest pain and leg swelling.  Gastrointestinal: Negative for abdominal pain, constipation, diarrhea, nausea and vomiting.  Genitourinary: Negative for bladder incontinence, difficulty urinating, dysuria, frequency and hematuria.   Musculoskeletal: Negative for back pain, gait problem, neck pain and neck stiffness.  Skin: Positive for itching without rash.  Neurological: Negative for dizziness, extremity weakness, gait problem, headaches, light-headedness and seizures.  Hematological: Negative for adenopathy. Does not bruise/bleed easily.  Psychiatric/Behavioral: Negative for confusion, depression and sleep disturbance. The patient is not nervous/anxious   PHYSICAL EXAMINATION:  Blood pressure 112/77, pulse 82, temperature 97.6 F (36.4 C), temperature source Temporal, resp. rate 14, weight 181 lb (82.1 kg), SpO2 100%.  ECOG PERFORMANCE STATUS: 1  Physical Exam   Constitutional: Oriented to person, place, and time and well-developed, well-nourished, and in no distress.  HENT:  Head: Normocephalic and atraumatic.  Mouth/Throat: Oropharynx is clear and moist. No oropharyngeal exudate.  Eyes: Conjunctivae are normal. Right eye exhibits no discharge. Left eye exhibits no discharge. No scleral icterus.  Neck: Normal range of motion. Neck supple.  Cardiovascular: Normal rate, regular rhythm, normal heart sounds and intact distal pulses.   Pulmonary/Chest: Effort normal and breath sounds normal. No respiratory distress. No wheezes. No rales.  Abdominal: Soft. Bowel sounds are normal. Exhibits no distension and no mass. There is no tenderness.  Musculoskeletal: Normal range of motion. Exhibits no edema.  Lymphadenopathy:    No cervical adenopathy.  Neurological: Alert and oriented to person, place, and time. Exhibits normal muscle tone. Gait normal.  Coordination normal.  Skin: Skin is warm and dry. No rash noted. Not diaphoretic. No erythema. No pallor.  Psychiatric: Mood, memory and judgment normal.  Vitals reviewed.    LABORATORY DATA: Lab Results  Component Value Date   WBC 7.4 04/13/2023   HGB 12.8 (L) 04/13/2023   HCT 36.9 (L) 04/13/2023   MCV 96.6 04/13/2023   PLT 248 04/13/2023      Chemistry      Component Value Date/Time   NA 136 04/13/2023 1019   NA 137 06/06/2021 0919   K 4.7 04/13/2023 1019   CL 104 04/13/2023 1019   CO2 28 04/13/2023 1019   BUN 20 04/13/2023 1019   BUN 19 06/06/2021 0919   CREATININE 1.08 04/13/2023 1019   CREATININE 1.04 01/01/2016 0801      Component Value Date/Time   CALCIUM 9.3 04/13/2023 1019   ALKPHOS 63 04/13/2023 1019   AST 19 04/13/2023 1019   ALT 15 04/13/2023 1019   BILITOT 1.0 04/13/2023 1019       RADIOGRAPHIC STUDIES:  No results found.   ASSESSMENT/PLAN:  This is a very pleasant 68 year old never smoker Caucasian male with stage IV lung cancer, poorly differentiated carcinoma,  felt to be non-small cell lung cancer. He was diagnosed in October 2024.  He presented with metabolic central left upper lobe lung mass invading the mediastinum, small hypermetabolic right hilar lymph nodes, bilateral adrenal gland masses and splenic lesion consistent metastatic disease, hypermetabolic focus in the right gluteus maximus muscle.  His molecular studies show he is MSI high    He is currently undergoing palliative immunotherapy with Keytruda 200 mg IV every 3 weeks. He is status post 5 cycles and tolerates it well.    Labs were reviewed. Recommend that he proceed with cycle #6 today as scheduled.   We will see him back in 3 weeks for evaluation and repeat blood work before undergoing cycle #7   Patient also has a history of lymphoma and he did have radiation to the chest for Hodgkin's lymphoma in 2002 and chemotherapy.  He was previously followed by Dr. Myna Hidalgo.  We will scan a copy of his drug assistance enrollment in the chart.   He is scheduled to see genetic counseling tomorrow. We will change his lab appointment to lab/flush for possible genetic testing. He does not need repeat CBC and Cmp tomorrow.   He can use Claritin if needed for itching.   He will continue for now to have his PSA checked every 6 months or so with his urologist or PCP.   I will arrange for a restaging CT scan of the CAP prior to his next cycle of treatment.   The patient was advised to call immediately if he has any concerning symptoms in the interval. The patient voices understanding of current disease status and treatment options and is in agreement with the current care plan. All questions were answered. The patient knows to call the clinic with any problems, questions or concerns. We can certainly see the patient much sooner if necessary  Orders Placed This Encounter  Procedures   CT CHEST ABDOMEN PELVIS W CONTRAST    Standing Status:   Future    Expected Date:   04/27/2023    Expiration Date:    04/12/2024    If indicated for the ordered procedure, I authorize the administration of contrast media per Radiology protocol:   Yes    Does the patient have a contrast media/X-ray dye allergy?:  No    Preferred imaging location?:   [facility]    If indicated for the ordered procedure, I authorize the administration of oral contrast media per Radiology protocol:   Yes     The total time spent in the appointment was 20-29 minutes  Anil Havard L Dequita Schleicher, PA-C 04/13/23

## 2023-04-13 ENCOUNTER — Inpatient Hospital Stay: Payer: 59

## 2023-04-13 ENCOUNTER — Inpatient Hospital Stay: Payer: 59 | Attending: Physician Assistant

## 2023-04-13 ENCOUNTER — Inpatient Hospital Stay (HOSPITAL_BASED_OUTPATIENT_CLINIC_OR_DEPARTMENT_OTHER): Payer: 59 | Admitting: Physician Assistant

## 2023-04-13 ENCOUNTER — Encounter: Payer: Self-pay | Admitting: Genetic Counselor

## 2023-04-13 VITALS — BP 112/77 | HR 82 | Temp 97.6°F | Resp 14 | Wt 181.0 lb

## 2023-04-13 DIAGNOSIS — C799 Secondary malignant neoplasm of unspecified site: Secondary | ICD-10-CM | POA: Diagnosis not present

## 2023-04-13 DIAGNOSIS — C3492 Malignant neoplasm of unspecified part of left bronchus or lung: Secondary | ICD-10-CM

## 2023-04-13 DIAGNOSIS — Z5112 Encounter for antineoplastic immunotherapy: Secondary | ICD-10-CM | POA: Diagnosis not present

## 2023-04-13 DIAGNOSIS — C7971 Secondary malignant neoplasm of right adrenal gland: Secondary | ICD-10-CM | POA: Insufficient documentation

## 2023-04-13 DIAGNOSIS — Z8571 Personal history of Hodgkin lymphoma: Secondary | ICD-10-CM | POA: Diagnosis not present

## 2023-04-13 DIAGNOSIS — C7972 Secondary malignant neoplasm of left adrenal gland: Secondary | ICD-10-CM | POA: Insufficient documentation

## 2023-04-13 DIAGNOSIS — C3412 Malignant neoplasm of upper lobe, left bronchus or lung: Secondary | ICD-10-CM | POA: Insufficient documentation

## 2023-04-13 DIAGNOSIS — Z9221 Personal history of antineoplastic chemotherapy: Secondary | ICD-10-CM | POA: Insufficient documentation

## 2023-04-13 LAB — CBC WITH DIFFERENTIAL (CANCER CENTER ONLY)
Abs Immature Granulocytes: 0.02 10*3/uL (ref 0.00–0.07)
Basophils Absolute: 0 10*3/uL (ref 0.0–0.1)
Basophils Relative: 1 %
Eosinophils Absolute: 0.2 10*3/uL (ref 0.0–0.5)
Eosinophils Relative: 3 %
HCT: 36.9 % — ABNORMAL LOW (ref 39.0–52.0)
Hemoglobin: 12.8 g/dL — ABNORMAL LOW (ref 13.0–17.0)
Immature Granulocytes: 0 %
Lymphocytes Relative: 14 %
Lymphs Abs: 1 10*3/uL (ref 0.7–4.0)
MCH: 33.5 pg (ref 26.0–34.0)
MCHC: 34.7 g/dL (ref 30.0–36.0)
MCV: 96.6 fL (ref 80.0–100.0)
Monocytes Absolute: 0.4 10*3/uL (ref 0.1–1.0)
Monocytes Relative: 6 %
Neutro Abs: 5.7 10*3/uL (ref 1.7–7.7)
Neutrophils Relative %: 76 %
Platelet Count: 248 10*3/uL (ref 150–400)
RBC: 3.82 MIL/uL — ABNORMAL LOW (ref 4.22–5.81)
RDW: 13.1 % (ref 11.5–15.5)
WBC Count: 7.4 10*3/uL (ref 4.0–10.5)
nRBC: 0 % (ref 0.0–0.2)

## 2023-04-13 LAB — CMP (CANCER CENTER ONLY)
ALT: 15 U/L (ref 0–44)
AST: 19 U/L (ref 15–41)
Albumin: 4.4 g/dL (ref 3.5–5.0)
Alkaline Phosphatase: 63 U/L (ref 38–126)
Anion gap: 4 — ABNORMAL LOW (ref 5–15)
BUN: 20 mg/dL (ref 8–23)
CO2: 28 mmol/L (ref 22–32)
Calcium: 9.3 mg/dL (ref 8.9–10.3)
Chloride: 104 mmol/L (ref 98–111)
Creatinine: 1.08 mg/dL (ref 0.61–1.24)
GFR, Estimated: >60 mL/min (ref >60)
Glucose, Bld: 97 mg/dL (ref 70–99)
Potassium: 4.7 mmol/L (ref 3.5–5.1)
Sodium: 136 mmol/L (ref 135–145)
Total Bilirubin: 1 mg/dL (ref 0.0–1.2)
Total Protein: 6.7 g/dL (ref 6.5–8.1)

## 2023-04-13 LAB — TSH: TSH: 4.856 u[IU]/mL — ABNORMAL HIGH (ref 0.350–4.500)

## 2023-04-13 MED ORDER — SODIUM CHLORIDE 0.9 % IV SOLN
200.0000 mg | Freq: Once | INTRAVENOUS | Status: AC
  Administered 2023-04-13: 200 mg via INTRAVENOUS
  Filled 2023-04-13: qty 200

## 2023-04-13 MED ORDER — SODIUM CHLORIDE 0.9% FLUSH
10.0000 mL | INTRAVENOUS | Status: DC | PRN
  Administered 2023-04-13: 10 mL

## 2023-04-13 MED ORDER — HEPARIN SOD (PORK) LOCK FLUSH 100 UNIT/ML IV SOLN
500.0000 [IU] | Freq: Once | INTRAVENOUS | Status: AC | PRN
  Administered 2023-04-13: 500 [IU]

## 2023-04-13 MED ORDER — SODIUM CHLORIDE 0.9 % IV SOLN
INTRAVENOUS | Status: DC
Start: 2023-04-13 — End: 2023-04-13

## 2023-04-13 NOTE — Patient Instructions (Signed)

## 2023-04-14 ENCOUNTER — Encounter: Payer: Self-pay | Admitting: Genetic Counselor

## 2023-04-14 ENCOUNTER — Other Ambulatory Visit: Payer: Self-pay | Admitting: Genetic Counselor

## 2023-04-14 ENCOUNTER — Other Ambulatory Visit: Payer: 59

## 2023-04-14 ENCOUNTER — Inpatient Hospital Stay

## 2023-04-14 ENCOUNTER — Inpatient Hospital Stay (HOSPITAL_BASED_OUTPATIENT_CLINIC_OR_DEPARTMENT_OTHER): Payer: 59 | Admitting: Genetic Counselor

## 2023-04-14 VITALS — BP 111/72 | HR 84 | Temp 98.6°F | Resp 16

## 2023-04-14 DIAGNOSIS — C3492 Malignant neoplasm of unspecified part of left bronchus or lung: Secondary | ICD-10-CM

## 2023-04-14 DIAGNOSIS — Z95828 Presence of other vascular implants and grafts: Secondary | ICD-10-CM

## 2023-04-14 DIAGNOSIS — Z8571 Personal history of Hodgkin lymphoma: Secondary | ICD-10-CM | POA: Diagnosis not present

## 2023-04-14 DIAGNOSIS — Z8042 Family history of malignant neoplasm of prostate: Secondary | ICD-10-CM

## 2023-04-14 DIAGNOSIS — C7971 Secondary malignant neoplasm of right adrenal gland: Secondary | ICD-10-CM | POA: Diagnosis not present

## 2023-04-14 DIAGNOSIS — Z8052 Family history of malignant neoplasm of bladder: Secondary | ICD-10-CM

## 2023-04-14 DIAGNOSIS — C7972 Secondary malignant neoplasm of left adrenal gland: Secondary | ICD-10-CM | POA: Diagnosis not present

## 2023-04-14 DIAGNOSIS — Z5112 Encounter for antineoplastic immunotherapy: Secondary | ICD-10-CM | POA: Diagnosis not present

## 2023-04-14 DIAGNOSIS — Z800 Family history of malignant neoplasm of digestive organs: Secondary | ICD-10-CM | POA: Diagnosis not present

## 2023-04-14 DIAGNOSIS — Z9221 Personal history of antineoplastic chemotherapy: Secondary | ICD-10-CM | POA: Diagnosis not present

## 2023-04-14 DIAGNOSIS — C3412 Malignant neoplasm of upper lobe, left bronchus or lung: Secondary | ICD-10-CM | POA: Diagnosis not present

## 2023-04-14 LAB — T4: T4, Total: 7.6 ug/dL (ref 4.5–12.0)

## 2023-04-14 LAB — GENETIC SCREENING ORDER

## 2023-04-14 MED ORDER — SODIUM CHLORIDE 0.9% FLUSH
10.0000 mL | Freq: Once | INTRAVENOUS | Status: AC
  Administered 2023-04-14: 10 mL

## 2023-04-14 MED ORDER — HEPARIN SOD (PORK) LOCK FLUSH 100 UNIT/ML IV SOLN
500.0000 [IU] | Freq: Once | INTRAVENOUS | Status: AC
  Administered 2023-04-14: 500 [IU]

## 2023-04-14 NOTE — Progress Notes (Signed)
 REFERRING PROVIDER: Heilingoetter, Johnette Abraham, PA-C [address] [place],  [place] [address]  PRIMARY PROVIDER:  [name], MD  PRIMARY REASON FOR VISIT:  1. Family history of prostate cancer   2. Family history of stomach cancer   3. History of Hodgkin's disease   4. Non-small cell carcinoma of left lung, stage 4 (HCC)   5. Family history of bladder cancer      HISTORY OF PRESENT ILLNESS:   Mr. Mcbryar, a 68 y.o. male, was seen for a [place] cancer genetics consultation at the request of Dr. Theodis Blaze due to a personal and family history of cancer.  Mr. Onnen presents to clinic today to discuss the possibility of a hereditary predisposition to cancer, genetic testing, and to further clarify his future cancer risks, as well as potential cancer risks for family members.   In 2001, at the age of 63, Mr. Kostelnik was diagnosed with lymphoma.  This was treated with chemotherapy.  In 2024, at the age of 60, Ms. Mantz was diagnosed with non-small cell carcinoma of the lung. 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    CANCER HISTORY:  Oncology History  Non-small cell carcinoma of left lung, stage 4 (HCC)  12/23/2022 Initial Diagnosis   Non-small cell carcinoma of left lung, stage 4 (HCC)   12/23/2022 Cancer Staging   Staging form: Lung, AJCC 8th Edition - Clinical: Stage IVB (cT2b, cN3, cM1c) - Signed by Si Gaul, MD on 12/23/2022   12/29/2022 -  Chemotherapy   Patient is on Treatment Plan : LUNG NSCLC Pembrolizumab (200) q21d       Past Medical History:  Diagnosis Date   Abnormal TSH    Arthritis    ddd   Atrial fibrillation and flutter (HCC)    s/p DCCV 03/26/2018   CAD in native artery    a. 11/2015: s/p 2 vessel PCI of the first diagonal branch of the LAD in the mid left circumflex with DES. // s/p CABG 01/2018   Chronic combined systolic and diastolic CHF (congestive heart failure) (HCC)    sees Dr. Johney Frame     Complete heart block (HCC)    a. s/p Medtronic ppm 10/2015.   Dyspnea    with exertion   Family history of bladder cancer    Family history of prostate cancer    Family history of stomach cancer    History of radiation therapy 2002   Hodgkin disease (HCC) 2002   a. s/p chest radiation and chemotherapy   Hypertension    Hypothyroidism    sees Dr. 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   29 mm [place] Mitral stented bovine pericardial tissue valve    Past Surgical History:  Procedure Laterality Date   A-FLUTTER ABLATION N/A 06/20/2021   Procedure: A-FLUTTER ABLATION;  Surgeon: Marinus Maw, MD;  Location: [facility] INVASIVE CV LAB;  Service: Cardiovascular;  Laterality: N/A;   BIV UPGRADE  N/A 02/08/2019   Procedure: UPGRADE TO BIV PPM;  Surgeon: Hillis Range, MD;  Location: [facility] INVAS[facility] CV LAB;  Service: Cardiovascular;  Laterality: N/A;   BRONCHIAL BIOPSY  12/02/2022   Procedure: BRONCHIAL BIOPSIES;  Surgeon: Leslye Peer, MD;  Location: [facility] ENDOSCOPY;  Service: Pulmonary;;   BRONCHIAL BRUSHINGS  12/02/2022   Procedure: BRONCHIAL BRUSHINGS;  Surgeon: Leslye Peer, MD;  Location: [facility] ENDOSCOPY;  Service: Pulmonary;;   BRONCHIAL NEEDLE ASPIRATION BIOPSY  12/02/2022   Procedure: BRONCHIAL NEEDLE ASPIRATION BIOPSIES;  Surgeon: Leslye Peer, MD;  Location: [facility] ENDOSCOPY;  Service: Pulmonary;;   BRONCHIAL WASHINGS  12/02/2022   Procedure: BRONCHIAL WASHINGS;  Surgeon: Leslye Peer, MD;  Location: [facility] ENDOSCOPY;  Service:  Pulmonary;;   CARDIAC CATHETERIZATION N/A 11/02/2015   Procedure: Left Heart Cath and Coronary Angiography;  Surgeon: Tonny Bollman, MD;  Location: [facility] INVASIVE CV LAB;  Service: Cardiovascular;  Laterality: N/A;   CARDIAC CATHETERIZATION N/A 11/21/2015   Procedure: Coronary Stent Intervention;  Surgeon: Tonny Bollman, MD;  Location: [facility] INVASIVE CV LAB;  Service: Cardiovascular;  Laterality: N/A;   CARDIAC CATHETERIZATION  11/2017   CARDIOVERSION N/A 03/26/2018   Procedure: CARDIOVERSION;  Surgeon: Parke Poisson, MD;  Location: [facility] ENDOSCOPY;  Service: Cardiovascular;  Laterality: N/A;   COLONOSCOPY  01/16/2015   per Dr. Christella Hartigan, benign polyps, repeat in 10 yrs    CORONARY ARTERY BYPASS GRAFT N/A 01/27/2018   Procedure: CORONARY ARTERY BYPASS GRAFTING (CABG) x two, using left internal mammary artery and right leg greater saphenous vein harvested endoscopically;  Surgeon: Purcell Nails, MD;  Location: [facility] OR;  Service: Open Heart Surgery;  Laterality: N/A;   EP IMPLANTABLE DEVICE N/A 11/02/2015   MDT Adivisa MRI conditional dual chamber pacemaker implanted by Dr Johney Frame for complete heart block   FEMUR IM NAIL Left 07/01/2022   Procedure: INTRAMEDULLARY (IM) NAIL FEMORAL LEFT ANTEGRADE;  Surgeon: Myrene Galas, MD;  Location: [facility] OR;  Service: Orthopedics;  Laterality: Left;   IR IMAGING GUIDED PORT INSERTION  01/02/2023   KNEE SURGERY Right 1972   LEAD EXTRACTION N/A 10/20/2022   Procedure: LEAD EXTRACTION;  Surgeon: Marinus Maw, MD;  Location: [facility] INVASIVE CV LAB;  Service: Cardiovascular;  Laterality: N/A;   LUMBAR LAMINECTOMY  1996   MITRAL VALVE REPLACEMENT N/A 01/27/2018   Procedure: MITRAL VALVE (MV) REPLACEMENT using Magna Mitral Ease Valve Size ;  Surgeon: Purcell Nails, MD;  Location: [facility] OR;  Service: Open Heart Surgery;  Laterality: N/A;   RIGHT/LEFT HEART CATH AND CORONARY ANGIOGRAPHY N/A 12/02/2017   Procedure: RIGHT/LEFT HEART CATH AND CORONARY ANGIOGRAPHY;  Surgeon:  Corky Crafts, MD;  Location: [facility] INVASIVE CV LAB;  Service: Cardiovascular;  Laterality: N/A;   TEE WITHOUT CARDIOVERSION N/A 12/09/2017   Procedure: TRANSESOPHAGEAL ECHOCARDIOGRAM (TEE);  Surgeon: Jodelle Red, MD;  Location: [facility] ENDOSCOPY;  Service: Cardiovascular;  Laterality: N/A;   TEE WITHOUT CARDIOVERSION N/A 01/27/2018   Procedure: TRANSESOPHAGEAL ECHOCARDIOGRAM (TEE);  Surgeon: Purcell Nails, MD;  Location: [facility] OR;  Service: Open Heart Surgery;  Laterality: N/A;   VIDEO BRONCHOSCOPY WITH RADIAL ENDOBRONCHIAL ULTRASOUND  12/02/2022   Procedure: VIDEO BRONCHOSCOPY WITH RADIAL ENDOBRONCHIAL ULTRASOUND;  Surgeon: Leslye Peer, MD;  Location: [facility] ENDOSCOPY;  Service: Pulmonary;;    Social History   Socioeconomic History   Marital status: Married    Spouse name: Not on file   Number of children: 3   Years of education: Masters   Highest education level: Not on file  Occupational History   Occupation: Clinical research associate    Comment: [employer]  Tobacco Use   Smoking status: Never   Smokeless tobacco: Never  Vaping Use   Vaping status: Never Used  Substance and Sexual Activity   Alcohol use: Yes    Alcohol/week: 3.0 standard drinks of alcohol    Types: 3 Glasses of wine per week   Drug use: No   Sexual activity: Not on file  Other Topics Concern   Not on file  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[place]   Social Drivers of Health   Financial Resource Strain: Not on file  Food Insecurity: No Food Insecurity (10/20/2022)   Hunger Vital Sign    Worried About Running Out of Food in the Last Year: Never true    Ran Out of Food in the Last Year: Never true  Transportation Needs: No Transportation Needs (10/20/2022)   PRAPARE - Administrator, Civil Service (Medical): No    Lack of Transportation (Non-Medical): No  Physical Activity: Not on file  Stress: Not on file  Social Connections: Not on file     FAMILY HISTORY:   We obtained a detailed, 4-generation family history.  Significant diagnoses are listed below: Family History  Problem Relation Age of Onset   Heart disease Mother    Heart disease Father    Prostate cancer Father 13   Bladder Cancer Father 33   Stomach cancer Paternal Aunt    Colon cancer Neg Hx      The patient has a son and two daughters who are cancer free.  He has a brother and sister who are cancer free.  His parents are deceased.  The patients father had prostate cancer at 39 and possible bladder cancer at 45.  He had three sisters, one had stomach cancer.  The paternal grandparents did not have cancer.  The patient's mother died of heart disease.  She had two brothers and a paternal half sister who are cancer free.   Mr. Geisinger is unaware of previous family history of genetic testing for hereditary cancer risks. Patient's maternal ancestors are of [place] descent, and paternal ancestors are of [place] descent. There is no reported Ashkenazi Jewish ancestry. There is no known consanguinity.  GENETIC COUNSELING ASSESSMENT: Mr. Stuber is a 68 y.o. male with a personal and family history of cancer which is somewhat suggestive of a hereditary cancer syndrome and predisposition to cancer given his MSI-H status tumor status. We, therefore, discussed and recommended the following at today's visit.   DISCUSSION: We discussed that, in general, most cancer is not inherited in families, but instead is sporadic or familial. Sporadic cancers occur by chance and typically happen at older ages (>50 years) as this type of cancer is caused by genetic changes acquired during an individual's lifetime. Some families have more cancers than would be expected by chance; however, the ages or types of cancer are not consistent with a known genetic mutation or known genetic mutations have been ruled out. This type of familial cancer is thought to be due to a combination of multiple genetic, environmental,  hormonal, and lifestyle factors. While this combination of factors likely increases the risk of cancer, the exact source of this risk is not currently identifiable or testable.  We discussed that 5 - 10% of cancer is hereditary.  The risk for being hereditary is gene dependant.  Per the 3.2024 NCCN Colon cancer guidelines, tumors  with MSI-H  should be referred for germline MMR status.     We reviewed the characteristics, features and inheritance patterns of hereditary cancer syndromes. We also discussed genetic testing, including the appropriate family members to test, the process of testing, insurance coverage and turn-around-time for results. We discussed the implications of a negative, positive, carrier and/or variant of uncertain significant result. Mr. Orgeron  was offered a common hereditary cancer panel (39 genes) + leukemia/lymphoma genes.  Mr. Carchi decided to pursue genetic testing for the CancerNext+leukemia/lymphoma gene panel.   Based on Mr. Trefz personal and family history of cancer, he meets NCCN criteria for genetic testing. Despite that he meets criteria, he may still have an out of pocket cost. We discussed that if his out of pocket cost for testing is over $100, the laboratory will call and confirm whether he wants to proceed with testing.  If the out of pocket cost of testing is less than $100 he will be billed by the genetic testing laboratory.   PLAN: After considering the risks, benefits, and limitations, Mr. Theiss provided informed consent to pursue genetic testing and the blood sample was sent to [facility] for analysis of the CancerNext+leukemia/lymphoma + RNA. Results should be available within approximately 2-3 weeks' time, at which point they will be disclosed by telephone to Mr. Sachs, as will any additional recommendations warranted by these results. Mr. Barbian will receive a summary of his genetic counseling visit and a copy of his results once  available. This information will also be available in Epic.   Lastly, we encouraged Mr. Pound to remain in contact with cancer genetics annually so that we can continuously update the family history and inform him of any changes in cancer genetics and testing that may be of benefit for this family.   Mr. Mathieson questions were answered to his satisfaction today. Our contact information was provided should additional questions or concerns arise. Thank you for the referral and allowing [place] to share in the care of your patient.   Kier Smead P. Lowell Guitar, MS, CGC Licensed, Patent attorney [email or web address] phone: [phone]  80 minutes were spent on the date of the encounter in service to the patient including preparation, face-to-face consultation, documentation and care coordination.  The patient was seen alone. Drs. Meliton Rattan, and/or [place] were available for questions, if needed..    _______________________________________________________________________ For Office Staff:  Number of people involved in session: 1 Was an Intern/ student involved with case: no

## 2023-04-14 NOTE — Progress Notes (Deleted)
 REFERRING PROVIDER: [name], MD [address] [place],  [place] [address]  PRIMARY PROVIDER:  [name], MD  PRIMARY REASON FOR VISIT:  1. Family history of prostate cancer   2. Family history of stomach cancer   3. History of Hodgkin's disease   4. Non-small cell carcinoma of left lung, stage 4 (HCC)   5. Family history of bladder cancer      HISTORY OF PRESENT ILLNESS:   Mr. Crall, a 68 y.o. male, was seen for a [place] cancer genetics consultation at the request of Dr. Clent Ridges due to a {Personal/family:20331} history of {cancer/polyps}.  Mr. Berenson presents to clinic today to discuss the possibility of a hereditary predisposition to cancer, genetic testing, and to further clarify his future cancer risks, as well as potential cancer risks for family members.   In ***, at the age of ***, Mr. Dimock was diagnosed with {CA PATHOLOGY:63853} of the {right left (wildcard):15202} {CA KVQQV:95638}. The treatment plan ***.    *** Mr. Sallade is a 68 y.o. male with no personal history of cancer.    CANCER HISTORY:  Oncology History  Non-small cell carcinoma of left lung, stage 4 (HCC)  12/23/2022 Initial Diagnosis   Non-small cell carcinoma of left lung, stage 4 (HCC)   12/23/2022 Cancer Staging   Staging form: Lung, AJCC 8th Edition - Clinical: Stage IVB (cT2b, cN3, cM1c) - Signed by Si Gaul, MD on 12/23/2022   12/29/2022 -  Chemotherapy   Patient is on Treatment Plan : LUNG NSCLC Pembrolizumab (200) q21d        RISK FACTORS:  Menarche was at age ***.  First live birth at age ***.  OCP use for approximately {Numbers 1-12 multi-select:20307} years.  Ovaries intact: {Yes/No-Ex:120004}.  Hysterectomy: {Yes/No-Ex:120004}.  Menopausal status: {Menopause:31378}.  HRT use: {Numbers 1-12 multi-select:20307} years. Colonoscopy: {Yes/No-Ex:120004}; {normal/abnormal/not examined:14677}. Mammogram within the last year: {Yes/No-Ex:120004}. Number of breast  biopsies: {Numbers 1-12 multi-select:20307}. Up to date with pelvic exams: {Yes/No-Ex:120004}. Any excessive radiation exposure in the past: {Yes/No-Ex:120004}  Past Medical History:  Diagnosis Date   Abnormal TSH    Arthritis    ddd   Atrial fibrillation and flutter (HCC)    s/p DCCV 03/26/2018   CAD in native artery    a. 11/2015: s/p 2 vessel PCI of the first diagonal branch of the LAD in the mid left circumflex with DES. // s/p CABG 01/2018   Chronic combined systolic and diastolic CHF (congestive heart failure) (HCC)    sees Dr. Johney Frame    Complete heart block (HCC)    a. s/p Medtronic ppm 10/2015.   Dyspnea    with exertion   Family history of bladder cancer    Family history of prostate cancer    Family history of stomach cancer    History of radiation therapy 2002   Hodgkin disease (HCC) 2002   a. s/p chest radiation and chemotherapy   Hypertension    Hypothyroidism    sees Dr. Elvera Lennox    Incidental pulmonary nodule 01/19/2018   Left apical opacity - possible scarring   Inguinal hernia 10/14/2022   bilateral   Mitral regurgitation    s/p bioprosthetic MV replacement 01/2018   Non-small cell lung cancer (NSCLC) (HCC) 11/2022   Pacemaker 10/2015   Medtronic   PVC's (premature ventricular contractions)    Right bundle branch block    S/P CABG x 2 01/27/2018   LIMA to LAD, SVG to RCA, EVH via right thigh  S/P mitral valve replacement with bioprosthetic valve 01/27/2018   29 mm [facility] Mitral stented bovine pericardial tissue valve    Past Surgical History:  Procedure Laterality Date   A-FLUTTER ABLATION N/A 06/20/2021   Procedure: A-FLUTTER ABLATION;  Surgeon: Marinus Maw, MD;  Location: [facility] INVASIVE CV LAB;  Service: Cardiovascular;  Laterality: N/A;   BIV UPGRADE N/A 02/08/2019   Procedure: UPGRADE TO BIV PPM;  Surgeon: Hillis Range, MD;  Location: [facility] INVAS[facility] CV LAB;  Service: Cardiovascular;  Laterality: N/A;   BRONCHIAL BIOPSY  12/02/2022    Procedure: BRONCHIAL BIOPSIES;  Surgeon: Leslye Peer, MD;  Location: [facility] ENDOSCOPY;  Service: Pulmonary;;   BRONCHIAL BRUSHINGS  12/02/2022   Procedure: BRONCHIAL BRUSHINGS;  Surgeon: Leslye Peer, MD;  Location: [facility] ENDOSCOPY;  Service: Pulmonary;;   BRONCHIAL NEEDLE ASPIRATION BIOPSY  12/02/2022   Procedure: BRONCHIAL NEEDLE ASPIRATION BIOPSIES;  Surgeon: Leslye Peer, MD;  Location: [facility] ENDOSCOPY;  Service: Pulmonary;;   BRONCHIAL WASHINGS  12/02/2022   Procedure: BRONCHIAL WASHINGS;  Surgeon: Leslye Peer, MD;  Location: [facility] ENDOSCOPY;  Service: Pulmonary;;   CARDIAC CATHETERIZATION N/A 11/02/2015   Procedure: Left Heart Cath and Coronary Angiography;  Surgeon: Tonny Bollman, MD;  Location: [facility] INVASIVE CV LAB;  Service: Cardiovascular;  Laterality: N/A;   CARDIAC CATHETERIZATION N/A 11/21/2015   Procedure: Coronary Stent Intervention;  Surgeon: Tonny Bollman, MD;  Location: [facility] INVASIVE CV LAB;  Service: Cardiovascular;  Laterality: N/A;   CARDIAC CATHETERIZATION  11/2017   CARDIOVERSION N/A 03/26/2018   Procedure: CARDIOVERSION;  Surgeon: Parke Poisson, MD;  Location: [facility] ENDOSCOPY;  Service: Cardiovascular;  Laterality: N/A;   COLONOSCOPY  01/16/2015   per Dr. Christella Hartigan, benign polyps, repeat in 10 yrs    CORONARY ARTERY BYPASS GRAFT N/A 01/27/2018   Procedure: CORONARY ARTERY BYPASS GRAFTING (CABG) x two, using left internal mammary artery and right leg greater saphenous vein harvested endoscopically;  Surgeon: Purcell Nails, MD;  Location: [facility] OR;  Service: Open Heart Surgery;  Laterality: N/A;   EP IMPLANTABLE DEVICE N/A 11/02/2015   MDT Adivisa MRI conditional dual chamber pacemaker implanted by Dr Johney Frame for complete heart block   FEMUR IM NAIL Left 07/01/2022   Procedure: INTRAMEDULLARY (IM) NAIL FEMORAL LEFT ANTEGRADE;  Surgeon: Myrene Galas, MD;  Location: [facility] OR;  Service: Orthopedics;  Laterality: Left;   IR IMAGING GUIDED PORT INSERTION  01/02/2023   KNEE SURGERY  Right 1972   LEAD EXTRACTION N/A 10/20/2022   Procedure: LEAD EXTRACTION;  Surgeon: Marinus Maw, MD;  Location: [facility] INVAS[facility] CV LAB;  Service: Cardiovascular;  Laterality: N/A;   LUMBAR LAMINECTOMY  1996   MITRAL VALVE REPLACEMENT N/A 01/27/2018   Procedure: MITRAL VALVE (MV) REPLACEMENT using Magna Mitral Ease Valve Size ;  Surgeon: Purcell Nails, MD;  [facility];  Service: Open Heart Surgery;  Laterality: N/A;   RIGHT/LEFT HEART CATH AND CORONARY ANGIOGRAPHY N/A 12/02/2017   Procedure: RIGHT/LEFT HEART CATH AND CORONARY ANGIOGRAPHY;  Surgeon: Corky Crafts, MD;  Location: [facility] INVASIVE CV LAB;  Service: Cardiovascular;  Laterality: N/A;   TEE WITHOUT CARDIOVERSION N/A 12/09/2017   Procedure: TRANSESOPHAGEAL ECHOCARDIOGRAM (TEE);  Surgeon: Jodelle Red, MD;  Location: [facility] ENDOSCOPY;  Service: Cardiovascular;  Laterality: N/A;   TEE WITHOUT CARDIOVERSION N/A 01/27/2018   Procedure: TRANSESOPHAGEAL ECHOCARDIOGRAM (TEE);  Surgeon: Purcell Nails, MD;  Location: [facility] OR;  Service: Open Heart Surgery;  Laterality: N/A;   VIDEO BRONCHOSCOPY WITH RADIAL ENDOBRONCHIAL ULTRASOUND  12/02/2022  Procedure: VIDEO BRONCHOSCOPY WITH RADIAL ENDOBRONCHIAL ULTRASOUND;  Surgeon: Leslye Peer, MD;  Location: [facility] ENDOSCOPY;  Service: Pulmonary;;    Social History   Socioeconomic History   Marital status: Married    Spouse name: Not on file   Number of children: 3   Years of education: Masters   Highest education level: Not on file  Occupational History   Occupation: lawyer    Comment: [employer]  Tobacco Use   Smoking status: Never   Smokeless tobacco: Never  Vaping Use   Vaping status: Never Used  Substance and Sexual Activity   Alcohol use: Yes    Alcohol/week: 3.0 standard drinks of alcohol    Types: 3 Glasses of wine per week   Drug use: No   Sexual activity: Not on file  Other Topics Concern   Not on file  Social History Narrative    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 [place]   Social Drivers of Health   Financial Resource Strain: Not on file  Food Insecurity: No Food Insecurity (10/20/2022)   Hunger Vital Sign    Worried About Running Out of Food in the Last Year: Never true    Ran Out of Food in the Last Year: Never true  Transportation Needs: No Transportation Needs (10/20/2022)   PRAPARE - Administrator, Civil Service (Medical): No    Lack of Transportation (Non-Medical): No  Physical Activity: Not on file  Stress: Not on file  Social Connections: Not on file     FAMILY HISTORY:  We obtained a detailed, 4-generation family history.  Significant diagnoses are listed below: Family History  Problem Relation Age of Onset   Heart disease Mother    Heart disease Father    Prostate cancer Father 41   Bladder Cancer Father 75   Stomach cancer Paternal Aunt    Colon cancer Neg Hx     Mr. Locker is {aware/unaware} of previous family history of genetic testing for hereditary cancer risks. Patient's maternal ancestors are of *** descent, and paternal ancestors are of *** descent. There {IS NO:12509} reported Ashkenazi Jewish ancestry. There {IS NO:12509} known consanguinity.  GENETIC COUNSELING ASSESSMENT: Mr. Mckoy is a 69 y.o. male with a {Personal/family:20331} history of {cancer/polyps} which is somewhat suggestive of a {DISEASE} and predisposition to cancer given ***. We, therefore, discussed and recommended the following at today's visit.   DISCUSSION: We discussed that, in general, most cancer is not inherited in families, but instead is sporadic or familial. Sporadic cancers occur by chance and typically happen at older ages (>50 years) as this type of cancer is caused by genetic changes acquired during an individual's lifetime. Some families have more cancers than would be expected by chance; however, the ages or types of cancer are not consistent with a known genetic mutation or known genetic mutations have been  ruled out. This type of familial cancer is thought to be due to a combination of multiple genetic, environmental, hormonal, and lifestyle factors. While this combination of factors likely increases the risk of cancer, the exact source of this risk is not currently identifiable or testable.  We discussed that *** - ***% of *** is hereditary, with most cases associated with ***.  There are other genes that can be associated with hereditary *** cancer syndromes.  These include ***.  We discussed that testing is beneficial for several reasons including knowing how to follow individuals after completing their treatment, identifying whether potential treatment  options such as PARP inhibitors would be beneficial, and understand if other family members could be at risk for cancer and allow them to undergo genetic testing.   We reviewed the characteristics, features and inheritance patterns of hereditary cancer syndromes. We also discussed genetic testing, including the appropriate family members to test, the process of testing, insurance coverage and turn-around-time for results. We discussed the implications of a negative, positive, carrier and/or variant of uncertain significant result. Mr. Frerking  was offered a common hereditary cancer panel (36+ genes) and an expanded pan-cancer panel (70+ genes). Mr. Dimmick was informed of the benefits and limitations of each panel, including that expanded pan-cancer panels contain genes that do not have clear management guidelines at this point in time.  We also discussed that as the number of genes included on a panel increases, the chances of variants of uncertain significance increases. Mr. Mckillop decided to pursue genetic testing for the *** gene panel.   Based on Mr. Renne {Personal/family:20331} history of cancer, he meets medical criteria for genetic testing. ***Though Mr. Mccamy is not personally affected, there are no affected family members that are  willing/able/available to undergo hereditary cancer testing.  Therefore, Mr. Mayorquin the most informative family member available.  Despite that he meets criteria, he may still have an out of pocket cost. We discussed that if his out of pocket cost for testing is over $100, the laboratory will call and confirm whether he wants to proceed with testing.  If the out of pocket cost of testing is less than $100 he will be billed by the genetic testing laboratory.   We discussed that some people do not want to undergo genetic testing due to fear of genetic discrimination.  The Genetic Information Nondiscrimination Act (GINA) was signed into federal law in 2008. GINA prohibits health insurers and most employers from discriminating against individuals based on genetic information (including the results of genetic tests and family history information). According to GINA, health insurance companies cannot consider genetic information to be a preexisting condition, nor can they use it to make decisions regarding coverage or rates. GINA also makes it illegal for most employers to use genetic information in making decisions about hiring, firing, promotion, or terms of employment. It is important to note that GINA does not offer protections for life insurance, disability insurance, or long-term care insurance. GINA does not apply to those in the [employer], those who work for companies with less than 15 employees, and new life insurance or long-term disability insurance policies.  Health status due to a cancer diagnosis is not protected under GINA. More information about GINA can be found by visiting [email or web address].  ***We reviewed the characteristics, features and inheritance patterns of hereditary cancer syndromes. We also discussed genetic testing, including the appropriate family members to test, the process of testing, insurance coverage and turn-around-time for results. We discussed the implications of a negative,  positive and/or variant of uncertain significant result. In order to get genetic test results in a timely manner so that Mr. Que can use these genetic test results for surgical decisions, we recommended Mr. Attridge pursue genetic testing for the ***. Once complete, we recommend Mr. Bisono pursue reflex genetic testing to the *** gene panel.   Based on Mr. Thibeau {Personal/family:20331} history of cancer, he meets medical criteria for genetic testing. Despite that he meets criteria, he may still have an out of pocket cost.   ***We discussed with Mr. Wilczynski that the {Personal/family:20331} history does not meet  insurance or NCCN criteria for genetic testing and, therefore, is not highly consistent with a familial hereditary cancer syndrome.  We feel he is at low risk to harbor a gene mutation associated with such a condition. Thus, we did not recommend any genetic testing, at this time, and recommended Mr. Strohecker continue to follow the cancer screening guidelines given by his primary healthcare provider.  ***In order to estimate his chance of having a {CA GENE:62345} mutation, we used statistical models ({GENMODELS:62370}) that consider his personal medical history, family history and ancestry.  Because each model is different, there can be a lot of variability in the risks they give.  Therefore, these numbers must be considered a rough range and not a precise risk of having a {CA GENE:62345} mutation.  These models estimate that he has approximately a ***-***% chance of having a mutation. Based on this assessment of his family and personal history, genetic testing {IS/ISNOT:34056} recommended.  ***The Tyrer-Cuzick model is one of multiple prediction models developed to estimate an individual's lifetime risk of developing breast cancer. The Tyrer-Cuzick model is endorsed by the [employer] (NCCN). This model includes many risk factors such as family history, endogenous estrogen  exposure, and benign breast disease. The calculation is highly-dependent on the accuracy of clinical data provided by the patient and can change over time. The Tyrer-Cuzick model may be repeated to reflect new information in her personal or family history in the future.   ***Based on the patient's {Personal/family:20331} history, a statistical model ({GENMODELS:62370}) was used to estimate his risk of developing {CA HX:54794}. This estimates his lifetime risk of developing {CA HX:54794} to be approximately ***%. This estimation does not consider any genetic testing results.  The patient's lifetime breast cancer risk is a preliminary estimate based on available information using one of several models endorsed by the American Cancer Society (ACS). The ACS recommends consideration of breast MRI screening as an adjunct to mammography for patients at high risk (defined as 20% or greater lifetime risk). Please note that a woman's breast cancer risk changes over time. It may increase or decrease based on age and any changes to the personal and/or family medical history. The risks and recommendations listed above apply to this patient at this point in time. In the future, he may or may not be eligible for the same medical management strategies and, in some cases, other medical management strategies may become available to her. If he is interested in an updated breast cancer risk assessment at a later date, he can contact us.  ***Mr. Burdo has been determined to be at high risk for breast cancer.  his Tyrer-Cuzick risk score is ***%.  For women with a greater than 20% lifetime risk of breast cancer, the [employer] (NCCN) recommends the following:  1.      Clinical encounter every 6-12 months to begin when identified as being at increased risk, but not before age 20  2.      Annual mammograms. Tomosynthesis is recommended starting 10 years earlier than the youngest breast cancer diagnosis in  the family or at age 21 (whichever comes first), but not before age 64   67.      Annual breast MRI starting 10 years earlier than the youngest breast cancer diagnosis in the family or at age 42 (whichever comes first), but not before age 25.    PLAN: After considering the risks, benefits, and limitations, Mr. Normoyle provided informed consent to pursue genetic  testing and the blood sample was sent to {Lab} Laboratories for analysis of the {test}. Results should be available within approximately {TAT TIME} weeks' time, at which point they will be disclosed by telephone to Mr. Koskinen, as will any additional recommendations warranted by these results. Mr. Norman will receive a summary of his genetic counseling visit and a copy of his results once available. This information will also be available in Epic.   *** Despite our recommendation, Mr. Tiberio did not wish to pursue genetic testing at today's visit. We understand this decision and remain available to coordinate genetic testing at any time in the future. We, therefore, recommend Mr. Marchena continue to follow the cancer screening guidelines given by his primary healthcare provider.  ***Based on Mr. Portales family history, we recommended his ***, who was diagnosed with *** at age ***, have genetic counseling and testing. Mr. Mamula will let us know if we can be of any assistance in coordinating genetic counseling and/or testing for this family member.   Lastly, we encouraged Mr. Roedl to remain in contact with cancer genetics annually so that we can continuously update the family history and inform him of any changes in cancer genetics and testing that may be of benefit for this family.   Mr. Denner questions were answered to his satisfaction today. Our contact information was provided should additional questions or concerns arise. Thank you for the referral and allowing [place] to share in the care of your patient.   Chanah Tidmore P. Lowell Guitar, MS, CGC Licensed,  Patent attorney [email or web address] phone: [phone]  *** minutes were spent on the date of the encounter in service to the patient including preparation, face-to-face consultation, documentation and care coordination.  *** The patient was seen alone.  ***The patient brought ***. Drs. Meliton Rattan, and/or [place] were available for questions, if needed..    _______________________________________________________________________ For Office Staff:  Number of people involved in session: *** Was an Intern/ student involved with case: {YES/NO:63}

## 2023-04-16 ENCOUNTER — Other Ambulatory Visit: Payer: Self-pay | Admitting: Otolaryngology

## 2023-04-16 DIAGNOSIS — J3801 Paralysis of vocal cords and larynx, unilateral: Secondary | ICD-10-CM | POA: Diagnosis not present

## 2023-04-16 DIAGNOSIS — R490 Dysphonia: Secondary | ICD-10-CM | POA: Diagnosis not present

## 2023-04-17 ENCOUNTER — Encounter (HOSPITAL_COMMUNITY): Payer: Self-pay | Admitting: Otolaryngology

## 2023-04-17 ENCOUNTER — Encounter: Payer: Self-pay | Admitting: Internal Medicine

## 2023-04-17 ENCOUNTER — Ambulatory Visit: Payer: 59 | Attending: Internal Medicine | Admitting: Internal Medicine

## 2023-04-17 ENCOUNTER — Other Ambulatory Visit: Payer: Self-pay

## 2023-04-17 VITALS — BP 120/72 | HR 76 | Ht 71.0 in | Wt 182.0 lb

## 2023-04-17 DIAGNOSIS — I5022 Chronic systolic (congestive) heart failure: Secondary | ICD-10-CM

## 2023-04-17 DIAGNOSIS — I442 Atrioventricular block, complete: Secondary | ICD-10-CM

## 2023-04-17 NOTE — Progress Notes (Signed)
 Anesthesia Chart Review: Same day workup  68 year old male follows with cardiology for history of chronic combined heart failure, EF 30-35% by echo 10/2021, MR s/p bioprosthetic MVR, AF/AFL s/p DCCV,  CHB, CAD s/p CABG 2019.  Recently underwent lead revision/implantation of Medtronic CRT-P and extraction of prior LV pacing lead by Dr. Ladona Ridgel on 10/20/2022.  He was last seen by Dr. Ladona Ridgel on 01/20/2023, Medtronic biventricular pacing system noted to be working well at that time.   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 He presented with metabolic central left upper lobe lung mass invading the mediastinum, small hypermetabolic right hilar lymph nodes, bilateral adrenal gland masses and splenic lesion consistent metastatic disease, hypermetabolic focus in the right gluteus maximus muscle.  He is currently on immunotherapy with Keytruda.  He also has a history of lymphoma and had therapy as well as radiation to the chest for Hodgkin's lymphoma in 2002.  Seen by Dr. Jenne Pane 04/16/2023 for hoarseness.  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 The right vocal cord may be compensating over time, which could explain some improvement in his voice. 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  GlideScope used for intubation 10/20/2022 and 12/02/2022.   CMP and CBC from 04/13/2023 reviewed, unremarkable.  Patient will need day of surgery evaluation.   EKG 01/20/2023: Atrial-sensed ventricular-paced rhythm. Rate 85.   CT chest abdomen pelvis 02/23/2023: IMPRESSION: 1. Diminished size of a paramedian suprahilar left upper lobe mass with some residual soft tissue invasion of the adjacent prevascular space. 2.  Significantly diminished size of bilateral adrenal metastases. 3. Small right hilar lymph node, previously hypermetabolic, not clearly appreciated on today's examination. No persistently enlarged mediastinal, hilar, or axillary lymph nodes. 4. No appreciable CT correlate for previously FDG avid splenic metastasis. 5. No CT correlate for previously FDG avid right gluteal or left diaphragmatic metastases. 6. New small nodules in the dependent right lower lobe measuring 0.3 cm and of the dependent left lung base measuring 0.3 cm, nonspecific and most likely infectious or inflammatory given distribution, although warranting attention on follow-up. 7. Cardiomegaly and coronary artery disease.  TTE 11/01/2021:  1. Left ventricular ejection fraction, by estimation, is 30 to 35%. The  left ventricle has moderately decreased function. The left ventricle  demonstrates global hypokinesis. The left ventricular internal cavity size  was mildly dilated. There is moderate   left ventricular hypertrophy. Left ventricular diastolic function could  not be evaluated.   2. Right ventricular systolic function is low normal. The right  ventricular size is mildly enlarged.   3. Left atrial size was moderately dilated.   4. Right atrial size was mildly dilated.   5. The mitral valve has been repaired/replaced. Trivial mitral valve  regurgitation. The mean mitral valve gradient is 4.0 mmHg. There is a 29  mm Edwards Magna Mitral Bovine present in the mitral position. Procedure  Date: 01/27/18.   6. The aortic valve is tricuspid. There is mild calcification of the  aortic valve. Aortic valve regurgitation is not visualized. Aortic valve  sclerosis is present, with no evidence of aortic valve stenosis.   7. The inferior vena cava is normal in size with greater than 50%  respiratory variability,  suggesting right atrial pressure of 3 mmHg.   Comparison(s): No significant change from prior study. 09/18/20 EF  30-35%.      Zannie Cove [facility] Stay [facility]/Anesthesiology Phone [phone] 04/17/2023 11:43 AM

## 2023-04-17 NOTE — Progress Notes (Signed)
 PCP - [name], MD Cardiologist - Chrystie Nose, MD  PPM/ICD - Pacemaker-Medtronic Device Orders - sent 04/17/22 Rep Notified - Shari Prows e-mailed 04/17/22   Chest x-ray - DOS EKG - 10/21/22 ECHO - 10/20/22 Cardiac Cath - 12/02/17  Aspirin Instructions: Stop ASA as of 3/7  Anesthesia review: Y  Patient verbally denies any shortness of breath, fever, cough and chest pain during phone call   -------------  SDW INSTRUCTIONS given:  Your procedure is scheduled on Monday, March 10th.  Report to [facility] Main Entrance "A" at 0830 A.M., and check in at the Admitting office.  Call this number if you have problems the morning of surgery:  [phone]   Remember:  Do not eat or drink after midnight the night before your surgery   Take these medicines the morning of surgery with A SIP OF WATER  carvedilol (COREG)  levothyroxine (SYNTHROID)    As of today, STOP taking any Aspirin (unless otherwise instructed by your surgeon) Aleve, Naproxen, Ibuprofen, Motrin, Advil, Goody's, BC's, all herbal medications, fish oil, and all vitamins.                      Do not wear jewelry, make up, or nail polish            Do not wear lotions, powders, perfumes/colognes, or deodorant.            Do not shave 48 hours prior to surgery.  Men may shave face and neck.            Do not bring valuables to the hospital.            [facility] is not responsible for any belongings or valuables.  Do NOT Smoke (Tobacco/Vaping) 24 hours prior to your procedure If you use a CPAP at night, you may bring all equipment for your overnight stay.   Contacts, glasses, dentures or bridgework may not be worn into surgery.      For patients admitted to the hospital, discharge time will be determined by your treatment team.   Patients discharged the day of surgery will not be allowed to drive home, and someone needs to stay with them for 24 hours.    Special instructions:   [place]- Preparing For  Surgery  Before surgery, you can play an important role. Because skin is not sterile, your skin needs to be as free of germs as possible. You can reduce the number of germs on your skin by washing with CHG (chlorahexidine gluconate) Soap before surgery.  CHG is an antiseptic cleaner which kills germs and bonds with the skin to continue killing germs even after washing.    Oral Hygiene is also important to reduce your risk of infection.  Remember - BRUSH YOUR TEETH THE MORNING OF SURGERY WITH YOUR REGULAR TOOTHPASTE  Please do not use if you have an allergy to CHG or antibacterial soaps. If your skin becomes reddened/irritated stop using the CHG.  Do not shave (including legs and underarms) for at least 48 hours prior to first CHG shower. It is OK to shave your face.  Please follow these instructions carefully.   Shower the NIGHT BEFORE SURGERY and the MORNING OF SURGERY with DIAL Soap.   Pat yourself dry with a CLEAN TOWEL.  Wear CLEAN PAJAMAS to bed the night before surgery  Place CLEAN SHEETS on your bed the night of your first shower and DO NOT SLEEP WITH PETS.  Day of Surgery: Please shower morning of surgery  Wear Clean/Comfortable clothing the morning of surgery Do not apply any deodorants/lotions.   Remember to brush your teeth WITH YOUR REGULAR TOOTHPASTE.   Questions were answered. Patient verbalized understanding of instructions.

## 2023-04-17 NOTE — Anesthesia Preprocedure Evaluation (Addendum)
 Anesthesia Evaluation  Patient identified by MRN, date of birth, ID band Patient awake    Reviewed: Allergy & Precautions, NPO status , Patient's Chart, lab work & pertinent test results, reviewed documented beta blocker date and time   History of Anesthesia Complications (+) PONV and history of anesthetic complications  Airway Mallampati: III  TM Distance: >3 FB Neck ROM: Full    Dental no notable dental hx.    Pulmonary shortness of breath, neg sleep apnea, neg COPD, neg recent URI, neg PE Dysphonia related to lung cancer   breath sounds clear to auscultation       Cardiovascular Exercise Tolerance: Good hypertension, + CAD and +CHF  (-) Past MI and (-) Cardiac Stents + dysrhythmias + pacemaker  Rhythm:Regular Rate:Normal  Last echocardiogram with 30-35% LVEF, s/p MVR; RV low normal. Functional status similar to prior per his report.    Neuro/Psych neg Seizures    GI/Hepatic ,neg GERD  ,,(+) neg Cirrhosis        Endo/Other  Hypothyroidism    Renal/GU      Musculoskeletal  (+) Arthritis ,    Abdominal   Peds  Hematology   Anesthesia Other Findings   Reproductive/Obstetrics                              Anesthesia Physical Anesthesia Plan  ASA: 3  Anesthesia Plan: General   Post-op Pain Management:    Induction: Intravenous  PONV Risk Score and Plan: 2 and Ondansetron and Dexamethasone  Airway Management Planned: Video Laryngoscope Planned and Oral ETT  Additional Equipment:   Intra-op Plan:   Post-operative Plan: Extubation in OR  Informed Consent: I have reviewed the patients History and Physical, chart, labs and discussed the procedure including the risks, benefits and alternatives for the proposed anesthesia with the patient or authorized representative who has indicated his/her understanding and acceptance.       Plan Discussed with: CRNA  Anesthesia Plan  Comments: (PAT note by Antionette Poles, PA-C:  68 year old male follows with cardiology for history of chronic combined heart failure, EF 30-35% by echo 10/2021, MR s/p bioprosthetic MVR, AF/AFL s/p DCCV,  CHB, CAD s/p CABG 2019.  Recently underwent lead revision/implantation of Medtronic CRT-P and extraction of prior LV pacing lead by Dr. Ladona Ridgel on 10/20/2022.  He was last seen by Dr. Ladona Ridgel on 01/20/2023, Medtronic biventricular pacing system noted to be working well at that time.   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 He presented with metabolic central left upper lobe lung mass invading the mediastinum, small hypermetabolic right hilar lymph nodes, bilateral adrenal gland masses and splenic lesion consistent metastatic disease, hypermetabolic focus in the right gluteus maximus muscle.  He is currently on immunotherapy with Keytruda.  He also has a history of lymphoma and had therapy as well as radiation to the chest for Hodgkin's lymphoma in 2002.  Seen by Dr. Jenne Pane 04/16/2023 for hoarseness.  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 The right vocal cord may be compensating over time, which could explain some improvement in his voice. 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  GlideScope used for intubation  10/20/2022 and 12/02/2022.   CMP and CBC from 04/13/2023 reviewed, unremarkable.  Patient will need day of surgery evaluation.   EKG 01/20/2023: Atrial-sensed ventricular-paced rhythm. Rate 85.   CT chest abdomen pelvis 02/23/2023: IMPRESSION: 1. Diminished size of a paramedian suprahilar left upper lobe mass with some residual soft tissue invasion of the adjacent prevascular space. 2.  Significantly diminished size of bilateral adrenal metastases. 3. Small right hilar lymph node, previously hypermetabolic, not clearly appreciated on today's examination. No persistently enlarged mediastinal, hilar, or axillary lymph nodes. 4. No appreciable CT correlate for previously FDG avid splenic metastasis. 5. No CT correlate for previously FDG avid right gluteal or left diaphragmatic metastases. 6. New small nodules in the dependent right lower lobe measuring 0.3 cm and of the dependent left lung base measuring 0.3 cm, nonspecific and most likely infectious or inflammatory given distribution, although warranting attention on follow-up. 7. Cardiomegaly and coronary artery disease.  TTE 11/01/2021:  1. Left ventricular ejection fraction, by estimation, is 30 to 35%. The  left ventricle has moderately decreased function. The left ventricle  demonstrates global hypokinesis. The left ventricular internal cavity size  was mildly dilated. There is moderate   left ventricular hypertrophy. Left ventricular diastolic function could  not be evaluated.   2. Right ventricular systolic function is low normal. The right  ventricular size is mildly enlarged.   3. Left atrial size was moderately dilated.   4. Right atrial size was mildly dilated.   5. The mitral valve has been repaired/replaced. Trivial mitral valve  regurgitation. The mean mitral valve gradient is 4.0 mmHg. There is a 29  mm Edwards Magna Mitral Bovine present in the mitral position. Procedure  Date: 01/27/18.   6. The aortic valve is tricuspid. There is mild calcification of the  aortic valve. Aortic valve regurgitation is not visualized. Aortic valve  sclerosis is present, with no evidence of aortic valve stenosis.   7. The inferior vena cava is normal in size with greater than 50%  respiratory variability, suggesting right atrial pressure of 3 mmHg.   Comparison(s): No significant change from prior study. 09/18/20 EF  30-35%.     )         Anesthesia Quick Evaluation

## 2023-04-17 NOTE — Patient Instructions (Signed)
 Medication Instructions:  NO CHANGES   Testing/Procedures: Your physician has requested that you have an echocardiogram. Echocardiography is a painless test that uses sound waves to create images of your heart. It provides your doctor with information about the size and shape of your heart and how well your heart's chambers and valves are working. This procedure takes approximately one hour. There are no restrictions for this procedure. Please do NOT wear cologne, perfume, aftershave, or lotions (deodorant is allowed). Please arrive 15 minutes prior to your appointment time.  Please note: We ask at that you not bring children with you during ultrasound (echo/ vascular) testing. Due to room size and safety concerns, children are not allowed in the ultrasound rooms during exams. Our front office staff cannot provide observation of children in our lobby area while testing is being conducted. An adult accompanying a patient to their appointment will only be allowed in the ultrasound room at the discretion of the ultrasound technician under special circumstances. We apologize for any inconvenience.  Follow-Up: At [facility], you and your health needs are our priority.  As part of our continuing mission to provide you with exceptional heart care, we have created designated Provider Care Teams.  These Care Teams include your primary Cardiologist (physician) and Advanced Practice Providers (APPs -  Physician Assistants and Nurse Practitioners) who all work together to provide you with the care you need, when you need it.  We recommend signing up for the patient portal called "MyChart".  Sign up information is provided on this After Visit Summary.  MyChart is used to connect with patients for Virtual Visits (Telemedicine).  Patients are able to view lab/test results, encounter notes, upcoming appointments, etc.  Non-urgent messages can be sent to your provider as well.   To learn more about what you  can do with MyChart, go to [email or web address].    Your next appointment:   6 months with Dr. Rennis Golden ** call in April or May for next appointment  Other Instructions   1st Floor: - Lobby - Registration  - Pharmacy  - Lab - Cafe  2nd Floor: - PV Lab - Diagnostic Testing (echo, CT, nuclear med)  3rd Floor: - Vacant  4th Floor: - TCTS (cardiothoracic surgery) - AFib Clinic - Structural Heart Clinic - Vascular Surgery  - Vascular Ultrasound  5th Floor: - [facility] (general and EP) - Clinical Pharmacy for coumadin, hypertension, lipid, weight-loss medications, and med management appointments    Valet parking services will be available as well.

## 2023-04-17 NOTE — Progress Notes (Signed)
 PERIOPERATIVE PRESCRIPTION FOR IMPLANTED CARDIAC DEVICE PROGRAMMING  Patient Information: Name:  Tyler Hendrix  DOB:  26-Nov-1955  MRN:  [id number]  Planned Procedure:  LARYNGOSCOPY, DIRECT - Bilateral  Surgeon:  Dr. Christia Reading  Date of Procedure:  04/20/22  Cautery will be used.  Position during surgery:  Supine  Device Information:  Clinic EP Physician:  Lewayne Bunting, MD   Device Type:  Pacemaker Manufacturer and Phone #:  Medtronic: [id number] Pacemaker Dependent?:  Yes.   Date of Last Device Check:  01/20/2023 Normal Device Function?:  Yes.    Electrophysiologist's Recommendations:  Have magnet available. Provide continuous ECG monitoring when magnet is used or reprogramming is to be performed.  Procedure may interfere with device function.  Magnet should be placed over device during procedure.  Per Device Clinic Standing Orders, Wiliam Ke, RN  12:46 PM 04/17/2023

## 2023-04-17 NOTE — Progress Notes (Signed)
 OFFICE NOTE  Chief Complaint:  No complaints  Primary Care Physician: [name], MD  HPI:  Tyler Hendrix is a pleasant 68 year old male who was previously seen by Dr. Elease Hashimoto in September of this past year. He is an add on to my schedule today for acute symptoms of palpitations. He reports over the past several days she's had skipped heartbeats and higher heart rates. He says his heart rate is typically in the 60s however recently has been in the 80s. He's also felt some skipped beats and they were happening 4-5 times a minute. He denies any chest pain or worsening shortness of breath. He's never had a stress test or any coronary disease evaluation. He does have a history of Hodgkin's lymphoma and had chest wall radiation. In September he had an echocardiogram which showed normal systolic function and it was felt that he was doing fairly well. He continued to do exercise which she has recently without any significant symptoms. 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 He is a caffeine user drinking 2-3 cups of coffee a day.   [name] returns today for follow-up. Overall he is feeling fairly well. He reports his palpitations have improved somewhat but he still feeling him. While wearing the monitor he was noted to have significant ventricular ectopy including isolated PVCs, and ventricular trigeminy and quadrigeminy. His nuclear stress test was negative for ischemia but was not gated. He did have an echo last fall which showed an EF of 55% which is reassuring.   At the pleasure see [name] back in the office today for follow-up. Overall he thinks he is doing much better on the high-dose of metoprolol 25 mg daily. Although he's tolerating this with very few palpitations, he still has some breakthrough in the afternoon. He generally takes the medicine in the morning. His EKG today however does show a low heart rate of 48, with a short  period to sinus rhythm and a competing junctional rhythm. I've had concerns about underlying conduction system disease based on his EKG showing a right bundle branch block and now there is some evidence of a junctional bradycardia. Despite this he has a good energy level, denies chest pain and is able to maintain normal physical activity. His wife recently had their new baby about 2 weeks ago.  06/22/2015  [name] returns today for follow-up. He reports that he is overall done fairly well. He denies any worsening shortness of breath or fatigue. Heart rate remains in the upper 40s although he has very little ventricular ectopy and his EKG today. He denies any significant palpitations. I think we found a balance currently between low-dose beta blocker and his underlying conduction disease. Should he have more symptoms however he may need antiarrhythmic medication. I do not see any features that are concerning for pacemaker at this point. Blood pressure, however remains elevated and not at goal.  10/04/2015  [name] was seen back today in follow-up. He reports a marked improvement in his PVCs however over the past week he's felt slightly more short of breath with exertion. He did not voluntarily mention this yet required significant persistent questioning on my part to get an answer out of him. He also said that he might have a little pressure in his chest when he tried to exercise. He denied any pain, dizziness, presyncope or syncopal symptoms. Blood pressure is improved somewhat after the addition  of amlodipine. He's no longer having any PVCs however his EKG is abnormal today showing a new complete heart block. Heart rate is 44 with a sinus rhythm and wide QRS. There are inferior T-wave abnormalities which were previously seen on his EKG.  10/05/2015  [name] was seen today in follow-up of complete heart block. This was noted yesterday during his office visit. Heart rate was in the 40s and he was  somewhat diaphoretic. He denied any chest pain or shortness of breath but has felt fatigue recently. He had taken Toprol XL the night before and I advised him to not take any last evening and allow the medicine to wash out of his system. A today he is a heart rate has improved up to 88 and is in sinus rhythm with first-degree AV block and bifascicular block. He thinks he actually feels a little better today although was not diaphoretic. He said he didn't sleep well last night and had some diarrhea this morning. He does not noted change in energy level although he feels that he's had some recurrent palpitations.  01/01/2016  [name] returns today for follow-up. He underwent placement of a pacemaker which was uneventful for complete heart block. He says since that time his color is improved energy is better. Heart rate today is 61 however the pacer is set at a backup rate of 50. He was concerned that this may be still causing a little fatigue but I reassured him that I think that his heart rate is over that number and unlikely to be the cause. Blood pressure is well-controlled. The pacemaker site looks appropriate without any hematoma or effusion. He was recently seen by Azalee Course, PA-C , and was noted to have an elevated TSH. This is been elevated in the past but was as high as 9. Free T4 was ordered and those labs are pending. He did have radiation to his thyroid as part of treatment for Hodgkin's lymphoma.  04/18/2015  [name] was seen today in follow-up. Overall he seems to be doing very well. After discharge his amlodipine was increased to 10 mg from 5 mg. I rechecked his blood pressure today was 104/48, and he has noted that occasionally he gets a flushed feeling when exercising. I suspect he may be on too much medicine. We did discuss that amlodipine although does have a benefit for controlling blood pressure, has no intrinsic cardiovascular benefit. He does have normal renal function would probably  benefit from being on an ACE or ARB. He denies any chest pain or worsening shortness of breath. No problems with his pacemaker. He has been seeing an endocrinologist and his thyroid function has normalized on low-dose levothyroxine.  10/17/2016  Mr. Rachels returns today for follow-up. He continues to do well. Recently he had his valsartan and switch to [place] losartan. He was feeling a little fatigue any decrease the dose from 20 mg to 10 mg daily. Blood pressure he said was running low for him. Today seems to be normal at 112/68. He is going to see his primary care provider about retesting of his testosterone. Apparently in the past this is been low as a result of chemotherapy and radiation. 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 He denies any chest pain. He's had no presyncope or syncopal symptoms. His pacemaker is been functioning appropriately on remote checks. This now almost one year since his two-vessel PCI.  04/24/2017  Mr. Chiaramonte returns today for follow-up.  Overall he is doing well.  He denies any chest pain or worsening shortness of breath.  Blood pressure was elevated today however came down to 128/56.  He remains a heart rate of 50 which is his backup pacer setting.  He is in complete heart block and testing is indicated that he is pacemaker dependent.  He does do remote checks.  He had two-vessel coronary artery disease and has been asymptomatic with that.  He is on aspirin and high-dose atorvastatin.  He has a physical exam coming up in a few weeks with his primary care provider who will get cholesterol testing at that time.  He is also on olmesartan for blood pressure.  04/12/2018  Mr. Holzheimer is seen today in follow-up.  He had a very eventful last several months.  As above, he has a history of heart block with permanent pacemaker placement in 2017, CAD status post multivessel PCI in 2017, chronic combined  systolic and diastolic heart failure, status post bioprosthetic MVR and CABG x2 in December 2019, and new onset atrial fibrillation status post cardioversion on 03/26/2018.  He again presented recently with worsening shortness of breath and tachycardia and elective cardioversion was recommended.  In addition he was found to be in significant heart failure and was heavily diuresed.  Since discharge his weight has been down although he did gain back a few pounds.  Despite this, he has no evidence of any heart failure today.  In fact recently he has been getting lightheaded and dizzy and it was advised that he discontinue Lasix and potassium.  He is on Entresto.  His LVEF had decreased to 25% from the mid 40% range.  He is also on warfarin which was started initially for his mitral valve surgery, but has been continued because of A. fib.  There is a question of whether he has valvular A. Fib.  03/20/2019  Mr. Gillyard returns today for follow-up.  He reports feeling much better.  He underwent biventricular upgrade of his device in December and since then has been doing better with improved energy and less heart failure symptoms.  Blood pressure today is well controlled 130/71.  He reports his weight is stable.  We had discussed uptitrating his Sherryll Burger however when I suggested we may consider that today he did not want to change his medicines.  His INR has been therapeutic on warfarin.  We discussed whether he possibly could come off of this since his A. fib was post CABG.  He also is being monitored with his device.  I recommended deferring this decision to Dr.Allred who is the A. fib expert when he follows up with him in April.  In addition we discussed a follow-up echo which should occur prior to that visit.  10/13/2019  Mr. Demeyer is seen today in follow-up.  Overall he seems to be doing well although reports some fatiguing with exercise.  He says he has been seen by Dr. Johney Frame in just last week by Gypsy Balsam,  NP for reevaluation of his biventricular pacer.  He noted that his heart rate is not increasing significantly with exertion.  Apparently the device could have a setting enabled which would allow more of that.  He was not sure why that was not programmed initially.  He has not really done a lot of exercise or activity to test his heart rate but is interested to see how he feels with that.  His LVEF had improved  somewhat up to 40% after biventricular upgrade however it was felt that this was too early to recheck it and is due for a repeat echo in October.  Also recently he thought that he was having some side effects including issues with memory or brain fog on a statin.  He had stopped it about a week ago.  He was on 80 mg of atorvastatin however his LDL was well controlled at 50.  05/17/2020  Mr. Minch returns today for follow-up.  Overall he says he is doing really well.  He did have repeat lipids recently showing total cholesterol 109, triglycerides 104, HDL 32 and LDL 57.  He is seeing his endocrinologist tomorrow and has an appoint with his PCP next week.  He continues to work but says he may retire within the next year.  Denies any chest pain or worsening shortness of breath.  His remote pacer checks have been stable.  He is in a paced rhythm today.  Blood pressure is on the low normal range.  There is really not room to uptitrate his Entresto.  12/26/2021  Mr. Zalar returns today for follow-up.  He says he is fairly stable.  He has not had any worsening shortness of breath or chest pain.  He recently saw Dr. Ladona Ridgel.  There was some discussion about biventricular pacing.  He is left-sided lead is disabled and it is noted that it was positioned more toward the posterior RV apex.  He did not feel that this would give him adequate improvement in LV function.  It sounds like they discussed the possibility of revising the leads to allow for his bundle pacing.  He was somewhat skeptical about this due to  risks of the procedure however there is a good chance that it could improve his LV function.  This would have certainly improved his quality of life as well.  04/17/2023  Mr. Tseng returns today for follow-up.  More recently saw Dr. Ladona Ridgel.  He had repositioned cardiac lead.  He did have an intraoperative TEE back in September.  LVEF at that time was 30 to 35% with global hypokinesis.  Since then over the past year it has been very difficult for him.  It started initially with an accident where he broke his left femur.  He underwent surgery but subsequently developed progressive cough and shortness of breath.  He was thought to have a possible pneumonia around the time of his pacemaker lead revision and ultimately CT scan of the chest was performed which showed probable lung cancer with metastases.  He underwent a bronchoscopy but sample was not conclusive and then he had a biopsy of his adrenal gland where there was probable metastases based on a PET scan and this indicated a poorly differentiated non-small cell lung cancer.  He did have high microsatellite instability and ultimately has been started on Keytruda.  Per his report there has been some significant improvement in his cancer based on a scan in January.  He understands that this is incurable.  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  He is scheduled for laryngoscopy on Monday with Dr. Jenne Pane.  PMHx:  Past Medical History:  Diagnosis Date   Abnormal TSH    Arthritis    ddd   Atrial fibrillation and flutter (HCC)    s/p DCCV 03/26/2018   CAD in native artery    a. 11/2015: s/p 2 vessel PCI of the first  diagonal branch of the LAD in the mid left circumflex with DES. // s/p CABG 01/2018   Chronic combined systolic and diastolic CHF (congestive heart failure) (HCC)    sees Dr. Johney Frame    Complete heart block (HCC)    a. s/p Medtronic ppm 10/2015.   Dyspnea    with exertion    Family history of bladder cancer    Family history of prostate cancer    Family history of stomach cancer    History of radiation therapy 2002   Hodgkin disease (HCC) 2002   a. s/p chest radiation and chemotherapy   Hypertension    Hypothyroidism    sees Dr. 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   29 mm Edwards [facility] Mitral stented bovine pericardial tissue valve    Past Surgical History:  Procedure Laterality Date   A-FLUTTER ABLATION N/A 06/20/2021   Procedure: A-FLUTTER ABLATION;  Surgeon: Marinus Maw, MD;  Location: [facility] INVASIVE CV LAB;  Service: Cardiovascular;  Laterality: N/A;   BIV UPGRADE N/A 02/08/2019   Procedure: UPGRADE TO BIV PPM;  Surgeon: Hillis Range, MD;  Location: [facility] INVASIVE CV LAB;  Service: Cardiovascular;  Laterality: N/A;   BRONCHIAL BIOPSY  12/02/2022   Procedure: BRONCHIAL BIOPSIES;  Surgeon: Leslye Peer, MD;  Location: [facility] ENDOSCOPY;  Service: Pulmonary;;   BRONCHIAL BRUSHINGS  12/02/2022   Procedure: BRONCHIAL BRUSHINGS;  Surgeon: Leslye Peer, MD;  Location: [facility] ENDOSCOPY;  Service: Pulmonary;;   BRONCHIAL NEEDLE ASPIRATION BIOPSY  12/02/2022   Procedure: BRONCHIAL NEEDLE ASPIRATION BIOPSIES;  Surgeon: Leslye Peer, MD;  Location: [facility] ENDOSCOPY;  Service: Pulmonary;;   BRONCHIAL WASHINGS  12/02/2022   Procedure: BRONCHIAL WASHINGS;  Surgeon: Leslye Peer, MD;  Location: [facility] ENDOSCOPY;  Service: Pulmonary;;   CARDIAC CATHETERIZATION N/A 11/02/2015   Procedure: Left Heart Cath and  Coronary Angiography;  Surgeon: Tonny Bollman, MD;  Location: [facility] INVASIVE CV LAB;  Service: Cardiovascular;  Laterality: N/A;   CARDIAC CATHETERIZATION N/A 11/21/2015   Procedure: Coronary Stent Intervention;  Surgeon: Tonny Bollman, MD;  Location: [facility] INVASIVE CV LAB;  Service: Cardiovascular;  Laterality: N/A;   CARDIAC CATHETERIZATION  11/2017   CARDIOVERSION N/A 03/26/2018   Procedure: CARDIOVERSION;  Surgeon: Parke Poisson, MD;  Location: [facility] ENDOSCOPY;  Service: Cardiovascular;  Laterality: N/A;   COLONOSCOPY  01/16/2015   per Dr. Christella Hartigan, benign polyps, repeat in 10 yrs    CORONARY ARTERY BYPASS GRAFT N/A 01/27/2018   Procedure: CORONARY ARTERY BYPASS GRAFTING (CABG) x two, using left internal mammary artery and right leg greater saphenous vein harvested endoscopically;  Surgeon: Purcell Nails, MD;  Location[facility] OR;  Service: Open Heart Surgery;  Laterality: N/A;   EP IMPLANTABLE DEVICE N/A 11/02/2015   MDT Adivisa MRI conditional dual chamber pacemaker implanted by Dr Johney Frame for complete heart block   FEMUR IM NAIL Left 07/01/2022   Procedure: INTRAMEDULLARY (IM) NAIL FEMORAL LEFT ANTEGRADE;  Surgeon: Myrene Galas, MD;  Location: [facility] OR;  Service: Orthopedics;  Laterality: Left;   IR IMAGING GUIDED PORT INSERTION  01/02/2023   KNEE SURGERY Right 1972   LEAD EXTRACTION N/A 10/20/2022   Procedure: LEAD EXTRACTION;  Surgeon: Marinus Maw, MD;  Location: [facility] INVASIVE CV LAB;  Service: Cardiovascular;  Laterality: N/A;   LUMBAR LAMINECTOMY  1996   MITRAL VALVE REPLACEMENT N/A 01/27/2018   Procedure: MITRAL VALVE (MV) REPLACEMENT using Magna Mitral Ease Valve Size ;  Surgeon: Purcell Nails, MD;  Location[facility] OR;  Service: Open Heart Surgery;  Laterality: N/A;   RIGHT/LEFT HEART CATH AND CORONARY ANGIOGRAPHY N/A 12/02/2017   Procedure: RIGHT/LEFT HEART CATH AND CORONARY ANGIOGRAPHY;  Surgeon: Corky Crafts, MD;  Location: [facility] INVASIVE CV LAB;  Service: Cardiovascular;   Laterality: N/A;   TEE WITHOUT CARDIOVERSION N/A 12/09/2017   Procedure: TRANSESOPHAGEAL ECHOCARDIOGRAM (TEE);  Surgeon: Jodelle Red, MD;  Location: [facility] ENDOSCOPY;  Service: Cardiovascular;  Laterality: N/A;   TEE WITHOUT CARDIOVERSION N/A 01/27/2018   Procedure: TRANSESOPHAGEAL ECHOCARDIOGRAM (TEE);  Surgeon: Purcell Nails, MD;  Location: [facility] OR;  Service: Open Heart Surgery;  Laterality: N/A;   VIDEO BRONCHOSCOPY WITH RADIAL ENDOBRONCHIAL ULTRASOUND  12/02/2022   Procedure: VIDEO BRONCHOSCOPY WITH RADIAL ENDOBRONCHIAL ULTRASOUND;  Surgeon: Leslye Peer, MD;  Location: [facility] ENDOSCOPY;  Service: Pulmonary;;    FAMHx:  Family History  Problem Relation Age of Onset   Heart disease Mother    Heart disease Father    Prostate cancer Father 75   Bladder Cancer Father 28   Stomach cancer Paternal Aunt    Colon cancer Neg Hx     SOCHx:   reports that he has never smoked. He has never used smokeless tobacco. He reports current alcohol use of about 3.0 standard drinks of alcohol per week. He reports that he does not use drugs.  ALLERGIES:  Allergies  Allergen Reactions   Other     Pt reported allergic to dust mites that causes of sneezing, runny nose    ROS: Pertinent items noted in HPI and remainder of comprehensive ROS otherwise negative.  HOME MEDS: Current Outpatient Medications  Medication Sig Dispense Refill   aspirin EC 81 MG tablet Take 81 mg by mouth daily.     atorvastatin (LIPITOR) 40 MG tablet Take 1 tablet (40 mg total) by mouth daily. 90 tablet 3   carvedilol (COREG) 6.25 MG tablet Take 1 tablet (6.25 mg total) by mouth 2 (two) times daily with a meal. 180 tablet 3   Cholecalciferol 125 MCG (5000 UT) TABS Take 1 tablet by mouth daily. 30 tablet 6   levothyroxine (SYNTHROID) 75 MCG tablet Take 1 tablet (75 mcg total) by mouth daily before breakfast. 90 tablet 3   sacubitril-valsartan (ENTRESTO) 24-26 MG Take 1 tablet by mouth 2 (two) times daily. 180 tablet 3    spironolactone (ALDACTONE) 25 MG tablet Take one-half tablet (12.5 mg total) by mouth daily. 45 tablet 3   traZODone (DESYREL) 50 MG tablet Take 1 tablet (50 mg total) by mouth at bedtime as needed for sleep. 90 tablet 1   albuterol (VENTOLIN HFA) 108 (90 Base) MCG/ACT inhaler Inhale 1-2 puffs into the lungs every 6 (six) hours as needed for wheezing or shortness of breath. (Patient not taking: Reported on 04/17/2023) 8 g 2   sildenafil (VIAGRA) 50 MG tablet Take 1 tablet (50 mg total) by mouth daily as needed for erectile dysfunction. (Patient not taking: Reported on 04/17/2023) 30 tablet 3   No current facility-administered medications for  this visit.    LABS/IMAGING: No results found for this or any previous visit (from the past 48 hours).  No results found.  WEIGHTS: Wt Readings from Last 3 Encounters:  04/17/23 182 lb (82.6 kg)  04/13/23 181 lb (82.1 kg)  03/23/23 181 lb 9.6 oz (82.4 kg)    VITALS: BP 120/72 (BP Location: Left Arm, Patient Position: Sitting, Cuff Size: Normal)   Pulse 76   Ht 5\' 11"  (1.803 m)   Wt 182 lb (82.6 kg)   SpO2 98%   BMI 25.38 kg/m   EXAM: General appearance: alert and no distress Neck: no carotid bruit, no JVD and thyroid not enlarged, symmetric, no tenderness/mass/nodules Lungs: clear to auscultation bilaterally Heart: regular rate and rhythm, S1, S2 normal, no murmur, click, rub or gallop Abdomen: soft, non-tender; bowel sounds normal; no masses,  no organomegaly Extremities: extremities normal, atraumatic, no cyanosis or edema Pulses: 2+ and symmetric Skin: Skin color, texture, turgor normal. No rashes or lesions Neurologic: Grossly normal Psych: Pleasant  EKG: Deferred  ASSESSMENT: CAD - s/p PCI to the D1 and mid LCX with DES (11/2015) Status post CABG x2 (LIMA to LAD, SVG to RCA) and bioprosthetic MVR -29 mm Edwards magna bovine bioprosthesis (01/2018) Complete heart block-s/p Medtronic pacemaker (10/2015) with CRT-P upgrade   (01/2019) Acute on chronic systolic congestive heart failure with EF as low as 25% (2019), now 30 to 35% (10/2021) Palpitations - PVCs including trigeminy and quadrigeminy, generally improved on b-blocker Abnormal EKG with bifascicular block and competing junctional bradycardia History of chest wall radiation for Hodgkin's lymphoma Essential hypertension Hypothyroidism Paroxysmal atrial fibrillation -on aspirin 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   Mr. Abello has unfortunately been diagnosed with a metastatic non-small cell lung cancer.  This is considered incurable however he is on Keytruda and has had some recent improvement in his CT findings.  He continues to have a good attitude regarding this.  He reports he is still active and exercising with no significant shortness of breath.  He is hopeful to see if there has been some improvement in LV function with echo and I will go ahead and obtain that now.  From a cardiac standpoint he would be okay to proceed with laryngoscopy which is a low risk procedure.  This is already scheduled on Monday.  Will continue our current therapies at this time.  His device apparently is working appropriately as seen by Dr. Ladona Ridgel fairly recently.  Plan close follow-up in 6 months or sooner as necessary.  Chrystie Nose, MD, [facility], FACP  [place]  [facility]  Medical Director of the [facility] Lipid Disorders &  Cardiovascular Risk Reduction Clinic Diplomate of the American Board of Clinical Lipidology Attending Cardiologist  Direct Dial: [phone]  Fax: [phone]  Website:  [email or web address] Tanasia Budzinski 04/17/2023, 3:05 PM

## 2023-04-20 ENCOUNTER — Ambulatory Visit (HOSPITAL_COMMUNITY): Payer: Self-pay | Admitting: Physician Assistant

## 2023-04-20 ENCOUNTER — Encounter (HOSPITAL_COMMUNITY): Payer: Self-pay | Admitting: Otolaryngology

## 2023-04-20 ENCOUNTER — Other Ambulatory Visit (HOSPITAL_COMMUNITY): Payer: Self-pay

## 2023-04-20 ENCOUNTER — Other Ambulatory Visit: Payer: Self-pay

## 2023-04-20 ENCOUNTER — Ambulatory Visit (HOSPITAL_COMMUNITY)
Admission: RE | Admit: 2023-04-20 | Discharge: 2023-04-20 | Disposition: A | Discharge status: Home / Self Care | Attending: Otolaryngology | Admitting: Otolaryngology

## 2023-04-20 ENCOUNTER — Encounter (HOSPITAL_COMMUNITY)
Admission: RE | Disposition: A | Discharge status: Home / Self Care | Payer: Self-pay | Source: Home / Self Care | Attending: Otolaryngology

## 2023-04-20 DIAGNOSIS — Z923 Personal history of irradiation: Secondary | ICD-10-CM | POA: Insufficient documentation

## 2023-04-20 DIAGNOSIS — J3801 Paralysis of vocal cords and larynx, unilateral: Secondary | ICD-10-CM | POA: Diagnosis not present

## 2023-04-20 DIAGNOSIS — I2510 Atherosclerotic heart disease of native coronary artery without angina pectoris: Secondary | ICD-10-CM | POA: Insufficient documentation

## 2023-04-20 DIAGNOSIS — Z8571 Personal history of Hodgkin lymphoma: Secondary | ICD-10-CM | POA: Diagnosis not present

## 2023-04-20 DIAGNOSIS — Z8249 Family history of ischemic heart disease and other diseases of the circulatory system: Secondary | ICD-10-CM | POA: Diagnosis not present

## 2023-04-20 DIAGNOSIS — M1991 Primary osteoarthritis, unspecified site: Secondary | ICD-10-CM | POA: Diagnosis not present

## 2023-04-20 DIAGNOSIS — I480 Paroxysmal atrial fibrillation: Secondary | ICD-10-CM | POA: Diagnosis not present

## 2023-04-20 DIAGNOSIS — R490 Dysphonia: Secondary | ICD-10-CM

## 2023-04-20 DIAGNOSIS — J3800 Paralysis of vocal cords and larynx, unspecified: Secondary | ICD-10-CM | POA: Diagnosis not present

## 2023-04-20 DIAGNOSIS — Z9221 Personal history of antineoplastic chemotherapy: Secondary | ICD-10-CM | POA: Insufficient documentation

## 2023-04-20 DIAGNOSIS — I5042 Chronic combined systolic (congestive) and diastolic (congestive) heart failure: Secondary | ICD-10-CM | POA: Insufficient documentation

## 2023-04-20 DIAGNOSIS — C3492 Malignant neoplasm of unspecified part of left bronchus or lung: Secondary | ICD-10-CM | POA: Insufficient documentation

## 2023-04-20 DIAGNOSIS — Z953 Presence of xenogenic heart valve: Secondary | ICD-10-CM | POA: Diagnosis not present

## 2023-04-20 DIAGNOSIS — E039 Hypothyroidism, unspecified: Secondary | ICD-10-CM | POA: Diagnosis not present

## 2023-04-20 DIAGNOSIS — I110 Hypertensive heart disease with heart failure: Secondary | ICD-10-CM | POA: Diagnosis not present

## 2023-04-20 DIAGNOSIS — Z951 Presence of aortocoronary bypass graft: Secondary | ICD-10-CM | POA: Diagnosis not present

## 2023-04-20 DIAGNOSIS — I5043 Acute on chronic combined systolic (congestive) and diastolic (congestive) heart failure: Secondary | ICD-10-CM | POA: Diagnosis not present

## 2023-04-20 HISTORY — PX: DIRECT LARYNGOSCOPY: SHX5326

## 2023-04-20 SURGERY — LARYNGOSCOPY, DIRECT
Anesthesia: General | Laterality: Bilateral

## 2023-04-20 MED ORDER — CHLORHEXIDINE GLUCONATE 0.12 % MT SOLN
15.0000 mL | Freq: Once | OROMUCOSAL | Status: AC
  Administered 2023-04-20: 15 mL via OROMUCOSAL
  Filled 2023-04-20: qty 15

## 2023-04-20 MED ORDER — SUGAMMADEX SODIUM 200 MG/2ML IV SOLN
INTRAVENOUS | Status: AC
  Filled 2023-04-20: qty 2

## 2023-04-20 MED ORDER — LACTATED RINGERS IV SOLN: INTRAVENOUS | Status: DC

## 2023-04-20 MED ORDER — FENTANYL CITRATE (PF) 250 MCG/5ML IJ SOLN
INTRAMUSCULAR | Status: AC
  Filled 2023-04-20: qty 5

## 2023-04-20 MED ORDER — MIDAZOLAM HCL 2 MG/2ML IJ SOLN
INTRAMUSCULAR | Status: AC
  Filled 2023-04-20: qty 2

## 2023-04-20 MED ORDER — SUGAMMADEX SODIUM 200 MG/2ML IV SOLN
INTRAVENOUS | Status: DC | PRN
  Administered 2023-04-20: 200 mg via INTRAVENOUS

## 2023-04-20 MED ORDER — EPINEPHRINE HCL (NASAL) 0.1 % NA SOLN
NASAL | Status: AC
  Filled 2023-04-20: qty 30

## 2023-04-20 MED ORDER — TRIAMCINOLONE ACETONIDE 40 MG/ML IJ SUSP
INTRAMUSCULAR | Status: AC
  Filled 2023-04-20: qty 5

## 2023-04-20 MED ORDER — ROCURONIUM BROMIDE 10 MG/ML (PF) SYRINGE
PREFILLED_SYRINGE | INTRAVENOUS | Status: DC | PRN
  Administered 2023-04-20: 70 mg via INTRAVENOUS

## 2023-04-20 MED ORDER — ONDANSETRON HCL 4 MG/2ML IJ SOLN
INTRAMUSCULAR | Status: AC
  Filled 2023-04-20: qty 2

## 2023-04-20 MED ORDER — ORAL CARE MOUTH RINSE: 15.0000 mL | Freq: Once | OROMUCOSAL | Status: AC

## 2023-04-20 MED ORDER — LIDOCAINE 2% (20 MG/ML) 5 ML SYRINGE
INTRAMUSCULAR | Status: DC | PRN
Start: 2023-04-20 — End: 2023-04-20
  Administered 2023-04-20: 100 mg via INTRAVENOUS

## 2023-04-20 MED ORDER — ONDANSETRON HCL 4 MG/2ML IJ SOLN
INTRAMUSCULAR | Status: DC | PRN
  Administered 2023-04-20: 4 mg via INTRAVENOUS

## 2023-04-20 MED ORDER — PROPOFOL 10 MG/ML IV BOLUS
INTRAVENOUS | Status: DC | PRN
  Administered 2023-04-20: 80 mg via INTRAVENOUS

## 2023-04-20 MED ORDER — FENTANYL CITRATE (PF) 250 MCG/5ML IJ SOLN
INTRAMUSCULAR | Status: DC | PRN
  Administered 2023-04-20: 100 ug via INTRAVENOUS

## 2023-04-20 MED ORDER — MIDAZOLAM HCL 2 MG/2ML IJ SOLN
INTRAMUSCULAR | Status: DC | PRN
  Administered 2023-04-20: 2 mg via INTRAVENOUS

## 2023-04-20 MED ORDER — PROPOFOL 500 MG/50ML IV EMUL
INTRAVENOUS | Status: DC | PRN
  Administered 2023-04-20: 150 ug/kg/min via INTRAVENOUS

## 2023-04-20 MED ORDER — 0.9 % SODIUM CHLORIDE (POUR BTL) OPTIME
TOPICAL | Status: DC | PRN
  Administered 2023-04-20: 1000 mL

## 2023-04-20 MED ORDER — DEXAMETHASONE SODIUM PHOSPHATE 10 MG/ML IJ SOLN
INTRAMUSCULAR | Status: DC | PRN
  Administered 2023-04-20: 10 mg via INTRAVENOUS

## 2023-04-20 SURGICAL SUPPLY — 17 items
CANISTER SUCT 3000ML PPV (MISCELLANEOUS) ×1 IMPLANT
COVER BACK TABLE 60X90IN (DRAPES) ×1 IMPLANT
COVER MAYO STAND STRL (DRAPES) ×1 IMPLANT
DRAPE HALF SHEET 40X57 (DRAPES) ×1 IMPLANT
GAUZE 4X4 16PLY ~~LOC~~+RFID DBL (SPONGE) ×1 IMPLANT
GLOVE BIO SURGEON STRL SZ7.5 (GLOVE) ×1 IMPLANT
GOWN STRL REUS W/ TWL LRG LVL3 (GOWN DISPOSABLE) IMPLANT
GUARD TEETH (MISCELLANEOUS) ×1 IMPLANT
KIT PROLARN PLUS GEL W/NDL (Prosthesis and Implant ENT) IMPLANT
KIT TURNOVER KIT B (KITS) ×1 IMPLANT
NS IRRIG 1000ML POUR BTL (IV SOLUTION) ×1 IMPLANT
PAD ARMBOARD 7.5X6 YLW CONV (MISCELLANEOUS) ×2 IMPLANT
PATTIES SURGICAL .5 X3 (DISPOSABLE) IMPLANT
POSITIONER HEAD DONUT 9IN (MISCELLANEOUS) IMPLANT
SOL ANTI FOG 6CC (MISCELLANEOUS) IMPLANT
TOWEL GREEN STERILE FF (TOWEL DISPOSABLE) ×2 IMPLANT
TUBE CONNECTING 12X1/4 (SUCTIONS) ×1 IMPLANT

## 2023-04-20 NOTE — H&P (Signed)
 Tyler Hendrix is an 68 y.o. male.   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  Past Medical History:  Diagnosis Date   Abnormal TSH    Arthritis    ddd   Atrial fibrillation and flutter (HCC)    s/p DCCV 03/26/2018   CAD in native artery    a. 11/2015: s/p 2 vessel PCI of the first diagonal branch of the LAD in the mid left circumflex with DES. // s/p CABG 01/2018   Chronic combined systolic and diastolic CHF (congestive heart failure) (HCC)    sees Dr. Johney Frame    Complete heart block (HCC)    a. s/p Medtronic ppm 10/2015.   Dyspnea    with exertion   Family history of bladder cancer    Family history of prostate cancer    Family history of stomach cancer    History of radiation therapy 2002   Hodgkin disease (HCC) 2002   a. s/p chest radiation and chemotherapy   Hypertension    Hypothyroidism    sees Dr. 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   29 mm [facility] Mitral stented bovine pericardial tissue valve    Past Surgical History:  Procedure Laterality Date   A-FLUTTER ABLATION N/A 06/20/2021   Procedure: A-FLUTTER ABLATION;  Surgeon: Marinus Maw, MD;  Location: [facility] INVASIVE CV LAB;  Service: Cardiovascular;  Laterality: N/A;   BIV UPGRADE N/A 02/08/2019   Procedure: UPGRADE TO BIV PPM;  Surgeon: Hillis Range, MD;  Location: [facility]VAS[facility] CV LAB;  Service: Cardiovascular;  Laterality: N/A;    BRONCHIAL BIOPSY  12/02/2022   Procedure: BRONCHIAL BIOPSIES;  Surgeon: Leslye Peer, MD;  Location: [facility] ENDOSCOPY;  Service: Pulmonary;;   BRONCHIAL BRUSHINGS  12/02/2022   Procedure: BRONCHIAL BRUSHINGS;  Surgeon: Leslye Peer, MD;  Location: [facility] ENDOSCOPY;  Service: Pulmonary;;   BRONCHIAL NEEDLE ASPIRATION BIOPSY  12/02/2022   Procedure: BRONCHIAL NEEDLE ASPIRATION BIOPSIES;  Surgeon: Leslye Peer, MD;  Location: [facility] ENDOSCOPY;  Service: Pulmonary;;   BRONCHIAL WASHINGS  12/02/2022   Procedure: BRONCHIAL WASHINGS;  Surgeon: Leslye Peer, MD;  Location: [facility] ENDOSCOPY;  Service: Pulmonary;;   CARDIAC CATHETERIZATION N/A 11/02/2015   Procedure: Left Heart Cath and Coronary Angiography;  Surgeon: Tonny Bollman, MD;  Location: [facility] INVASIVE CV LAB;  Service: Cardiovascular;  Laterality: N/A;   CARDIAC CATHETERIZATION N/A 11/21/2015   Procedure: Coronary Stent Intervention;  Surgeon: Tonny Bollman, MD;  Location: [facility] INVASIVE CV LAB;  Service: Cardiovascular;  Laterality: N/A;   CARDIAC CATHETERIZATION  11/2017   CARDIOVERSION N/A 03/26/2018   Procedure: CARDIOVERSION;  Surgeon: Parke Poisson, MD;  Location: [facility] ENDOSCOPY;  Service: Cardiovascular;  Laterality: N/A;   COLONOSCOPY  01/16/2015   per Dr. Christella Hartigan, benign polyps, repeat in 10 yrs    CORONARY ARTERY BYPASS GRAFT N/A 01/27/2018   Procedure: CORONARY ARTERY  BYPASS GRAFTING (CABG) x two, using left internal mammary artery and right leg greater saphenous vein harvested endoscopically;  Surgeon: Purcell Nails, MD;  Location: [facility] OR;  Service: Open Heart Surgery;  Laterality: N/A;   EP IMPLANTABLE DEVICE N/A 11/02/2015   MDT Adivisa MRI conditional dual chamber pacemaker implanted by Dr Johney Frame for complete heart block   FEMUR IM NAIL Left 07/01/2022   Procedure: INTRAMEDULLARY (IM) NAIL FEMORAL LEFT ANTEGRADE;  Surgeon: Myrene Galas, MD;  Location: [facility] OR;  Service: Orthopedics;  Laterality: Left;   IR IMAGING GUIDED PORT INSERTION   01/02/2023   KNEE SURGERY Right 1972   LEAD EXTRACTION N/A 10/20/2022   Procedure: LEAD EXTRACTION;  Surgeon: Marinus Maw, MD;  Location: [facility]VAS[facility] CV LAB;  Service: Cardiovascular;  Laterality: N/A;   LUMBAR LAMINECTOMY  1996   MITRAL VALVE REPLACEMENT N/A 01/27/2018   Procedure: MITRAL VALVE (MV) REPLACEMENT using Magna Mitral Ease Valve Size ;  Surgeon: Purcell Nails, MD;  Location: [facility] OR;  Service: Open Heart Surgery;  Laterality: N/A;   RIGHT/LEFT HEART CATH AND CORONARY ANGIOGRAPHY N/A 12/02/2017   Procedure: RIGHT/LEFT HEART CATH AND CORONARY ANGIOGRAPHY;  Surgeon: Corky Crafts, MD;  Location: [facility] INVASIVE CV LAB;  Service: Cardiovascular;  Laterality: N/A;   TEE WITHOUT CARDIOVERSION N/A 12/09/2017   Procedure: TRANSESOPHAGEAL ECHOCARDIOGRAM (TEE);  Surgeon: Jodelle Red, MD;  Location: [facility] ENDOSCOPY;  Service: Cardiovascular;  Laterality: N/A;   TEE WITHOUT CARDIOVERSION N/A 01/27/2018   Procedure: TRANSESOPHAGEAL ECHOCARDIOGRAM (TEE);  Surgeon: Purcell Nails, MD;  Location: [facility] OR;  Service: Open Heart Surgery;  Laterality: N/A;   VIDEO BRONCHOSCOPY WITH RADIAL ENDOBRONCHIAL ULTRASOUND  12/02/2022   Procedure: VIDEO BRONCHOSCOPY WITH RADIAL ENDOBRONCHIAL ULTRASOUND;  Surgeon: Leslye Peer, MD;  Location: [facility] ENDOSCOPY;  Service: Pulmonary;;    Family History  Problem Relation Age of Onset   Heart disease Mother    Heart disease Father    Prostate cancer Father 40   Bladder Cancer Father 44   Stomach cancer Paternal Aunt    Colon cancer Neg Hx    Social History:  reports that he has never smoked. He has never used smokeless tobacco. He reports current alcohol use of about 3.0 standard drinks of alcohol per week. He reports that he does not use drugs.  Allergies:  Allergies  Allergen Reactions   Other     Pt reported allergic to dust mites that causes of sneezing, runny nose    Medications Prior to Admission  Medication Sig Dispense Refill    aspirin EC 81 MG tablet Take 81 mg by mouth daily.     atorvastatin (LIPITOR) 40 MG tablet Take 1 tablet (40 mg total) by mouth daily. 90 tablet 3   carvedilol (COREG) 6.25 MG tablet Take 1 tablet (6.25 mg total) by mouth 2 (two) times daily with a meal. 180 tablet 3   Cholecalciferol 125 MCG (5000 UT) TABS Take 1 tablet by mouth daily. 30 tablet 6   levothyroxine (SYNTHROID) 75 MCG tablet Take 1 tablet (75 mcg total) by mouth daily before breakfast. 90 tablet 3   sacubitril-valsartan (ENTRESTO) 24-26 MG Take 1 tablet by mouth 2 (two) times daily. 180 tablet 3   spironolactone (ALDACTONE) 25 MG tablet Take one-half tablet (12.5 mg total) by mouth daily. 45 tablet 3   traZODone (DESYREL) 50 MG tablet Take 1 tablet (50 mg total) by mouth at bedtime as needed for sleep. 90 tablet 1   albuterol (VENTOLIN HFA) 108 (  90 Base) MCG/ACT inhaler Inhale 1-2 puffs into the lungs every 6 (six) hours as needed for wheezing or shortness of breath. (Patient not taking: Reported on 04/17/2023) 8 g 2   sildenafil (VIAGRA) 50 MG tablet Take 1 tablet (50 mg total) by mouth daily as needed for erectile dysfunction. (Patient not taking: Reported on 04/17/2023) 30 tablet 3    No results found for this or any previous visit (from the past 48 hours). No results found.  Review of Systems  HENT:  Positive for voice change.   All other systems reviewed and are negative.   Blood pressure 105/75, pulse 72, temperature 98.1 F (36.7 C), temperature source Oral, resp. rate 18, height 5\' 11"  (1.803 m), weight 81.6 kg, SpO2 97%. Physical Exam Constitutional:      Appearance: Normal appearance.  HENT:     Head: Normocephalic and atraumatic.     Right Ear: External ear normal.     Left Ear: External ear normal.     Nose: Nose normal.     Mouth/Throat:     Mouth: Mucous membranes are moist.     Pharynx: Oropharynx is clear.     Comments: Moderately breathy dysphonia. Eyes:     Extraocular Movements: Extraocular  movements intact.     Pupils: Pupils are equal, round, and reactive to light.  Cardiovascular:     Rate and Rhythm: Normal rate.  Pulmonary:     Effort: Pulmonary effort is normal.  Skin:    General: Skin is warm and dry.  Neurological:     General: No focal deficit present.     Mental Status: He is alert and oriented to person, place, and time.  Psychiatric:        Mood and Affect: Mood normal.        Behavior: Behavior normal.        Thought Content: Thought content normal.        Judgment: Judgment normal.      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  Christia Reading, MD 04/20/2023, 10:45 AM

## 2023-04-20 NOTE — Transfer of Care (Signed)
 Immediate Anesthesia Transfer of Care Note  Patient: Tyler Hendrix  Procedure(s) Performed: LARYNGOSCOPY, MICRO DIRECT WITH PROLARYN INJECTION (Bilateral)  Patient Location: PACU  Anesthesia Type:General  Level of Consciousness: awake, alert , and oriented  Airway & Oxygen Therapy: Patient Spontanous Breathing  Post-op Assessment: Report given to RN and Post -op Vital signs reviewed and stable  Post vital signs: Reviewed and stable  Last Vitals:  Vitals Value Taken Time  BP 111/60 04/20/23 1230  Temp 36.4 C 04/20/23 1230  Pulse 51 04/20/23 1236  Resp 15 04/20/23 1236  SpO2 95 % 04/20/23 1236  Vitals shown include unfiled device data.  Last Pain:  Vitals:   04/20/23 1230  TempSrc:   PainSc: 0-No pain         Complications: No notable events documented.

## 2023-04-20 NOTE — Anesthesia Postprocedure Evaluation (Signed)
 Anesthesia Post Note  Patient: Tyler Hendrix  Procedure(s) Performed: LARYNGOSCOPY, MICRO DIRECT WITH PROLARYN INJECTION (Bilateral)     Patient location during evaluation: PACU Anesthesia Type: General Level of consciousness: awake and alert Pain management: pain level controlled Vital Signs Assessment: post-procedure vital signs reviewed and stable Respiratory status: spontaneous breathing, nonlabored ventilation, respiratory function stable and patient connected to nasal cannula oxygen Cardiovascular status: blood pressure returned to baseline and stable Postop Assessment: no apparent nausea or vomiting Anesthetic complications: no   No notable events documented.  Last Vitals:  Vitals:   04/20/23 1245 04/20/23 1300  BP: 116/66 122/68  Pulse: (!) 51 (!) 51  Resp: 13 15  Temp:  36.4 C  SpO2: 95% 96%    Last Pain:  Vitals:   04/20/23 1230  TempSrc:   PainSc: 0-No pain                 Mariann Barter

## 2023-04-20 NOTE — Op Note (Signed)
 PREOPERATIVE DIAGNOSIS:  Hoarseness and left vocal cord paralysis  POSTOPERATIVE DIAGNOSIS:  Hoarseness and left vocal cord paralysis   PROCEDURE:  Suspended microdirect laryngoscopy with Prolaryn injection   SURGEON:  Christia Reading, MD   ANESTHESIA:  General with jet ventilation by anesthesia and endotracheal intubation.   COMPLICATIONS:  None.   INDICATIONS:  The patient is a 68 year old male with recently diagnosed left-sided lung cancer and also hoarseness found to be due to left vocal cord paralysis.  He presents to the operating room for surgical management.   FINDINGS:  No vocal cord lesions.  About 0.45 cc of Prolaryn was injected in the left vocal fold with majority in a posterior position.   DESCRIPTION OF PROCEDURE:  The patient was identified in the holding room, informed consent having been obtained including discussion of risks, benefits and alternatives, the patient was brought to the operative suite and put the operative table in the supine position.  Anesthesia was induced and the patient was maintained via mask ventilation.  The eyes were taped closed and bed was turned 90 degrees from anesthesia.  The patient was given intravenous steroids during the  case.  A tooth guard was placed over the upper teeth and a Stortz laryngoscope was placed into the supraglottic position and suspended to Mayo stand using the Lewy arm.  Jet ventilation was initiated.  The larynx was not well-visualized so a different shape Storz laryngoscope was exchanged.  However, again, the view was not ideal.  Thus, the patient was intubated through the laryngoscope using a 6.0 endotracheal tube.  The laryngoscope was taken out of suspension and removed from the patients' mouth.  The tube was taped and ventilation initiated via tube.  An anterior commissure laryngoscope was then used to expose the larynx with good visualization.  This was placed on suspension on the Mayo stand using a Lewy arm.  Using the zero  degree telescope for visualization, Prolaryn was injected into the left vocal fold, totaling 0.45 cc with the majority placed posteriorly but some anteriorly as well.  The larynx was then sprayed with topical lidocaine.  The laryngoscope was then taking out of suspension and removed from the patient's mouth while suctioning the airway.  The tooth guard was removed and the patient was turned back to anesthesia for wakeup, was extubated, and was taken to the recovery room in stable condition.

## 2023-04-21 ENCOUNTER — Ambulatory Visit: Payer: 59

## 2023-04-21 ENCOUNTER — Encounter (HOSPITAL_COMMUNITY): Payer: Self-pay | Admitting: Otolaryngology

## 2023-04-21 DIAGNOSIS — I442 Atrioventricular block, complete: Secondary | ICD-10-CM

## 2023-04-22 LAB — CUP PACEART REMOTE DEVICE CHECK
Battery Remaining Longevity: 113 mo
Battery Voltage: 3.04 V
Brady Statistic AP VP Percent: 94.93 %
Brady Statistic AP VS Percent: 0.03 %
Brady Statistic AS VP Percent: 4.7 %
Brady Statistic AS VS Percent: 0.33 %
Brady Statistic RA Percent Paced: 95.19 %
Brady Statistic RV Percent Paced: 99.63 %
Date Time Interrogation Session: 20250311012416
Implantable Lead Connection Status: 753985
Implantable Lead Connection Status: 753985
Implantable Lead Connection Status: 753985
Implantable Lead Implant Date: 20170922
Implantable Lead Implant Date: 20170922
Implantable Lead Implant Date: 20240909
Implantable Lead Location: 753859
Implantable Lead Location: 753860
Implantable Lead Location: 753860
Implantable Lead Model: 3830
Implantable Lead Model: 5076
Implantable Lead Model: 5076
Implantable Pulse Generator Implant Date: 20240909
Lead Channel Impedance Value: 2717 Ohm
Lead Channel Impedance Value: 304 Ohm
Lead Channel Impedance Value: 304 Ohm
Lead Channel Impedance Value: 361 Ohm
Lead Channel Impedance Value: 418 Ohm
Lead Channel Impedance Value: 437 Ohm
Lead Channel Impedance Value: 456 Ohm
Lead Channel Impedance Value: 551 Ohm
Lead Channel Impedance Value: 646 Ohm
Lead Channel Pacing Threshold Amplitude: 0.375 V
Lead Channel Pacing Threshold Amplitude: 0.625 V
Lead Channel Pacing Threshold Amplitude: 0.875 V
Lead Channel Pacing Threshold Pulse Width: 0.4 ms
Lead Channel Pacing Threshold Pulse Width: 0.4 ms
Lead Channel Pacing Threshold Pulse Width: 0.4 ms
Lead Channel Sensing Intrinsic Amplitude: 3.125 mV
Lead Channel Sensing Intrinsic Amplitude: 3.125 mV
Lead Channel Sensing Intrinsic Amplitude: 6.625 mV
Lead Channel Sensing Intrinsic Amplitude: 6.625 mV
Lead Channel Setting Pacing Amplitude: 1.25 V
Lead Channel Setting Pacing Amplitude: 1.5 V
Lead Channel Setting Pacing Amplitude: 2.5 V
Lead Channel Setting Pacing Pulse Width: 0.4 ms
Lead Channel Setting Pacing Pulse Width: 0.4 ms
Lead Channel Setting Sensing Sensitivity: 2.8 mV
Zone Setting Status: 755011
Zone Setting Status: 755011

## 2023-04-23 ENCOUNTER — Encounter: Payer: Self-pay | Admitting: Internal Medicine

## 2023-04-27 ENCOUNTER — Encounter: Payer: Self-pay | Admitting: Genetic Counselor

## 2023-04-27 ENCOUNTER — Ambulatory Visit (HOSPITAL_COMMUNITY)

## 2023-04-27 ENCOUNTER — Ambulatory Visit (HOSPITAL_COMMUNITY)
Admission: RE | Admit: 2023-04-27 | Discharge: 2023-04-27 | Disposition: A | Discharge status: Home / Self Care | Source: Ambulatory Visit | Attending: Physician Assistant | Admitting: Physician Assistant

## 2023-04-27 DIAGNOSIS — Z1379 Encounter for other screening for genetic and chromosomal anomalies: Secondary | ICD-10-CM | POA: Insufficient documentation

## 2023-04-27 DIAGNOSIS — C3412 Malignant neoplasm of upper lobe, left bronchus or lung: Secondary | ICD-10-CM | POA: Diagnosis not present

## 2023-04-27 DIAGNOSIS — C799 Secondary malignant neoplasm of unspecified site: Secondary | ICD-10-CM | POA: Insufficient documentation

## 2023-04-27 DIAGNOSIS — I700 Atherosclerosis of aorta: Secondary | ICD-10-CM | POA: Diagnosis not present

## 2023-04-27 MED ORDER — IOHEXOL 300 MG/ML  SOLN
100.0000 mL | Freq: Once | INTRAMUSCULAR | Status: AC | PRN
  Administered 2023-04-27: 100 mL via INTRAVENOUS

## 2023-04-27 MED ORDER — IOHEXOL 9 MG/ML PO SOLN
500.0000 mL | ORAL | Status: AC
  Administered 2023-04-27: 1000 mL via ORAL

## 2023-04-27 MED ORDER — SODIUM CHLORIDE (PF) 0.9 % IJ SOLN
INTRAMUSCULAR | Status: AC
  Filled 2023-04-27: qty 50

## 2023-04-30 ENCOUNTER — Ambulatory Visit: Payer: Self-pay | Admitting: Genetic Counselor

## 2023-04-30 ENCOUNTER — Telehealth: Payer: Self-pay | Admitting: Genetic Counselor

## 2023-04-30 DIAGNOSIS — Z1379 Encounter for other screening for genetic and chromosomal anomalies: Secondary | ICD-10-CM

## 2023-04-30 NOTE — Progress Notes (Signed)
 HPI:  Tyler Hendrix was previously seen in the [facility] due to a personal and family history of cancer and concerns regarding a hereditary predisposition to cancer. Please refer to our prior cancer genetics clinic note for more information regarding our discussion, assessment and recommendations, at the time. Tyler Hendrix recent genetic test results were disclosed to him, as were recommendations warranted by these results. These results and recommendations are discussed in more detail below.  CANCER HISTORY:  Oncology History  Non-small cell carcinoma of left lung, stage 4 (HCC)  12/23/2022 Initial Diagnosis   Non-small cell carcinoma of left lung, stage 4 (HCC)   12/23/2022 Cancer Staging   Staging form: Lung, AJCC 8th Edition - Clinical: Stage IVB (cT2b, cN3, cM1c) - Signed by Si Gaul, MD on 12/23/2022   12/29/2022 -  Chemotherapy   Patient is on Treatment Plan : LUNG NSCLC Pembrolizumab (200) q21d     04/26/2023 Genetic Testing   Negative genetic testing on the CustomNext-Cancer+RNAinsight panel.  The report date is April 26, 2023.  The [facility] CustomNext-Cancer+RNAinsight Panel includes sequencing, rearrangement analysis, and RNA analysis for the following 39 genes: APC, ATM, BAP1, BARD1, BMPR1A, BRCA1, BRCA2, BRIP1, CDH1, CDKN2A, CEBPA, CHEK2, ETV6, FH, FLCN, GATA2, MET, MLH1, MSH2, MSH6, MUTYH, NF1, NTHL1, PALB2, PMS2, POT1, PTEN, RAD51C, RAD51D, RUNX1, SMAD4, STK11, TP53, TSC1, TSC2 and VHL (sequencing and deletion/duplication); AXIN2, DDX41, HOXB13, MBD4, MSH3, POLD1 and POLE (sequencing only); EPCAM and GREM1 (deletion/duplication only). RNA data is routinely analyzed for use in variant interpretation for all genes.     FAMILY HISTORY:  We obtained a detailed, 4-generation family history.  Significant diagnoses are listed below: Family History  Problem Relation Age of Onset   Heart disease Mother    Heart disease Father    Prostate cancer Father 11    Bladder Cancer Father 38   Stomach cancer Paternal Aunt    Colon cancer Neg Hx        The patient has a son and two daughters who are cancer free.  He has a brother and sister who are cancer free.  His parents are deceased.   The patients father had prostate cancer at 79 and possible bladder cancer at 63.  He had three sisters, one had stomach cancer.  The paternal grandparents did not have cancer.   The patient's mother died of heart disease.  She had two brothers and a paternal half sister who are cancer free.    Tyler Hendrix is unaware of previous family history of genetic testing for hereditary cancer risks. Patient's maternal ancestors are of [place] descent, and paternal ancestors are of [place] descent. There is no reported Ashkenazi Jewish ancestry. There is no known consanguinity  GENETIC TEST RESULTS: Genetic testing reported out on April 26, 2023 through the CustomNext-Cancer+RNA cancer panel found no pathogenic mutations. The [facility] CustomNext-Cancer+RNAinsight Panel includes sequencing, rearrangement analysis, and RNA analysis for the following 45 genes: APC, ATM, BAP1, BARD1, BMPR1A, BRCA1, BRCA2, BRIP1, CDH1, CDKN2A, CEBPA, CHEK2, ETV6, FH, FLCN, GATA2, MET, MLH1, MSH2, MSH6, MUTYH, NF1, NTHL1, PALB2, PMS2, POT1, PTEN, RAD51C, RAD51D, RUNX1, SMAD4, STK11, TP53, TSC1, TSC2 and VHL (sequencing and deletion/duplication); AXIN2, DDX41, HOXB13, MBD4, MSH3, POLD1 and POLE (sequencing only); EPCAM and GREM1 (deletion/duplication only). RNA data is routinely analyzed for use in variant interpretation for all genes. The test report has been scanned into EPIC and is located under the Molecular Pathology section of the Results Review tab.  A portion of the result report  is included below for reference.     We discussed with Tyler Hendrix that because current genetic testing is not perfect, it is possible there may be a gene mutation in one of these genes that current testing cannot detect, but that  chance is small.  We also discussed, that there could be another gene that has not yet been discovered, or that we have not yet tested, that is responsible for the cancer diagnoses in the family. It is also possible there is a hereditary cause for the cancer in the family that Tyler Hendrix did not inherit and therefore was not identified in his testing.  Therefore, it is important to remain in touch with cancer genetics in the future so that we can continue to offer Tyler Hendrix the most up to date genetic testing.   ADDITIONAL GENETIC TESTING: We discussed with Tyler Hendrix that his genetic testing was fairly extensive.  If there are genes identified to increase cancer risk that can be analyzed in the future, we would be happy to discuss and coordinate this testing at that time.    CANCER SCREENING RECOMMENDATIONS: Tyler Hendrix test result is considered negative (normal).  This means that we have not identified a hereditary cause for his personal and family history of cancer at this time. Most cancers happen by chance and this negative test suggests that his personal and family history of cancer may fall into this category.    Possible reasons for Tyler Hendrix negative genetic test include:  1. There may be a gene mutation in one of these genes that current testing methods cannot detect but that chance is small.  2. There could be another gene that has not yet been discovered, or that we have not yet tested, that is responsible for the cancer diagnoses in the family.  3.  There may be no hereditary risk for cancer in the family. The cancers in Tyler Hendrix and/or his family may be sporadic/familial or due to other genetic and environmental factors. 4. It is also possible there is a hereditary cause for the cancer in the family that Tyler Hendrix did not inherit.  Therefore, it is recommended he continue to follow the cancer management and screening guidelines provided by his oncology and primary healthcare  provider. An individual's cancer risk and medical management are not determined by genetic test results alone. Overall cancer risk assessment incorporates additional factors, including personal medical history, family history, and any available genetic information that may result in a personalized plan for cancer prevention and surveillance  RECOMMENDATIONS FOR FAMILY MEMBERS:   Since he did not inherit a identifiable mutation in a cancer predisposition gene included on this panel, his children could not have inherited a known mutation from his in one of these genes. Individuals in this family might be at some increased risk of developing cancer, over the general population risk, simply due to the family history of cancer.  We recommended women in this family have a yearly mammogram beginning at age 51, or 6 years younger than the earliest onset of cancer, an annual clinical breast exam, and perform monthly breast self-exams. Women in this family should also have a gynecological exam as recommended by their primary provider. All family members should be referred for colonoscopy starting at age 23, or 46 years younger than the earliest onset of cancer.  FOLLOW-UP: Lastly, we discussed with Tyler Hendrix that cancer genetics is a rapidly advancing field and it is possible that new genetic tests will  be appropriate for him and/or his family members in the future. We encouraged him to remain in contact with cancer genetics on an annual basis so we can update his personal and family histories and let him know of advances in cancer genetics that may benefit this family.   Our contact number was provided. Tyler Hendrix questions were answered to his satisfaction, and he knows he is welcome to call us at anytime with additional questions or concerns.   Maylon Cos, MS, [facility] Licensed, Certified Genetic Counselor [email or web address]

## 2023-04-30 NOTE — Telephone Encounter (Signed)
 Revealed negative genetic testing.  Discussed that we do not know why he has lung cancer and a previous diagnosis of lymphoma or why there is cancer in the family. It could be due to a different gene that we are not testing, or maybe our current technology may not be able to pick something up.  It will be important for him to keep in contact with genetics to keep up with whether additional testing may be needed.

## 2023-05-04 ENCOUNTER — Inpatient Hospital Stay: Payer: 59

## 2023-05-04 ENCOUNTER — Inpatient Hospital Stay (HOSPITAL_BASED_OUTPATIENT_CLINIC_OR_DEPARTMENT_OTHER): Payer: 59 | Admitting: Internal Medicine

## 2023-05-04 VITALS — BP 132/78 | HR 89 | Temp 98.2°F | Resp 17 | Wt 182.7 lb

## 2023-05-04 VITALS — BP 125/77 | HR 56 | Resp 17

## 2023-05-04 DIAGNOSIS — Z9221 Personal history of antineoplastic chemotherapy: Secondary | ICD-10-CM | POA: Diagnosis not present

## 2023-05-04 DIAGNOSIS — C3492 Malignant neoplasm of unspecified part of left bronchus or lung: Secondary | ICD-10-CM

## 2023-05-04 DIAGNOSIS — J3801 Paralysis of vocal cords and larynx, unilateral: Secondary | ICD-10-CM | POA: Diagnosis not present

## 2023-05-04 DIAGNOSIS — Z95828 Presence of other vascular implants and grafts: Secondary | ICD-10-CM

## 2023-05-04 DIAGNOSIS — Z5112 Encounter for antineoplastic immunotherapy: Secondary | ICD-10-CM | POA: Diagnosis not present

## 2023-05-04 DIAGNOSIS — C3412 Malignant neoplasm of upper lobe, left bronchus or lung: Secondary | ICD-10-CM | POA: Diagnosis not present

## 2023-05-04 DIAGNOSIS — C7972 Secondary malignant neoplasm of left adrenal gland: Secondary | ICD-10-CM | POA: Diagnosis not present

## 2023-05-04 DIAGNOSIS — Z8571 Personal history of Hodgkin lymphoma: Secondary | ICD-10-CM | POA: Diagnosis not present

## 2023-05-04 DIAGNOSIS — C7971 Secondary malignant neoplasm of right adrenal gland: Secondary | ICD-10-CM | POA: Diagnosis not present

## 2023-05-04 LAB — CBC WITH DIFFERENTIAL (CANCER CENTER ONLY)
Abs Immature Granulocytes: 0.03 10*3/uL (ref 0.00–0.07)
Basophils Absolute: 0 10*3/uL (ref 0.0–0.1)
Basophils Relative: 0 %
Eosinophils Absolute: 0.2 10*3/uL (ref 0.0–0.5)
Eosinophils Relative: 2 %
HCT: 37.4 % — ABNORMAL LOW (ref 39.0–52.0)
Hemoglobin: 12.7 g/dL — ABNORMAL LOW (ref 13.0–17.0)
Immature Granulocytes: 0 %
Lymphocytes Relative: 11 %
Lymphs Abs: 0.8 10*3/uL (ref 0.7–4.0)
MCH: 33.2 pg (ref 26.0–34.0)
MCHC: 34 g/dL (ref 30.0–36.0)
MCV: 97.7 fL (ref 80.0–100.0)
Monocytes Absolute: 0.4 10*3/uL (ref 0.1–1.0)
Monocytes Relative: 5 %
Neutro Abs: 6.3 10*3/uL (ref 1.7–7.7)
Neutrophils Relative %: 82 %
Platelet Count: 223 10*3/uL (ref 150–400)
RBC: 3.83 MIL/uL — ABNORMAL LOW (ref 4.22–5.81)
RDW: 12.9 % (ref 11.5–15.5)
WBC Count: 7.7 10*3/uL (ref 4.0–10.5)
nRBC: 0 % (ref 0.0–0.2)

## 2023-05-04 LAB — CMP (CANCER CENTER ONLY)
ALT: 16 U/L (ref 0–44)
AST: 17 U/L (ref 15–41)
Albumin: 4.3 g/dL (ref 3.5–5.0)
Alkaline Phosphatase: 59 U/L (ref 38–126)
Anion gap: 5 (ref 5–15)
BUN: 18 mg/dL (ref 8–23)
CO2: 26 mmol/L (ref 22–32)
Calcium: 9 mg/dL (ref 8.9–10.3)
Chloride: 105 mmol/L (ref 98–111)
Creatinine: 1.13 mg/dL (ref 0.61–1.24)
GFR, Estimated: >60 mL/min (ref >60)
Glucose, Bld: 109 mg/dL — ABNORMAL HIGH (ref 70–99)
Potassium: 4.7 mmol/L (ref 3.5–5.1)
Sodium: 136 mmol/L (ref 135–145)
Total Bilirubin: 1 mg/dL (ref 0.0–1.2)
Total Protein: 6.7 g/dL (ref 6.5–8.1)

## 2023-05-04 MED ORDER — SODIUM CHLORIDE 0.9 % IV SOLN: INTRAVENOUS | Status: DC

## 2023-05-04 MED ORDER — SODIUM CHLORIDE 0.9% FLUSH
10.0000 mL | Freq: Once | INTRAVENOUS | Status: AC
  Administered 2023-05-04: 10 mL

## 2023-05-04 MED ORDER — HEPARIN SOD (PORK) LOCK FLUSH 100 UNIT/ML IV SOLN
500.0000 [IU] | Freq: Once | INTRAVENOUS | Status: AC | PRN
Start: 2023-05-04 — End: 2023-05-04
  Administered 2023-05-04: 500 [IU]

## 2023-05-04 MED ORDER — SODIUM CHLORIDE 0.9 % IV SOLN
200.0000 mg | Freq: Once | INTRAVENOUS | Status: AC
  Administered 2023-05-04: 200 mg via INTRAVENOUS
  Filled 2023-05-04: qty 200

## 2023-05-04 MED ORDER — SODIUM CHLORIDE 0.9% FLUSH
10.0000 mL | INTRAVENOUS | Status: DC | PRN
  Administered 2023-05-04: 10 mL

## 2023-05-04 NOTE — Progress Notes (Signed)
 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 He was diagnosed in October 2024.  He presented with metabolic central left upper lobe lung mass invading the mediastinum, small hypermetabolic right hilar lymph nodes, bilateral adrenal gland masses and splenic lesion consistent metastatic disease, hypermetabolic focus in the right gluteus maximus muscle.    Molecular studies by [employer] show MSI high.  Biomarker Findings Microsatellite status - MSI-High Tumor Mutational Burden - 38 Muts/Mb HRD signature - HRDsig Negative Genomic Findings For a complete list of the genes assayed, please refer to the Appendix. PALB2 F49fs*12 KRAS G12A NF1 I693fs*21 PIK3CA P17S - subclonal, H1047R? ASXL1 G635fs*58, K665fs*1 - subclonal? MSH2 loss exons 9-16 RAC1 P29H TP53 M237I 7 Disease relevant genes with no reportable alterations: ALK, BRAF, EGFR, ERBB2, MET, RET, ROS1   PDL1 98%.   PRIOR THERAPY: None   CURRENT THERAPY: Immunotherapy with Keytruda 200 mg IV every 3 weeks.  First dose on 12/29/22.  Status post 6 cycles.  INTERVAL HISTORY: Tyler Hendrix 68 y.o. male returns to the clinic today for follow-up visit accompanied by his wife.Discussed the use of AI scribe software for clinical note transcription with the patient, who gave verbal consent to proceed.  History of Present Illness   Tyler Hendrix is a 68 year old male with stage four non-small cell lung cancer who presents for evaluation before starting cycle number seven of Keytruda. He is accompanied by his wife.  He has stage four poorly differentiated non-small cell lung cancer with no actionable mutations and a PD-L1 expression of 98%. He is undergoing treatment with Keytruda, 200 mg IV every three weeks, and has completed six  cycles.  He is upset about the tumor shrinkage not being as significant as he had hoped, although the tumors have not grown. He notes better shrinkage in January but understands that the cancer has stabilized.  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 The surgery was performed by Christia Reading, and he is pleased with the outcome as his voice is now understandable. He had hoped that tumor shrinkage would restore his voice but understands the nerve damage is permanent.       MEDICAL HISTORY: Past Medical History:  Diagnosis Date   Abnormal TSH    Arthritis    ddd   Atrial fibrillation and flutter (HCC)    s/p DCCV 03/26/2018   CAD in native artery    a. 11/2015: s/p 2 vessel PCI of the first diagonal branch of the LAD in the mid left circumflex with DES. // s/p CABG 01/2018   Chronic combined systolic and diastolic CHF (congestive heart failure) (HCC)    sees Dr. Johney Frame    Complete heart block (HCC)    a. s/p Medtronic ppm 10/2015.   Dyspnea    with exertion   Family history of bladder cancer    Family history of prostate cancer    Family history of stomach cancer    History of radiation therapy 2002   Hodgkin disease (HCC) 2002   a. s/p chest radiation and chemotherapy   Hypertension    Hypothyroidism    sees Dr. 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  MEDICATIONS:  Current Outpatient Medications  Medication Sig Dispense Refill   albuterol (VENTOLIN HFA) 108 (90 Base) MCG/ACT inhaler Inhale 1-2 puffs into the lungs every 6 (six) hours as needed for wheezing or shortness of breath. (Patient not taking: Reported on 04/17/2023) 8 g 2   aspirin EC 81 MG tablet Take 81 mg by mouth daily.     atorvastatin (LIPITOR) 40 MG tablet Take 1 tablet (40 mg total) by mouth daily. 90 tablet 3   carvedilol (COREG) 6.25 MG tablet Take 1 tablet (6.25 mg total) by mouth 2 (two) times daily with a meal. 180 tablet 3   Cholecalciferol 125 MCG (5000 UT) TABS Take 1 tablet by mouth daily. 30 tablet 6   levothyroxine (SYNTHROID) 75 MCG tablet Take 1 tablet (75 mcg total) by mouth daily before breakfast. 90 tablet 3   sacubitril-valsartan (ENTRESTO) 24-26 MG Take 1 tablet by mouth 2 (two) times daily. 180 tablet 3   sildenafil (VIAGRA) 50 MG tablet Take 1 tablet (50 mg total) by mouth daily as needed for erectile dysfunction. (Patient not taking: Reported on 04/17/2023) 30 tablet 3   spironolactone (ALDACTONE) 25 MG tablet Take one-half tablet (12.5 mg total) by mouth daily. 45 tablet 3   traZODone (DESYREL) 50 MG tablet Take 1 tablet (50 mg total) by mouth at bedtime as needed for sleep. 90 tablet 1   No current facility-administered medications for this visit.    SURGICAL HISTORY:  Past Surgical History:  Procedure Laterality Date   A-FLUTTER ABLATION N/A 06/20/2021   Procedure: A-FLUTTER ABLATION;  Surgeon: Marinus Maw, MD;  Location[facility] INVASIVE CV LAB;  Service: Cardiovascular;  Laterality: N/A;   BIV UPGRADE N/A 02/08/2019   Procedure: UPGRADE TO BIV PPM;  Surgeon: Hillis Range, MD;  Location: [facility] INVASIVE CV LAB;  Service: Cardiovascular;  Laterality: N/A;   BRONCHIAL BIOPSY  12/02/2022   Procedure: BRONCHIAL BIOPSIES;  Surgeon: Leslye Peer, MD;  Location: [facility] ENDOSCOPY;  Service: Pulmonary;;   BRONCHIAL  BRUSHINGS  12/02/2022   Procedure: BRONCHIAL BRUSHINGS;  Surgeon: Leslye Peer, MD;  Location: [facility] ENDOSCOPY;  Service: Pulmonary;;   BRONCHIAL NEEDLE ASPIRATION BIOPSY  12/02/2022   Procedure: BRONCHIAL NEEDLE ASPIRATION BIOPSIES;  Surgeon: Leslye Peer, MD;  Location: [facility] ENDOSCOPY;  Service: Pulmonary;;   BRONCHIAL WASHINGS  12/02/2022   Procedure: BRONCHIAL WASHINGS;  Surgeon: Leslye Peer, MD;  Location: [facility] ENDOSCOPY;  Service: Pulmonary;;   CARDIAC CATHETERIZATION N/A 11/02/2015   Procedure: Left Heart Cath and Coronary Angiography;  Surgeon: Tonny Bollman, MD;  Location: [facility] INVASIVE CV LAB;  Service: Cardiovascular;  Laterality: N/A;   CARDIAC CATHETERIZATION N/A 11/21/2015   Procedure: Coronary Stent Intervention;  Surgeon: Tonny Bollman, MD;  Location: [facility] INVASIVE CV LAB;  Service: Cardiovascular;  Laterality: N/A;   CARDIAC CATHETERIZATION  11/2017   CARDIOVERSION N/A 03/26/2018   Procedure:  CARDIOVERSION;  Surgeon: Parke Poisson, MD;  Location: [facility] ENDOSCOPY;  Service: Cardiovascular;  Laterality: N/A;   COLONOSCOPY  01/16/2015   per Dr. Christella Hartigan, benign polyps, repeat in 10 yrs    CORONARY ARTERY BYPASS GRAFT N/A 01/27/2018   Procedure: CORONARY ARTERY BYPASS GRAFTING (CABG) x two, using left internal mammary artery and right leg greater saphenous vein harvested endoscopically;  Surgeon: Purcell Nails, MD;  Location: [facility] OR;  Service: Open Heart Surgery;  Laterality: N/A;   DIRECT LARYNGOSCOPY Bilateral 04/20/2023   Procedure: LARYNGOSCOPY, MICRO DIRECT WITH PROLARYN INJECTION;  Surgeon: Christia Reading, MD;  Location: [facility] OR;  Service: ENT;  Laterality: Bilateral;   EP IMPLANTABLE DEVICE N/A 11/02/2015   MDT Adivisa MRI conditional dual chamber pacemaker implanted by Dr Johney Frame for complete heart block   FEMUR IM NAIL Left 07/01/2022   Procedure: INTRAMEDULLARY (IM) NAIL FEMORAL LEFT ANTEGRADE;  Surgeon: Myrene Galas, MD;  Location: [facility] OR;  Service: Orthopedics;  Laterality:  Left;   IR IMAGING GUIDED PORT INSERTION  01/02/2023   KNEE SURGERY Right 1972   LEAD EXTRACTION N/A 10/20/2022   Procedure: LEAD EXTRACTION;  Surgeon: Marinus Maw, MD;  Location: [facility] INVASIVE CV LAB;  Service: Cardiovascular;  Laterality: N/A;   LUMBAR LAMINECTOMY  1996   MITRAL VALVE REPLACEMENT N/A 01/27/2018   Procedure: MITRAL VALVE (MV) REPLACEMENT using Magna Mitral Ease Valve Size ;  Surgeon: Purcell Nails, MD;  Location: [facility] OR;  Service: Open Heart Surgery;  Laterality: N/A;   RIGHT/LEFT HEART CATH AND CORONARY ANGIOGRAPHY N/A 12/02/2017   Procedure: RIGHT/LEFT HEART CATH AND CORONARY ANGIOGRAPHY;  Surgeon: Corky Crafts, MD;  Location: [facility] INVASIVE CV LAB;  Service: Cardiovascular;  Laterality: N/A;   TEE WITHOUT CARDIOVERSION N/A 12/09/2017   Procedure: TRANSESOPHAGEAL ECHOCARDIOGRAM (TEE);  Surgeon: Jodelle Red, MD;  Location: [facility] ENDOSCOPY;  Service: Cardiovascular;  Laterality: N/A;   TEE WITHOUT CARDIOVERSION N/A 01/27/2018   Procedure: TRANSESOPHAGEAL ECHOCARDIOGRAM (TEE);  Surgeon: Purcell Nails, MD;  Location: [facility] OR;  Service: Open Heart Surgery;  Laterality: N/A;   VIDEO BRONCHOSCOPY WITH RADIAL ENDOBRONCHIAL ULTRASOUND  12/02/2022   Procedure: VIDEO BRONCHOSCOPY WITH RADIAL ENDOBRONCHIAL ULTRASOUND;  Surgeon: Leslye Peer, MD;  Location: [facility] ENDOSCOPY;  Service: Pulmonary;;    REVIEW OF SYSTEMS:  Constitutional: negative Eyes: negative Ears, nose, mouth, throat, and face: positive for hoarseness Respiratory: negative Cardiovascular: negative Gastrointestinal: negative Genitourinary:negative Integument/breast: negative Hematologic/lymphatic: negative Musculoskeletal:negative Neurological: negative Behavioral/Psych: negative Endocrine: negative Allergic/Immunologic: negative   PHYSICAL EXAMINATION: General appearance: alert, cooperative, fatigued, and no distress Head: Normocephalic, without obvious abnormality, atraumatic Neck: no  adenopathy, no JVD, supple, symmetrical, trachea midline, and thyroid not enlarged, symmetric, no tenderness/mass/nodules Lymph nodes: Cervical, supraclavicular, and axillary nodes normal. Resp: clear to auscultation bilaterally Back: symmetric, no curvature. ROM normal. No CVA tenderness. Cardio: regular rate and rhythm, S1, S2 normal, no murmur, click, rub or gallop GI: soft, non-tender; bowel sounds normal; no masses,  no organomegaly Extremities: extremities normal, atraumatic, no cyanosis or edema Neurologic: Alert and oriented X 3, normal strength and tone. Normal symmetric reflexes. Normal coordination and gait  ECOG PERFORMANCE STATUS: 1 - Symptomatic but completely ambulatory  Blood pressure 132/78, pulse 89, temperature 98.2 F (36.8 C), temperature source Temporal, resp. rate 17, weight 182 lb 11.2 oz (82.9 kg), SpO2 100%.  LABORATORY DATA: Lab Results  Component Value Date   WBC 7.7 05/04/2023   HGB 12.7 (L) 05/04/2023   HCT 37.4 (L) 05/04/2023   MCV 97.7 05/04/2023  PLT 223 05/04/2023      Chemistry      Component Value Date/Time   NA 136 04/13/2023 1019   NA 137 06/06/2021 0919   K 4.7 04/13/2023 1019   CL 104 04/13/2023 1019   CO2 28 04/13/2023 1019   BUN 20 04/13/2023 1019   BUN 19 06/06/2021 0919   CREATININE 1.08 04/13/2023 1019   CREATININE 1.04 01/01/2016 0801      Component Value Date/Time   CALCIUM 9.3 04/13/2023 1019   ALKPHOS 63 04/13/2023 1019   AST 19 04/13/2023 1019   ALT 15 04/13/2023 1019   BILITOT 1.0 04/13/2023 1019       RADIOGRAPHIC STUDIES: CT CHEST ABDOMEN PELVIS W CONTRAST Result Date: 05/03/2023 CLINICAL DATA:  Metastatic non-small cell lung cancer restaging, additional history of Hodgkin's lymphoma * Tracking Code: BO * EXAM: CT CHEST, ABDOMEN, AND PELVIS WITH CONTRAST TECHNIQUE: Multidetector CT imaging of the chest, abdomen and pelvis was performed following the standard protocol during bolus administration of intravenous  contrast. RADIATION DOSE REDUCTION: This exam was performed according to the departmental dose-optimization program which includes automated exposure control, adjustment of the mA and/or kV according to patient size and/or use of iterative reconstruction technique. CONTRAST:  OMNIPAQUE IOHEXOL 300 MG/ML SOLN additional oral enteric contrast COMPARISON:  02/23/2023 FINDINGS: CT CHEST FINDINGS Cardiovascular: Right chest port catheter. Aortic atherosclerosis. Left chest multi lead pacer. Cardiomegaly. Mitral valve prosthesis. Three-vessel coronary artery calcifications and stents status post median sternotomy and CABG. No pericardial effusion. Mediastinum/Nodes: Small benign calcified subcarinal lymph nodes (series 2, image 35). No enlarged mediastinal, hilar, or axillary lymph nodes. Thyroid gland, trachea, and esophagus demonstrate no significant findings. Lungs/Pleura: Continued slight decrease in size of a spiculated mass of the paramedian suprahilar left upper lobe, with soft tissue invasion of the adjacent prevascular space measuring 2.3 x 1.5 cm, previously 2.6 x 2.2 cm (series 2, image 22). Mild diffuse bilateral bronchial wall thickening. Background of fine centrilobular pulmonary nodules, most concentrated in the lung apices. Diminished tiny nodule of the dependent right lower lobe, measuring 0.2 cm (series 5, image 119). Unchanged tiny nodule of the dependent left lower lobe measuring 0.3 cm, possibly faintly calcified (series 5, image 129). Additional tiny, benign calcified nodules in the left lung base. No pleural effusion or pneumothorax. Musculoskeletal: No chest wall abnormality. No acute osseous findings. CT ABDOMEN PELVIS FINDINGS Hepatobiliary: No solid liver abnormality is seen. Hepatic steatosis. Tiny gallstone (series 6, image 51). Gallbladder wall thickening, or biliary dilatation. Pancreas: Unremarkable. No pancreatic ductal dilatation or surrounding inflammatory changes. Spleen: Normal  in size without significant abnormality. Adrenals/Urinary Tract: Continued decrease in size of a left adrenal metastasis measuring 1.4 x 0.9 cm, previously 1.9 x 1.2 cm (series 2, image 66). Mild persistent thickening of the right adrenal gland without discretely measurable residual metastasis (series 2, image 64). Kidneys are normal, without renal calculi, solid lesion, or hydronephrosis. Bladder is unremarkable. Stomach/Bowel: Stomach is within normal limits. Appendix not clearly visualized. No evidence of bowel wall thickening, distention, or inflammatory changes. Vascular/Lymphatic: Aortic atherosclerosis. No enlarged abdominal or pelvic lymph nodes. Reproductive: Prostatomegaly. Other: Small bilateral inguinal hernias containing fat and partial nonobstructed loops of sigmoid colon on the left and distal small bowel in the right (series 2, image 124). No ascites. Musculoskeletal: No acute osseous findings. IMPRESSION: 1. Continued slight decrease in size of a spiculated left upper lobe mass. 2. 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 Mild persistent thickening of the right  adrenal gland without discretely measurable residual metastasis. 3. Diminished tiny pulmonary nodule of the dependent right lower lobe, likely infectious or inflammatory. Other small nodules unchanged, particularly in the left lung base. 4. Cardiomegaly and coronary artery disease. 5. Hepatic steatosis. 6. Cholelithiasis. Aortic Atherosclerosis (ICD10-I70.0). Electronically Signed   By: Jearld Lesch M.D.   On: 05/03/2023 21:52   CUP PACEART REMOTE DEVICE CHECK Result Date: 04/22/2023 Scheduled remote reviewed. Normal device function.  HF diagnostics have been normal this monitoring period.  1 VHR detection, consistent with NSVT 12 beats.  Several v. sensing episodes noted, additional EGMs reviewed on website consistent with AV dissociation/slow VT 120-140 bpm, longest 12 beats. Next remote 91 days. 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 He was diagnosed in October 2024.  He presented with metabolic central left upper lobe lung mass invading the mediastinum, small hypermetabolic right hilar lymph nodes, bilateral adrenal gland masses and splenic lesion consistent metastatic disease, hypermetabolic focus in the right gluteus maximus muscle.  His molecular studies by [employer] showed MSI high and PD-L1 expression of 98%.  The patient is currently on treatment with immunotherapy with single agent Keytruda 200 Mg IV every 3 weeks status post 6 cycles. He has been tolerating this treatment fairly well. He had repeat CT scan of the chest, abdomen and pelvis performed recently.  His scan showed continued slight decrease in the size of the spiculated left upper lobe mass as well as decrease in the left adrenal metastasis.    Stage IV poorly differentiated non-small cell lung cancer He has stage IV poorly differentiated non-small cell lung cancer with no actionable mutations and a PD-L1 expression of 98%. He is undergoing treatment with pembrolizumab 200 mg IV every three weeks and has completed six cycles. He expressed disappointment with the tumor shrinkage, which was less than expected. However, stabilization of the disease is a positive outcome, as significant shrinkage is typically observed at the beginning of treatment, followed by a plateau. The goal is to maintain this plateau to prevent disease progression. While the cancer is not curable, maintaining stability is considered a success. He was informed that if the disease progresses in the future, alternative treatment options will be discussed. - Continue pembrolizumab 200 mg IV every three weeks - Monitor for disease progression and side effects  Vocal cord paralysis He underwent a laryngeal surgery performed by Dr. Christia Reading due to the  cancer compressing and invading the nerve, leading to vocal cord paralysis. He is pleased with the outcome, as his voice is now understandable. Nerve damage, similar to brain damage, does not regenerate quickly, and the improvement in voice is a positive outcome. - Monitor voice function and recovery   The patient was advised to call immediately if he has any concerning symptoms in the interval. The patient voices understanding of current disease status and treatment options and is in agreement with the current care plan.  All questions were answered. The patient knows to call the clinic with any problems, questions or concerns. We can certainly see the patient much sooner if necessary.  The total time spent in the appointment was 30 minutes.  Disclaimer: This note was dictated with voice recognition software. Similar sounding words can inadvertently be transcribed and may not be corrected upon review.

## 2023-05-04 NOTE — Patient Instructions (Signed)

## 2023-05-07 ENCOUNTER — Ambulatory Visit (HOSPITAL_COMMUNITY): Attending: Cardiology

## 2023-05-07 DIAGNOSIS — I371 Nonrheumatic pulmonary valve insufficiency: Secondary | ICD-10-CM

## 2023-05-07 DIAGNOSIS — I5022 Chronic systolic (congestive) heart failure: Secondary | ICD-10-CM | POA: Diagnosis not present

## 2023-05-07 DIAGNOSIS — I442 Atrioventricular block, complete: Secondary | ICD-10-CM | POA: Diagnosis not present

## 2023-05-07 LAB — ECHOCARDIOGRAM COMPLETE
Area-P 1/2: 1.86 cm2
Est EF: 25
MV VTI: 0.94 cm2
S' Lateral: 5.1 cm

## 2023-05-08 ENCOUNTER — Encounter: Payer: Self-pay | Admitting: Internal Medicine

## 2023-05-11 ENCOUNTER — Other Ambulatory Visit (HOSPITAL_COMMUNITY): Payer: Self-pay | Admitting: *Deleted

## 2023-05-11 ENCOUNTER — Other Ambulatory Visit (HOSPITAL_COMMUNITY): Payer: Self-pay

## 2023-05-11 DIAGNOSIS — I5022 Chronic systolic (congestive) heart failure: Secondary | ICD-10-CM

## 2023-05-11 DIAGNOSIS — I442 Atrioventricular block, complete: Secondary | ICD-10-CM

## 2023-05-11 DIAGNOSIS — Z951 Presence of aortocoronary bypass graft: Secondary | ICD-10-CM

## 2023-05-12 ENCOUNTER — Other Ambulatory Visit (HOSPITAL_COMMUNITY): Payer: Self-pay

## 2023-05-12 ENCOUNTER — Encounter: Payer: Self-pay | Admitting: Internal Medicine

## 2023-05-20 ENCOUNTER — Encounter (HOSPITAL_COMMUNITY): Payer: Self-pay | Admitting: Cardiology

## 2023-05-20 ENCOUNTER — Ambulatory Visit (HOSPITAL_COMMUNITY)
Admission: RE | Admit: 2023-05-20 | Discharge: 2023-05-20 | Disposition: A | Discharge status: Home / Self Care | Source: Ambulatory Visit | Attending: Cardiology | Admitting: Cardiology

## 2023-05-20 VITALS — BP 110/68 | HR 72 | Wt 179.6 lb

## 2023-05-20 DIAGNOSIS — Z953 Presence of xenogenic heart valve: Secondary | ICD-10-CM | POA: Diagnosis not present

## 2023-05-20 DIAGNOSIS — Z950 Presence of cardiac pacemaker: Secondary | ICD-10-CM | POA: Diagnosis not present

## 2023-05-20 DIAGNOSIS — Z79899 Other long term (current) drug therapy: Secondary | ICD-10-CM | POA: Insufficient documentation

## 2023-05-20 DIAGNOSIS — I340 Nonrheumatic mitral (valve) insufficiency: Secondary | ICD-10-CM

## 2023-05-20 DIAGNOSIS — Z8571 Personal history of Hodgkin lymphoma: Secondary | ICD-10-CM | POA: Insufficient documentation

## 2023-05-20 DIAGNOSIS — I2510 Atherosclerotic heart disease of native coronary artery without angina pectoris: Secondary | ICD-10-CM | POA: Diagnosis not present

## 2023-05-20 DIAGNOSIS — Z951 Presence of aortocoronary bypass graft: Secondary | ICD-10-CM | POA: Diagnosis not present

## 2023-05-20 DIAGNOSIS — I5022 Chronic systolic (congestive) heart failure: Secondary | ICD-10-CM | POA: Diagnosis not present

## 2023-05-20 DIAGNOSIS — Z95810 Presence of automatic (implantable) cardiac defibrillator: Secondary | ICD-10-CM | POA: Insufficient documentation

## 2023-05-20 DIAGNOSIS — C3490 Malignant neoplasm of unspecified part of unspecified bronchus or lung: Secondary | ICD-10-CM | POA: Insufficient documentation

## 2023-05-20 DIAGNOSIS — Z952 Presence of prosthetic heart valve: Secondary | ICD-10-CM | POA: Diagnosis not present

## 2023-05-20 NOTE — Progress Notes (Signed)
 ADVANCED HEART FAILURE NEW PATIENT CLINIC NOTE  Referring Physician: [name], MD  Primary Care: [name], MD Primary Cardiologist:  HPI: Tyler Hendrix is a 68 y.o. male with a PMH of CAD s/p CABG, HFrEF s/p ICD, newly diagnosed Stage IV lung cancer, history of Hodgkin's lymphoma s/p chemoradiation, bovine mitral valve who presents for initial visit for further evaluation and treatment of heart failure/cardiomyopathy.      Patient previously followed in general cardiology clinic starting around 2017.  He had a prior history of chemotherapy including anthracyclines as well as radiation of the chest wall.  He ended up requiring CABG in 2019 for his bovine mitral valve replacement.  He eventually underwent biventricular ICD placement which was complicated by difficulty placing the LV lead and has since had revision.  His ejection fraction remained low, usually around the 30 to 35% range.  Unfortunately developed stage IV lung cancer that was diagnosed in October 2024.  He has since been started on chemotherapy including Keytruda for palliation.  Was referred to heart failure clinic given persistently low EF.     SUBJECTIVE: Patient overall is doing well. He is encouraged by the response of his cancer to chemotherapy.  He still walks 20 minutes/day at a brisk pace and is able to complete all his activities of daily living.  He recently underwent recurrent laryngeal nerve procedure which has improved his speaking ability greatly.  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  He denies any recurrent chest pain, orthopnea, PND, lower extremity swelling.  PMH, current medications, allergies, social history, and family history reviewed in epic.  PHYSICAL EXAM: Vitals:   05/20/23 0939  BP: 110/68  Pulse: 72  SpO2: 98%   GENERAL: No apparent distress, mild fat wasting PULM:  Normal work of breathing, clear to auscultation bilaterally. Respirations are  unlabored.  CARDIAC:  JVP: Flat         Normal rate with regular rhythm. No murmurs, rubs or gallops.  No edema. Warm and well perfused extremities. ABDOMEN: Soft, non-tender, non-distended. NEUROLOGIC: Patient is oriented x3 with no focal or lateralizing neurologic deficits.    DATA REVIEW  ECG: /10/2023: AV dual paced rhythm with biventricular pacemaker.  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  Does not appear to have had radiation coronary disease.  Symptoms are stable NYHA class II, no volume symptoms. -Chronic HFrEF well compensated on current therapy. Can continue chemotherapy - No volume symptoms and not interested in additional medications. Hold on SGLT-2 - Continue entresto 24/26mg  BID, spironolactone 12.5mg  daily, carvedilol 6.25mg  BID - Labs at next visit - BiV pacing appropriately  CAD s/p CABG: Mitral valve replacement as well. No further anginal symptoms. - Conitnue aspirin, statin  Stage IV lung cancer: On Keytruda, palliative but has had good response thus far.  - Continue current therapy, on good cardiac medications with excellent functional status  Follow up  in 3 months  I spent 50 minutes in chart review including personal review of prior angiograms and echocardiograms, review of recent oncology records, discussion with the patient, and documentation and arranging  follow-ups.  Clearnce Hasten, MD Advanced Heart Failure Mechanical Circulatory Support 05/21/23

## 2023-05-20 NOTE — Progress Notes (Signed)
 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 He was diagnosed in October 2024.  He presented with metabolic central left upper lobe lung mass invading the mediastinum, small hypermetabolic right hilar lymph nodes, bilateral adrenal gland masses and splenic lesion consistent metastatic disease, hypermetabolic focus in the right gluteus maximus muscle.    Molecular studies by [employer] show MSI high.    Foundation One requested on 12/22/22   PDL1: 98%  PRIOR THERAPY: None  CURRENT THERAPY:  Immunotherapy with Keytruda 200 mg IV every 3 weeks.  First dose expected on 12/29/22. Status post 7 cycles.   INTERVAL HISTORY: Tyler Hendrix 68 y.o. male returns to the clinic today for a follow-up visit accompanied by his wife.  The patient is a never smoker was recently found to have metastatic cancer, felt to be poorly differentiated non-small cell lung cancer. He was found to have MSI high. Therefore, he started on treatment with immunotherapy with Keytruda. He has been tolerating treatment well.   Denies any fever, chills, or night sweats. Today, he states his breathing is "fine". He denies unusual cough. He denies any chest pain.  He denies any hemoptysis.  He did have one episode of loose stool a few days ago but this was self limiting and denies any abdominal pain or blood in the stool. He denies any nausea, vomiting, or constipation. Denies any headaches. He continues to have itching but no rashes. His blood pressure is a little low today. He is on coreg, entresto, and aldactone. He does not take his coreg every day but mentions he feels better on the days he does not take it. He has a blood pressure cuff at home. He recently had an appointment with cardiology per chart review.  He is here for evaluation and repeat blood work before undergoing cycle #8.     MEDICAL HISTORY: Past Medical History:  Diagnosis Date   Abnormal TSH    Arthritis    ddd   Atrial fibrillation and flutter (HCC)    s/p DCCV 03/26/2018   CAD in native artery    a. 11/2015: s/p 2 vessel PCI of the first diagonal branch of the LAD in the mid left circumflex with DES. // s/p CABG 01/2018   Chronic combined systolic and diastolic CHF (congestive heart failure) (HCC)    sees Dr. Nunzio Belch    Complete heart block (HCC)    a. s/p Medtronic ppm 10/2015.   Dyspnea    with exertion   Family history of bladder cancer    Family history of prostate cancer    Family history of stomach cancer    History of radiation therapy 2002   Hodgkin disease (HCC) 2002   a. s/p chest radiation and chemotherapy   Hypertension    Hypothyroidism    sees Dr. Aldona Amel    Incidental pulmonary nodule 01/19/2018   Left apical opacity - possible scarring   Inguinal hernia 10/14/2022   bilateral   Mitral regurgitation    s/p bioprosthetic MV replacement 01/2018   Non-small cell lung cancer (NSCLC) (HCC) 11/2022   Pacemaker 10/2015   Medtronic   PVC's (premature ventricular contractions)    Right bundle branch block    S/P CABG x 2 01/27/2018   LIMA to LAD, SVG to RCA, EVH via right thigh   S/P mitral valve replacement with bioprosthetic valve  01/27/2018   29 mm [facility] Mitral stented bovine pericardial tissue valve    ALLERGIES:  is allergic to other.  MEDICATIONS:  Current Outpatient Medications  Medication Sig Dispense Refill   aspirin EC 81 MG tablet Take 81 mg by mouth daily.     atorvastatin (LIPITOR) 40 MG tablet Take 1 tablet (40 mg total) by mouth daily. 90 tablet 3   carvedilol (COREG) 6.25 MG tablet Take 1 tablet (6.25 mg total) by mouth 2 (two) times daily with a meal. 180 tablet 3   Cholecalciferol 125 MCG (5000 UT) TABS Take 1 tablet by mouth daily. 30 tablet 6   levothyroxine (SYNTHROID) 75 MCG tablet Take 1 tablet (75 mcg total) by mouth daily before breakfast.  90 tablet 3   sacubitril-valsartan (ENTRESTO) 24-26 MG Take 1 tablet by mouth 2 (two) times daily. 180 tablet 3   spironolactone (ALDACTONE) 25 MG tablet Take one-half tablet (12.5 mg total) by mouth daily. 45 tablet 3   traZODone (DESYREL) 50 MG tablet Take 1 tablet (50 mg total) by mouth at bedtime as needed for sleep. 90 tablet 1   No current facility-administered medications for this visit.   Facility-Administered Medications Ordered in Other Visits  Medication Dose Route Frequency Provider Last Rate Last Admin   0.9 %  sodium chloride infusion   Intravenous Continuous Marlene Simas, MD       heparin lock flush 100 unit/mL  500 Units Intracatheter Once PRN Marlene Simas, MD       pembrolizumab [facility]) 200 mg in sodium chloride 0.9 % 50 mL chemo infusion  200 mg Intravenous Once Mohamed, Mohamed, MD       sodium chloride flush (NS) 0.9 % injection 10 mL  10 mL Intracatheter PRN Marlene Simas, MD        SURGICAL HISTORY:  Past Surgical History:  Procedure Laterality Date   A-FLUTTER ABLATION N/A 06/20/2021   Procedure: A-FLUTTER ABLATION;  Surgeon: Tammie Fall, MD;  Location: [facility] INVASIVE CV LAB;  Service: Cardiovascular;  Laterality: N/A;   BIV UPGRADE N/A 02/08/2019   Procedure: UPGRADE TO BIV PPM;  Surgeon: Jolly Needle, MD;  Location: [facility] INVASIVE CV LAB;  Service: Cardiovascular;  Laterality: N/A;   BRONCHIAL BIOPSY  12/02/2022   Procedure: BRONCHIAL BIOPSIES;  Surgeon: Denson Flake, MD;  Location: [facility] ENDOSCOPY;  Service: Pulmonary;;   BRONCHIAL BRUSHINGS  12/02/2022   Procedure: BRONCHIAL BRUSHINGS;  Surgeon: Denson Flake, MD;  Location: [facility] ENDOSCOPY;  Service: Pulmonary;;   BRONCHIAL NEEDLE ASPIRATION BIOPSY  12/02/2022   Procedure: BRONCHIAL NEEDLE ASPIRATION BIOPSIES;  Surgeon: Denson Flake, MD;  Location: [facility] ENDOSCOPY;  Service: Pulmonary;;   BRONCHIAL WASHINGS  12/02/2022   Procedure: BRONCHIAL WASHINGS;  Surgeon: Denson Flake, MD;  Location: [facility]  ENDOSCOPY;  Service: Pulmonary;;   CARDIAC CATHETERIZATION N/A 11/02/2015   Procedure: Left Heart Cath and Coronary Angiography;  Surgeon: Arnoldo Lapping, MD;  Location: [facility] INVASIVE CV LAB;  Service: Cardiovascular;  Laterality: N/A;   CARDIAC CATHETERIZATION N/A 11/21/2015   Procedure: Coronary Stent Intervention;  Surgeon: Arnoldo Lapping, MD;  Location: [facility] INVASIVE CV LAB;  Service: Cardiovascular;  Laterality: N/A;   CARDIAC CATHETERIZATION  11/2017   CARDIOVERSION N/A 03/26/2018   Procedure: CARDIOVERSION;  Surgeon: Euell Herrlich, MD;  Location: [facility] ENDOSCOPY;  Service: Cardiovascular;  Laterality: N/A;   COLONOSCOPY  01/16/2015   per Dr. Howard Macho, benign polyps, repeat in 10 yrs    CORONARY ARTERY BYPASS GRAFT N/A 01/27/2018  Procedure: CORONARY ARTERY BYPASS GRAFTING (CABG) x two, using left internal mammary artery and right leg greater saphenous vein harvested endoscopically;  Surgeon: Gardenia Jump, MD;  Location: [facility] OR;  Service: Open Heart Surgery;  Laterality: N/A;   DIRECT LARYNGOSCOPY Bilateral 04/20/2023   Procedure: LARYNGOSCOPY, MICRO DIRECT WITH PROLARYN INJECTION;  Surgeon: Virgina Grills, MD;  Location: [facility] OR;  Service: ENT;  Laterality: Bilateral;   EP IMPLANTABLE DEVICE N/A 11/02/2015   MDT Adivisa MRI conditional dual chamber pacemaker implanted by Dr Nunzio Belch for complete heart block   FEMUR IM NAIL Left 07/01/2022   Procedure: INTRAMEDULLARY (IM) NAIL FEMORAL LEFT ANTEGRADE;  Surgeon: Hardy Lia, MD;  Location: [facility] OR;  Service: Orthopedics;  Laterality: Left;   IR IMAGING GUIDED PORT INSERTION  01/02/2023   KNEE SURGERY Right 1972   LEAD EXTRACTION N/A 10/20/2022   Procedure: LEAD EXTRACTION;  Surgeon: Tammie Fall, MD;  [facility]: [facility] INVASIVE CV LAB;  Service: Cardiovascular;  Laterality: N/A;   LUMBAR LAMINECTOMY  1996   MITRAL VALVE REPLACEMENT N/A 01/27/2018   Procedure: MITRAL VALVE (MV) REPLACEMENT using Magna Mitral Ease Valve Size ;  Surgeon: Gardenia Jump, MD;  Location: [facility] OR;  Service: Open Heart Surgery;  Laterality: N/A;   RIGHT/LEFT HEART CATH AND CORONARY ANGIOGRAPHY N/A 12/02/2017   Procedure: RIGHT/LEFT HEART CATH AND CORONARY ANGIOGRAPHY;  Surgeon: Lucendia Rusk, MD;  Location: [facility] INVASIVE CV LAB;  Service: Cardiovascular;  Laterality: N/A;   TEE WITHOUT CARDIOVERSION N/A 12/09/2017   Procedure: TRANSESOPHAGEAL ECHOCARDIOGRAM (TEE);  Surgeon: Sheryle Donning, MD;  Location: [facility] ENDOSCOPY;  Service: Cardiovascular;  Laterality: N/A;   TEE WITHOUT CARDIOVERSION N/A 01/27/2018   Procedure: TRANSESOPHAGEAL ECHOCARDIOGRAM (TEE);  Surgeon: Gardenia Jump, MD;  Location: [facility] OR;  Service: Open Heart Surgery;  Laterality: N/A;   VIDEO BRONCHOSCOPY WITH RADIAL ENDOBRONCHIAL ULTRASOUND  12/02/2022   Procedure: VIDEO BRONCHOSCOPY WITH RADIAL ENDOBRONCHIAL ULTRASOUND;  Surgeon: Denson Flake, MD;  Location: [facility] ENDOSCOPY;  Service: Pulmonary;;    REVIEW OF SYSTEMS:   Constitutional: Negative for appetite change, chills, fatigue, fever and unexpected weight change.  HENT: Negative for mouth sores, nosebleeds, sore throat and trouble swallowing.   Eyes: Negative for eye problems and icterus.  Respiratory: Negative for cough, hemoptysis, shortness of breath and wheezing.   Cardiovascular: Negative for chest pain and leg swelling.  Gastrointestinal: Negative for abdominal pain, constipation, diarrhea (resolved), nausea and vomiting.  Genitourinary: Negative for bladder incontinence, difficulty urinating, dysuria, frequency and hematuria.   Musculoskeletal: Negative for back pain, gait problem, neck pain and neck stiffness.  Skin: Positive for itching without rash.  Neurological: Negative for dizziness, extremity weakness, gait problem, headaches, light-headedness and seizures.  Hematological: Negative for adenopathy. Does not bruise/bleed easily.  Psychiatric/Behavioral: Negative for confusion, depression and sleep  disturbance. The patient is not nervous/anxious   PHYSICAL EXAMINATION:  Blood pressure (!) 102/58, pulse 65, temperature 97.6 F (36.4 C), temperature source Temporal, resp. rate 18, height 5\' 11"  (1.803 m), weight 179 lb 4.8 oz (81.3 kg), SpO2 98%.  ECOG PERFORMANCE STATUS: 1  Physical Exam  Constitutional: Oriented to person, place, and time and thin appearing male, and in no distress.  HENT:  Head: Normocephalic and atraumatic.  Mouth/Throat: Oropharynx is clear and moist. No oropharyngeal exudate.  Eyes: Conjunctivae are normal. Right eye exhibits no discharge. Left eye exhibits no discharge. No scleral icterus.  Neck: Normal range of motion. Neck supple.  Cardiovascular: Normal rate, regular rhythm, normal heart sounds and intact  distal pulses.   Pulmonary/Chest: Effort normal and breath sounds normal. No respiratory distress. No wheezes. No rales.  Abdominal: Soft. Bowel sounds are normal. Exhibits no distension and no mass. There is no tenderness.  Musculoskeletal: Normal range of motion. Exhibits no edema.  Lymphadenopathy:    No cervical adenopathy.  Neurological: Alert and oriented to person, place, and time. Exhibits normal muscle tone. Gait normal. Coordination normal.  Skin: Skin is warm and dry. No rash noted. Not diaphoretic. No erythema. No pallor.  Psychiatric: Mood, memory and judgment normal.  Vitals reviewed.  LABORATORY DATA: Lab Results  Component Value Date   WBC 8.6 05/25/2023   HGB 12.4 (L) 05/25/2023   HCT 36.0 (L) 05/25/2023   MCV 95.7 05/25/2023   PLT 251 05/25/2023      Chemistry      Component Value Date/Time   NA 135 05/25/2023 1045   NA 137 06/06/2021 0919   K 4.7 05/25/2023 1045   CL 103 05/25/2023 1045   CO2 27 05/25/2023 1045   BUN 18 05/25/2023 1045   BUN 19 06/06/2021 0919   CREATININE 1.13 05/25/2023 1045   CREATININE 1.04 01/01/2016 0801      Component Value Date/Time   CALCIUM 9.0 05/25/2023 1045   ALKPHOS 68 05/25/2023 1045    AST 23 05/25/2023 1045   ALT 20 05/25/2023 1045   BILITOT 1.2 05/25/2023 1045       RADIOGRAPHIC STUDIES:  ECHOCARDIOGRAM COMPLETE Result Date: 05/07/2023    ECHOCARDIOGRAM REPORT   Patient Name:   LATRAVIOUS LEVITT Date of Exam: 05/07/2023 Medical Rec #:  [id number]        Height:       71.0 in Accession #:    [id number]       Weight:       182.7 lb Date of Birth:  1955-12-02        BSA:          2.029 m Patient Age:    68 years         BP:           120/72 mmHg Patient Gender: M                HR:           60 bpm. Exam Location:  [address] Procedure: 2D Echo, 3D Echo and Strain Analysis (Both Spectral and Color Flow            Doppler were utilized during procedure). Indications:     I50.9* Heart failure (unspecified)  History:         Patient has prior history of Echocardiogram examinations, most                  recent 10/20/2022. CAD, Prior CABG, Abnormal ECG and Pacemaker,                  Arrythmias:PVC, Atrial Fibrillation and Atrial Flutter; Risk                  Factors:Hypertension. Mitral valve replacement. Complete heart                  block. Palpitations. Hodgkin's lymphoma. Chest wall radiation.                  Lung cancer. Hypothyroidism. Dyspnea on exertion.  Sonographer:     Mylinda Asa RCS Referring Phys:  [id number] Aviva Lemmings HILTY Diagnosing Phys: Jules Oar MD IMPRESSIONS  1. Left ventricular ejection fraction, by estimation, is 25%. The left ventricle has severely decreased function. The left ventricle demonstrates global hypokinesis. The left ventricular internal cavity size was moderately dilated. Left ventricular diastolic parameters are indeterminate. The average left ventricular global longitudinal strain is -15.8 %.  2. Right ventricular systolic function is severely reduced. The right ventricular size is mildly enlarged.  3. The mitral valve has been repaired/replaced. Trivial mitral valve regurgitation. Mild mitral stenosis. The mean mitral valve gradient is 4.0  mmHg. Echo findings are consistent with normal structure and function of the mitral valve prosthesis.  4. The aortic valve is tricuspid. There is mild calcification of the aortic valve. Aortic valve regurgitation is not visualized. Aortic valve sclerosis/calcification is present, without any evidence of aortic stenosis.  5. The inferior vena cava is normal in size with greater than 50% respiratory variability, suggesting right atrial pressure of 3 mmHg. FINDINGS  Left Ventricle: Left ventricular ejection fraction, by estimation, is 25%. The left ventricle has severely decreased function. The left ventricle demonstrates global hypokinesis. The average left ventricular global longitudinal strain is -15.8 %. Strain  was performed and the global longitudinal strain is indeterminate. The left ventricular internal cavity size was moderately dilated. There is no left ventricular hypertrophy. Abnormal (paradoxical) septal motion consistent with post-operative status. Left ventricular diastolic parameters are indeterminate. Right Ventricle: The right ventricular size is mildly enlarged. No increase in right ventricular wall thickness. Right ventricular systolic function is severely reduced. Left Atrium: Left atrial size was normal in size. Right Atrium: Right atrial size was normal in size. Pericardium: There is no evidence of pericardial effusion. Mitral Valve: The mitral valve has been repaired/replaced. Trivial mitral valve regurgitation. There is a 29 mm bioprosthetic valve present in the mitral position. Echo findings are consistent with normal structure and function of the mitral valve prosthesis. Mild mitral valve stenosis. MV peak gradient, 8.2 mmHg. The mean mitral valve gradient is 4.0 mmHg. Tricuspid Valve: The tricuspid valve is normal in structure. Tricuspid valve regurgitation is mild . No evidence of tricuspid stenosis. Aortic Valve: The aortic valve is tricuspid. There is mild calcification of the aortic  valve. Aortic valve regurgitation is not visualized. Aortic valve sclerosis/calcification is present, without any evidence of aortic stenosis. Pulmonic Valve: The pulmonic valve was normal in structure. Pulmonic valve regurgitation is mild. No evidence of pulmonic stenosis. Aorta: The aortic root is normal in size and structure. Venous: The inferior vena cava is normal in size with greater than 50% respiratory variability, suggesting right atrial pressure of 3 mmHg. IAS/Shunts: No atrial level shunt detected by color flow Doppler. Additional Comments: 3D was performed not requiring image post processing on an independent workstation and was abnormal.  LEFT VENTRICLE PLAX 2D LVIDd:         6.20 cm LVIDs:         5.10 cm   2D Longitudinal Strain LV PW:         1.20 cm   2D Strain GLS (A4C):   -17.6 % LV IVS:        1.00 cm   2D Strain GLS [facility]):   -15.0 % LVOT diam:     2.00 cm   2D Strain GLS (A2C):   -14.8 % LV SV:         55        2D Strain GLS Avg:     -15.8 % LV SV Index:   27 LVOT Area:     3.14  cm                           3D Volume EF:                          3D EF:        36 %                          LV EDV:       250 ml                          LV ESV:       160 ml                          LV SV:        89 ml RIGHT VENTRICLE RV S prime:     3.37 cm/s TAPSE (M-mode): 0.9 cm RVSP:           16.8 mmHg LEFT ATRIUM             Index        RIGHT ATRIUM           Index LA diam:        3.40 cm 1.68 cm/m   RA Pressure: 3.00 mmHg LA Vol (A2C):   41.0 ml 20.21 ml/m  RA Area:     12.70 cm LA Vol (A4C):   36.1 ml 17.79 ml/m  RA Volume:   22.40 ml  11.04 ml/m LA Biplane Vol: 39.7 ml 19.57 ml/m  AORTIC VALVE LVOT Vmax:   82.80 cm/s LVOT Vmean:  49.800 cm/s LVOT VTI:    0.175 m  AORTA Ao Root diam: 3.50 cm Ao Asc diam:  3.40 cm MITRAL VALVE                TRICUSPID VALVE MV Area (PHT): 1.86 cm     TR Peak grad:   13.8 mmHg MV Area VTI:   0.94 cm     TR Vmax:        186.00 cm/s MV Peak grad:  8.2 mmHg      Estimated RAP:  3.00 mmHg MV Mean grad:  4.0 mmHg     RVSP:           16.8 mmHg MV Vmax:       1.43 m/s MV Vmean:      93.5 cm/s    SHUNTS MV Decel Time: 408 msec     Systemic VTI:  0.18 m MV E velocity: 115.00 cm/s  Systemic Diam: 2.00 cm MV A velocity: 127.00 cm/s MV E/A ratio:  0.91 Jules Oar MD Electronically signed by Jules Oar MD Signature Date/Time: 05/07/2023/2:48:16 PM    Final (Updated)    CT CHEST ABDOMEN PELVIS W CONTRAST Result Date: 05/03/2023 CLINICAL DATA:  Metastatic non-small cell lung cancer restaging, additional history of Hodgkin's lymphoma * Tracking Code: BO * EXAM: CT CHEST, ABDOMEN, AND PELVIS WITH CONTRAST TECHNIQUE: Multidetector CT imaging of the chest, abdomen and pelvis was performed following the standard protocol during bolus administration of intravenous contrast. RADIATION DOSE REDUCTION: This exam was performed according to the departmental dose-optimization program which includes automated exposure control, adjustment of the mA and/or kV according to patient size and/or use of iterative reconstruction technique. CONTRAST:  100mL OMNIPAQUE IOHEXOL 300 MG/ML SOLN additional oral enteric contrast COMPARISON:  02/23/2023 FINDINGS: CT CHEST FINDINGS Cardiovascular: Right chest port catheter. Aortic atherosclerosis. Left chest multi lead pacer. Cardiomegaly. Mitral valve prosthesis. Three-vessel coronary artery calcifications and stents status post median sternotomy and CABG. No pericardial effusion. Mediastinum/Nodes: Small benign calcified subcarinal lymph nodes (series 2, image 35). No enlarged mediastinal, hilar, or axillary lymph nodes. Thyroid gland, trachea, and esophagus demonstrate no significant findings. Lungs/Pleura: Continued slight decrease in size of a spiculated mass of the paramedian suprahilar left upper lobe, with soft tissue invasion of the adjacent prevascular space measuring 2.3 x 1.5 cm, previously 2.6 x 2.2 cm (series 2, image 22). Mild diffuse  bilateral bronchial wall thickening. Background of fine centrilobular pulmonary nodules, most concentrated in the lung apices. Diminished tiny nodule of the dependent right lower lobe, measuring 0.2 cm (series 5, image 119). Unchanged tiny nodule of the dependent left lower lobe measuring 0.3 cm, possibly faintly calcified (series 5, image 129). Additional tiny, benign calcified nodules in the left lung base. No pleural effusion or pneumothorax. Musculoskeletal: No chest wall abnormality. No acute osseous findings. CT ABDOMEN PELVIS FINDINGS Hepatobiliary: No solid liver abnormality is seen. Hepatic steatosis. Tiny gallstone (series 6, image 51). Gallbladder wall thickening, or biliary dilatation. Pancreas: Unremarkable. No pancreatic ductal dilatation or surrounding inflammatory changes. Spleen: Normal in size without significant abnormality. Adrenals/Urinary Tract: Continued decrease in size of a left adrenal metastasis measuring 1.4 x 0.9 cm, previously 1.9 x 1.2 cm (series 2, image 66). Mild persistent thickening of the right adrenal gland without discretely measurable residual metastasis (series 2, image 64). Kidneys are normal, without renal calculi, solid lesion, or hydronephrosis. Bladder is unremarkable. Stomach/Bowel: Stomach is within normal limits. Appendix not clearly visualized. No evidence of bowel wall thickening, distention, or inflammatory changes. Vascular/Lymphatic: Aortic atherosclerosis. No enlarged abdominal or pelvic lymph nodes. Reproductive: Prostatomegaly. Other: Small bilateral inguinal hernias containing fat and partial nonobstructed loops of sigmoid colon on the left and distal small bowel in the right (series 2, image 124). No ascites. Musculoskeletal: No acute osseous findings. IMPRESSION: 1. Continued slight decrease in size of a spiculated left upper lobe mass. 2. 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 Mild persistent thickening of the right adrenal gland without  discretely measurable residual metastasis. 3. Diminished tiny pulmonary nodule of the dependent right lower lobe, likely infectious or inflammatory. Other small nodules unchanged, particularly in the left lung base. 4. Cardiomegaly and coronary artery disease. 5. Hepatic steatosis. 6. Cholelithiasis. Aortic Atherosclerosis (ICD10-I70.0). Electronically Signed   By: Fredricka Jenny M.D.   On: 05/03/2023 21:52     ASSESSMENT/PLAN:  This is a very pleasant 68 year old never smoker Caucasian male with stage IV lung cancer, poorly differentiated carcinoma, felt to be non-small cell lung cancer. He was diagnosed in October 2024.  He presented with metabolic central left upper lobe lung mass invading the mediastinum, small hypermetabolic right hilar lymph nodes, bilateral adrenal gland masses and splenic lesion consistent metastatic disease, hypermetabolic focus in the right gluteus maximus muscle.  His molecular studies show he is MSI high    He is currently undergoing palliative immunotherapy with Keytruda 200 mg IV every 3 weeks. He is status post 7 cycles and tolerates it well.    Labs were reviewed. Recommend that he proceed with cycle #8 today as scheduled.    We will see him back in 3 weeks for evaluation and repeat blood work before undergoing cycle #9   Patient  also has a history of lymphoma and he did have radiation to the chest for Hodgkin's lymphoma in 2002 and chemotherapy.  He was previously followed by Dr. Maria Shiner.   We will scan a copy of his drug assistance enrollment in the chart.    He can use Claritin if needed for itching.    He will continue for now to have his PSA checked every 6 months or so with his urologist or PCP.   He will monitor his BP closely at home and keep a log of his readings and write down if he took all his medications, entresto, coreg, and aldactone. If he has hypotension when he takes all 3, then he may want to check with his cardiologist if dose modification is  needed. He drinks plenty of fluids.    The patient was advised to call immediately if he has any concerning symptoms in the interval. The patient voices understanding of current disease status and treatment options and is in agreement with the current care plan. All questions were answered. The patient knows to call the clinic with any problems, questions or concerns. We can certainly see the patient much sooner if necessary   No orders of the defined types were placed in this encounter.    The total time spent in the appointment was 20-29 minutes  Glenisha Gundry L Amarachi Kotz, PA-C 05/25/23

## 2023-05-20 NOTE — Patient Instructions (Signed)
 Good to see you today!  Your physician recommends that you schedule a follow-up appointment in: 3 months(July) Call office in May to schedule an appointment  If you have any questions or concerns before your next appointment please send [place] a message through [place] or call our office at [phone].    TO LEAVE A MESSAGE FOR THE NURSE SELECT OPTION 2, PLEASE LEAVE A MESSAGE INCLUDING: YOUR NAME DATE OF BIRTH CALL BACK NUMBER REASON FOR CALL**this is important as we prioritize the call backs  YOU WILL RECEIVE A CALL BACK THE SAME DAY AS LONG AS YOU CALL BEFORE 4:00 PM At the [facility], you and your health needs are our priority. As part of our continuing mission to provide you with exceptional heart care, we have created designated Provider Care Teams. These Care Teams include your primary Cardiologist (physician) and Advanced Practice Providers (APPs- Physician Assistants and Nurse Practitioners) who all work together to provide you with the care you need, when you need it.   You may see any of the following providers on your designated Care Team at your next follow up: Dr Arvilla Meres Dr Marca Ancona Dr. Dorthula Nettles Dr. Clearnce Hasten Amy Filbert Schilder, NP Robbie Lis, [place] [facility] [place], [place] Brynda Peon, NP [place] Lee, NP Clarisa Kindred, NP Karle Plumber, PharmD Enos Fling, PharmD   Please be sure to bring in all your medications bottles to every appointment.    Thank you for choosing [facility]

## 2023-05-25 ENCOUNTER — Inpatient Hospital Stay: Payer: 59 | Attending: Physician Assistant

## 2023-05-25 ENCOUNTER — Inpatient Hospital Stay: Payer: 59

## 2023-05-25 ENCOUNTER — Inpatient Hospital Stay (HOSPITAL_BASED_OUTPATIENT_CLINIC_OR_DEPARTMENT_OTHER): Payer: 59 | Admitting: Physician Assistant

## 2023-05-25 VITALS — BP 102/58 | HR 65 | Temp 97.6°F | Resp 18 | Ht 71.0 in | Wt 179.3 lb

## 2023-05-25 DIAGNOSIS — C3412 Malignant neoplasm of upper lobe, left bronchus or lung: Secondary | ICD-10-CM | POA: Diagnosis not present

## 2023-05-25 DIAGNOSIS — Z79899 Other long term (current) drug therapy: Secondary | ICD-10-CM | POA: Insufficient documentation

## 2023-05-25 DIAGNOSIS — Z8571 Personal history of Hodgkin lymphoma: Secondary | ICD-10-CM | POA: Insufficient documentation

## 2023-05-25 DIAGNOSIS — Z9221 Personal history of antineoplastic chemotherapy: Secondary | ICD-10-CM | POA: Insufficient documentation

## 2023-05-25 DIAGNOSIS — Z5112 Encounter for antineoplastic immunotherapy: Secondary | ICD-10-CM | POA: Diagnosis not present

## 2023-05-25 DIAGNOSIS — C3492 Malignant neoplasm of unspecified part of left bronchus or lung: Secondary | ICD-10-CM

## 2023-05-25 DIAGNOSIS — Z7982 Long term (current) use of aspirin: Secondary | ICD-10-CM | POA: Diagnosis not present

## 2023-05-25 DIAGNOSIS — C7971 Secondary malignant neoplasm of right adrenal gland: Secondary | ICD-10-CM | POA: Insufficient documentation

## 2023-05-25 DIAGNOSIS — C7972 Secondary malignant neoplasm of left adrenal gland: Secondary | ICD-10-CM | POA: Insufficient documentation

## 2023-05-25 DIAGNOSIS — Z95828 Presence of other vascular implants and grafts: Secondary | ICD-10-CM

## 2023-05-25 LAB — CMP (CANCER CENTER ONLY)
ALT: 20 U/L (ref 0–44)
AST: 23 U/L (ref 15–41)
Albumin: 4.4 g/dL (ref 3.5–5.0)
Alkaline Phosphatase: 68 U/L (ref 38–126)
Anion gap: 5 (ref 5–15)
BUN: 18 mg/dL (ref 8–23)
CO2: 27 mmol/L (ref 22–32)
Calcium: 9 mg/dL (ref 8.9–10.3)
Chloride: 103 mmol/L (ref 98–111)
Creatinine: 1.13 mg/dL (ref 0.61–1.24)
GFR, Estimated: >60 mL/min (ref >60)
Glucose, Bld: 99 mg/dL (ref 70–99)
Potassium: 4.7 mmol/L (ref 3.5–5.1)
Sodium: 135 mmol/L (ref 135–145)
Total Bilirubin: 1.2 mg/dL (ref 0.0–1.2)
Total Protein: 6.6 g/dL (ref 6.5–8.1)

## 2023-05-25 LAB — CBC WITH DIFFERENTIAL (CANCER CENTER ONLY)
Abs Immature Granulocytes: 0.03 10*3/uL (ref 0.00–0.07)
Basophils Absolute: 0 10*3/uL (ref 0.0–0.1)
Basophils Relative: 1 %
Eosinophils Absolute: 0.1 10*3/uL (ref 0.0–0.5)
Eosinophils Relative: 1 %
HCT: 36 % — ABNORMAL LOW (ref 39.0–52.0)
Hemoglobin: 12.4 g/dL — ABNORMAL LOW (ref 13.0–17.0)
Immature Granulocytes: 0 %
Lymphocytes Relative: 10 %
Lymphs Abs: 0.9 10*3/uL (ref 0.7–4.0)
MCH: 33 pg (ref 26.0–34.0)
MCHC: 34.4 g/dL (ref 30.0–36.0)
MCV: 95.7 fL (ref 80.0–100.0)
Monocytes Absolute: 0.5 10*3/uL (ref 0.1–1.0)
Monocytes Relative: 6 %
Neutro Abs: 7 10*3/uL (ref 1.7–7.7)
Neutrophils Relative %: 82 %
Platelet Count: 251 10*3/uL (ref 150–400)
RBC: 3.76 MIL/uL — ABNORMAL LOW (ref 4.22–5.81)
RDW: 12.9 % (ref 11.5–15.5)
WBC Count: 8.6 10*3/uL (ref 4.0–10.5)
nRBC: 0 % (ref 0.0–0.2)

## 2023-05-25 MED ORDER — SODIUM CHLORIDE 0.9% FLUSH
10.0000 mL | INTRAVENOUS | Status: DC | PRN
  Administered 2023-05-25: 10 mL

## 2023-05-25 MED ORDER — HEPARIN SOD (PORK) LOCK FLUSH 100 UNIT/ML IV SOLN
500.0000 [IU] | Freq: Once | INTRAVENOUS | Status: AC | PRN
  Administered 2023-05-25: 500 [IU]

## 2023-05-25 MED ORDER — SODIUM CHLORIDE 0.9% FLUSH
10.0000 mL | Freq: Once | INTRAVENOUS | Status: AC
Start: 2023-05-25 — End: 2023-05-25
  Administered 2023-05-25: 10 mL

## 2023-05-25 MED ORDER — SODIUM CHLORIDE 0.9 % IV SOLN
200.0000 mg | Freq: Once | INTRAVENOUS | Status: AC
  Administered 2023-05-25: 200 mg via INTRAVENOUS
  Filled 2023-05-25: qty 200

## 2023-05-25 MED ORDER — SODIUM CHLORIDE 0.9 % IV SOLN: INTRAVENOUS | Status: DC

## 2023-05-25 NOTE — Patient Instructions (Signed)

## 2023-06-02 ENCOUNTER — Other Ambulatory Visit: Payer: Self-pay | Admitting: Physician Assistant

## 2023-06-02 ENCOUNTER — Encounter: Payer: Self-pay | Admitting: Internal Medicine

## 2023-06-02 ENCOUNTER — Other Ambulatory Visit (HOSPITAL_COMMUNITY): Payer: Self-pay

## 2023-06-02 DIAGNOSIS — R197 Diarrhea, unspecified: Secondary | ICD-10-CM

## 2023-06-02 MED ORDER — METHYLPREDNISOLONE 4 MG PO TBPK
ORAL_TABLET | ORAL | 0 refills | Status: DC
Start: 2023-06-02 — End: 2023-10-19
  Filled 2023-06-02: qty 21, 6d supply, fill #0

## 2023-06-02 NOTE — Telephone Encounter (Signed)
 LM to call Dr Bing Buff office

## 2023-06-09 NOTE — Progress Notes (Signed)
 Remote pacemaker transmission.

## 2023-06-09 NOTE — Addendum Note (Signed)
 Addended by: Lott Rouleau A on: 06/09/2023 11:33 AM   Modules accepted: Orders

## 2023-06-10 ENCOUNTER — Encounter: Payer: Self-pay | Admitting: Cardiology

## 2023-06-10 NOTE — Progress Notes (Signed)
 [facility] OFFICE PROGRESS NOTE  Donley Furth, MD 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 He was diagnosed in October 2024.  He presented with metabolic central left upper lobe lung mass invading the mediastinum, small hypermetabolic right hilar lymph nodes, bilateral adrenal gland masses and splenic lesion consistent metastatic disease, hypermetabolic focus in the right gluteus maximus muscle.    Molecular studies by [employer] show MSI high.    Foundation One requested on 12/22/22   PDL1: 98%  PRIOR THERAPY:  None   CURRENT THERAPY:  Immunotherapy with Keytruda  200 mg IV every 3 weeks.  First dose expected on 12/29/22. Status post 8 cycles.   INTERVAL HISTORY: Tyler Hendrix 68 y.o. male returns to the clinic today for a follow-up visit.  The patient was last seen in the clinic by myself 3 weeks ago. The patient called in the interval endorsing diarrhea.   He experiences diarrhea every morning, typically once a day. An episode on Easter early AM involved diarrhea every couple of hours, but this has not recurred. He takes Imodium prophylactically at night, which did not help prevent his symptoms the next morning.   The last normal bowel movement was on Saturday night at 9:30 PM. Since then, diarrhea has persisted each morning. No associated abdominal pain or blood in the stool. The stool is brown in color. His last CT CAP showed stable small bilateral inguinal hernias containing fat and partial non-obstructed loops of sigmoid colon on the left and distal small bowel in the right. His diarrhea does improve with taking 2 imodium after his loose stool.   He has experienced a weight loss of ten pounds over the past two weeks. No fevers, chills, night sweats, changes in breathing, cough, chest pain, nausea, vomiting, or headaches. He has not started any new medications recently. Steroids helped  his bowel movements for a day or two, but the effect was not sustained.   The patient is followed by the clinic due to his metastatic cancer felt to be poorly differentiated non-small cell lung cancer.  He is found to MSI high, therefore, he started on single agent immunotherapy with Keytruda . He is here today for evaluation and repeat blood work before undergoing cycle #9.   MEDICAL HISTORY: Past Medical History:  Diagnosis Date   Abnormal TSH    Arthritis    ddd   Atrial fibrillation and flutter (HCC)    s/p DCCV 03/26/2018   CAD in native artery    a. 11/2015: s/p 2 vessel PCI of the first diagonal branch of the LAD in the mid left circumflex with DES. // s/p CABG 01/2018   Chronic combined systolic and diastolic CHF (congestive heart failure) (HCC)    sees Dr. Nunzio Belch    Complete heart block (HCC)    a. s/p Medtronic ppm 10/2015.   Dyspnea    with exertion   Family history of bladder cancer    Family history of prostate cancer    Family history of stomach cancer    History of radiation therapy 2002   Hodgkin disease (HCC) 2002   a. s/p chest radiation and chemotherapy   Hypertension    Hypothyroidism    sees Dr. Aldona Amel    Incidental pulmonary nodule 01/19/2018   Left apical opacity - possible scarring   Inguinal hernia 10/14/2022   bilateral   Mitral regurgitation    s/p bioprosthetic MV replacement  01/2018   Non-small cell lung cancer (NSCLC) (HCC) 11/2022   Pacemaker 10/2015   Medtronic   PVC's (premature ventricular contractions)    Right bundle branch block    S/P CABG x 2 01/27/2018   LIMA to LAD, SVG to RCA, EVH via right thigh   S/P mitral valve replacement with bioprosthetic valve 01/27/2018   29 mm [facility] Mitral stented bovine pericardial tissue valve    ALLERGIES:  is allergic to other.  MEDICATIONS:  Current Outpatient Medications  Medication Sig Dispense Refill   aspirin  EC 81 MG tablet Take 81 mg by mouth daily.     atorvastatin  (LIPITOR ) 40  MG tablet Take 1 tablet (40 mg total) by mouth daily. 90 tablet 3   Cholecalciferol  125 MCG (5000 UT) TABS Take 1 tablet by mouth daily. 30 tablet 6   levothyroxine  (SYNTHROID ) 75 MCG tablet Take 1 tablet (75 mcg total) by mouth daily before breakfast. 90 tablet 3   methylPREDNISolone  (MEDROL  DOSEPAK) 4 MG TBPK tablet Use as directed on package instructions 21 tablet 0   sacubitril -valsartan  (ENTRESTO ) 24-26 MG Take 1 tablet by mouth 2 (two) times daily. 180 tablet 3   spironolactone  (ALDACTONE ) 25 MG tablet Take one-half tablet (12.5 mg total) by mouth daily. 45 tablet 3   traZODone  (DESYREL ) 50 MG tablet Take 1 tablet (50 mg total) by mouth at bedtime as needed for sleep. 90 tablet 1   No current facility-administered medications for this visit.   Facility-Administered Medications Ordered in Other Visits  Medication Dose Route Frequency Provider Last Rate Last Admin   0.9 %  sodium chloride  infusion   Intravenous Continuous Marlene Simas, MD       heparin  lock flush 100 unit/mL  500 Units Intracatheter Once PRN Marlene Simas, MD       pembrolizumab  (KEYTRUDA ) 200 mg in sodium chloride  0.9 % 50 mL chemo infusion  200 mg Intravenous Once Marlene Simas, MD       sodium chloride  flush (NS) 0.9 % injection 10 mL  10 mL Intracatheter PRN Marlene Simas, MD        SURGICAL HISTORY:  Past Surgical History:  Procedure Laterality Date   A-FLUTTER ABLATION N/A 06/20/2021   Procedure: A-FLUTTER ABLATION;  Surgeon: Tammie Fall, MD;  Location: [facility] INVASIVE CV LAB;  Service: Cardiovascular;  Laterality: N/A;   BIV UPGRADE N/A 02/08/2019   Procedure: UPGRADE TO BIV PPM;  Surgeon: Jolly Needle, MD;  Location: [facility] INVAS[facility] CV LAB;  Service: Cardiovascular;  Laterality: N/A;   BRONCHIAL BIOPSY  12/02/2022   Procedure: BRONCHIAL BIOPSIES;  Surgeon: Denson Flake, MD;  Location: [facility] ENDOSCOPY;  Service: Pulmonary;;   BRONCHIAL BRUSHINGS  12/02/2022   Procedure: BRONCHIAL BRUSHINGS;  Surgeon:  Denson Flake, MD;  Location: [facility] ENDOSCOPY;  Service: Pulmonary;;   BRONCHIAL NEEDLE ASPIRATION BIOPSY  12/02/2022   Procedure: BRONCHIAL NEEDLE ASPIRATION BIOPSIES;  Surgeon: Denson Flake, MD;  Location: [facility] ENDOSCOPY;  Service: Pulmonary;;   BRONCHIAL WASHINGS  12/02/2022   Procedure: BRONCHIAL WASHINGS;  Surgeon: Denson Flake, MD;  Location: [facility] ENDOSCOPY;  Service: Pulmonary;;   CARDIAC CATHETERIZATION N/A 11/02/2015   Procedure: Left Heart Cath and Coronary Angiography;  Surgeon: Arnoldo Lapping, MD;  Location: [facility] INVASIVE CV LAB;  Service: Cardiovascular;  Laterality: N/A;   CARDIAC CATHETERIZATION N/A 11/21/2015   Procedure: Coronary Stent Intervention;  Surgeon: Arnoldo Lapping, MD;  Location: [facility] INVASIVE CV LAB;  Service: Cardiovascular;  Laterality: N/A;   CARDIAC CATHETERIZATION  11/2017  CARDIOVERSION N/A 03/26/2018   Procedure: CARDIOVERSION;  Surgeon: Euell Herrlich, MD;  Location: [facility] ENDOSCOPY;  Service: Cardiovascular;  Laterality: N/A;   COLONOSCOPY  01/16/2015   per Dr. Howard Macho, benign polyps, repeat in 10 yrs    CORONARY ARTERY BYPASS GRAFT N/A 01/27/2018   Procedure: CORONARY ARTERY BYPASS GRAFTING (CABG) x two, using left internal mammary artery and right leg greater saphenous vein harvested endoscopically;  Surgeon: Gardenia Jump, MD;  Location: [facility] OR;  Service: Open Heart Surgery;  Laterality: N/A;   DIRECT LARYNGOSCOPY Bilateral 04/20/2023   Procedure: LARYNGOSCOPY, MICRO DIRECT WITH PROLARYN INJECTION;  Surgeon: Virgina Grills, MD;  Location: [facility] OR;  Service: ENT;  Laterality: Bilateral;   EP IMPLANTABLE DEVICE N/A 11/02/2015   MDT Adivisa MRI conditional dual chamber pacemaker implanted by Dr Nunzio Belch for complete heart block   FEMUR IM NAIL Left 07/01/2022   Procedure: INTRAMEDULLARY (IM) NAIL FEMORAL LEFT ANTEGRADE;  Surgeon: Hardy Lia, MD;  Location: [facility] OR;  Service: Orthopedics;  Laterality: Left;   IR IMAGING GUIDED PORT INSERTION  01/02/2023   KNEE  SURGERY Right 1972   LEAD EXTRACTION N/A 10/20/2022   Procedure: LEAD EXTRACTION;  Surgeon: Tammie Fall, MD;  Location: [facility] CV LAB;  Service: Cardiovascular;  Laterality: N/A;   LUMBAR LAMINECTOMY  1996   MITRAL VALVE REPLACEMENT N/A 01/27/2018   Procedure: MITRAL VALVE (MV) REPLACEMENT using Magna Mitral Ease Valve Size ;  Surgeon: Gardenia Jump, MD;  Location: [facility] OR;  Service: Open Heart Surgery;  Laterality: N/A;   RIGHT/LEFT HEART CATH AND CORONARY ANGIOGRAPHY N/A 12/02/2017   Procedure: RIGHT/LEFT HEART CATH AND CORONARY ANGIOGRAPHY;  Surgeon: Lucendia Rusk, MD;  Location: [facility] INVASIVE CV LAB;  Service: Cardiovascular;  Laterality: N/A;   TEE WITHOUT CARDIOVERSION N/A 12/09/2017   Procedure: TRANSESOPHAGEAL ECHOCARDIOGRAM (TEE);  Surgeon: Sheryle Donning, MD;  Location: [facility] ENDOSCOPY;  Service: Cardiovascular;  Laterality: N/A;   TEE WITHOUT CARDIOVERSION N/A 01/27/2018   Procedure: TRANSESOPHAGEAL ECHOCARDIOGRAM (TEE);  Surgeon: Gardenia Jump, MD;  Location: [facility] OR;  Service: Open Heart Surgery;  Laterality: N/A;   VIDEO BRONCHOSCOPY WITH RADIAL ENDOBRONCHIAL ULTRASOUND  12/02/2022   Procedure: VIDEO BRONCHOSCOPY WITH RADIAL ENDOBRONCHIAL ULTRASOUND;  Surgeon: Denson Flake, MD;  Location: [facility] ENDOSCOPY;  Service: Pulmonary;;    REVIEW OF SYSTEMS:   Constitutional: Positive for weight loss. Negative for appetite change, chills, fatigue, fever. HENT: Negative for mouth sores, nosebleeds, sore throat and trouble swallowing.   Eyes: Negative for eye problems and icterus.  Respiratory: Negative for cough, hemoptysis, shortness of breath and wheezing.   Cardiovascular: Negative for chest pain and leg swelling.  Gastrointestinal: Positive for diarrhea. Negative for abdominal pain, constipation, nausea and vomiting.  Genitourinary: Negative for bladder incontinence, difficulty urinating, dysuria, frequency and hematuria.   Musculoskeletal: Negative for back  pain, gait problem, neck pain and neck stiffness.  Skin: Positive for itching without rash.  Neurological: Negative for dizziness, extremity weakness, gait problem, headaches, light-headedness and seizures.  Hematological: Negative for adenopathy. Does not bruise/bleed easily.  Psychiatric/Behavioral: Negative for confusion, depression and sleep disturbance. The patient is not nervous/anxious     PHYSICAL EXAMINATION:  Blood pressure (!) 100/54, pulse 76, temperature (!) 97.3 F (36.3 C), temperature source Temporal, resp. rate 16, weight 170 lb 6.4 oz (77.3 kg), SpO2 100%.  ECOG PERFORMANCE STATUS: 1  Physical Exam  Constitutional: Oriented to person, place, and time and thin appearing male, and in no distress.  HENT:  Head: Normocephalic and  atraumatic.  Mouth/Throat: Oropharynx is clear and moist. No oropharyngeal exudate.  Eyes: Conjunctivae are normal. Right eye exhibits no discharge. Left eye exhibits no discharge. No scleral icterus.  Neck: Normal range of motion. Neck supple.  Cardiovascular: Normal rate, regular rhythm, normal heart sounds and intact distal pulses.   Pulmonary/Chest: Effort normal and breath sounds normal. No respiratory distress. No wheezes. No rales.  Abdominal: Soft. Bowel sounds are normal. Exhibits no distension and no mass. There is no tenderness.  Musculoskeletal: Normal range of motion. Exhibits no edema.  Lymphadenopathy:    No cervical adenopathy.  Neurological: Alert and oriented to person, place, and time. Exhibits normal muscle tone. Gait normal. Coordination normal.  Skin: Skin is warm and dry. No rash noted. Not diaphoretic. No erythema. No pallor.  Psychiatric: Mood, memory and judgment normal.  Vitals reviewed.  LABORATORY DATA: Lab Results  Component Value Date   WBC 10.2 06/16/2023   HGB 12.4 (L) 06/16/2023   HCT 35.9 (L) 06/16/2023   MCV 94.7 06/16/2023   PLT 264 06/16/2023      Chemistry      Component Value Date/Time   NA  134 (L) 06/16/2023 1015   NA 137 06/06/2021 0919   K 4.3 06/16/2023 1015   CL 103 06/16/2023 1015   CO2 25 06/16/2023 1015   BUN 15 06/16/2023 1015   BUN 19 06/06/2021 0919   CREATININE 1.19 06/16/2023 1015   CREATININE 1.04 01/01/2016 0801      Component Value Date/Time   CALCIUM  8.9 06/16/2023 1015   ALKPHOS 73 06/16/2023 1015   AST 21 06/16/2023 1015   ALT 18 06/16/2023 1015   BILITOT 1.5 (H) 06/16/2023 1015       RADIOGRAPHIC STUDIES:  No results found.   ASSESSMENT/PLAN:  This is a very pleasant 68 year old never smoker Caucasian male with stage IV lung cancer, poorly differentiated carcinoma, felt to be non-small cell lung cancer. He was diagnosed in October 2024.  He presented with metabolic central left upper lobe lung mass invading the mediastinum, small hypermetabolic right hilar lymph nodes, bilateral adrenal gland masses and splenic lesion consistent metastatic disease, hypermetabolic focus in the right gluteus maximus muscle.  His molecular studies show he is MSI high   He is currently undergoing palliative immunotherapy with Keytruda  200 mg IV every 3 weeks. He is status post 8 cycles.   The patient was seen with Dr. Marguerita Shih today.  Dr. Marguerita Shih explained that in the event that his diarrhea which is controlled with Imodium is from immunotherapy, this would be considered grade 1 which we will continue on the current course with close monitoring.  Dr. Marguerita Shih explained his diarrhea could be from other sources as well.  The patient would feel more comfortable with referral to gastroenterology to evaluate his diarrhea.  Dr. Marguerita Shih encouraged the patient to continue taking Imodium.  However the patient develops any new or worsening symptoms such as increased diarrhea and not responsive to Imodium, fevers, abdominal pain, or blood in the stool, etc. he would need to be reevaluated.  We are also arranging for routine restaging CT of the chest, abdomen, pelvis which will include  the colon.  If there was concern for colitis from his Keytruda  then we recommend high-dose steroids.  Labs were reviewed. Recommend that he proceed with cycle #9 today as scheduled.    We will see him back in 3 weeks for evaluation and repeat blood work before undergoing cycle #10.   Patient also has a history  of lymphoma and he did have radiation to the chest for Hodgkin's lymphoma in 2002 and chemotherapy.  He was previously followed by Dr. Maria Shiner.  He was instructed to hydrate well at home.  The patient was advised to call immediately if he has any concerning symptoms in the interval. The patient voices understanding of current disease status and treatment options and is in agreement with the current care plan. All questions were answered. The patient knows to call the clinic with any problems, questions or concerns. We can certainly see the patient much sooner if necessary  No orders of the defined types were placed in this encounter.    Johanna Matto L Rual Vermeer, PA-C 06/16/23  ADDENDUM: Hematology/Oncology Attending: I had a face-to-face encounter with the patient today.  I reviewed his record, lab and recommended his care plan.  This is a very pleasant 68 years old white male with a stage IV non-small cell lung cancer poorly differentiated diagnosed in October 2024 with no actionable mutations but MSI high as well as PD-L1 expression of 98%.  The patient started systemic immunotherapy with single agent Keytruda  200 Mg IV every 3 weeks on December 29, 2022 status post 8 cycles.  He has been tolerating his treatment well except for 1 episodes of diarrhea usually in the morning after his breakfast.  He has tried Imodium with no significant change but he has no more than 1 diarrhea a day.  It is bothering him but he is still able to handle the situation well but still an annoying side effects.  It could be related to his treatment with Keytruda  but the patient also has other  gastrointestinal abnormalities in the past.  I had a lengthy discussion with the patient today about his condition and management.  We recommended for him to see gastroenterology for further evaluation and to rule out any other etiology of his diarrhea.  I also discussed with the patient holding his treatment with Keytruda  but he is not interested in this option and he would like to continue his treatment as planned. He will come back for follow-up visit in 3 weeks for evaluation with repeat CT scan of the chest, abdomen and pelvis for restaging of his disease. He was advised to call immediately if he has any other concerning symptoms in the interval.  The total time spent in the appointment was 20 minutes. Disclaimer: This note was dictated with voice recognition software. Similar sounding words can inadvertently be transcribed and may be missed upon review. Aurelio Blower, MD

## 2023-06-16 ENCOUNTER — Inpatient Hospital Stay: Attending: Physician Assistant

## 2023-06-16 ENCOUNTER — Inpatient Hospital Stay (HOSPITAL_BASED_OUTPATIENT_CLINIC_OR_DEPARTMENT_OTHER): Admitting: Physician Assistant

## 2023-06-16 ENCOUNTER — Inpatient Hospital Stay

## 2023-06-16 VITALS — BP 113/59

## 2023-06-16 VITALS — BP 100/54 | HR 76 | Temp 97.3°F | Resp 16 | Wt 170.4 lb

## 2023-06-16 DIAGNOSIS — C7971 Secondary malignant neoplasm of right adrenal gland: Secondary | ICD-10-CM | POA: Diagnosis not present

## 2023-06-16 DIAGNOSIS — C771 Secondary and unspecified malignant neoplasm of intrathoracic lymph nodes: Secondary | ICD-10-CM | POA: Insufficient documentation

## 2023-06-16 DIAGNOSIS — I4891 Unspecified atrial fibrillation: Secondary | ICD-10-CM | POA: Diagnosis not present

## 2023-06-16 DIAGNOSIS — Z7982 Long term (current) use of aspirin: Secondary | ICD-10-CM | POA: Insufficient documentation

## 2023-06-16 DIAGNOSIS — I5042 Chronic combined systolic (congestive) and diastolic (congestive) heart failure: Secondary | ICD-10-CM | POA: Insufficient documentation

## 2023-06-16 DIAGNOSIS — Z95828 Presence of other vascular implants and grafts: Secondary | ICD-10-CM

## 2023-06-16 DIAGNOSIS — Z79899 Other long term (current) drug therapy: Secondary | ICD-10-CM | POA: Diagnosis not present

## 2023-06-16 DIAGNOSIS — Z5112 Encounter for antineoplastic immunotherapy: Secondary | ICD-10-CM | POA: Insufficient documentation

## 2023-06-16 DIAGNOSIS — C3492 Malignant neoplasm of unspecified part of left bronchus or lung: Secondary | ICD-10-CM

## 2023-06-16 DIAGNOSIS — C3412 Malignant neoplasm of upper lobe, left bronchus or lung: Secondary | ICD-10-CM | POA: Insufficient documentation

## 2023-06-16 DIAGNOSIS — I110 Hypertensive heart disease with heart failure: Secondary | ICD-10-CM | POA: Insufficient documentation

## 2023-06-16 LAB — CBC WITH DIFFERENTIAL (CANCER CENTER ONLY)
Abs Immature Granulocytes: 0.03 10*3/uL (ref 0.00–0.07)
Basophils Absolute: 0 10*3/uL (ref 0.0–0.1)
Basophils Relative: 0 %
Eosinophils Absolute: 0.1 10*3/uL (ref 0.0–0.5)
Eosinophils Relative: 1 %
HCT: 35.9 % — ABNORMAL LOW (ref 39.0–52.0)
Hemoglobin: 12.4 g/dL — ABNORMAL LOW (ref 13.0–17.0)
Immature Granulocytes: 0 %
Lymphocytes Relative: 8 %
Lymphs Abs: 0.8 10*3/uL (ref 0.7–4.0)
MCH: 32.7 pg (ref 26.0–34.0)
MCHC: 34.5 g/dL (ref 30.0–36.0)
MCV: 94.7 fL (ref 80.0–100.0)
Monocytes Absolute: 0.5 10*3/uL (ref 0.1–1.0)
Monocytes Relative: 5 %
Neutro Abs: 8.8 10*3/uL — ABNORMAL HIGH (ref 1.7–7.7)
Neutrophils Relative %: 86 %
Platelet Count: 264 10*3/uL (ref 150–400)
RBC: 3.79 MIL/uL — ABNORMAL LOW (ref 4.22–5.81)
RDW: 13.1 % (ref 11.5–15.5)
WBC Count: 10.2 10*3/uL (ref 4.0–10.5)
nRBC: 0 % (ref 0.0–0.2)

## 2023-06-16 LAB — CMP (CANCER CENTER ONLY)
ALT: 18 U/L (ref 0–44)
AST: 21 U/L (ref 15–41)
Albumin: 4.5 g/dL (ref 3.5–5.0)
Alkaline Phosphatase: 73 U/L (ref 38–126)
Anion gap: 6 (ref 5–15)
BUN: 15 mg/dL (ref 8–23)
CO2: 25 mmol/L (ref 22–32)
Calcium: 8.9 mg/dL (ref 8.9–10.3)
Chloride: 103 mmol/L (ref 98–111)
Creatinine: 1.19 mg/dL (ref 0.61–1.24)
GFR, Estimated: >60 mL/min (ref >60)
Glucose, Bld: 102 mg/dL — ABNORMAL HIGH (ref 70–99)
Potassium: 4.3 mmol/L (ref 3.5–5.1)
Sodium: 134 mmol/L — ABNORMAL LOW (ref 135–145)
Total Bilirubin: 1.5 mg/dL — ABNORMAL HIGH (ref 0.0–1.2)
Total Protein: 6.7 g/dL (ref 6.5–8.1)

## 2023-06-16 LAB — TSH: TSH: 6.45 u[IU]/mL — ABNORMAL HIGH (ref 0.350–4.500)

## 2023-06-16 MED ORDER — SODIUM CHLORIDE 0.9 % IV SOLN
200.0000 mg | Freq: Once | INTRAVENOUS | Status: AC
  Administered 2023-06-16: 200 mg via INTRAVENOUS
  Filled 2023-06-16: qty 200

## 2023-06-16 MED ORDER — SODIUM CHLORIDE 0.9% FLUSH
10.0000 mL | INTRAVENOUS | Status: DC | PRN
  Administered 2023-06-16: 10 mL

## 2023-06-16 MED ORDER — SODIUM CHLORIDE 0.9% FLUSH
10.0000 mL | Freq: Once | INTRAVENOUS | Status: AC
  Administered 2023-06-16: 10 mL

## 2023-06-16 MED ORDER — HEPARIN SOD (PORK) LOCK FLUSH 100 UNIT/ML IV SOLN
500.0000 [IU] | Freq: Once | INTRAVENOUS | Status: AC | PRN
  Administered 2023-06-16: 500 [IU]

## 2023-06-16 MED ORDER — SODIUM CHLORIDE 0.9 % IV SOLN: INTRAVENOUS | Status: DC

## 2023-06-16 NOTE — Patient Instructions (Signed)

## 2023-06-17 LAB — T4: T4, Total: 6.4 ug/dL (ref 4.5–12.0)

## 2023-06-22 ENCOUNTER — Telehealth: Payer: Self-pay

## 2023-06-22 NOTE — Telephone Encounter (Signed)
 Routed [facility] lab work to Dr. Alyne Babinski through epic per Dr. Marguerita Shih.

## 2023-06-25 ENCOUNTER — Ambulatory Visit (HOSPITAL_COMMUNITY)
Admission: RE | Admit: 2023-06-25 | Discharge: 2023-06-25 | Disposition: A | Discharge status: Home / Self Care | Source: Ambulatory Visit | Attending: Physician Assistant | Admitting: Physician Assistant

## 2023-06-25 DIAGNOSIS — I517 Cardiomegaly: Secondary | ICD-10-CM | POA: Diagnosis not present

## 2023-06-25 DIAGNOSIS — C3492 Malignant neoplasm of unspecified part of left bronchus or lung: Secondary | ICD-10-CM | POA: Insufficient documentation

## 2023-06-25 DIAGNOSIS — Z4682 Encounter for fitting and adjustment of non-vascular catheter: Secondary | ICD-10-CM | POA: Diagnosis not present

## 2023-06-25 DIAGNOSIS — Z950 Presence of cardiac pacemaker: Secondary | ICD-10-CM | POA: Diagnosis not present

## 2023-06-25 MED ORDER — IOHEXOL 9 MG/ML PO SOLN
ORAL | Status: AC
  Filled 2023-06-25: qty 1000

## 2023-06-25 MED ORDER — IOHEXOL 300 MG/ML  SOLN
100.0000 mL | Freq: Once | INTRAMUSCULAR | Status: AC | PRN
  Administered 2023-06-25: 100 mL via INTRAVENOUS

## 2023-06-25 MED ORDER — IOHEXOL 9 MG/ML PO SOLN
1000.0000 mL | ORAL | Status: AC
  Administered 2023-06-25: 1000 mL via ORAL

## 2023-06-25 MED ORDER — HEPARIN SOD (PORK) LOCK FLUSH 100 UNIT/ML IV SOLN
100.0000 [IU] | Freq: Once | INTRAVENOUS | Status: AC
  Administered 2023-06-25: 100 [IU] via INTRAVENOUS

## 2023-07-01 ENCOUNTER — Other Ambulatory Visit (HOSPITAL_COMMUNITY): Payer: Self-pay

## 2023-07-02 ENCOUNTER — Encounter: Payer: Self-pay | Admitting: Gastroenterology

## 2023-07-05 NOTE — Progress Notes (Unsigned)
 [facility] OFFICE PROGRESS NOTE  Donley Furth, MD [address] 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 He was diagnosed in October 2024.  He presented with metabolic central left upper lobe lung mass invading the mediastinum, small hypermetabolic right hilar lymph nodes, bilateral adrenal gland masses and splenic lesion consistent metastatic disease, hypermetabolic focus in the right gluteus maximus muscle.    Molecular studies by [employer] show MSI high.    Foundation One requested on 12/22/22   PDL1: 98%  PRIOR THERAPY: None  CURRENT THERAPY:  Immunotherapy with Keytruda  200 mg IV every 3 weeks.  First dose expected on 12/29/22. Status post 9 cycles.   INTERVAL HISTORY: Tyler Hendrix 68 y.o. male returns to the clinic today for a follow-up visit.  The patient was last seen in the clinic by myself and Dr. Marguerita Shih 3 weeks ago. The patient has been endorsing diarrhea. His most recent CT fortunately did not show any inflammatory changes in the bowels.    He was able to identify what caused his diarrhea and associated abdominal cramping. It was secondary to  supplements he was taking. He was taking 4 supplements but was not able to figure out which one it was. However, he is going to stop taking all of them. He tried to figure out which supplement was causing it yesterday which exacerbated diarrhea. Otherwise he only had one episode of diarrhea about 9 days ago. He denies blood in the stool or nausea. He denies vomiting.   Since his diarrhea had improved he is no longer losing a lot of weight. His appetite is fine. No fevers, chills, night sweats, changes in breathing, cough, chest pain, or headaches.    The patient is followed by the clinic due to his metastatic cancer felt to be poorly differentiated non-small cell lung cancer.  He is found to MSI high, therefore, he started on single agent immunotherapy  with Keytruda . He recently had a restaging CT scan performed. He is here today for evaluation and to review his scan  before undergoing cycle #10.  MEDICAL HISTORY: Past Medical History:  Diagnosis Date   Abnormal TSH    Arthritis    ddd   Atrial fibrillation and flutter (HCC)    s/p DCCV 03/26/2018   CAD in native artery    a. 11/2015: s/p 2 vessel PCI of the first diagonal branch of the LAD in the mid left circumflex with DES. // s/p CABG 01/2018   Chronic combined systolic and diastolic CHF (congestive heart failure) (HCC)    sees Dr. Nunzio Belch    Complete heart block (HCC)    a. s/p Medtronic ppm 10/2015.   Dyspnea    with exertion   Family history of bladder cancer    Family history of prostate cancer    Family history of stomach cancer    History of radiation therapy 2002   Hodgkin disease (HCC) 2002   a. s/p chest radiation and chemotherapy   Hypertension    Hypothyroidism    sees Dr. Aldona Amel    Incidental pulmonary nodule 01/19/2018   Left apical opacity - possible scarring   Inguinal hernia 10/14/2022   bilateral   Mitral regurgitation    s/p bioprosthetic MV replacement 01/2018   Non-small cell lung cancer (NSCLC) (HCC) 11/2022   Pacemaker 10/2015   Medtronic   PVC's (premature ventricular contractions)    Right bundle branch block  S/P CABG x 2 01/27/2018   LIMA to LAD, SVG to RCA, EVH via right thigh   S/P mitral valve replacement with bioprosthetic valve 01/27/2018   29 mm [facility] Mitral stented bovine pericardial tissue valve    ALLERGIES:  is allergic to other.  MEDICATIONS:  Current Outpatient Medications  Medication Sig Dispense Refill   aspirin  EC 81 MG tablet Take 81 mg by mouth daily.     atorvastatin  (LIPITOR ) 40 MG tablet Take 1 tablet (40 mg total) by mouth daily. 90 tablet 3   Cholecalciferol  125 MCG (5000 UT) TABS Take 1 tablet by mouth daily. 30 tablet 6   levothyroxine  (SYNTHROID ) 75 MCG tablet Take 1 tablet (75 mcg total) by mouth  daily before breakfast. 90 tablet 3   methylPREDNISolone  (MEDROL  DOSEPAK) 4 MG TBPK tablet Use as directed on package instructions 21 tablet 0   sacubitril -valsartan  (ENTRESTO ) 24-26 MG Take 1 tablet by mouth 2 (two) times daily. 180 tablet 3   spironolactone  (ALDACTONE ) 25 MG tablet Take one-half tablet (12.5 mg total) by mouth daily. 45 tablet 3   tamsulosin  (FLOMAX ) 0.4 MG CAPS capsule Take 1 capsule (0.4 mg total) by mouth daily. 90 capsule 3   traZODone  (DESYREL ) 50 MG tablet Take 1 tablet (50 mg total) by mouth at bedtime as needed for sleep. 90 tablet 1   No current facility-administered medications for this visit.    SURGICAL HISTORY:  Past Surgical History:  Procedure Laterality Date   A-FLUTTER ABLATION N/A 06/20/2021   Procedure: A-FLUTTER ABLATION;  Surgeon: Tammie Fall, MD;  Location: [facility] INVASIVE CV LAB;  Service: Cardiovascular;  Laterality: N/A;   BIV UPGRADE N/A 02/08/2019   Procedure: UPGRADE TO BIV PPM;  Surgeon: Jolly Needle, MD;  Location: [facility] INVASIVE CV LAB;  Service: Cardiovascular;  Laterality: N/A;   BRONCHIAL BIOPSY  12/02/2022   Procedure: BRONCHIAL BIOPSIES;  Surgeon: Denson Flake, MD;  Location: [facility] ENDOSCOPY;  Service: Pulmonary;;   BRONCHIAL BRUSHINGS  12/02/2022   Procedure: BRONCHIAL BRUSHINGS;  Surgeon: Denson Flake, MD;  Location: [facility] ENDOSCOPY;  Service: Pulmonary;;   BRONCHIAL NEEDLE ASPIRATION BIOPSY  12/02/2022   Procedure: BRONCHIAL NEEDLE ASPIRATION BIOPSIES;  Surgeon: Denson Flake, MD;  Location: [facility] ENDOSCOPY;  Service: Pulmonary;;   BRONCHIAL WASHINGS  12/02/2022   Procedure: BRONCHIAL WASHINGS;  Surgeon: Denson Flake, MD;  Location: [facility] ENDOSCOPY;  Service: Pulmonary;;   CARDIAC CATHETERIZATION N/A 11/02/2015   Procedure: Left Heart Cath and Coronary Angiography;  Surgeon: Arnoldo Lapping, MD;  Location: [facility] INVASIVE CV LAB;  Service: Cardiovascular;  Laterality: N/A;   CARDIAC CATHETERIZATION N/A 11/21/2015   Procedure: Coronary Stent  Intervention;  Surgeon: Arnoldo Lapping, MD;  Location: [facility] INVASIVE CV LAB;  Service: Cardiovascular;  Laterality: N/A;   CARDIAC CATHETERIZATION  11/2017   CARDIOVERSION N/A 03/26/2018   Procedure: CARDIOVERSION;  Surgeon: Euell Herrlich, MD;  Location: [facility] ENDOSCOPY;  Service: Cardiovascular;  Laterality: N/A;   COLONOSCOPY  01/16/2015   per Dr. Howard Macho, benign polyps, repeat in 10 yrs    CORONARY ARTERY BYPASS GRAFT N/A 01/27/2018   Procedure: CORONARY ARTERY BYPASS GRAFTING (CABG) x two, using left internal mammary artery and right leg greater saphenous vein harvested endoscopically;  Surgeon: Gardenia Jump, MD;  Location: [facility] OR;  Service: Open Heart Surgery;  Laterality: N/A;   DIRECT LARYNGOSCOPY Bilateral 04/20/2023   Procedure: LARYNGOSCOPY, MICRO DIRECT WITH PROLARYN INJECTION;  Surgeon: Virgina Grills, MD;  Location: [facility] OR;  Service: ENT;  Laterality:  Bilateral;   EP IMPLANTABLE DEVICE N/A 11/02/2015   MDT Adivisa MRI conditional dual chamber pacemaker implanted by Dr Nunzio Belch for complete heart block   FEMUR IM NAIL Left 07/01/2022   Procedure: INTRAMEDULLARY (IM) NAIL FEMORAL LEFT ANTEGRADE;  Surgeon: Hardy Lia, MD;  Location: [facility] OR;  Service: Orthopedics;  Laterality: Left;   IR IMAGING GUIDED PORT INSERTION  01/02/2023   KNEE SURGERY Right 1972   LEAD EXTRACTION N/A 10/20/2022   Procedure: LEAD EXTRACTION;  Surgeon: Tammie Fall, MD;  Location: [facility] INVASIVE CV LAB;  Service: Cardiovascular;  Laterality: N/A;   LUMBAR LAMINECTOMY  1996   MITRAL VALVE REPLACEMENT N/A 01/27/2018   Procedure: MITRAL VALVE (MV) REPLACEMENT using Magna Mitral Ease Valve Size ;  Surgeon: Gardenia Jump, MD;  Location: [facility] OR;  Service: Open Heart Surgery;  Laterality: N/A;   RIGHT/LEFT HEART CATH AND CORONARY ANGIOGRAPHY N/A 12/02/2017   Procedure: RIGHT/LEFT HEART CATH AND CORONARY ANGIOGRAPHY;  Surgeon: Lucendia Rusk, MD;  Location: [facility] INVASIVE CV LAB;  Service: Cardiovascular;   Laterality: N/A;   TEE WITHOUT CARDIOVERSION N/A 12/09/2017   Procedure: TRANSESOPHAGEAL ECHOCARDIOGRAM (TEE);  Surgeon: Sheryle Donning, MD;  Location: [facility] ENDOSCOPY;  Service: Cardiovascular;  Laterality: N/A;   TEE WITHOUT CARDIOVERSION N/A 01/27/2018   Procedure: TRANSESOPHAGEAL ECHOCARDIOGRAM (TEE);  Surgeon: Gardenia Jump, MD;  Location: [facility] OR;  Service: Open Heart Surgery;  Laterality: N/A;   VIDEO BRONCHOSCOPY WITH RADIAL ENDOBRONCHIAL ULTRASOUND  12/02/2022   Procedure: VIDEO BRONCHOSCOPY WITH RADIAL ENDOBRONCHIAL ULTRASOUND;  Surgeon: Denson Flake, MD;  Location: [facility] ENDOSCOPY;  Service: Pulmonary;;    REVIEW OF SYSTEMS:   Review of Systems  Constitutional: Negative for chills and fever.  HENT: Negative for mouth sores, nosebleeds, sore throat and trouble swallowing.   Eyes: Negative for eye problems and icterus.  Respiratory: Negative for cough, hemoptysis, shortness of breath and wheezing.   Cardiovascular: Negative for chest pain and leg swelling.  Gastrointestinal: Negative for abdominal pain, constipation, diarrhea (most recent yesterday), nausea and vomiting.  Genitourinary: Negative for bladder incontinence, difficulty urinating, dysuria, frequency and hematuria.   Musculoskeletal: Negative for back pain, gait problem, neck pain and neck stiffness.  Skin: Negative for itching and rash.  Neurological: Negative for dizziness, extremity weakness, gait problem, headaches, light-headedness and seizures.  Hematological: Negative for adenopathy. Does not bruise/bleed easily.  Psychiatric/Behavioral: Negative for confusion, depression and sleep disturbance. The patient is not nervous/anxious.     PHYSICAL EXAMINATION:  There were no vitals taken for this visit.  ECOG PERFORMANCE STATUS: 1  Physical Exam  Constitutional: Oriented to person, place, and time and well-developed, well-nourished, and in no distress.  HENT:  Head: Normocephalic and atraumatic.   Mouth/Throat: Oropharynx is clear and moist. No oropharyngeal exudate.  Eyes: Conjunctivae are normal. Right eye exhibits no discharge. Left eye exhibits no discharge. No scleral icterus.  Neck: Normal range of motion. Neck supple.  Cardiovascular: Normal rate, regular rhythm, normal heart sounds and intact distal pulses.   Pulmonary/Chest: Effort normal and breath sounds normal. No respiratory distress. No wheezes. No rales.  Abdominal: Soft. Bowel sounds are normal. Exhibits no distension and no mass. There is no tenderness.  Musculoskeletal: Normal range of motion. Exhibits no edema.  Lymphadenopathy:    No cervical adenopathy.  Neurological: Alert and oriented to person, place, and time. Exhibits normal muscle tone. Gait normal. Coordination normal.  Skin: Skin is warm and dry. No rash noted. Not diaphoretic. No erythema. No pallor.  Psychiatric: Mood, memory  and judgment normal.  Vitals reviewed.  LABORATORY DATA: Lab Results  Component Value Date   WBC 10.2 06/16/2023   HGB 12.4 (L) 06/16/2023   HCT 35.9 (L) 06/16/2023   MCV 94.7 06/16/2023   PLT 264 06/16/2023      Chemistry      Component Value Date/Time   NA 134 (L) 06/16/2023 1015   NA 137 06/06/2021 0919   K 4.3 06/16/2023 1015   CL 103 06/16/2023 1015   CO2 25 06/16/2023 1015   BUN 15 06/16/2023 1015   BUN 19 06/06/2021 0919   CREATININE 1.19 06/16/2023 1015   CREATININE 1.04 01/01/2016 0801      Component Value Date/Time   CALCIUM  8.9 06/16/2023 1015   ALKPHOS 73 06/16/2023 1015   AST 21 06/16/2023 1015   ALT 18 06/16/2023 1015   BILITOT 1.5 (H) 06/16/2023 1015       RADIOGRAPHIC STUDIES:  CT CHEST ABDOMEN PELVIS W CONTRAST Result Date: 06/25/2023 EXAMINATION: CT CHEST ABDOMEN PELVIS W CONTRAST CLINICAL INDICATION: Male, 68 years old. Metastatic disease evaluation TECHNIQUE: Axial CT of the chest, abdomen, and pelvis with 100 mL Omnipaque  300 intravenous contrast. Multiplanar reformations provided.  Unless otherwise specified, incidental thyroid , adrenal, renal lesions do not require dedicated imaging follow up. Additionally, any mentioned pulmonary nodules do not require dedicated imaging follow-up based on the Fleischner guidelines unless otherwise specified. Coronary calcifications are not identified unless otherwise specified. COMPARISON: 04/27/2023 FINDINGS: CHEST: Right chest wall Mediport catheter tip terminates in the right atrium. Cardiac pacemaker noted. The visualized thyroid  is normal. The thoracic aorta is normal in caliber. Scattered atherosclerotic changes are present. The main pulmonary artery is normal in caliber. The heart is enlarged. There are coronary calcifications with post-CABG changes. There is no free fluid or suspicious lymphadenopathy. The trachea and mainstem bronchi are patent. There are a few similar scattered bilateral pulmonary micronodules. There is a stable spiculated right upper lobe paramediastinal nodule measuring 2.4 x 1.4 cm (series 2 image 23). ABDOMEN/PELVIS: The liver appears normal. Gallbladder is normal. The spleen is borderline enlarged. The pancreas is normal. There is a similar left adrenal metastasis measuring approximately 10 mm (series 2 image 67). The kidneys are normal. Abdominal aorta is normal in caliber. Scattered atherosclerotic changes are present. The urinary bladder is normal. The prostate is enlarged. Bilateral fat-containing inguinal hernias are noted. Large and small bowel loops are otherwise within normal limits. There is no adenopathy. BONES: No suspicious osseous lesions are identified. There are degenerative changes of the spine and bony pelvis. IMPRESSION: Similar spiculated left upper lobe pulmonary nodule. Similar small left adrenal metastasis. DOSE REDUCTION: All CT scans are performed using radiation dose reduction techniques, when applicable. Technical factors are evaluated and adjusted to ensure appropriate moderation of exposure.  Electronically signed by: [place] Engel MD 06/25/2023 05:05 PM EDT RP Workstation: [id number]     ASSESSMENT/PLAN:  This is a very pleasant 68 year old never smoker Caucasian male with stage IV lung cancer, poorly differentiated carcinoma, felt to be non-small cell lung cancer. He was diagnosed in October 2024.  He presented with metabolic central left upper lobe lung mass invading the mediastinum, small hypermetabolic right hilar lymph nodes, bilateral adrenal gland masses and splenic lesion consistent metastatic disease, hypermetabolic focus in the right gluteus maximus muscle.  His molecular studies show he is MSI high    He is currently undergoing palliative immunotherapy with Keytruda  200 mg IV every 3 weeks. He is status post 9 cycles.    He  had been endorsing diarrhea recently.   The patient was seen with Dr. Marguerita Shih today.  Dr. Marguerita Shih personally and independently reviewed the scan and discussed results with the patient today.  The scan showed no evidence of disease progression.  Dr. Marguerita Shih recommends he continue on the same treatment at the same dose.   Labs were reviewed. Recommend that he proceed with cycle #9 today as scheduled.    We will see him back in 3 weeks for evaluation and repeat blood work before undergoing cycle #11.  Patient also has a history of lymphoma and he did have radiation to the chest for Hodgkin's lymphoma in 2002 and chemotherapy.  He was previously followed by Dr. Maria Hendrix.   He was instructed to hydrate well at home. His creatinine is a little elevated today compared to his baseline likely secondary to diarrhea yesterday.   He will discontinue the supplements since they are causing diarrhea.   We also talked about the surveillance imaging guidelines which uses CT scan for surveillance and treatment response imaging. PET scan is indicated on an as needed basis or restaging/initial staging.   The patient was advised to call immediately if he has any  concerning symptoms in the interval. The patient voices understanding of current disease status and treatment options and is in agreement with the current care plan. All questions were answered. The patient knows to call the clinic with any problems, questions or concerns. We can certainly see the patient much sooner if necessary   No orders of the defined types were placed in this encounter.     Tyler Chacko L Aradia Estey, PA-C 07/05/23  ADDENDUM: Hematology/Oncology Attending: I had a face-to-face encounter with the patient today.  I reviewed his record, lab, scan and recommended his care plan.  This is a very pleasant 68 years old white male with a stage IV non-small cell lung cancer, poorly differentiated diagnosed in October 2024 with no actionable mutations but has MSI high and PD-L1 expression of 98%.  The patient started systemic therapy with single agent Keytruda  200 Mg IV every 3 weeks status post 9 cycles.  He has been tolerating this treatment well with no concerning adverse effect except for few episodes of diarrhea that he finally figured out that was related to certain supplements that he has been using and not related to Keytruda . Is feeling much better after discontinuing the supplements. He had repeat CT scan of the chest, abdomen and pelvis performed recently.  I personally independently reviewed the scan and discussed the result with the patient today.  His scan showed no concerning findings for disease progression. I recommended for him to continue his current treatment with immunotherapy with Keytruda  and he will proceed with cycle #10 today. I will see him back for follow-up visit in 3 weeks for evaluation before the next cycle of his treatment. The patient was advised to call immediately if he has any other concerning symptoms in the interval. The total time spent in the appointment was 30 minutes including review of chart and various tests results, discussions about plan  of care and coordination of care plan . Disclaimer: This note was dictated with voice recognition software. Similar sounding words can inadvertently be transcribed and may be missed upon review. Aurelio Blower, MD

## 2023-07-07 ENCOUNTER — Inpatient Hospital Stay (HOSPITAL_BASED_OUTPATIENT_CLINIC_OR_DEPARTMENT_OTHER): Admitting: Physician Assistant

## 2023-07-07 ENCOUNTER — Inpatient Hospital Stay

## 2023-07-07 ENCOUNTER — Ambulatory Visit: Admitting: Gastroenterology

## 2023-07-07 VITALS — BP 136/84 | HR 81 | Temp 97.4°F | Resp 14 | Wt 167.9 lb

## 2023-07-07 DIAGNOSIS — C3412 Malignant neoplasm of upper lobe, left bronchus or lung: Secondary | ICD-10-CM | POA: Diagnosis not present

## 2023-07-07 DIAGNOSIS — C3492 Malignant neoplasm of unspecified part of left bronchus or lung: Secondary | ICD-10-CM

## 2023-07-07 DIAGNOSIS — I4891 Unspecified atrial fibrillation: Secondary | ICD-10-CM | POA: Diagnosis not present

## 2023-07-07 DIAGNOSIS — C7971 Secondary malignant neoplasm of right adrenal gland: Secondary | ICD-10-CM | POA: Diagnosis not present

## 2023-07-07 DIAGNOSIS — Z79899 Other long term (current) drug therapy: Secondary | ICD-10-CM | POA: Diagnosis not present

## 2023-07-07 DIAGNOSIS — Z5112 Encounter for antineoplastic immunotherapy: Secondary | ICD-10-CM

## 2023-07-07 DIAGNOSIS — C771 Secondary and unspecified malignant neoplasm of intrathoracic lymph nodes: Secondary | ICD-10-CM | POA: Diagnosis not present

## 2023-07-07 DIAGNOSIS — I110 Hypertensive heart disease with heart failure: Secondary | ICD-10-CM | POA: Diagnosis not present

## 2023-07-07 DIAGNOSIS — Z7982 Long term (current) use of aspirin: Secondary | ICD-10-CM | POA: Diagnosis not present

## 2023-07-07 DIAGNOSIS — I5042 Chronic combined systolic (congestive) and diastolic (congestive) heart failure: Secondary | ICD-10-CM | POA: Diagnosis not present

## 2023-07-07 DIAGNOSIS — Z95828 Presence of other vascular implants and grafts: Secondary | ICD-10-CM

## 2023-07-07 LAB — CMP (CANCER CENTER ONLY)
ALT: 25 U/L (ref 0–44)
AST: 23 U/L (ref 15–41)
Albumin: 4.7 g/dL (ref 3.5–5.0)
Alkaline Phosphatase: 76 U/L (ref 38–126)
Anion gap: 7 (ref 5–15)
BUN: 26 mg/dL — ABNORMAL HIGH (ref 8–23)
CO2: 24 mmol/L (ref 22–32)
Calcium: 9.3 mg/dL (ref 8.9–10.3)
Chloride: 107 mmol/L (ref 98–111)
Creatinine: 1.35 mg/dL — ABNORMAL HIGH (ref 0.61–1.24)
GFR, Estimated: 58 mL/min — ABNORMAL LOW (ref >60)
Glucose, Bld: 100 mg/dL — ABNORMAL HIGH (ref 70–99)
Potassium: 4.3 mmol/L (ref 3.5–5.1)
Sodium: 138 mmol/L (ref 135–145)
Total Bilirubin: 1.1 mg/dL (ref 0.0–1.2)
Total Protein: 7.3 g/dL (ref 6.5–8.1)

## 2023-07-07 LAB — CBC WITH DIFFERENTIAL (CANCER CENTER ONLY)
Abs Immature Granulocytes: 0.03 10*3/uL (ref 0.00–0.07)
Basophils Absolute: 0 10*3/uL (ref 0.0–0.1)
Basophils Relative: 0 %
Eosinophils Absolute: 0.1 10*3/uL (ref 0.0–0.5)
Eosinophils Relative: 1 %
HCT: 41 % (ref 39.0–52.0)
Hemoglobin: 13.9 g/dL (ref 13.0–17.0)
Immature Granulocytes: 0 %
Lymphocytes Relative: 10 %
Lymphs Abs: 0.9 10*3/uL (ref 0.7–4.0)
MCH: 32.6 pg (ref 26.0–34.0)
MCHC: 33.9 g/dL (ref 30.0–36.0)
MCV: 96 fL (ref 80.0–100.0)
Monocytes Absolute: 0.8 10*3/uL (ref 0.1–1.0)
Monocytes Relative: 8 %
Neutro Abs: 7.9 10*3/uL — ABNORMAL HIGH (ref 1.7–7.7)
Neutrophils Relative %: 81 %
Platelet Count: 349 10*3/uL (ref 150–400)
RBC: 4.27 MIL/uL (ref 4.22–5.81)
RDW: 13.1 % (ref 11.5–15.5)
WBC Count: 9.8 10*3/uL (ref 4.0–10.5)
nRBC: 0 % (ref 0.0–0.2)

## 2023-07-07 MED ORDER — SODIUM CHLORIDE 0.9% FLUSH
10.0000 mL | Freq: Once | INTRAVENOUS | Status: AC
  Administered 2023-07-07: 10 mL

## 2023-07-07 MED ORDER — SODIUM CHLORIDE 0.9% FLUSH
10.0000 mL | INTRAVENOUS | Status: DC | PRN
  Administered 2023-07-07: 10 mL

## 2023-07-07 MED ORDER — HEPARIN SOD (PORK) LOCK FLUSH 100 UNIT/ML IV SOLN
500.0000 [IU] | Freq: Once | INTRAVENOUS | Status: AC | PRN
  Administered 2023-07-07: 500 [IU]

## 2023-07-07 MED ORDER — SODIUM CHLORIDE 0.9 % IV SOLN
200.0000 mg | Freq: Once | INTRAVENOUS | Status: AC
  Administered 2023-07-07: 200 mg via INTRAVENOUS
  Filled 2023-07-07: qty 200

## 2023-07-07 MED ORDER — SODIUM CHLORIDE 0.9 % IV SOLN: INTRAVENOUS | Status: DC

## 2023-07-07 NOTE — Patient Instructions (Signed)

## 2023-07-15 ENCOUNTER — Other Ambulatory Visit (HOSPITAL_COMMUNITY): Payer: Self-pay

## 2023-07-21 ENCOUNTER — Ambulatory Visit (INDEPENDENT_AMBULATORY_CARE_PROVIDER_SITE_OTHER): Payer: 59

## 2023-07-21 DIAGNOSIS — I442 Atrioventricular block, complete: Secondary | ICD-10-CM

## 2023-07-22 LAB — CUP PACEART REMOTE DEVICE CHECK
Battery Remaining Longevity: 108 mo
Battery Voltage: 3.02 V
Brady Statistic AP VP Percent: 62.39 %
Brady Statistic AP VS Percent: 0.04 %
Brady Statistic AS VP Percent: 36.95 %
Brady Statistic AS VS Percent: 0.62 %
Brady Statistic RA Percent Paced: 62.8 %
Brady Statistic RV Percent Paced: 99.34 %
Date Time Interrogation Session: 20250610060210
Implantable Lead Connection Status: 753985
Implantable Lead Connection Status: 753985
Implantable Lead Connection Status: 753985
Implantable Lead Implant Date: 20170922
Implantable Lead Implant Date: 20170922
Implantable Lead Implant Date: 20240909
Implantable Lead Location: 753859
Implantable Lead Location: 753860
Implantable Lead Location: 753860
Implantable Lead Model: 3830
Implantable Lead Model: 5076
Implantable Lead Model: 5076
Implantable Pulse Generator Implant Date: 20240909
Lead Channel Impedance Value: 209 Ohm
Lead Channel Impedance Value: 2774 Ohm
Lead Channel Impedance Value: 304 Ohm
Lead Channel Impedance Value: 323 Ohm
Lead Channel Impedance Value: 323 Ohm
Lead Channel Impedance Value: 399 Ohm
Lead Channel Impedance Value: 418 Ohm
Lead Channel Impedance Value: 532 Ohm
Lead Channel Impedance Value: 551 Ohm
Lead Channel Pacing Threshold Amplitude: 0.375 V
Lead Channel Pacing Threshold Amplitude: 0.75 V
Lead Channel Pacing Threshold Amplitude: 0.875 V
Lead Channel Pacing Threshold Pulse Width: 0.4 ms
Lead Channel Pacing Threshold Pulse Width: 0.4 ms
Lead Channel Pacing Threshold Pulse Width: 0.4 ms
Lead Channel Sensing Intrinsic Amplitude: 19.5 mV
Lead Channel Sensing Intrinsic Amplitude: 19.5 mV
Lead Channel Sensing Intrinsic Amplitude: 2.625 mV
Lead Channel Sensing Intrinsic Amplitude: 2.625 mV
Lead Channel Setting Pacing Amplitude: 1.25 V
Lead Channel Setting Pacing Amplitude: 1.5 V
Lead Channel Setting Pacing Amplitude: 2.5 V
Lead Channel Setting Pacing Pulse Width: 0.4 ms
Lead Channel Setting Pacing Pulse Width: 0.4 ms
Lead Channel Setting Sensing Sensitivity: 2.8 mV
Zone Setting Status: 755011
Zone Setting Status: 755011

## 2023-07-26 ENCOUNTER — Ambulatory Visit: Payer: Self-pay | Admitting: Internal Medicine

## 2023-07-27 ENCOUNTER — Inpatient Hospital Stay

## 2023-07-27 ENCOUNTER — Inpatient Hospital Stay (HOSPITAL_BASED_OUTPATIENT_CLINIC_OR_DEPARTMENT_OTHER): Admitting: Internal Medicine

## 2023-07-27 ENCOUNTER — Inpatient Hospital Stay: Attending: Physician Assistant

## 2023-07-27 VITALS — BP 115/63 | HR 81 | Temp 98.0°F | Resp 16 | Ht 71.0 in | Wt 176.6 lb

## 2023-07-27 DIAGNOSIS — D649 Anemia, unspecified: Secondary | ICD-10-CM | POA: Diagnosis not present

## 2023-07-27 DIAGNOSIS — Z5112 Encounter for antineoplastic immunotherapy: Secondary | ICD-10-CM | POA: Insufficient documentation

## 2023-07-27 DIAGNOSIS — C3412 Malignant neoplasm of upper lobe, left bronchus or lung: Secondary | ICD-10-CM | POA: Diagnosis not present

## 2023-07-27 DIAGNOSIS — C3492 Malignant neoplasm of unspecified part of left bronchus or lung: Secondary | ICD-10-CM

## 2023-07-27 DIAGNOSIS — Z95828 Presence of other vascular implants and grafts: Secondary | ICD-10-CM

## 2023-07-27 DIAGNOSIS — C7972 Secondary malignant neoplasm of left adrenal gland: Secondary | ICD-10-CM | POA: Insufficient documentation

## 2023-07-27 DIAGNOSIS — C7971 Secondary malignant neoplasm of right adrenal gland: Secondary | ICD-10-CM | POA: Diagnosis not present

## 2023-07-27 LAB — CMP (CANCER CENTER ONLY)
ALT: 28 U/L (ref 0–44)
AST: 24 U/L (ref 15–41)
Albumin: 4.1 g/dL (ref 3.5–5.0)
Alkaline Phosphatase: 69 U/L (ref 38–126)
Anion gap: 7 (ref 5–15)
BUN: 19 mg/dL (ref 8–23)
CO2: 27 mmol/L (ref 22–32)
Calcium: 8.8 mg/dL — ABNORMAL LOW (ref 8.9–10.3)
Chloride: 106 mmol/L (ref 98–111)
Creatinine: 1.09 mg/dL (ref 0.61–1.24)
GFR, Estimated: >60 mL/min (ref >60)
Glucose, Bld: 122 mg/dL — ABNORMAL HIGH (ref 70–99)
Potassium: 4.2 mmol/L (ref 3.5–5.1)
Sodium: 140 mmol/L (ref 135–145)
Total Bilirubin: 0.6 mg/dL (ref 0.0–1.2)
Total Protein: 6.4 g/dL — ABNORMAL LOW (ref 6.5–8.1)

## 2023-07-27 LAB — CBC WITH DIFFERENTIAL (CANCER CENTER ONLY)
Abs Immature Granulocytes: 0.03 10*3/uL (ref 0.00–0.07)
Basophils Absolute: 0 10*3/uL (ref 0.0–0.1)
Basophils Relative: 1 %
Eosinophils Absolute: 0.1 10*3/uL (ref 0.0–0.5)
Eosinophils Relative: 2 %
HCT: 36.3 % — ABNORMAL LOW (ref 39.0–52.0)
Hemoglobin: 12.3 g/dL — ABNORMAL LOW (ref 13.0–17.0)
Immature Granulocytes: 1 %
Lymphocytes Relative: 13 %
Lymphs Abs: 0.8 10*3/uL (ref 0.7–4.0)
MCH: 32 pg (ref 26.0–34.0)
MCHC: 33.9 g/dL (ref 30.0–36.0)
MCV: 94.5 fL (ref 80.0–100.0)
Monocytes Absolute: 0.4 10*3/uL (ref 0.1–1.0)
Monocytes Relative: 6 %
Neutro Abs: 4.9 10*3/uL (ref 1.7–7.7)
Neutrophils Relative %: 77 %
Platelet Count: 237 10*3/uL (ref 150–400)
RBC: 3.84 MIL/uL — ABNORMAL LOW (ref 4.22–5.81)
RDW: 12.8 % (ref 11.5–15.5)
WBC Count: 6.3 10*3/uL (ref 4.0–10.5)
nRBC: 0 % (ref 0.0–0.2)

## 2023-07-27 MED ORDER — SODIUM CHLORIDE 0.9 % IV SOLN
200.0000 mg | Freq: Once | INTRAVENOUS | Status: AC
  Administered 2023-07-27: 200 mg via INTRAVENOUS
  Filled 2023-07-27: qty 200

## 2023-07-27 MED ORDER — SODIUM CHLORIDE 0.9% FLUSH
10.0000 mL | Freq: Once | INTRAVENOUS | Status: AC
  Administered 2023-07-27: 10 mL

## 2023-07-27 NOTE — Patient Instructions (Signed)

## 2023-07-27 NOTE — Progress Notes (Signed)
 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 He was diagnosed in October 2024.  He presented with metabolic central left upper lobe lung mass invading the mediastinum, small hypermetabolic right hilar lymph nodes, bilateral adrenal gland masses and splenic lesion consistent metastatic disease, hypermetabolic focus in the right gluteus maximus muscle.    Molecular studies by [employer] show MSI high.  Biomarker Findings Microsatellite status - MSI-High Tumor Mutational Burden - 6 Muts/Mb HRD signature - HRDsig Negative Genomic Findings For a complete list of the genes assayed, please refer to the Appendix. PALB2 F452fs*12 KRAS G12A NF1 I670fs*21 PIK3CA P17S - subclonal, H1047R? ASXL1 G659fs*58, K643fs*1 - subclonal? MSH2 loss exons 9-16 RAC1 P29H TP53 M237I 7 Disease relevant genes with no reportable alterations: ALK, BRAF, EGFR, ERBB2, MET, RET, ROS1   PDL1 98%.   PRIOR THERAPY: None   CURRENT THERAPY: Immunotherapy with Keytruda  200 mg IV every 3 weeks.  First dose on 12/29/22.  Status post 10 cycles.  INTERVAL HISTORY: Tyler Hendrix 68 y.o. male returns to the clinic today for follow-up visit.Discussed the use of AI scribe software for clinical note transcription with the patient, who gave verbal consent to proceed.  History of Present Illness   He is a 68 year old male with stage four non-small cell lung cancer who presents for pre-evaluation before starting cycle number eleven of immunotherapy.  He was diagnosed with stage four non-small cell lung cancer in October 2024, characterized by MSI high and PD-L1 expression of 98%. He is currently undergoing treatment with Keytruda , an immunotherapy, administered every three weeks, and has completed ten cycles of treatment.  No  new symptoms. No breathing issues, cough, rash, or diarrhea. He mentions feeling better recently.  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 He has three children aged 37, fifteen, and eight, which keeps him busy. 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       MEDICAL HISTORY: Past Medical History:  Diagnosis Date   Abnormal TSH    Arthritis    ddd   Atrial fibrillation and flutter (HCC)    s/p DCCV 03/26/2018   CAD in native artery    a. 11/2015: s/p 2 vessel PCI of the first diagonal branch of the LAD in the mid left circumflex with DES. // s/p CABG 01/2018   Chronic combined systolic and diastolic CHF (congestive heart failure) (HCC)    sees Dr. Nunzio Belch    Complete heart block (HCC)    a. s/p Medtronic ppm 10/2015.   Dyspnea    with exertion   Family history of bladder cancer    Family history of prostate cancer    Family history of stomach cancer    History of radiation therapy 2002   Hodgkin disease (HCC) 2002   a. s/p chest radiation and chemotherapy   Hypertension    Hypothyroidism    sees Dr. Aldona Amel    Incidental pulmonary nodule 01/19/2018   Left apical opacity - possible scarring   Inguinal hernia 10/14/2022   bilateral   Mitral regurgitation    s/p bioprosthetic MV replacement 01/2018   Non-small cell lung cancer (NSCLC) (HCC) 11/2022   Pacemaker 10/2015   Medtronic   PVC's (premature ventricular contractions)    Right bundle  branch block    S/P CABG x 2 01/27/2018   LIMA to LAD, SVG to RCA, EVH via right thigh   S/P mitral valve replacement with bioprosthetic valve 01/27/2018   29 mm [facility] Mitral stented bovine pericardial tissue valve    ALLERGIES:  is allergic to other.  MEDICATIONS:  Current Outpatient Medications  Medication Sig Dispense Refill   aspirin  EC 81 MG tablet Take 81 mg by mouth daily.     atorvastatin  (LIPITOR ) 40 MG tablet Take 1 tablet (40 mg total) by mouth  daily. 90 tablet 3   Cholecalciferol  125 MCG (5000 UT) TABS Take 1 tablet by mouth daily. 30 tablet 6   levothyroxine  (SYNTHROID ) 75 MCG tablet Take 1 tablet (75 mcg total) by mouth daily before breakfast. 90 tablet 3   methylPREDNISolone  (MEDROL  DOSEPAK) 4 MG TBPK tablet Use as directed on package instructions 21 tablet 0   sacubitril -valsartan  (ENTRESTO ) 24-26 MG Take 1 tablet by mouth 2 (two) times daily. 180 tablet 3   spironolactone  (ALDACTONE ) 25 MG tablet Take one-half tablet (12.5 mg total) by mouth daily. 45 tablet 3   tamsulosin  (FLOMAX ) 0.4 MG CAPS capsule Take 1 capsule (0.4 mg total) by mouth daily. 90 capsule 3   traZODone  (DESYREL ) 50 MG tablet Take 1 tablet (50 mg total) by mouth at bedtime as needed for sleep. 90 tablet 1   No current facility-administered medications for this visit.    SURGICAL HISTORY:  Past Surgical History:  Procedure Laterality Date   A-FLUTTER ABLATION N/A 06/20/2021   Procedure: A-FLUTTER ABLATION;  Surgeon: Tammie Fall, MD;  Location: [facility] INVASIVE CV LAB;  Service: Cardiovascular;  Laterality: N/A;   BIV UPGRADE N/A 02/08/2019   Procedure: UPGRADE TO BIV PPM;  Surgeon: Jolly Needle, MD;  Location: [facility] INVASIVE CV LAB;  Service: Cardiovascular;  Laterality: N/A;   BRONCHIAL BIOPSY  12/02/2022   Procedure: BRONCHIAL BIOPSIES;  Surgeon: Denson Flake, MD;  Location: [facility] ENDOSCOPY;  Service: Pulmonary;;   BRONCHIAL BRUSHINGS  12/02/2022   Procedure: BRONCHIAL BRUSHINGS;  Surgeon: Denson Flake, MD;  Location: [facility] ENDOSCOPY;  Service: Pulmonary;;   BRONCHIAL NEEDLE ASPIRATION BIOPSY  12/02/2022   Procedure: BRONCHIAL NEEDLE ASPIRATION BIOPSIES;  Surgeon: Denson Flake, MD;  Location: [facility] ENDOSCOPY;  Service: Pulmonary;;   BRONCHIAL WASHINGS  12/02/2022   Procedure: BRONCHIAL WASHINGS;  Surgeon: Denson Flake, MD;  Location[facility] ENDOSCOPY;  Service: Pulmonary;;   CARDIAC CATHETERIZATION N/A 11/02/2015   Procedure: Left Heart Cath and Coronary  Angiography;  Surgeon: Arnoldo Lapping, MD;  Location: [facility] INVASIVE CV LAB;  Service: Cardiovascular;  Laterality: N/A;   CARDIAC CATHETERIZATION N/A 11/21/2015   Procedure: Coronary Stent Intervention;  Surgeon: Arnoldo Lapping, MD;  Location: [facility] INVASIVE CV LAB;  Service: Cardiovascular;  Laterality: N/A;   CARDIAC CATHETERIZATION  11/2017   CARDIOVERSION N/A 03/26/2018   Procedure: CARDIOVERSION;  Surgeon: Euell Herrlich, MD;  Location: [facility] ENDOSCOPY;  Service: Cardiovascular;  Laterality: N/A;   COLONOSCOPY  01/16/2015   per Dr. Howard Macho, benign polyps, repeat in 10 yrs    CORONARY ARTERY BYPASS GRAFT N/A 01/27/2018   Procedure: CORONARY ARTERY BYPASS GRAFTING (CABG) x two, using left internal mammary artery and right leg greater saphenous vein harvested endoscopically;  Surgeon: Gardenia Jump, MD;  Location: [facility] OR;  Service: Open Heart Surgery;  Laterality: N/A;   DIRECT LARYNGOSCOPY Bilateral 04/20/2023   Procedure: LARYNGOSCOPY, MICRO DIRECT WITH PROLARYN INJECTION;  Surgeon: Virgina Grills, MD;  Location: [facility] OR;  Service: ENT;  Laterality: Bilateral;   EP IMPLANTABLE DEVICE N/A 11/02/2015   MDT Adivisa MRI conditional dual chamber pacemaker implanted by Dr Nunzio Belch for complete heart block   FEMUR IM NAIL Left 07/01/2022   Procedure: INTRAMEDULLARY (IM) NAIL FEMORAL LEFT ANTEGRADE;  Surgeon: Hardy Lia, MD;  Location: [facility] OR;  Service: Orthopedics;  Laterality: Left;   IR IMAGING GUIDED PORT INSERTION  01/02/2023   KNEE SURGERY Right 1972   LEAD EXTRACTION N/A 10/20/2022   Procedure: LEAD EXTRACTION;  Surgeon: Tammie Fall, MD;  Location: [facility] INVASIVE CV LAB;  Service: Cardiovascular;  Laterality: N/A;   LUMBAR LAMINECTOMY  1996   MITRAL VALVE REPLACEMENT N/A 01/27/2018   Procedure: MITRAL VALVE (MV) REPLACEMENT using Magna Mitral Ease Valve Size ;  Surgeon: Gardenia Jump, MD;  Location: [facility] OR;  Service: Open Heart Surgery;  Laterality: N/A;   RIGHT/LEFT HEART CATH AND CORONARY  ANGIOGRAPHY N/A 12/02/2017   Procedure: RIGHT/LEFT HEART CATH AND CORONARY ANGIOGRAPHY;  Surgeon: Lucendia Rusk, MD;  Location: [facility] INVASIVE CV LAB;  Service: Cardiovascular;  Laterality: N/A;   TEE WITHOUT CARDIOVERSION N/A 12/09/2017   Procedure: TRANSESOPHAGEAL ECHOCARDIOGRAM (TEE);  Surgeon: Sheryle Donning, MD;  Location: [facility] ENDOSCOPY;  Service: Cardiovascular;  Laterality: N/A;   TEE WITHOUT CARDIOVERSION N/A 01/27/2018   Procedure: TRANSESOPHAGEAL ECHOCARDIOGRAM (TEE);  Surgeon: Gardenia Jump, MD;  Location: [name] OR;  Service: Open Heart Surgery;  Laterality: N/A;   VIDEO BRONCHOSCOPY WITH RADIAL ENDOBRONCHIAL ULTRASOUND  12/02/2022   Procedure: VIDEO BRONCHOSCOPY WITH RADIAL ENDOBRONCHIAL ULTRASOUND;  Surgeon: Denson Flake, MD;  Location: [facility] ENDOSCOPY;  Service: Pulmonary;;    REVIEW OF SYSTEMS:  A comprehensive review of systems was negative.   PHYSICAL EXAMINATION: General appearance: alert, cooperative, and no distress Head: Normocephalic, without obvious abnormality, atraumatic Neck: no adenopathy, no JVD, supple, symmetrical, trachea midline, and thyroid  not enlarged, symmetric, no tenderness/mass/nodules Lymph nodes: Cervical, supraclavicular, and axillary nodes normal. Resp: clear to auscultation bilaterally Back: symmetric, no curvature. ROM normal. No CVA tenderness. Cardio: regular rate and rhythm, S1, S2 normal, no murmur, click, rub or gallop GI: soft, non-tender; bowel sounds normal; no masses,  no organomegaly Extremities: extremities normal, atraumatic, no cyanosis or edema  ECOG PERFORMANCE STATUS: 1 - Symptomatic but completely ambulatory  Blood pressure 115/63, pulse 81, temperature 98 F (36.7 C), temperature source Oral, resp. rate 16, height 5' 11 (1.803 m), weight 176 lb 9.6 oz (80.1 kg), SpO2 99%.  LABORATORY DATA: Lab Results  Component Value Date   WBC 9.8 07/07/2023   HGB 13.9 07/07/2023   HCT 41.0 07/07/2023   MCV 96.0 07/07/2023    PLT 349 07/07/2023      Chemistry      Component Value Date/Time   NA 138 07/07/2023 1403   NA 137 06/06/2021 0919   K 4.3 07/07/2023 1403   CL 107 07/07/2023 1403   CO2 24 07/07/2023 1403   BUN 26 (H) 07/07/2023 1403   BUN 19 06/06/2021 0919   CREATININE 1.35 (H) 07/07/2023 1403   CREATININE 1.04 01/01/2016 0801      Component Value Date/Time   CALCIUM  9.3 07/07/2023 1403   ALKPHOS 76 07/07/2023 1403   AST 23 07/07/2023 1403   ALT 25 07/07/2023 1403   BILITOT 1.1 07/07/2023 1403       RADIOGRAPHIC STUDIES: CUP PACEART REMOTE DEVICE CHECK Result Date: 07/22/2023 PPM Scheduled remote reviewed. Normal device function.  Presenting rhythm:  AP/BiV pace 5 NSVT events, HR's 154-188, EGM's c/w  V>A, 5-21 beats in duration Next remote 91 days. LA, CVRS   ASSESSMENT AND PLAN: This is a very pleasant 68 years old white male with stage IV lung cancer, felt to be poorly differentiated NSCLC. He was diagnosed in October 2024.  He presented with metabolic central left upper lobe lung mass invading the mediastinum, small hypermetabolic right hilar lymph nodes, bilateral adrenal gland masses and splenic lesion consistent metastatic disease, hypermetabolic focus in the right gluteus maximus muscle.  His molecular studies by [facility] showed MSI high and PD-L1 expression of 98%.  The patient is currently on treatment with immunotherapy with single agent Keytruda  200 Mg IV every 3 weeks status post 10 cycles. He has been tolerating this treatment fairly well. Assessment and Plan    Stage 4 non-small cell lung cancer Stage 4 non-small cell lung cancer with MSI high and PD-L1 expression of 98%. Currently undergoing treatment with pembrolizumab  every three weeks. Post ten cycles of immunotherapy, presenting for pre-evaluation before cycle eleven. No new symptoms such as dyspnea, cough, rash, or diarrhea. Lab work shows mild anemia; chemistry results pending but previously stable. - Administer  pembrolizumab  for cycle eleven. - Schedule imaging post next treatment cycle.  Mild anemia Mild anemia with hemoglobin at 12.3, consistent with previous levels. Asymptomatic with no new concerns.   The patient voices understanding of current disease status and treatment options and is in agreement with the current care plan.  All questions were answered. The patient knows to call the clinic with any problems, questions or concerns. We can certainly see the patient much sooner if necessary.  Disclaimer: This note was dictated with voice recognition software. Similar sounding words can inadvertently be transcribed and may not be corrected upon review.

## 2023-08-11 ENCOUNTER — Other Ambulatory Visit: Payer: Self-pay

## 2023-08-11 ENCOUNTER — Other Ambulatory Visit: Payer: Self-pay | Admitting: Family Medicine

## 2023-08-11 DIAGNOSIS — E782 Mixed hyperlipidemia: Secondary | ICD-10-CM

## 2023-08-11 NOTE — Progress Notes (Signed)
 [facility] Hendrix [facility] OFFICE PROGRESS NOTE  Tyler Hendrix LABOR, MD [address] 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 He was diagnosed in October 2024.  He presented with metabolic central left upper lobe lung mass invading the mediastinum, small hypermetabolic right hilar lymph nodes, bilateral adrenal gland masses and splenic lesion consistent metastatic disease, hypermetabolic focus in the right gluteus maximus muscle.    Molecular studies by [employer] show MSI high.    Foundation One requested on 12/22/22   PDL1: 98%   PRIOR THERAPY: None  CURRENT THERAPY: Immunotherapy with Keytruda  200 mg IV every 3 weeks.  First dose expected on 12/29/22. Status post 11 cycles.   INTERVAL HISTORY: Tyler Hendrix 68 y.o. male returns to the clinic today for a follow-up visit.  The patient was last seen in the clinic by myself and Dr. Sherrod 3 weeks ago.  He is feeling well today without any concerning complaints.   She started on treatment with immunotherapy with Keytruda . He has been tolerating treatment well.    Denies any fever, chills, or night sweats. He gained his weight back from when he had diarrhea a few weeks ago. He is back to his baseline weight. Today, he states his breathing is fine. He denies unusual cough. He denies any chest pain.  He denies any hemoptysis. He denies any nausea, vomiting, or constipation. He previously was having diarrhea but was able to identify the cause and has since discontinued a supplement he had been taking. Denies any headaches. He denies rashes or itching.  He is here for evaluation and repeat blood work before undergoing cycle #12.    MEDICAL HISTORY: Past Medical History:  Diagnosis Date   Abnormal TSH    Arthritis    ddd   Atrial fibrillation and flutter (HCC)    s/p DCCV 03/26/2018   CAD in native artery    a. 11/2015: s/p 2 vessel PCI of the first diagonal branch of the  LAD in the mid left circumflex with DES. // s/p CABG 01/2018   Chronic combined systolic and diastolic CHF (congestive heart failure) (HCC)    sees Dr. Kelsie    Complete heart block (HCC)    a. s/p Medtronic ppm 10/2015.   Dyspnea    with exertion   Family history of bladder cancer    Family history of prostate cancer    Family history of stomach cancer    History of radiation therapy 2002   Hodgkin disease (HCC) 2002   a. s/p chest radiation and chemotherapy   Hypertension    Hypothyroidism    sees Dr. 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  MEDICATIONS:  Current Outpatient Medications  Medication Sig Dispense Refill   aspirin  EC 81 MG tablet Take 81 mg by mouth daily.  atorvastatin  (LIPITOR ) 40 MG tablet Take 1 tablet (40 mg total) by mouth daily. 90 tablet 3   Cholecalciferol  125 MCG (5000 UT) TABS Take 1 tablet by mouth daily. 30 tablet 6   levothyroxine  (SYNTHROID ) 75 MCG tablet Take 1 tablet (75 mcg total) by mouth daily before breakfast. 90 tablet 3   methylPREDNISolone  (MEDROL  DOSEPAK) 4 MG TBPK tablet Use as directed on package instructions 21 tablet 0   sacubitril -valsartan  (ENTRESTO ) 24-26 MG Take 1 tablet by mouth 2 (two) times daily. 180 tablet 3   spironolactone  (ALDACTONE ) 25 MG tablet Take one-half tablet (12.5 mg total) by mouth daily. 45 tablet 3   tamsulosin  (FLOMAX ) 0.4 MG CAPS capsule Take 1  capsule (0.4 mg total) by mouth daily. 90 capsule 3   traZODone  (DESYREL ) 50 MG tablet Take 1 tablet (50 mg total) by mouth at bedtime as needed for sleep. 90 tablet 1   No current facility-administered medications for this visit.    SURGICAL HISTORY:  Past Surgical History:  Procedure Laterality Date   A-FLUTTER ABLATION N/A 06/20/2021   Procedure: A-FLUTTER ABLATION;  Surgeon: Tyler Hendrix ORN, MD;  Location: [facility] INVASIVE CV LAB;  Service: Cardiovascular;  Laterality: N/A;   BIV UPGRADE N/A 02/08/2019   Procedure: UPGRADE TO BIV PPM;  Surgeon: Tyler Hendrix Agent, MD;  Location: [facility] INVASIVE CV LAB;  Service: Cardiovascular;  Laterality: N/A;   BRONCHIAL BIOPSY  12/02/2022   Procedure: BRONCHIAL BIOPSIES;  Surgeon: Shelah Tyler RAMAN, MD;  Location: [facility] ENDOSCOPY;  Service: Pulmonary;;   BRONCHIAL BRUSHINGS  12/02/2022   Procedure: BRONCHIAL BRUSHINGS;  Surgeon: Shelah Tyler RAMAN, MD;  Location: [facility] ENDOSCOPY;  Service: Pulmonary;;   BRONCHIAL NEEDLE ASPIRATION BIOPSY  12/02/2022   Procedure: BRONCHIAL NEEDLE ASPIRATION BIOPSIES;  Surgeon: Shelah Tyler RAMAN, MD;  Location[facility] ENDOSCOPY;  Service: Pulmonary;;   BRONCHIAL WASHINGS  12/02/2022   Procedure: BRONCHIAL WASHINGS;  Surgeon: Shelah Tyler RAMAN, MD;  Location: [facility] ENDOSCOPY;  Service: Pulmonary;;   CARDIAC CATHETERIZATION N/A 11/02/2015   Procedure: Left Heart Cath and Coronary Angiography;  Surgeon: Tyler Fell, MD;  Location: [facility] INVASIVE CV LAB;  Service: Cardiovascular;  Laterality: N/A;   CARDIAC CATHETERIZATION N/A 11/21/2015   Procedure: Coronary Stent Intervention;  Surgeon: Tyler Fell, MD;  Location: [facility] INVASIVE CV LAB;  Service: Cardiovascular;  Laterality: N/A;   CARDIAC CATHETERIZATION  11/2017   CARDIOVERSION N/A 03/26/2018   Procedure: CARDIOVERSION;  Surgeon: Tyler Tyler LABOR, MD;  Location: [facility] ENDOSCOPY;  Service: Cardiovascular;  Laterality: N/A;   COLONOSCOPY  01/16/2015   per Dr. Teressa, benign polyps, repeat in 10 yrs     CORONARY ARTERY BYPASS GRAFT N/A 01/27/2018   Procedure: CORONARY ARTERY BYPASS GRAFTING (CABG) x two, using left internal mammary artery and right leg greater saphenous vein harvested endoscopically;  Surgeon: Dusty Sudie DEL, MD;  Location: [facility] OR;  Service: Open Heart Surgery;  Laterality: N/A;   DIRECT LARYNGOSCOPY Bilateral 04/20/2023   Procedure: LARYNGOSCOPY, MICRO DIRECT WITH PROLARYN INJECTION;  Surgeon: Carlie Clark, MD;  Location: [facility] OR;  Service: ENT;  Laterality: Bilateral;   EP IMPLANTABLE DEVICE N/A 11/02/2015   MDT Adivisa MRI conditional dual chamber pacemaker implanted by Dr Tyler Hendrix for complete heart block   FEMUR IM NAIL Left 07/01/2022   Procedure: INTRAMEDULLARY (IM) NAIL FEMORAL LEFT ANTEGRADE;  Surgeon: Celena Ozell, MD;  Location: [facility] OR;  Service: Orthopedics;  Laterality: Left;   IR IMAGING GUIDED PORT INSERTION  01/02/2023   KNEE SURGERY Right 1972   LEAD EXTRACTION N/A  10/20/2022   Procedure: LEAD EXTRACTION;  Surgeon: Tyler Hendrix ORN, MD;  Location: [facility] INVASIVE CV LAB;  Service: Cardiovascular;  Laterality: N/A;   LUMBAR LAMINECTOMY  1996   MITRAL VALVE REPLACEMENT N/A 01/27/2018   Procedure: MITRAL VALVE (MV) REPLACEMENT using Magna Mitral Ease Valve Size ;  Surgeon: Dusty Sudie DEL, MD;  Location: [facility] OR;  Service: Open Heart Surgery;  Laterality: N/A;   RIGHT/LEFT HEART CATH AND CORONARY ANGIOGRAPHY N/A 12/02/2017   Procedure: RIGHT/LEFT HEART CATH AND CORONARY ANGIOGRAPHY;  Surgeon: Dann Candyce RAMAN, MD;  Location: [facility] INVASIVE CV LAB;  Service: Cardiovascular;  Laterality: N/A;   TEE WITHOUT CARDIOVERSION N/A 12/09/2017   Procedure: TRANSESOPHAGEAL ECHOCARDIOGRAM (TEE);  Surgeon: Lonni Slain, MD;  Location: [facility] ENDOSCOPY;  Service: Cardiovascular;  Laterality: N/A;   TEE WITHOUT CARDIOVERSION N/A 01/27/2018   Procedure: TRANSESOPHAGEAL ECHOCARDIOGRAM (TEE);  Surgeon: Dusty Sudie DEL, MD;  Location: [facility] OR;  Service: Open Heart Surgery;  Laterality:  N/A;   VIDEO BRONCHOSCOPY WITH RADIAL ENDOBRONCHIAL ULTRASOUND  12/02/2022   Procedure: VIDEO BRONCHOSCOPY WITH RADIAL ENDOBRONCHIAL ULTRASOUND;  Surgeon: Shelah Tyler RAMAN, MD;  Location: [facility] ENDOSCOPY;  Service: Pulmonary;;    REVIEW OF SYSTEMS:   Review of Systems  Constitutional: Negative for appetite change, chills, fatigue, fever and unexpected weight change.  HENT:  Negative for mouth sores, nosebleeds, sore throat and trouble swallowing.   Eyes: Negative for eye problems and icterus.  Respiratory: Negative for cough, hemoptysis, shortness of breath and wheezing.   Cardiovascular: Negative for chest pain and leg swelling.  Gastrointestinal: Negative for abdominal pain, constipation, diarrhea, nausea and vomiting.  Genitourinary: Negative for bladder incontinence, difficulty urinating, dysuria, frequency and hematuria.   Musculoskeletal: Negative for back pain, gait problem, neck pain and neck stiffness.  Skin: Negative for itching and rash.  Neurological: Negative for dizziness, extremity weakness, gait problem, headaches, light-headedness and seizures.  Hematological: Negative for adenopathy. Does not bruise/bleed easily.  Psychiatric/Behavioral: Negative for confusion, depression and sleep disturbance. The patient is not nervous/anxious.     PHYSICAL EXAMINATION:  There were no vitals taken for this visit.  ECOG PERFORMANCE STATUS: 1  Physical Exam  Constitutional: Oriented to person, place, and time and well-developed, well-nourished, and in no distress.  HENT:  Head: Normocephalic and atraumatic.  Mouth/Throat: Oropharynx is clear and moist. No oropharyngeal exudate.  Eyes: Conjunctivae are normal. Right eye exhibits no discharge. Left eye exhibits no discharge. No scleral icterus.  Neck: Normal range of motion. Neck supple.  Cardiovascular: Normal rate, regular rhythm, normal heart sounds and intact distal pulses.   Pulmonary/Chest: Effort normal and breath sounds normal.  No respiratory distress. No wheezes. No rales.  Abdominal: Soft. Bowel sounds are normal. Exhibits no distension and no mass. There is no tenderness.  Musculoskeletal: Normal range of motion. Exhibits no edema.  Lymphadenopathy:    No cervical adenopathy.  Neurological: Alert and oriented to person, place, and time. Exhibits normal muscle tone. Gait normal. Coordination normal.  Skin: Skin is warm and dry. No rash noted. Not diaphoretic. No erythema. No pallor.  Psychiatric: Mood, memory and judgment normal.  Vitals reviewed.  LABORATORY DATA: Lab Results  Component Value Date   WBC 6.3 07/27/2023   HGB 12.3 (L) 07/27/2023   HCT 36.3 (L) 07/27/2023   MCV 94.5 07/27/2023   PLT 237 07/27/2023      Chemistry      Component Value Date/Time   NA 140 07/27/2023 0826   NA 137 06/06/2021 0919   K 4.2  07/27/2023 0826   CL 106 07/27/2023 0826   CO2 27 07/27/2023 0826   BUN 19 07/27/2023 0826   BUN 19 06/06/2021 0919   CREATININE 1.09 07/27/2023 0826   CREATININE 1.04 01/01/2016 0801      Component Value Date/Time   CALCIUM  8.8 (L) 07/27/2023 0826   ALKPHOS 69 07/27/2023 0826   AST 24 07/27/2023 0826   ALT 28 07/27/2023 0826   BILITOT 0.6 07/27/2023 0826       RADIOGRAPHIC STUDIES:  CUP PACEART REMOTE DEVICE CHECK Result Date: 07/22/2023 PPM Scheduled remote reviewed. Normal device function.  Presenting rhythm:  AP/BiV pace 5 NSVT events, HR's 154-188, EGM's c/w V>A, 5-21 beats in duration Next remote 91 days. LA, CVRS    ASSESSMENT/PLAN:  This is a very pleasant 68 year old never smoker Caucasian male with stage IV lung cancer, poorly differentiated carcinoma, felt to be non-small cell lung cancer. He was diagnosed in October 2024.  He presented with metabolic central left upper lobe lung mass invading the mediastinum, small hypermetabolic right hilar lymph nodes, bilateral adrenal gland masses and splenic lesion consistent metastatic disease, hypermetabolic focus in the  right gluteus maximus muscle.  His molecular studies show he is MSI high    He is currently undergoing palliative immunotherapy with Keytruda  200 mg IV every 3 weeks. He is status post 7 cycles and tolerates it well.    Labs were reviewed. Recommend that he proceed with cycle #11 today as scheduled.    We will see him back in 3 weeks for evaluation and repeat blood work before undergoing cycle #12   Patient also has a history of lymphoma and he did have radiation to the chest for Hodgkin's lymphoma in 2002 and chemotherapy.  He was previously followed by Dr. Timmy.    He will continue for now to have his PSA checked every 6 months or so with his urologist or PCP.   I will arrange for a restaging CT scan of the CAP prior to his next cycle of treatment.    The patient was advised to call immediately if she has any concerning symptoms in the interval. The patient voices understanding of current disease status and treatment options and is in agreement with the current care plan. All questions were answered. The patient knows to call the clinic with any problems, questions or concerns. We can certainly see the patient much sooner if necessary      No orders of the defined types were placed in this encounter.    The total time spent in the appointment was 20-29 minutes  Ragan Duhon L Jayr Lupercio, PA-C 08/11/23

## 2023-08-12 NOTE — Telephone Encounter (Signed)
 Pt is due for CPE. Looks like he is coming in next week

## 2023-08-17 ENCOUNTER — Inpatient Hospital Stay: Attending: Physician Assistant

## 2023-08-17 ENCOUNTER — Inpatient Hospital Stay (HOSPITAL_BASED_OUTPATIENT_CLINIC_OR_DEPARTMENT_OTHER): Attending: Physician Assistant | Admitting: Physician Assistant

## 2023-08-17 ENCOUNTER — Inpatient Hospital Stay

## 2023-08-17 VITALS — BP 129/75 | HR 81 | Temp 98.6°F | Resp 20 | Wt 180.4 lb

## 2023-08-17 DIAGNOSIS — Z5112 Encounter for antineoplastic immunotherapy: Secondary | ICD-10-CM

## 2023-08-17 DIAGNOSIS — C3492 Malignant neoplasm of unspecified part of left bronchus or lung: Secondary | ICD-10-CM

## 2023-08-17 DIAGNOSIS — Z95828 Presence of other vascular implants and grafts: Secondary | ICD-10-CM

## 2023-08-17 DIAGNOSIS — C7971 Secondary malignant neoplasm of right adrenal gland: Secondary | ICD-10-CM | POA: Diagnosis not present

## 2023-08-17 DIAGNOSIS — C7972 Secondary malignant neoplasm of left adrenal gland: Secondary | ICD-10-CM | POA: Diagnosis not present

## 2023-08-17 DIAGNOSIS — C3412 Malignant neoplasm of upper lobe, left bronchus or lung: Secondary | ICD-10-CM | POA: Diagnosis not present

## 2023-08-17 LAB — CMP (CANCER CENTER ONLY)
ALT: 18 U/L (ref 0–44)
AST: 20 U/L (ref 15–41)
Albumin: 4.1 g/dL (ref 3.5–5.0)
Alkaline Phosphatase: 74 U/L (ref 38–126)
Anion gap: 5 (ref 5–15)
BUN: 19 mg/dL (ref 8–23)
CO2: 28 mmol/L (ref 22–32)
Calcium: 9.2 mg/dL (ref 8.9–10.3)
Chloride: 105 mmol/L (ref 98–111)
Creatinine: 1.16 mg/dL (ref 0.61–1.24)
GFR, Estimated: >60 mL/min (ref >60)
Glucose, Bld: 122 mg/dL — ABNORMAL HIGH (ref 70–99)
Potassium: 4.3 mmol/L (ref 3.5–5.1)
Sodium: 138 mmol/L (ref 135–145)
Total Bilirubin: 0.9 mg/dL (ref 0.0–1.2)
Total Protein: 6.4 g/dL — ABNORMAL LOW (ref 6.5–8.1)

## 2023-08-17 LAB — CBC WITH DIFFERENTIAL (CANCER CENTER ONLY)
Abs Immature Granulocytes: 0.04 K/uL (ref 0.00–0.07)
Basophils Absolute: 0.1 K/uL (ref 0.0–0.1)
Basophils Relative: 1 %
Eosinophils Absolute: 0.2 K/uL (ref 0.0–0.5)
Eosinophils Relative: 3 %
HCT: 37.1 % — ABNORMAL LOW (ref 39.0–52.0)
Hemoglobin: 12.7 g/dL — ABNORMAL LOW (ref 13.0–17.0)
Immature Granulocytes: 1 %
Lymphocytes Relative: 12 %
Lymphs Abs: 0.9 K/uL (ref 0.7–4.0)
MCH: 31.8 pg (ref 26.0–34.0)
MCHC: 34.2 g/dL (ref 30.0–36.0)
MCV: 92.8 fL (ref 80.0–100.0)
Monocytes Absolute: 0.4 K/uL (ref 0.1–1.0)
Monocytes Relative: 5 %
Neutro Abs: 5.6 K/uL (ref 1.7–7.7)
Neutrophils Relative %: 78 %
Platelet Count: 242 K/uL (ref 150–400)
RBC: 4 MIL/uL — ABNORMAL LOW (ref 4.22–5.81)
RDW: 12.9 % (ref 11.5–15.5)
WBC Count: 7.1 K/uL (ref 4.0–10.5)
nRBC: 0 % (ref 0.0–0.2)

## 2023-08-17 LAB — TSH: TSH: 4.43 u[IU]/mL (ref 0.350–4.500)

## 2023-08-17 MED ORDER — SODIUM CHLORIDE 0.9% FLUSH: 10.0000 mL | INTRAVENOUS | Status: DC | PRN

## 2023-08-17 MED ORDER — SODIUM CHLORIDE 0.9 % IV SOLN
200.0000 mg | Freq: Once | INTRAVENOUS | Status: AC
  Administered 2023-08-17: 200 mg via INTRAVENOUS
  Filled 2023-08-17: qty 200

## 2023-08-17 MED ORDER — SODIUM CHLORIDE 0.9% FLUSH
10.0000 mL | Freq: Once | INTRAVENOUS | Status: AC
Start: 2023-08-17 — End: 2023-08-17
  Administered 2023-08-17: 10 mL

## 2023-08-17 MED ORDER — HEPARIN SOD (PORK) LOCK FLUSH 100 UNIT/ML IV SOLN
500.0000 [IU] | Freq: Once | INTRAVENOUS | Status: DC | PRN
Start: 2023-08-17 — End: 2023-08-17

## 2023-08-17 MED ORDER — SODIUM CHLORIDE 0.9 % IV SOLN: INTRAVENOUS | Status: DC

## 2023-08-17 NOTE — Patient Instructions (Signed)

## 2023-08-18 LAB — T4: T4, Total: 7.2 ug/dL (ref 4.5–12.0)

## 2023-08-20 ENCOUNTER — Encounter: Payer: Self-pay | Admitting: Family Medicine

## 2023-08-20 ENCOUNTER — Other Ambulatory Visit (HOSPITAL_COMMUNITY): Payer: Self-pay

## 2023-08-20 ENCOUNTER — Ambulatory Visit (INDEPENDENT_AMBULATORY_CARE_PROVIDER_SITE_OTHER): Admitting: Family Medicine

## 2023-08-20 ENCOUNTER — Other Ambulatory Visit: Payer: Self-pay

## 2023-08-20 VITALS — BP 138/90 | HR 90 | Temp 98.1°F | Ht 71.0 in | Wt 180.6 lb

## 2023-08-20 DIAGNOSIS — Z0000 Encounter for general adult medical examination without abnormal findings: Secondary | ICD-10-CM

## 2023-08-20 LAB — PSA: PSA: 3.26 ng/mL (ref 0.10–4.00)

## 2023-08-20 LAB — LIPID PANEL
Cholesterol: 97 mg/dL (ref 0–200)
HDL: 34.2 mg/dL — ABNORMAL LOW (ref >39.00)
LDL Cholesterol: 47 mg/dL (ref 0–99)
NonHDL: 62.61
Total CHOL/HDL Ratio: 3
Triglycerides: 76 mg/dL (ref 0.0–149.0)
VLDL: 15.2 mg/dL (ref 0.0–40.0)

## 2023-08-20 LAB — HEMOGLOBIN A1C: Hgb A1c MFr Bld: 4.9 % (ref 4.6–6.5)

## 2023-08-20 MED ORDER — LEVOTHYROXINE SODIUM 75 MCG PO TABS
75.0000 ug | ORAL_TABLET | Freq: Every day | ORAL | 3 refills | Status: DC
  Filled 2023-08-20 – 2023-10-09 (×4): qty 90, 90d supply, fill #0
  Filled 2023-12-12 – 2024-01-09 (×2): qty 90, 90d supply, fill #1
  Filled 2024-02-16: qty 90, 90d supply, fill #2

## 2023-08-20 MED ORDER — TRAZODONE HCL 50 MG PO TABS
50.0000 mg | ORAL_TABLET | Freq: Every evening | ORAL | 3 refills | Status: AC | PRN
  Filled 2023-08-20: qty 90, 90d supply, fill #0
  Filled 2023-09-24 – 2023-11-10 (×2): qty 90, 90d supply, fill #1
  Filled 2023-12-12 – 2024-02-16 (×2): qty 90, 90d supply, fill #2

## 2023-08-20 NOTE — Progress Notes (Signed)
 Subjective:    Patient ID: Tyler Hendrix, male    DOB: 28-Apr-1955, 68 y.o.   MRN: [id number]  HPI Here for a well exam. He feels well in general. He sees Oncology regularly for the lung cancer. He sees Dr. Cam for urologic visits. His PSA had peaked at 5.7 in 2023, then it went down to 5.1 in 2024, then it was 4.41 on 03-24-23. He sees Cardiology regularly, and he was referred to the CHF clinic. He saw Dr. Odis Brownie there in April. His last ECHO in March showed an EF of 25-30% with global LV hypokinesis. His labs a few weeks ago showed a normal creatinine of 1.16 and a Hgb of 12.7.    Review of Systems  Constitutional: Negative.   HENT: Negative.    Eyes: Negative.   Respiratory: Negative.    Cardiovascular: Negative.   Gastrointestinal: Negative.   Genitourinary: Negative.   Musculoskeletal: Negative.   Skin: Negative.   Neurological: Negative.   Psychiatric/Behavioral: Negative.         Objective:   Physical Exam Constitutional:      General: He is not in acute distress.    Appearance: Normal appearance. He is well-developed. He is not diaphoretic.  HENT:     Head: Normocephalic and atraumatic.     Right Ear: External ear normal.     Left Ear: External ear normal.     Nose: Nose normal.     Mouth/Throat:     Pharynx: No oropharyngeal exudate.  Eyes:     General: No scleral icterus.       Right eye: No discharge.        Left eye: No discharge.     Conjunctiva/sclera: Conjunctivae normal.     Pupils: Pupils are equal, round, and reactive to light.  Neck:     Thyroid : No thyromegaly.     Vascular: No JVD.     Trachea: No tracheal deviation.  Cardiovascular:     Rate and Rhythm: Normal rate and regular rhythm.     Pulses: Normal pulses.     Heart sounds: Normal heart sounds. No murmur heard.    No friction rub. No gallop.  Pulmonary:     Effort: Pulmonary effort is normal. No respiratory distress.     Breath sounds: Normal breath sounds. No wheezing or  rales.  Chest:     Chest wall: No tenderness.  Abdominal:     General: Bowel sounds are normal. There is no distension.     Palpations: Abdomen is soft. There is no mass.     Tenderness: There is no abdominal tenderness. There is no guarding or rebound.  Genitourinary:    Penis: No tenderness.   Musculoskeletal:        General: No tenderness. Normal range of motion.     Cervical back: Neck supple.  Lymphadenopathy:     Cervical: No cervical adenopathy.  Skin:    General: Skin is warm and dry.     Coloration: Skin is not pale.     Findings: No erythema or rash.  Neurological:     General: No focal deficit present.     Mental Status: He is alert and oriented to person, place, and time.     Cranial Nerves: No cranial nerve deficit.     Motor: No abnormal muscle tone.     Coordination: Coordination normal.     Deep Tendon Reflexes: Reflexes are normal and symmetric. Reflexes normal.  Psychiatric:  Mood and Affect: Mood normal.        Behavior: Behavior normal.        Thought Content: Thought content normal.        Judgment: Judgment normal.           Assessment & Plan:  Well exam. We discussed diet and exercise. Get labs for an A1c, another PSA, and lipids.  Garnette Olmsted, MD

## 2023-08-21 ENCOUNTER — Ambulatory Visit: Payer: Self-pay | Admitting: Family Medicine

## 2023-08-22 ENCOUNTER — Other Ambulatory Visit: Payer: Self-pay | Admitting: Family Medicine

## 2023-08-22 DIAGNOSIS — E782 Mixed hyperlipidemia: Secondary | ICD-10-CM

## 2023-08-24 ENCOUNTER — Other Ambulatory Visit (HOSPITAL_COMMUNITY): Payer: Self-pay

## 2023-08-24 ENCOUNTER — Other Ambulatory Visit: Payer: Self-pay

## 2023-08-24 MED ORDER — SPIRONOLACTONE 25 MG PO TABS
12.5000 mg | ORAL_TABLET | Freq: Every day | ORAL | 3 refills | Status: DC
  Filled 2023-08-24: qty 45, 90d supply, fill #0

## 2023-08-24 MED ORDER — ATORVASTATIN CALCIUM 40 MG PO TABS
40.0000 mg | ORAL_TABLET | Freq: Every day | ORAL | 3 refills | Status: AC
  Filled 2023-08-24: qty 90, 90d supply, fill #0
  Filled 2023-09-24 – 2023-11-10 (×2): qty 90, 90d supply, fill #1

## 2023-09-02 ENCOUNTER — Ambulatory Visit (HOSPITAL_COMMUNITY)
Admission: RE | Admit: 2023-09-02 | Discharge: 2023-09-02 | Disposition: A | Discharge status: Home / Self Care | Source: Ambulatory Visit | Attending: Physician Assistant | Admitting: Physician Assistant

## 2023-09-02 DIAGNOSIS — J984 Other disorders of lung: Secondary | ICD-10-CM | POA: Diagnosis not present

## 2023-09-02 DIAGNOSIS — R918 Other nonspecific abnormal finding of lung field: Secondary | ICD-10-CM | POA: Diagnosis not present

## 2023-09-02 DIAGNOSIS — C3492 Malignant neoplasm of unspecified part of left bronchus or lung: Secondary | ICD-10-CM | POA: Insufficient documentation

## 2023-09-02 DIAGNOSIS — K4020 Bilateral inguinal hernia, without obstruction or gangrene, not specified as recurrent: Secondary | ICD-10-CM | POA: Diagnosis not present

## 2023-09-02 MED ORDER — SODIUM CHLORIDE (PF) 0.9 % IJ SOLN
INTRAMUSCULAR | Status: AC
  Filled 2023-09-02: qty 50

## 2023-09-02 MED ORDER — HEPARIN SOD (PORK) LOCK FLUSH 100 UNIT/ML IV SOLN
500.0000 [IU] | Freq: Once | INTRAVENOUS | Status: AC
  Administered 2023-09-02: 500 [IU] via INTRAVENOUS

## 2023-09-02 MED ORDER — HEPARIN SOD (PORK) LOCK FLUSH 100 UNIT/ML IV SOLN
INTRAVENOUS | Status: AC
  Filled 2023-09-02: qty 5

## 2023-09-02 MED ORDER — IOHEXOL 9 MG/ML PO SOLN
1000.0000 mL | ORAL | Status: AC
  Administered 2023-09-02: 1000 mL via ORAL

## 2023-09-02 MED ORDER — IOHEXOL 300 MG/ML  SOLN
100.0000 mL | Freq: Once | INTRAMUSCULAR | Status: AC | PRN
  Administered 2023-09-02: 100 mL via INTRAVENOUS

## 2023-09-08 ENCOUNTER — Inpatient Hospital Stay

## 2023-09-08 ENCOUNTER — Encounter: Payer: Self-pay | Admitting: Internal Medicine

## 2023-09-08 ENCOUNTER — Inpatient Hospital Stay (HOSPITAL_BASED_OUTPATIENT_CLINIC_OR_DEPARTMENT_OTHER): Admitting: Internal Medicine

## 2023-09-08 VITALS — BP 138/65 | HR 66 | Temp 97.9°F | Resp 16 | Ht 71.0 in | Wt 182.0 lb

## 2023-09-08 DIAGNOSIS — C3492 Malignant neoplasm of unspecified part of left bronchus or lung: Secondary | ICD-10-CM | POA: Diagnosis not present

## 2023-09-08 DIAGNOSIS — C3412 Malignant neoplasm of upper lobe, left bronchus or lung: Secondary | ICD-10-CM | POA: Diagnosis not present

## 2023-09-08 DIAGNOSIS — C7971 Secondary malignant neoplasm of right adrenal gland: Secondary | ICD-10-CM | POA: Diagnosis not present

## 2023-09-08 DIAGNOSIS — Z95828 Presence of other vascular implants and grafts: Secondary | ICD-10-CM

## 2023-09-08 DIAGNOSIS — Z5112 Encounter for antineoplastic immunotherapy: Secondary | ICD-10-CM | POA: Diagnosis not present

## 2023-09-08 DIAGNOSIS — C7972 Secondary malignant neoplasm of left adrenal gland: Secondary | ICD-10-CM | POA: Diagnosis not present

## 2023-09-08 LAB — CBC WITH DIFFERENTIAL (CANCER CENTER ONLY)
Abs Immature Granulocytes: 0.03 K/uL (ref 0.00–0.07)
Basophils Absolute: 0 K/uL (ref 0.0–0.1)
Basophils Relative: 1 %
Eosinophils Absolute: 0.2 K/uL (ref 0.0–0.5)
Eosinophils Relative: 2 %
HCT: 38.9 % — ABNORMAL LOW (ref 39.0–52.0)
Hemoglobin: 13.3 g/dL (ref 13.0–17.0)
Immature Granulocytes: 0 %
Lymphocytes Relative: 12 %
Lymphs Abs: 0.9 K/uL (ref 0.7–4.0)
MCH: 31.6 pg (ref 26.0–34.0)
MCHC: 34.2 g/dL (ref 30.0–36.0)
MCV: 92.4 fL (ref 80.0–100.0)
Monocytes Absolute: 0.3 K/uL (ref 0.1–1.0)
Monocytes Relative: 5 %
Neutro Abs: 5.6 K/uL (ref 1.7–7.7)
Neutrophils Relative %: 80 %
Platelet Count: 227 K/uL (ref 150–400)
RBC: 4.21 MIL/uL — ABNORMAL LOW (ref 4.22–5.81)
RDW: 13.2 % (ref 11.5–15.5)
WBC Count: 7 K/uL (ref 4.0–10.5)
nRBC: 0 % (ref 0.0–0.2)

## 2023-09-08 LAB — CMP (CANCER CENTER ONLY)
ALT: 18 U/L (ref 0–44)
AST: 19 U/L (ref 15–41)
Albumin: 4.2 g/dL (ref 3.5–5.0)
Alkaline Phosphatase: 80 U/L (ref 38–126)
Anion gap: 5 (ref 5–15)
BUN: 20 mg/dL (ref 8–23)
CO2: 28 mmol/L (ref 22–32)
Calcium: 9.1 mg/dL (ref 8.9–10.3)
Chloride: 105 mmol/L (ref 98–111)
Creatinine: 1.32 mg/dL — ABNORMAL HIGH (ref 0.61–1.24)
GFR, Estimated: 59 mL/min — ABNORMAL LOW (ref >60)
Glucose, Bld: 161 mg/dL — ABNORMAL HIGH (ref 70–99)
Potassium: 4.2 mmol/L (ref 3.5–5.1)
Sodium: 138 mmol/L (ref 135–145)
Total Bilirubin: 0.9 mg/dL (ref 0.0–1.2)
Total Protein: 6.9 g/dL (ref 6.5–8.1)

## 2023-09-08 MED ORDER — SODIUM CHLORIDE 0.9 % IV SOLN: INTRAVENOUS | Status: DC

## 2023-09-08 MED ORDER — SODIUM CHLORIDE 0.9 % IV SOLN
200.0000 mg | Freq: Once | INTRAVENOUS | Status: AC
  Administered 2023-09-08: 200 mg via INTRAVENOUS
  Filled 2023-09-08: qty 200

## 2023-09-08 MED ORDER — SODIUM CHLORIDE 0.9% FLUSH
10.0000 mL | Freq: Once | INTRAVENOUS | Status: AC
  Administered 2023-09-08: 10 mL

## 2023-09-08 MED ORDER — SODIUM CHLORIDE 0.9% FLUSH
10.0000 mL | INTRAVENOUS | Status: DC | PRN
  Administered 2023-09-08: 10 mL

## 2023-09-08 NOTE — Patient Instructions (Signed)

## 2023-09-08 NOTE — Progress Notes (Signed)
 [facility] [phone]   [phone]  OFFICE PROGRESS NOTE  Tyler Hendrix LABOR, MD [address] 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 He was diagnosed in October 2024.  He presented with metabolic central left upper lobe lung mass invading the mediastinum, small hypermetabolic right hilar lymph nodes, bilateral adrenal gland masses and splenic lesion consistent metastatic disease, hypermetabolic focus in the right gluteus maximus muscle.    Molecular studies by [employer] show MSI high.  Biomarker Findings Microsatellite status - MSI-High Tumor Mutational Burden - 29 Muts/Mb HRD signature - HRDsig Negative Genomic Findings For a complete list of the genes assayed, please refer to the Appendix. PALB2 F440fs*12 KRAS G12A NF1 I679fs*21 PIK3CA P17S - subclonal, H1047R? ASXL1 G645fs*58, K618fs*1 - subclonal? MSH2 loss exons 9-16 RAC1 P29H TP53 M237I 7 Disease relevant genes with no reportable alterations: ALK, BRAF, EGFR, ERBB2, MET, RET, ROS1   PDL1 98%.   PRIOR THERAPY: None   CURRENT THERAPY: Immunotherapy with Keytruda  200 mg IV every 3 weeks.  First dose on 12/29/22.  Status post 12 cycles.  INTERVAL HISTORY: Tyler Hendrix 68 y.o. male returns to the clinic today for follow-up visit.Discussed the use of AI scribe software for clinical note transcription with the patient, who gave verbal consent to proceed.  History of Present Illness Tyler Hendrix is a 68 year old male with stage four non-small cell lung cancer who presents for evaluation with repeat CT scan for restaging of his disease.  He was diagnosed with stage four non-small cell lung cancer in October 2024, characterized by MSI high and PD1 expression of 98%. He has been undergoing treatment with immunotherapy, specifically Keytruda  200 mg IV every three weeks, and is currently post twelve cycles.  No new  chest pain, breathing issues, or unintentional weight loss since his last visit. He has gained back the weight lost due to previous diarrhea attacks and is currently slightly heavier than when he first started treatment last fall. He aims to return to his pre-injury weight from over a year ago, which is about eight pounds less than his current weight.  His physical activity includes going to the gym for weight lifting, taking a couple of thirty-minute walks each week, and attempting some jogging. He also engages in yard work and maintains a couple of rental houses, although the heat limits his outdoor activities.  No hemoptysis, but he reports having mucus.    MEDICAL HISTORY: Past Medical History:  Diagnosis Date   Abnormal TSH    Arthritis    ddd   Atrial fibrillation and flutter (HCC)    s/p DCCV 03/26/2018   CAD in native artery    a. 11/2015: s/p 2 vessel PCI of the first diagonal branch of the LAD in the mid left circumflex with DES. // s/p CABG 01/2018   Chronic combined systolic and diastolic CHF (congestive heart failure) (HCC)    sees Dr. Kelsie    Complete heart block (HCC)    a. s/p Medtronic ppm 10/2015.   Dyspnea    with exertion   Family history of bladder cancer    Family history of prostate cancer    Family history of stomach cancer    History of radiation therapy 2002   Hodgkin disease (HCC) 2002   a. s/p chest radiation and chemotherapy   Hypertension    Hypothyroidism    sees Dr.  Gherghe    Incidental pulmonary nodule 01/19/2018   Left apical opacity - possible scarring   Inguinal hernia 10/14/2022   bilateral   Mitral regurgitation    s/p bioprosthetic MV replacement 01/2018   Non-small cell lung cancer (NSCLC) (HCC) 11/2022   Pacemaker 10/2015   Medtronic   PVC's (premature ventricular contractions)    Right bundle branch block    S/P CABG x 2 01/27/2018   LIMA to LAD, SVG to RCA, EVH via right thigh   S/P mitral valve replacement with bioprosthetic  valve 01/27/2018   29 mm [facility] Mitral stented bovine pericardial tissue valve    ALLERGIES:  is allergic to other.  MEDICATIONS:  Current Outpatient Medications  Medication Sig Dispense Refill   aspirin  EC 81 MG tablet Take 81 mg by mouth daily.     atorvastatin  (LIPITOR ) 40 MG tablet Take 1 tablet (40 mg total) by mouth daily. 90 tablet 3   Cholecalciferol  125 MCG (5000 UT) TABS Take 1 tablet by mouth daily. 30 tablet 6   levothyroxine  (SYNTHROID ) 75 MCG tablet Take 1 tablet (75 mcg total) by mouth daily before breakfast. 90 tablet 3   methylPREDNISolone  (MEDROL  DOSEPAK) 4 MG TBPK tablet Use as directed on package instructions 21 tablet 0   sacubitril -valsartan  (ENTRESTO ) 24-26 MG Take 1 tablet by mouth 2 (two) times daily. 180 tablet 3   spironolactone  (ALDACTONE ) 25 MG tablet Take one-half tablet (12.5 mg total) by mouth daily. 45 tablet 3   tamsulosin  (FLOMAX ) 0.4 MG CAPS capsule Take 1 capsule (0.4 mg total) by mouth daily. 90 capsule 3   traZODone  (DESYREL ) 50 MG tablet Take 1 tablet (50 mg total) by mouth at bedtime as needed for sleep. 90 tablet 3   No current facility-administered medications for this visit.    SURGICAL HISTORY:  Past Surgical History:  Procedure Laterality Date   A-FLUTTER ABLATION N/A 06/20/2021   Procedure: A-FLUTTER ABLATION;  Surgeon: Waddell Danelle ORN, MD;  Location: [facility] INVASIVE CV LAB;  Service: Cardiovascular;  Laterality: N/A;   BIV UPGRADE N/A 02/08/2019   Procedure: UPGRADE TO BIV PPM;  Surgeon: Kelsie Agent, MD;  Location: [facility] INVASIVE CV LAB;  Service: Cardiovascular;  Laterality: N/A;   BRONCHIAL BIOPSY  12/02/2022   Procedure: BRONCHIAL BIOPSIES;  Surgeon: Shelah Lamar RAMAN, MD;  Location: [facility] ENDOSCOPY;  Service: Pulmonary;;   BRONCHIAL BRUSHINGS  12/02/2022   Procedure: BRONCHIAL BRUSHINGS;  Surgeon: Shelah Lamar RAMAN, MD;  Location: [facility] ENDOSCOPY;  Service: Pulmonary;;   BRONCHIAL NEEDLE ASPIRATION BIOPSY  12/02/2022   Procedure: BRONCHIAL  NEEDLE ASPIRATION BIOPSIES;  Surgeon: Shelah Lamar RAMAN, MD;  Location: [facility] ENDOSCOPY;  Service: Pulmonary;;   BRONCHIAL WASHINGS  12/02/2022   Procedure: BRONCHIAL WASHINGS;  Surgeon: Shelah Lamar RAMAN, MD;  Location: [facility] ENDOSCOPY;  Service: Pulmonary;;   CARDIAC CATHETERIZATION N/A 11/02/2015   Procedure: Left Heart Cath and Coronary Angiography;  Surgeon: Ozell Fell, MD;  Location: [facility] INVASIVE CV LAB;  Service: Cardiovascular;  Laterality: N/A;   CARDIAC CATHETERIZATION N/A 11/21/2015   Procedure: Coronary Stent Intervention;  Surgeon: Ozell Fell, MD;  Location: [facility] INVASIVE CV LAB;  Service: Cardiovascular;  Laterality: N/A;   CARDIAC CATHETERIZATION  11/2017   CARDIOVERSION N/A 03/26/2018   Procedure: CARDIOVERSION;  Surgeon: Loni Soyla LABOR, MD;  Location: [facility] ENDOSCOPY;  Service: Cardiovascular;  Laterality: N/A;   COLONOSCOPY  01/16/2015   per Dr. Teressa, benign polyps, repeat in 10 yrs    CORONARY ARTERY BYPASS GRAFT N/A 01/27/2018  Procedure: CORONARY ARTERY BYPASS GRAFTING (CABG) x two, using left internal mammary artery and right leg greater saphenous vein harvested endoscopically;  Surgeon: Dusty Sudie DEL, MD;  Location: [facility] OR;  Service: Open Heart Surgery;  Laterality: N/A;   DIRECT LARYNGOSCOPY Bilateral 04/20/2023   Procedure: LARYNGOSCOPY, MICRO DIRECT WITH PROLARYN INJECTION;  Surgeon: Carlie Clark, MD;  Location: [facility] OR;  Service: ENT;  Laterality: Bilateral;   EP IMPLANTABLE DEVICE N/A 11/02/2015   MDT Adivisa MRI conditional dual chamber pacemaker implanted by Dr Kelsie for complete heart block   FEMUR IM NAIL Left 07/01/2022   Procedure: INTRAMEDULLARY (IM) NAIL FEMORAL LEFT ANTEGRADE;  Surgeon: Celena Sharper, MD;  Location: [facility] OR;  Service: Orthopedics;  Laterality: Left;   IR IMAGING GUIDED PORT INSERTION  01/02/2023   KNEE SURGERY Right 1972   LEAD EXTRACTION N/A 10/20/2022   Procedure: LEAD EXTRACTION;  Surgeon: Waddell Danelle ORN, MD;  Location: [facility] INVASIVE CV LAB;   Service: Cardiovascular;  Laterality: N/A;   LUMBAR LAMINECTOMY  1996   MITRAL VALVE REPLACEMENT N/A 01/27/2018   Procedure: MITRAL VALVE (MV) REPLACEMENT using Magna Mitral Ease Valve Size ;  Surgeon: Dusty Sudie DEL, MD;  Location: [facility] OR;  Service: Open Heart Surgery;  Laterality: N/A;   RIGHT/LEFT HEART CATH AND CORONARY ANGIOGRAPHY N/A 12/02/2017   Procedure: RIGHT/LEFT HEART CATH AND CORONARY ANGIOGRAPHY;  Surgeon: Dann Candyce RAMAN, MD;  Location: [facility] INVASIVE CV LAB;  Service: Cardiovascular;  Laterality: N/A;   TEE WITHOUT CARDIOVERSION N/A 12/09/2017   Procedure: TRANSESOPHAGEAL ECHOCARDIOGRAM (TEE);  Surgeon: Lonni Slain, MD;  Location: [facility] ENDOSCOPY;  Service: Cardiovascular;  Laterality: N/A;   TEE WITHOUT CARDIOVERSION N/A 01/27/2018   Procedure: TRANSESOPHAGEAL ECHOCARDIOGRAM (TEE);  Surgeon: Dusty Sudie DEL, MD;  Location: [facility] OR;  Service: Open Heart Surgery;  Laterality: N/A;   VIDEO BRONCHOSCOPY WITH RADIAL ENDOBRONCHIAL ULTRASOUND  12/02/2022   Procedure: VIDEO BRONCHOSCOPY WITH RADIAL ENDOBRONCHIAL ULTRASOUND;  Surgeon: Shelah Lamar RAMAN, MD;  Location: [facility] ENDOSCOPY;  Service: Pulmonary;;    REVIEW OF SYSTEMS:  Constitutional: negative Eyes: negative Ears, nose, mouth, throat, and face: negative Respiratory: negative Cardiovascular: negative Gastrointestinal: negative Genitourinary:negative Integument/breast: negative Hematologic/lymphatic: negative Musculoskeletal:negative Neurological: negative Behavioral/Psych: negative Endocrine: negative Allergic/Immunologic: negative   PHYSICAL EXAMINATION: General appearance: alert, cooperative, and no distress Head: Normocephalic, without obvious abnormality, atraumatic Neck: no adenopathy, no JVD, supple, symmetrical, trachea midline, and thyroid  not enlarged, symmetric, no tenderness/mass/nodules Lymph nodes: Cervical, supraclavicular, and axillary nodes normal. Resp: clear to auscultation  bilaterally Back: symmetric, no curvature. ROM normal. No CVA tenderness. Cardio: regular rate and rhythm, S1, S2 normal, no murmur, click, rub or gallop GI: soft, non-tender; bowel sounds normal; no masses,  no organomegaly Extremities: extremities normal, atraumatic, no cyanosis or edema Neurologic: Alert and oriented X 3, normal strength and tone. Normal symmetric reflexes. Normal coordination and gait  ECOG PERFORMANCE STATUS: 1 - Symptomatic but completely ambulatory  Blood pressure 138/65, pulse 66, temperature 97.9 F (36.6 C), temperature source Temporal, resp. rate 16, height 5' 11 (1.803 m), weight 182 lb (82.6 kg), SpO2 100%.  LABORATORY DATA: Lab Results  Component Value Date   WBC 7.0 09/08/2023   HGB 13.3 09/08/2023   HCT 38.9 (L) 09/08/2023   MCV 92.4 09/08/2023   PLT 227 09/08/2023      Chemistry      Component Value Date/Time   NA 138 09/08/2023 0936   NA 137 06/06/2021 0919   K 4.2 09/08/2023 0936   CL 105 09/08/2023 0936  CO2 28 09/08/2023 0936   BUN 20 09/08/2023 0936   BUN 19 06/06/2021 0919   CREATININE 1.32 (H) 09/08/2023 0936   CREATININE 1.04 01/01/2016 0801      Component Value Date/Time   CALCIUM  9.1 09/08/2023 0936   ALKPHOS 80 09/08/2023 0936   AST 19 09/08/2023 0936   ALT 18 09/08/2023 0936   BILITOT 0.9 09/08/2023 0936       RADIOGRAPHIC STUDIES: CT CHEST ABDOMEN PELVIS W CONTRAST Result Date: 09/07/2023 CLINICAL DATA:  Metastatic lung cancer restaging * Tracking Code: BO * EXAM: CT CHEST, ABDOMEN, AND PELVIS WITH CONTRAST TECHNIQUE: Multidetector CT imaging of the chest, abdomen and pelvis was performed following the standard protocol during bolus administration of intravenous contrast. RADIATION DOSE REDUCTION: This exam was performed according to the departmental dose-optimization program which includes automated exposure control, adjustment of the mA and/or kV according to patient size and/or use of iterative reconstruction  technique. CONTRAST:  OMNIPAQUE  IOHEXOL  300 MG/ML  SOLN COMPARISON:  06/25/2023 FINDINGS: CT CHEST FINDINGS Cardiovascular: Aortic atherosclerosis. Right chest port catheter. Left chest multi lead pacer defibrillator. Cardiomegaly. Three-vessel coronary artery calcifications status post median sternotomy and CABG. No pericardial effusion. Mediastinum/Nodes: No enlarged mediastinal, hilar, or axillary lymph nodes. Thyroid  gland, trachea, and esophagus demonstrate no significant findings. Lungs/Pleura: Unchanged spiculated nodule of the paramedian left upper lobe measuring 2.8 x 1.4 cm, previously 2.8 x 1.3 cm when measured with similar technique (series 4, image 45). New small nodule of the right lung base measuring 0.4 cm (series 4, image 129). Otherwise unchanged irregular scarring throughout the left-greater-than-right right lung bases with some associated small calcified nodules (series 4, image 128). No pleural effusion or pneumothorax. Musculoskeletal: No chest wall abnormality. No acute osseous findings. CT ABDOMEN PELVIS FINDINGS Hepatobiliary: No solid liver abnormality is seen. No gallstones, gallbladder wall thickening, or biliary dilatation. Pancreas: Unremarkable. No pancreatic ductal dilatation or surrounding inflammatory changes. Spleen: Normal in size without significant abnormality. Adrenals/Urinary Tract: Unchanged treated appearance of bilateral adrenal metastases no longer discretely measurable (series 2, image 61). Kidneys are normal, without renal calculi, solid lesion, or hydronephrosis. Bladder is unremarkable. Stomach/Bowel: Stomach is within normal limits. Appendix appears normal. No evidence of bowel wall thickening, distention, or inflammatory changes. Vascular/Lymphatic: Aortic atherosclerosis. No enlarged abdominal or pelvic lymph nodes. Reproductive: Prostatomegaly. Other: Bilateral inguinal hernias, containing nonobstructed sigmoid colon on the left and nonobstructed distal small  bowel on the right (series 2, image 116). No ascites. Musculoskeletal: No acute osseous findings. IMPRESSION: 1. Unchanged spiculated nodule of the paramedian left upper lobe measuring 2.8 x 1.4 cm, previously 2.8 x 1.3 cm when measured with similar technique. 2. New small nodule of the right lung base measuring 0.4 cm, nonspecific and possibly infectious or inflammatory but warranting close attention on follow-up. 3. Otherwise unchanged irregular scarring throughout the left-greater-than-right lung bases with some associated small calcified nodules. 4. Unchanged treated appearance of bilateral adrenal metastases, no longer discretely measurable. 5. No evidence of lymphadenopathy or new metastatic disease in the abdomen or pelvis. 6. Bilateral inguinal hernias, containing nonobstructed sigmoid colon on the left and nonobstructed distal small bowel on the right. 7. Prostatomegaly. 8. Coronary artery disease. Aortic Atherosclerosis (ICD10-I70.0). Electronically Signed   By: Marolyn JONETTA Jaksch M.D.   On: 09/07/2023 17:11    ASSESSMENT AND PLAN: This is a very pleasant 68 years old white male with stage IV lung cancer, felt to be poorly differentiated NSCLC. He was diagnosed in October 2024.  He presented with metabolic  central left upper lobe lung mass invading the mediastinum, small hypermetabolic right hilar lymph nodes, bilateral adrenal gland masses and splenic lesion consistent metastatic disease, hypermetabolic focus in the right gluteus maximus muscle.  His molecular studies by [employer] showed MSI high and PD-L1 expression of 98%.  The patient is currently on treatment with immunotherapy with single agent Keytruda  200 Mg IV every 3 weeks status post 12 cycles. He has been tolerating this treatment fairly well. 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 Currently on immunotherapy with Keytruda  200 mg IV every three weeks. Recent CT scan shows stable disease with no significant progression. A 4 mm nodule in the right lung base is noted, likely due to inflammation or mucus plug. Bilateral adrenal metastases are unchanged and too small to measure, indicating a good response to treatment. The main tumor in the left upper lobe remains stable with no significant growth, defined as less than a 25% increase in size. - Continue Keytruda  200 mg IV every three weeks - Monitor 4 mm nodule in the right lung base - Return for next cycle in three weeks He was advised to call immediately if he has any concerning symptoms in the interval. The patient voices understanding of current disease status and treatment options and is in agreement with the current care plan.  All questions were answered. The patient knows to call the clinic with any problems, questions or concerns. We can certainly see the patient much sooner if necessary.  Disclaimer: This note was dictated with voice recognition software. Similar sounding words can inadvertently be transcribed and may not be corrected upon review.

## 2023-09-14 NOTE — Progress Notes (Signed)
 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 He was diagnosed in October 2024.  He presented with metabolic central left upper lobe lung mass invading the mediastinum, small hypermetabolic right hilar lymph nodes, bilateral adrenal gland masses and splenic lesion consistent metastatic disease, hypermetabolic focus in the right gluteus maximus muscle.    Molecular studies by [employer] show MSI high.    Foundation One requested on 12/22/22   PDL1: 98%  PRIOR THERAPY: None  CURRENT THERAPY: Immunotherapy with Keytruda  200 mg IV every 3 weeks.  First dose expected on 12/29/22. Status post 13 cycles.   INTERVAL HISTORY: Tyler Hendrix 68 y.o. male returns to the clinic today for a follow-up visit.  The patient was last seen in the clinic by myself and Dr. Sherrod 3 weeks ago.  He is feeling well today without any concerning complaints.    She started on treatment with immunotherapy with Keytruda . He has been tolerating treatment well.    Denies any fever, chills, or night sweats. Today, he states his breathing is stable. He denies unusual cough. He denies any chest pain.  He denies any hemoptysis. He denies any nausea, vomiting, or constipation. He previously was having diarrhea but was able to identify the cause and has since discontinued a supplement he had been taking and his diarrhea improved. Denies any headaches. He denies rashes or itching.  He is here for evaluation and repeat blood work before undergoing cycle #14.     MEDICAL HISTORY: Past Medical History:  Diagnosis Date   Abnormal TSH    Arthritis    ddd   Atrial fibrillation and flutter (HCC)    s/p DCCV 03/26/2018   CAD in native artery    a. 11/2015: s/p 2 vessel PCI of the first diagonal branch of the LAD in the mid left circumflex with DES. // s/p CABG 01/2018   Chronic  combined systolic and diastolic CHF (congestive heart failure) (HCC)    sees Dr. Kelsie    Complete heart block (HCC)    a. s/p Medtronic ppm 10/2015.   Dyspnea    with exertion   Family history of bladder cancer    Family history of prostate cancer    Family history of stomach cancer    History of radiation therapy 2002   Hodgkin disease (HCC) 2002   a. s/p chest radiation and chemotherapy   Hypertension    Hypothyroidism    sees Dr. 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  MEDICATIONS:  Current Outpatient Medications  Medication Sig Dispense Refill   aspirin  EC 81 MG tablet Take 81 mg by mouth daily. (Patient not taking: Reported on 09/17/2023)     atorvastatin  (LIPITOR ) 40 MG tablet Take 1  tablet (40 mg total) by mouth daily. (Patient taking differently: Take 1 tablet (40 mg total) by mouth daily.) 90 tablet 3   Cholecalciferol  125 MCG (5000 UT) TABS Take 1 tablet by mouth daily. 30 tablet 6   levothyroxine  (SYNTHROID ) 75 MCG tablet Take 1 tablet (75 mcg total) by mouth daily before breakfast. 90 tablet 3   losartan  (COZAAR ) 25 MG tablet Take 0.5 tablets (12.5 mg total) by mouth at bedtime. 45 tablet 3   methylPREDNISolone  (MEDROL  DOSEPAK) 4 MG TBPK tablet Use as directed on package instructions (Patient not taking: Reported on 09/17/2023) 21 tablet 0   tamsulosin  (FLOMAX ) 0.4 MG CAPS capsule Take 1 capsule (0.4 mg total)  by mouth daily. 90 capsule 3   traZODone  (DESYREL ) 50 MG tablet Take 1 tablet (50 mg total) by mouth at bedtime as needed for sleep. 90 tablet 3   No current facility-administered medications for this visit.    SURGICAL HISTORY:  Past Surgical History:  Procedure Laterality Date   A-FLUTTER ABLATION N/A 06/20/2021   Procedure: A-FLUTTER ABLATION;  Surgeon: Waddell Danelle ORN, MD;  [facility] INVASIVE CV LAB;  Service: Cardiovascular;  Laterality: N/A;   BIV UPGRADE N/A 02/08/2019   Procedure: UPGRADE TO BIV PPM;  Surgeon: Kelsie Agent, MD;  Location: [facility] INVASIVE CV LAB;  Service: Cardiovascular;  Laterality: N/A;   BRONCHIAL BIOPSY  12/02/2022   Procedure: BRONCHIAL BIOPSIES;  Surgeon: Shelah Lamar RAMAN, MD;  Location: [facility] ENDOSCOPY;  Service: Pulmonary;;   BRONCHIAL BRUSHINGS  12/02/2022   Procedure: BRONCHIAL BRUSHINGS;  Surgeon: Shelah Lamar RAMAN, MD;  Location: [facility] ENDOSCOPY;  Service: Pulmonary;;   BRONCHIAL NEEDLE ASPIRATION BIOPSY  12/02/2022   Procedure: BRONCHIAL NEEDLE ASPIRATION BIOPSIES;  Surgeon: Shelah Lamar RAMAN, MD;  Location: [facility] ENDOSCOPY;  Service: Pulmonary;;   BRONCHIAL WASHINGS  12/02/2022   Procedure: BRONCHIAL WASHINGS;  Surgeon: Shelah Lamar RAMAN, MD;  Location: [facility] ENDOSCOPY;  Service: Pulmonary;;   CARDIAC CATHETERIZATION N/A 11/02/2015   Procedure: Left Heart Cath and Coronary Angiography;  Surgeon: Ozell Fell, MD;  Location: [facility] INVASIVE CV LAB;  Service: Cardiovascular;  Laterality: N/A;   CARDIAC CATHETERIZATION N/A 11/21/2015   Procedure: Coronary Stent Intervention;  Surgeon: Ozell Fell, MD;  Location: [facility] INVASIVE CV LAB;  Service: Cardiovascular;  Laterality: N/A;   CARDIAC CATHETERIZATION  11/2017   CARDIOVERSION N/A 03/26/2018   Procedure: CARDIOVERSION;  Surgeon: Loni Soyla LABOR, MD;  Location: [facility] ENDOSCOPY;  Service: Cardiovascular;  Laterality: N/A;   COLONOSCOPY  01/16/2015   per Dr. Teressa, benign polyps, repeat in 10 yrs    CORONARY ARTERY BYPASS  GRAFT N/A 01/27/2018   Procedure: CORONARY ARTERY BYPASS GRAFTING (CABG) x two, using left internal mammary artery and right leg greater saphenous vein harvested endoscopically;  Surgeon: Dusty Sudie DEL, MD;  Location: [facility] OR;  Service: Open Heart Surgery;  Laterality: N/A;   DIRECT LARYNGOSCOPY Bilateral 04/20/2023   Procedure: LARYNGOSCOPY, MICRO DIRECT WITH PROLARYN INJECTION;  Surgeon: Carlie Clark, MD;  Location: [facility] OR;  Service: ENT;  Laterality: Bilateral;   EP IMPLANTABLE DEVICE N/A 11/02/2015   MDT Adivisa MRI conditional dual chamber pacemaker implanted by Dr Kelsie for complete heart block   FEMUR IM NAIL Left 07/01/2022   Procedure: INTRAMEDULLARY (IM) NAIL FEMORAL LEFT ANTEGRADE;  Surgeon: Celena Ozell, MD;  Location: [facility] OR;  Service: Orthopedics;  Laterality: Left;   IR IMAGING GUIDED PORT INSERTION  01/02/2023   KNEE SURGERY Right 1972   LEAD EXTRACTION N/A 10/20/2022   Procedure: LEAD EXTRACTION;  Surgeon: Waddell Danelle ORN, MD;  Location: [facility] INVASIVE CV LAB;  Service: Cardiovascular;  Laterality: N/A;   LUMBAR LAMINECTOMY  1996   MITRAL VALVE REPLACEMENT N/A 01/27/2018   Procedure: MITRAL VALVE (MV) REPLACEMENT using Magna Mitral Ease Valve Size ;  Surgeon: Dusty Sudie DEL, MD;  Location: [facility] OR;  Service: Open Heart Surgery;  Laterality: N/A;   RIGHT/LEFT HEART CATH AND CORONARY ANGIOGRAPHY N/A 12/02/2017   Procedure: RIGHT/LEFT HEART CATH AND CORONARY ANGIOGRAPHY;  Surgeon: Dann Candyce RAMAN, MD;  Location: [facility] INVASIVE CV LAB;  Service: Cardiovascular;  Laterality: N/A;   TEE WITHOUT CARDIOVERSION N/A 12/09/2017   Procedure: TRANSESOPHAGEAL ECHOCARDIOGRAM (TEE);  Surgeon: Lonni Slain, MD;  Location: [facility] ENDOSCOPY;  Service: Cardiovascular;  Laterality: N/A;   TEE WITHOUT CARDIOVERSION N/A 01/27/2018   Procedure: TRANSESOPHAGEAL ECHOCARDIOGRAM (TEE);  Surgeon: Dusty Sudie DEL, MD;  Location: [facility] OR;  Service: Open Heart Surgery;  Laterality: N/A;   VIDEO  BRONCHOSCOPY WITH RADIAL ENDOBRONCHIAL ULTRASOUND  12/02/2022   Procedure: VIDEO BRONCHOSCOPY WITH RADIAL ENDOBRONCHIAL ULTRASOUND;  Surgeon: Shelah Lamar RAMAN, MD;  Location: [facility] ENDOSCOPY;  Service: Pulmonary;;    REVIEW OF SYSTEMS:   Review of Systems  Constitutional: Negative for appetite change, chills, fatigue, fever and unexpected weight change.  HENT: Negative for mouth sores, nosebleeds, sore throat and trouble swallowing.   Eyes: Negative for eye problems and icterus.  Respiratory: Negative for cough, hemoptysis, shortness of breath and wheezing.   Cardiovascular: Negative for chest pain and leg swelling.  Gastrointestinal: Negative for abdominal pain, constipation, diarrhea, nausea and vomiting.  Genitourinary: Negative for bladder incontinence, difficulty urinating, dysuria, frequency and hematuria.   Musculoskeletal: Negative for back pain, gait problem, neck pain and neck stiffness.  Skin: Negative for itching and rash.  Neurological: Negative for dizziness, extremity weakness, gait problem, headaches, light-headedness and seizures.  Hematological: Negative for adenopathy. Does not bruise/bleed easily.  Psychiatric/Behavioral: Negative for confusion, depression and sleep disturbance. The patient is not nervous/anxious.  .     PHYSICAL EXAMINATION:  Blood pressure 121/65, pulse 77, temperature 98.2 F (36.8 C), temperature source Tympanic, resp. rate 18, height 5' 11 (1.803 m), weight 184 lb 14.4 oz (83.9 kg), SpO2 96%.  ECOG PERFORMANCE STATUS: 1  Physical Exam  Constitutional: Oriented to person, place, and time and well-developed, well-nourished, and in no distress.  HENT:  Head: Normocephalic and atraumatic.  Mouth/Throat: Oropharynx is clear and moist. No oropharyngeal exudate.  Eyes: Conjunctivae are normal. Right eye exhibits no discharge. Left eye exhibits no discharge. No scleral icterus.  Neck: Normal range of motion. Neck supple.  Cardiovascular: Normal rate,  regular rhythm, normal heart sounds and intact distal pulses.   Pulmonary/Chest: Effort normal and breath sounds normal. No respiratory distress. No wheezes. No rales.  Abdominal: Soft. Bowel sounds are normal. Exhibits no distension and no mass. There is no tenderness.  Musculoskeletal: Normal range of motion. Exhibits no edema.  Lymphadenopathy:    No cervical adenopathy.  Neurological: Alert and oriented to person, place, and time. Exhibits normal muscle tone. Gait normal. Coordination normal.  Skin: Skin is warm and dry. No rash noted. Not diaphoretic. No erythema. No pallor.  Psychiatric: Mood, memory and judgment normal.  Vitals reviewed.  LABORATORY DATA: Lab Results  Component Value Date   WBC 7.6 09/28/2023   HGB 12.7 (L) 09/28/2023   HCT 37.1 (L) 09/28/2023   MCV 91.4 09/28/2023   PLT 205 09/28/2023      Chemistry      Component Value Date/Time  NA 138 09/08/2023 0936   NA 137 06/06/2021 0919   K 4.2 09/08/2023 0936   CL 105 09/08/2023 0936   CO2 28 09/08/2023 0936   BUN 20 09/08/2023 0936   BUN 19 06/06/2021 0919   CREATININE 1.32 (H) 09/08/2023 0936   CREATININE 1.04 01/01/2016 0801      Component Value Date/Time   CALCIUM  9.1 09/08/2023 0936   ALKPHOS 80 09/08/2023 0936   AST 19 09/08/2023 0936   ALT 18 09/08/2023 0936   BILITOT 0.9 09/08/2023 0936       RADIOGRAPHIC STUDIES:  CT CHEST ABDOMEN PELVIS W CONTRAST Result Date: 09/07/2023 CLINICAL DATA:  Metastatic lung cancer restaging * Tracking Code: BO * EXAM: CT CHEST, ABDOMEN, AND PELVIS WITH CONTRAST TECHNIQUE: Multidetector CT imaging of the chest, abdomen and pelvis was performed following the standard protocol during bolus administration of intravenous contrast. RADIATION DOSE REDUCTION: This exam was performed according to the departmental dose-optimization program which includes automated exposure control, adjustment of the mA and/or kV according to patient size and/or use of iterative  reconstruction technique. CONTRAST:  OMNIPAQUE  IOHEXOL  300 MG/ML  SOLN COMPARISON:  06/25/2023 FINDINGS: CT CHEST FINDINGS Cardiovascular: Aortic atherosclerosis. Right chest port catheter. Left chest multi lead pacer defibrillator. Cardiomegaly. Three-vessel coronary artery calcifications status post median sternotomy and CABG. No pericardial effusion. Mediastinum/Nodes: No enlarged mediastinal, hilar, or axillary lymph nodes. Thyroid  gland, trachea, and esophagus demonstrate no significant findings. Lungs/Pleura: Unchanged spiculated nodule of the paramedian left upper lobe measuring 2.8 x 1.4 cm, previously 2.8 x 1.3 cm when measured with similar technique (series 4, image 45). New small nodule of the right lung base measuring 0.4 cm (series 4, image 129). Otherwise unchanged irregular scarring throughout the left-greater-than-right right lung bases with some associated small calcified nodules (series 4, image 128). No pleural effusion or pneumothorax. Musculoskeletal: No chest wall abnormality. No acute osseous findings. CT ABDOMEN PELVIS FINDINGS Hepatobiliary: No solid liver abnormality is seen. No gallstones, gallbladder wall thickening, or biliary dilatation. Pancreas: Unremarkable. No pancreatic ductal dilatation or surrounding inflammatory changes. Spleen: Normal in size without significant abnormality. Adrenals/Urinary Tract: Unchanged treated appearance of bilateral adrenal metastases no longer discretely measurable (series 2, image 61). Kidneys are normal, without renal calculi, solid lesion, or hydronephrosis. Bladder is unremarkable. Stomach/Bowel: Stomach is within normal limits. Appendix appears normal. No evidence of bowel wall thickening, distention, or inflammatory changes. Vascular/Lymphatic: Aortic atherosclerosis. No enlarged abdominal or pelvic lymph nodes. Reproductive: Prostatomegaly. Other: Bilateral inguinal hernias, containing nonobstructed sigmoid colon on the left and  nonobstructed distal small bowel on the right (series 2, image 116). No ascites. Musculoskeletal: No acute osseous findings. IMPRESSION: 1. Unchanged spiculated nodule of the paramedian left upper lobe measuring 2.8 x 1.4 cm, previously 2.8 x 1.3 cm when measured with similar technique. 2. New small nodule of the right lung base measuring 0.4 cm, nonspecific and possibly infectious or inflammatory but warranting close attention on follow-up. 3. Otherwise unchanged irregular scarring throughout the left-greater-than-right lung bases with some associated small calcified nodules. 4. Unchanged treated appearance of bilateral adrenal metastases, no longer discretely measurable. 5. No evidence of lymphadenopathy or new metastatic disease in the abdomen or pelvis. 6. Bilateral inguinal hernias, containing nonobstructed sigmoid colon on the left and nonobstructed distal small bowel on the right. 7. Prostatomegaly. 8. Coronary artery disease. Aortic Atherosclerosis (ICD10-I70.0). Electronically Signed   By: Marolyn JONETTA Jaksch M.D.   On: 09/07/2023 17:11    ASSESSMENT/PLAN:  This is a very pleasant 68 year old never smoker  Caucasian male with stage IV lung cancer, poorly differentiated carcinoma, felt to be non-small cell lung cancer. He was diagnosed in October 2024.  He presented with metabolic central left upper lobe lung mass invading the mediastinum, small hypermetabolic right hilar lymph nodes, bilateral adrenal gland masses and splenic lesion consistent metastatic disease, hypermetabolic focus in the right gluteus maximus muscle.  His molecular studies show he is MSI high    He is currently undergoing palliative immunotherapy with Keytruda  200 mg IV every 3 weeks. He is status post 13 cycles and tolerates it well.    Labs were reviewed. His CMP is pending. As long as this is within normal limits, recommend that he proceed with cycle #14 today as scheduled.    We will see him back in 3 weeks for evaluation and  repeat blood work before undergoing cycle #15  The patient also has a history of lymphoma and he did have radiation to the chest for Hodgkin's lymphoma in 2002 and chemotherapy.  He was previously followed by Dr. Timmy.    He will continue for now to have his PSA checked every 6 months or so with his urologist or PCP.   The patient was advised to call immediately if she has any concerning symptoms in the interval. The patient voices understanding of current disease status and treatment options and is in agreement with the current care plan. All questions were answered. The patient knows to call the clinic with any problems, questions or concerns. We can certainly see the patient much sooner if necessary  No orders of the defined types were placed in this encounter.    The total time spent in the appointment was 20-29 minutes  Daniela Siebers L Jaliel Deavers, PA-C 09/28/23

## 2023-09-15 DIAGNOSIS — Z1283 Encounter for screening for malignant neoplasm of skin: Secondary | ICD-10-CM | POA: Diagnosis not present

## 2023-09-15 DIAGNOSIS — L570 Actinic keratosis: Secondary | ICD-10-CM | POA: Diagnosis not present

## 2023-09-15 DIAGNOSIS — X32XXXD Exposure to sunlight, subsequent encounter: Secondary | ICD-10-CM | POA: Diagnosis not present

## 2023-09-15 DIAGNOSIS — Q828 Other specified congenital malformations of skin: Secondary | ICD-10-CM | POA: Diagnosis not present

## 2023-09-15 DIAGNOSIS — D225 Melanocytic nevi of trunk: Secondary | ICD-10-CM | POA: Diagnosis not present

## 2023-09-17 ENCOUNTER — Ambulatory Visit (HOSPITAL_COMMUNITY)
Admission: RE | Admit: 2023-09-17 | Discharge: 2023-09-17 | Disposition: A | Discharge status: Home / Self Care | Source: Ambulatory Visit | Attending: Cardiology | Admitting: Cardiology

## 2023-09-17 ENCOUNTER — Other Ambulatory Visit (HOSPITAL_COMMUNITY): Payer: Self-pay

## 2023-09-17 ENCOUNTER — Other Ambulatory Visit (HOSPITAL_BASED_OUTPATIENT_CLINIC_OR_DEPARTMENT_OTHER): Payer: Self-pay

## 2023-09-17 ENCOUNTER — Other Ambulatory Visit: Payer: Self-pay

## 2023-09-17 ENCOUNTER — Encounter (HOSPITAL_COMMUNITY): Payer: Self-pay | Admitting: Cardiology

## 2023-09-17 VITALS — BP 136/62 | HR 57 | Ht 71.0 in | Wt 184.4 lb

## 2023-09-17 DIAGNOSIS — I340 Nonrheumatic mitral (valve) insufficiency: Secondary | ICD-10-CM

## 2023-09-17 DIAGNOSIS — C3490 Malignant neoplasm of unspecified part of unspecified bronchus or lung: Secondary | ICD-10-CM | POA: Insufficient documentation

## 2023-09-17 DIAGNOSIS — I5022 Chronic systolic (congestive) heart failure: Secondary | ICD-10-CM | POA: Insufficient documentation

## 2023-09-17 DIAGNOSIS — R42 Dizziness and giddiness: Secondary | ICD-10-CM | POA: Insufficient documentation

## 2023-09-17 DIAGNOSIS — Z79899 Other long term (current) drug therapy: Secondary | ICD-10-CM | POA: Diagnosis not present

## 2023-09-17 DIAGNOSIS — Z951 Presence of aortocoronary bypass graft: Secondary | ICD-10-CM | POA: Insufficient documentation

## 2023-09-17 DIAGNOSIS — I2510 Atherosclerotic heart disease of native coronary artery without angina pectoris: Secondary | ICD-10-CM | POA: Insufficient documentation

## 2023-09-17 DIAGNOSIS — Z95810 Presence of automatic (implantable) cardiac defibrillator: Secondary | ICD-10-CM | POA: Insufficient documentation

## 2023-09-17 DIAGNOSIS — Z953 Presence of xenogenic heart valve: Secondary | ICD-10-CM | POA: Diagnosis not present

## 2023-09-17 MED ORDER — LOSARTAN POTASSIUM 25 MG PO TABS
12.5000 mg | ORAL_TABLET | Freq: Every day | ORAL | 3 refills | Status: DC
  Filled 2023-09-17: qty 45, 90d supply, fill #0

## 2023-09-17 MED ORDER — LOSARTAN POTASSIUM 25 MG PO TABS
12.5000 mg | ORAL_TABLET | Freq: Every day | ORAL | 3 refills | Status: AC
  Filled 2023-09-17: qty 45, 90d supply, fill #0
  Filled 2023-11-10 – 2023-12-12 (×2): qty 45, 90d supply, fill #1
  Filled 2024-02-16 – 2024-03-07 (×2): qty 45, 90d supply, fill #2

## 2023-09-17 NOTE — Progress Notes (Signed)
 ADVANCED HEART FAILURE FOLLOW UP CLINIC NOTE  Referring Physician: [name] LABOR, MD  Primary Care: [name] LABOR, MD Primary Cardiologist:  HPI: Tyler Hendrix is a 68 y.o. male who presents for follow up of chronic systolic heart failure.      Patient previously followed in general cardiology clinic starting around 2017.  He had a prior history of chemotherapy including anthracyclines as well as radiation of the chest wall.  He ended up requiring CABG in 2019 for his bovine mitral valve replacement.  He eventually underwent biventricular ICD placement which was complicated by difficulty placing the LV lead and has since had revision.  His ejection fraction remained low, usually around the 30 to 35% range.  Unfortunately developed stage IV lung cancer that was diagnosed in October 2024.  He has since been started on chemotherapy including Keytruda  for palliation.  Was referred to heart failure clinic given persistently low EF.     SUBJECTIVE:  He reports that overall he is doing fair.  It appears that his tumor and metastases have significantly shrunk since his last scan which he is encouraged by.  He has been taking Keytruda  but also taking some nonevidence-based medications including ivermectin.  Did have diarrhea from one of the medications that improved with stopping.  His blood pressure seemed to have dropped during that time and so has been self titrating off his goal-directed medical therapy.  From a heart failure standpoint remains NYHA class I, walking and doing push-ups regularly.  PMH, current medications, allergies, social history, and family history reviewed in epic.  PHYSICAL EXAM: Vitals:   09/17/23 1037  BP: 136/62  Pulse: (!) 57  SpO2: 98%   GENERAL: Well nourished and in no apparent distress at rest.  PULM:  Normal work of breathing, clear to auscultation bilaterally. Respirations are unlabored.  CARDIAC:  JVP: Flat         Normal rate with regular  rhythm. Systolic murmur.  No edema. Warm and well perfused extremities. ABDOMEN: Soft, non-tender, non-distended. NEUROLOGIC: Patient is oriented x3 with no focal or lateralizing neurologic deficits.    DATA REVIEW  ECG: 2025: AV dual paced rhythm with biventricular pacemaker.   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  Stable NYHA class I symptoms, has been decreasing his heart failure medications, likely as a result of him having some diarrhea.  Does report some dizziness so we will transition off ARNI to ARB.   -Chronic HFrEF well compensated on current therapy. Can continue chemotherapy -Stop Entresto  24/26, was only taking nightly 3 to 4 days a week -Start losartan  25 mg daily -Stop spironolactone , was not taking often either -Has self titrated off carvedilol , borderline bradycardia but paced -If doing well at next visit could consider low-dose metoprolol  - BiV pacing appropriately -Sent labs from cancer center reviewed   CAD s/p CABG: Mitral valve replacement as well. No further anginal symptoms. - Conitnue aspirin , statin   Stage IV lung cancer: On Keytruda , palliative but has had  good response thus far.  - Continue current therapy per oncology  Follow up in 5 months  Morene Brownie, MD Advanced Heart Failure Mechanical Circulatory Support 09/17/23

## 2023-09-17 NOTE — Patient Instructions (Addendum)
 Medication Changes:  STOP ENTRESTO    STOP SPIRONOLACTONE    START LOSARTAN  12.5MG  ONCE DAILY AT BEDTIME   Follow-Up in: 5 MONTHS WITH DR. ZENAIDA AROUND JANUARY PLEASE CALL OUR OFFICE AROUND NOVEMBER TO GET SCHEDULED FOR YOUR APPOINTMENT. PHONE NUMBER IS [phone] OPTION 2   At the [facility], you and your health needs are our priority. We have a designated team specialized in the treatment of Heart Failure. This Care Team includes your primary Heart Failure Specialized Cardiologist (physician), Advanced Practice Providers (APPs- Physician Assistants and Nurse Practitioners), and Pharmacist who all work together to provide you with the care you need, when you need it.   You may see any of the following providers on your designated Care Team at your next follow up:  Dr. Toribio Fuel Dr. Ezra Shuck Dr. Ria Commander Dr. Odis ZENAIDA Greig Mosses, NP [place]affie Shed, [place] [facility] [place], [place] Beckey Coe, NP [place] Lee, NP Tinnie Redman, PharmD   Please be sure to bring in all your medications bottles to every appointment.   Need to Contact Us :  If you have any questions or concerns before your next appointment please send us  a message through [place] or call our office at [phone].    TO LEAVE A MESSAGE FOR THE NURSE SELECT OPTION 2, PLEASE LEAVE A MESSAGE INCLUDING: YOUR NAME DATE OF BIRTH CALL BACK NUMBER REASON FOR CALL**this is important as we prioritize the call backs  YOU WILL RECEIVE A CALL BACK THE SAME DAY AS LONG AS YOU CALL BEFORE 4:00 PM

## 2023-09-24 ENCOUNTER — Other Ambulatory Visit: Payer: Self-pay

## 2023-09-24 ENCOUNTER — Other Ambulatory Visit (HOSPITAL_COMMUNITY): Payer: Self-pay

## 2023-09-24 NOTE — Progress Notes (Signed)
 Remote pacemaker transmission.

## 2023-09-24 NOTE — Addendum Note (Signed)
 Addended by: VICCI SELLER A on: 09/24/2023 11:30 AM   Modules accepted: Orders

## 2023-09-25 ENCOUNTER — Encounter: Payer: Self-pay | Admitting: Internal Medicine

## 2023-09-28 ENCOUNTER — Inpatient Hospital Stay (HOSPITAL_BASED_OUTPATIENT_CLINIC_OR_DEPARTMENT_OTHER): Admitting: Physician Assistant

## 2023-09-28 ENCOUNTER — Inpatient Hospital Stay: Attending: Physician Assistant

## 2023-09-28 ENCOUNTER — Inpatient Hospital Stay

## 2023-09-28 VITALS — BP 121/65 | HR 77 | Temp 98.2°F | Resp 18 | Ht 71.0 in | Wt 184.9 lb

## 2023-09-28 VITALS — BP 119/67 | HR 52 | Temp 97.9°F | Resp 16

## 2023-09-28 DIAGNOSIS — C3492 Malignant neoplasm of unspecified part of left bronchus or lung: Secondary | ICD-10-CM

## 2023-09-28 DIAGNOSIS — Z5112 Encounter for antineoplastic immunotherapy: Secondary | ICD-10-CM | POA: Insufficient documentation

## 2023-09-28 DIAGNOSIS — C7972 Secondary malignant neoplasm of left adrenal gland: Secondary | ICD-10-CM | POA: Insufficient documentation

## 2023-09-28 DIAGNOSIS — C7971 Secondary malignant neoplasm of right adrenal gland: Secondary | ICD-10-CM | POA: Insufficient documentation

## 2023-09-28 DIAGNOSIS — Z9221 Personal history of antineoplastic chemotherapy: Secondary | ICD-10-CM | POA: Diagnosis not present

## 2023-09-28 DIAGNOSIS — Z8571 Personal history of Hodgkin lymphoma: Secondary | ICD-10-CM | POA: Insufficient documentation

## 2023-09-28 DIAGNOSIS — C3412 Malignant neoplasm of upper lobe, left bronchus or lung: Secondary | ICD-10-CM | POA: Insufficient documentation

## 2023-09-28 DIAGNOSIS — Z95828 Presence of other vascular implants and grafts: Secondary | ICD-10-CM

## 2023-09-28 LAB — CBC WITH DIFFERENTIAL (CANCER CENTER ONLY)
Abs Immature Granulocytes: 0.01 K/uL (ref 0.00–0.07)
Basophils Absolute: 0 K/uL (ref 0.0–0.1)
Basophils Relative: 0 %
Eosinophils Absolute: 0.1 K/uL (ref 0.0–0.5)
Eosinophils Relative: 2 %
HCT: 37.1 % — ABNORMAL LOW (ref 39.0–52.0)
Hemoglobin: 12.7 g/dL — ABNORMAL LOW (ref 13.0–17.0)
Immature Granulocytes: 0 %
Lymphocytes Relative: 8 %
Lymphs Abs: 0.6 K/uL — ABNORMAL LOW (ref 0.7–4.0)
MCH: 31.3 pg (ref 26.0–34.0)
MCHC: 34.2 g/dL (ref 30.0–36.0)
MCV: 91.4 fL (ref 80.0–100.0)
Monocytes Absolute: 0.4 K/uL (ref 0.1–1.0)
Monocytes Relative: 5 %
Neutro Abs: 6.5 K/uL (ref 1.7–7.7)
Neutrophils Relative %: 85 %
Platelet Count: 205 K/uL (ref 150–400)
RBC: 4.06 MIL/uL — ABNORMAL LOW (ref 4.22–5.81)
RDW: 13.5 % (ref 11.5–15.5)
WBC Count: 7.6 K/uL (ref 4.0–10.5)
nRBC: 0 % (ref 0.0–0.2)

## 2023-09-28 LAB — CMP (CANCER CENTER ONLY)
ALT: 17 U/L (ref 0–44)
AST: 19 U/L (ref 15–41)
Albumin: 4.3 g/dL (ref 3.5–5.0)
Alkaline Phosphatase: 76 U/L (ref 38–126)
Anion gap: 6 (ref 5–15)
BUN: 20 mg/dL (ref 8–23)
CO2: 27 mmol/L (ref 22–32)
Calcium: 9 mg/dL (ref 8.9–10.3)
Chloride: 106 mmol/L (ref 98–111)
Creatinine: 1.32 mg/dL — ABNORMAL HIGH (ref 0.61–1.24)
GFR, Estimated: 59 mL/min — ABNORMAL LOW (ref >60)
Glucose, Bld: 119 mg/dL — ABNORMAL HIGH (ref 70–99)
Potassium: 4.1 mmol/L (ref 3.5–5.1)
Sodium: 139 mmol/L (ref 135–145)
Total Bilirubin: 1 mg/dL (ref 0.0–1.2)
Total Protein: 6.6 g/dL (ref 6.5–8.1)

## 2023-09-28 MED ORDER — SODIUM CHLORIDE 0.9 % IV SOLN
200.0000 mg | Freq: Once | INTRAVENOUS | Status: AC
  Administered 2023-09-28: 200 mg via INTRAVENOUS
  Filled 2023-09-28: qty 200

## 2023-09-28 MED ORDER — HEPARIN SOD (PORK) LOCK FLUSH 100 UNIT/ML IV SOLN: 500.0000 [IU] | Freq: Once | INTRAVENOUS | Status: DC | PRN

## 2023-09-28 MED ORDER — SODIUM CHLORIDE 0.9% FLUSH
10.0000 mL | INTRAVENOUS | Status: DC | PRN
  Administered 2023-09-28: 10 mL

## 2023-09-28 MED ORDER — SODIUM CHLORIDE 0.9 % IV SOLN: INTRAVENOUS | Status: DC

## 2023-09-28 MED ORDER — SODIUM CHLORIDE 0.9% FLUSH
10.0000 mL | Freq: Once | INTRAVENOUS | Status: AC
Start: 2023-09-28 — End: 2023-09-28
  Administered 2023-09-28: 10 mL

## 2023-09-28 NOTE — Patient Instructions (Signed)
 [facility] - A DEPT OF [facility]  Discharge Instructions: Thank you for choosing [facility] to provide your oncology and hematology care.   If you have a lab appointment with the Cancer Center, please go directly to the Cancer Center and check in at the registration area.   Wear comfortable clothing and clothing appropriate for easy access to any Portacath or PICC line.   We strive to give you quality time with your provider. You may need to reschedule your appointment if you arrive late (15 or more minutes).  Arriving late affects you and other patients whose appointments are after yours.  Also, if you miss three or more appointments without notifying the office, you may be dismissed from the clinic at the provider's discretion.      For prescription refill requests, have your pharmacy contact our office and allow 72 hours for refills to be completed.    Today you received the following chemotherapy and/or immunotherapy agent: Pembrolizumab (Keytruda)   To help prevent nausea and vomiting after your treatment, we encourage you to take your nausea medication as directed.  BELOW ARE SYMPTOMS THAT SHOULD BE REPORTED IMMEDIATELY: *FEVER GREATER THAN 100.4 F (38 C) OR HIGHER *CHILLS OR SWEATING *NAUSEA AND VOMITING THAT IS NOT CONTROLLED WITH YOUR NAUSEA MEDICATION *UNUSUAL SHORTNESS OF BREATH *UNUSUAL BRUISING OR BLEEDING *URINARY PROBLEMS (pain or burning when urinating, or frequent urination) *BOWEL PROBLEMS (unusual diarrhea, constipation, pain near the anus) TENDERNESS IN MOUTH AND THROAT WITH OR WITHOUT PRESENCE OF ULCERS (sore throat, sores in mouth, or a toothache) UNUSUAL RASH, SWELLING OR PAIN  UNUSUAL VAGINAL DISCHARGE OR ITCHING   Items with * indicate a potential emergency and should be followed up as soon as possible or go to the Emergency Department if any problems should occur.  Please show the CHEMOTHERAPY ALERT CARD or  IMMUNOTHERAPY ALERT CARD at check-in to the Emergency Department and triage nurse.  Should you have questions after your visit or need to cancel or reschedule your appointment, please contact [facility] - A DEPT OF [facility]  Dept: [phone]  and follow the prompts.  Office hours are 8:00 a.m. to 4:30 p.m. Monday - Friday. Please note that voicemails left after 4:00 p.m. may not be returned until the following business day.  We are closed weekends and major holidays. You have access to a nurse at all times for urgent questions. Please call the main number to the clinic Dept: [phone] and follow the prompts.   For any non-urgent questions, you may also contact your provider using MyChart. We now offer e-Visits for anyone 6 and older to request care online for non-urgent symptoms. For details visit [email or web address].   Also download the MyChart app! Go to the app store, search "MyChart", open the app, select , and log in with your MyChart username and password.

## 2023-09-29 DIAGNOSIS — R972 Elevated prostate specific antigen [PSA]: Secondary | ICD-10-CM | POA: Diagnosis not present

## 2023-10-06 ENCOUNTER — Other Ambulatory Visit (HOSPITAL_COMMUNITY): Payer: Self-pay

## 2023-10-06 DIAGNOSIS — N401 Enlarged prostate with lower urinary tract symptoms: Secondary | ICD-10-CM | POA: Diagnosis not present

## 2023-10-06 DIAGNOSIS — R3912 Poor urinary stream: Secondary | ICD-10-CM | POA: Diagnosis not present

## 2023-10-06 DIAGNOSIS — R972 Elevated prostate specific antigen [PSA]: Secondary | ICD-10-CM | POA: Diagnosis not present

## 2023-10-06 MED ORDER — TAMSULOSIN HCL 0.4 MG PO CAPS
0.4000 mg | ORAL_CAPSULE | Freq: Two times a day (BID) | ORAL | 3 refills | Status: AC
  Filled 2023-12-12 – 2024-03-08 (×4): qty 180, 90d supply, fill #0

## 2023-10-09 ENCOUNTER — Other Ambulatory Visit: Payer: Self-pay

## 2023-10-09 ENCOUNTER — Other Ambulatory Visit (HOSPITAL_COMMUNITY): Payer: Self-pay

## 2023-10-16 ENCOUNTER — Other Ambulatory Visit (HOSPITAL_COMMUNITY): Payer: Self-pay

## 2023-10-16 ENCOUNTER — Other Ambulatory Visit: Payer: Self-pay

## 2023-10-16 MED ORDER — FUROSEMIDE 40 MG PO TABS
40.0000 mg | ORAL_TABLET | Freq: Every day | ORAL | 2 refills | Status: AC
  Filled 2023-10-16: qty 30, 30d supply, fill #0
  Filled 2024-03-07: qty 60, 60d supply, fill #1
  Filled 2024-03-08: qty 30, 30d supply, fill #1

## 2023-10-19 ENCOUNTER — Inpatient Hospital Stay: Attending: Physician Assistant

## 2023-10-19 ENCOUNTER — Inpatient Hospital Stay

## 2023-10-19 ENCOUNTER — Inpatient Hospital Stay (HOSPITAL_BASED_OUTPATIENT_CLINIC_OR_DEPARTMENT_OTHER): Admitting: Internal Medicine

## 2023-10-19 VITALS — BP 137/62 | HR 82 | Temp 97.7°F | Resp 17 | Wt 185.8 lb

## 2023-10-19 DIAGNOSIS — Z8052 Family history of malignant neoplasm of bladder: Secondary | ICD-10-CM | POA: Insufficient documentation

## 2023-10-19 DIAGNOSIS — Z9221 Personal history of antineoplastic chemotherapy: Secondary | ICD-10-CM | POA: Insufficient documentation

## 2023-10-19 DIAGNOSIS — I4891 Unspecified atrial fibrillation: Secondary | ICD-10-CM | POA: Diagnosis not present

## 2023-10-19 DIAGNOSIS — Z923 Personal history of irradiation: Secondary | ICD-10-CM | POA: Insufficient documentation

## 2023-10-19 DIAGNOSIS — Z5112 Encounter for antineoplastic immunotherapy: Secondary | ICD-10-CM | POA: Insufficient documentation

## 2023-10-19 DIAGNOSIS — Z79899 Other long term (current) drug therapy: Secondary | ICD-10-CM | POA: Insufficient documentation

## 2023-10-19 DIAGNOSIS — Z8571 Personal history of Hodgkin lymphoma: Secondary | ICD-10-CM | POA: Insufficient documentation

## 2023-10-19 DIAGNOSIS — I110 Hypertensive heart disease with heart failure: Secondary | ICD-10-CM | POA: Diagnosis not present

## 2023-10-19 DIAGNOSIS — C3490 Malignant neoplasm of unspecified part of unspecified bronchus or lung: Secondary | ICD-10-CM | POA: Diagnosis not present

## 2023-10-19 DIAGNOSIS — Z800 Family history of malignant neoplasm of digestive organs: Secondary | ICD-10-CM | POA: Diagnosis not present

## 2023-10-19 DIAGNOSIS — Z7982 Long term (current) use of aspirin: Secondary | ICD-10-CM | POA: Diagnosis not present

## 2023-10-19 DIAGNOSIS — Z8042 Family history of malignant neoplasm of prostate: Secondary | ICD-10-CM | POA: Insufficient documentation

## 2023-10-19 DIAGNOSIS — C3412 Malignant neoplasm of upper lobe, left bronchus or lung: Secondary | ICD-10-CM | POA: Diagnosis not present

## 2023-10-19 DIAGNOSIS — C3492 Malignant neoplasm of unspecified part of left bronchus or lung: Secondary | ICD-10-CM

## 2023-10-19 DIAGNOSIS — C7971 Secondary malignant neoplasm of right adrenal gland: Secondary | ICD-10-CM | POA: Insufficient documentation

## 2023-10-19 DIAGNOSIS — I5042 Chronic combined systolic (congestive) and diastolic (congestive) heart failure: Secondary | ICD-10-CM | POA: Insufficient documentation

## 2023-10-19 DIAGNOSIS — C7972 Secondary malignant neoplasm of left adrenal gland: Secondary | ICD-10-CM | POA: Insufficient documentation

## 2023-10-19 DIAGNOSIS — E039 Hypothyroidism, unspecified: Secondary | ICD-10-CM | POA: Diagnosis not present

## 2023-10-19 LAB — CBC WITH DIFFERENTIAL (CANCER CENTER ONLY)
Abs Immature Granulocytes: 0.02 K/uL (ref 0.00–0.07)
Basophils Absolute: 0.1 K/uL (ref 0.0–0.1)
Basophils Relative: 1 %
Eosinophils Absolute: 0.2 K/uL (ref 0.0–0.5)
Eosinophils Relative: 3 %
HCT: 38.6 % — ABNORMAL LOW (ref 39.0–52.0)
Hemoglobin: 13.1 g/dL (ref 13.0–17.0)
Immature Granulocytes: 0 %
Lymphocytes Relative: 11 %
Lymphs Abs: 0.6 K/uL — ABNORMAL LOW (ref 0.7–4.0)
MCH: 31.2 pg (ref 26.0–34.0)
MCHC: 33.9 g/dL (ref 30.0–36.0)
MCV: 91.9 fL (ref 80.0–100.0)
Monocytes Absolute: 0.3 K/uL (ref 0.1–1.0)
Monocytes Relative: 6 %
Neutro Abs: 4.5 K/uL (ref 1.7–7.7)
Neutrophils Relative %: 79 %
Platelet Count: 237 K/uL (ref 150–400)
RBC: 4.2 MIL/uL — ABNORMAL LOW (ref 4.22–5.81)
RDW: 13.6 % (ref 11.5–15.5)
WBC Count: 5.7 K/uL (ref 4.0–10.5)
nRBC: 0 % (ref 0.0–0.2)

## 2023-10-19 LAB — CMP (CANCER CENTER ONLY)
ALT: 21 U/L (ref 0–44)
AST: 18 U/L (ref 15–41)
Albumin: 4.4 g/dL (ref 3.5–5.0)
Alkaline Phosphatase: 75 U/L (ref 38–126)
Anion gap: 6 (ref 5–15)
BUN: 15 mg/dL (ref 8–23)
CO2: 29 mmol/L (ref 22–32)
Calcium: 9.3 mg/dL (ref 8.9–10.3)
Chloride: 106 mmol/L (ref 98–111)
Creatinine: 1.19 mg/dL (ref 0.61–1.24)
GFR, Estimated: >60 mL/min (ref >60)
Glucose, Bld: 100 mg/dL — ABNORMAL HIGH (ref 70–99)
Potassium: 4.2 mmol/L (ref 3.5–5.1)
Sodium: 141 mmol/L (ref 135–145)
Total Bilirubin: 1.2 mg/dL (ref 0.0–1.2)
Total Protein: 6.8 g/dL (ref 6.5–8.1)

## 2023-10-19 LAB — TSH: TSH: 7.34 u[IU]/mL — ABNORMAL HIGH (ref 0.350–4.500)

## 2023-10-19 MED ORDER — SODIUM CHLORIDE 0.9 % IV SOLN: INTRAVENOUS | Status: DC

## 2023-10-19 MED ORDER — SODIUM CHLORIDE 0.9 % IV SOLN
200.0000 mg | Freq: Once | INTRAVENOUS | Status: AC
  Administered 2023-10-19: 200 mg via INTRAVENOUS
  Filled 2023-10-19: qty 200

## 2023-10-19 NOTE — Patient Instructions (Signed)
 [facility] - A DEPT OF [facility]  Discharge Instructions: Thank you for choosing [facility] to provide your oncology and hematology care.   If you have a lab appointment with the Cancer Center, please go directly to the Cancer Center and check in at the registration area.   Wear comfortable clothing and clothing appropriate for easy access to any Portacath or PICC line.   We strive to give you quality time with your provider. You may need to reschedule your appointment if you arrive late (15 or more minutes).  Arriving late affects you and other patients whose appointments are after yours.  Also, if you miss three or more appointments without notifying the office, you may be dismissed from the clinic at the provider's discretion.      For prescription refill requests, have your pharmacy contact our office and allow 72 hours for refills to be completed.    Today you received the following chemotherapy and/or immunotherapy agent: Pembrolizumab (Keytruda)   To help prevent nausea and vomiting after your treatment, we encourage you to take your nausea medication as directed.  BELOW ARE SYMPTOMS THAT SHOULD BE REPORTED IMMEDIATELY: *FEVER GREATER THAN 100.4 F (38 C) OR HIGHER *CHILLS OR SWEATING *NAUSEA AND VOMITING THAT IS NOT CONTROLLED WITH YOUR NAUSEA MEDICATION *UNUSUAL SHORTNESS OF BREATH *UNUSUAL BRUISING OR BLEEDING *URINARY PROBLEMS (pain or burning when urinating, or frequent urination) *BOWEL PROBLEMS (unusual diarrhea, constipation, pain near the anus) TENDERNESS IN MOUTH AND THROAT WITH OR WITHOUT PRESENCE OF ULCERS (sore throat, sores in mouth, or a toothache) UNUSUAL RASH, SWELLING OR PAIN  UNUSUAL VAGINAL DISCHARGE OR ITCHING   Items with * indicate a potential emergency and should be followed up as soon as possible or go to the Emergency Department if any problems should occur.  Please show the CHEMOTHERAPY ALERT CARD or  IMMUNOTHERAPY ALERT CARD at check-in to the Emergency Department and triage nurse.  Should you have questions after your visit or need to cancel or reschedule your appointment, please contact [facility] - A DEPT OF [facility]  Dept: [phone]  and follow the prompts.  Office hours are 8:00 a.m. to 4:30 p.m. Monday - Friday. Please note that voicemails left after 4:00 p.m. may not be returned until the following business day.  We are closed weekends and major holidays. You have access to a nurse at all times for urgent questions. Please call the main number to the clinic Dept: [phone] and follow the prompts.   For any non-urgent questions, you may also contact your provider using MyChart. We now offer e-Visits for anyone 6 and older to request care online for non-urgent symptoms. For details visit [email or web address].   Also download the MyChart app! Go to the app store, search "MyChart", open the app, select , and log in with your MyChart username and password.

## 2023-10-19 NOTE — Progress Notes (Signed)
 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 He was diagnosed in October 2024.  He presented with metabolic central left upper lobe lung mass invading the mediastinum, small hypermetabolic right hilar lymph nodes, bilateral adrenal gland masses and splenic lesion consistent metastatic disease, hypermetabolic focus in the right gluteus maximus muscle.    Molecular studies by [employer] show MSI high.  Biomarker Findings Microsatellite status - MSI-High Tumor Mutational Burden - 65 Muts/Mb HRD signature - HRDsig Negative Genomic Findings For a complete list of the genes assayed, please refer to the Appendix. PALB2 F440fs*12 KRAS G12A NF1 I679fs*21 PIK3CA P17S - subclonal, H1047R? ASXL1 G645fs*58, K618fs*1 - subclonal? MSH2 loss exons 9-16 RAC1 P29H TP53 M237I 7 Disease relevant genes with no reportable alterations: ALK, BRAF, EGFR, ERBB2, MET, RET, ROS1   PDL1 98%.   PRIOR THERAPY: None   CURRENT THERAPY: Immunotherapy with Keytruda  200 mg IV every 3 weeks.  First dose on 12/29/22.  Status post 14 cycles.  INTERVAL HISTORY: Tyler Hendrix 68 y.o. male returns to the clinic today for follow-up visit.Discussed the use of AI scribe software for clinical note transcription with the patient, who gave verbal consent to proceed.  History of Present Illness Tyler Hendrix is a 68 year old male with stage four non-small cell lung cancer who presents for evaluation before starting cycle fifteen of immunotherapy. He is accompanied by his wife.  He was diagnosed with stage four non-small cell lung cancer in October 2024, characterized by MSI high and PD-L1 expression of 98%. He is currently undergoing systemic immunotherapy with Keytruda , receiving 200 mg intravenously every three weeks. He  has completed fourteen cycles and is here for evaluation before starting cycle fifteen.  He reports no significant changes in his condition. He experienced some loose stools on one occasion, but this has resolved and is now more controlled. No chest pain, rashes, or significant breathing issues, although he mentions having 'a little more breathing trouble than normal,' which he attributes to his known heart condition.  His hemoglobin is currently 13.1, compared to previous levels of 12.3 to 12.7. He has been taking a little bit of iron, which may have contributed to this improvement.  He remains active and states that activity is good.    MEDICAL HISTORY: Past Medical History:  Diagnosis Date   Abnormal TSH    Arthritis    ddd   Atrial fibrillation and flutter (HCC)    s/p DCCV 03/26/2018   CAD in native artery    a. 11/2015: s/p 2 vessel PCI of the first diagonal branch of the LAD in the mid left circumflex with DES. // s/p CABG 01/2018   Chronic combined systolic and diastolic CHF (congestive heart failure) (HCC)    sees Dr. Kelsie    Complete heart block (HCC)    a. s/p Medtronic ppm 10/2015.   Dyspnea    with exertion   Family history of bladder cancer    Family history of prostate cancer    Family history of stomach cancer    History of radiation therapy 2002   Hodgkin disease (HCC) 2002   a. s/p chest radiation and chemotherapy   Hypertension    Hypothyroidism    sees Dr. 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  MEDICATIONS:  Current Outpatient Medications  Medication Sig Dispense Refill   aspirin  EC 81 MG tablet Take 81 mg by mouth daily. (Patient not taking: Reported on 09/17/2023)     atorvastatin  (LIPITOR ) 40 MG tablet Take 1 tablet (40 mg total) by mouth daily. (Patient taking differently: Take 1 tablet (40 mg total) by mouth daily.) 90 tablet 3   Cholecalciferol  125 MCG (5000 UT) TABS Take 1 tablet by mouth daily. 30 tablet 6   furosemide  (LASIX ) 40 MG tablet Take 1 tablet (40 mg total) by mouth daily as needed for weight gain or dyspnea 30 tablet 2   levothyroxine  (SYNTHROID ) 75 MCG tablet Take 1 tablet (75 mcg total) by mouth daily before breakfast. 90 tablet 3   losartan  (COZAAR ) 25 MG tablet Take 0.5 tablets (12.5 mg total) by mouth at bedtime. 45 tablet 3   methylPREDNISolone  (MEDROL  DOSEPAK) 4 MG TBPK tablet Use as directed on package instructions (Patient not taking: Reported on 09/17/2023) 21 tablet 0   tamsulosin  (FLOMAX ) 0.4 MG CAPS capsule Take 1 capsule (0.4 mg total) by mouth daily. 90 capsule 3   tamsulosin  (FLOMAX ) 0.4 MG CAPS capsule Take 1 capsule (0.4 mg total) by mouth 2 (two) times daily. 180 capsule 3   traZODone  (DESYREL ) 50 MG tablet Take 1 tablet (50 mg total) by mouth at bedtime as needed for sleep. 90 tablet 3   No current facility-administered medications for this visit.    SURGICAL HISTORY:  Past Surgical History:  Procedure Laterality Date   A-FLUTTER ABLATION N/A 06/20/2021   Procedure: A-FLUTTER ABLATION;  Surgeon: Waddell Danelle ORN, MD;  [facility] INVASIVE CV LAB;  Service: Cardiovascular;  Laterality: N/A;   BIV UPGRADE N/A 02/08/2019   Procedure: UPGRADE TO BIV PPM;  Surgeon: Kelsie Agent, MD;  Location: [facility] INVASIVE CV LAB;  Service: Cardiovascular;  Laterality: N/A;   BRONCHIAL BIOPSY  12/02/2022   Procedure: BRONCHIAL BIOPSIES;  Surgeon: Shelah Lamar RAMAN, MD;  Location: [facility] ENDOSCOPY;  Service: Pulmonary;;   BRONCHIAL BRUSHINGS  12/02/2022   Procedure: BRONCHIAL BRUSHINGS;  Surgeon: Shelah Lamar RAMAN, MD;  Location: [facility] ENDOSCOPY;  Service: Pulmonary;;   BRONCHIAL NEEDLE ASPIRATION BIOPSY  12/02/2022   Procedure: BRONCHIAL NEEDLE ASPIRATION BIOPSIES;  Surgeon: Shelah Lamar RAMAN, MD;  Location: [facility] ENDOSCOPY;  Service: Pulmonary;;   BRONCHIAL WASHINGS  12/02/2022   Procedure: BRONCHIAL WASHINGS;  Surgeon: Shelah Lamar RAMAN, MD;  Location: [facility] ENDOSCOPY;  Service: Pulmonary;;   CARDIAC CATHETERIZATION N/A 11/02/2015   Procedure: Left Heart Cath and Coronary Angiography;  Surgeon: Ozell Fell, MD;  Location[facility] INVASIVE CV LAB;  Service: Cardiovascular;  Laterality: N/A;   CARDIAC CATHETERIZATION N/A 11/21/2015   Procedure: Coronary Stent Intervention;  Surgeon: Ozell Fell, MD;  Location: [facility] INVASIVE CV LAB;  Service: Cardiovascular;  Laterality: N/A;   CARDIAC CATHETERIZATION  11/2017   CARDIOVERSION N/A 03/26/2018  Procedure: CARDIOVERSION;  Surgeon: Loni Soyla LABOR, MD;  Location: [facility] ENDOSCOPY;  Service: Cardiovascular;  Laterality: N/A;   COLONOSCOPY  01/16/2015   per Dr. Teressa, benign polyps, repeat in 10 yrs    CORONARY ARTERY BYPASS GRAFT N/A 01/27/2018   Procedure: CORONARY ARTERY BYPASS GRAFTING (CABG) x two, using left internal mammary artery and right leg greater saphenous vein harvested endoscopically;  Surgeon: Dusty Sudie DEL, MD;  Location: [facility] OR;  Service: Open Heart Surgery;  Laterality: N/A;   DIRECT LARYNGOSCOPY Bilateral 04/20/2023   Procedure: LARYNGOSCOPY, MICRO DIRECT WITH PROLARYN INJECTION;  Surgeon: Carlie Clark, MD;  Location: [facility] OR;  Service: ENT;  Laterality: Bilateral;   EP IMPLANTABLE DEVICE N/A 11/02/2015   MDT Adivisa MRI conditional dual chamber pacemaker implanted by Dr Kelsie for complete heart block   FEMUR IM NAIL Left 07/01/2022   Procedure: INTRAMEDULLARY (IM) NAIL FEMORAL LEFT ANTEGRADE;  Surgeon: Celena Sharper, MD;  Location: [facility] OR;  Service: Orthopedics;  Laterality: Left;   IR IMAGING GUIDED PORT INSERTION  01/02/2023   KNEE SURGERY Right 1972   LEAD EXTRACTION N/A 10/20/2022   Procedure: LEAD EXTRACTION;  Surgeon: Waddell Danelle ORN, MD;  Location: [facility] INVASIVE CV LAB;  Service: Cardiovascular;  Laterality: N/A;   LUMBAR LAMINECTOMY  1996   MITRAL VALVE REPLACEMENT N/A 01/27/2018   Procedure: MITRAL VALVE (MV) REPLACEMENT using Magna Mitral Ease Valve Size ;  Surgeon: Dusty Sudie DEL, MD;  Location: [facility] OR;  Service: Open Heart Surgery;  Laterality: N/A;   RIGHT/LEFT HEART CATH AND CORONARY ANGIOGRAPHY N/A 12/02/2017   Procedure: RIGHT/LEFT HEART CATH AND CORONARY ANGIOGRAPHY;  Surgeon: Dann Candyce RAMAN, MD;  Location: [facility] INVASIVE CV LAB;  Service: Cardiovascular;  Laterality: N/A;   TEE WITHOUT CARDIOVERSION N/A 12/09/2017   Procedure: TRANSESOPHAGEAL ECHOCARDIOGRAM (TEE);  Surgeon: Lonni Slain, MD;  Location: [facility] ENDOSCOPY;  Service: Cardiovascular;  Laterality: N/A;   TEE WITHOUT CARDIOVERSION N/A 01/27/2018   Procedure: TRANSESOPHAGEAL ECHOCARDIOGRAM (TEE);  Surgeon: Dusty Sudie DEL, MD;  Location: [facility] OR;  Service: Open Heart Surgery;  Laterality: N/A;   VIDEO BRONCHOSCOPY WITH RADIAL ENDOBRONCHIAL ULTRASOUND  12/02/2022   Procedure: VIDEO BRONCHOSCOPY WITH RADIAL ENDOBRONCHIAL ULTRASOUND;  Surgeon: Shelah Lamar RAMAN, MD;  Location: [facility] ENDOSCOPY;  Service: Pulmonary;;    REVIEW OF SYSTEMS:  A comprehensive review of systems was negative.   PHYSICAL EXAMINATION: General appearance: alert, cooperative, and no distress Head: Normocephalic, without obvious abnormality, atraumatic Neck: no adenopathy, no JVD, supple, symmetrical, trachea midline, and thyroid  not enlarged, symmetric, no tenderness/mass/nodules Lymph nodes: Cervical, supraclavicular, and axillary nodes normal. Resp: clear to auscultation bilaterally Back: symmetric, no curvature. ROM normal. No CVA  tenderness. Cardio: regular rate and rhythm, S1, S2 normal, no murmur, click, rub or gallop GI: soft, non-tender; bowel sounds normal; no masses,  no organomegaly Extremities: extremities normal, atraumatic, no cyanosis or edema  ECOG PERFORMANCE STATUS: 1 - Symptomatic but completely ambulatory  Blood pressure 137/62, pulse 82, temperature 97.7 F (36.5 C), resp. rate 17, weight 185 lb 12.8 oz (84.3 kg), SpO2 99%.  LABORATORY DATA: Lab Results  Component Value Date   WBC 7.6 09/28/2023   HGB 12.7 (L) 09/28/2023   HCT 37.1 (L) 09/28/2023   MCV 91.4 09/28/2023   PLT 205 09/28/2023      Chemistry      Component Value Date/Time   NA 139 09/28/2023 0742   NA 137 06/06/2021 0919   K 4.1 09/28/2023 0742   CL 106 09/28/2023 0742   CO2  27 09/28/2023 0742   BUN 20 09/28/2023 0742   BUN 19 06/06/2021 0919   CREATININE 1.32 (H) 09/28/2023 0742   CREATININE 1.04 01/01/2016 0801      Component Value Date/Time   CALCIUM  9.0 09/28/2023 0742   ALKPHOS 76 09/28/2023 0742   AST 19 09/28/2023 0742   ALT 17 09/28/2023 0742   BILITOT 1.0 09/28/2023 0742       RADIOGRAPHIC STUDIES: No results found.   ASSESSMENT AND PLAN: This is a very pleasant 68 years old white male with stage IV lung cancer, felt to be poorly differentiated NSCLC. He was diagnosed in October 2024.  He presented with metabolic central left upper lobe lung mass invading the mediastinum, small hypermetabolic right hilar lymph nodes, bilateral adrenal gland masses and splenic lesion consistent metastatic disease, hypermetabolic focus in the right gluteus maximus muscle.  His molecular studies by [employer] showed MSI high and PD-L1 expression of 98%.  The patient is currently on treatment with immunotherapy with single agent Keytruda  200 Mg IV every 3 weeks status post 15 cycles. He has been tolerating this treatment fairly well. Assessment and Plan Assessment & Plan Stage 4 non-small cell lung cancer Stage 4  non-small cell lung cancer with MSI high and PD-L1 expression of 98%. Currently on Keytruda  200 mg IV every three weeks, post 14 cycles. No significant changes reported. Loose stools resolved. No chest pain, rash, or significant breathing issues, although some increased breathing difficulty attributed to cardiac issues. Hemoglobin improved to 13.1, above normal range. - Administer Keytruda  200 mg IV for cycle 15. - Order scan before next visit. - Consider extending scan interval to every four cycles if next scan is favorable.  Resolved anemia Previously low hemoglobin levels have improved. Current hemoglobin is 13.1, which is above the normal range. Improvement noted since starting iron supplementation. The patient was advised to call immediately if he has any other concerning symptoms in the interval.  The patient voices understanding of current disease status and treatment options and is in agreement with the current care plan.  All questions were answered. The patient knows to call the clinic with any problems, questions or concerns. We can certainly see the patient much sooner if necessary.  Disclaimer: This note was dictated with voice recognition software. Similar sounding words can inadvertently be transcribed and may not be corrected upon review.

## 2023-10-20 ENCOUNTER — Ambulatory Visit: Payer: 59

## 2023-10-20 DIAGNOSIS — I442 Atrioventricular block, complete: Secondary | ICD-10-CM | POA: Diagnosis not present

## 2023-10-20 LAB — T4: T4, Total: 6.4 ug/dL (ref 4.5–12.0)

## 2023-10-21 ENCOUNTER — Encounter: Payer: Self-pay | Admitting: Internal Medicine

## 2023-10-21 LAB — CUP PACEART REMOTE DEVICE CHECK
Battery Remaining Longevity: 104 mo
Battery Voltage: 3.01 V
Brady Statistic AP VP Percent: 79.38 %
Brady Statistic AP VS Percent: 0.1 %
Brady Statistic AS VP Percent: 18.69 %
Brady Statistic AS VS Percent: 1.83 %
Brady Statistic RA Percent Paced: 80.6 %
Brady Statistic RV Percent Paced: 98.06 %
Date Time Interrogation Session: 20250909025842
Implantable Lead Connection Status: 753985
Implantable Lead Connection Status: 753985
Implantable Lead Connection Status: 753985
Implantable Lead Implant Date: 20170922
Implantable Lead Implant Date: 20170922
Implantable Lead Implant Date: 20240909
Implantable Lead Location: 753859
Implantable Lead Location: 753860
Implantable Lead Location: 753860
Implantable Lead Model: 3830
Implantable Lead Model: 5076
Implantable Lead Model: 5076
Implantable Pulse Generator Implant Date: 20240909
Lead Channel Impedance Value: 2831 Ohm
Lead Channel Impedance Value: 285 Ohm
Lead Channel Impedance Value: 304 Ohm
Lead Channel Impedance Value: 323 Ohm
Lead Channel Impedance Value: 418 Ohm
Lead Channel Impedance Value: 418 Ohm
Lead Channel Impedance Value: 437 Ohm
Lead Channel Impedance Value: 570 Ohm
Lead Channel Impedance Value: 646 Ohm
Lead Channel Pacing Threshold Amplitude: 0.5 V
Lead Channel Pacing Threshold Amplitude: 0.875 V
Lead Channel Pacing Threshold Amplitude: 1 V
Lead Channel Pacing Threshold Pulse Width: 0.4 ms
Lead Channel Pacing Threshold Pulse Width: 0.4 ms
Lead Channel Pacing Threshold Pulse Width: 0.4 ms
Lead Channel Sensing Intrinsic Amplitude: 3.125 mV
Lead Channel Sensing Intrinsic Amplitude: 3.125 mV
Lead Channel Sensing Intrinsic Amplitude: 3.25 mV
Lead Channel Sensing Intrinsic Amplitude: 3.25 mV
Lead Channel Setting Pacing Amplitude: 1.5 V
Lead Channel Setting Pacing Amplitude: 1.5 V
Lead Channel Setting Pacing Amplitude: 2.5 V
Lead Channel Setting Pacing Pulse Width: 0.4 ms
Lead Channel Setting Pacing Pulse Width: 0.4 ms
Lead Channel Setting Sensing Sensitivity: 2.8 mV
Zone Setting Status: 755011
Zone Setting Status: 755011

## 2023-10-27 ENCOUNTER — Ambulatory Visit: Payer: Self-pay | Admitting: Internal Medicine

## 2023-10-30 NOTE — Progress Notes (Signed)
 Remote PPM Transmission

## 2023-11-03 ENCOUNTER — Ambulatory Visit (HOSPITAL_COMMUNITY)
Admission: RE | Admit: 2023-11-03 | Discharge: 2023-11-03 | Disposition: A | Discharge status: Home / Self Care | Source: Ambulatory Visit | Attending: Internal Medicine | Admitting: Internal Medicine

## 2023-11-03 DIAGNOSIS — C3490 Malignant neoplasm of unspecified part of unspecified bronchus or lung: Secondary | ICD-10-CM | POA: Diagnosis not present

## 2023-11-03 DIAGNOSIS — K4020 Bilateral inguinal hernia, without obstruction or gangrene, not specified as recurrent: Secondary | ICD-10-CM | POA: Diagnosis not present

## 2023-11-03 DIAGNOSIS — I700 Atherosclerosis of aorta: Secondary | ICD-10-CM | POA: Diagnosis not present

## 2023-11-03 MED ORDER — HEPARIN SOD (PORK) LOCK FLUSH 100 UNIT/ML IV SOLN
INTRAVENOUS | Status: AC
  Filled 2023-11-03: qty 5

## 2023-11-03 MED ORDER — IOHEXOL 300 MG/ML  SOLN
100.0000 mL | Freq: Once | INTRAMUSCULAR | Status: AC | PRN
  Administered 2023-11-03: 100 mL via INTRAVENOUS

## 2023-11-03 MED ORDER — HEPARIN SOD (PORK) LOCK FLUSH 100 UNIT/ML IV SOLN
500.0000 [IU] | Freq: Once | INTRAVENOUS | Status: AC
  Administered 2023-11-03: 500 [IU] via INTRAVENOUS

## 2023-11-04 NOTE — Progress Notes (Signed)
 [facility] OFFICE PROGRESS NOTE  Tyler Hendrix LABOR, MD [address] 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 He was diagnosed in October 2024.  He presented with metabolic central left upper lobe lung mass invading the mediastinum, small hypermetabolic right hilar lymph nodes, bilateral adrenal gland masses and splenic lesion consistent metastatic disease, hypermetabolic focus in the right gluteus maximus muscle.    Molecular studies by [employer] show MSI high.    Foundation One requested on 12/22/22   PDL1: 98%  PRIOR THERAPY: None  CURRENT THERAPY:  Immunotherapy with Keytruda  200 mg IV every 3 weeks.  First dose expected on 12/29/22. Status post 13 cycles.   INTERVAL HISTORY: Tyler Hendrix 68 y.o. male returns to the clinic today for a follow-up visit.  The patient was last seen in the clinic by myself and Dr. Sherrod 3 weeks ago.  He is feeling well today without any concerning complaints.    She started on treatment with immunotherapy with Keytruda . He has been tolerating treatment well. He may be a little more fatigue. He had one headache that was self limiting after his last treatment but otherwise denies frequent or significant headaches.    Denies any fever, chills, or night sweats. Today, he states his breathing is stable. He denies unusual cough. He denies any chest pain.  He denies any hemoptysis. He denies any nausea, vomiting, or constipation. He previously was having diarrhea but was able to identify the cause and has since discontinued a supplement he had been taking and his diarrhea improved. Denies any headaches. He denies rashes or itching.  He recently had a restaging CT scan performed. He is here for evaluation and repeat blood work before undergoing cycle #16.   MEDICAL HISTORY: Past Medical History:  Diagnosis Date   Abnormal TSH    Arthritis    ddd   Atrial fibrillation and  flutter (HCC)    s/p DCCV 03/26/2018   CAD in native artery    a. 11/2015: s/p 2 vessel PCI of the first diagonal branch of the LAD in the mid left circumflex with DES. // s/p CABG 01/2018   Chronic combined systolic and diastolic CHF (congestive heart failure) (HCC)    sees Dr. Kelsie    Complete heart block (HCC)    a. s/p Medtronic ppm 10/2015.   Dyspnea    with exertion   Family history of bladder cancer    Family history of prostate cancer    Family history of stomach cancer    History of radiation therapy 2002   Hodgkin disease (HCC) 2002   a. s/p chest radiation and chemotherapy   Hypertension    Hypothyroidism    sees Dr. Trixie    Incidental pulmonary nodule 01/19/2018   Left apical opacity - possible scarring   Inguinal hernia 10/14/2022   bilateral   Mitral regurgitation    s/p bioprosthetic MV replacement 01/2018   Non-small cell lung cancer (NSCLC) (HCC) 11/2022   Pacemaker 10/2015   Medtronic   PVC's (premature ventricular contractions)    Right bundle branch block    S/P CABG x 2 01/27/2018   LIMA to LAD, SVG to RCA, EVH via right thigh   S/P mitral valve replacement with bioprosthetic valve 01/27/2018   29 mm [facility] Mitral stented bovine pericardial tissue valve    ALLERGIES:  is allergic to other.  MEDICATIONS:  Current Outpatient Medications  Medication  Sig Dispense Refill   aspirin  EC 81 MG tablet Take 81 mg by mouth daily.     atorvastatin  (LIPITOR ) 40 MG tablet Take 1 tablet (40 mg total) by mouth daily. (Patient taking differently: Take 1 tablet (40 mg total) by mouth daily.) 90 tablet 3   Cholecalciferol  125 MCG (5000 UT) TABS Take 1 tablet by mouth daily. 30 tablet 6   furosemide  (LASIX ) 40 MG tablet Take 1 tablet (40 mg total) by mouth daily as needed for weight gain or dyspnea 30 tablet 2   levothyroxine  (SYNTHROID ) 75 MCG tablet Take 1 tablet (75 mcg total) by mouth daily before breakfast. 90 tablet 3   losartan  (COZAAR ) 25 MG tablet Take  0.5 tablets (12.5 mg total) by mouth at bedtime. 45 tablet 3   tamsulosin  (FLOMAX ) 0.4 MG CAPS capsule Take 1 capsule (0.4 mg total) by mouth daily. 90 capsule 3   tamsulosin  (FLOMAX ) 0.4 MG CAPS capsule Take 1 capsule (0.4 mg total) by mouth 2 (two) times daily. 180 capsule 3   traZODone  (DESYREL ) 50 MG tablet Take 1 tablet (50 mg total) by mouth at bedtime as needed for sleep. 90 tablet 3   No current facility-administered medications for this visit.    SURGICAL HISTORY:  Past Surgical History:  Procedure Laterality Date   A-FLUTTER ABLATION N/A 06/20/2021   Procedure: A-FLUTTER ABLATION;  Surgeon: Waddell Danelle ORN, MD;  Location: [facility] INVASIVE CV LAB;  Service: Cardiovascular;  Laterality: N/A;   BIV UPGRADE N/A 02/08/2019   Procedure: UPGRADE TO BIV PPM;  Surgeon: Kelsie Agent, MD;  Location: [facility] INVASIVE CV LAB;  Service: Cardiovascular;  Laterality: N/A;   BRONCHIAL BIOPSY  12/02/2022   Procedure: BRONCHIAL BIOPSIES;  Surgeon: Shelah Lamar RAMAN, MD;  Location: [facility] ENDOSCOPY;  Service: Pulmonary;;   BRONCHIAL BRUSHINGS  12/02/2022   Procedure: BRONCHIAL BRUSHINGS;  Surgeon: Shelah Lamar RAMAN, MD;  Location: [facility] ENDOSCOPY;  Service: Pulmonary;;   BRONCHIAL NEEDLE ASPIRATION BIOPSY  12/02/2022   Procedure: BRONCHIAL NEEDLE ASPIRATION BIOPSIES;  Surgeon: Shelah Lamar RAMAN, MD;  Location: [facility] ENDOSCOPY;  Service: Pulmonary;;   BRONCHIAL WASHINGS  12/02/2022   Procedure: BRONCHIAL WASHINGS;  Surgeon: Shelah Lamar RAMAN, MD;  Location: [facility] ENDOSCOPY;  Service: Pulmonary;;   CARDIAC CATHETERIZATION N/A 11/02/2015   Procedure: Left Heart Cath and Coronary Angiography;  Surgeon: Ozell Fell, MD;  Location: [facility] INVASIVE CV LAB;  Service: Cardiovascular;  Laterality: N/A;   CARDIAC CATHETERIZATION N/A 11/21/2015   Procedure: Coronary Stent Intervention;  Surgeon: Ozell Fell, MD;  Location: [facility] INVASIVE CV LAB;  Service: Cardiovascular;  Laterality: N/A;   CARDIAC CATHETERIZATION  11/2017   CARDIOVERSION N/A  03/26/2018   Procedure: CARDIOVERSION;  Surgeon: Loni Soyla LABOR, MD;  Location: [facility] ENDOSCOPY;  Service: Cardiovascular;  Laterality: N/A;   COLONOSCOPY  01/16/2015   per Dr. Teressa, benign polyps, repeat in 10 yrs    CORONARY ARTERY BYPASS GRAFT N/A 01/27/2018   Procedure: CORONARY ARTERY BYPASS GRAFTING (CABG) x two, using left internal mammary artery and right leg greater saphenous vein harvested endoscopically;  Surgeon: Dusty Sudie DEL, MD;  Location: [facility] OR;  Service: Open Heart Surgery;  Laterality: N/A;   DIRECT LARYNGOSCOPY Bilateral 04/20/2023   Procedure: LARYNGOSCOPY, MICRO DIRECT WITH PROLARYN INJECTION;  Surgeon: Carlie Clark, MD;  Location: [facility] OR;  Service: ENT;  Laterality: Bilateral;   EP IMPLANTABLE DEVICE N/A 11/02/2015   MDT Adivisa MRI conditional dual chamber pacemaker implanted by Dr Kelsie for complete heart block   FEMUR IM NAIL  Left 07/01/2022   Procedure: INTRAMEDULLARY (IM) NAIL FEMORAL LEFT ANTEGRADE;  Surgeon: Celena Sharper, MD;  Location: [facility] OR;  Service: Orthopedics;  Laterality: Left;   IR IMAGING GUIDED PORT INSERTION  01/02/2023   KNEE SURGERY Right 1972   LEAD EXTRACTION N/A 10/20/2022   Procedure: LEAD EXTRACTION;  Surgeon: Waddell Danelle ORN, MD;  Location: [facility] INVASIVE CV LAB;  Service: Cardiovascular;  Laterality: N/A;   LUMBAR LAMINECTOMY  1996   MITRAL VALVE REPLACEMENT N/A 01/27/2018   Procedure: MITRAL VALVE (MV) REPLACEMENT using Magna Mitral Ease Valve Size ;  Surgeon: Dusty Sudie DEL, MD;  Location: [facility] OR;  Service: Open Heart Surgery;  Laterality: N/A;   RIGHT/LEFT HEART CATH AND CORONARY ANGIOGRAPHY N/A 12/02/2017   Procedure: RIGHT/LEFT HEART CATH AND CORONARY ANGIOGRAPHY;  Surgeon: Dann Candyce RAMAN, MD;  Location: [facility] INVASIVE CV LAB;  Service: Cardiovascular;  Laterality: N/A;   TEE WITHOUT CARDIOVERSION N/A 12/09/2017   Procedure: TRANSESOPHAGEAL ECHOCARDIOGRAM (TEE);  Surgeon: Lonni Slain, MD;  Location: [facility] ENDOSCOPY;  Service:  Cardiovascular;  Laterality: N/A;   TEE WITHOUT CARDIOVERSION N/A 01/27/2018   Procedure: TRANSESOPHAGEAL ECHOCARDIOGRAM (TEE);  Surgeon: Dusty Sudie DEL, MD;  Location: [facility] OR;  Service: Open Heart Surgery;  Laterality: N/A;   VIDEO BRONCHOSCOPY WITH RADIAL ENDOBRONCHIAL ULTRASOUND  12/02/2022   Procedure: VIDEO BRONCHOSCOPY WITH RADIAL ENDOBRONCHIAL ULTRASOUND;  Surgeon: Shelah Lamar RAMAN, MD;  Location: [facility] ENDOSCOPY;  Service: Pulmonary;;    REVIEW OF SYSTEMS:   Review of Systems  Constitutional: Negative for appetite change, chills, fatigue, fever and unexpected weight change.  HENT:   Negative for mouth sores, nosebleeds, sore throat and trouble swallowing.   Eyes: Negative for eye problems and icterus.  Respiratory: Negative for cough, hemoptysis, shortness of breath and wheezing.   Cardiovascular: Negative for chest pain and leg swelling.  Gastrointestinal: Negative for abdominal pain, constipation, diarrhea, nausea and vomiting.  Genitourinary: Negative for bladder incontinence, difficulty urinating, dysuria, frequency and hematuria.   Musculoskeletal: Negative for back pain, gait problem, neck pain and neck stiffness.  Skin: Negative for itching and rash.  Neurological: Negative for dizziness, extremity weakness, gait problem, headaches, light-headedness and seizures.  Hematological: Negative for adenopathy. Does not bruise/bleed easily.  Psychiatric/Behavioral: Negative for confusion, depression and sleep disturbance. The patient is not nervous/anxious.     PHYSICAL EXAMINATION:  Blood pressure 114/62, pulse 78, temperature 98.1 F (36.7 C), temperature source Temporal, resp. rate 17, height 5' 11 (1.803 m), weight 186 lb (84.4 kg), SpO2 100%.  ECOG PERFORMANCE STATUS: 1  Physical Exam  Constitutional: Oriented to person, place, and time and well-developed, well-nourished, and in no distress.  HENT:  Head: Normocephalic and atraumatic.  Mouth/Throat: Oropharynx is clear and  moist. No oropharyngeal exudate.  Eyes: Conjunctivae are normal. Right eye exhibits no discharge. Left eye exhibits no discharge. No scleral icterus.  Neck: Normal range of motion. Neck supple.  Cardiovascular: Normal rate, regular rhythm, normal heart sounds and intact distal pulses.   Pulmonary/Chest: Effort normal and breath sounds normal. No respiratory distress. No wheezes. No rales.  Abdominal: Soft. Bowel sounds are normal. Exhibits no distension and no mass. There is no tenderness.  Musculoskeletal: Normal range of motion. Exhibits no edema.  Lymphadenopathy:    No cervical adenopathy.  Neurological: Alert and oriented to person, place, and time. Exhibits normal muscle tone. Gait normal. Coordination normal.  Skin: Skin is warm and dry. No rash noted. Not diaphoretic. No erythema. No pallor.  Psychiatric: Mood, memory and judgment normal.  Vitals  reviewed.  LABORATORY DATA: Lab Results  Component Value Date   WBC 6.8 11/10/2023   HGB 13.6 11/10/2023   HCT 39.4 11/10/2023   MCV 92.5 11/10/2023   PLT 234 11/10/2023      Chemistry      Component Value Date/Time   NA 141 10/19/2023 0831   NA 137 06/06/2021 0919   K 4.2 10/19/2023 0831   CL 106 10/19/2023 0831   CO2 29 10/19/2023 0831   BUN 15 10/19/2023 0831   BUN 19 06/06/2021 0919   CREATININE 1.19 10/19/2023 0831   CREATININE 1.04 01/01/2016 0801      Component Value Date/Time   CALCIUM  9.3 10/19/2023 0831   ALKPHOS 75 10/19/2023 0831   AST 18 10/19/2023 0831   ALT 21 10/19/2023 0831   BILITOT 1.2 10/19/2023 0831       RADIOGRAPHIC STUDIES:  CT CHEST ABDOMEN PELVIS W CONTRAST Result Date: 11/04/2023 CLINICAL DATA:  Non-small-cell lung cancer, staging. * Tracking Code: BO * EXAM: CT CHEST, ABDOMEN, AND PELVIS WITH CONTRAST TECHNIQUE: Multidetector CT imaging of the chest, abdomen and pelvis was performed following the standard protocol during bolus administration of intravenous contrast. RADIATION DOSE  REDUCTION: This exam was performed according to the departmental dose-optimization program which includes automated exposure control, adjustment of the mA and/or kV according to patient size and/or use of iterative reconstruction technique. CONTRAST:  OMNIPAQUE  IOHEXOL  300 MG/ML  SOLN COMPARISON:  Multiple priors including CT September 02, 2023 FINDINGS: CT CHEST FINDINGS Cardiovascular: Accessed right chest Port-A-Cath with tip in the right atrium. Left chest cardiac generator with unchanged position of the leads. Four-chamber cardiac enlargement. Normal caliber thoracic aorta. Aortic atherosclerosis. Mediastinum/Nodes: No suspicious thyroid  nodule. No pathologically enlarged mediastinal, hilar or axillary lymph nodes. Similar soft tissue thickening along the left hilum. Lungs/Pleura: Decreased size of the spiculated left upper lobe paramediastinal pulmonary nodule on image 50/4 measuring 2.2 x 1.3 cm previously 2.8 x 1.4 cm. Pulmonary nodule in the right lung base which was new on prior examination not seen today. Similar irregular scarring in the left-greater-than-right lung bases with associated small calcified and noncalcified nodules. Elevation of left hemidiaphragm. No pleural effusion.  No pneumothorax. Musculoskeletal: No aggressive lytic or blastic lesion of bone. Prior median sternotomy. CT ABDOMEN PELVIS FINDINGS Hepatobiliary: No suspicious hepatic lesion. Gallbladder is unremarkable. No biliary ductal dilation. Pancreas: No pancreatic ductal dilation or evidence of acute inflammation. Spleen: No splenomegaly or focal splenic lesion. Adrenals/Urinary Tract: Similar posttreatment appearance of bilateral adrenal metastasis common no longer discretely measurable. No hydronephrosis.  No renal, ureteral or bladder calculi. Stomach/Bowel: Stomach is unremarkable for degree of distension. No pathologic dilation of small or large bowel. Vascular/Lymphatic: Aortic atherosclerosis. Normal caliber abdominal  aorta. Smooth IVC contours. The portal, splenic and superior mesenteric veins are patent. Increased size of a celiac axis lymph node now measuring 11 mm in short axis on image 61/2 previously 8 mm, this has been waxing and waning in size over multiple prior examinations and was not hypermetabolic on PET-CT December 08, 2022. Reproductive: Prostate gland is enlarged. Other: Bilateral inguinal hernias containing fat and nonobstructed portions of bowel. Musculoskeletal: No aggressive lytic or blastic lesion of bone. Partially visualized left femoral fixation hardware. Degenerative change of the bilateral hips. Lumbar spondylosis. IMPRESSION: 1. Decreased size of the spiculated left upper lobe paramediastinal pulmonary nodule. 2. Similar posttreatment appearance of bilateral adrenal metastasis, no longer discretely measurable. 3. Increased size of a celiac axis lymph node now measuring 11 mm in  short axis previously 8 mm, this has been waxing and waning in size over multiple prior examinations and was not hypermetabolic on PET-CT December 08, 2022. Nonspecific and favored reactive. Suggest continued attention on follow-up imaging. 4. Pulmonary nodule in the right lung base which was new on prior examination not seen today. 5. Similar irregular scarring in the left-greater-than-right lung bases with associated small calcified and noncalcified nodules. Suggest continued attention on follow-up imaging. 6. Bilateral inguinal hernias containing fat and nonobstructed portions of bowel. Electronically Signed   By: Reyes Holder M.D.   On: 11/04/2023 16:41   CUP PACEART REMOTE DEVICE CHECK Result Date: 10/21/2023 PPM Scheduled remote reviewed. Normal device function.  Presenting rhythm: AS-AP/BiVP. 10 VHR episodes, longest 4 sec on 08/21/23 at 20:33, EGMs c/w NSVT, avg V rates 158-194 bpm. HF diagnostics currently abnormal. Next remote 91 days. [facility], [facility]    ASSESSMENT/PLAN:  This is a very pleasant 68 year old never smoker  Caucasian male with stage IV lung cancer, poorly differentiated carcinoma, felt to be non-small cell lung cancer. He was diagnosed in October 2024.  He presented with metabolic central left upper lobe lung mass invading the mediastinum, small hypermetabolic right hilar lymph nodes, bilateral adrenal gland masses and splenic lesion consistent metastatic disease, hypermetabolic focus in the right gluteus maximus muscle.  His molecular studies show he is MSI high    He is currently undergoing palliative immunotherapy with Keytruda  200 mg IV every 3 weeks. He is status post 15 cycles and tolerates it well.   The patient was seen with Dr. Sherrod today.  Dr. Sherrod personally and independently reviewed the scan and discussed results with the patient today.  The scan showed no evidence of progression except for enlarging celiac axis lymph node now measuring 11 mm. We will arrange for a PET scan to further evaluate this.   Labs were reviewed. His CMP is pending. As long as this is within normal limits, recommend that he proceed with cycle #15 today as scheduled.    We will see him back in 3 weeks for evaluation and repeat blood work before undergoing cycle #16   The patient also has a history of lymphoma and he did have radiation to the chest for Hodgkin's lymphoma in 2002 and chemotherapy.  He was previously followed by Dr. Timmy.    He will continue for now to have his PSA checked every 6 months or so with his urologist or PCP.     The patient was advised to call immediately if he has any concerning symptoms in the interval. The patient voices understanding of current disease status and treatment options and is in agreement with the current care plan. All questions were answered. The patient knows to call the clinic with any problems, questions or concerns. We can certainly see the patient much sooner if necessary   No orders of the defined types were placed in this encounter.    Temiloluwa Laredo L  Ismelda Weatherman, PA-C 11/10/23  ADDENDUM: Hematology/Oncology Attending:  I had a face-to-face encounter with the patient today.  I reviewed his record, lab, scan and recommended his care plan.  This is a very pleasant 68 years old white male with stage IV non-small cell lung cancer diagnosed in October 2024 with no actionable mutation except for MSI high and PD-L1 expression of 98%.  The patient started treatment with single agent Keytruda  200 mg IV every 3 weeks status post 14 cycles.  He has been tolerating this treatment well with no significant  adverse effects.  He had repeat CT scan of the chest, abdomen and pelvis performed recently.  I personally and independently reviewed the scan and discussed the result with the patient and his wife.  His scan showed further improvement of his disease but there was slight increase in size of celiac axis lymph node.  The patient was worried about this lesion and he would like to have a PET scan performed for further evaluation of his disease. Will arrange for the patient to have a PET scan before his upcoming visit but he will continue with his current treatment with Keytruda  every 3 weeks and he will proceed with cycle #15 today. He was advised to call immediately if he has any other concerning symptoms in the interval. The total time spent in the appointment was 30 minutes including review of chart and various tests results, discussions about plan of care and coordination of care plan . Disclaimer: This note was dictated with voice recognition software. Similar sounding words can inadvertently be transcribed and may be missed upon review. Sherrod MARLA Sherrod, MD

## 2023-11-10 ENCOUNTER — Inpatient Hospital Stay: Admitting: Physician Assistant

## 2023-11-10 ENCOUNTER — Other Ambulatory Visit: Payer: Self-pay

## 2023-11-10 ENCOUNTER — Inpatient Hospital Stay

## 2023-11-10 ENCOUNTER — Other Ambulatory Visit (HOSPITAL_COMMUNITY): Payer: Self-pay

## 2023-11-10 VITALS — BP 114/62 | HR 78 | Temp 98.1°F | Resp 17 | Ht 71.0 in | Wt 186.0 lb

## 2023-11-10 DIAGNOSIS — E039 Hypothyroidism, unspecified: Secondary | ICD-10-CM | POA: Diagnosis not present

## 2023-11-10 DIAGNOSIS — C3412 Malignant neoplasm of upper lobe, left bronchus or lung: Secondary | ICD-10-CM | POA: Diagnosis not present

## 2023-11-10 DIAGNOSIS — C7971 Secondary malignant neoplasm of right adrenal gland: Secondary | ICD-10-CM | POA: Diagnosis not present

## 2023-11-10 DIAGNOSIS — C3492 Malignant neoplasm of unspecified part of left bronchus or lung: Secondary | ICD-10-CM | POA: Diagnosis not present

## 2023-11-10 DIAGNOSIS — Z5112 Encounter for antineoplastic immunotherapy: Secondary | ICD-10-CM

## 2023-11-10 DIAGNOSIS — C7972 Secondary malignant neoplasm of left adrenal gland: Secondary | ICD-10-CM | POA: Diagnosis not present

## 2023-11-10 DIAGNOSIS — Z79899 Other long term (current) drug therapy: Secondary | ICD-10-CM | POA: Diagnosis not present

## 2023-11-10 DIAGNOSIS — I110 Hypertensive heart disease with heart failure: Secondary | ICD-10-CM | POA: Diagnosis not present

## 2023-11-10 DIAGNOSIS — I5042 Chronic combined systolic (congestive) and diastolic (congestive) heart failure: Secondary | ICD-10-CM | POA: Diagnosis not present

## 2023-11-10 DIAGNOSIS — I4891 Unspecified atrial fibrillation: Secondary | ICD-10-CM | POA: Diagnosis not present

## 2023-11-10 DIAGNOSIS — Z7982 Long term (current) use of aspirin: Secondary | ICD-10-CM | POA: Diagnosis not present

## 2023-11-10 LAB — CBC WITH DIFFERENTIAL (CANCER CENTER ONLY)
Abs Immature Granulocytes: 0.02 K/uL (ref 0.00–0.07)
Basophils Absolute: 0.1 K/uL (ref 0.0–0.1)
Basophils Relative: 1 %
Eosinophils Absolute: 0.2 K/uL (ref 0.0–0.5)
Eosinophils Relative: 3 %
HCT: 39.4 % (ref 39.0–52.0)
Hemoglobin: 13.6 g/dL (ref 13.0–17.0)
Immature Granulocytes: 0 %
Lymphocytes Relative: 11 %
Lymphs Abs: 0.7 K/uL (ref 0.7–4.0)
MCH: 31.9 pg (ref 26.0–34.0)
MCHC: 34.5 g/dL (ref 30.0–36.0)
MCV: 92.5 fL (ref 80.0–100.0)
Monocytes Absolute: 0.4 K/uL (ref 0.1–1.0)
Monocytes Relative: 5 %
Neutro Abs: 5.5 K/uL (ref 1.7–7.7)
Neutrophils Relative %: 80 %
Platelet Count: 234 K/uL (ref 150–400)
RBC: 4.26 MIL/uL (ref 4.22–5.81)
RDW: 13.5 % (ref 11.5–15.5)
WBC Count: 6.8 K/uL (ref 4.0–10.5)
nRBC: 0 % (ref 0.0–0.2)

## 2023-11-10 LAB — CMP (CANCER CENTER ONLY)
ALT: 25 U/L (ref 0–44)
AST: 22 U/L (ref 15–41)
Albumin: 4.5 g/dL (ref 3.5–5.0)
Alkaline Phosphatase: 88 U/L (ref 38–126)
Anion gap: 5 (ref 5–15)
BUN: 17 mg/dL (ref 8–23)
CO2: 29 mmol/L (ref 22–32)
Calcium: 9.2 mg/dL (ref 8.9–10.3)
Chloride: 105 mmol/L (ref 98–111)
Creatinine: 1.3 mg/dL — ABNORMAL HIGH (ref 0.61–1.24)
GFR, Estimated: >60 mL/min (ref >60)
Glucose, Bld: 126 mg/dL — ABNORMAL HIGH (ref 70–99)
Potassium: 4.3 mmol/L (ref 3.5–5.1)
Sodium: 139 mmol/L (ref 135–145)
Total Bilirubin: 1.2 mg/dL (ref 0.0–1.2)
Total Protein: 6.8 g/dL (ref 6.5–8.1)

## 2023-11-10 MED ORDER — SODIUM CHLORIDE 0.9 % IV SOLN
200.0000 mg | Freq: Once | INTRAVENOUS | Status: AC
  Administered 2023-11-10: 200 mg via INTRAVENOUS
  Filled 2023-11-10: qty 200

## 2023-11-10 MED ORDER — SODIUM CHLORIDE 0.9 % IV SOLN: INTRAVENOUS | Status: DC

## 2023-11-10 MED ORDER — SODIUM CHLORIDE 0.9% FLUSH: 10.0000 mL | INTRAVENOUS | Status: DC | PRN

## 2023-11-10 NOTE — Patient Instructions (Signed)
 [facility] - A DEPT OF [facility]  Discharge Instructions: Thank you for choosing [facility] to provide your oncology and hematology care.   If you have a lab appointment with the Cancer Center, please go directly to the Cancer Center and check in at the registration area.   Wear comfortable clothing and clothing appropriate for easy access to any Portacath or PICC line.   We strive to give you quality time with your provider. You may need to reschedule your appointment if you arrive late (15 or more minutes).  Arriving late affects you and other patients whose appointments are after yours.  Also, if you miss three or more appointments without notifying the office, you may be dismissed from the clinic at the provider's discretion.      For prescription refill requests, have your pharmacy contact our office and allow 72 hours for refills to be completed.    Today you received the following chemotherapy and/or immunotherapy agents keytruda       To help prevent nausea and vomiting after your treatment, we encourage you to take your nausea medication as directed.  BELOW ARE SYMPTOMS THAT SHOULD BE REPORTED IMMEDIATELY: *FEVER GREATER THAN 100.4 F (38 C) OR HIGHER *CHILLS OR SWEATING *NAUSEA AND VOMITING THAT IS NOT CONTROLLED WITH YOUR NAUSEA MEDICATION *UNUSUAL SHORTNESS OF BREATH *UNUSUAL BRUISING OR BLEEDING *URINARY PROBLEMS (pain or burning when urinating, or frequent urination) *BOWEL PROBLEMS (unusual diarrhea, constipation, pain near the anus) TENDERNESS IN MOUTH AND THROAT WITH OR WITHOUT PRESENCE OF ULCERS (sore throat, sores in mouth, or a toothache) UNUSUAL RASH, SWELLING OR PAIN  UNUSUAL VAGINAL DISCHARGE OR ITCHING   Items with * indicate a potential emergency and should be followed up as soon as possible or go to the Emergency Department if any problems should occur.  Please show the CHEMOTHERAPY ALERT CARD or IMMUNOTHERAPY  ALERT CARD at check-in to the Emergency Department and triage nurse.  Should you have questions after your visit or need to cancel or reschedule your appointment, please contact [facility] - A DEPT OF [facility]  Dept: [phone]  and follow the prompts.  Office hours are 8:00 a.m. to 4:30 p.m. Monday - Friday. Please note that voicemails left after 4:00 p.m. may not be returned until the following business day.  We are closed weekends and major holidays. You have access to a nurse at all times for urgent questions. Please call the main number to the clinic Dept: [phone] and follow the prompts.   For any non-urgent questions, you may also contact your provider using MyChart. We now offer e-Visits for anyone 47 and older to request care online for non-urgent symptoms. For details visit [email or web address].   Also download the MyChart app! Go to the app store, search MyChart, open the app, select [place], and log in with your MyChart username and password.

## 2023-11-18 ENCOUNTER — Encounter (HOSPITAL_COMMUNITY)
Admission: RE | Admit: 2023-11-18 | Discharge: 2023-11-18 | Disposition: A | Discharge status: Home / Self Care | Source: Ambulatory Visit | Attending: Physician Assistant | Admitting: Physician Assistant

## 2023-11-18 DIAGNOSIS — Z5112 Encounter for antineoplastic immunotherapy: Secondary | ICD-10-CM | POA: Insufficient documentation

## 2023-11-18 DIAGNOSIS — C3492 Malignant neoplasm of unspecified part of left bronchus or lung: Secondary | ICD-10-CM | POA: Insufficient documentation

## 2023-11-18 DIAGNOSIS — Z8572 Personal history of non-Hodgkin lymphomas: Secondary | ICD-10-CM | POA: Diagnosis not present

## 2023-11-18 DIAGNOSIS — C3490 Malignant neoplasm of unspecified part of unspecified bronchus or lung: Secondary | ICD-10-CM | POA: Diagnosis not present

## 2023-11-18 LAB — GLUCOSE, CAPILLARY: Glucose-Capillary: 95 mg/dL (ref 70–99)

## 2023-11-18 MED ORDER — FLUDEOXYGLUCOSE F - 18 (FDG) INJECTION
9.2000 | Freq: Once | INTRAVENOUS | Status: AC
  Administered 2023-11-18: 9.25 via INTRAVENOUS

## 2023-11-18 MED ORDER — HEPARIN SOD (PORK) LOCK FLUSH 100 UNIT/ML IV SOLN
500.0000 [IU] | Freq: Once | INTRAVENOUS | Status: AC
  Administered 2023-11-18: 500 [IU] via INTRAVENOUS
  Filled 2023-11-18: qty 5

## 2023-12-01 ENCOUNTER — Inpatient Hospital Stay (HOSPITAL_BASED_OUTPATIENT_CLINIC_OR_DEPARTMENT_OTHER): Admitting: Internal Medicine

## 2023-12-01 ENCOUNTER — Inpatient Hospital Stay: Attending: Physician Assistant

## 2023-12-01 ENCOUNTER — Inpatient Hospital Stay

## 2023-12-01 VITALS — BP 123/65 | HR 76 | Temp 97.4°F | Resp 17 | Ht 71.0 in | Wt 189.0 lb

## 2023-12-01 DIAGNOSIS — Z800 Family history of malignant neoplasm of digestive organs: Secondary | ICD-10-CM | POA: Insufficient documentation

## 2023-12-01 DIAGNOSIS — Z8052 Family history of malignant neoplasm of bladder: Secondary | ICD-10-CM | POA: Diagnosis not present

## 2023-12-01 DIAGNOSIS — E039 Hypothyroidism, unspecified: Secondary | ICD-10-CM | POA: Insufficient documentation

## 2023-12-01 DIAGNOSIS — I2510 Atherosclerotic heart disease of native coronary artery without angina pectoris: Secondary | ICD-10-CM | POA: Diagnosis not present

## 2023-12-01 DIAGNOSIS — C7972 Secondary malignant neoplasm of left adrenal gland: Secondary | ICD-10-CM | POA: Diagnosis not present

## 2023-12-01 DIAGNOSIS — I4891 Unspecified atrial fibrillation: Secondary | ICD-10-CM | POA: Diagnosis not present

## 2023-12-01 DIAGNOSIS — Z923 Personal history of irradiation: Secondary | ICD-10-CM | POA: Insufficient documentation

## 2023-12-01 DIAGNOSIS — Z5112 Encounter for antineoplastic immunotherapy: Secondary | ICD-10-CM | POA: Diagnosis not present

## 2023-12-01 DIAGNOSIS — Z79899 Other long term (current) drug therapy: Secondary | ICD-10-CM | POA: Insufficient documentation

## 2023-12-01 DIAGNOSIS — C3492 Malignant neoplasm of unspecified part of left bronchus or lung: Secondary | ICD-10-CM

## 2023-12-01 DIAGNOSIS — C3412 Malignant neoplasm of upper lobe, left bronchus or lung: Secondary | ICD-10-CM | POA: Diagnosis not present

## 2023-12-01 DIAGNOSIS — I5042 Chronic combined systolic (congestive) and diastolic (congestive) heart failure: Secondary | ICD-10-CM | POA: Insufficient documentation

## 2023-12-01 DIAGNOSIS — Z9221 Personal history of antineoplastic chemotherapy: Secondary | ICD-10-CM | POA: Insufficient documentation

## 2023-12-01 DIAGNOSIS — Z8571 Personal history of Hodgkin lymphoma: Secondary | ICD-10-CM | POA: Insufficient documentation

## 2023-12-01 DIAGNOSIS — C7971 Secondary malignant neoplasm of right adrenal gland: Secondary | ICD-10-CM | POA: Insufficient documentation

## 2023-12-01 DIAGNOSIS — Z8042 Family history of malignant neoplasm of prostate: Secondary | ICD-10-CM | POA: Diagnosis not present

## 2023-12-01 LAB — CMP (CANCER CENTER ONLY)
ALT: 14 U/L (ref 0–44)
AST: 19 U/L (ref 15–41)
Albumin: 4.3 g/dL (ref 3.5–5.0)
Alkaline Phosphatase: 72 U/L (ref 38–126)
Anion gap: 5 (ref 5–15)
BUN: 22 mg/dL (ref 8–23)
CO2: 29 mmol/L (ref 22–32)
Calcium: 9.7 mg/dL (ref 8.9–10.3)
Chloride: 105 mmol/L (ref 98–111)
Creatinine: 1.29 mg/dL — ABNORMAL HIGH (ref 0.61–1.24)
GFR, Estimated: >60 mL/min (ref >60)
Glucose, Bld: 122 mg/dL — ABNORMAL HIGH (ref 70–99)
Potassium: 4.2 mmol/L (ref 3.5–5.1)
Sodium: 139 mmol/L (ref 135–145)
Total Bilirubin: 0.9 mg/dL (ref 0.0–1.2)
Total Protein: 6.7 g/dL (ref 6.5–8.1)

## 2023-12-01 LAB — CBC WITH DIFFERENTIAL (CANCER CENTER ONLY)
Abs Immature Granulocytes: 0.03 K/uL (ref 0.00–0.07)
Basophils Absolute: 0.1 K/uL (ref 0.0–0.1)
Basophils Relative: 1 %
Eosinophils Absolute: 0.3 K/uL (ref 0.0–0.5)
Eosinophils Relative: 4 %
HCT: 39.4 % (ref 39.0–52.0)
Hemoglobin: 13.8 g/dL (ref 13.0–17.0)
Immature Granulocytes: 1 %
Lymphocytes Relative: 14 %
Lymphs Abs: 0.9 K/uL (ref 0.7–4.0)
MCH: 32.2 pg (ref 26.0–34.0)
MCHC: 35 g/dL (ref 30.0–36.0)
MCV: 91.8 fL (ref 80.0–100.0)
Monocytes Absolute: 0.4 K/uL (ref 0.1–1.0)
Monocytes Relative: 6 %
Neutro Abs: 4.8 K/uL (ref 1.7–7.7)
Neutrophils Relative %: 74 %
Platelet Count: 231 K/uL (ref 150–400)
RBC: 4.29 MIL/uL (ref 4.22–5.81)
RDW: 13.2 % (ref 11.5–15.5)
WBC Count: 6.4 K/uL (ref 4.0–10.5)
nRBC: 0 % (ref 0.0–0.2)

## 2023-12-01 MED ORDER — SODIUM CHLORIDE 0.9 % IV SOLN: INTRAVENOUS | Status: DC

## 2023-12-01 MED ORDER — SODIUM CHLORIDE 0.9% FLUSH: 10.0000 mL | INTRAVENOUS | Status: DC | PRN

## 2023-12-01 MED ORDER — SODIUM CHLORIDE 0.9 % IV SOLN
200.0000 mg | Freq: Once | INTRAVENOUS | Status: AC
  Administered 2023-12-01: 200 mg via INTRAVENOUS
  Filled 2023-12-01: qty 200

## 2023-12-01 NOTE — Patient Instructions (Signed)
 [facility] - A DEPT OF [facility]  Discharge Instructions: Thank you for choosing [facility] to provide your oncology and hematology care.   If you have a lab appointment with the Cancer Center, please go directly to the Cancer Center and check in at the registration area.   Wear comfortable clothing and clothing appropriate for easy access to any Portacath or PICC line.   We strive to give you quality time with your provider. You may need to reschedule your appointment if you arrive late (15 or more minutes).  Arriving late affects you and other patients whose appointments are after yours.  Also, if you miss three or more appointments without notifying the office, you may be dismissed from the clinic at the provider's discretion.      For prescription refill requests, have your pharmacy contact our office and allow 72 hours for refills to be completed.    Today you received the following chemotherapy and/or immunotherapy agents keytruda       To help prevent nausea and vomiting after your treatment, we encourage you to take your nausea medication as directed.  BELOW ARE SYMPTOMS THAT SHOULD BE REPORTED IMMEDIATELY: *FEVER GREATER THAN 100.4 F (38 C) OR HIGHER *CHILLS OR SWEATING *NAUSEA AND VOMITING THAT IS NOT CONTROLLED WITH YOUR NAUSEA MEDICATION *UNUSUAL SHORTNESS OF BREATH *UNUSUAL BRUISING OR BLEEDING *URINARY PROBLEMS (pain or burning when urinating, or frequent urination) *BOWEL PROBLEMS (unusual diarrhea, constipation, pain near the anus) TENDERNESS IN MOUTH AND THROAT WITH OR WITHOUT PRESENCE OF ULCERS (sore throat, sores in mouth, or a toothache) UNUSUAL RASH, SWELLING OR PAIN  UNUSUAL VAGINAL DISCHARGE OR ITCHING   Items with * indicate a potential emergency and should be followed up as soon as possible or go to the Emergency Department if any problems should occur.  Please show the CHEMOTHERAPY ALERT CARD or IMMUNOTHERAPY  ALERT CARD at check-in to the Emergency Department and triage nurse.  Should you have questions after your visit or need to cancel or reschedule your appointment, please contact [facility] - A DEPT OF [facility]  Dept: [phone]  and follow the prompts.  Office hours are 8:00 a.m. to 4:30 p.m. Monday - Friday. Please note that voicemails left after 4:00 p.m. may not be returned until the following business day.  We are closed weekends and major holidays. You have access to a nurse at all times for urgent questions. Please call the main number to the clinic Dept: [phone] and follow the prompts.   For any non-urgent questions, you may also contact your provider using MyChart. We now offer e-Visits for anyone 47 and older to request care online for non-urgent symptoms. For details visit [email or web address].   Also download the MyChart app! Go to the app store, search MyChart, open the app, select [place], and log in with your MyChart username and password.

## 2023-12-01 NOTE — Progress Notes (Signed)
 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 He was diagnosed in October 2024.  He presented with metabolic central left upper lobe lung mass invading the mediastinum, small hypermetabolic right hilar lymph nodes, bilateral adrenal gland masses and splenic lesion consistent metastatic disease, hypermetabolic focus in the right gluteus maximus muscle.    Molecular studies by [employer] show MSI high.  Biomarker Findings Microsatellite status - MSI-High Tumor Mutational Burden - 56 Muts/Mb HRD signature - HRDsig Negative Genomic Findings For a complete list of the genes assayed, please refer to the Appendix. PALB2 F440fs*12 KRAS G12A NF1 I679fs*21 PIK3CA P17S - subclonal, H1047R? ASXL1 G645fs*58, K618fs*1 - subclonal? MSH2 loss exons 9-16 RAC1 P29H TP53 M237I 7 Disease relevant genes with no reportable alterations: ALK, BRAF, EGFR, ERBB2, MET, RET, ROS1   PDL1 98%.   PRIOR THERAPY: None   CURRENT THERAPY: Immunotherapy with Keytruda  200 mg IV every 3 weeks.  First dose on 12/29/22.  Status post 16 cycles.  INTERVAL HISTORY: Tyler Hendrix 68 y.o. male returns to the clinic today for follow-up visit.Discussed the use of AI scribe software for clinical note transcription with the patient, who gave verbal consent to proceed.  History of Present Illness Tyler Hendrix is a 68 year old male with stage four non-small cell lung cancer who presents for evaluation and repeat PET scan for restaging of his disease. He is accompanied by his wife.  He was diagnosed with stage four non-small cell lung cancer in October 2024. Molecular studies at the time of diagnosis showed MSI high and PD-L1 expression of 98%. He began treatment with systemic immunotherapy, specifically Keytruda  200 mg IV every  three weeks, and has completed 16 cycles.  He has not been sleeping well, attributing it to frequent urination, which he associates with his fluid intake during the day. No recent weight loss, and his weight has been remarkably consistent.  He engages in physical activity as advised by his cardiologist, specifically 30 minutes of fast walking per day, four days a week. He requires rest days for his legs and has been using a treadmill for exercise.    MEDICAL HISTORY: Past Medical History:  Diagnosis Date   Abnormal TSH    Arthritis    ddd   Atrial fibrillation and flutter (HCC)    s/p DCCV 03/26/2018   CAD in native artery    a. 11/2015: s/p 2 vessel PCI of the first diagonal branch of the LAD in the mid left circumflex with DES. // s/p CABG 01/2018   Chronic combined systolic and diastolic CHF (congestive heart failure) (HCC)    sees Dr. Kelsie    Complete heart block (HCC)    a. s/p Medtronic ppm 10/2015.   Dyspnea    with exertion   Family history of bladder cancer    Family history of prostate cancer    Family history of stomach cancer    History of radiation therapy 2002   Hodgkin disease (HCC) 2002   a. s/p chest radiation and chemotherapy   Hypertension    Hypothyroidism    sees Dr. Trixie    Incidental pulmonary nodule 01/19/2018   Left apical opacity - possible scarring   Inguinal hernia 10/14/2022   bilateral   Mitral regurgitation    s/p bioprosthetic MV  replacement 01/2018   Non-small cell lung cancer (NSCLC) (HCC) 11/2022   Pacemaker 10/2015   Medtronic   PVC's (premature ventricular contractions)    Right bundle branch block    S/P CABG x 2 01/27/2018   LIMA to LAD, SVG to RCA, EVH via right thigh   S/P mitral valve replacement with bioprosthetic valve 01/27/2018   29 mm [facility] Mitral stented bovine pericardial tissue valve    ALLERGIES:  is allergic to other.  MEDICATIONS:  Current Outpatient Medications  Medication Sig Dispense Refill    aspirin  EC 81 MG tablet Take 81 mg by mouth daily.     atorvastatin  (LIPITOR ) 40 MG tablet Take 1 tablet (40 mg total) by mouth daily. (Patient taking differently: Take 1 tablet (40 mg total) by mouth daily.) 90 tablet 3   Cholecalciferol  125 MCG (5000 UT) TABS Take 1 tablet by mouth daily. 30 tablet 6   furosemide  (LASIX ) 40 MG tablet Take 1 tablet (40 mg total) by mouth daily as needed for weight gain or dyspnea 30 tablet 2   levothyroxine  (SYNTHROID ) 75 MCG tablet Take 1 tablet (75 mcg total) by mouth daily before breakfast. 90 tablet 3   losartan  (COZAAR ) 25 MG tablet Take 0.5 tablets (12.5 mg total) by mouth at bedtime. 45 tablet 3   tamsulosin  (FLOMAX ) 0.4 MG CAPS capsule Take 1 capsule (0.4 mg total) by mouth daily. 90 capsule 3   tamsulosin  (FLOMAX ) 0.4 MG CAPS capsule Take 1 capsule (0.4 mg total) by mouth 2 (two) times daily. 180 capsule 3   traZODone  (DESYREL ) 50 MG tablet Take 1 tablet (50 mg total) by mouth at bedtime as needed for sleep. 90 tablet 3   No current facility-administered medications for this visit.    SURGICAL HISTORY:  Past Surgical History:  Procedure Laterality Date   A-FLUTTER ABLATION N/A 06/20/2021   Procedure: A-FLUTTER ABLATION;  Surgeon: Waddell Danelle ORN, MD;  Location: [facility] INVASIVE CV LAB;  Service: Cardiovascular;  Laterality: N/A;   BIV UPGRADE N/A 02/08/2019   Procedure: UPGRADE TO BIV PPM;  Surgeon: Kelsie Agent, MD;  Location: [facility] INVASIVE CV LAB;  Service: Cardiovascular;  Laterality: N/A;   BRONCHIAL BIOPSY  12/02/2022   Procedure: BRONCHIAL BIOPSIES;  Surgeon: Shelah Lamar RAMAN, MD;  Location: [facility] ENDOSCOPY;  Service: Pulmonary;;   BRONCHIAL BRUSHINGS  12/02/2022   Procedure: BRONCHIAL BRUSHINGS;  Surgeon: Shelah Lamar RAMAN, MD;  Location: [facility] ENDOSCOPY;  Service: Pulmonary;;   BRONCHIAL NEEDLE ASPIRATION BIOPSY  12/02/2022   Procedure: BRONCHIAL NEEDLE ASPIRATION BIOPSIES;  Surgeon: Shelah Lamar RAMAN, MD;  Location: [facility] ENDOSCOPY;  Service: Pulmonary;;    BRONCHIAL WASHINGS  12/02/2022   Procedure: BRONCHIAL WASHINGS;  Surgeon: Shelah Lamar RAMAN, MD;  Location: [facility] ENDOSCOPY;  Service: Pulmonary;;   CARDIAC CATHETERIZATION N/A 11/02/2015   Procedure: Left Heart Cath and Coronary Angiography;  Surgeon: Ozell Fell, MD;  Location: [facility] INVASIVE CV LAB;  Service: Cardiovascular;  Laterality: N/A;   CARDIAC CATHETERIZATION N/A 11/21/2015   Procedure: Coronary Stent Intervention;  Surgeon: Ozell Fell, MD;  Location: [facility] INVASIVE CV LAB;  Service: Cardiovascular;  Laterality: N/A;   CARDIAC CATHETERIZATION  11/2017   CARDIOVERSION N/A 03/26/2018   Procedure: CARDIOVERSION;  Surgeon: Loni Soyla LABOR, MD;  Location: [facility] ENDOSCOPY;  Service: Cardiovascular;  Laterality: N/A;   COLONOSCOPY  01/16/2015   per Dr. Teressa, benign polyps, repeat in 10 yrs    CORONARY ARTERY BYPASS GRAFT N/A 01/27/2018   Procedure: CORONARY ARTERY BYPASS GRAFTING (CABG) x two,  using left internal mammary artery and right leg greater saphenous vein harvested endoscopically;  Surgeon: Dusty Sudie DEL, MD;  Location: [facility] OR;  Service: Open Heart Surgery;  Laterality: N/A;   DIRECT LARYNGOSCOPY Bilateral 04/20/2023   Procedure: LARYNGOSCOPY, MICRO DIRECT WITH PROLARYN INJECTION;  Surgeon: Carlie Clark, MD;  Location: [facility] OR;  Service: ENT;  Laterality: Bilateral;   EP IMPLANTABLE DEVICE N/A 11/02/2015   MDT Adivisa MRI conditional dual chamber pacemaker implanted by Dr Kelsie for complete heart block   FEMUR IM NAIL Left 07/01/2022   Procedure: INTRAMEDULLARY (IM) NAIL FEMORAL LEFT ANTEGRADE;  Surgeon: Celena Sharper, MD;  Location: [facility] OR;  Service: Orthopedics;  Laterality: Left;   IR IMAGING GUIDED PORT INSERTION  01/02/2023   KNEE SURGERY Right 1972   LEAD EXTRACTION N/A 10/20/2022   Procedure: LEAD EXTRACTION;  Surgeon: Waddell Danelle ORN, MD;  Location: [facility] INVASIVE CV LAB;  Service: Cardiovascular;  Laterality: N/A;   LUMBAR LAMINECTOMY  1996   MITRAL VALVE REPLACEMENT N/A  01/27/2018   Procedure: MITRAL VALVE (MV) REPLACEMENT using Magna Mitral Ease Valve Size ;  Surgeon: Dusty Sudie DEL, MD;  Location: [facility] OR;  Service: Open Heart Surgery;  Laterality: N/A;   RIGHT/LEFT HEART CATH AND CORONARY ANGIOGRAPHY N/A 12/02/2017   Procedure: RIGHT/LEFT HEART CATH AND CORONARY ANGIOGRAPHY;  Surgeon: Dann Candyce RAMAN, MD;  Location: [facility] INVASIVE CV LAB;  Service: Cardiovascular;  Laterality: N/A;   TEE WITHOUT CARDIOVERSION N/A 12/09/2017   Procedure: TRANSESOPHAGEAL ECHOCARDIOGRAM (TEE);  Surgeon: Lonni Slain, MD;  Location: [facility] ENDOSCOPY;  Service: Cardiovascular;  Laterality: N/A;   TEE WITHOUT CARDIOVERSION N/A 01/27/2018   Procedure: TRANSESOPHAGEAL ECHOCARDIOGRAM (TEE);  Surgeon: Dusty Sudie DEL, MD;  Location: [facility] OR;  Service: Open Heart Surgery;  Laterality: N/A;   VIDEO BRONCHOSCOPY WITH RADIAL ENDOBRONCHIAL ULTRASOUND  12/02/2022   Procedure: VIDEO BRONCHOSCOPY WITH RADIAL ENDOBRONCHIAL ULTRASOUND;  Surgeon: Shelah Lamar RAMAN, MD;  Location: [facility] ENDOSCOPY;  Service: Pulmonary;;    REVIEW OF SYSTEMS:  Constitutional: negative Eyes: negative Ears, nose, mouth, throat, and face: negative Respiratory: negative Cardiovascular: negative Gastrointestinal: negative Genitourinary:negative Integument/breast: negative Hematologic/lymphatic: negative Musculoskeletal:negative Neurological: negative Behavioral/Psych: negative Endocrine: negative Allergic/Immunologic: negative   PHYSICAL EXAMINATION: General appearance: alert, cooperative, and no distress Head: Normocephalic, without obvious abnormality, atraumatic Neck: no adenopathy, no JVD, supple, symmetrical, trachea midline, and thyroid  not enlarged, symmetric, no tenderness/mass/nodules Lymph nodes: Cervical, supraclavicular, and axillary nodes normal. Resp: clear to auscultation bilaterally Back: symmetric, no curvature. ROM normal. No CVA tenderness. Cardio: regular rate and rhythm, S1, S2  normal, no murmur, click, rub or gallop GI: soft, non-tender; bowel sounds normal; no masses,  no organomegaly Extremities: extremities normal, atraumatic, no cyanosis or edema Neurologic: Alert and oriented X 3, normal strength and tone. Normal symmetric reflexes. Normal coordination and gait  ECOG PERFORMANCE STATUS: 1 - Symptomatic but completely ambulatory  Blood pressure (!) 132/58, pulse 76, temperature (!) 97.4 F (36.3 C), resp. rate 17, height 5' 11 (1.803 m), weight 189 lb (85.7 kg), SpO2 99%.  LABORATORY DATA: Lab Results  Component Value Date   WBC 6.4 12/01/2023   HGB 13.8 12/01/2023   HCT 39.4 12/01/2023   MCV 91.8 12/01/2023   PLT 231 12/01/2023      Chemistry      Component Value Date/Time   NA 139 11/10/2023 0937   NA 137 06/06/2021 0919   K 4.3 11/10/2023 0937   CL 105 11/10/2023 0937   CO2 29 11/10/2023 0937   BUN 17 11/10/2023 9062  BUN 19 06/06/2021 0919   CREATININE 1.30 (H) 11/10/2023 0937   CREATININE 1.04 01/01/2016 0801      Component Value Date/Time   CALCIUM  9.2 11/10/2023 0937   ALKPHOS 88 11/10/2023 0937   AST 22 11/10/2023 0937   ALT 25 11/10/2023 0937   BILITOT 1.2 11/10/2023 9062       RADIOGRAPHIC STUDIES: NM PET Image Restag (PS) Skull Base To Thigh Result Date: 11/20/2023 CLINICAL DATA:  Subsequent treatment strategy for non-small cell lung cancer. Also history of lymphoma. EXAM: NUCLEAR MEDICINE PET SKULL BASE TO THIGH TECHNIQUE: 9.3 mCi F-18 FDG was injected intravenously. Full-ring PET imaging was performed from the skull base to thigh after the radiotracer. CT data was obtained and used for attenuation correction and anatomic localization. Fasting blood glucose: 95 mg/dl COMPARISON:  89/71/7975 and CT scan 11/03/2023 FINDINGS: Mediastinal blood pool activity: SUV max 2.1 Liver activity: SUV max NA NECK: No significant abnormal hypermetabolic activity in this region. Incidental CT findings: Bilateral common carotid atheromatous  vascular calcifications. CHEST: Irregular left upper lobe paramediastinal lesion stable in size from 11/03/2023 but substantially reduced in size from prior PET-CT of 12/08/2022. Current maximum SUV 3.8, formerly 19.9 on 12/08/2022. Appearance compatible with interval effective therapy. A small left infrahilar node has a maximum SUV of 4.0, previously 3.5. Activity observed along the tip of the Port-A-Cath, probably along a fibrin sheath. Incidental CT findings: Right Port-A-Cath tip: Cavoatrial junction. Pacer noted along with mitral valve prosthesis. Moderate cardiomegaly. Prior CABG. ABDOMEN/PELVIS: Right gastric node 0.9 cm in short axis on image 115 series 4, maximum SUV 4.9 and previously 3.2. Previous hypermetabolic adrenal metastatic lesions have resolved. Previous hypermetabolic lesion along the left diaphragm has resolved. Incidental CT findings: Faint dependent density in the gallbladder, cannot exclude small gallstones or sludge. Atherosclerosis is present, including aortoiliac atherosclerotic disease. Mild prostatomegaly. Bilateral groin hernias contain adipose tissue. SKELETON: No significant abnormal hypermetabolic activity in this region. Incidental CT findings: Mild anterior bridging spurring of the left sacroiliac joint. Left femoral IM nail. IMPRESSION: 1. Interval effective therapy with substantial reduction in size and metabolic activity of the left upper lobe paramediastinal lesion compared to prior PET-CT (current maximum SUV 3.8, formerly 19.9). 2. Previous hypermetabolic adrenal metastatic lesions have resolved. 3. A small left infrahilar node has a maximum SUV of 4.0, previously 3.5. A right gastric node has a maximum SUV of 4.9, previously 3.2. Both are moderately above blood pool activity but technically nonspecific. 4. Moderate cardiomegaly. 5. Bilateral groin hernias contain adipose tissue. 6. Mild prostatomegaly. 7. Faint dependent density in the gallbladder, cannot exclude small  gallstones or sludge. 8.  Aortic Atherosclerosis (ICD10-I70.0). Q Electronically Signed   By: Ryan Salvage M.D.   On: 11/20/2023 12:54   CT CHEST ABDOMEN PELVIS W CONTRAST Result Date: 11/04/2023 CLINICAL DATA:  Non-small-cell lung cancer, staging. * Tracking Code: BO * EXAM: CT CHEST, ABDOMEN, AND PELVIS WITH CONTRAST TECHNIQUE: Multidetector CT imaging of the chest, abdomen and pelvis was performed following the standard protocol during bolus administration of intravenous contrast. RADIATION DOSE REDUCTION: This exam was performed according to the departmental dose-optimization program which includes automated exposure control, adjustment of the mA and/or kV according to patient size and/or use of iterative reconstruction technique. CONTRAST:  OMNIPAQUE  IOHEXOL  300 MG/ML  SOLN COMPARISON:  Multiple priors including CT September 02, 2023 FINDINGS: CT CHEST FINDINGS Cardiovascular: Accessed right chest Port-A-Cath with tip in the right atrium. Left chest cardiac generator with unchanged position of the leads. Four-chamber  cardiac enlargement. Normal caliber thoracic aorta. Aortic atherosclerosis. Mediastinum/Nodes: No suspicious thyroid  nodule. No pathologically enlarged mediastinal, hilar or axillary lymph nodes. Similar soft tissue thickening along the left hilum. Lungs/Pleura: Decreased size of the spiculated left upper lobe paramediastinal pulmonary nodule on image 50/4 measuring 2.2 x 1.3 cm previously 2.8 x 1.4 cm. Pulmonary nodule in the right lung base which was new on prior examination not seen today. Similar irregular scarring in the left-greater-than-right lung bases with associated small calcified and noncalcified nodules. Elevation of left hemidiaphragm. No pleural effusion.  No pneumothorax. Musculoskeletal: No aggressive lytic or blastic lesion of bone. Prior median sternotomy. CT ABDOMEN PELVIS FINDINGS Hepatobiliary: No suspicious hepatic lesion. Gallbladder is unremarkable. No biliary  ductal dilation. Pancreas: No pancreatic ductal dilation or evidence of acute inflammation. Spleen: No splenomegaly or focal splenic lesion. Adrenals/Urinary Tract: Similar posttreatment appearance of bilateral adrenal metastasis common no longer discretely measurable. No hydronephrosis.  No renal, ureteral or bladder calculi. Stomach/Bowel: Stomach is unremarkable for degree of distension. No pathologic dilation of small or large bowel. Vascular/Lymphatic: Aortic atherosclerosis. Normal caliber abdominal aorta. Smooth IVC contours. The portal, splenic and superior mesenteric veins are patent. Increased size of a celiac axis lymph node now measuring 11 mm in short axis on image 61/2 previously 8 mm, this has been waxing and waning in size over multiple prior examinations and was not hypermetabolic on PET-CT December 08, 2022. Reproductive: Prostate gland is enlarged. Other: Bilateral inguinal hernias containing fat and nonobstructed portions of bowel. Musculoskeletal: No aggressive lytic or blastic lesion of bone. Partially visualized left femoral fixation hardware. Degenerative change of the bilateral hips. Lumbar spondylosis. IMPRESSION: 1. Decreased size of the spiculated left upper lobe paramediastinal pulmonary nodule. 2. Similar posttreatment appearance of bilateral adrenal metastasis, no longer discretely measurable. 3. Increased size of a celiac axis lymph node now measuring 11 mm in short axis previously 8 mm, this has been waxing and waning in size over multiple prior examinations and was not hypermetabolic on PET-CT December 08, 2022. Nonspecific and favored reactive. Suggest continued attention on follow-up imaging. 4. Pulmonary nodule in the right lung base which was new on prior examination not seen today. 5. Similar irregular scarring in the left-greater-than-right lung bases with associated small calcified and noncalcified nodules. Suggest continued attention on follow-up imaging. 6. Bilateral inguinal  hernias containing fat and nonobstructed portions of bowel. Electronically Signed   By: Reyes Holder M.D.   On: 11/04/2023 16:41     ASSESSMENT AND PLAN: This is a very pleasant 68 years old white male with stage IV lung cancer, felt to be poorly differentiated NSCLC. He was diagnosed in October 2024.  He presented with metabolic central left upper lobe lung mass invading the mediastinum, small hypermetabolic right hilar lymph nodes, bilateral adrenal gland masses and splenic lesion consistent metastatic disease, hypermetabolic focus in the right gluteus maximus muscle.  His molecular studies by foundation 1 showed MSI high and PD-L1 expression of 98%.  The patient is currently on treatment with immunotherapy with single agent Keytruda  200 Mg IV every 3 weeks status post 16 cycles. He has been tolerating this treatment fairly well. The patient had a PET scan performed recently.  I personally independently reviewed the scan images and discussed the results with the patient and his wife.  His scan showed significant improvement of his disease compared to the initial PET scan.  There was some mild increase in SUV activity and a right gastric lymph node as well as a small  left infrahilar node. 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 Currently on Keytruda  200 mg IV every three weeks, status post 16 cycles. Recent PET scan shows stable disease with some tiny lymph nodes unchanged in size but with altered activity, likely not concerning. Adrenal gland, diaphragm, and splenic metastases have resolved. There is an 80% reduction in metabolic activity, indicating a positive response to treatment. The goal is to continue treatment for two years, with reassessment at that time to determine further management.  He remains active and healthy, with no recent weight loss. - Continue Keytruda  200 mg IV every three weeks. - Scheduled next cycle in three weeks. - Encouraged regular exercise, such as 30 minutes of brisk walking four days a week. He was advised to call immediately if he has any concerning symptoms in the interval. The patient voices understanding of current disease status and treatment options and is in agreement with the current care plan.  All questions were answered. The patient knows to call the clinic with any problems, questions or concerns. We can certainly see the patient much sooner if necessary.  Disclaimer: This note was dictated with voice recognition software. Similar sounding words can inadvertently be transcribed and may not be corrected upon review.

## 2023-12-12 ENCOUNTER — Other Ambulatory Visit (HOSPITAL_COMMUNITY): Payer: Self-pay

## 2023-12-15 ENCOUNTER — Other Ambulatory Visit: Payer: Self-pay

## 2023-12-16 ENCOUNTER — Other Ambulatory Visit: Payer: Self-pay | Admitting: Internal Medicine

## 2023-12-16 DIAGNOSIS — C3492 Malignant neoplasm of unspecified part of left bronchus or lung: Secondary | ICD-10-CM

## 2023-12-22 ENCOUNTER — Inpatient Hospital Stay: Admitting: Internal Medicine

## 2023-12-22 ENCOUNTER — Inpatient Hospital Stay: Attending: Physician Assistant

## 2023-12-22 ENCOUNTER — Inpatient Hospital Stay

## 2023-12-22 VITALS — BP 115/73 | HR 92 | Temp 97.9°F | Resp 17 | Ht 71.0 in | Wt 189.0 lb

## 2023-12-22 DIAGNOSIS — Z8052 Family history of malignant neoplasm of bladder: Secondary | ICD-10-CM | POA: Insufficient documentation

## 2023-12-22 DIAGNOSIS — I110 Hypertensive heart disease with heart failure: Secondary | ICD-10-CM | POA: Insufficient documentation

## 2023-12-22 DIAGNOSIS — C3492 Malignant neoplasm of unspecified part of left bronchus or lung: Secondary | ICD-10-CM

## 2023-12-22 DIAGNOSIS — Z79899 Other long term (current) drug therapy: Secondary | ICD-10-CM | POA: Insufficient documentation

## 2023-12-22 DIAGNOSIS — E039 Hypothyroidism, unspecified: Secondary | ICD-10-CM | POA: Insufficient documentation

## 2023-12-22 DIAGNOSIS — Z79890 Hormone replacement therapy (postmenopausal): Secondary | ICD-10-CM | POA: Insufficient documentation

## 2023-12-22 DIAGNOSIS — Z8042 Family history of malignant neoplasm of prostate: Secondary | ICD-10-CM | POA: Diagnosis not present

## 2023-12-22 DIAGNOSIS — Z7982 Long term (current) use of aspirin: Secondary | ICD-10-CM | POA: Diagnosis not present

## 2023-12-22 DIAGNOSIS — Z800 Family history of malignant neoplasm of digestive organs: Secondary | ICD-10-CM | POA: Insufficient documentation

## 2023-12-22 DIAGNOSIS — Z5112 Encounter for antineoplastic immunotherapy: Secondary | ICD-10-CM | POA: Insufficient documentation

## 2023-12-22 DIAGNOSIS — Z9221 Personal history of antineoplastic chemotherapy: Secondary | ICD-10-CM | POA: Insufficient documentation

## 2023-12-22 DIAGNOSIS — C7972 Secondary malignant neoplasm of left adrenal gland: Secondary | ICD-10-CM | POA: Insufficient documentation

## 2023-12-22 DIAGNOSIS — I5042 Chronic combined systolic (congestive) and diastolic (congestive) heart failure: Secondary | ICD-10-CM | POA: Diagnosis not present

## 2023-12-22 DIAGNOSIS — I4891 Unspecified atrial fibrillation: Secondary | ICD-10-CM | POA: Diagnosis not present

## 2023-12-22 DIAGNOSIS — I2510 Atherosclerotic heart disease of native coronary artery without angina pectoris: Secondary | ICD-10-CM | POA: Diagnosis not present

## 2023-12-22 DIAGNOSIS — C3412 Malignant neoplasm of upper lobe, left bronchus or lung: Secondary | ICD-10-CM | POA: Insufficient documentation

## 2023-12-22 DIAGNOSIS — Z923 Personal history of irradiation: Secondary | ICD-10-CM | POA: Insufficient documentation

## 2023-12-22 DIAGNOSIS — C7971 Secondary malignant neoplasm of right adrenal gland: Secondary | ICD-10-CM | POA: Insufficient documentation

## 2023-12-22 DIAGNOSIS — Z95828 Presence of other vascular implants and grafts: Secondary | ICD-10-CM

## 2023-12-22 LAB — CMP (CANCER CENTER ONLY)
ALT: 15 U/L (ref 0–44)
AST: 21 U/L (ref 15–41)
Albumin: 4.2 g/dL (ref 3.5–5.0)
Alkaline Phosphatase: 69 U/L (ref 38–126)
Anion gap: 5 (ref 5–15)
BUN: 23 mg/dL (ref 8–23)
CO2: 28 mmol/L (ref 22–32)
Calcium: 9.1 mg/dL (ref 8.9–10.3)
Chloride: 106 mmol/L (ref 98–111)
Creatinine: 1.14 mg/dL (ref 0.61–1.24)
GFR, Estimated: >60 mL/min (ref >60)
Glucose, Bld: 106 mg/dL — ABNORMAL HIGH (ref 70–99)
Potassium: 4.1 mmol/L (ref 3.5–5.1)
Sodium: 139 mmol/L (ref 135–145)
Total Bilirubin: 1.4 mg/dL — ABNORMAL HIGH (ref 0.0–1.2)
Total Protein: 6.8 g/dL (ref 6.5–8.1)

## 2023-12-22 LAB — CBC WITH DIFFERENTIAL (CANCER CENTER ONLY)
Abs Immature Granulocytes: 0.03 K/uL (ref 0.00–0.07)
Basophils Absolute: 0.1 K/uL (ref 0.0–0.1)
Basophils Relative: 1 %
Eosinophils Absolute: 0.4 K/uL (ref 0.0–0.5)
Eosinophils Relative: 5 %
HCT: 38.9 % — ABNORMAL LOW (ref 39.0–52.0)
Hemoglobin: 13.6 g/dL (ref 13.0–17.0)
Immature Granulocytes: 1 %
Lymphocytes Relative: 12 %
Lymphs Abs: 0.8 K/uL (ref 0.7–4.0)
MCH: 32.1 pg (ref 26.0–34.0)
MCHC: 35 g/dL (ref 30.0–36.0)
MCV: 91.7 fL (ref 80.0–100.0)
Monocytes Absolute: 0.4 K/uL (ref 0.1–1.0)
Monocytes Relative: 7 %
Neutro Abs: 5 K/uL (ref 1.7–7.7)
Neutrophils Relative %: 74 %
Platelet Count: 209 K/uL (ref 150–400)
RBC: 4.24 MIL/uL (ref 4.22–5.81)
RDW: 13.2 % (ref 11.5–15.5)
WBC Count: 6.6 K/uL (ref 4.0–10.5)
nRBC: 0 % (ref 0.0–0.2)

## 2023-12-22 LAB — TSH: TSH: 6 u[IU]/mL — ABNORMAL HIGH (ref 0.350–4.500)

## 2023-12-22 MED ORDER — SODIUM CHLORIDE 0.9 % IV SOLN
200.0000 mg | Freq: Once | INTRAVENOUS | Status: AC
  Administered 2023-12-22: 200 mg via INTRAVENOUS
  Filled 2023-12-22: qty 200

## 2023-12-22 MED ORDER — SODIUM CHLORIDE 0.9 % IV SOLN: INTRAVENOUS | Status: DC

## 2023-12-22 MED ORDER — SODIUM CHLORIDE 0.9% FLUSH
10.0000 mL | Freq: Once | INTRAVENOUS | Status: AC
  Administered 2023-12-22: 10 mL

## 2023-12-22 NOTE — Patient Instructions (Signed)
 [facility] - A DEPT OF [facility]   Discharge Instructions: Thank you for choosing [facility] to provide your oncology and hematology care.   If you have a lab appointment with the Cancer Center, please go directly to the Cancer Center and check in at the registration area.   Wear comfortable clothing and clothing appropriate for easy access to any Portacath or PICC line.   We strive to give you quality time with your provider. You may need to reschedule your appointment if you arrive late (15 or more minutes).  Arriving late affects you and other patients whose appointments are after yours.  Also, if you miss three or more appointments without notifying the office, you may be dismissed from the clinic at the provider's discretion.      For prescription refill requests, have your pharmacy contact our office and allow 72 hours for refills to be completed.    Today you received the following chemotherapy and/or immunotherapy agents: Pembrolizumab Tyler Hendrix)      To help prevent nausea and vomiting after your treatment, we encourage you to take your nausea medication as directed.  BELOW ARE SYMPTOMS THAT SHOULD BE REPORTED IMMEDIATELY: *FEVER GREATER THAN 100.4 F (38 C) OR HIGHER *CHILLS OR SWEATING *NAUSEA AND VOMITING THAT IS NOT CONTROLLED WITH YOUR NAUSEA MEDICATION *UNUSUAL SHORTNESS OF BREATH *UNUSUAL BRUISING OR BLEEDING *URINARY PROBLEMS (pain or burning when urinating, or frequent urination) *BOWEL PROBLEMS (unusual diarrhea, constipation, pain near the anus) TENDERNESS IN MOUTH AND THROAT WITH OR WITHOUT PRESENCE OF ULCERS (sore throat, sores in mouth, or a toothache) UNUSUAL RASH, SWELLING OR PAIN  UNUSUAL VAGINAL DISCHARGE OR ITCHING   Items with * indicate a potential emergency and should be followed up as soon as possible or go to the Emergency Department if any problems should occur.  Please show the CHEMOTHERAPY ALERT CARD  or IMMUNOTHERAPY ALERT CARD at check-in to the Emergency Department and triage nurse.  Should you have questions after your visit or need to cancel or reschedule your appointment, please contact [facility] - A DEPT [facility]  Dept: [phone]  and follow the prompts.  Office hours are 8:00 a.m. to 4:30 p.m. Monday - Friday. Please note that voicemails left after 4:00 p.m. may not be returned until the following business day.  We are closed weekends and major holidays. You have access to a nurse at all times for urgent questions. Please call the main number to the clinic Dept: [phone] and follow the prompts.   For any non-urgent questions, you may also contact your provider using MyChart. We now offer e-Visits for anyone 75 and older to request care online for non-urgent symptoms. For details visit [email or web address].   Also download the MyChart app! Go to the app store, search "MyChart", open the app, select , and log in with your MyChart username and password.

## 2023-12-22 NOTE — Progress Notes (Signed)
 [facility] [phone]   [phone]  OFFICE PROGRESS NOTE  Tyler Hendrix LABOR, MD [address] [place] [address]  DIAGNOSIS:  Stage IV lung cancer, felt to be poorly differentiated NSCLC. He was diagnosed in October 2024.  He presented with metabolic central left upper lobe lung mass invading the mediastinum, small hypermetabolic right hilar lymph nodes, bilateral adrenal gland masses and splenic lesion consistent metastatic disease, hypermetabolic focus in the right gluteus maximus muscle.    Molecular studies by [employer] show MSI high.  Biomarker Findings Microsatellite status - MSI-High Tumor Mutational Burden - 59 Muts/Mb HRD signature - HRDsig Negative Genomic Findings For a complete list of the genes assayed, please refer to the Appendix. PALB2 F440fs*12 KRAS G12A NF1 I679fs*21 PIK3CA P17S - subclonal, H1047R? ASXL1 G645fs*58, K618fs*1 - subclonal? MSH2 loss exons 9-16 RAC1 P29H TP53 M237I 7 Disease relevant genes with no reportable alterations: ALK, BRAF, EGFR, ERBB2, MET, RET, ROS1   PDL1 98%.   PRIOR THERAPY: None   CURRENT THERAPY: Immunotherapy with Keytruda  200 mg IV every 3 weeks.  First dose on 12/29/22.  Status post 17 cycles.  INTERVAL HISTORY: Tyler Hendrix 68 y.o. male returns to the clinic today for follow-up visit.Discussed the use of AI scribe software for clinical note transcription with the patient, who gave verbal consent to proceed.  History of Present Illness Tyler Hendrix is a 68 year old male with stage 4 non-small cell lung cancer who presents for evaluation before starting cycle eighteen of palliative immunotherapy. He is accompanied by his wife.  He has stage 4 non-small cell lung cancer with high microsatellite instability (MSI) and a PD-1 expression of 98%. He is undergoing palliative immunotherapy with Keytruda , receiving 200 mg IV every 3 weeks. He is here for evaluation before  starting his eighteenth cycle of treatment.  He feels 'pretty good' overall with no new changes or bothersome symptoms. No current chest pain or shortness of breath. He recalls a past episode of left-sided chest pain while lying down, which he attributes to possibly being muscular due to gym activities, specifically bench pressing. His cardiovascular workouts have improved.  He mentions a previous change in the size of red blood cells.  Socially, he and his wife are kept busy by their children's sports activities. Their oldest daughter recently finished her volleyball season, and their youngest's soccer season has also ended. He shares that their son played varsity soccer in high school and is a [employer].    MEDICAL HISTORY: Past Medical History:  Diagnosis Date   Abnormal TSH    Arthritis    ddd   Atrial fibrillation and flutter (HCC)    s/p DCCV 03/26/2018   CAD in native artery    a. 11/2015: s/p 2 vessel PCI of the first diagonal branch of the LAD in the mid left circumflex with DES. // s/p CABG 01/2018   Chronic combined systolic and diastolic CHF (congestive heart failure) (HCC)    sees Dr. Kelsie    Complete heart block (HCC)    a. s/p Medtronic ppm 10/2015.   Dyspnea    with exertion   Family history of bladder cancer    Family history of prostate cancer    Family history of stomach cancer    History of radiation therapy 2002   Hodgkin disease (HCC) 2002   a. s/p chest radiation and chemotherapy   Hypertension    Hypothyroidism  sees Dr. Trixie    Incidental pulmonary nodule 01/19/2018   Left apical opacity - possible scarring   Inguinal hernia 10/14/2022   bilateral   Mitral regurgitation    s/p bioprosthetic MV replacement 01/2018   Non-small cell lung cancer (NSCLC) (HCC) 11/2022   Pacemaker 10/2015   Medtronic   PVC's (premature ventricular contractions)    Right bundle branch block    S/P CABG x 2 01/27/2018   LIMA to LAD, SVG to RCA, EVH via  right thigh   S/P mitral valve replacement with bioprosthetic valve 01/27/2018   29 mm [facility] Mitral stented bovine pericardial tissue valve    ALLERGIES:  is allergic to other.  MEDICATIONS:  Current Outpatient Medications  Medication Sig Dispense Refill   aspirin  EC 81 MG tablet Take 81 mg by mouth daily.     atorvastatin  (LIPITOR ) 40 MG tablet Take 1 tablet (40 mg total) by mouth daily. (Patient taking differently: Take 1 tablet (40 mg total) by mouth daily.) 90 tablet 3   Cholecalciferol  125 MCG (5000 UT) TABS Take 1 tablet by mouth daily. 30 tablet 6   furosemide  (LASIX ) 40 MG tablet Take 1 tablet (40 mg total) by mouth daily as needed for weight gain or dyspnea 30 tablet 2   levothyroxine  (SYNTHROID ) 75 MCG tablet Take 1 tablet (75 mcg total) by mouth daily before breakfast. 90 tablet 3   losartan  (COZAAR ) 25 MG tablet Take 0.5 tablets (12.5 mg total) by mouth at bedtime. 45 tablet 3   tamsulosin  (FLOMAX ) 0.4 MG CAPS capsule Take 1 capsule (0.4 mg total) by mouth daily. 90 capsule 3   tamsulosin  (FLOMAX ) 0.4 MG CAPS capsule Take 1 capsule (0.4 mg total) by mouth 2 (two) times daily. 180 capsule 3   traZODone  (DESYREL ) 50 MG tablet Take 1 tablet (50 mg total) by mouth at bedtime as needed for sleep. 90 tablet 3   No current facility-administered medications for this visit.    SURGICAL HISTORY:  Past Surgical History:  Procedure Laterality Date   A-FLUTTER ABLATION N/A 06/20/2021   Procedure: A-FLUTTER ABLATION;  Surgeon: Waddell Danelle ORN, MD;  Location: [facility] INVASIVE CV LAB;  Service: Cardiovascular;  Laterality: N/A;   BIV UPGRADE N/A 02/08/2019   Procedure: UPGRADE TO BIV PPM;  Surgeon: Kelsie Agent, MD;  Location: [facility] INVASIVE CV LAB;  Service: Cardiovascular;  Laterality: N/A;   BRONCHIAL BIOPSY  12/02/2022   Procedure: BRONCHIAL BIOPSIES;  Surgeon: Shelah Lamar RAMAN, MD;  Location: [facility] ENDOSCOPY;  Service: Pulmonary;;   BRONCHIAL BRUSHINGS  12/02/2022   Procedure: BRONCHIAL  BRUSHINGS;  Surgeon: Shelah Lamar RAMAN, MD;  Location: [facility] ENDOSCOPY;  Service: Pulmonary;;   BRONCHIAL NEEDLE ASPIRATION BIOPSY  12/02/2022   Procedure: BRONCHIAL NEEDLE ASPIRATION BIOPSIES;  Surgeon: Shelah Lamar RAMAN, MD;  Location: [facility] ENDOSCOPY;  Service: Pulmonary;;   BRONCHIAL WASHINGS  12/02/2022   Procedure: BRONCHIAL WASHINGS;  Surgeon: Shelah Lamar RAMAN, MD;  Location: [facility] ENDOSCOPY;  Service: Pulmonary;;   CARDIAC CATHETERIZATION N/A 11/02/2015   Procedure: Left Heart Cath and Coronary Angiography;  Surgeon: Ozell Fell, MD;  Location: [facility] INVASIVE CV LAB;  Service: Cardiovascular;  Laterality: N/A;   CARDIAC CATHETERIZATION N/A 11/21/2015   Procedure: Coronary Stent Intervention;  Surgeon: Ozell Fell, MD;  Location: [facility] INVASIVE CV LAB;  Service: Cardiovascular;  Laterality: N/A;   CARDIAC CATHETERIZATION  11/2017   CARDIOVERSION N/A 03/26/2018   Procedure: CARDIOVERSION;  Surgeon: Loni Soyla LABOR, MD;  Location: [facility] ENDOSCOPY;  Service: Cardiovascular;  Laterality:  N/A;   COLONOSCOPY  01/16/2015   per Dr. Teressa, benign polyps, repeat in 10 yrs    CORONARY ARTERY BYPASS GRAFT N/A 01/27/2018   Procedure: CORONARY ARTERY BYPASS GRAFTING (CABG) x two, using left internal mammary artery and right leg greater saphenous vein harvested endoscopically;  Surgeon: Dusty Sudie DEL, MD;  Location: [facility] OR;  Service: Open Heart Surgery;  Laterality: N/A;   DIRECT LARYNGOSCOPY Bilateral 04/20/2023   Procedure: LARYNGOSCOPY, MICRO DIRECT WITH PROLARYN INJECTION;  Surgeon: Carlie Clark, MD;  Location: [facility] OR;  Service: ENT;  Laterality: Bilateral;   EP IMPLANTABLE DEVICE N/A 11/02/2015   MDT Adivisa MRI conditional dual chamber pacemaker implanted by Dr Kelsie for complete heart block   FEMUR IM NAIL Left 07/01/2022   Procedure: INTRAMEDULLARY (IM) NAIL FEMORAL LEFT ANTEGRADE;  Surgeon: Celena Sharper, MD;  Location: [facility] OR;  Service: Orthopedics;  Laterality: Left;   IR IMAGING GUIDED PORT INSERTION   01/02/2023   KNEE SURGERY Right 1972   LEAD EXTRACTION N/A 10/20/2022   Procedure: LEAD EXTRACTION;  Surgeon: Waddell Danelle ORN, MD;  Location: [facility] INVASIVE CV LAB;  Service: Cardiovascular;  Laterality: N/A;   LUMBAR LAMINECTOMY  1996   MITRAL VALVE REPLACEMENT N/A 01/27/2018   Procedure: MITRAL VALVE (MV) REPLACEMENT using Magna Mitral Ease Valve Size ;  Surgeon: Dusty Sudie DEL, MD;  Location: [facility] OR;  Service: Open Heart Surgery;  Laterality: N/A;   RIGHT/LEFT HEART CATH AND CORONARY ANGIOGRAPHY N/A 12/02/2017   Procedure: RIGHT/LEFT HEART CATH AND CORONARY ANGIOGRAPHY;  Surgeon: Dann Candyce RAMAN, MD;  Location: [facility] INVASIVE CV LAB;  Service: Cardiovascular;  Laterality: N/A;   TEE WITHOUT CARDIOVERSION N/A 12/09/2017   Procedure: TRANSESOPHAGEAL ECHOCARDIOGRAM (TEE);  Surgeon: Lonni Slain, MD;  Location: [facility] ENDOSCOPY;  Service: Cardiovascular;  Laterality: N/A;   TEE WITHOUT CARDIOVERSION N/A 01/27/2018   Procedure: TRANSESOPHAGEAL ECHOCARDIOGRAM (TEE);  Surgeon: Dusty Sudie DEL, MD;  Location: [facility] OR;  Service: Open Heart Surgery;  Laterality: N/A;   VIDEO BRONCHOSCOPY WITH RADIAL ENDOBRONCHIAL ULTRASOUND  12/02/2022   Procedure: VIDEO BRONCHOSCOPY WITH RADIAL ENDOBRONCHIAL ULTRASOUND;  Surgeon: Shelah Lamar RAMAN, MD;  Location: [facility] ENDOSCOPY;  Service: Pulmonary;;    REVIEW OF SYSTEMS:  A comprehensive review of systems was negative.   PHYSICAL EXAMINATION: General appearance: alert, cooperative, and no distress Head: Normocephalic, without obvious abnormality, atraumatic Neck: no adenopathy, no JVD, supple, symmetrical, trachea midline, and thyroid  not enlarged, symmetric, no tenderness/mass/nodules Lymph nodes: Cervical, supraclavicular, and axillary nodes normal. Resp: clear to auscultation bilaterally Back: symmetric, no curvature. ROM normal. No CVA tenderness. Cardio: regular rate and rhythm, S1, S2 normal, no murmur, click, rub or gallop GI: soft, non-tender; bowel  sounds normal; no masses,  no organomegaly Extremities: extremities normal, atraumatic, no cyanosis or edema  ECOG PERFORMANCE STATUS: 0 - Asymptomatic  Blood pressure 115/73, pulse 92, temperature 97.9 F (36.6 C), temperature source Temporal, resp. rate 17, height 5' 11 (1.803 m), weight 189 lb (85.7 kg), SpO2 99%.  LABORATORY DATA: Lab Results  Component Value Date   WBC 6.6 12/22/2023   HGB 13.6 12/22/2023   HCT 38.9 (L) 12/22/2023   MCV 91.7 12/22/2023   PLT 209 12/22/2023      Chemistry      Component Value Date/Time   NA 139 12/01/2023 0854   NA 137 06/06/2021 0919   K 4.2 12/01/2023 0854   CL 105 12/01/2023 0854   CO2 29 12/01/2023 0854   BUN 22 12/01/2023 0854   BUN 19 06/06/2021 0919  CREATININE 1.29 (H) 12/01/2023 0854   CREATININE 1.04 01/01/2016 0801      Component Value Date/Time   CALCIUM  9.7 12/01/2023 0854   ALKPHOS 72 12/01/2023 0854   AST 19 12/01/2023 0854   ALT 14 12/01/2023 0854   BILITOT 0.9 12/01/2023 0854       RADIOGRAPHIC STUDIES: No results found.    ASSESSMENT AND PLAN: This is a very pleasant 68 years old white male with stage IV lung cancer, felt to be poorly differentiated NSCLC. He was diagnosed in October 2024.  He presented with metabolic central left upper lobe lung mass invading the mediastinum, small hypermetabolic right hilar lymph nodes, bilateral adrenal gland masses and splenic lesion consistent metastatic disease, hypermetabolic focus in the right gluteus maximus muscle.  His molecular studies by [employer] showed MSI high and PD-L1 expression of 98%.  The patient is currently on treatment with immunotherapy with single agent Keytruda  200 Mg IV every 3 weeks status post 17 cycles. He has been tolerating this treatment fairly well. Assessment and Plan Assessment & Plan Stage 4 non-small cell lung cancer, MSI-high, PD-1 accelerated, on palliative immunotherapy Stage 4 non-small cell lung cancer with MSI-high and PD-1  acceleration of 98%. Currently on palliative immunotherapy with single-agent Keytruda  (pembrolizumab ) 200 mg IV every 3 weeks. Completed 17 cycles with no significant new symptoms. Reports one episode of left-sided chest pain, possibly muscular, and stable shortness of breath. Lab results show no significant issues, with some change in size of red blood cells but not clinically significant. - Continue Keytruda  200 mg IV every 3 weeks. - Monitor for recurrent chest pain and evaluate if it occurs again. - Scheduled follow-up appointment in 3 weeks. The patient was advised to call immediately if he has any concerning symptoms in the interval.  The patient voices understanding of current disease status and treatment options and is in agreement with the current care plan.  All questions were answered. The patient knows to call the clinic with any problems, questions or concerns. We can certainly see the patient much sooner if necessary.  Disclaimer: This note was dictated with voice recognition software. Similar sounding words can inadvertently be transcribed and may not be corrected upon review.

## 2023-12-23 LAB — T4: T4, Total: 7.3 ug/dL (ref 4.5–12.0)

## 2024-01-09 ENCOUNTER — Other Ambulatory Visit (HOSPITAL_COMMUNITY): Payer: Self-pay

## 2024-01-12 ENCOUNTER — Inpatient Hospital Stay: Attending: Physician Assistant

## 2024-01-12 ENCOUNTER — Inpatient Hospital Stay

## 2024-01-12 ENCOUNTER — Other Ambulatory Visit: Payer: Self-pay

## 2024-01-12 ENCOUNTER — Inpatient Hospital Stay: Admitting: Internal Medicine

## 2024-01-12 VITALS — BP 121/69 | HR 83 | Temp 97.6°F | Resp 17 | Ht 71.0 in | Wt 188.0 lb

## 2024-01-12 DIAGNOSIS — C7972 Secondary malignant neoplasm of left adrenal gland: Secondary | ICD-10-CM | POA: Insufficient documentation

## 2024-01-12 DIAGNOSIS — C3492 Malignant neoplasm of unspecified part of left bronchus or lung: Secondary | ICD-10-CM

## 2024-01-12 DIAGNOSIS — Z5112 Encounter for antineoplastic immunotherapy: Secondary | ICD-10-CM | POA: Insufficient documentation

## 2024-01-12 DIAGNOSIS — C3412 Malignant neoplasm of upper lobe, left bronchus or lung: Secondary | ICD-10-CM | POA: Diagnosis present

## 2024-01-12 DIAGNOSIS — C3490 Malignant neoplasm of unspecified part of unspecified bronchus or lung: Secondary | ICD-10-CM | POA: Diagnosis not present

## 2024-01-12 DIAGNOSIS — Z8571 Personal history of Hodgkin lymphoma: Secondary | ICD-10-CM | POA: Insufficient documentation

## 2024-01-12 DIAGNOSIS — Z8042 Family history of malignant neoplasm of prostate: Secondary | ICD-10-CM | POA: Insufficient documentation

## 2024-01-12 DIAGNOSIS — C7971 Secondary malignant neoplasm of right adrenal gland: Secondary | ICD-10-CM | POA: Insufficient documentation

## 2024-01-12 DIAGNOSIS — C781 Secondary malignant neoplasm of mediastinum: Secondary | ICD-10-CM | POA: Insufficient documentation

## 2024-01-12 DIAGNOSIS — Z8052 Family history of malignant neoplasm of bladder: Secondary | ICD-10-CM | POA: Insufficient documentation

## 2024-01-12 DIAGNOSIS — Z9221 Personal history of antineoplastic chemotherapy: Secondary | ICD-10-CM | POA: Diagnosis not present

## 2024-01-12 DIAGNOSIS — Z800 Family history of malignant neoplasm of digestive organs: Secondary | ICD-10-CM | POA: Diagnosis not present

## 2024-01-12 DIAGNOSIS — Z79899 Other long term (current) drug therapy: Secondary | ICD-10-CM | POA: Diagnosis not present

## 2024-01-12 DIAGNOSIS — Z923 Personal history of irradiation: Secondary | ICD-10-CM | POA: Diagnosis not present

## 2024-01-12 LAB — CBC WITH DIFFERENTIAL (CANCER CENTER ONLY)
Abs Immature Granulocytes: 0.02 K/uL (ref 0.00–0.07)
Basophils Absolute: 0 K/uL (ref 0.0–0.1)
Basophils Relative: 1 %
Eosinophils Absolute: 0.3 K/uL (ref 0.0–0.5)
Eosinophils Relative: 5 %
HCT: 38 % — ABNORMAL LOW (ref 39.0–52.0)
Hemoglobin: 13.2 g/dL (ref 13.0–17.0)
Immature Granulocytes: 0 %
Lymphocytes Relative: 12 %
Lymphs Abs: 0.7 K/uL (ref 0.7–4.0)
MCH: 32.1 pg (ref 26.0–34.0)
MCHC: 34.7 g/dL (ref 30.0–36.0)
MCV: 92.5 fL (ref 80.0–100.0)
Monocytes Absolute: 0.4 K/uL (ref 0.1–1.0)
Monocytes Relative: 6 %
Neutro Abs: 4.4 K/uL (ref 1.7–7.7)
Neutrophils Relative %: 76 %
Platelet Count: 203 K/uL (ref 150–400)
RBC: 4.11 MIL/uL — ABNORMAL LOW (ref 4.22–5.81)
RDW: 13.4 % (ref 11.5–15.5)
WBC Count: 5.8 K/uL (ref 4.0–10.5)
nRBC: 0 % (ref 0.0–0.2)

## 2024-01-12 LAB — CMP (CANCER CENTER ONLY)
ALT: 22 U/L (ref 0–44)
AST: 28 U/L (ref 15–41)
Albumin: 4.3 g/dL (ref 3.5–5.0)
Alkaline Phosphatase: 72 U/L (ref 38–126)
Anion gap: 8 (ref 5–15)
BUN: 20 mg/dL (ref 8–23)
CO2: 26 mmol/L (ref 22–32)
Calcium: 9.1 mg/dL (ref 8.9–10.3)
Chloride: 105 mmol/L (ref 98–111)
Creatinine: 1.31 mg/dL — ABNORMAL HIGH (ref 0.61–1.24)
GFR, Estimated: 60 mL/min — ABNORMAL LOW (ref >60)
Glucose, Bld: 134 mg/dL — ABNORMAL HIGH (ref 70–99)
Potassium: 3.9 mmol/L (ref 3.5–5.1)
Sodium: 139 mmol/L (ref 135–145)
Total Bilirubin: 0.8 mg/dL (ref 0.0–1.2)
Total Protein: 6.6 g/dL (ref 6.5–8.1)

## 2024-01-12 MED ORDER — SODIUM CHLORIDE 0.9 % IV SOLN: INTRAVENOUS | Status: DC

## 2024-01-12 MED ORDER — SODIUM CHLORIDE 0.9 % IV SOLN
200.0000 mg | Freq: Once | INTRAVENOUS | Status: AC
  Administered 2024-01-12: 200 mg via INTRAVENOUS
  Filled 2024-01-12: qty 200

## 2024-01-12 NOTE — Progress Notes (Signed)
 [facility] Telephone:([phone]) [phone]   Fax:([phone]  OFFICE PROGRESS NOTE  Tyler Garnette LABOR, MD [address] 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 He was diagnosed in October 2024.  He presented with metabolic central left upper lobe lung mass invading the mediastinum, small hypermetabolic right hilar lymph nodes, bilateral adrenal gland masses and splenic lesion consistent metastatic disease, hypermetabolic focus in the right gluteus maximus muscle.    Molecular studies by [employer] show MSI high.  Biomarker Findings Microsatellite status - MSI-High Tumor Mutational Burden - 56 Muts/Mb HRD signature - HRDsig Negative Genomic Findings For a complete list of the genes assayed, please refer to the Appendix. PALB2 F440fs*12 KRAS G12A NF1 I679fs*21 PIK3CA P17S - subclonal, H1047R? ASXL1 G645fs*58, K618fs*1 - subclonal? MSH2 loss exons 9-16 RAC1 P29H TP53 M237I 7 Disease relevant genes with no reportable alterations: ALK, BRAF, EGFR, ERBB2, MET, RET, ROS1   PDL1 98%.   PRIOR THERAPY: None   CURRENT THERAPY: Immunotherapy with Keytruda  200 mg IV every 3 weeks.  First dose on 12/29/22.  Status post 18 cycles.  INTERVAL HISTORY: Tyler Hendrix 68 y.o. male returns to the clinic today for follow-up visit.Discussed the use of AI scribe software for clinical note transcription with the patient, who gave verbal consent to proceed.  History of Present Illness Tyler Hendrix is a 68 year old male undergoing chemotherapy for lung cancer who presents for evaluation before starting cycle nineteen. He is accompanied by his wife.  He is receiving immunotherapy with Keytruda  every three weeks for lung cancer and has completed eighteen cycles. He describes having 'good days and bad' but considers this 'sort of normal.' Over the recent weekend, he felt 'real weak' but attributes this to possibly  picking up a bug. No sore throat or fever.  His last PET scan was conducted on November 18, 2023, and his blood work, including hemoglobin and white blood count, is normal.    MEDICAL HISTORY: Past Medical History:  Diagnosis Date   Abnormal TSH    Arthritis    ddd   Atrial fibrillation and flutter (HCC)    s/p DCCV 03/26/2018   CAD in native artery    a. 11/2015: s/p 2 vessel PCI of the first diagonal branch of the LAD in the mid left circumflex with DES. // s/p CABG 01/2018   Chronic combined systolic and diastolic CHF (congestive heart failure) (HCC)    sees Dr. Kelsie    Complete heart block (HCC)    a. s/p Medtronic ppm 10/2015.   Dyspnea    with exertion   Family history of bladder cancer    Family history of prostate cancer    Family history of stomach cancer    History of radiation therapy 2002   Hodgkin disease (HCC) 2002   a. s/p chest radiation and chemotherapy   Hypertension    Hypothyroidism    sees Dr. Trixie    Incidental pulmonary nodule 01/19/2018   Left apical opacity - possible scarring   Inguinal hernia 10/14/2022   bilateral   Mitral regurgitation    s/p bioprosthetic MV replacement 01/2018   Non-small cell lung cancer (NSCLC) (HCC) 11/2022   Pacemaker 10/2015   Medtronic   PVC's (premature ventricular contractions)    Right bundle branch block    S/P CABG x 2 01/27/2018   LIMA to LAD, SVG to RCA, EVH via right  thigh   S/P mitral valve replacement with bioprosthetic valve 01/27/2018   29 mm [facility] Mitral stented bovine pericardial tissue valve    ALLERGIES:  is allergic to other.  MEDICATIONS:  Current Outpatient Medications  Medication Sig Dispense Refill   aspirin  EC 81 MG tablet Take 81 mg by mouth daily.     atorvastatin  (LIPITOR ) 40 MG tablet Take 1 tablet (40 mg total) by mouth daily. (Patient taking differently: Take 1 tablet (40 mg total) by mouth daily.) 90 tablet 3   Cholecalciferol  125 MCG (5000 UT) TABS Take 1 tablet by mouth  daily. 30 tablet 6   furosemide  (LASIX ) 40 MG tablet Take 1 tablet (40 mg total) by mouth daily as needed for weight gain or dyspnea 30 tablet 2   levothyroxine  (SYNTHROID ) 75 MCG tablet Take 1 tablet (75 mcg total) by mouth daily before breakfast. 90 tablet 3   losartan  (COZAAR ) 25 MG tablet Take 0.5 tablets (12.5 mg total) by mouth at bedtime. 45 tablet 3   tamsulosin  (FLOMAX ) 0.4 MG CAPS capsule Take 1 capsule (0.4 mg total) by mouth daily. 90 capsule 3   tamsulosin  (FLOMAX ) 0.4 MG CAPS capsule Take 1 capsule (0.4 mg total) by mouth 2 (two) times daily. 180 capsule 3   traZODone  (DESYREL ) 50 MG tablet Take 1 tablet (50 mg total) by mouth at bedtime as needed for sleep. 90 tablet 3   No current facility-administered medications for this visit.    SURGICAL HISTORY:  Past Surgical History:  Procedure Laterality Date   A-FLUTTER ABLATION N/A 06/20/2021   Procedure: A-FLUTTER ABLATION;  Surgeon: Waddell Danelle ORN, MD;  Location: [facility] INVASIVE CV LAB;  Service: Cardiovascular;  Laterality: N/A;   BIV UPGRADE N/A 02/08/2019   Procedure: UPGRADE TO BIV PPM;  Surgeon: Kelsie Agent, MD;  Location: [facility] INVASIVE CV LAB;  Service: Cardiovascular;  Laterality: N/A;   BRONCHIAL BIOPSY  12/02/2022   Procedure: BRONCHIAL BIOPSIES;  Surgeon: Shelah Lamar RAMAN, MD;  Location: [facility] ENDOSCOPY;  Service: Pulmonary;;   BRONCHIAL BRUSHINGS  12/02/2022   Procedure: BRONCHIAL BRUSHINGS;  Surgeon: Shelah Lamar RAMAN, MD;  Location: [facility] ENDOSCOPY;  Service: Pulmonary;;   BRONCHIAL NEEDLE ASPIRATION BIOPSY  12/02/2022   Procedure: BRONCHIAL NEEDLE ASPIRATION BIOPSIES;  Surgeon: Shelah Lamar RAMAN, MD;  Location: [facility] ENDOSCOPY;  Service: Pulmonary;;   BRONCHIAL WASHINGS  12/02/2022   Procedure: BRONCHIAL WASHINGS;  Surgeon: Shelah Lamar RAMAN, MD;  Location: [facility] ENDOSCOPY;  Service: Pulmonary;;   CARDIAC CATHETERIZATION N/A 11/02/2015   Procedure: Left Heart Cath and Coronary Angiography;  Surgeon: Ozell Fell, MD;  Location: [facility]  INVASIVE CV LAB;  Service: Cardiovascular;  Laterality: N/A;   CARDIAC CATHETERIZATION N/A 11/21/2015   Procedure: Coronary Stent Intervention;  Surgeon: Ozell Fell, MD;  Location: [facility] INVASIVE CV LAB;  Service: Cardiovascular;  Laterality: N/A;   CARDIAC CATHETERIZATION  11/2017   CARDIOVERSION N/A 03/26/2018   Procedure: CARDIOVERSION;  Surgeon: Loni Soyla LABOR, MD;  Location: [facility] ENDOSCOPY;  Service: Cardiovascular;  Laterality: N/A;   COLONOSCOPY  01/16/2015   per Dr. Teressa, benign polyps, repeat in 10 yrs    CORONARY ARTERY BYPASS GRAFT N/A 01/27/2018   Procedure: CORONARY ARTERY BYPASS GRAFTING (CABG) x two, using left internal mammary artery and right leg greater saphenous vein harvested endoscopically;  Surgeon: Dusty Sudie DEL, MD;  Location: [facility] OR;  Service: Open Heart Surgery;  Laterality: N/A;   DIRECT LARYNGOSCOPY Bilateral 04/20/2023   Procedure: LARYNGOSCOPY, MICRO DIRECT WITH PROLARYN INJECTION;  Surgeon: Carlie Clark,  MD;  Location: [facility] OR;  Service: ENT;  Laterality: Bilateral;   EP IMPLANTABLE DEVICE N/A 11/02/2015   MDT Adivisa MRI conditional dual chamber pacemaker implanted by Dr Kelsie for complete heart block   FEMUR IM NAIL Left 07/01/2022   Procedure: INTRAMEDULLARY (IM) NAIL FEMORAL LEFT ANTEGRADE;  Surgeon: Celena Sharper, MD;  Location: [facility] OR;  Service: Orthopedics;  Laterality: Left;   IR IMAGING GUIDED PORT INSERTION  01/02/2023   KNEE SURGERY Right 1972   LEAD EXTRACTION N/A 10/20/2022   Procedure: LEAD EXTRACTION;  Surgeon: Waddell Danelle ORN, MD;  Location: [facility] INVASIVE CV LAB;  Service: Cardiovascular;  Laterality: N/A;   LUMBAR LAMINECTOMY  1996   MITRAL VALVE REPLACEMENT N/A 01/27/2018   Procedure: MITRAL VALVE (MV) REPLACEMENT using Magna Mitral Ease Valve Size ;  Surgeon: Dusty Sudie DEL, MD;  Location: [facility] OR;  Service: Open Heart Surgery;  Laterality: N/A;   RIGHT/LEFT HEART CATH AND CORONARY ANGIOGRAPHY N/A 12/02/2017   Procedure: RIGHT/LEFT HEART  CATH AND CORONARY ANGIOGRAPHY;  Surgeon: Dann Candyce RAMAN, MD;  Location: [facility] INVASIVE CV LAB;  Service: Cardiovascular;  Laterality: N/A;   TEE WITHOUT CARDIOVERSION N/A 12/09/2017   Procedure: TRANSESOPHAGEAL ECHOCARDIOGRAM (TEE);  Surgeon: Lonni Slain, MD;  Location: [facility] ENDOSCOPY;  Service: Cardiovascular;  Laterality: N/A;   TEE WITHOUT CARDIOVERSION N/A 01/27/2018   Procedure: TRANSESOPHAGEAL ECHOCARDIOGRAM (TEE);  Surgeon: Dusty Sudie DEL, MD;  Location: [facility] OR;  Service: Open Heart Surgery;  Laterality: N/A;   VIDEO BRONCHOSCOPY WITH RADIAL ENDOBRONCHIAL ULTRASOUND  12/02/2022   Procedure: VIDEO BRONCHOSCOPY WITH RADIAL ENDOBRONCHIAL ULTRASOUND;  Surgeon: Shelah Lamar RAMAN, MD;  Location: [facility] ENDOSCOPY;  Service: Pulmonary;;    REVIEW OF SYSTEMS:  A comprehensive review of systems was negative except for: Constitutional: positive for fatigue   PHYSICAL EXAMINATION: General appearance: alert, cooperative, and no distress Head: Normocephalic, without obvious abnormality, atraumatic Neck: no adenopathy, no JVD, supple, symmetrical, trachea midline, and thyroid  not enlarged, symmetric, no tenderness/mass/nodules Lymph nodes: Cervical, supraclavicular, and axillary nodes normal. Resp: clear to auscultation bilaterally Back: symmetric, no curvature. ROM normal. No CVA tenderness. Cardio: regular rate and rhythm, S1, S2 normal, no murmur, click, rub or gallop GI: soft, non-tender; bowel sounds normal; no masses,  no organomegaly Extremities: extremities normal, atraumatic, no cyanosis or edema  ECOG PERFORMANCE STATUS: 0 - Asymptomatic  Blood pressure 121/69, pulse 83, temperature 97.6 F (36.4 C), temperature source Temporal, resp. rate 17, height 5' 11 (1.803 m), weight 188 lb (85.3 kg), SpO2 98%.  LABORATORY DATA: Lab Results  Component Value Date   WBC 5.8 01/12/2024   HGB 13.2 01/12/2024   HCT 38.0 (L) 01/12/2024   MCV 92.5 01/12/2024   PLT 203 01/12/2024       Chemistry      Component Value Date/Time   NA 139 12/22/2023 0855   NA 137 06/06/2021 0919   K 4.1 12/22/2023 0855   CL 106 12/22/2023 0855   CO2 28 12/22/2023 0855   BUN 23 12/22/2023 0855   BUN 19 06/06/2021 0919   CREATININE 1.14 12/22/2023 0855   CREATININE 1.04 01/01/2016 0801      Component Value Date/Time   CALCIUM  9.1 12/22/2023 0855   ALKPHOS 69 12/22/2023 0855   AST 21 12/22/2023 0855   ALT 15 12/22/2023 0855   BILITOT 1.4 (H) 12/22/2023 0855       RADIOGRAPHIC STUDIES: No results found.    ASSESSMENT AND PLAN: This is a very pleasant 68 years old white male with stage  IV lung cancer, felt to be poorly differentiated NSCLC. He was diagnosed in October 2024.  He presented with metabolic central left upper lobe lung mass invading the mediastinum, small hypermetabolic right hilar lymph nodes, bilateral adrenal gland masses and splenic lesion consistent metastatic disease, hypermetabolic focus in the right gluteus maximus muscle.  His molecular studies by [employer] showed MSI high and PD-L1 expression of 98%.  The patient is currently on treatment with immunotherapy with single agent Keytruda  200 Mg IV every 3 weeks status post 18 cycles. He has been tolerating this treatment fairly well. Assessment and Plan Assessment & Plan Lung cancer under active chemotherapy treatment Lung cancer is being actively treated with chemotherapy. He is status post eighteen cycles and is scheduled to start cycle nineteen. Reports feeling weak over the weekend, but denies sore throat or fever. Laboratory results are acceptable, with normal hemoglobin and white blood count. Last PET scan was on October 8th, 2025, and a regular CT scan is planned before the next visit. MRI brain was last done in October 2024, and it may be repeated due to the nature of lung cancer, which can metastasize unexpectedly. - Proceed with cycle nineteen of chemotherapy. - Ordered regular CT scan before the next  visit. - Will consider MRI brain with the next scan to monitor for potential metastasis. - Scheduled follow-up in three weeks. The patient was advised to call immediately if he has any other concerning symptoms in the interval. The patient voices understanding of current disease status and treatment options and is in agreement with the current care plan.  All questions were answered. The patient knows to call the clinic with any problems, questions or concerns. We can certainly see the patient much sooner if necessary.  Disclaimer: This note was dictated with voice recognition software. Similar sounding words can inadvertently be transcribed and may not be corrected upon review.

## 2024-01-12 NOTE — Patient Instructions (Signed)

## 2024-01-13 ENCOUNTER — Other Ambulatory Visit: Payer: Self-pay | Admitting: Physician Assistant

## 2024-01-13 ENCOUNTER — Telehealth: Payer: Self-pay | Admitting: *Deleted

## 2024-01-13 DIAGNOSIS — C3492 Malignant neoplasm of unspecified part of left bronchus or lung: Secondary | ICD-10-CM

## 2024-01-13 NOTE — Telephone Encounter (Signed)
 Contacted by Ferol w/[facility] Scheduling - MRI of brain cannot be done due to pacemaker. A CT can be done with the pacemaker.  Patient contacted office and LVM: MRI cannot be done due to his pacemaker and asked if Dr. Sherrod would order a CT like last time. Dr.Mohamed informed. New order received for brain CT from C.Heilingoetter, PA. Contacted [facility] with this information. CT Brain scheduled 01/26/24 w/ CT CAP. Contacted patient to provide information above - LVM with all information.

## 2024-01-19 ENCOUNTER — Ambulatory Visit: Payer: 59

## 2024-01-19 DIAGNOSIS — I442 Atrioventricular block, complete: Secondary | ICD-10-CM | POA: Diagnosis not present

## 2024-01-21 ENCOUNTER — Ambulatory Visit: Payer: Self-pay | Admitting: Internal Medicine

## 2024-01-21 DIAGNOSIS — H2512 Age-related nuclear cataract, left eye: Secondary | ICD-10-CM | POA: Diagnosis not present

## 2024-01-21 DIAGNOSIS — H11152 Pinguecula, left eye: Secondary | ICD-10-CM | POA: Diagnosis not present

## 2024-01-21 DIAGNOSIS — H25011 Cortical age-related cataract, right eye: Secondary | ICD-10-CM | POA: Diagnosis not present

## 2024-01-21 DIAGNOSIS — H524 Presbyopia: Secondary | ICD-10-CM | POA: Diagnosis not present

## 2024-01-21 DIAGNOSIS — J984 Other disorders of lung: Secondary | ICD-10-CM | POA: Diagnosis not present

## 2024-01-21 LAB — CUP PACEART REMOTE DEVICE CHECK
Battery Remaining Longevity: 104 mo
Battery Voltage: 3 V
Brady Statistic AP VP Percent: 69.51 %
Brady Statistic AP VS Percent: 0.1 %
Brady Statistic AS VP Percent: 27.77 %
Brady Statistic AS VS Percent: 2.63 %
Brady Statistic RA Percent Paced: 71.16 %
Brady Statistic RV Percent Paced: 97.27 %
Date Time Interrogation Session: 20251208214706
Implantable Lead Connection Status: 753985
Implantable Lead Connection Status: 753985
Implantable Lead Connection Status: 753985
Implantable Lead Implant Date: 20170922
Implantable Lead Implant Date: 20170922
Implantable Lead Implant Date: 20240909
Implantable Lead Location: 753859
Implantable Lead Location: 753860
Implantable Lead Location: 753860
Implantable Lead Model: 3830
Implantable Lead Model: 5076
Implantable Lead Model: 5076
Implantable Pulse Generator Implant Date: 20240909
Lead Channel Impedance Value: 2831 Ohm
Lead Channel Impedance Value: 304 Ohm
Lead Channel Impedance Value: 304 Ohm
Lead Channel Impedance Value: 323 Ohm
Lead Channel Impedance Value: 399 Ohm
Lead Channel Impedance Value: 418 Ohm
Lead Channel Impedance Value: 437 Ohm
Lead Channel Impedance Value: 551 Ohm
Lead Channel Impedance Value: 646 Ohm
Lead Channel Pacing Threshold Amplitude: 0.5 V
Lead Channel Pacing Threshold Amplitude: 0.75 V
Lead Channel Pacing Threshold Amplitude: 1 V
Lead Channel Pacing Threshold Pulse Width: 0.4 ms
Lead Channel Pacing Threshold Pulse Width: 0.4 ms
Lead Channel Pacing Threshold Pulse Width: 0.4 ms
Lead Channel Sensing Intrinsic Amplitude: 3.875 mV
Lead Channel Sensing Intrinsic Amplitude: 3.875 mV
Lead Channel Sensing Intrinsic Amplitude: 7 mV
Lead Channel Sensing Intrinsic Amplitude: 7 mV
Lead Channel Setting Pacing Amplitude: 1.25 V
Lead Channel Setting Pacing Amplitude: 1.5 V
Lead Channel Setting Pacing Amplitude: 2.5 V
Lead Channel Setting Pacing Pulse Width: 0.4 ms
Lead Channel Setting Pacing Pulse Width: 0.4 ms
Lead Channel Setting Sensing Sensitivity: 2.8 mV
Zone Setting Status: 755011
Zone Setting Status: 755011

## 2024-01-26 ENCOUNTER — Ambulatory Visit (HOSPITAL_COMMUNITY): Admission: RE | Admit: 2024-01-26 | Discharge: 2024-01-26 | Attending: Internal Medicine | Admitting: Internal Medicine

## 2024-01-26 DIAGNOSIS — I700 Atherosclerosis of aorta: Secondary | ICD-10-CM | POA: Diagnosis not present

## 2024-01-26 DIAGNOSIS — C3492 Malignant neoplasm of unspecified part of left bronchus or lung: Secondary | ICD-10-CM | POA: Diagnosis not present

## 2024-01-26 DIAGNOSIS — C3490 Malignant neoplasm of unspecified part of unspecified bronchus or lung: Secondary | ICD-10-CM | POA: Diagnosis not present

## 2024-01-26 DIAGNOSIS — K8020 Calculus of gallbladder without cholecystitis without obstruction: Secondary | ICD-10-CM | POA: Diagnosis not present

## 2024-01-26 MED ORDER — HEPARIN SOD (PORK) LOCK FLUSH 100 UNIT/ML IV SOLN
INTRAVENOUS | Status: AC
  Filled 2024-01-26: qty 5

## 2024-01-26 MED ORDER — HEPARIN SOD (PORK) LOCK FLUSH 100 UNIT/ML IV SOLN
500.0000 [IU] | Freq: Once | INTRAVENOUS | Status: AC
  Administered 2024-01-26: 13:00:00 500 [IU] via INTRAVENOUS

## 2024-01-26 MED ORDER — IOHEXOL 9 MG/ML PO SOLN
500.0000 mL | ORAL | Status: AC
  Administered 2024-01-26 (×2): 500 mL via ORAL

## 2024-01-26 MED ORDER — IOHEXOL 300 MG/ML  SOLN
100.0000 mL | Freq: Once | INTRAMUSCULAR | Status: AC | PRN
  Administered 2024-01-26: 12:00:00 100 mL via INTRAVENOUS

## 2024-01-27 ENCOUNTER — Ambulatory Visit: Payer: Self-pay | Admitting: *Deleted

## 2024-01-27 NOTE — Telephone Encounter (Signed)
 Patient returned my call and left a message to call him back.  Return call to patient.  Informed him that his brain scan was fine per Cassie/PA.  Verbalized understanding.  States he was still waiting on his other scan results.

## 2024-01-27 NOTE — Telephone Encounter (Signed)
 Called and left voicemail message for patient to call back.  Scan is normal per Cassie/PA

## 2024-01-27 NOTE — Progress Notes (Signed)
 Remote PPM Transmission

## 2024-02-02 ENCOUNTER — Inpatient Hospital Stay: Admitting: Internal Medicine

## 2024-02-02 ENCOUNTER — Inpatient Hospital Stay

## 2024-02-02 VITALS — BP 122/74 | HR 82 | Temp 97.7°F | Resp 17 | Ht 71.0 in | Wt 185.0 lb

## 2024-02-02 DIAGNOSIS — C3492 Malignant neoplasm of unspecified part of left bronchus or lung: Secondary | ICD-10-CM

## 2024-02-02 DIAGNOSIS — Z5112 Encounter for antineoplastic immunotherapy: Secondary | ICD-10-CM | POA: Diagnosis not present

## 2024-02-02 LAB — CBC WITH DIFFERENTIAL (CANCER CENTER ONLY)
Abs Immature Granulocytes: 0.03 K/uL (ref 0.00–0.07)
Basophils Absolute: 0 K/uL (ref 0.0–0.1)
Basophils Relative: 1 %
Eosinophils Absolute: 0.2 K/uL (ref 0.0–0.5)
Eosinophils Relative: 3 %
HCT: 38.7 % — ABNORMAL LOW (ref 39.0–52.0)
Hemoglobin: 13.7 g/dL (ref 13.0–17.0)
Immature Granulocytes: 1 %
Lymphocytes Relative: 11 %
Lymphs Abs: 0.6 K/uL — ABNORMAL LOW (ref 0.7–4.0)
MCH: 32.6 pg (ref 26.0–34.0)
MCHC: 35.4 g/dL (ref 30.0–36.0)
MCV: 92.1 fL (ref 80.0–100.0)
Monocytes Absolute: 0.4 K/uL (ref 0.1–1.0)
Monocytes Relative: 7 %
Neutro Abs: 4.8 K/uL (ref 1.7–7.7)
Neutrophils Relative %: 77 %
Platelet Count: 236 K/uL (ref 150–400)
RBC: 4.2 MIL/uL — ABNORMAL LOW (ref 4.22–5.81)
RDW: 13 % (ref 11.5–15.5)
WBC Count: 6.1 K/uL (ref 4.0–10.5)
nRBC: 0 % (ref 0.0–0.2)

## 2024-02-02 LAB — CMP (CANCER CENTER ONLY)
ALT: 20 U/L (ref 0–44)
AST: 28 U/L (ref 15–41)
Albumin: 4.6 g/dL (ref 3.5–5.0)
Alkaline Phosphatase: 76 U/L (ref 38–126)
Anion gap: 10 (ref 5–15)
BUN: 20 mg/dL (ref 8–23)
CO2: 27 mmol/L (ref 22–32)
Calcium: 9.5 mg/dL (ref 8.9–10.3)
Chloride: 103 mmol/L (ref 98–111)
Creatinine: 1.31 mg/dL — ABNORMAL HIGH (ref 0.61–1.24)
GFR, Estimated: 60 mL/min — ABNORMAL LOW
Glucose, Bld: 108 mg/dL — ABNORMAL HIGH (ref 70–99)
Potassium: 4.2 mmol/L (ref 3.5–5.1)
Sodium: 141 mmol/L (ref 135–145)
Total Bilirubin: 0.9 mg/dL (ref 0.0–1.2)
Total Protein: 7 g/dL (ref 6.5–8.1)

## 2024-02-02 MED ORDER — SODIUM CHLORIDE 0.9 % IV SOLN: INTRAVENOUS | Status: DC

## 2024-02-02 MED ORDER — SODIUM CHLORIDE 0.9 % IV SOLN
200.0000 mg | Freq: Once | INTRAVENOUS | Status: AC
  Administered 2024-02-02: 200 mg via INTRAVENOUS
  Filled 2024-02-02: qty 200

## 2024-02-02 NOTE — Progress Notes (Signed)
 "     [facility] [phone]   Fax:([phone]  OFFICE PROGRESS NOTE  Tyler Hendrix LABOR, MD [address] [place] [address]  DIAGNOSIS:  Stage IV lung cancer, felt to be poorly differentiated NSCLC. He was diagnosed in October 2024.  He presented with metabolic central left upper lobe lung mass invading the mediastinum, small hypermetabolic right hilar lymph nodes, bilateral adrenal gland masses and splenic lesion consistent metastatic disease, hypermetabolic focus in the right gluteus maximus muscle.    Molecular studies by [employer] show MSI high.  Biomarker Findings Microsatellite status - MSI-High Tumor Mutational Burden - 34 Muts/Mb HRD signature - HRDsig Negative Genomic Findings For a complete list of the genes assayed, please refer to the Appendix. PALB2 F440fs*12 KRAS G12A NF1 I679fs*21 PIK3CA P17S - subclonal, H1047R ASXL1 G645fs*58, K618fs*1 - subclonal MSH2 loss exons 9-16 RAC1 P29H TP53 M237I 7 Disease relevant genes with no reportable alterations: ALK, BRAF, EGFR, ERBB2, MET, RET, ROS1   PDL1 98%.   PRIOR THERAPY: None   CURRENT THERAPY: Immunotherapy with Keytruda  200 mg IV every 3 weeks.  First dose on 12/29/22.  Status post 19 cycles.  INTERVAL HISTORY: Tyler Hendrix 68 y.o. male returns to the clinic today for follow-up visit.Discussed the use of AI scribe software for clinical note transcription with the patient, who gave verbal consent to proceed.  History of Present Illness Tyler Hendrix is a 68 year old male with stage IV non-small cell lung cancer on pembrolizumab  who presents for pre-cycle 20 evaluation and review of recent imaging.  He is receiving pembrolizumab  200 mg IV every three weeks and has completed 19 cycles. He presents for evaluation prior to cycle 20 and to discuss results of a recent CT scan of the chest, abdomen, and pelvis. He reports no new or worsening symptoms related to his  malignancy. He references a prior PET scan with reduction in SUV from 20 to approximately 3 and expresses satisfaction with the absence of new brain metastases.  He describes increased generalized pruritus since the last visit, which he attributes to immunotherapy. The pruritus is diffuse but not severe. He denies rash, diarrhea, or other significant immune-related adverse effects and finds the itching manageable.  He has bilateral inguinal hernias with a knuckle of sigmoid colon extending into one hernia. He previously postponed hernia repair after his cancer diagnosis and has an upcoming surgical consultation. He denies symptoms of incarceration or obstruction and inquires about the impact of immunotherapy on surgical planning.  He is aware that his creatinine is 1.31, unchanged from prior labs. He seeks clarification regarding 'decompressed ureters' noted on imaging and is advised this likely reflects low urine flow rather than obstruction.     MEDICAL HISTORY: Past Medical History:  Diagnosis Date   Abnormal TSH    Arthritis    ddd   Atrial fibrillation and flutter (HCC)    s/p DCCV 03/26/2018   CAD in native artery    a. 11/2015: s/p 2 vessel PCI of the first diagonal branch of the LAD in the mid left circumflex with DES. // s/p CABG 01/2018   Chronic combined systolic and diastolic CHF (congestive heart failure) (HCC)    sees Dr. Kelsie    Complete heart block (HCC)    a. s/p Medtronic ppm 10/2015.   Dyspnea    with exertion   Family history of bladder cancer    Family history of prostate cancer    Family history of stomach  cancer    History of radiation therapy 2002   Hodgkin disease (HCC) 2002   a. s/p chest radiation and chemotherapy   Hypertension    Hypothyroidism    sees Dr. Trixie    Incidental pulmonary nodule 01/19/2018   Left apical opacity - possible scarring   Inguinal hernia 10/14/2022   bilateral   Mitral regurgitation    s/p bioprosthetic MV replacement  01/2018   Non-small cell lung cancer (NSCLC) (HCC) 11/2022   Pacemaker 10/2015   Medtronic   PVC's (premature ventricular contractions)    Right bundle branch block    S/P CABG x 2 01/27/2018   LIMA to LAD, SVG to RCA, EVH via right thigh   S/P mitral valve replacement with bioprosthetic valve 01/27/2018   29 mm [facility] Mitral stented bovine pericardial tissue valve    ALLERGIES:  is allergic to other.  MEDICATIONS:  Current Outpatient Medications  Medication Sig Dispense Refill   aspirin  EC 81 MG tablet Take 81 mg by mouth daily.     atorvastatin  (LIPITOR ) 40 MG tablet Take 1 tablet (40 mg total) by mouth daily. 90 tablet 3   Cholecalciferol  125 MCG (5000 UT) TABS Take 1 tablet by mouth daily. 30 tablet 6   furosemide  (LASIX ) 40 MG tablet Take 1 tablet (40 mg total) by mouth daily as needed for weight gain or dyspnea 30 tablet 2   levothyroxine  (SYNTHROID ) 75 MCG tablet Take 1 tablet (75 mcg total) by mouth daily before breakfast. 90 tablet 3   losartan  (COZAAR ) 25 MG tablet Take 0.5 tablets (12.5 mg total) by mouth at bedtime. 45 tablet 3   tamsulosin  (FLOMAX ) 0.4 MG CAPS capsule Take 1 capsule (0.4 mg total) by mouth daily. 90 capsule 3   tamsulosin  (FLOMAX ) 0.4 MG CAPS capsule Take 1 capsule (0.4 mg total) by mouth 2 (two) times daily. 180 capsule 3   traZODone  (DESYREL ) 50 MG tablet Take 1 tablet (50 mg total) by mouth at bedtime as needed for sleep. 90 tablet 3   No current facility-administered medications for this visit.    SURGICAL HISTORY:  Past Surgical History:  Procedure Laterality Date   A-FLUTTER ABLATION N/A 06/20/2021   Procedure: A-FLUTTER ABLATION;  Surgeon: Waddell Danelle ORN, MD;  Location: [facility] INVASIVE CV LAB;  Service: Cardiovascular;  Laterality: N/A;   BIV UPGRADE N/A 02/08/2019   Procedure: UPGRADE TO BIV PPM;  Surgeon: Kelsie Agent, MD;  Location: [facility] INVASIVE CV LAB;  Service: Cardiovascular;  Laterality: N/A;   BRONCHIAL BIOPSY  12/02/2022    Procedure: BRONCHIAL BIOPSIES;  Surgeon: Shelah Lamar RAMAN, MD;  Location: [facility] ENDOSCOPY;  Service: Pulmonary;;   BRONCHIAL BRUSHINGS  12/02/2022   Procedure: BRONCHIAL BRUSHINGS;  Surgeon: Shelah Lamar RAMAN, MD;  Location: [facility] ENDOSCOPY;  Service: Pulmonary;;   BRONCHIAL NEEDLE ASPIRATION BIOPSY  12/02/2022   Procedure: BRONCHIAL NEEDLE ASPIRATION BIOPSIES;  Surgeon: Shelah Lamar RAMAN, MD;  Location: [facility] ENDOSCOPY;  Service: Pulmonary;;   BRONCHIAL WASHINGS  12/02/2022   Procedure: BRONCHIAL WASHINGS;  Surgeon: Shelah Lamar RAMAN, MD;  Location: [facility] ENDOSCOPY;  Service: Pulmonary;;   CARDIAC CATHETERIZATION N/A 11/02/2015   Procedure: Left Heart Cath and Coronary Angiography;  Surgeon: Ozell Fell, MD;  Location: [facility] INVASIVE CV LAB;  Service: Cardiovascular;  Laterality: N/A;   CARDIAC CATHETERIZATION N/A 11/21/2015   Procedure: Coronary Stent Intervention;  Surgeon: Ozell Fell, MD;  Location: [facility] INVASIVE CV LAB;  Service: Cardiovascular;  Laterality: N/A;   CARDIAC CATHETERIZATION  11/2017   CARDIOVERSION  N/A 03/26/2018   Procedure: CARDIOVERSION;  Surgeon: Loni Soyla LABOR, MD;  Location: [facility] ENDOSCOPY;  Service: Cardiovascular;  Laterality: N/A;   COLONOSCOPY  01/16/2015   per Dr. Teressa, benign polyps, repeat in 10 yrs    CORONARY ARTERY BYPASS GRAFT N/A 01/27/2018   Procedure: CORONARY ARTERY BYPASS GRAFTING (CABG) x two, using left internal mammary artery and right leg greater saphenous vein harvested endoscopically;  Surgeon: Dusty Sudie DEL, MD;  Location: [facility] OR;  Service: Open Heart Surgery;  Laterality: N/A;   DIRECT LARYNGOSCOPY Bilateral 04/20/2023   Procedure: LARYNGOSCOPY, MICRO DIRECT WITH PROLARYN INJECTION;  Surgeon: Carlie Clark, MD;  Location: [facility] OR;  Service: ENT;  Laterality: Bilateral;   EP IMPLANTABLE DEVICE N/A 11/02/2015   MDT Adivisa MRI conditional dual chamber pacemaker implanted by Dr Kelsie for complete heart block   FEMUR IM NAIL Left 07/01/2022   Procedure:  INTRAMEDULLARY (IM) NAIL FEMORAL LEFT ANTEGRADE;  Surgeon: Celena Sharper, MD;  Location: [facility] OR;  Service: Orthopedics;  Laterality: Left;   IR IMAGING GUIDED PORT INSERTION  01/02/2023   KNEE SURGERY Right 1972   LEAD EXTRACTION N/A 10/20/2022   Procedure: LEAD EXTRACTION;  Surgeon: Waddell Danelle ORN, MD;  Location: [facility] INVASIVE CV LAB;  Service: Cardiovascular;  Laterality: N/A;   LUMBAR LAMINECTOMY  1996   MITRAL VALVE REPLACEMENT N/A 01/27/2018   Procedure: MITRAL VALVE (MV) REPLACEMENT using Magna Mitral Ease Valve Size ;  Surgeon: Dusty Sudie DEL, MD;  Location: [facility] OR;  Service: Open Heart Surgery;  Laterality: N/A;   RIGHT/LEFT HEART CATH AND CORONARY ANGIOGRAPHY N/A 12/02/2017   Procedure: RIGHT/LEFT HEART CATH AND CORONARY ANGIOGRAPHY;  Surgeon: Dann Candyce RAMAN, MD;  Location: [facility] INVASIVE CV LAB;  Service: Cardiovascular;  Laterality: N/A;   TEE WITHOUT CARDIOVERSION N/A 12/09/2017   Procedure: TRANSESOPHAGEAL ECHOCARDIOGRAM (TEE);  Surgeon: Lonni Slain, MD;  Location: [facility] ENDOSCOPY;  Service: Cardiovascular;  Laterality: N/A;   TEE WITHOUT CARDIOVERSION N/A 01/27/2018   Procedure: TRANSESOPHAGEAL ECHOCARDIOGRAM (TEE);  Surgeon: Dusty Sudie DEL, MD;  Location: [facility] OR;  Service: Open Heart Surgery;  Laterality: N/A;   VIDEO BRONCHOSCOPY WITH RADIAL ENDOBRONCHIAL ULTRASOUND  12/02/2022   Procedure: VIDEO BRONCHOSCOPY WITH RADIAL ENDOBRONCHIAL ULTRASOUND;  Surgeon: Shelah Lamar RAMAN, MD;  Location: [facility] ENDOSCOPY;  Service: Pulmonary;;    REVIEW OF SYSTEMS:  Constitutional: positive for fatigue Eyes: negative Ears, nose, mouth, throat, and face: negative Respiratory: negative Cardiovascular: negative Gastrointestinal: negative Genitourinary:negative Integument/breast: negative Hematologic/lymphatic: negative Musculoskeletal:negative Neurological: negative Behavioral/Psych: negative Endocrine: negative Allergic/Immunologic: negative   PHYSICAL EXAMINATION: General  appearance: alert, cooperative, fatigued, and no distress Head: Normocephalic, without obvious abnormality, atraumatic Neck: no adenopathy, no JVD, supple, symmetrical, trachea midline, and thyroid  not enlarged, symmetric, no tenderness/mass/nodules Lymph nodes: Cervical, supraclavicular, and axillary nodes normal. Resp: clear to auscultation bilaterally Back: symmetric, no curvature. ROM normal. No CVA tenderness. Cardio: regular rate and rhythm, S1, S2 normal, no murmur, click, rub or gallop GI: soft, non-tender; bowel sounds normal; no masses,  no organomegaly Extremities: extremities normal, atraumatic, no cyanosis or edema Neurologic: Alert and oriented X 3, normal strength and tone. Normal symmetric reflexes. Normal coordination and gait  ECOG PERFORMANCE STATUS: 0 - Asymptomatic  Blood pressure 122/74, pulse 82, temperature 97.7 F (36.5 C), temperature source Temporal, resp. rate 17, height 5' 11 (1.803 m), weight 185 lb (83.9 kg), SpO2 98%.  LABORATORY DATA: Lab Results  Component Value Date   WBC 6.1 02/02/2024   HGB 13.7 02/02/2024   HCT 38.7 (L) 02/02/2024  MCV 92.1 02/02/2024   PLT 236 02/02/2024      Chemistry      Component Value Date/Time   NA 141 02/02/2024 0816   NA 137 06/06/2021 0919   K 4.2 02/02/2024 0816   CL 103 02/02/2024 0816   CO2 27 02/02/2024 0816   BUN 20 02/02/2024 0816   BUN 19 06/06/2021 0919   CREATININE 1.31 (H) 02/02/2024 0816   CREATININE 1.04 01/01/2016 0801      Component Value Date/Time   CALCIUM  9.5 02/02/2024 0816   ALKPHOS 76 02/02/2024 0816   AST 28 02/02/2024 0816   ALT 20 02/02/2024 0816   BILITOT 0.9 02/02/2024 0816       RADIOGRAPHIC STUDIES: CT CHEST ABDOMEN PELVIS W CONTRAST Result Date: 01/31/2024 CLINICAL DATA:  Non-small cell lung cancer.  * Tracking Code: BO * EXAM: CT CHEST, ABDOMEN, AND PELVIS WITH CONTRAST TECHNIQUE: Multidetector CT imaging of the chest, abdomen and pelvis was performed following the  standard protocol during bolus administration of intravenous contrast. RADIATION DOSE REDUCTION: This exam was performed according to the departmental dose-optimization program which includes automated exposure control, adjustment of the mA and/or kV according to patient size and/or use of iterative reconstruction technique. CONTRAST:  OMNIPAQUE  IOHEXOL  300 MG/ML  SOLN COMPARISON:  PET 11/18/2023 and CT chest abdomen pelvis 11/03/2023. FINDINGS: CT CHEST FINDINGS Cardiovascular: Right IJ Port-A-Cath appears to terminate in the low SVC or at the SVC RA junction. Atherosclerotic calcification of the aorta, aortic valve and coronary arteries. Enlarged pulmonic trunk and heart. Left ventricular dilatation. No pericardial effusion. Mitral valve replacement. Mediastinum/Nodes: No pathologically enlarged mediastinal, hilar or axillary lymph nodes. Esophagus is grossly unremarkable. Lungs/Pleura: Calcified granulomas. Tiny juxtapleural nodules are considered benign. Mild dependent atelectasis bilaterally. Nodular consolidation in the juxta mediastinal left upper lobe with surrounding volume loss and architectural distortion, unchanged. No pleural fluid. Airway is unremarkable. Musculoskeletal: Degenerative changes in the spine. No worrisome lytic or sclerotic lesions. CT ABDOMEN PELVIS FINDINGS Hepatobiliary: Liver is unremarkable. Tiny gallstones. No biliary ductal dilatation. Pancreas: Negative. Spleen: Negative. Adrenals/Urinary Tract: Minimal residual adrenal thickening, as before. Kidneys are unremarkable. Ureters are decompressed. Bladder is grossly unremarkable. Stomach/Bowel: Stomach and small bowel are unremarkable. Appendix is not readily visualized. Majority of the colon is unremarkable. A knuckle of sigmoid colon extends into a left inguinal hernia (3/122). Moderate stool burden. Vascular/Lymphatic: Atherosclerotic calcification of the aorta. Gastro Paddock ligament lymph node measures 9 mm (3/63),  previously 11 mm. Mesenteric lymph nodes are not enlarged by CT size criteria. Reproductive: Prostate is borderline enlarged. Other: Small bilateral inguinal hernias contain fat. As mentioned previously, a knuckle of sigmoid colon extends into a left inguinal hernia. No free fluid. Mesenteries and peritoneum are unremarkable. Musculoskeletal: Intramedullary rod in the proximal left femoral shaft. Degenerative changes in the spine. No worrisome lytic or sclerotic lesions. IMPRESSION: 1. Post treatment nodular consolidation in the medial left upper lobe, unchanged. No evidence of disease progression. 2. Minimal residual adrenal thickening, compatible with treated metastases. 3. Cholelithiasis. 4. Bilateral inguinal hernias contain fat as well as a knuckle of sigmoid colon on the left. 5. Aortic atherosclerosis (ICD10-I70.0). Coronary artery calcification. 6. Enlarged pulmonic trunk, indicative of pulmonary arterial hypertension. Electronically Signed   By: Newell Eke M.D.   On: 01/31/2024 11:22   CT HEAD W & WO CONTRAST ( ) Result Date: 01/27/2024 EXAM: CT HEAD WITHOUT AND WITH CONTRAST 01/26/2024 12:42:33 PM TECHNIQUE: CT of the head was performed without and with the administration of 100  mL of iohexol  (OMNIPAQUE ) 300 MG/ML solution. Automated exposure control, iterative reconstruction, and/or weight based adjustment of the mA/kV was utilized to reduce the radiation dose to as low as reasonably achievable. COMPARISON: CT head without and with contrast from last year. CLINICAL HISTORY: 68 year old male with metastatic non-small cell lung cancer. Restaging. Unable to have brain MRI due to pacemaker. FINDINGS: BRAIN AND VENTRICLES: No acute intracranial hemorrhage. No mass effect or midline shift. No extra-axial fluid collection. No evidence of acute infarct. No hydrocephalus. Stable brain volume from last year. Gray-white differentiation stable and within normal limits for age. Subtle chronic lacunar  infarct suspected in the left caudate, stable. Following contrast, no abnormal intracranial enhancement identified. The major intracranial vascular structures are enhancing as expected. Calcified atherosclerosis at the skull base. ORBITS: No acute abnormality. SINUSES AND MASTOIDS: Minor paranasal sinus mucosal thickening appears inconsequential. Tympanic cavities and mastoids remain clear. SOFT TISSUES AND SKULL: No focal bone lesion. No acute soft tissue abnormality. IMPRESSION: 1. No metastatic disease or acute intracranial abnormality. Electronically signed by: Helayne Hurst MD 01/27/2024 09:18 AM EST RP Workstation: [id number]   CUP PACEART REMOTE DEVICE CHECK Result Date: 01/21/2024 Pacemaker: Scheduled remote reviewed. Normal device function.  Presenting rhythm: AS/BiVP 2 NSVT, V>A, 8 & 20 beats, V-rates 167 & 188 bpm Abnormal HF diagnostics this monitoring period Next remote transmission per protocol. ML, CVRS     ASSESSMENT AND PLAN: This is a very pleasant 68 years old white male with stage IV lung cancer, felt to be poorly differentiated NSCLC. He was diagnosed in October 2024.  He presented with metabolic central left upper lobe lung mass invading the mediastinum, small hypermetabolic right hilar lymph nodes, bilateral adrenal gland masses and splenic lesion consistent metastatic disease, hypermetabolic focus in the right gluteus maximus muscle.  His molecular studies by [employer] 1 showed MSI high and PD-L1 expression of 98%.  The patient is currently on treatment with immunotherapy with single agent Keytruda  200 Mg IV every 3 weeks status post 19 cycles. He has been tolerating this treatment fairly well. He had repeat CT scan of the chest, abdomen and pelvis performed recently.  I personally and independently reviewed the scan and discussed the results with the patient and his wife. Assessment and Plan Assessment & Plan Stage IV non-small cell lung cancer of the left lung, MSI-high, PD-L1  98% Disease remains well-controlled on pembrolizumab  immunotherapy, with recent CT and PET imaging confirming stable disease and a marked reduction in metabolic activity (SUV decreased from 19.5 to 3+). Persistent nodular consolidation in the left upper lobe is consistent with scarring, volume loss, and architectural distortion. No evidence of brain metastasis or new symptoms. - Reviewed recent CT and PET scan results, confirming stable disease and significant reduction in metabolic activity. - Proceeded with pre-treatment evaluation for cycle 20 of pembrolizumab .  Immunotherapy-related pruritus He reports mild, generalized pruritus attributed to immune checkpoint inhibitor therapy, without associated rash or other immune-related adverse events. Pruritus may persist even after discontinuation of immunotherapy. - Advised regular skin moisturization, especially during winter, to reduce dryness and pruritus. - Educated that pruritus is a recognized side effect of immunotherapy and may persist post-treatment.  Bilateral inguinal hernias with sigmoid colon involvement Bilateral inguinal hernias are present, with imaging showing sigmoid colon involvement. Surgical consultation is pending to assess need for hernia repair. Immunotherapy does not contraindicate surgical intervention; therapy timing can be adjusted if surgery is required. - Discussed that hernia repair can be performed if indicated and immunotherapy does not  preclude surgery. - Advised that immunotherapy may be delayed by 1-2 weeks if surgery is scheduled to allow for postoperative recovery. - Encouraged follow-up with surgical team and communication regarding operative planning.  Chronic kidney disease, stage 2 Renal function remains well-managed, with stable creatinine at 1.31 and no evidence of acute kidney injury or progression. - Advised increased hydration to support renal function. The patient was advised to call immediately if he  has any other concerning symptoms in the interval.  The patient voices understanding of current disease status and treatment options and is in agreement with the current care plan.  All questions were answered. The patient knows to call the clinic with any problems, questions or concerns. We can certainly see the patient much sooner if necessary.  Disclaimer: This note was dictated with voice recognition software. Similar sounding words can inadvertently be transcribed and may not be corrected upon review.        "

## 2024-02-02 NOTE — Patient Instructions (Signed)

## 2024-02-05 ENCOUNTER — Ambulatory Visit: Payer: Self-pay | Admitting: Surgery

## 2024-02-05 ENCOUNTER — Telehealth: Payer: Self-pay

## 2024-02-05 NOTE — Telephone Encounter (Signed)
"  ° °  Pre-operative Risk Assessment    Patient Name: Tyler Hendrix  DOB: 01-Nov-1955 MRN: [id number]   Date of last office visit: 04/17/23 Date of next office visit: Not scheduled   Request for Surgical Clearance    Procedure:  Hernia repair surgery  Date of Surgery:  Clearance TBD                                Surgeon:  Mitzie Peers  Surgeon's Group or Practice Name:  [facility] Phone number:  [phone] Fax number:  [phone]   Type of Clearance Requested:   - Medical  - Pharmacy:  Hold Aspirin      Type of Anesthesia:  General    Additional requests/questions:    [name]   02/05/2024, 3:40 PM    "

## 2024-02-05 NOTE — H&P (Signed)
 "    Tyler Hendrix [id number]   Referring Provider:  Self   Subjective   Chief Complaint: Follow-up     History of Present Illness:  Returns for follow up of bilateral inguinal hernias.    After our visit last year, he was diagnosed with non-small cell lung cancer stage 4 (mediastinal invasion, adrenal/ splenic/ possible osseous mets) and is undergoing chemo/immunotherapy (keytruda  starting November 2024, disease is currently well-controlled). Per Dr. Jeannett most recent note no contraindication to proceed with hernia repair from immunotherapy standpoint, can delay tx 1-2 weeks for post-op recovery. He follows with Dr. Morene Brownie for chronic heart failure.   09/11/22: This is a very pleasant 68 year old male with history of CAD status post CABG x 2, mitral valve replacement, A-fib/complete heart block status post pacemaker subsequently upgraded to BiV, hypothyroidism, Hodgkin's disease status post radiation and chemo (one of the great sources for his cardiac issues now), chronic combined systolic and diastolic congestive heart failure (EF 30-35% Sept 2023), and arthritis presents for evaluation of bilateral inguinal hernias.  No previous abdominal surgery, but he did just have a left femoral IM nail for femur fracture sustained while doing yard work in the last few months).  He has upcoming lead replacement of his BiV pacer at the end of this month.  He noted bulges in bilateral groins a few months ago, these are asymptomatic at this point.  He is an pensions consultant.  He is fairly active, now especially with the physical therapy for his leg.  He does note that he lost about 14 pounds and had appetite issues after the femur fracture, which seems to be getting better.  Review of Systems: A complete review of systems was obtained from the patient.  I have reviewed this information and discussed as appropriate with the patient.  See HPI as well for other ROS.   Medical History: Past Medical  History:  Diagnosis Date   Arrhythmia    Arthritis    CHF (congestive heart failure) (CMS/HHS-HCC)    Heart valve disease    History of cancer    Hypertension    Thyroid  disease     There is no problem list on file for this patient.   Past Surgical History:  Procedure Laterality Date   A-flutter ablation     back surgery     Cardiac catheterization     Cardioversion     Coronary artery bypass graft surgery     pacemaker insertion surgery     REPLACEMENT MITRAL VALVE     right knee surgery       No Known Allergies  Current Outpatient Medications on File Prior to Visit  Medication Sig Dispense Refill   aspirin  81 MG EC tablet Take 81 mg by mouth once daily     atorvastatin  (LIPITOR ) 40 MG tablet Take 40 mg by mouth once daily     cholecalciferol , vitamin D3, (VITAMIN D3) 125 mcg (5,000 unit) tablet Take 1 tablet by mouth once daily     FUROsemide  (LASIX ) 40 MG tablet Take 40 mg by mouth     levothyroxine  (SYNTHROID ) 75 MCG tablet Take 75 mcg by mouth once daily     losartan  (COZAAR ) 25 MG tablet Take 12.5 mg by mouth at bedtime     tamsulosin  (FLOMAX ) 0.4 mg capsule Take 0.4 mg by mouth     traZODone  (DESYREL ) 50 MG tablet Take 50 mg by mouth at bedtime     carvediloL  (COREG ) 6.25 MG tablet Take  6.25 mg by mouth 2 (two) times daily with meals (Patient not taking: Reported on 02/05/2024)     ENTRESTO  24-26 mg tablet Take 1 tablet by mouth 2 (two) times daily     multivitamin with minerals tablet Take 1 tablet by mouth once daily (Patient not taking: Reported on 02/05/2024)     spironolactone  (ALDACTONE ) 25 MG tablet Take 12.5 mg by mouth once daily     No current facility-administered medications on file prior to visit.    Family History  Problem Relation Age of Onset   Stroke Mother    Coronary Artery Disease (Blocked arteries around heart) Mother    Skin cancer Father    Coronary Artery Disease (Blocked arteries around heart) Father    Obesity Sister    Skin cancer  Brother      Social History   Tobacco Use  Smoking Status Never  Smokeless Tobacco Never     Social History   Socioeconomic History   Marital status: Married  Tobacco Use   Smoking status: Never   Smokeless tobacco: Never  Vaping Use   Vaping status: Never Used  Substance and Sexual Activity   Alcohol use: Yes   Drug use: Never   Social Drivers of Architectural Technologist Insecurity: No Food Insecurity (10/20/2022)   Received from [facility]   Hunger Vital Sign    Within the past 12 months, you worried that your food would run out before you got the money to buy more.: Never true    Within the past 12 months, the food you bought just didn't last and you didn't have money to get more.: Never true  Transportation Needs: No Transportation Needs (10/20/2022)   Received from [facility]    Lack of Transportation (Medical): No    Lack of Transportation (Non-Medical): No  Housing Stability: Unknown (02/05/2024)   Housing Stability Vital Sign    Homeless in the Last Year: No    Objective:    Vitals:   02/05/24 1436  PainSc: 0-No pain     There is no height or weight on file to calculate BMI.  Gen: A&Ox3, no distress  Unlabored respirations There are bilateral likely direct inguinal hernias which are easily reducible  Assessment and Plan:  Diagnoses and all orders for this visit:  Non-recurrent bilateral inguinal hernia without obstruction or gangrene  Bilateral inguinal hernia without obstruction or gangrene, recurrence not specified     We discussed the relevant anatomy and we discussed options for repair.  I recommend a robotic approach and went over the technique of the procedure.  Discussed risks of bleeding, infection, pain, scarring, injury to structures in the area including nerves, blood vessels, bowel, bladder, risk of chronic pain, hernia recurrence, risk of seroma or hematoma, urinary retention, and risks of general anesthesia including  cardiovascular, pulmonary, and thromboembolic complications.  We discussed typical postop recovery, timeline, and activity limitations.  We also discussed the option of ongoing observation, with high rate of ultimately returning for surgery and risk of increasing size/symptoms from the hernia as well as incarceration/strangulation and went over symptoms that should prompt him to seek emergency treatment.  Questions were welcomed and answered to his satisfaction and he wishes to proceed.   Micaylah Bertucci ALAN FREUND, MD   "

## 2024-02-05 NOTE — Telephone Encounter (Signed)
" ° ° °  Primary Cardiologist:Kenneth C Hilty, MD  Chart reviewed as part of pre-operative protocol coverage. Because of Diamond Martucci Blaker's past medical history and time since last visit, he/she will require a follow-up visit in order to better assess preoperative cardiovascular risk.  Pre-op covering staff: - Please schedule appointment and call patient to inform them. - Please contact requesting surgeon's office via preferred method (i.e, phone, fax) to inform them of need for appointment prior to surgery.  Regarding ASA therapy, we recommend continuation of ASA throughout the perioperative period. However, if the surgeon feels that cessation of ASA is required in the perioperative period, it may be stopped 5-7 days prior to surgery with a plan to resume it as soon as felt to be feasible from a surgical standpoint in the post-operative period.     If applicable, this message will also be routed to pharmacy pool and/or primary cardiologist for input on holding anticoagulant/antiplatelet agent as requested below so that this information is available at time of patient's appointment.   Josefa CHRISTELLA Beauvais, NP  02/05/2024, 3:47 PM   "

## 2024-02-05 NOTE — Telephone Encounter (Signed)
 Patient scheduled for pre-op clearance on 03/07/24 with Josefa Beauvais, NP.

## 2024-02-16 ENCOUNTER — Other Ambulatory Visit (HOSPITAL_COMMUNITY): Payer: Self-pay

## 2024-02-16 MED ORDER — TAMSULOSIN HCL 0.4 MG PO CAPS
0.4000 mg | ORAL_CAPSULE | Freq: Two times a day (BID) | ORAL | 2 refills | Status: AC
  Filled 2024-02-16 – 2024-03-07 (×2): qty 90, 45d supply, fill #0

## 2024-02-17 ENCOUNTER — Other Ambulatory Visit: Payer: Self-pay

## 2024-02-17 ENCOUNTER — Other Ambulatory Visit (HOSPITAL_COMMUNITY): Payer: Self-pay

## 2024-02-17 ENCOUNTER — Encounter: Payer: Self-pay | Admitting: Pharmacist

## 2024-02-18 ENCOUNTER — Encounter: Payer: Self-pay | Admitting: Internal Medicine

## 2024-02-18 ENCOUNTER — Other Ambulatory Visit (HOSPITAL_COMMUNITY): Payer: Self-pay

## 2024-02-18 ENCOUNTER — Other Ambulatory Visit: Payer: Self-pay

## 2024-02-23 ENCOUNTER — Inpatient Hospital Stay

## 2024-02-23 ENCOUNTER — Inpatient Hospital Stay: Attending: Physician Assistant

## 2024-02-23 ENCOUNTER — Inpatient Hospital Stay: Admitting: Internal Medicine

## 2024-02-23 VITALS — BP 127/64 | HR 52 | Resp 16

## 2024-02-23 VITALS — BP 129/69 | HR 79 | Temp 97.8°F | Resp 17 | Ht 71.0 in | Wt 167.0 lb

## 2024-02-23 DIAGNOSIS — C3492 Malignant neoplasm of unspecified part of left bronchus or lung: Secondary | ICD-10-CM

## 2024-02-23 DIAGNOSIS — I110 Hypertensive heart disease with heart failure: Secondary | ICD-10-CM | POA: Diagnosis not present

## 2024-02-23 DIAGNOSIS — Z79899 Other long term (current) drug therapy: Secondary | ICD-10-CM | POA: Insufficient documentation

## 2024-02-23 DIAGNOSIS — Z8052 Family history of malignant neoplasm of bladder: Secondary | ICD-10-CM | POA: Insufficient documentation

## 2024-02-23 DIAGNOSIS — Z923 Personal history of irradiation: Secondary | ICD-10-CM | POA: Insufficient documentation

## 2024-02-23 DIAGNOSIS — C3412 Malignant neoplasm of upper lobe, left bronchus or lung: Secondary | ICD-10-CM | POA: Diagnosis present

## 2024-02-23 DIAGNOSIS — Z7982 Long term (current) use of aspirin: Secondary | ICD-10-CM | POA: Diagnosis not present

## 2024-02-23 DIAGNOSIS — I5042 Chronic combined systolic (congestive) and diastolic (congestive) heart failure: Secondary | ICD-10-CM | POA: Insufficient documentation

## 2024-02-23 DIAGNOSIS — Z8042 Family history of malignant neoplasm of prostate: Secondary | ICD-10-CM | POA: Insufficient documentation

## 2024-02-23 DIAGNOSIS — Z5112 Encounter for antineoplastic immunotherapy: Secondary | ICD-10-CM | POA: Diagnosis present

## 2024-02-23 DIAGNOSIS — Z8571 Personal history of Hodgkin lymphoma: Secondary | ICD-10-CM | POA: Insufficient documentation

## 2024-02-23 DIAGNOSIS — Z800 Family history of malignant neoplasm of digestive organs: Secondary | ICD-10-CM | POA: Insufficient documentation

## 2024-02-23 DIAGNOSIS — Z9221 Personal history of antineoplastic chemotherapy: Secondary | ICD-10-CM | POA: Diagnosis not present

## 2024-02-23 DIAGNOSIS — C7971 Secondary malignant neoplasm of right adrenal gland: Secondary | ICD-10-CM | POA: Diagnosis not present

## 2024-02-23 DIAGNOSIS — I4891 Unspecified atrial fibrillation: Secondary | ICD-10-CM | POA: Diagnosis not present

## 2024-02-23 LAB — CBC WITH DIFFERENTIAL (CANCER CENTER ONLY)
Abs Immature Granulocytes: 0.02 K/uL (ref 0.00–0.07)
Basophils Absolute: 0 K/uL (ref 0.0–0.1)
Basophils Relative: 1 %
Eosinophils Absolute: 0.4 K/uL (ref 0.0–0.5)
Eosinophils Relative: 6 %
HCT: 38.7 % — ABNORMAL LOW (ref 39.0–52.0)
Hemoglobin: 13.5 g/dL (ref 13.0–17.0)
Immature Granulocytes: 0 %
Lymphocytes Relative: 12 %
Lymphs Abs: 0.7 K/uL (ref 0.7–4.0)
MCH: 32.3 pg (ref 26.0–34.0)
MCHC: 34.9 g/dL (ref 30.0–36.0)
MCV: 92.6 fL (ref 80.0–100.0)
Monocytes Absolute: 0.4 K/uL (ref 0.1–1.0)
Monocytes Relative: 8 %
Neutro Abs: 4.3 K/uL (ref 1.7–7.7)
Neutrophils Relative %: 73 %
Platelet Count: 213 K/uL (ref 150–400)
RBC: 4.18 MIL/uL — ABNORMAL LOW (ref 4.22–5.81)
RDW: 13.2 % (ref 11.5–15.5)
WBC Count: 5.9 K/uL (ref 4.0–10.5)
nRBC: 0 % (ref 0.0–0.2)

## 2024-02-23 LAB — CMP (CANCER CENTER ONLY)
ALT: 20 U/L (ref 0–44)
AST: 28 U/L (ref 15–41)
Albumin: 4.3 g/dL (ref 3.5–5.0)
Alkaline Phosphatase: 77 U/L (ref 38–126)
Anion gap: 11 (ref 5–15)
BUN: 18 mg/dL (ref 8–23)
CO2: 26 mmol/L (ref 22–32)
Calcium: 9.3 mg/dL (ref 8.9–10.3)
Chloride: 103 mmol/L (ref 98–111)
Creatinine: 1.33 mg/dL — ABNORMAL HIGH (ref 0.61–1.24)
GFR, Estimated: 59 mL/min — ABNORMAL LOW
Glucose, Bld: 105 mg/dL — ABNORMAL HIGH (ref 70–99)
Potassium: 4.3 mmol/L (ref 3.5–5.1)
Sodium: 140 mmol/L (ref 135–145)
Total Bilirubin: 0.9 mg/dL (ref 0.0–1.2)
Total Protein: 6.9 g/dL (ref 6.5–8.1)

## 2024-02-23 LAB — TSH: TSH: 5.95 u[IU]/mL — ABNORMAL HIGH (ref 0.350–4.500)

## 2024-02-23 MED ORDER — SODIUM CHLORIDE 0.9 % IV SOLN
200.0000 mg | Freq: Once | INTRAVENOUS | Status: AC
  Administered 2024-02-23: 200 mg via INTRAVENOUS
  Filled 2024-02-23: qty 200

## 2024-02-23 MED ORDER — SODIUM CHLORIDE 0.9 % IV SOLN: INTRAVENOUS | Status: DC

## 2024-02-23 NOTE — Progress Notes (Signed)
 "     [facility] [phone]   [phone]  OFFICE PROGRESS NOTE  Tyler Hendrix LABOR, MD [address] [place] [address]  DIAGNOSIS:  Stage IV lung cancer, felt to be poorly differentiated NSCLC. He was diagnosed in October 2024.  He presented with metabolic central left upper lobe lung mass invading the mediastinum, small hypermetabolic right hilar lymph nodes, bilateral adrenal gland masses and splenic lesion consistent metastatic disease, hypermetabolic focus in the right gluteus maximus muscle.    Molecular studies by [employer] show MSI high.  Biomarker Findings Microsatellite status - MSI-High Tumor Mutational Burden - 9 Muts/Mb HRD signature - HRDsig Negative Genomic Findings For a complete list of the genes assayed, please refer to the Appendix. PALB2 F440fs*12 KRAS G12A NF1 I679fs*21 PIK3CA P17S - subclonal, H1047R ASXL1 G645fs*58, K618fs*1 - subclonal MSH2 loss exons 9-16 RAC1 P29H TP53 M237I 7 Disease relevant genes with no reportable alterations: ALK, BRAF, EGFR, ERBB2, MET, RET, ROS1   PDL1 98%.   PRIOR THERAPY: None   CURRENT THERAPY: Immunotherapy with Keytruda  200 mg IV every 3 weeks.  First dose on 12/29/22.  Status post 20 cycles.  INTERVAL HISTORY: Tyler Hendrix 69 y.o. male returns to the clinic today for follow-up visit accompanied by his wife.Discussed the use of AI scribe software for clinical note transcription with the patient, who gave verbal consent to proceed.  History of Present Illness Tyler Hendrix is a 69 year old male with stage IV non-small cell lung cancer receiving pembrolizumab  who presents for evaluation prior to cycle 21 of immunotherapy.  He was diagnosed with stage IV non-small cell lung cancer in October 2024, characterized by MSI-high status and PD-L1 expression of 98%. He is currently receiving pembrolizumab  200 mg IV every three weeks and has completed 20 cycles.  He reports  feeling well overall with no new complaints. He denies chest pain, dyspnea, nausea, or vomiting.  He continues to experience pruritus, predominantly on the legs, and describes a mild sore rash. These symptoms are stable, mild, and do not interfere with daily activities.  He is scheduled for outpatient inguinal hernia repair on February 18th.    MEDICAL HISTORY: Past Medical History:  Diagnosis Date   Abnormal TSH    Arthritis    ddd   Atrial fibrillation and flutter (HCC)    s/p DCCV 03/26/2018   CAD in native artery    a. 11/2015: s/p 2 vessel PCI of the first diagonal branch of the LAD in the mid left circumflex with DES. // s/p CABG 01/2018   Chronic combined systolic and diastolic CHF (congestive heart failure) (HCC)    sees Dr. Kelsie    Complete heart block (HCC)    a. s/p Medtronic ppm 10/2015.   Dyspnea    with exertion   Family history of bladder cancer    Family history of prostate cancer    Family history of stomach cancer    History of radiation therapy 2002   Hodgkin disease (HCC) 2002   a. s/p chest radiation and chemotherapy   Hypertension    Hypothyroidism    sees Dr. Trixie    Incidental pulmonary nodule 01/19/2018   Left apical opacity - possible scarring   Inguinal hernia 10/14/2022   bilateral   Mitral regurgitation    s/p bioprosthetic MV replacement 01/2018   Non-small cell lung cancer (NSCLC) (HCC) 11/2022   Pacemaker 10/2015   Medtronic   PVC's (premature ventricular contractions)  Right bundle branch block    S/P CABG x 2 01/27/2018   LIMA to LAD, SVG to RCA, EVH via right thigh   S/P mitral valve replacement with bioprosthetic valve 01/27/2018   29 mm [facility] Mitral stented bovine pericardial tissue valve    ALLERGIES:  is allergic to other.  MEDICATIONS:  Current Outpatient Medications  Medication Sig Dispense Refill   aspirin  EC 81 MG tablet Take 81 mg by mouth daily.     atorvastatin  (LIPITOR ) 40 MG tablet Take 1 tablet (40  mg total) by mouth daily. 90 tablet 3   Cholecalciferol  125 MCG (5000 UT) TABS Take 1 tablet by mouth daily. 30 tablet 6   furosemide  (LASIX ) 40 MG tablet Take 1 tablet (40 mg total) by mouth daily as needed for weight gain or dyspnea 30 tablet 2   levothyroxine  (SYNTHROID ) 75 MCG tablet Take 1 tablet (75 mcg total) by mouth daily before breakfast. 90 tablet 3   losartan  (COZAAR ) 25 MG tablet Take 1/2 tablet (12.5 mg total) by mouth at bedtime. 45 tablet 3   tamsulosin  (FLOMAX ) 0.4 MG CAPS capsule Take 1 capsule (0.4 mg total) by mouth daily. 90 capsule 3   tamsulosin  (FLOMAX ) 0.4 MG CAPS capsule Take 1 capsule (0.4 mg total) by mouth 2 (two) times daily. 180 capsule 3   tamsulosin  (FLOMAX ) 0.4 MG CAPS capsule Take 1 capsule (0.4 mg total) by mouth 2 (two) times daily. 90 capsule 2   traZODone  (DESYREL ) 50 MG tablet Take 1 tablet (50 mg total) by mouth at bedtime as needed for sleep. 90 tablet 3   No current facility-administered medications for this visit.    SURGICAL HISTORY:  Past Surgical History:  Procedure Laterality Date   A-FLUTTER ABLATION N/A 06/20/2021   Procedure: A-FLUTTER ABLATION;  Surgeon: Waddell Danelle ORN, MD;  Location: [facility] INVASIVE CV LAB;  Service: Cardiovascular;  Laterality: N/A;   BIV UPGRADE N/A 02/08/2019   Procedure: UPGRADE TO BIV PPM;  Surgeon: Kelsie Agent, MD;  Location: [facility] INVASIVE CV LAB;  Service: Cardiovascular;  Laterality: N/A;   BRONCHIAL BIOPSY  12/02/2022   Procedure: BRONCHIAL BIOPSIES;  Surgeon: Shelah Lamar RAMAN, MD;  Location: [facility] ENDOSCOPY;  Service: Pulmonary;;   BRONCHIAL BRUSHINGS  12/02/2022   Procedure: BRONCHIAL BRUSHINGS;  Surgeon: Shelah Lamar RAMAN, MD;  Location: [facility] ENDOSCOPY;  Service: Pulmonary;;   BRONCHIAL NEEDLE ASPIRATION BIOPSY  12/02/2022   Procedure: BRONCHIAL NEEDLE ASPIRATION BIOPSIES;  Surgeon: Shelah Lamar RAMAN, MD;  Location: [facility] ENDOSCOPY;  Service: Pulmonary;;   BRONCHIAL WASHINGS  12/02/2022   Procedure: BRONCHIAL WASHINGS;   Surgeon: Shelah Lamar RAMAN, MD;  Location: [facility] ENDOSCOPY;  Service: Pulmonary;;   CARDIAC CATHETERIZATION N/A 11/02/2015   Procedure: Left Heart Cath and Coronary Angiography;  Surgeon: Ozell Fell, MD;  Location: [facility] INVASIVE CV LAB;  Service: Cardiovascular;  Laterality: N/A;   CARDIAC CATHETERIZATION N/A 11/21/2015   Procedure: Coronary Stent Intervention;  Surgeon: Ozell Fell, MD;  Location: [facility] INVASIVE CV LAB;  Service: Cardiovascular;  Laterality: N/A;   CARDIAC CATHETERIZATION  11/2017   CARDIOVERSION N/A 03/26/2018   Procedure: CARDIOVERSION;  Surgeon: Loni Soyla LABOR, MD;  Location: [facility] ENDOSCOPY;  Service: Cardiovascular;  Laterality: N/A;   COLONOSCOPY  01/16/2015   per Dr. Teressa, benign polyps, repeat in 10 yrs    CORONARY ARTERY BYPASS GRAFT N/A 01/27/2018   Procedure: CORONARY ARTERY BYPASS GRAFTING (CABG) x two, using left internal mammary artery and right leg greater saphenous vein harvested endoscopically;  Surgeon: Dusty,  Sudie DEL, MD;  Location: [facility] OR;  Service: Open Heart Surgery;  Laterality: N/A;   DIRECT LARYNGOSCOPY Bilateral 04/20/2023   Procedure: LARYNGOSCOPY, MICRO DIRECT WITH PROLARYN INJECTION;  Surgeon: Carlie Clark, MD;  Location: [facility] OR;  Service: ENT;  Laterality: Bilateral;   EP IMPLANTABLE DEVICE N/A 11/02/2015   MDT Adivisa MRI conditional dual chamber pacemaker implanted by Dr Kelsie for complete heart block   FEMUR IM NAIL Left 07/01/2022   Procedure: INTRAMEDULLARY (IM) NAIL FEMORAL LEFT ANTEGRADE;  Surgeon: Celena Sharper, MD;  Location: [facility] OR;  Service: Orthopedics;  Laterality: Left;   IR IMAGING GUIDED PORT INSERTION  01/02/2023   KNEE SURGERY Right 1972   LEAD EXTRACTION N/A 10/20/2022   Procedure: LEAD EXTRACTION;  Surgeon: Waddell Danelle ORN, MD;  Location: [facility] INVASIVE CV LAB;  Service: Cardiovascular;  Laterality: N/A;   LUMBAR LAMINECTOMY  1996   MITRAL VALVE REPLACEMENT N/A 01/27/2018   Procedure: MITRAL VALVE (MV) REPLACEMENT using Magna Mitral  Ease Valve Size ;  Surgeon: Dusty Sudie DEL, MD;  Location: [facility] OR;  Service: Open Heart Surgery;  Laterality: N/A;   RIGHT/LEFT HEART CATH AND CORONARY ANGIOGRAPHY N/A 12/02/2017   Procedure: RIGHT/LEFT HEART CATH AND CORONARY ANGIOGRAPHY;  Surgeon: Dann Candyce RAMAN, MD;  Location: [facility] INVASIVE CV LAB;  Service: Cardiovascular;  Laterality: N/A;   TEE WITHOUT CARDIOVERSION N/A 12/09/2017   Procedure: TRANSESOPHAGEAL ECHOCARDIOGRAM (TEE);  Surgeon: Lonni Slain, MD;  Location: [facility] ENDOSCOPY;  Service: Cardiovascular;  Laterality: N/A;   TEE WITHOUT CARDIOVERSION N/A 01/27/2018   Procedure: TRANSESOPHAGEAL ECHOCARDIOGRAM (TEE);  Surgeon: Dusty Sudie DEL, MD;  Location: [facility] OR;  Service: Open Heart Surgery;  Laterality: N/A;   VIDEO BRONCHOSCOPY WITH RADIAL ENDOBRONCHIAL ULTRASOUND  12/02/2022   Procedure: VIDEO BRONCHOSCOPY WITH RADIAL ENDOBRONCHIAL ULTRASOUND;  Surgeon: Shelah Lamar RAMAN, MD;  Location: [facility] ENDOSCOPY;  Service: Pulmonary;;    REVIEW OF SYSTEMS:  A comprehensive review of systems was negative except for: Integument/breast: positive for pruritus   PHYSICAL EXAMINATION: General appearance: alert, cooperative, and no distress Head: Normocephalic, without obvious abnormality, atraumatic Neck: no adenopathy, no JVD, supple, symmetrical, trachea midline, and thyroid  not enlarged, symmetric, no tenderness/mass/nodules Lymph nodes: Cervical, supraclavicular, and axillary nodes normal. Resp: clear to auscultation bilaterally Back: symmetric, no curvature. ROM normal. No CVA tenderness. Cardio: regular rate and rhythm, S1, S2 normal, no murmur, click, rub or gallop GI: soft, non-tender; bowel sounds normal; no masses,  no organomegaly Extremities: extremities normal, atraumatic, no cyanosis or edema  ECOG PERFORMANCE STATUS: 0 - Asymptomatic  Blood pressure 129/69, pulse 79, temperature 97.8 F (36.6 C), temperature source Temporal, resp. rate 17, height 5' 11 (1.803 m),  weight 167 lb (75.8 kg), SpO2 97%.  LABORATORY DATA: Lab Results  Component Value Date   WBC 5.9 02/23/2024   HGB 13.5 02/23/2024   HCT 38.7 (L) 02/23/2024   MCV 92.6 02/23/2024   PLT 213 02/23/2024      Chemistry      Component Value Date/Time   NA 141 02/02/2024 0816   NA 137 06/06/2021 0919   K 4.2 02/02/2024 0816   CL 103 02/02/2024 0816   CO2 27 02/02/2024 0816   BUN 20 02/02/2024 0816   BUN 19 06/06/2021 0919   CREATININE 1.31 (H) 02/02/2024 0816   CREATININE 1.04 01/01/2016 0801      Component Value Date/Time   CALCIUM  9.5 02/02/2024 0816   ALKPHOS 76 02/02/2024 0816   AST 28 02/02/2024 0816   ALT 20 02/02/2024 0816  BILITOT 0.9 02/02/2024 0816       RADIOGRAPHIC STUDIES: CT CHEST ABDOMEN PELVIS W CONTRAST Result Date: 01/31/2024 CLINICAL DATA:  Non-small cell lung cancer.  * Tracking Code: BO * EXAM: CT CHEST, ABDOMEN, AND PELVIS WITH CONTRAST TECHNIQUE: Multidetector CT imaging of the chest, abdomen and pelvis was performed following the standard protocol during bolus administration of intravenous contrast. RADIATION DOSE REDUCTION: This exam was performed according to the departmental dose-optimization program which includes automated exposure control, adjustment of the mA and/or kV according to patient size and/or use of iterative reconstruction technique. CONTRAST:  OMNIPAQUE  IOHEXOL  300 MG/ML  SOLN COMPARISON:  PET 11/18/2023 and CT chest abdomen pelvis 11/03/2023. FINDINGS: CT CHEST FINDINGS Cardiovascular: Right IJ Port-A-Cath appears to terminate in the low SVC or at the SVC RA junction. Atherosclerotic calcification of the aorta, aortic valve and coronary arteries. Enlarged pulmonic trunk and heart. Left ventricular dilatation. No pericardial effusion. Mitral valve replacement. Mediastinum/Nodes: No pathologically enlarged mediastinal, hilar or axillary lymph nodes. Esophagus is grossly unremarkable. Lungs/Pleura: Calcified granulomas. Tiny juxtapleural  nodules are considered benign. Mild dependent atelectasis bilaterally. Nodular consolidation in the juxta mediastinal left upper lobe with surrounding volume loss and architectural distortion, unchanged. No pleural fluid. Airway is unremarkable. Musculoskeletal: Degenerative changes in the spine. No worrisome lytic or sclerotic lesions. CT ABDOMEN PELVIS FINDINGS Hepatobiliary: Liver is unremarkable. Tiny gallstones. No biliary ductal dilatation. Pancreas: Negative. Spleen: Negative. Adrenals/Urinary Tract: Minimal residual adrenal thickening, as before. Kidneys are unremarkable. Ureters are decompressed. Bladder is grossly unremarkable. Stomach/Bowel: Stomach and small bowel are unremarkable. Appendix is not readily visualized. Majority of the colon is unremarkable. A knuckle of sigmoid colon extends into a left inguinal hernia (3/122). Moderate stool burden. Vascular/Lymphatic: Atherosclerotic calcification of the aorta. Gastro Paddock ligament lymph node measures 9 mm (3/63), previously 11 mm. Mesenteric lymph nodes are not enlarged by CT size criteria. Reproductive: Prostate is borderline enlarged. Other: Small bilateral inguinal hernias contain fat. As mentioned previously, a knuckle of sigmoid colon extends into a left inguinal hernia. No free fluid. Mesenteries and peritoneum are unremarkable. Musculoskeletal: Intramedullary rod in the proximal left femoral shaft. Degenerative changes in the spine. No worrisome lytic or sclerotic lesions. IMPRESSION: 1. Post treatment nodular consolidation in the medial left upper lobe, unchanged. No evidence of disease progression. 2. Minimal residual adrenal thickening, compatible with treated metastases. 3. Cholelithiasis. 4. Bilateral inguinal hernias contain fat as well as a knuckle of sigmoid colon on the left. 5. Aortic atherosclerosis (ICD10-I70.0). Coronary artery calcification. 6. Enlarged pulmonic trunk, indicative of pulmonary arterial hypertension. Electronically  Signed   By: Newell Eke M.D.   On: 01/31/2024 11:22   CT HEAD W & WO CONTRAST ( ) Result Date: 01/27/2024 EXAM: CT HEAD WITHOUT AND WITH CONTRAST 01/26/2024 12:42:33 PM TECHNIQUE: CT of the head was performed without and with the administration of 100 mL of iohexol  (OMNIPAQUE ) 300 MG/ML solution. Automated exposure control, iterative reconstruction, and/or weight based adjustment of the mA/kV was utilized to reduce the radiation dose to as low as reasonably achievable. COMPARISON: CT head without and with contrast from last year. CLINICAL HISTORY: 69 year old male with metastatic non-small cell lung cancer. Restaging. Unable to have brain MRI due to pacemaker. FINDINGS: BRAIN AND VENTRICLES: No acute intracranial hemorrhage. No mass effect or midline shift. No extra-axial fluid collection. No evidence of acute infarct. No hydrocephalus. Stable brain volume from last year. Gray-white differentiation stable and within normal limits for age. Subtle chronic lacunar infarct suspected in the left caudate, stable.  Following contrast, no abnormal intracranial enhancement identified. The major intracranial vascular structures are enhancing as expected. Calcified atherosclerosis at the skull base. ORBITS: No acute abnormality. SINUSES AND MASTOIDS: Minor paranasal sinus mucosal thickening appears inconsequential. Tympanic cavities and mastoids remain clear. SOFT TISSUES AND SKULL: No focal bone lesion. No acute soft tissue abnormality. IMPRESSION: 1. No metastatic disease or acute intracranial abnormality. Electronically signed by: Helayne Hurst MD 01/27/2024 09:18 AM EST RP Workstation: [id number]      ASSESSMENT AND PLAN: This is a very pleasant 69 years old white male with stage IV lung cancer, felt to be poorly differentiated NSCLC. He was diagnosed in October 2024.  He presented with metabolic central left upper lobe lung mass invading the mediastinum, small hypermetabolic right hilar lymph nodes, bilateral  adrenal gland masses and splenic lesion consistent metastatic disease, hypermetabolic focus in the right gluteus maximus muscle.  His molecular studies by [employer] showed MSI high and PD-L1 expression of 98%.  The patient is currently on treatment with immunotherapy with single agent Keytruda  200 Mg IV every 3 weeks status post 20 cycles. He has been tolerating this treatment fairly well. Assessment and Plan Assessment & Plan Stage IV non-small cell lung cancer Metastatic non-small cell lung cancer with MSI-high and high PD-L1 expression, currently well-controlled on pembrolizumab  immunotherapy. He remains clinically stable without new complaints or significant adverse effects. Mild pruritus and rash persist but are not limiting. Laboratory evaluation shows normal CBC; chemistry panel is pending. He is scheduled for hernia surgery, and immunotherapy timing may require adjustment to accommodate postoperative recovery. - Administer cycle 21 of pembrolizumab  (Keytruda ) as scheduled. - Reviewed CBC; chemistry panel results pending. - Coordinate timing of next immunotherapy cycle with hernia surgery; consider delaying treatment by one week postoperatively to facilitate healing. - Instructed him to notify if a treatment delay is required at the next visit. - Follow up in three weeks. The patient was advised to call immediately if he has any concerning symptoms in the interval. The patient voices understanding of current disease status and treatment options and is in agreement with the current care plan.  All questions were answered. The patient knows to call the clinic with any problems, questions or concerns. We can certainly see the patient much sooner if necessary.  Disclaimer: This note was dictated with voice recognition software. Similar sounding words can inadvertently be transcribed and may not be corrected upon review.        "

## 2024-02-23 NOTE — Patient Instructions (Signed)

## 2024-02-24 LAB — T4: T4, Total: 6.8 ug/dL (ref 4.5–12.0)

## 2024-03-01 ENCOUNTER — Ambulatory Visit (INDEPENDENT_AMBULATORY_CARE_PROVIDER_SITE_OTHER): Admitting: Internal Medicine

## 2024-03-01 ENCOUNTER — Encounter: Payer: Self-pay | Admitting: Internal Medicine

## 2024-03-01 ENCOUNTER — Other Ambulatory Visit: Payer: Self-pay

## 2024-03-01 VITALS — BP 120/82 | HR 60 | Ht 71.0 in | Wt 187.8 lb

## 2024-03-01 DIAGNOSIS — E039 Hypothyroidism, unspecified: Secondary | ICD-10-CM

## 2024-03-01 MED ORDER — LEVOTHYROXINE SODIUM 100 MCG PO TABS
100.0000 ug | ORAL_TABLET | Freq: Every day | ORAL | 6 refills | Status: AC
  Filled 2024-03-01: qty 45, 45d supply, fill #0

## 2024-03-01 NOTE — Patient Instructions (Addendum)
 Please increase Levothyroxine  100 mcg daily.  Take the thyroid  hormone every day, with water, at least 30 minutes before breakfast, separated by at least 4 hours from: - acid reflux medications - calcium  - iron - multivitamins  Please come back for labs in 1.5 months.  Please come back for a follow-up appointment in 1 year.

## 2024-03-01 NOTE — Progress Notes (Signed)
 Patient ID: Tyler Hendrix, male   DOB: Jul 01, 1955, 69 y.o.   MRN: [id number]   HPI  Tyler Hendrix is a 69 y.o.-year-old male, returning for follow-up for hypothyroidism.  Last visit 1 year and 3 months ago.  Interim history: Since last visit, he was diagnosed with stage 4 non-small cell lung cancer. No ChTx or RxTx planned. He started Keytruda  12/2022.  He is also taking over-the-counter supplements.  No increased SOB, CP.   Reviewed history: Pt. has been dx with hypothyroidism in ~2010. He has been found to have a high TSH during investigation for AVB.  He started to feel better after starting levothyroxine .  Reviewed his TFTs: Lab Results  Component Value Date   TSH 5.950 (H) 02/23/2024   TSH 6.000 (H) 12/22/2023   TSH 7.340 (H) 10/19/2023   TSH 4.430 08/17/2023   TSH 6.450 (H) 06/16/2023   TSH 4.856 (H) 04/13/2023   TSH 4.782 (H) 02/09/2023   TSH 3.99 12/31/2022   TSH 2.951 12/29/2022   TSH 4.18 08/19/2022   FREET4 0.84 12/31/2022   FREET4 0.87 08/19/2022   FREET4 0.99 05/24/2021   FREET4 0.84 05/18/2020   FREET4 0.86 05/18/2019   FREET4 0.92 03/30/2019   FREET4 0.96 05/12/2017   FREET4 1.14 09/11/2016   FREET4 0.79 07/03/2016   FREET4 0.87 04/16/2016    Pt is on levothyroxine  75 mcg daily, taken: - in am (7 am)  - fasting - at least 30 min from b'fast - no Ca, Fe, PPIs - + MVI and herbal + mineral suplements in pm >> in am >> later in the day - not on Biotin  Pt denies: - feeling nodules in neck - hoarseness - dysphagia - choking  Of note, he does have a history of radiation treatment in the head and neck area in 2002. No FH of thyroid  disease or thyroid  cancer.  No Biotin use. No recent steroids use.   Pt. 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 He had RxTx and ChTx in 2002 for Hodgkin Lymphoma. He had MV replacement, CABG 01/2018, then admitted with acute on chronic CHF on 03/27/2018. EF 25-30%.   He has a history of A.  fib/a flutter and had cardioversions in the past.  He has a biventricular pacemaker. 2D Echo 2021: 30-35%, prev. 40% in 2020. He had a femoral fracture in 06/2022 - had an intramedullary rod placed. He had a pacemaker implanted 10/2022.  ROS: + see HPI  I reviewed pt's medications, allergies, PMH, social hx, family hx, and changes were documented in the history of present illness. Otherwise, unchanged from my initial visit note.  Past Medical History:  Diagnosis Date   Abnormal TSH    Arthritis    ddd   Atrial fibrillation and flutter (HCC)    s/p DCCV 03/26/2018   CAD in native artery    a. 11/2015: s/p 2 vessel PCI of the first diagonal branch of the LAD in the mid left circumflex with DES. // s/p CABG 01/2018   Chronic combined systolic and diastolic CHF (congestive heart failure) (HCC)    sees Dr. Kelsie    Complete heart block (HCC)    a. s/p Medtronic ppm 10/2015.   Dyspnea    with exertion   Family history of bladder cancer    Family history of prostate cancer    Family history of stomach cancer    History of radiation therapy 2002   Hodgkin  disease (HCC) 2002   a. s/p chest radiation and chemotherapy   Hypertension    Hypothyroidism    sees Dr. Trixie    Incidental pulmonary nodule 01/19/2018   Left apical opacity - possible scarring   Inguinal hernia 10/14/2022   bilateral   Mitral regurgitation    s/p bioprosthetic MV replacement 01/2018   Non-small cell lung cancer (NSCLC) (HCC) 11/2022   Pacemaker 10/2015   Medtronic   PVC's (premature ventricular contractions)    Right bundle branch block    S/P CABG x 2 01/27/2018   LIMA to LAD, SVG to RCA, EVH via right thigh   S/P mitral valve replacement with bioprosthetic valve 01/27/2018   29 mm [facility] Mitral stented bovine pericardial tissue valve   Past Surgical History:  Procedure Laterality Date   A-FLUTTER ABLATION N/A 06/20/2021   Procedure: A-FLUTTER ABLATION;  Surgeon: Waddell Danelle ORN, MD;  Location:  [facility] INVASIVE CV LAB;  Service: Cardiovascular;  Laterality: N/A;   BIV UPGRADE N/A 02/08/2019   Procedure: UPGRADE TO BIV PPM;  Surgeon: Kelsie Agent, MD;  Location: [facility];  Service: Cardiovascular;  Laterality: N/A;   BRONCHIAL BIOPSY  12/02/2022   Procedure: BRONCHIAL BIOPSIES;  Surgeon: Shelah Lamar RAMAN, MD;  Location: [facility] ENDOSCOPY;  Service: Pulmonary;;   BRONCHIAL BRUSHINGS  12/02/2022   Procedure: BRONCHIAL BRUSHINGS;  Surgeon: Shelah Lamar RAMAN, MD;  Location: [facility] ENDOSCOPY;  Service: Pulmonary;;   BRONCHIAL NEEDLE ASPIRATION BIOPSY  12/02/2022   Procedure: BRONCHIAL NEEDLE ASPIRATION BIOPSIES;  Surgeon: Shelah Lamar RAMAN, MD;  Location: [facility] ENDOSCOPY;  Service: Pulmonary;;   BRONCHIAL WASHINGS  12/02/2022   Procedure: BRONCHIAL WASHINGS;  Surgeon: Shelah Lamar RAMAN, MD;  Location: [facility] ENDOSCOPY;  Service: Pulmonary;;   CARDIAC CATHETERIZATION N/A 11/02/2015   Procedure: Left Heart Cath and Coronary Angiography;  Surgeon: Ozell Fell, MD;  Location: [facility] INVASIVE CV LAB;  Service: Cardiovascular;  Laterality: N/A;   CARDIAC CATHETERIZATION N/A 11/21/2015   Procedure: Coronary Stent Intervention;  Surgeon: Ozell Fell, MD;  Location: [facility] INVASIVE CV LAB;  Service: Cardiovascular;  Laterality: N/A;   CARDIAC CATHETERIZATION  11/2017   CARDIOVERSION N/A 03/26/2018   Procedure: CARDIOVERSION;  Surgeon: Loni Soyla LABOR, MD;  Location: [facility] ENDOSCOPY;  Service: Cardiovascular;  Laterality: N/A;   COLONOSCOPY  01/16/2015   per Dr. Teressa, benign polyps, repeat in 10 yrs    CORONARY ARTERY BYPASS GRAFT N/A 01/27/2018   Procedure: CORONARY ARTERY BYPASS GRAFTING (CABG) x two, using left internal mammary artery and right leg greater saphenous vein harvested endoscopically;  Surgeon: Dusty Sudie DEL, MD;  Location: [facility] OR;  Service: Open Heart Surgery;  Laterality: N/A;   DIRECT LARYNGOSCOPY Bilateral 04/20/2023   Procedure: LARYNGOSCOPY, MICRO DIRECT WITH PROLARYN INJECTION;  Surgeon: Carlie Clark, MD;  Location: [facility] OR;  Service: ENT;  Laterality: Bilateral;   EP IMPLANTABLE DEVICE N/A 11/02/2015   MDT Adivisa MRI conditional dual chamber pacemaker implanted by Dr Kelsie for complete heart block   FEMUR IM NAIL Left 07/01/2022   Procedure: INTRAMEDULLARY (IM) NAIL FEMORAL LEFT ANTEGRADE;  Surgeon: Celena Ozell, MD;  Location: [facility] OR;  Service: Orthopedics;  Laterality: Left;   IR IMAGING GUIDED PORT INSERTION  01/02/2023   KNEE SURGERY Right 1972   LEAD EXTRACTION N/A 10/20/2022   Procedure: LEAD EXTRACTION;  Surgeon: Waddell Danelle ORN, MD;  Location: [facility];  Service: Cardiovascular;  Laterality: N/A;   LUMBAR LAMINECTOMY  1996   MITRAL VALVE REPLACEMENT N/A 01/27/2018  Procedure: MITRAL VALVE (MV) REPLACEMENT using Magna Mitral Ease Valve Size ;  Surgeon: Dusty Sudie DEL, MD;  Location: [facility] OR;  Service: Open Heart Surgery;  Laterality: N/A;   RIGHT/LEFT HEART CATH AND CORONARY ANGIOGRAPHY N/A 12/02/2017   Procedure: RIGHT/LEFT HEART CATH AND CORONARY ANGIOGRAPHY;  Surgeon: Dann Candyce RAMAN, MD;  Location: [facility] INVASIVE CV LAB;  Service: Cardiovascular;  Laterality: N/A;   TEE WITHOUT CARDIOVERSION N/A 12/09/2017   Procedure: TRANSESOPHAGEAL ECHOCARDIOGRAM (TEE);  Surgeon: Lonni Slain, MD;  Location: [facility] ENDOSCOPY;  Service: Cardiovascular;  Laterality: N/A;   TEE WITHOUT CARDIOVERSION N/A 01/27/2018   Procedure: TRANSESOPHAGEAL ECHOCARDIOGRAM (TEE);  Surgeon: Dusty Sudie DEL, MD;  Location: [facility] OR;  Service: Open Heart Surgery;  Laterality: N/A;   VIDEO BRONCHOSCOPY WITH RADIAL ENDOBRONCHIAL ULTRASOUND  12/02/2022   Procedure: VIDEO BRONCHOSCOPY WITH RADIAL ENDOBRONCHIAL ULTRASOUND;  Surgeon: Shelah Lamar RAMAN, MD;  Location: [facility] ENDOSCOPY;  Service: Pulmonary;;   Social History   Socioeconomic History   Marital status: Married    Spouse name: Not on file   Number of children: 3   Years of education: Masters   Highest education level: Not on file   Occupational History   Occupation: lawyer    Comment: [employer]  Tobacco Use   Smoking status: Never   Smokeless tobacco: Never  Vaping Use   Vaping status: Never Used  Substance and Sexual Activity   Alcohol use: Yes    Alcohol/week: 3.0 standard drinks of alcohol    Types: 3 Glasses of wine per week    Comment: one every week for 3 months   Drug use: No   Sexual activity: Not on file  Other Topics Concern   Not on file  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[place]   Social Drivers of Health   Tobacco Use: Low Risk  (02/05/2024)   Received from [facility]   Patient History    Smoking Tobacco Use: Never    Smokeless Tobacco Use: Never    Passive Exposure: Not on file  Financial Resource Strain: Not on file  Food Insecurity: No Food Insecurity (10/20/2022)   Hunger Vital Sign    Worried About Running Out of Food in the Last Year: Never true    Ran Out of Food in the Last Year: Never true  Transportation Needs: No Transportation Needs (10/20/2022)   PRAPARE - Administrator, Civil Service (Medical): No    Lack of Transportation (Non-Medical): No  Physical Activity: Not on file  Stress: Not on file  Social Connections: Not on file  Intimate Partner Violence: Not At Risk (10/20/2022)   Humiliation, Afraid, Rape, and Kick questionnaire    Fear of Current or Ex-Partner: No    Emotionally Abused: No    Physically Abused: No    Sexually Abused: No  Depression (PHQ2-9): Low Risk (02/23/2024)   Depression (PHQ2-9)    PHQ-2 Score: 0  Alcohol Screen: Not on file  Housing: Unknown (02/05/2024)   Received [facility]   Epic    Unable to Pay for Housing in the Last Year: Not on file    Number of Times Moved in the Last Year: Not on file    At any time in the past 12 months, were you homeless or living in a shelter (including now)?: No  Utilities: Not At Risk (10/20/2022)   [facility] Utilities     Threatened with loss of  utilities: No  Health Literacy: Not on file   Current Outpatient Medications on File Prior to Visit  Medication Sig Dispense Refill   aspirin  EC 81 MG tablet Take 81 mg by mouth daily.     atorvastatin  (LIPITOR ) 40 MG tablet Take 1 tablet (40 mg total) by mouth daily. 90 tablet 3   Cholecalciferol  125 MCG (5000 UT) TABS Take 1 tablet by mouth daily. 30 tablet 6   furosemide  (LASIX ) 40 MG tablet Take 1 tablet (40 mg total) by mouth daily as needed for weight gain or dyspnea 30 tablet 2   levothyroxine  (SYNTHROID ) 75 MCG tablet Take 1 tablet (75 mcg total) by mouth daily before breakfast. 90 tablet 3   losartan  (COZAAR ) 25 MG tablet Take 1/2 tablet (12.5 mg total) by mouth at bedtime. 45 tablet 3   tamsulosin  (FLOMAX ) 0.4 MG CAPS capsule Take 1 capsule (0.4 mg total) by mouth daily. 90 capsule 3   tamsulosin  (FLOMAX ) 0.4 MG CAPS capsule Take 1 capsule (0.4 mg total) by mouth 2 (two) times daily. 180 capsule 3   tamsulosin  (FLOMAX ) 0.4 MG CAPS capsule Take 1 capsule (0.4 mg total) by mouth 2 (two) times daily. 90 capsule 2   traZODone  (DESYREL ) 50 MG tablet Take 1 tablet (50 mg total) by mouth at bedtime as needed for sleep. 90 tablet 3   No current facility-administered medications on file prior to visit.   Allergies  Allergen Reactions   Other     Pt reported allergic to dust mites that causes of sneezing, runny nose   Family History  Problem Relation Age of Onset   Heart disease Mother    Heart disease Father    Prostate cancer Father 38   Bladder Cancer Father 28   Stomach cancer Paternal Aunt    Colon cancer Neg Hx    PE: BP 120/82   Pulse 60   Ht 5' 11 (1.803 m)   Wt 187 lb 12.8 oz (85.2 kg)   SpO2 98%   BMI 26.19 kg/m  Wt Readings from Last 15 Encounters:  03/01/24 187 lb 12.8 oz (85.2 kg)  02/23/24 167 lb (75.8 kg)  02/02/24 185 lb (83.9 kg)  01/12/24 188 lb (85.3 kg)  12/22/23 189 lb (85.7 kg)  12/01/23 189 lb (85.7 kg)  11/10/23 186 lb  (84.4 kg)  10/19/23 185 lb 12.8 oz (84.3 kg)  09/28/23 184 lb 14.4 oz (83.9 kg)  09/17/23 184 lb 6.4 oz (83.6 kg)  09/08/23 182 lb (82.6 kg)  08/20/23 180 lb 9.6 oz (81.9 kg)  08/17/23 180 lb 6.4 oz (81.8 kg)  07/27/23 176 lb 9.6 oz (80.1 kg)  07/07/23 167 lb 14.4 oz (76.2 kg)  11/17/2022: Weight 177 pounds  Constitutional: Normal weight, in NAD Eyes:  EOMI, no exophthalmos ENT: no neck masses, no cervical lymphadenopathy Cardiovascular: RRR, No MRG Respiratory: CTA B Musculoskeletal: no deformities Skin:no rashes Neurological: no tremor with outstretched hands  ASSESSMENT: 1.  Acquired hypothyroidism  PLAN:  1. Patient with longstanding hypothyroidism, on levothyroxine  therapy - latest thyroid  labs reviewed with pt. >> TSH was slightly elevated 1 week ago: Lab Results  Component Value Date   TSH 5.950 (H) 02/23/2024  - he was on LT4 75 mcg daily previously, but he does mention that after the above results returned, he increased the LT4 dose by half a tablet a day.  We discussed at today's visit that this may be too much and advised him to switch to 100 mcg daily  -  pt feels good on this dose, without complaints. - we discussed about taking the thyroid  hormone every day, with water, >30 minutes before breakfast, separated by >4 hours from acid reflux medications, calcium , iron, multivitamins. Pt. is taking it correctly.  He was previously taking it in the middle of the night but I advised him to move it after 3 AM if possible.  At last visit he was taking it at 7 AM with a multivitamins taken just 1 to 2 hours from breakfast and I advised him to move these at least 4 hours later.  He is not taking his supplements at least 4 hours later. - will check thyroid  tests in 1.5 months: TSH and fT4 - I we will see him back in a year but possibly sooner for labs  I printed his labs, but he does get labs regularly at the cancer center and will send these to me: Orders Placed This Encounter   Procedures   TSH   T4, free   Requested Prescriptions   Signed Prescriptions Disp Refills   levothyroxine  (SYNTHROID ) 100 MCG tablet 45 tablet 6    Sig: Take 1 tablet (100 mcg total) by mouth daily.   Lela Fendt, MD PhD [facility] Endocr[facility]ology

## 2024-03-03 NOTE — Progress Notes (Unsigned)
 "   Cardiology Clinic Note   Patient Name: Tyler Hendrix Date of Encounter: 03/03/2024  Primary Care Provider:  [name] LABOR, MD Primary Cardiologist:  Vinie JAYSON Maxcy, MD  Patient Profile    Tyler Hendrix 69 year old male presents to the clinic today for follow-up evaluation of his coronary artery disease hyperlipidemia, paroxysmal atrial fibrillation, and preoperative cardiac evaluation.  Past Medical History    Past Medical History:  Diagnosis Date   Abnormal TSH    Arthritis    ddd   Atrial fibrillation and flutter (HCC)    s/p DCCV 03/26/2018   CAD in native artery    a. 11/2015: s/p 2 vessel PCI of the first diagonal branch of the LAD in the mid left circumflex with DES. // s/p CABG 01/2018   Chronic combined systolic and diastolic CHF (congestive heart failure) (HCC)    sees Dr. Kelsie    Complete heart block (HCC)    a. s/p Medtronic ppm 10/2015.   Dyspnea    with exertion   Family history of bladder cancer    Family history of prostate cancer    Family history of stomach cancer    History of radiation therapy 2002   Hodgkin disease (HCC) 2002   a. s/p chest radiation and chemotherapy   Hypertension    Hypothyroidism    sees Dr. Trixie    Incidental pulmonary nodule 01/19/2018   Left apical opacity - possible scarring   Inguinal hernia 10/14/2022   bilateral   Mitral regurgitation    s/p bioprosthetic MV replacement 01/2018   Non-small cell lung cancer (NSCLC) (HCC) 11/2022   Pacemaker 10/2015   Medtronic   PVC's (premature ventricular contractions)    Right bundle branch block    S/P CABG x 2 01/27/2018   LIMA to LAD, SVG to RCA, EVH via right thigh   S/P mitral valve replacement with bioprosthetic valve 01/27/2018   29 mm [facility] Mitral stented bovine pericardial tissue valve   Past Surgical History:  Procedure Laterality Date   A-FLUTTER ABLATION N/A 06/20/2021   Procedure: A-FLUTTER ABLATION;  Surgeon: Waddell Danelle ORN, MD;  Location:  [facility] INVASIVE CV LAB;  Service: Cardiovascular;  Laterality: N/A;   BIV UPGRADE N/A 02/08/2019   Procedure: UPGRADE TO BIV PPM;  Surgeon: Kelsie Agent, MD;  Location: [facility] INVAS[facility] CV LAB;  Service: Cardiovascular;  Laterality: N/A;   BRONCHIAL BIOPSY  12/02/2022   Procedure: BRONCHIAL BIOPSIES;  Surgeon: Shelah Lamar RAMAN, MD;  Location: [facility] ENDOSCOPY;  Service: Pulmonary;;   BRONCHIAL BRUSHINGS  12/02/2022   Procedure: BRONCHIAL BRUSHINGS;  Surgeon: Shelah Lamar RAMAN, MD;  Location: [facility] ENDOSCOPY;  Service: Pulmonary;;   BRONCHIAL NEEDLE ASPIRATION BIOPSY  12/02/2022   Procedure: BRONCHIAL NEEDLE ASPIRATION BIOPSIES;  Surgeon: Shelah Lamar RAMAN, MD;  Location: [facility] ENDOSCOPY;  Service: Pulmonary;;   BRONCHIAL WASHINGS  12/02/2022   Procedure: BRONCHIAL WASHINGS;  Surgeon: Shelah Lamar RAMAN, MD;  Location: [facility] ENDOSCOPY;  Service: Pulmonary;;   CARDIAC CATHETERIZATION N/A 11/02/2015   Procedure: Left Heart Cath and Coronary Angiography;  Surgeon: Ozell Fell, MD;  Location: [facility] INVASIVE CV LAB;  Service: Cardiovascular;  Laterality: N/A;   CARDIAC CATHETERIZATION N/A 11/21/2015   Procedure: Coronary Stent Intervention;  Surgeon: Ozell Fell, MD;  Location: [facility] INVASIVE CV LAB;  Service: Cardiovascular;  Laterality: N/A;   CARDIAC CATHETERIZATION  11/2017   CARDIOVERSION N/A 03/26/2018   Procedure: CARDIOVERSION;  Surgeon: Loni Soyla LABOR, MD;  Location: [facility] ENDOSCOPY;  Service: Cardiovascular;  Laterality: N/A;   COLONOSCOPY  01/16/2015   per Dr. Teressa, benign polyps, repeat in 10 yrs    CORONARY ARTERY BYPASS GRAFT N/A 01/27/2018   Procedure: CORONARY ARTERY BYPASS GRAFTING (CABG) x two, using left internal mammary artery and right leg greater saphenous vein harvested endoscopically;  Surgeon: Dusty Sudie DEL, MD;  Location: [facility] OR;  Service: Open Heart Surgery;  Laterality: N/A;   DIRECT LARYNGOSCOPY Bilateral 04/20/2023   Procedure: LARYNGOSCOPY, MICRO DIRECT WITH PROLARYN INJECTION;  Surgeon: Carlie Clark, MD;  Location: [facility] OR;  Service: ENT;  Laterality: Bilateral;   EP IMPLANTABLE DEVICE N/A 11/02/2015   MDT Adivisa MRI conditional dual chamber pacemaker implanted by Dr Kelsie for complete heart block   FEMUR IM NAIL Left 07/01/2022   Procedure: INTRAMEDULLARY (IM) NAIL FEMORAL LEFT ANTEGRADE;  Surgeon: Celena Sharper, MD;  Location: [facility] OR;  Service: Orthopedics;  Laterality: Left;   IR IMAGING GUIDED PORT INSERTION  01/02/2023   KNEE SURGERY Right 1972   LEAD EXTRACTION N/A 10/20/2022   Procedure: LEAD EXTRACTION;  Surgeon: Waddell Danelle ORN, MD;  Location: [facility] CV LAB;  Service: Cardiovascular;  Laterality: N/A;   LUMBAR LAMINECTOMY  1996   MITRAL VALVE REPLACEMENT N/A 01/27/2018   Procedure: MITRAL VALVE (MV) REPLACEMENT using Magna Mitral Ease Valve Size ;  Surgeon: Dusty Sudie DEL, MD;  Location: [facility] OR;  Service: Open Heart Surgery;  Laterality: N/A;   RIGHT/LEFT HEART CATH AND CORONARY ANGIOGRAPHY N/A 12/02/2017   Procedure: RIGHT/LEFT HEART CATH AND CORONARY ANGIOGRAPHY;  Surgeon: Dann Candyce RAMAN, MD;  Location: [facility] INVASIVE CV LAB;  Service: Cardiovascular;  Laterality: N/A;   TEE WITHOUT CARDIOVERSION N/A 12/09/2017   Procedure: TRANSESOPHAGEAL ECHOCARDIOGRAM (TEE);  Surgeon: Lonni Slain, MD;  Location: [facility] ENDOSCOPY;  Service: Cardiovascular;  Laterality: N/A;   TEE WITHOUT CARDIOVERSION N/A 01/27/2018   Procedure: TRANSESOPHAGEAL ECHOCARDIOGRAM (TEE);  Surgeon: Dusty Sudie DEL, MD;  Location: [facility] OR;  Service: Open Heart Surgery;  Laterality: N/A;   VIDEO BRONCHOSCOPY WITH RADIAL ENDOBRONCHIAL ULTRASOUND  12/02/2022   Procedure: VIDEO BRONCHOSCOPY WITH RADIAL ENDOBRONCHIAL ULTRASOUND;  Surgeon: Shelah Lamar RAMAN, MD;  Location: [facility] ENDOSCOPY;  Service: Pulmonary;;    Allergies  Allergies[1]  History of Present Illness    Tyler Hendrix has a PMH of severe mitral regurgitation, coronary artery disease status post CABG in 2019 with bovine mitral valve  replacement, Hodgkin's lymphoma, recipient of chest wall radiation, palpitations, complete heart block status post PPM, and hyperlipidemia.  He has followed with general cardiology, advanced heart failure team, and EP on a regular basis.  He was last seen by Dr. Mona on 04/17/2023.  During that time he reported that he had recently been seen and evaluated by Dr. Waddell.  He had a cardiac lead repositioned.  He underwent intraoperative TEE 9/24 which showed LVEF of 30-35% and global hypokinesis.  He reported that things have been very difficult for him.  He had an accident and had broken his left femur.  He underwent surgery.  Postoperatively he developed a progressive cough and shortness of breath.  He was thought to have possible pneumonia.  However, he ultimately underwent CT scan which showed probable lung cancer with metastasis.  He underwent bronchoscopy but his sample was not conclusive.  He then had biopsy of his adrenal gland which showed probable metastasis based on a PET scan.  He was started on Keytruda .  He reported significant improvement in his cancer on his January 2025 scan.  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  He was scheduled for laryngoscopy with Dr. Carlie.  He presents to the clinic today for follow-up evaluation and preoperative cardiac evaluation.  He states***.  *** denies chest pain, shortness of breath, lower extremity edema, fatigue, palpitations, melena, hematuria, hemoptysis, diaphoresis, weakness, presyncope, syncope, orthopnea, and PND.  Coronary artery disease-no chest pain today.  Underwent CABG in 2019 with bovine mitral valve replacement.  Denies exertional chest discomfort. Heart healthy low-sodium diet Continue aspirin , atorvastatin , losartan   Hyperlipidemia-LDL***. High-fiber diet Continue aspirin , atorvastatin   Nonischemic cardiomyopathy-denies increased fatigue or activity intolerance.  Following with  advanced heart failure clinic.  Weight stable.  Appears well compensated. Continue furosemide , losartan   Complete heart block-heart rate today***.  Status post PPM placement in the setting of complete heart block.  LV lead turned back on by Dr. Waddell 10/20/2022 Continues to follow with EP  Palpitations-EKG today shows***.  Denies recent episodes of lightheadedness, presyncope or syncope.  Does note occasional palpitations. Avoid triggers caffeine, chocolate, EtOH, dehydration excetra. Continue to monitor  Essential hypertension-BP today***. Maintain blood pressure log Continue losartan    Preoperative cardiac evaluation-Hernia repair surgery   Date of Surgery:  Clearance TBD                                  Surgeon:  Mitzie Peers     Surgeon's Group or Practice Name:  [facility] Phone number:  [phone] Fax number:  [phone]    Primary Cardiologist: Vinie JAYSON Maxcy, MD  Chart reviewed as part of pre-operative protocol coverage. Given past medical history and time since last visit, based on ACC/AHA guidelines, Tyler Hendrix would be at acceptable risk for the planned procedure without further cardiovascular testing.   Patient was advised that if he/she*** develops new symptoms prior to surgery to contact our office to arrange a follow-up appointment.  He verbalized understanding.  His RCRI is moderate risk, 6.6% risk of major cardiac event.  He is able to complete greater than 4 METS of physical activity.  Regarding ASA therapy, we recommend continuation of ASA throughout the perioperative period. However, if the surgeon feels that cessation of ASA is required in the perioperative period, it may be stopped 5-7 days prior to surgery with a plan to resume it as soon as felt to be feasible from a surgical standpoint in the post-operative period.   I will route this recommendation to the requesting party via Epic fax function and remove from pre-op  pool.    This position: Follow-up with Dr. Maxcy or me in 6-9 months.   Home Medications    Prior to Admission medications  Medication Sig Start Date End Date Taking? Authorizing Provider  aspirin  EC 81 MG tablet Take 81 mg by mouth daily. 11/16/18   [provider]  atorvastatin  (LIPITOR ) 40 MG tablet Take 1 tablet (40 mg total) by mouth daily. 08/24/23   [name] LABOR, MD  Cholecalciferol  125 MCG (5000 UT) TABS Take 1 tablet by mouth daily. 07/03/22   Deward Eck, PA-C  furosemide  (LASIX ) 40 MG tablet Take 1 tablet (40 mg total) by mouth daily as needed for weight gain or dyspnea 10/08/23   Shawnee Lynwood Lenis, MD  levothyroxine  (SYNTHROID ) 100 MCG tablet Take 1 tablet (100 mcg total) by mouth daily. 03/01/24   Trixie File, MD  losartan  (COZAAR ) 25 MG tablet Take 1/2 tablet (12.5  mg total) by mouth at bedtime. 09/17/23   Zenaida Morene PARAS, MD  tamsulosin  (FLOMAX ) 0.4 MG CAPS capsule Take 1 capsule (0.4 mg total) by mouth daily. 03/30/23     tamsulosin  (FLOMAX ) 0.4 MG CAPS capsule Take 1 capsule (0.4 mg total) by mouth 2 (two) times daily. 10/06/23     tamsulosin  (FLOMAX ) 0.4 MG CAPS capsule Take 1 capsule (0.4 mg total) by mouth 2 (two) times daily. 02/16/24     traZODone  (DESYREL ) 50 MG tablet Take 1 tablet (50 mg total) by mouth at bedtime as needed for sleep. 08/20/23   [name] LABOR, MD    Family History    Family History  Problem Relation Age of Onset   Heart disease Mother    Heart disease Father    Prostate cancer Father 84   Bladder Cancer Father 33   Stomach cancer Paternal Aunt    Colon cancer Neg Hx    He indicated that his mother is deceased. He indicated that his father is deceased. He indicated that his sister is alive. He indicated that his brother is alive. He indicated that his maternal grandmother is deceased. He indicated that his maternal grandfather is deceased. He indicated that his paternal grandmother is deceased. He indicated that his paternal  grandfather is deceased. He indicated that his maternal aunt is alive. He indicated that his maternal uncle is deceased. He indicated that both of his paternal aunts are deceased. He indicated that the status of his neg hx is unknown. He indicated that the status of his other is unknown.  Social History    Social History   Socioeconomic History   Marital status: Married    Spouse name: Not on file   Number of children: 3   Years of education: Masters   Highest education level: Not on file  Occupational History   Occupation: lawyer    Comment: [employer]  Tobacco Use   Smoking status: Never   Smokeless tobacco: Never  Vaping Use   Vaping status: Never Used  Substance and Sexual Activity   Alcohol use: Yes    Alcohol/week: 3.0 standard drinks of alcohol    Types: 3 Glasses of wine per week    Comment: one every week for 3 months   Drug use: No   Sexual activity: Not on file  Other Topics Concern   Not on file  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[place]   Social Drivers of Health   Tobacco Use: Low Risk (03/01/2024)   Patient History    Smoking Tobacco Use: Never    Smokeless Tobacco Use: Never    Passive Exposure: Not on file  Financial Resource Strain: Not on file  Food Insecurity: No Food Insecurity (10/20/2022)   Hunger Vital Sign    Worried About Running Out of Food in the Last Year: Never true    Ran Out of Food in the Last Year: Never true  Transportation Needs: No Transportation Needs (10/20/2022)   PRAPARE - Administrator, Civil Service (Medical): No    Lack of Transportation (Non-Medical): No  Physical Activity: Not on file  Stress: Not on file  Social Connections: Not on file  Intimate Partner Violence: Not At Risk (10/20/2022)   Humiliation, Afraid, Rape, and Kick questionnaire    Fear of Current or Ex-Partner: No    Emotionally Abused: No    Physically Abused: No    Sexually Abused: No  Depression  (PHQ2-9): Low Risk (02/23/2024)   Depression (PHQ2-9)    PHQ-2 Score: 0  Alcohol Screen: Not on file  Housing: Unknown (02/05/2024)   Received from [facility]   Epic    Unable to Pay for Housing in the Last Year: Not on file    Number of Times Moved in the Last Year: Not on file    At any time in the past 12 months, were you homeless or living in a shelter (including now)?: No  Utilities: Not At Risk (10/20/2022)   AHC Utilities    Threatened with loss of utilities: No  Health Literacy: Not on file     Review of Systems    General:  No chills, fever, night sweats or weight changes.  Cardiovascular:  No chest pain, dyspnea on exertion, edema, orthopnea, palpitations, paroxysmal nocturnal dyspnea. Dermatological: No rash, lesions/masses Respiratory: No cough, dyspnea Urologic: No hematuria, dysuria Abdominal:   No nausea, vomiting, diarrhea, bright red blood per rectum, melena, or hematemesis Neurologic:  No visual changes, wkns, changes in mental status. All other systems reviewed and are otherwise negative except as noted above.  Physical Exam    VS:  There were no vitals taken for this visit. , BMI There is no height or weight on file to calculate BMI. GEN: Well nourished, well developed, in no acute distress. HEENT: normal. Neck: Supple, no JVD, carotid bruits, or masses. Cardiac: RRR, no murmurs, rubs, or gallops. No clubbing, cyanosis, edema.  Radials/DP/PT 2+ and equal bilaterally.  Respiratory:  Respirations regular and unlabored, clear to auscultation bilaterally. GI: Soft, nontender, nondistended, BS + x 4. MS: no deformity or atrophy. Skin: warm and dry, no rash. Neuro:  Strength and sensation are intact. Psych: Normal affect.  Accessory Clinical Findings    Recent Labs: 02/23/2024: ALT 20; BUN 18; Creatinine 1.33; Hemoglobin 13.5; Platelet Count 213; Potassium 4.3; Sodium 140; TSH 5.950   Recent Lipid Panel    Component Value Date/Time   CHOL  97 08/20/2023 0942   CHOL 109 05/16/2020 0834   TRIG 76.0 08/20/2023 0942   HDL 34.20 (L) 08/20/2023 0942   HDL 32 (L) 05/16/2020 0834   CHOLHDL 3 08/20/2023 0942   VLDL 15.2 08/20/2023 0942   LDLCALC 47 08/20/2023 0942   LDLCALC 57 05/16/2020 0834   LDLDIRECT 88.6 10/17/2011 0846    No BP recorded.  {Refresh Note OR Click here to enter BP  :1}***    ECG personally reviewed by me today- ***    Echocardiogram 05/07/2023   IMPRESSIONS     1. Left ventricular ejection fraction, by estimation, is 25%. The left  ventricle has severely decreased function. The left ventricle demonstrates  global hypokinesis. The left ventricular internal cavity size was  moderately dilated. Left ventricular  diastolic parameters are indeterminate. The average left ventricular  global longitudinal strain is -15.8 %.   2. Right ventricular systolic function is severely reduced. The right  ventricular size is mildly enlarged.   3. The mitral valve has been repaired/replaced. Trivial mitral valve  regurgitation. Mild mitral stenosis. The mean mitral valve gradient is 4.0  mmHg. Echo findings are consistent with normal structure and function of  the mitral valve prosthesis.   4. The aortic valve is tricuspid. There is mild calcification of the  aortic valve. Aortic valve regurgitation is not visualized. Aortic valve  sclerosis/calcification is present, without any evidence of aortic  stenosis.   5. The inferior vena cava is normal in size  with greater than 50%  respiratory variability, suggesting right atrial pressure of 3 mmHg.   FINDINGS   Left Ventricle: Left ventricular ejection fraction, by estimation, is  25%. The left ventricle has severely decreased function. The left  ventricle demonstrates global hypokinesis. The average left ventricular  global longitudinal strain is -15.8 %. Strain   was performed and the global longitudinal strain is indeterminate. The  left ventricular internal cavity  size was moderately dilated. There is no  left ventricular hypertrophy. Abnormal (paradoxical) septal motion  consistent with post-operative status.  Left ventricular diastolic parameters are indeterminate.   Right Ventricle: The right ventricular size is mildly enlarged. No  increase in right ventricular wall thickness. Right ventricular systolic  function is severely reduced.   Left Atrium: Left atrial size was normal in size.   Right Atrium: Right atrial size was normal in size.   Pericardium: There is no evidence of pericardial effusion.   Mitral Valve: The mitral valve has been repaired/replaced. Trivial mitral  valve regurgitation. There is a 29 mm bioprosthetic valve present in the  mitral position. Echo findings are consistent with normal structure and  function of the mitral valve  prosthesis. Mild mitral valve stenosis. MV peak gradient, 8.2 mmHg. The  mean mitral valve gradient is 4.0 mmHg.   Tricuspid Valve: The tricuspid valve is normal in structure. Tricuspid  valve regurgitation is mild . No evidence of tricuspid stenosis.   Aortic Valve: The aortic valve is tricuspid. There is mild calcification  of the aortic valve. Aortic valve regurgitation is not visualized. Aortic  valve sclerosis/calcification is present, without any evidence of aortic  stenosis.   Pulmonic Valve: The pulmonic valve was normal in structure. Pulmonic valve  regurgitation is mild. No evidence of pulmonic stenosis.   Aorta: The aortic root is normal in size and structure.   Venous: The inferior vena cava is normal in size with greater than 50%  respiratory variability, suggesting right atrial pressure of 3 mmHg.   IAS/Shunts: No atrial level shunt detected by color flow Doppler.   Device interrogation 01/18/2024 A sensed V paced rhythm.  Battery and lead parameters stable with stable capture and sensing.  Device programming appropriate.  Continued remote monitoring recommended.  Dr.  Waddell      Assessment & Plan   1.  ***   Tyler Hendrix. Jamus Loving NP-C     03/03/2024, 8:40 AM [facility] [address] [place], [place] [address] Office [phone]    Notice: This dictation was prepared with Dragon dictation along with smaller phrase technology. Any transcriptional errors that result from this process are unintentional and may not be corrected upon review.   I spent***minutes examining this patient, reviewing medications, and using patient centered shared decision making involving their cardiac care.   I spent  20 minutes reviewing past medical history,  medications, and prior cardiac tests.     [1]  Allergies Allergen Reactions   Other     Pt reported allergic to dust mites that causes of sneezing, runny nose   "

## 2024-03-07 ENCOUNTER — Other Ambulatory Visit (HOSPITAL_COMMUNITY): Payer: Self-pay

## 2024-03-07 ENCOUNTER — Other Ambulatory Visit: Payer: Self-pay

## 2024-03-07 ENCOUNTER — Ambulatory Visit: Admitting: General Practice

## 2024-03-07 ENCOUNTER — Encounter: Payer: Self-pay | Admitting: Internal Medicine

## 2024-03-08 ENCOUNTER — Other Ambulatory Visit (HOSPITAL_COMMUNITY): Payer: Self-pay

## 2024-03-14 ENCOUNTER — Encounter: Payer: Self-pay | Admitting: Internal Medicine

## 2024-03-15 ENCOUNTER — Inpatient Hospital Stay: Attending: Physician Assistant

## 2024-03-15 ENCOUNTER — Inpatient Hospital Stay: Attending: Physician Assistant | Admitting: Internal Medicine

## 2024-03-15 ENCOUNTER — Inpatient Hospital Stay

## 2024-03-15 VITALS — HR 74 | Temp 97.9°F | Resp 17 | Ht 71.0 in | Wt 185.5 lb

## 2024-03-15 DIAGNOSIS — C3492 Malignant neoplasm of unspecified part of left bronchus or lung: Secondary | ICD-10-CM | POA: Diagnosis not present

## 2024-03-15 LAB — CBC WITH DIFFERENTIAL (CANCER CENTER ONLY)
Abs Immature Granulocytes: 0.02 10*3/uL (ref 0.00–0.07)
Basophils Absolute: 0.1 10*3/uL (ref 0.0–0.1)
Basophils Relative: 1 %
Eosinophils Absolute: 0.3 10*3/uL (ref 0.0–0.5)
Eosinophils Relative: 6 %
HCT: 36.9 % — ABNORMAL LOW (ref 39.0–52.0)
Hemoglobin: 12.8 g/dL — ABNORMAL LOW (ref 13.0–17.0)
Immature Granulocytes: 0 %
Lymphocytes Relative: 9 %
Lymphs Abs: 0.5 10*3/uL — ABNORMAL LOW (ref 0.7–4.0)
MCH: 32.6 pg (ref 26.0–34.0)
MCHC: 34.7 g/dL (ref 30.0–36.0)
MCV: 93.9 fL (ref 80.0–100.0)
Monocytes Absolute: 0.3 10*3/uL (ref 0.1–1.0)
Monocytes Relative: 6 %
Neutro Abs: 4.2 10*3/uL (ref 1.7–7.7)
Neutrophils Relative %: 78 %
Platelet Count: 191 10*3/uL (ref 150–400)
RBC: 3.93 MIL/uL — ABNORMAL LOW (ref 4.22–5.81)
RDW: 13.3 % (ref 11.5–15.5)
WBC Count: 5.3 10*3/uL (ref 4.0–10.5)
nRBC: 0 % (ref 0.0–0.2)

## 2024-03-15 LAB — CMP (CANCER CENTER ONLY)
ALT: 20 U/L (ref 0–44)
AST: 27 U/L (ref 15–41)
Albumin: 4.3 g/dL (ref 3.5–5.0)
Alkaline Phosphatase: 77 U/L (ref 38–126)
Anion gap: 10 (ref 5–15)
BUN: 19 mg/dL (ref 8–23)
CO2: 26 mmol/L (ref 22–32)
Calcium: 9.2 mg/dL (ref 8.9–10.3)
Chloride: 102 mmol/L (ref 98–111)
Creatinine: 1.26 mg/dL — ABNORMAL HIGH (ref 0.61–1.24)
GFR, Estimated: >60 mL/min
Glucose, Bld: 148 mg/dL — ABNORMAL HIGH (ref 70–99)
Potassium: 4 mmol/L (ref 3.5–5.1)
Sodium: 138 mmol/L (ref 135–145)
Total Bilirubin: 0.9 mg/dL (ref 0.0–1.2)
Total Protein: 6.7 g/dL (ref 6.5–8.1)

## 2024-03-15 MED ORDER — SODIUM CHLORIDE 0.9 % IV SOLN
200.0000 mg | Freq: Once | INTRAVENOUS | Status: AC
  Administered 2024-03-15: 200 mg via INTRAVENOUS
  Filled 2024-03-15: qty 200

## 2024-03-15 MED ORDER — SODIUM CHLORIDE 0.9 % IV SOLN: INTRAVENOUS | Status: DC

## 2024-03-15 NOTE — Patient Instructions (Signed)

## 2024-03-17 ENCOUNTER — Ambulatory Visit: Admitting: General Practice

## 2024-03-17 ENCOUNTER — Encounter: Payer: Self-pay | Admitting: General Practice

## 2024-03-17 VITALS — BP 124/80 | HR 67 | Ht 71.0 in | Wt 185.0 lb

## 2024-03-17 DIAGNOSIS — Z01818 Encounter for other preprocedural examination: Secondary | ICD-10-CM

## 2024-03-17 DIAGNOSIS — I5022 Chronic systolic (congestive) heart failure: Secondary | ICD-10-CM | POA: Diagnosis not present

## 2024-03-17 DIAGNOSIS — I442 Atrioventricular block, complete: Secondary | ICD-10-CM

## 2024-03-17 DIAGNOSIS — Z951 Presence of aortocoronary bypass graft: Secondary | ICD-10-CM

## 2024-03-17 DIAGNOSIS — I10 Essential (primary) hypertension: Secondary | ICD-10-CM | POA: Diagnosis not present

## 2024-03-17 DIAGNOSIS — R002 Palpitations: Secondary | ICD-10-CM | POA: Diagnosis not present

## 2024-03-17 NOTE — Patient Instructions (Signed)
 Medication Instructions:  Your physician recommends that you continue on your current medications as directed. Please refer to the Current Medication list given to you today. *If you need a refill on your cardiac medications before your next appointment, please call your pharmacy*  Lab Work: NONE ORDERED If you have labs (blood work) drawn today and your tests are completely normal, you will receive your results only by: MyChart Message (if you have MyChart) OR A paper copy in the mail If you have any lab test that is abnormal or we need to change your treatment, we will call you to review the results.  Testing/Procedures: NONE ORDERED  Follow-Up: At [facility], you and your health needs are our priority.  As part of our continuing mission to provide you with exceptional heart care, our providers are all part of one team.  This team includes your primary Cardiologist (physician) and Advanced Practice Providers or APPs (Physician Assistants and Nurse Practitioners) who all work together to provide you with the care you need, when you need it.  Your next appointment:   6 month(s)  Provider:   Vinie JAYSON Maxcy, MD    We recommend signing up for the patient portal called MyChart.  Sign up information is provided on this After Visit Summary.  MyChart is used to connect with patients for Virtual Visits (Telemedicine).  Patients are able to view lab/test results, encounter notes, upcoming appointments, etc.  Non-urgent messages can be sent to your provider as well.   To learn more about what you can do with MyChart, go to [email or web address].   Other Instructions You are cleared for your procedure.

## 2024-03-23 ENCOUNTER — Encounter (HOSPITAL_COMMUNITY): Admission: RE | Admit: 2024-03-23

## 2024-03-30 ENCOUNTER — Ambulatory Visit (HOSPITAL_COMMUNITY): Admission: RE | Admit: 2024-03-30 | Admitting: Surgery

## 2024-03-30 ENCOUNTER — Encounter (HOSPITAL_COMMUNITY): Admission: RE | Payer: Self-pay | Source: Home / Self Care

## 2024-03-30 SURGERY — REPAIR, HERNIA, INGUINAL, ROBOT-ASSISTED, LAPAROSCOPIC, USING MESH
Anesthesia: General | Laterality: Bilateral

## 2024-04-05 ENCOUNTER — Inpatient Hospital Stay

## 2024-04-05 ENCOUNTER — Inpatient Hospital Stay: Admitting: Internal Medicine

## 2024-04-12 ENCOUNTER — Inpatient Hospital Stay

## 2024-04-12 ENCOUNTER — Inpatient Hospital Stay: Admitting: Internal Medicine

## 2024-04-12 ENCOUNTER — Inpatient Hospital Stay: Attending: Physician Assistant

## 2024-05-03 ENCOUNTER — Inpatient Hospital Stay

## 2024-05-03 ENCOUNTER — Inpatient Hospital Stay: Admitting: Internal Medicine

## 2024-05-24 ENCOUNTER — Inpatient Hospital Stay

## 2024-05-24 ENCOUNTER — Inpatient Hospital Stay: Attending: Physician Assistant

## 2024-05-24 ENCOUNTER — Inpatient Hospital Stay: Admitting: Internal Medicine
